# Patient Record
Sex: Female | Born: 1955
Health system: Southern US, Community
[De-identification: ages and names within clinical notes are randomized; demographics above are authoritative.]

## PROBLEM LIST (undated history)

## (undated) DIAGNOSIS — R9431 Abnormal electrocardiogram [ECG] [EKG]: Secondary | ICD-10-CM

## (undated) DIAGNOSIS — G931 Anoxic brain damage, not elsewhere classified: Secondary | ICD-10-CM

## (undated) DIAGNOSIS — F3170 Bipolar disorder, currently in remission, most recent episode unspecified: Secondary | ICD-10-CM

## (undated) DIAGNOSIS — H04129 Dry eye syndrome of unspecified lacrimal gland: Secondary | ICD-10-CM

## (undated) DIAGNOSIS — H3530 Unspecified macular degeneration: Secondary | ICD-10-CM

## (undated) DIAGNOSIS — I5021 Acute systolic (congestive) heart failure: Secondary | ICD-10-CM

## (undated) DIAGNOSIS — I5023 Acute on chronic systolic (congestive) heart failure: Secondary | ICD-10-CM

## (undated) DIAGNOSIS — D72829 Elevated white blood cell count, unspecified: Secondary | ICD-10-CM

## (undated) DIAGNOSIS — S069X9A Unspecified intracranial injury with loss of consciousness of unspecified duration, initial encounter: Secondary | ICD-10-CM

## (undated) DIAGNOSIS — D75839 Thrombocytosis, unspecified: Secondary | ICD-10-CM

## (undated) DIAGNOSIS — I421 Obstructive hypertrophic cardiomyopathy: Secondary | ICD-10-CM

## (undated) DIAGNOSIS — N189 Chronic kidney disease, unspecified: Secondary | ICD-10-CM

## (undated) DIAGNOSIS — J9600 Acute respiratory failure, unspecified whether with hypoxia or hypercapnia: Secondary | ICD-10-CM

## (undated) DIAGNOSIS — M549 Dorsalgia, unspecified: Secondary | ICD-10-CM

## (undated) DIAGNOSIS — I6389 Other cerebral infarction: Secondary | ICD-10-CM

## (undated) DIAGNOSIS — F319 Bipolar disorder, unspecified: Secondary | ICD-10-CM

## (undated) DIAGNOSIS — J9601 Acute respiratory failure with hypoxia: Secondary | ICD-10-CM

## (undated) DIAGNOSIS — I4901 Ventricular fibrillation: Secondary | ICD-10-CM

## (undated) DIAGNOSIS — I10 Essential (primary) hypertension: Secondary | ICD-10-CM

## (undated) DIAGNOSIS — G4733 Obstructive sleep apnea (adult) (pediatric): Secondary | ICD-10-CM

## (undated) DIAGNOSIS — N390 Urinary tract infection, site not specified: Secondary | ICD-10-CM

## (undated) DIAGNOSIS — R269 Unspecified abnormalities of gait and mobility: Secondary | ICD-10-CM

## (undated) DIAGNOSIS — R0600 Dyspnea, unspecified: Secondary | ICD-10-CM

## (undated) DIAGNOSIS — I639 Cerebral infarction, unspecified: Secondary | ICD-10-CM

## (undated) DIAGNOSIS — N179 Acute kidney failure, unspecified: Secondary | ICD-10-CM

## (undated) DIAGNOSIS — I6340 Cerebral infarction due to embolism of unspecified cerebral artery: Secondary | ICD-10-CM

## (undated) DIAGNOSIS — I469 Cardiac arrest, cause unspecified: Secondary | ICD-10-CM

## (undated) HISTORY — DX: Obstructive sleep apnea (adult) (pediatric): G47.33

## (undated) HISTORY — PX: ABDOMINAL HYSTERECTOMY: SHX81

## (undated) HISTORY — DX: Anoxic brain damage, not elsewhere classified: G93.1

## (undated) HISTORY — DX: Dorsalgia, unspecified: M54.9

## (undated) HISTORY — DX: Acute respiratory failure with hypoxia: J96.01

## (undated) HISTORY — DX: Cerebral infarction due to embolism of unspecified cerebral artery: I63.40

## (undated) HISTORY — DX: Acute systolic (congestive) heart failure: I50.21

## (undated) HISTORY — PX: TONSILLECTOMY: SUR1361

## (undated) HISTORY — DX: Bipolar disorder, currently in remission, most recent episode unspecified: F31.70

## (undated) HISTORY — DX: Acute on chronic systolic (congestive) heart failure: I50.23

## (undated) HISTORY — DX: Chronic kidney disease, unspecified: N18.9

## (undated) HISTORY — DX: Acute respiratory failure, unspecified whether with hypoxia or hypercapnia: J96.00

## (undated) HISTORY — DX: Other cerebral infarction: I63.89

## (undated) HISTORY — DX: Urinary tract infection, site not specified: N39.0

## (undated) HISTORY — DX: Unspecified abnormalities of gait and mobility: R26.9

## (undated) HISTORY — DX: Thrombocytosis, unspecified: D75.839

## (undated) HISTORY — DX: Essential (primary) hypertension: I10

## (undated) HISTORY — DX: Elevated white blood cell count, unspecified: D72.829

## (undated) HISTORY — DX: Acute kidney failure, unspecified: N17.9

## (undated) HISTORY — DX: Cerebral infarction, unspecified: I63.9

---

## 1998-06-05 ENCOUNTER — Other Ambulatory Visit: Admission: RE | Admit: 1998-06-05 | Discharge: 1998-06-05 | Payer: Self-pay | Admitting: *Deleted

## 1999-06-06 ENCOUNTER — Other Ambulatory Visit: Admission: RE | Admit: 1999-06-06 | Discharge: 1999-06-06 | Payer: Self-pay | Admitting: *Deleted

## 1999-10-28 ENCOUNTER — Encounter (INDEPENDENT_AMBULATORY_CARE_PROVIDER_SITE_OTHER): Payer: Self-pay

## 1999-10-28 ENCOUNTER — Inpatient Hospital Stay (HOSPITAL_COMMUNITY): Admission: RE | Admit: 1999-10-28 | Discharge: 1999-10-30 | Payer: Self-pay | Admitting: *Deleted

## 1999-12-31 ENCOUNTER — Ambulatory Visit (HOSPITAL_BASED_OUTPATIENT_CLINIC_OR_DEPARTMENT_OTHER): Admission: RE | Admit: 1999-12-31 | Discharge: 1999-12-31 | Payer: Self-pay | Admitting: Urology

## 2000-12-16 ENCOUNTER — Other Ambulatory Visit: Admission: RE | Admit: 2000-12-16 | Discharge: 2000-12-16 | Payer: Self-pay | Admitting: *Deleted

## 2001-12-16 ENCOUNTER — Other Ambulatory Visit: Admission: RE | Admit: 2001-12-16 | Discharge: 2001-12-16 | Payer: Self-pay | Admitting: *Deleted

## 2003-01-24 ENCOUNTER — Other Ambulatory Visit: Admission: RE | Admit: 2003-01-24 | Discharge: 2003-01-24 | Payer: Self-pay | Admitting: *Deleted

## 2004-03-20 ENCOUNTER — Other Ambulatory Visit: Admission: RE | Admit: 2004-03-20 | Discharge: 2004-03-20 | Payer: Self-pay | Admitting: *Deleted

## 2004-03-25 ENCOUNTER — Emergency Department (HOSPITAL_COMMUNITY): Admission: EM | Admit: 2004-03-25 | Discharge: 2004-03-25 | Payer: Self-pay | Admitting: Emergency Medicine

## 2005-06-09 ENCOUNTER — Other Ambulatory Visit: Admission: RE | Admit: 2005-06-09 | Discharge: 2005-06-09 | Payer: Self-pay | Admitting: *Deleted

## 2006-09-02 ENCOUNTER — Other Ambulatory Visit: Admission: RE | Admit: 2006-09-02 | Discharge: 2006-09-02 | Payer: Self-pay | Admitting: Gynecology

## 2007-10-19 ENCOUNTER — Other Ambulatory Visit: Admission: RE | Admit: 2007-10-19 | Discharge: 2007-10-19 | Payer: Self-pay | Admitting: *Deleted

## 2009-07-07 DIAGNOSIS — S069XAA Unspecified intracranial injury with loss of consciousness status unknown, initial encounter: Secondary | ICD-10-CM

## 2009-07-07 DIAGNOSIS — S069X9A Unspecified intracranial injury with loss of consciousness of unspecified duration, initial encounter: Secondary | ICD-10-CM

## 2009-07-07 HISTORY — DX: Unspecified intracranial injury with loss of consciousness of unspecified duration, initial encounter: S06.9X9A

## 2009-07-07 HISTORY — DX: Unspecified intracranial injury with loss of consciousness status unknown, initial encounter: S06.9XAA

## 2009-07-07 HISTORY — PX: SPLENECTOMY, TOTAL: SHX788

## 2010-11-22 NOTE — Op Note (Signed)
[place]  Patient:    [name], Marie Ramos               MRN: [id number] Proc. Date: 12/31/99 Adm. Date:  [id number] Attending:  Lindaann Slough                           Operative Report  PREOPERATIVE DIAGNOSIS:  Recurrent urinary tract infection rule out interstitial cystitis.  POSTOPERATIVE DIAGNOSIS:  Recurrent urinary tract infection.  OPERATION:  Cystoscopy and urethral dilation.  SURGEON:  Lindaann Slough, M.D.  ANESTHESIA:  General.  INDICATIONS:  The patient is a 55 year old female who has been complaining of frequency, hesitancy, ______ pain.  She was treated with antibiotics without any improvement of her symptoms.  She had a hysterectomy about 2 months ago and her symptoms started after the hysterectomy.  She is scheduled now for cystoscopy.  DESCRIPTION OF PROCEDURE:  Under general anesthesia, the patient was prepped and draped and placed in the dorsal lithotomy position.  A #21 Wappler cystoscope was inserted into the bladder.  The bladder mucosa is normal. There is no stone or tumor in the bladder.  The ureteral orifices are in normal position and shape with clear efflux.  There was no evidence of submucosal hemorrhage.  The cystoscope was then removed.  The urethra was dilated with #30 [place].  Then a bimanual pelvic examination was done and there was no evidence of pelvic mass.  The patient tolerated the procedure well and left the operating room in satisfactory condition to the post anesthesia care unit. DD:  12/31/99 TD:  01/01/00 Job: [id number]

## 2015-08-08 DIAGNOSIS — I6340 Cerebral infarction due to embolism of unspecified cerebral artery: Secondary | ICD-10-CM

## 2015-08-08 HISTORY — DX: Cerebral infarction due to embolism of unspecified cerebral artery: I63.40

## 2015-08-24 ENCOUNTER — Encounter (HOSPITAL_COMMUNITY): Payer: Self-pay | Admitting: Cardiovascular Disease

## 2015-08-24 ENCOUNTER — Encounter (HOSPITAL_COMMUNITY)
Admission: EM | Disposition: A | Discharge status: Rehabilitation Facility | Payer: Self-pay | Source: Home / Self Care | Attending: Internal Medicine

## 2015-08-24 ENCOUNTER — Inpatient Hospital Stay (HOSPITAL_COMMUNITY): Payer: BLUE CROSS/BLUE SHIELD

## 2015-08-24 ENCOUNTER — Inpatient Hospital Stay (HOSPITAL_COMMUNITY)
Admission: EM | Admit: 2015-08-24 | Discharge: 2015-09-05 | DRG: 224 | Disposition: A | Discharge status: Rehabilitation Facility | Payer: BLUE CROSS/BLUE SHIELD | Attending: Internal Medicine | Admitting: Internal Medicine

## 2015-08-24 ENCOUNTER — Emergency Department (HOSPITAL_COMMUNITY): Payer: BLUE CROSS/BLUE SHIELD

## 2015-08-24 DIAGNOSIS — E1165 Type 2 diabetes mellitus with hyperglycemia: Secondary | ICD-10-CM | POA: Diagnosis present

## 2015-08-24 DIAGNOSIS — J9600 Acute respiratory failure, unspecified whether with hypoxia or hypercapnia: Secondary | ICD-10-CM

## 2015-08-24 DIAGNOSIS — R05 Cough: Secondary | ICD-10-CM | POA: Diagnosis not present

## 2015-08-24 DIAGNOSIS — I5043 Acute on chronic combined systolic (congestive) and diastolic (congestive) heart failure: Secondary | ICD-10-CM | POA: Diagnosis not present

## 2015-08-24 DIAGNOSIS — B9620 Unspecified Escherichia coli [E. coli] as the cause of diseases classified elsewhere: Secondary | ICD-10-CM | POA: Diagnosis not present

## 2015-08-24 DIAGNOSIS — I4891 Unspecified atrial fibrillation: Secondary | ICD-10-CM | POA: Diagnosis present

## 2015-08-24 DIAGNOSIS — E876 Hypokalemia: Secondary | ICD-10-CM | POA: Diagnosis present

## 2015-08-24 DIAGNOSIS — I248 Other forms of acute ischemic heart disease: Secondary | ICD-10-CM | POA: Diagnosis present

## 2015-08-24 DIAGNOSIS — J9601 Acute respiratory failure with hypoxia: Secondary | ICD-10-CM | POA: Diagnosis present

## 2015-08-24 DIAGNOSIS — I4581 Long QT syndrome: Secondary | ICD-10-CM | POA: Diagnosis present

## 2015-08-24 DIAGNOSIS — F3170 Bipolar disorder, currently in remission, most recent episode unspecified: Secondary | ICD-10-CM | POA: Diagnosis present

## 2015-08-24 DIAGNOSIS — Z79899 Other long term (current) drug therapy: Secondary | ICD-10-CM

## 2015-08-24 DIAGNOSIS — I462 Cardiac arrest due to underlying cardiac condition: Secondary | ICD-10-CM | POA: Diagnosis present

## 2015-08-24 DIAGNOSIS — Z87820 Personal history of traumatic brain injury: Secondary | ICD-10-CM | POA: Diagnosis not present

## 2015-08-24 DIAGNOSIS — I10 Essential (primary) hypertension: Secondary | ICD-10-CM | POA: Diagnosis not present

## 2015-08-24 DIAGNOSIS — D72829 Elevated white blood cell count, unspecified: Secondary | ICD-10-CM | POA: Diagnosis present

## 2015-08-24 DIAGNOSIS — Z978 Presence of other specified devices: Secondary | ICD-10-CM

## 2015-08-24 DIAGNOSIS — I82492 Acute embolism and thrombosis of other specified deep vein of left lower extremity: Secondary | ICD-10-CM | POA: Diagnosis not present

## 2015-08-24 DIAGNOSIS — R5381 Other malaise: Secondary | ICD-10-CM | POA: Diagnosis not present

## 2015-08-24 DIAGNOSIS — E785 Hyperlipidemia, unspecified: Secondary | ICD-10-CM | POA: Diagnosis present

## 2015-08-24 DIAGNOSIS — I472 Ventricular tachycardia: Secondary | ICD-10-CM | POA: Diagnosis present

## 2015-08-24 DIAGNOSIS — R059 Cough, unspecified: Secondary | ICD-10-CM

## 2015-08-24 DIAGNOSIS — F319 Bipolar disorder, unspecified: Secondary | ICD-10-CM | POA: Diagnosis present

## 2015-08-24 DIAGNOSIS — I422 Other hypertrophic cardiomyopathy: Secondary | ICD-10-CM | POA: Diagnosis not present

## 2015-08-24 DIAGNOSIS — I639 Cerebral infarction, unspecified: Secondary | ICD-10-CM | POA: Diagnosis not present

## 2015-08-24 DIAGNOSIS — I5021 Acute systolic (congestive) heart failure: Secondary | ICD-10-CM | POA: Diagnosis not present

## 2015-08-24 DIAGNOSIS — F419 Anxiety disorder, unspecified: Secondary | ICD-10-CM | POA: Diagnosis not present

## 2015-08-24 DIAGNOSIS — I421 Obstructive hypertrophic cardiomyopathy: Secondary | ICD-10-CM | POA: Diagnosis not present

## 2015-08-24 DIAGNOSIS — Z888 Allergy status to other drugs, medicaments and biological substances status: Secondary | ICD-10-CM | POA: Diagnosis not present

## 2015-08-24 DIAGNOSIS — S060XAA Concussion with loss of consciousness status unknown, initial encounter: Secondary | ICD-10-CM | POA: Diagnosis present

## 2015-08-24 DIAGNOSIS — I4901 Ventricular fibrillation: Principal | ICD-10-CM | POA: Diagnosis present

## 2015-08-24 DIAGNOSIS — D7589 Other specified diseases of blood and blood-forming organs: Secondary | ICD-10-CM | POA: Diagnosis present

## 2015-08-24 DIAGNOSIS — Z87891 Personal history of nicotine dependence: Secondary | ICD-10-CM

## 2015-08-24 DIAGNOSIS — Z6834 Body mass index (BMI) 34.0-34.9, adult: Secondary | ICD-10-CM | POA: Diagnosis not present

## 2015-08-24 DIAGNOSIS — I5023 Acute on chronic systolic (congestive) heart failure: Secondary | ICD-10-CM | POA: Diagnosis present

## 2015-08-24 DIAGNOSIS — D473 Essential (hemorrhagic) thrombocythemia: Secondary | ICD-10-CM | POA: Diagnosis present

## 2015-08-24 DIAGNOSIS — E872 Acidosis: Secondary | ICD-10-CM | POA: Diagnosis not present

## 2015-08-24 DIAGNOSIS — Z950 Presence of cardiac pacemaker: Secondary | ICD-10-CM

## 2015-08-24 DIAGNOSIS — N179 Acute kidney failure, unspecified: Secondary | ICD-10-CM | POA: Diagnosis present

## 2015-08-24 DIAGNOSIS — I110 Hypertensive heart disease with heart failure: Secondary | ICD-10-CM | POA: Diagnosis present

## 2015-08-24 DIAGNOSIS — I959 Hypotension, unspecified: Secondary | ICD-10-CM | POA: Diagnosis present

## 2015-08-24 DIAGNOSIS — I82442 Acute embolism and thrombosis of left tibial vein: Secondary | ICD-10-CM | POA: Diagnosis not present

## 2015-08-24 DIAGNOSIS — N390 Urinary tract infection, site not specified: Secondary | ICD-10-CM | POA: Diagnosis not present

## 2015-08-24 DIAGNOSIS — D75839 Thrombocytosis, unspecified: Secondary | ICD-10-CM | POA: Diagnosis present

## 2015-08-24 DIAGNOSIS — G9340 Encephalopathy, unspecified: Secondary | ICD-10-CM | POA: Diagnosis not present

## 2015-08-24 DIAGNOSIS — R7989 Other specified abnormal findings of blood chemistry: Secondary | ICD-10-CM | POA: Diagnosis not present

## 2015-08-24 DIAGNOSIS — I469 Cardiac arrest, cause unspecified: Secondary | ICD-10-CM

## 2015-08-24 DIAGNOSIS — R269 Unspecified abnormalities of gait and mobility: Secondary | ICD-10-CM | POA: Diagnosis not present

## 2015-08-24 DIAGNOSIS — G931 Anoxic brain damage, not elsewhere classified: Secondary | ICD-10-CM

## 2015-08-24 DIAGNOSIS — R9431 Abnormal electrocardiogram [ECG] [EKG]: Secondary | ICD-10-CM | POA: Diagnosis present

## 2015-08-24 DIAGNOSIS — Z959 Presence of cardiac and vascular implant and graft, unspecified: Secondary | ICD-10-CM

## 2015-08-24 DIAGNOSIS — M549 Dorsalgia, unspecified: Secondary | ICD-10-CM | POA: Diagnosis not present

## 2015-08-24 DIAGNOSIS — I6340 Cerebral infarction due to embolism of unspecified cerebral artery: Secondary | ICD-10-CM | POA: Diagnosis not present

## 2015-08-24 DIAGNOSIS — I638 Other cerebral infarction: Secondary | ICD-10-CM | POA: Diagnosis not present

## 2015-08-24 HISTORY — DX: Ventricular fibrillation: I49.01

## 2015-08-24 HISTORY — PX: CARDIAC CATHETERIZATION: SHX172

## 2015-08-24 HISTORY — DX: Abnormal electrocardiogram (ECG) (EKG): R94.31

## 2015-08-24 HISTORY — DX: Dry eye syndrome of unspecified lacrimal gland: H04.129

## 2015-08-24 HISTORY — DX: Unspecified intracranial injury with loss of consciousness of unspecified duration, initial encounter: S06.9X9A

## 2015-08-24 HISTORY — DX: Cardiac arrest, cause unspecified: I46.9

## 2015-08-24 HISTORY — DX: Obstructive hypertrophic cardiomyopathy: I42.1

## 2015-08-24 HISTORY — DX: Bipolar disorder, unspecified: F31.9

## 2015-08-24 HISTORY — DX: Unspecified macular degeneration: H35.30

## 2015-08-24 LAB — GLUCOSE, CAPILLARY
GLUCOSE-CAPILLARY: 120 mg/dL — AB (ref 65–99)
GLUCOSE-CAPILLARY: 129 mg/dL — AB (ref 65–99)
Glucose-Capillary: 133 mg/dL — ABNORMAL HIGH (ref 65–99)
Glucose-Capillary: 101 mg/dL — ABNORMAL HIGH (ref 65–99)
Glucose-Capillary: 87 mg/dL (ref 65–99)
Glucose-Capillary: 92 mg/dL (ref 65–99)

## 2015-08-24 LAB — POCT I-STAT, CHEM 8
BUN: 22 mg/dL — AB (ref 6–20)
BUN: 21 mg/dL — AB (ref 6–20)
BUN: 21 mg/dL — ABNORMAL HIGH (ref 6–20)
CHLORIDE: 108 mmol/L (ref 101–111)
CHLORIDE: 109 mmol/L (ref 101–111)
CREATININE: 0.7 mg/dL (ref 0.44–1.00)
Calcium, Ion: 1.05 mmol/L — ABNORMAL LOW (ref 1.13–1.30)
Calcium, Ion: 1.05 mmol/L — ABNORMAL LOW (ref 1.13–1.30)
Calcium, Ion: 1 mmol/L — ABNORMAL LOW (ref 1.13–1.30)
Chloride: 113 mmol/L — ABNORMAL HIGH (ref 101–111)
Creatinine, Ser: 0.8 mg/dL (ref 0.44–1.00)
Creatinine, Ser: 0.7 mg/dL (ref 0.44–1.00)
GLUCOSE: 139 mg/dL — AB (ref 65–99)
GLUCOSE: 131 mg/dL — AB (ref 65–99)
Glucose, Bld: 98 mg/dL (ref 65–99)
HCT: 40 % (ref 36.0–46.0)
HCT: 44 % (ref 36.0–46.0)
HEMATOCRIT: 41 % (ref 36.0–46.0)
HEMOGLOBIN: 15 g/dL (ref 12.0–15.0)
HEMOGLOBIN: 13.9 g/dL (ref 12.0–15.0)
Hemoglobin: 13.6 g/dL (ref 12.0–15.0)
POTASSIUM: 2.8 mmol/L — AB (ref 3.5–5.1)
POTASSIUM: 4 mmol/L (ref 3.5–5.1)
POTASSIUM: 3.5 mmol/L (ref 3.5–5.1)
SODIUM: 146 mmol/L — AB (ref 135–145)
Sodium: 147 mmol/L — ABNORMAL HIGH (ref 135–145)
Sodium: 147 mmol/L — ABNORMAL HIGH (ref 135–145)
TCO2: 24 mmol/L (ref 0–100)
TCO2: 23 mmol/L (ref 0–100)
TCO2: 23 mmol/L (ref 0–100)

## 2015-08-24 LAB — CBC
HCT: 39 % (ref 36.0–46.0)
Hemoglobin: 12.6 g/dL (ref 12.0–15.0)
MCH: 29.6 pg (ref 26.0–34.0)
MCHC: 32.3 g/dL (ref 30.0–36.0)
MCV: 91.5 fL (ref 78.0–100.0)
PLATELETS: 524 10*3/uL — AB (ref 150–400)
RBC: 4.26 MIL/uL (ref 3.87–5.11)
RDW: 15 % (ref 11.5–15.5)
WBC: 30.9 10*3/uL — ABNORMAL HIGH (ref 4.0–10.5)

## 2015-08-24 LAB — I-STAT CHEM 8, ED
BUN: 27 mg/dL — AB (ref 6–20)
CALCIUM ION: 1.07 mmol/L — AB (ref 1.13–1.30)
CREATININE: 1.2 mg/dL — AB (ref 0.44–1.00)
Chloride: 103 mmol/L (ref 101–111)
GLUCOSE: 270 mg/dL — AB (ref 65–99)
HEMATOCRIT: 44 % (ref 36.0–46.0)
HEMOGLOBIN: 15 g/dL (ref 12.0–15.0)
Potassium: 3.2 mmol/L — ABNORMAL LOW (ref 3.5–5.1)
Sodium: 139 mmol/L (ref 135–145)
TCO2: 21 mmol/L (ref 0–100)

## 2015-08-24 LAB — PROCALCITONIN: Procalcitonin: 0.21 ng/mL

## 2015-08-24 LAB — POCT I-STAT 3, ART BLOOD GAS (G3+)
Acid-base deficit: 7 mmol/L — ABNORMAL HIGH (ref 0.0–2.0)
Bicarbonate: 22 mEq/L (ref 20.0–24.0)
O2 SAT: 99 %
PCO2 ART: 53.4 mmHg — AB (ref 35.0–45.0)
PO2 ART: 193 mmHg — AB (ref 80.0–100.0)
Patient temperature: 35.5
TCO2: 24 mmol/L (ref 0–100)
pH, Arterial: 7.214 — ABNORMAL LOW (ref 7.350–7.450)

## 2015-08-24 LAB — COMPREHENSIVE METABOLIC PANEL
ALBUMIN: 3.5 g/dL (ref 3.5–5.0)
ALT: 73 U/L — ABNORMAL HIGH (ref 14–54)
ANION GAP: 13 (ref 5–15)
AST: 94 U/L — ABNORMAL HIGH (ref 15–41)
Alkaline Phosphatase: 123 U/L (ref 38–126)
BUN: 23 mg/dL — ABNORMAL HIGH (ref 6–20)
CALCIUM: 9.1 mg/dL (ref 8.9–10.3)
CO2: 21 mmol/L — AB (ref 22–32)
Chloride: 105 mmol/L (ref 101–111)
Creatinine, Ser: 1.24 mg/dL — ABNORMAL HIGH (ref 0.44–1.00)
GFR calc non Af Amer: 46 mL/min — ABNORMAL LOW (ref >60)
GFR, EST AFRICAN AMERICAN: 54 mL/min — AB (ref >60)
GLUCOSE: 277 mg/dL — AB (ref 65–99)
POTASSIUM: 3.3 mmol/L — AB (ref 3.5–5.1)
SODIUM: 139 mmol/L (ref 135–145)
Total Bilirubin: 0.4 mg/dL (ref 0.3–1.2)
Total Protein: 6.8 g/dL (ref 6.5–8.1)

## 2015-08-24 LAB — I-STAT TROPONIN, ED: TROPONIN I, POC: 0.11 ng/mL — AB (ref 0.00–0.08)

## 2015-08-24 LAB — BASIC METABOLIC PANEL
Anion gap: 11 (ref 5–15)
BUN: 20 mg/dL (ref 6–20)
CHLORIDE: 112 mmol/L — AB (ref 101–111)
CO2: 22 mmol/L (ref 22–32)
CREATININE: 0.88 mg/dL (ref 0.44–1.00)
Calcium: 7.4 mg/dL — ABNORMAL LOW (ref 8.9–10.3)
GFR calc Af Amer: >60 mL/min (ref >60)
GFR calc non Af Amer: >60 mL/min (ref >60)
GLUCOSE: 141 mg/dL — AB (ref 65–99)
Potassium: 2.8 mmol/L — ABNORMAL LOW (ref 3.5–5.1)
Sodium: 145 mmol/L (ref 135–145)

## 2015-08-24 LAB — CBG MONITORING, ED: Glucose-Capillary: 194 mg/dL — ABNORMAL HIGH (ref 65–99)

## 2015-08-24 LAB — MAGNESIUM: Magnesium: 2.2 mg/dL (ref 1.7–2.4)

## 2015-08-24 LAB — TROPONIN I: Troponin I: 1.16 ng/mL (ref <0.031)

## 2015-08-24 LAB — PROTIME-INR
INR: 1.27 (ref 0.00–1.49)
INR: 1.13 (ref 0.00–1.49)
Prothrombin Time: 16 seconds — ABNORMAL HIGH (ref 11.6–15.2)
Prothrombin Time: 14.7 seconds (ref 11.6–15.2)

## 2015-08-24 LAB — APTT
APTT: 90 s — AB (ref 24–37)
APTT: 25 s (ref 24–37)

## 2015-08-24 LAB — PHOSPHORUS: PHOSPHORUS: 4.1 mg/dL (ref 2.5–4.6)

## 2015-08-24 SURGERY — LEFT HEART CATH AND CORONARY ANGIOGRAPHY

## 2015-08-24 MED ORDER — LIDOCAINE HCL (PF) 1 % IJ SOLN
INTRAMUSCULAR | Status: AC
  Filled 2015-08-24: qty 30

## 2015-08-24 MED ORDER — SODIUM CHLORIDE 0.9 % IV SOLN: INTRAVENOUS | Status: AC

## 2015-08-24 MED ORDER — LIDOCAINE HCL (PF) 1 % IJ SOLN
INTRAMUSCULAR | Status: DC | PRN
Start: 2015-08-24 — End: 2015-08-24
  Administered 2015-08-24: 3 mL via SUBCUTANEOUS

## 2015-08-24 MED ORDER — FENTANYL CITRATE (PF) 100 MCG/2ML IJ SOLN
INTRAMUSCULAR | Status: DC | PRN
  Administered 2015-08-24: 100 ug via INTRAVENOUS

## 2015-08-24 MED ORDER — FENTANYL CITRATE (PF) 100 MCG/2ML IJ SOLN
100.0000 ug | INTRAMUSCULAR | Status: DC | PRN
  Administered 2015-08-24: 100 ug via INTRAVENOUS
  Filled 2015-08-24: qty 2

## 2015-08-24 MED ORDER — IOHEXOL 350 MG/ML SOLN
INTRAVENOUS | Status: DC | PRN
Start: 2015-08-24 — End: 2015-08-24
  Administered 2015-08-24: 40 mL via INTRA_ARTERIAL

## 2015-08-24 MED ORDER — SODIUM CHLORIDE 0.9 % IV SOLN
2000.0000 mL | Freq: Once | INTRAVENOUS | Status: AC
  Administered 2015-08-24: 1000 mL via INTRAVENOUS

## 2015-08-24 MED ORDER — SODIUM CHLORIDE 0.9 % IV SOLN
25.0000 ug/h | INTRAVENOUS | Status: DC
  Administered 2015-08-24: 100 ug/h via INTRAVENOUS
  Administered 2015-08-25 (×2): 300 ug/h via INTRAVENOUS
  Filled 2015-08-24 (×4): qty 50

## 2015-08-24 MED ORDER — MIDAZOLAM BOLUS VIA INFUSION
2.0000 mg | INTRAVENOUS | Status: DC | PRN
  Filled 2015-08-24: qty 2

## 2015-08-24 MED ORDER — HEPARIN (PORCINE) IN NACL 2-0.9 UNIT/ML-% IJ SOLN
INTRAMUSCULAR | Status: AC
  Filled 2015-08-24: qty 1500

## 2015-08-24 MED ORDER — MIDAZOLAM HCL 2 MG/2ML IJ SOLN: 2.0000 mg | Freq: Once | INTRAMUSCULAR | Status: AC

## 2015-08-24 MED ORDER — FENTANYL CITRATE (PF) 100 MCG/2ML IJ SOLN: 100.0000 ug | INTRAMUSCULAR | Status: DC | PRN

## 2015-08-24 MED ORDER — NOREPINEPHRINE BITARTRATE 1 MG/ML IV SOLN
0.0000 ug/min | INTRAVENOUS | Status: DC
  Administered 2015-08-25: 5 ug/min via INTRAVENOUS
  Filled 2015-08-24 (×2): qty 4

## 2015-08-24 MED ORDER — POTASSIUM CHLORIDE 10 MEQ/100ML IV SOLN
10.0000 meq | INTRAVENOUS | Status: AC
  Administered 2015-08-24 (×5): 10 meq via INTRAVENOUS
  Filled 2015-08-24: qty 100

## 2015-08-24 MED ORDER — FENTANYL BOLUS VIA INFUSION
50.0000 ug | INTRAVENOUS | Status: DC | PRN
  Filled 2015-08-24: qty 50

## 2015-08-24 MED ORDER — INSULIN ASPART 100 UNIT/ML ~~LOC~~ SOLN
2.0000 [IU] | SUBCUTANEOUS | Status: DC
  Administered 2015-08-24: 2 [IU] via SUBCUTANEOUS
  Administered 2015-08-28: 4 [IU] via SUBCUTANEOUS
  Administered 2015-08-28: 3 [IU] via SUBCUTANEOUS
  Administered 2015-08-29 (×2): 2 [IU] via SUBCUTANEOUS
  Administered 2015-08-30: 4 [IU] via SUBCUTANEOUS
  Administered 2015-08-31: 6 [IU] via SUBCUTANEOUS
  Administered 2015-08-31 – 2015-09-04 (×4): 2 [IU] via SUBCUTANEOUS
  Administered 2015-09-04: 4 [IU] via SUBCUTANEOUS
  Administered 2015-09-04 – 2015-09-05 (×2): 2 [IU] via SUBCUTANEOUS

## 2015-08-24 MED ORDER — CISATRACURIUM BESYLATE (PF) 200 MG/20ML IV SOLN
1.0000 ug/kg/min | INTRAVENOUS | Status: DC
  Administered 2015-08-24 – 2015-08-25 (×2): 1 ug/kg/min via INTRAVENOUS
  Filled 2015-08-24 (×2): qty 20

## 2015-08-24 MED ORDER — LABETALOL HCL 5 MG/ML IV SOLN
INTRAVENOUS | Status: AC
  Filled 2015-08-24: qty 4

## 2015-08-24 MED ORDER — VERAPAMIL HCL 2.5 MG/ML IV SOLN
INTRAVENOUS | Status: AC
  Filled 2015-08-24: qty 2

## 2015-08-24 MED ORDER — ASPIRIN 300 MG RE SUPP
300.0000 mg | RECTAL | Status: AC
  Administered 2015-08-24: 300 mg via RECTAL
  Filled 2015-08-24: qty 1

## 2015-08-24 MED ORDER — HEPARIN (PORCINE) IN NACL 2-0.9 UNIT/ML-% IJ SOLN
INTRAMUSCULAR | Status: DC | PRN
  Administered 2015-08-24: 1500 mL

## 2015-08-24 MED ORDER — ARTIFICIAL TEARS OP OINT
1.0000 "application " | TOPICAL_OINTMENT | Freq: Three times a day (TID) | OPHTHALMIC | Status: DC
  Administered 2015-08-24 – 2015-08-26 (×6): 1 via OPHTHALMIC
  Filled 2015-08-24: qty 3.5

## 2015-08-24 MED ORDER — CISATRACURIUM BOLUS VIA INFUSION
0.1000 mg/kg | INTRAVENOUS | Status: AC
  Administered 2015-08-24: 9.9 mg via INTRAVENOUS
  Filled 2015-08-24: qty 10

## 2015-08-24 MED ORDER — SODIUM CHLORIDE 0.9 % IV SOLN
INTRAVENOUS | Status: DC
  Administered 2015-08-24: 999 mL/h via INTRAVENOUS

## 2015-08-24 MED ORDER — SODIUM CHLORIDE 0.9 % IV SOLN
INTRAVENOUS | Status: DC
  Administered 2015-08-24 – 2015-08-27 (×5): via INTRAVENOUS

## 2015-08-24 MED ORDER — PANTOPRAZOLE SODIUM 40 MG IV SOLR
40.0000 mg | INTRAVENOUS | Status: DC
  Administered 2015-08-24 – 2015-08-25 (×2): 40 mg via INTRAVENOUS
  Filled 2015-08-24 (×2): qty 40

## 2015-08-24 MED ORDER — ETOMIDATE 2 MG/ML IV SOLN
INTRAVENOUS | Status: AC | PRN
  Administered 2015-08-24: 30 mg via INTRAVENOUS

## 2015-08-24 MED ORDER — HEPARIN SODIUM (PORCINE) 1000 UNIT/ML IJ SOLN
INTRAMUSCULAR | Status: DC | PRN
  Administered 2015-08-24: 5000 [IU] via INTRAVENOUS

## 2015-08-24 MED ORDER — SODIUM CHLORIDE 0.9 % IV SOLN: 250.0000 mL | INTRAVENOUS | Status: DC | PRN

## 2015-08-24 MED ORDER — FENTANYL CITRATE (PF) 100 MCG/2ML IJ SOLN
100.0000 ug | Freq: Once | INTRAMUSCULAR | Status: AC
  Filled 2015-08-24: qty 2

## 2015-08-24 MED ORDER — HEPARIN SODIUM (PORCINE) 1000 UNIT/ML IJ SOLN
INTRAMUSCULAR | Status: AC
  Filled 2015-08-24: qty 1

## 2015-08-24 MED ORDER — SODIUM CHLORIDE 0.9 % IV SOLN
1.0000 mg/h | INTRAVENOUS | Status: DC
  Administered 2015-08-24: 2 mg/h via INTRAVENOUS
  Administered 2015-08-25 – 2015-08-26 (×3): 4 mg/h via INTRAVENOUS
  Filled 2015-08-24 (×4): qty 10

## 2015-08-24 MED ORDER — VERAPAMIL HCL 2.5 MG/ML IV SOLN
INTRAVENOUS | Status: DC | PRN
  Administered 2015-08-24: 16:00:00 via INTRA_ARTERIAL

## 2015-08-24 MED ORDER — LABETALOL HCL 5 MG/ML IV SOLN
INTRAVENOUS | Status: DC | PRN
  Administered 2015-08-24: 20 mg via INTRAVENOUS

## 2015-08-24 MED ORDER — MIDAZOLAM HCL 2 MG/2ML IJ SOLN
2.0000 mg | INTRAMUSCULAR | Status: DC | PRN
  Filled 2015-08-24: qty 2

## 2015-08-24 MED ORDER — SODIUM CHLORIDE 0.9% FLUSH: 3.0000 mL | INTRAVENOUS | Status: DC | PRN

## 2015-08-24 MED ORDER — CISATRACURIUM BOLUS VIA INFUSION
0.0500 mg/kg | INTRAVENOUS | Status: DC | PRN
  Filled 2015-08-24: qty 5

## 2015-08-24 MED ORDER — HEPARIN SODIUM (PORCINE) 5000 UNIT/ML IJ SOLN
5000.0000 [IU] | Freq: Three times a day (TID) | INTRAMUSCULAR | Status: DC
  Administered 2015-08-24 – 2015-09-01 (×25): 5000 [IU] via SUBCUTANEOUS
  Filled 2015-08-24 (×21): qty 1

## 2015-08-24 MED ORDER — SODIUM CHLORIDE 0.9% FLUSH
3.0000 mL | Freq: Two times a day (BID) | INTRAVENOUS | Status: DC
Start: 2015-08-24 — End: 2015-09-04
  Administered 2015-08-24 – 2015-09-02 (×13): 3 mL via INTRAVENOUS

## 2015-08-24 MED ORDER — MIDAZOLAM HCL 2 MG/2ML IJ SOLN
2.0000 mg | INTRAMUSCULAR | Status: DC | PRN
  Administered 2015-08-24: 2 mg via INTRAVENOUS

## 2015-08-24 MED ORDER — MAGNESIUM SULFATE 2 GM/50ML IV SOLN
2.0000 g | Freq: Once | INTRAVENOUS | Status: DC
  Administered 2015-08-24: 2 g via INTRAVENOUS
  Filled 2015-08-24: qty 50

## 2015-08-24 MED ORDER — ROCURONIUM BROMIDE 50 MG/5ML IV SOLN
INTRAVENOUS | Status: AC | PRN
  Administered 2015-08-24: 100 mg via INTRAVENOUS

## 2015-08-24 SURGICAL SUPPLY — 12 items

## 2015-08-24 NOTE — ED Notes (Signed)
Witnessed arrest.  Pt was in [employer] and lost consciousness.  School RN beside her immediately started CPR and placed pt on AED.  Pt was defibed with AED x 2.  When EMS arrived 5 min later pt was PEA 80.  Regained pulses after 1 epi - SVT 180's.  4 syncronized - pt converted to nsr at 360 jules.  CBG 194.  EMS had originally been called for seizure.  Upon arrival pt hypertensive and maintaining airway with nasal trumptes.Andre Lefort.  Pt began posturing with both arms and snoring RR.

## 2015-08-24 NOTE — Progress Notes (Signed)
Attempted arterial line to the left radial artery. Was unable to thread catheter into artery. Pressure was held for five minutes with the two attempts. No hematoma.

## 2015-08-24 NOTE — Progress Notes (Signed)
Attempted arterial line to the left radial artery. Was unable to thread catheter. Got back good blood return. Pressure was held to stop bleeding. No hematoma noted. RN aware

## 2015-08-24 NOTE — ED Notes (Signed)
Pharmacy aware of need for meds.  Pads placed.  Ice packs to be placed after central line.  Husband at bedside to be taken to CCU waiting room.

## 2015-08-24 NOTE — ED Provider Notes (Signed)
CSN: [id number]     Arrival date & time 08/24/15  1301 History   First MD Initiated Contact with Patient 08/24/15 1304     Chief Complaint  Patient presents with  . Cardiac Arrest   HPI   Patient is a 60 year old female who presents via EMS for cardiac arrest. 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 School nurse initially started CPR and place AED. Patient was shocked 2 times at school before EMS arrival. Upon EMS arrival patient in [place]. She is given 1 dose of epi and was synchronized cardioverted 4 times. Blood sugar 194.  Upon arrival patient unresponsive with snoring respirations. She was posturing with both arms. Her respiratory rate was 28 her blood pressure was 157/84.  Level V caveat due to patient's condition of unresponsive.  Past Medical History  Diagnosis Date  . Bipolar 1 disorder ([place])    No past surgical history on file. No family history on file. Social History  Substance Use Topics  . Smoking status: Not on file  . Smokeless tobacco: Not on file  . Alcohol Use: Not on file   OB History    No data available     Review of Systems  Unable to perform ROS: Patient unresponsive    Allergies  Review of patient's allergies indicates not on file.  Home Medications   Prior to Admission medications   Not on File   BP 176/109 mmHg  Pulse 101  Temp(Src) 97.7 F (36.5 C) (Core (Comment))  Resp 16  Wt 98.884 kg  SpO2 100% Physical Exam  Constitutional: She appears well-nourished. She appears distressed.  HENT:  Head: Normocephalic and atraumatic.  Eyes: Conjunctivae are normal. Right eye exhibits no discharge. Left eye exhibits no discharge. No scleral icterus.  Neck: No JVD present. No tracheal deviation present.  Cardiovascular: Normal rate, regular rhythm, normal heart sounds and intact distal pulses.  Exam reveals no friction rub.   No murmur heard. Pulmonary/Chest: She is in respiratory distress. She has no wheezes.   Abdominal: Soft. She exhibits distension. There is no tenderness. There is no rebound and no guarding.  Musculoskeletal: She exhibits no edema.  Lymphadenopathy:    She has no cervical adenopathy.  Neurological: GCS eye subscore is 1. GCS verbal subscore is 2. GCS motor subscore is 3.  Nursing note and vitals reviewed.   ED Course  .Intubation Date/Time: 08/24/2015 2:45 PM Performed by: Vira Blanco Authorized by: Lajean Saver Consent: The procedure was performed in an emergent situation. Indications: respiratory distress Intubation method: video-assisted Patient status: paralyzed (RSI) Preoxygenation: BVM Sedatives: etomidate Paralytic: rocuronium Laryngoscope size: Miller 3 Tube size: 7.5 mm Tube type: cuffed Number of attempts: 2 Ventilation between attempts: BVM Cricoid pressure: no Cords visualized: yes Post-procedure assessment: chest rise,  ETCO2 monitor and CO2 detector Breath sounds: equal and absent over the epigastrium Cuff inflated: yes ETT to lip: 24 cm Tube secured with: ETT holder Chest x-ray interpreted by me. Chest x-ray findings: endotracheal tube too low Tube repositioned: tube repositioned successfully Patient tolerance: Patient tolerated the procedure well with no immediate complications   (including critical care time) Labs Review Labs Reviewed  CBC - Abnormal; Notable for the following:    WBC 30.9 (*)    Platelets 524 (*)    All other components within normal limits  COMPREHENSIVE METABOLIC PANEL - Abnormal; Notable for the following:    Potassium 3.3 (*)    CO2 21 (*)    Glucose, Bld 277 (*)  BUN 23 (*)    Creatinine, Ser 1.24 (*)    AST 94 (*)    ALT 73 (*)    GFR calc non Af Amer 46 (*)    GFR calc Af Amer 54 (*)    All other components within normal limits  I-STAT TROPOININ, ED - Abnormal; Notable for the following:    Troponin i, poc 0.11 (*)    All other components within normal limits  I-STAT CHEM 8, ED - Abnormal; Notable  for the following:    Potassium 3.2 (*)    BUN 27 (*)    Creatinine, Ser 1.20 (*)    Glucose, Bld 270 (*)    Calcium, Ion 1.07 (*)    All other components within normal limits  CBG MONITORING, ED - Abnormal; Notable for the following:    Glucose-Capillary 194 (*)    All other components within normal limits  BLOOD GAS, ARTERIAL  TROPONIN I  TROPONIN I  TROPONIN I  BASIC METABOLIC PANEL  BASIC METABOLIC PANEL  BASIC METABOLIC PANEL  BASIC METABOLIC PANEL  BASIC METABOLIC PANEL  PROTIME-INR  PROTIME-INR  APTT  APTT  BLOOD GAS, ARTERIAL  BLOOD GAS, ARTERIAL    Imaging Review Dg Chest Port 1 View  08/24/2015  CLINICAL DATA:  60 year old female status post cardiac arrest and intubation EXAM: PORTABLE CHEST 1 VIEW COMPARISON:  None FINDINGS: Right mainstem intubation. An orogastric tube is present and is coiled within the stomach. External defibrillator pads project over the chest. Mild cardiomegaly. Pulmonary vascular congestion with mild edema. Probable bibasilar atelectasis. No pneumothorax or large pleural effusion. Slight irregularity of the lateral aspect of several left-sided ribs. Nondisplaced rib fractures not excluded. IMPRESSION: 1. Right mainstem intubation. Recommend withdrawing the endotracheal tube 4.5 cm for more optimal placement above the carina. 2. Cardiomegaly and mild pulmonary vascular congestion suggests CHF. 3. Well-positioned gastric tube. 4. Subtle irregularities the lateral aspect of several left-sided ribs. Nondisplaced fractures are not excluded. These results were called by telephone at the time of interpretation on 08/24/2015 at 1:41 pm to Dr. Lajean Saver , who verbally acknowledged these results. Electronically Signed   By: Jacqulynn Cadet M.D.   On: 08/24/2015 13:41   I have personally reviewed and evaluated these images and lab results as part of my medical decision-making.   EKG Interpretation   Date/Time:  Friday August 24 2015 13:02:53  EST Ventricular Rate:  102 PR Interval:  201 QRS Duration: 112 QT Interval:  407 QTC Calculation: 530 R Axis:   68 Text Interpretation:  Sinus tachycardia Borderline prolonged PR interval  IRBBB and LPFB Prolonged QT interval Non-specific ST-t changes No previous  tracing Confirmed by Ashok Cordia  MD, Lennette Bihari ([id number]) on 08/24/2015 1:23:01 PM      MDM   Final diagnoses:  Cardiac arrest [facility])    Patient is a 60 year old female present to the EMS postcardiac arrest. She was well this morning and collapse at school and had immediate bystander CPR by school nurse. She was shocked twice with school ED before EMS arrival. Upon EMS arrival patient in PDA was given 1 dose of epinephrine. He converted into sinus tach first SVT and was given four successive cardioversions. Upon arrival the patient GCS 5. She had snoring respirations. Her heart rate was 96 to low 100s. Her blood pressure was 157/84.  She was intubated for airway protection and increased respiratory effort.  Differential diagnosis includes arrhythmia versus ACS versus PE.   Pads placed in cooling protocol initiated.  I have discussed with patient's family and updated in clinic care.  Labs troponin 0.11, total count 30.9.  Cardiology intensive care consultation in the emergency department and saw patient at bedside and will admit to ICU.  Discussed case with my attending, Dr. Ashok Cordia.      Vira Blanco, MD 08/24/15 [place], MD 08/25/15 1257

## 2015-08-24 NOTE — Progress Notes (Signed)
   08/24/15 1400  Clinical Encounter Type  Visited With Family  Visit Type ED  Referral From Nurse  [place] (Comment) (Act as liaison for patient's spouse)  Stress Factors  Family Stress Factors Lack of knowledge;Health changes  Tilden Fossa, [place] escorted patient's husband to sub waiting room and informed doctor. Facilitated contacts, provided hospitality. Nurse took husband to look for daughter. Chaplain left another chaplain in charge.

## 2015-08-24 NOTE — ED Notes (Signed)
Ice packs placed.

## 2015-08-24 NOTE — Procedures (Signed)
Central Venous Catheter Insertion Procedure Note Marie Ramos [id number] 1955/08/08  Procedure: Insertion of Central Venous Catheter Indications: Drug and/or fluid administration  Procedure Details Consent: Unable to obtain consent because of emergent medical necessity. Time Out: Verified patient identification, verified procedure, site/side was marked, verified correct patient position, special equipment/implants available, medications/allergies/relevent history reviewed, required imaging and test results available.  Performed Real time ultrasound was used to identify Rt I/J. Maximum sterile technique was used including cap, gloves, gown, hand hygiene, mask and sheet. Skin prep: Chlorhexidine; local anesthetic administered A antimicrobial bonded/coated triple lumen catheter was placed in the right internal jugular vein using the Seldinger technique.  Evaluation Blood flow good Complications: No apparent complications Patient did tolerate procedure well. Chest X-ray ordered to verify placement.  CXR: normal.  Bincy S Varughese 08/24/2015, 4:05 PM

## 2015-08-24 NOTE — Progress Notes (Signed)
EEG completed, results pending. 

## 2015-08-24 NOTE — H&P (Signed)
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  She was in the cafeteria and lost her consciousness. School nurse was available immediately and started CPR and placed her on AED,from which she received 2 shocks.    EMS arrived and found the Patient in PEA arrest..  One shot of epinephrine was given and the patient regained her pulse back, later patient's rhythm converted into SVT. She was shocked and was converted to NSR at 360 joules. Her total estimated time for ROSC was approximately 10 minutes.  PAST MEDICAL HISTORY :  She  has a past medical history of Bipolar 1 disorder ([place]).,DM  PAST SURGICAL HISTORY: She  has no past surgical history on file.  Allergies not on file  No current facility-administered medications on file prior to encounter.   No current outpatient prescriptions on file prior to encounter.    FAMILY HISTORY:  Her has no family status information on file.   SOCIAL HISTORY: She   was a former smoker  REVIEW OF SYSTEMS:   Unable to obtain  SUBJECTIVE:  Unresponsive  VITAL SIGNS: BP 178/111 mmHg  Pulse 93  Temp(Src) 97.3 F (36.3 C) (Core (Comment))  Resp 19  Wt 98.884 kg (218 lb)  SpO2 100%  HEMODYNAMICS:    VENTILATOR SETTINGS: Vent Mode:  [-] PRVC FiO2 (%):  [100 %] 100 % Set Rate:  [16 bmp] 16 bmp Vt Set:  [500 mL] 500 mL PEEP:  [5 cmH20] 5 cmH20 Plateau Pressure:  [26 cmH20] 26 cmH20  INTAKE / OUTPUT:    PHYSICAL EXAMINATION: General:  Morbid obese white female, intubated and sedated on ventilator. Neuro unable to assess due to sedation HEENT:  Normocephalic, atraumatic. Non icteric. No discharge noted Cardiovascular:   *S1,S2,Tachycardia, no mumur, gallop or rub noted. Lungs: Clear bilaterally, with good chest expansion Abdomen: soft, large, with good bowel souns Musculoskeletal:  unable to asses due to sedation Skin:  No rash, petechiae, bruises  noted  LABS:  BMET  Recent Labs Lab 08/24/15 1300 08/24/15 1319  NA 139 139  K 3.3* 3.2*  CL 105 103  CO2 21*  --   BUN 23* 27*  CREATININE 1.24* 1.20*  GLUCOSE 277* 270*    Electrolytes  Recent Labs Lab 08/24/15 1300  CALCIUM 9.1    CBC  Recent Labs Lab 08/24/15 1300 08/24/15 1319  WBC 30.9*  --   HGB 12.6 15.0  HCT 39.0 44.0  PLT 524*  --     Coag's No results for input(s): APTT, INR in the last 168 hours.  Sepsis Markers No results for input(s): LATICACIDVEN, PROCALCITON, O2SATVEN in the last 168 hours.  ABG No results for input(s): PHART, PCO2ART, PO2ART in the last 168 hours.  Liver Enzymes  Recent Labs Lab 08/24/15 1300  AST 94*  ALT 73*  ALKPHOS 123  BILITOT 0.4  ALBUMIN 3.5    Cardiac Enzymes No results for input(s): TROPONINI, PROBNP in the last 168 hours.  Glucose  Recent Labs Lab 08/24/15 1305  GLUCAP 194*    Imaging Ct Head Wo Contrast  08/24/2015  CLINICAL DATA:  Altered mental status.  Patient is intubated. EXAM:  CT HEAD WITHOUT CONTRAST TECHNIQUE: Contiguous axial images were obtained from the base of the skull through the vertex without contrast. COMPARISON:  None FINDINGS: No evidence for acute hemorrhage, mass lesion, midline shift, hydrocephalus or large infarct. Mucosal thickening in the ethmoid air cells. No calvarial fracture. IMPRESSION: No acute intracranial abnormality. Electronically Signed   By: Markus Daft M.D.   On: 08/24/2015 15:24   Dg Chest Portable 1 View  08/24/2015  CLINICAL DATA:  60 year old female status post cardiac arrest. A central line placement and endotracheal tube repositioning. EXAM: PORTABLE CHEST 1 VIEW COMPARISON:  Prior chest x-ray obtained earlier today at  13:25 p.m. FINDINGS: The endotracheal tube has been retracted and is now 2.7 cm above the carina. Right IJ central venous catheter is been placed. The catheter tip overlies the superior cavoatrial junction. Stable cardiomegaly. Slight interval progression of pulmonary edema. Unchanged orogastric tube. Again suspect nondisplaced left rib fractures. IMPRESSION: 1. New right IJ central venous catheter the tip of which overlies the superior cavoatrial junction. No evidence of pneumothorax or other complication. 2. Repositioning of endotracheal tube which is now 2.7 cm above the carina. 3. Slight interval progression of pulmonary edema. Electronically Signed   By: Jacqulynn Cadet M.D.   On: 08/24/2015 15:00   Dg Chest Port 1 View  08/24/2015  CLINICAL DATA:  60 year old female status post cardiac arrest and intubation EXAM: PORTABLE CHEST 1 VIEW COMPARISON:  None FINDINGS: Right mainstem intubation. An orogastric tube is present and is coiled within the stomach. External defibrillator pads project over the chest. Mild cardiomegaly. Pulmonary vascular congestion with mild edema. Probable bibasilar atelectasis. No pneumothorax or large pleural effusion. Slight irregularity of the lateral aspect of several left-sided ribs. Nondisplaced rib fractures not excluded. IMPRESSION: 1. Right mainstem intubation. Recommend withdrawing the endotracheal tube 4.5 cm for more optimal placement above the carina. 2. Cardiomegaly and mild pulmonary vascular congestion suggests CHF. 3. Well-positioned gastric tube. 4. Subtle irregularities the lateral aspect of several left-sided ribs. Nondisplaced fractures are not excluded. These results were called by telephone at the time of interpretation on 08/24/2015 at 1:41 pm to Dr. Lajean Saver , who verbally acknowledged these results. Electronically Signed   By: Jacqulynn Cadet M.D.   On: 08/24/2015 13:41     STUDIES:  CT scan2/17/17> Echo 2/17> EEG 2/17> EKG  2/17>   CULTURES: None  ANTIBIOTICS: None  SIGNIFICANT EVENTS: Cardiac arrest on 08/24/15  LINES/TUBES:  Rt I/J 09/03/15 ET tube 09/03/15>  DISCUSSION: 60 years old female was admitted on 08/24/15 with cardiac arrest ROSC estimated at 10 minutes. To go for emergent left heart cath and hypothermia protocol.  ASSESSMENT / PLAN:  PULMONARY A: Acute Respiratory failure due to cardiac arrest, Pulmonary edema P:   Full vent support ABG  F/o CXR in am Wean FiO2 as able.    CARDIOVASCULAR A:   Cardiopulmonary arrest, shockable rhythm, HYPERTENSIVE P:  Emergent left heart cath Aspirin daily Differ Heparin and othet antiplatelets to Cardiology. Pending cath results If remain hypertensive after sedation will initiate antihypertensives Trend troponin and f/o Echo  RENAL A:   Mild AKI, hypokalemia  P:   Avoid hypotension Replace potassium Continue i/v fluids Follow up with chemistry  GASTROINTESTINAL A:   No acute events P:   PPI for stress ulcer prophylaxis NPO now Start tube feeds tomorrow  HEMATOLOGIC A:   Leukocytosis? Stress response P:  Trend CBC Transfuse per icu protocol  INFECTIOUS A:   none P:   Sputum culture,  procalcitonin,  ENDOCRINE A:   ?DM P:   Monitor BS q4 hours ssi coverage  NEUROLOGIC A:    Acute encephalopathy high risk for Anoxic brain injury due to cardiac arrest P:   RASS goal: -4 Hypothermia protocol in place for 24 hours EEG monitoring   FAMILY  - Updates:   Husband was updated at bedside.  - Inter-disciplinary family meet or Palliative Care meeting due by:  08/31/15   Pulmonary and [place] Pager: [phone]  08/24/2015, 3:32 PM

## 2015-08-24 NOTE — Consult Note (Signed)
CARDIOLOGY CONSULT NOTE  Patient ID: Marie Ramos, MRN: [id number], DOB/AGE: 1956-06-21 60 y.o. Admit date: 08/24/2015 Date of Consult: 08/24/2015  Primary Physician: No primary care provider on file. 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 She was completely unresponsive and was found to be pulseless. The school nurse was present and placed an AED, 2 shocks were delivered. On EMS arrival, PEA was present. ROSC was achieved after 1 mg of epinephrine administered. Post-resuscitation EKG shows no evidence of STEMI. Pt had immediate CPR by report.   Medical History:  Past Medical History  Diagnosis Date  . Bipolar 1 disorder [facility])       Surgical History: No past surgical history on file.   Home Meds: Prior to Admission medications   Not on File    Inpatient Medications:  . artificial tears  1 application Both Eyes 3 times per day  . aspirin  300 mg Rectal NOW  . cisatracurium  0.1 mg/kg Intravenous STAT  . fentaNYL (SUBLIMAZE) injection  100 mcg Intravenous Once  . heparin  5,000 Units Subcutaneous 3 times per day  . midazolam  2 mg Intravenous Once   . sodium chloride    . cisatracurium (NIMBEX) infusion    . fentaNYL infusion INTRAVENOUS    . midazolam (VERSED) infusion    . norepinephrine (LEVOPHED) Adult infusion      Allergies: Allergies not on file  Social History   Social History  . Marital Status: Married    Spouse Name: N/A  . Number of Children: N/A  . Years of Education: N/A   Occupational History  . Not on file.   Social History Main Topics  . Smoking status: Not on file  . Smokeless tobacco: Not on file  . Alcohol Use: Not on file  . Drug Use: Not on file  . Sexual Activity: Not on file   Other Topics Concern  . Not on file   Social  History Narrative  . No narrative on file     Family Hx: negative for CAD or sudden death  Review of Systems: Unable to obtain as patient is unconscious  Physical Exam: Blood pressure 176/109, pulse 101, temperature 97.7 F (36.5 C), temperature source Core (Comment), resp. rate 16, weight 98.884 kg (218 lb), SpO2 100 %. Pt is intubated, unresponsive HEENT: normal Neck: JVP normal. Carotid upstrokes normal without bruits. No thyromegaly. Lungs: equal expansion, clear bilaterally CV: Apex is discrete and nondisplaced, tachy and regular without murmur or gallop Abd: soft, +BS, no bruit, no hepatosplenomegaly Back: midline Ext: no C/C/E        DP/PT pulses intact and =        Spider veins noted Skin: warm and dry without rash Neuro: Unconscious    Labs: No results for input(s): CKTOTAL, CKMB, TROPONINI in the last 72 hours. Lab Results  Component Value Date   WBC 30.9* 08/24/2015   HGB 15.0 08/24/2015   HCT 44.0 08/24/2015   MCV 91.5 08/24/2015   PLT 524* 08/24/2015     Recent Labs Lab 08/24/15 1300 08/24/15 1319  NA 139 139  K 3.3* 3.2*  CL 105 103  CO2 21*  --   BUN 23* 27*  CREATININE 1.24* 1.20*  CALCIUM 9.1  --   PROT 6.8  --  BILITOT 0.4  --   ALKPHOS 123  --   ALT 73*  --   AST 94*  --   GLUCOSE 277* 270*   No results found for: CHOL, HDL, LDLCALC, TRIG No results found for: DDIMER  Radiology/Studies:  Dg Chest Port 1 View  08/24/2015  CLINICAL DATA:  60 year old female status post cardiac arrest and intubation EXAM: PORTABLE CHEST 1 VIEW COMPARISON:  None FINDINGS: Right mainstem intubation. An orogastric tube is present and is coiled within the stomach. External defibrillator pads project over the chest. Mild cardiomegaly. Pulmonary vascular congestion with mild edema. Probable bibasilar atelectasis. No pneumothorax or large pleural effusion. Slight irregularity of the lateral aspect of several left-sided ribs. Nondisplaced rib fractures not  excluded. IMPRESSION: 1. Right mainstem intubation. Recommend withdrawing the endotracheal tube 4.5 cm for more optimal placement above the carina. 2. Cardiomegaly and mild pulmonary vascular congestion suggests CHF. 3. Well-positioned gastric tube. 4. Subtle irregularities the lateral aspect of several left-sided ribs. Nondisplaced fractures are not excluded. These results were called by telephone at the time of interpretation on 08/24/2015 at 1:41 pm to Dr. Lajean Saver , who verbally acknowledged these results. Electronically Signed   By: Jacqulynn Cadet M.D.   On: 08/24/2015 13:41    EKG: sinus tach 102 bpm, prolonged QT interval  Cardiac Studies: pending  ASSESSMENT AND PLAN:  Out-of-hospital VF/PEA arrest: presumed initial rhythm is VF as 2 shocks delivered by AED. While STEMI is not present, pt meets criteria for urgent cardiac catheterization to evaluate for ischemic heart disease as a cause of her arrest. Other possibilities include PE/prolonged QT secondary to psychotropic drugs. Further plans pending cath results.  Limited hx available at the time of this consultation. On my evaluation, the patient is in sinus rhythm and is hemodynamically stable. Discussed with CCM team - plans for hypothermia noted. Further disposition pending cath results.   SignedSherren Mocha MD, [facility] 08/24/2015, 2:40 PM

## 2015-08-24 NOTE — Progress Notes (Signed)
  Echocardiogram 2D Echocardiogram has been performed.  Marie Ramos 08/24/2015, 6:16 PM

## 2015-08-24 NOTE — Progress Notes (Signed)
ETT pulled back 2cm per MD order. Now 22 at lip. RT will continue to monitor.

## 2015-08-24 NOTE — ED Notes (Signed)
Pt with increased snoring rr.

## 2015-08-24 NOTE — Progress Notes (Signed)
ETT pulled back 1cm per NP order. Now 21 at lip. RT will continue to monitor.

## 2015-08-25 DIAGNOSIS — J9601 Acute respiratory failure with hypoxia: Secondary | ICD-10-CM

## 2015-08-25 DIAGNOSIS — G9340 Encephalopathy, unspecified: Secondary | ICD-10-CM

## 2015-08-25 DIAGNOSIS — I4581 Long QT syndrome: Secondary | ICD-10-CM

## 2015-08-25 DIAGNOSIS — R7989 Other specified abnormal findings of blood chemistry: Secondary | ICD-10-CM

## 2015-08-25 LAB — POCT I-STAT, CHEM 8
BUN: 19 mg/dL (ref 6–20)
BUN: 20 mg/dL (ref 6–20)
BUN: 20 mg/dL (ref 6–20)
BUN: 21 mg/dL — AB (ref 6–20)
BUN: 22 mg/dL — ABNORMAL HIGH (ref 6–20)
CHLORIDE: 113 mmol/L — AB (ref 101–111)
CHLORIDE: 114 mmol/L — AB (ref 101–111)
CHLORIDE: 115 mmol/L — AB (ref 101–111)
CREATININE: 0.6 mg/dL (ref 0.44–1.00)
CREATININE: 0.7 mg/dL (ref 0.44–1.00)
Calcium, Ion: 0.98 mmol/L — ABNORMAL LOW (ref 1.13–1.30)
Calcium, Ion: 1.09 mmol/L — ABNORMAL LOW (ref 1.13–1.30)
Calcium, Ion: 1.04 mmol/L — ABNORMAL LOW (ref 1.13–1.30)
Calcium, Ion: 1.02 mmol/L — ABNORMAL LOW (ref 1.13–1.30)
Calcium, Ion: 1.11 mmol/L — ABNORMAL LOW (ref 1.13–1.30)
Chloride: 111 mmol/L (ref 101–111)
Chloride: 119 mmol/L — ABNORMAL HIGH (ref 101–111)
Creatinine, Ser: 0.6 mg/dL (ref 0.44–1.00)
Creatinine, Ser: 0.6 mg/dL (ref 0.44–1.00)
Creatinine, Ser: 0.5 mg/dL (ref 0.44–1.00)
GLUCOSE: 106 mg/dL — AB (ref 65–99)
Glucose, Bld: 103 mg/dL — ABNORMAL HIGH (ref 65–99)
Glucose, Bld: 99 mg/dL (ref 65–99)
Glucose, Bld: 99 mg/dL (ref 65–99)
Glucose, Bld: 94 mg/dL (ref 65–99)
HEMATOCRIT: 36 % (ref 36.0–46.0)
HEMATOCRIT: 36 % (ref 36.0–46.0)
HEMATOCRIT: 36 % (ref 36.0–46.0)
HEMATOCRIT: 39 % (ref 36.0–46.0)
HEMATOCRIT: 40 % (ref 36.0–46.0)
HEMOGLOBIN: 12.2 g/dL (ref 12.0–15.0)
HEMOGLOBIN: 12.2 g/dL (ref 12.0–15.0)
Hemoglobin: 13.3 g/dL (ref 12.0–15.0)
Hemoglobin: 13.6 g/dL (ref 12.0–15.0)
Hemoglobin: 12.2 g/dL (ref 12.0–15.0)
POTASSIUM: 3.4 mmol/L — AB (ref 3.5–5.1)
POTASSIUM: 3.7 mmol/L (ref 3.5–5.1)
POTASSIUM: 4 mmol/L (ref 3.5–5.1)
POTASSIUM: 4 mmol/L (ref 3.5–5.1)
POTASSIUM: 4.2 mmol/L (ref 3.5–5.1)
SODIUM: 142 mmol/L (ref 135–145)
SODIUM: 145 mmol/L (ref 135–145)
SODIUM: 145 mmol/L (ref 135–145)
SODIUM: 147 mmol/L — AB (ref 135–145)
Sodium: 146 mmol/L — ABNORMAL HIGH (ref 135–145)
TCO2: 23 mmol/L (ref 0–100)
TCO2: 23 mmol/L (ref 0–100)
TCO2: 20 mmol/L (ref 0–100)
TCO2: 20 mmol/L (ref 0–100)
TCO2: 19 mmol/L (ref 0–100)

## 2015-08-25 LAB — CBC
HCT: 36.9 % (ref 36.0–46.0)
Hemoglobin: 11.8 g/dL — ABNORMAL LOW (ref 12.0–15.0)
MCH: 29 pg (ref 26.0–34.0)
MCHC: 32 g/dL (ref 30.0–36.0)
MCV: 90.7 fL (ref 78.0–100.0)
PLATELETS: 492 10*3/uL — AB (ref 150–400)
RBC: 4.07 MIL/uL (ref 3.87–5.11)
RDW: 15 % (ref 11.5–15.5)
WBC: 11.3 10*3/uL — ABNORMAL HIGH (ref 4.0–10.5)

## 2015-08-25 LAB — TROPONIN I
TROPONIN I: 0.24 ng/mL — AB (ref <0.031)
Troponin I: 0.86 ng/mL (ref <0.031)
Troponin I: 0.65 ng/mL (ref <0.031)
Troponin I: 0.36 ng/mL — ABNORMAL HIGH (ref <0.031)

## 2015-08-25 LAB — BLOOD GAS, ARTERIAL
ACID-BASE EXCESS: 3 mmol/L — AB (ref 0.0–2.0)
Bicarbonate: 26.6 mEq/L — ABNORMAL HIGH (ref 20.0–24.0)
DRAWN BY: 44166
FIO2: 0.4
MECHVT: 400 mL
O2 SAT: 99.3 %
PATIENT TEMPERATURE: 98.6
PCO2 ART: 38.2 mmHg (ref 35.0–45.0)
PEEP/CPAP: 5 cmH2O
PH ART: 7.458 — AB (ref 7.350–7.450)
PO2 ART: 150 mmHg — AB (ref 80.0–100.0)
RATE: 10 resp/min
TCO2: 27.8 mmol/L (ref 0–100)

## 2015-08-25 LAB — BASIC METABOLIC PANEL
ANION GAP: 10 (ref 5–15)
Anion gap: 9 (ref 5–15)
BUN: 19 mg/dL (ref 6–20)
BUN: 18 mg/dL (ref 6–20)
CHLORIDE: 117 mmol/L — AB (ref 101–111)
CO2: 21 mmol/L — AB (ref 22–32)
CO2: 20 mmol/L — AB (ref 22–32)
CREATININE: 0.67 mg/dL (ref 0.44–1.00)
Calcium: 7.7 mg/dL — ABNORMAL LOW (ref 8.9–10.3)
Calcium: 7.6 mg/dL — ABNORMAL LOW (ref 8.9–10.3)
Chloride: 115 mmol/L — ABNORMAL HIGH (ref 101–111)
Creatinine, Ser: 0.68 mg/dL (ref 0.44–1.00)
GFR calc Af Amer: >60 mL/min (ref >60)
GFR calc non Af Amer: >60 mL/min (ref >60)
GLUCOSE: 104 mg/dL — AB (ref 65–99)
GLUCOSE: 92 mg/dL (ref 65–99)
POTASSIUM: 3.3 mmol/L — AB (ref 3.5–5.1)
Potassium: 3.5 mmol/L (ref 3.5–5.1)
Sodium: 146 mmol/L — ABNORMAL HIGH (ref 135–145)
Sodium: 146 mmol/L — ABNORMAL HIGH (ref 135–145)

## 2015-08-25 LAB — GLUCOSE, CAPILLARY
GLUCOSE-CAPILLARY: 84 mg/dL (ref 65–99)
GLUCOSE-CAPILLARY: 88 mg/dL (ref 65–99)
GLUCOSE-CAPILLARY: 91 mg/dL (ref 65–99)
Glucose-Capillary: 100 mg/dL — ABNORMAL HIGH (ref 65–99)
Glucose-Capillary: 71 mg/dL (ref 65–99)
Glucose-Capillary: 77 mg/dL (ref 65–99)
Glucose-Capillary: 79 mg/dL (ref 65–99)
Glucose-Capillary: 83 mg/dL (ref 65–99)
Glucose-Capillary: 84 mg/dL (ref 65–99)
Glucose-Capillary: 92 mg/dL (ref 65–99)

## 2015-08-25 LAB — PROCALCITONIN: Procalcitonin: 0.58 ng/mL

## 2015-08-25 LAB — MAGNESIUM: MAGNESIUM: 1.9 mg/dL (ref 1.7–2.4)

## 2015-08-25 LAB — MRSA PCR SCREENING: MRSA BY PCR: NEGATIVE

## 2015-08-25 LAB — PHOSPHORUS: Phosphorus: 2.5 mg/dL (ref 2.5–4.6)

## 2015-08-25 MED ORDER — SODIUM CHLORIDE 0.9% FLUSH
10.0000 mL | Freq: Two times a day (BID) | INTRAVENOUS | Status: DC
  Administered 2015-08-25 – 2015-08-28 (×7): 10 mL
  Administered 2015-08-29: 30 mL

## 2015-08-25 MED ORDER — POTASSIUM CHLORIDE 10 MEQ/100ML IV SOLN
10.0000 meq | INTRAVENOUS | Status: AC
  Administered 2015-08-25 (×6): 10 meq via INTRAVENOUS
  Filled 2015-08-25 (×6): qty 100

## 2015-08-25 MED ORDER — SODIUM CHLORIDE 0.9% FLUSH
10.0000 mL | INTRAVENOUS | Status: DC | PRN
Start: 2015-08-25 — End: 2015-09-04

## 2015-08-25 MED ORDER — CHLORHEXIDINE GLUCONATE 0.12% ORAL RINSE (MEDLINE KIT)
15.0000 mL | Freq: Two times a day (BID) | OROMUCOSAL | Status: DC
  Administered 2015-08-25 – 2015-08-27 (×6): 15 mL via OROMUCOSAL

## 2015-08-25 MED ORDER — ANTISEPTIC ORAL RINSE SOLUTION (CORINZ)
7.0000 mL | OROMUCOSAL | Status: DC
  Administered 2015-08-25 – 2015-08-27 (×26): 7 mL via OROMUCOSAL

## 2015-08-25 NOTE — Plan of Care (Signed)
Problem: Phase I Progression Outcomes Goal: Maintain MAP > 85 mmHg Outcome: Completed/Met Date Met:  08/25/15 MAP Goal > 80 Goal: If STEMI, emergency Cardiac Cath Outcome: Not Applicable Date Met:  38/70/65 Left catheterization d/t HTN and cardiac arrest Goal: Electrolytes normal or corrected Outcome: Completed/Met Date Met:  08/25/15 Runs of K

## 2015-08-25 NOTE — Progress Notes (Signed)
Per [employer] protocol, rewarming initiated witnessed x2 RN (Delbert Phenix, RN and Glori Luis, RN).

## 2015-08-25 NOTE — Progress Notes (Signed)
Pt diamond ring with gold band, as well as, pt silver wedding band with flower-vine detail, removed and placed in a biohazard bag and given to husband and son at bedside.

## 2015-08-25 NOTE — Progress Notes (Signed)
Pt admitted with long QTc d/t psychotropic drugs. Discussed lengthening of QTc with NP and stated expected finding of hypothermia pt.

## 2015-08-25 NOTE — Procedures (Signed)
Arterial Catheter Insertion Procedure Note KEILEEN BRIDE [id number] 08-13-1955  Procedure: Insertion of Arterial Catheter  Indications: Blood pressure monitoring  Procedure Details Consent: Unable to obtain consent because of emergent medical necessity. Time Out: Verified patient identification, verified procedure, site/side was marked, verified correct patient position, special equipment/implants available, medications/allergies/relevent history reviewed, required imaging and test results available.  Performed  Maximum sterile technique was used including antiseptics, cap, gloves, gown, hand hygiene, mask and sheet. Skin prep: Chlorhexidine; local anesthetic administered 20 gauge catheter was inserted into left radial artery using the Seldinger technique.  Evaluation Blood flow good; BP tracing good. Complications: No apparent complications   Skin was prep with chlorhexidine. Left Radial arterial line catheter is in place. Good reading and a-line is good for use. Good draw back on the arterial line and line flushes well. Pt was stable throughout procedure. No complications noted.    Leigh Aurora, BS, RCP, RRT 08/25/2015

## 2015-08-25 NOTE — Progress Notes (Signed)
[place] Progress Note Patient Name: Marie Ramos DOB: 11-Jul-1955 MRN: [id number]   Date of Service  08/25/2015  HPI/Events of Note  Hypokalemia  eICU Interventions  Potassium replaced     Intervention Category Intermediate Interventions: Electrolyte abnormality - evaluation and management  [name] 08/25/2015, 2:41 AM

## 2015-08-25 NOTE — Progress Notes (Signed)
Initial Nutrition Assessment  DOCUMENTATION CODES:  Obesity unspecified  INTERVENTION:  If unable to extubate and when medically able, recommend TF. Initiate VHP @ 20 ml/hr via OG/NGT and increase by 10 ml every 4  hours to goal rate of 50 ml/hr.   30 ml Prostat BID.    Tube feeding regimen provides 1400 kcal (100% of needs), 135 grams of protein, and 1003 ml of H2O.   NUTRITION DIAGNOSIS:  Inadequate oral intake related to inability to eat as evidenced by NPO status.  GOAL:  Provide needs based on ASPEN/SCCM guidelines  MONITOR:  Diet advancement, Vent status, Labs, I & O's  REASON FOR ASSESSMENT:  Ventilator    ASSESSMENT:  60 y/o female PMHx bipolar disorder admitted after experiencing cardiac arrest. Received shocks and pulse recovered after epi injection. Time ROSC ~10 minutes  Per notes, TF to be considered in near future.   Per RN, no TF today. Still on paralytics.   Unable to assess abdomen.  No signs fat/muscle loss.   Patient is currently intubated on ventilator support MV: 7.1 L/min Temp (24hrs), Avg:93.2 F (34 C), Min:89.4 F (31.9 C), Max:98.8 F (37.1 C)  Propofol: None  Diet Order:     Skin: Ecchymotic breast, abrasion knee, dry skin  Last BM:  Unknown  Height:  Ht Readings from Last 1 Encounters:  08/24/15 5\' 7"  (1.702 m)   Weight:  Wt Readings from Last 1 Encounters:  08/24/15 229 lb 4.5 oz (104 kg)  Admit weight: 218 lbs (99.09 kg)  Ideal Body Weight:  61.36 kg  BMI:  Body mass index is 35.9 kg/(m^2).  Estimated Nutritional Needs:  Kcal:  [id number] (11-14 kcal/kg bw) Protein:  >123 g Pro (2 g/kg ibw) Fluid:  Per MD  EDUCATION NEEDS:  No education needs identified at this time  Burtis Junes RD, LDN Nutrition Pager: [phone] 08/25/2015 11:22 AM

## 2015-08-25 NOTE — Progress Notes (Signed)
ABG collected  

## 2015-08-25 NOTE — Progress Notes (Signed)
PULMONARY / CRITICAL CARE MEDICINE   Name: Marie Ramos MRN: [id number] DOB: January 29, 1956    ADMISSION DATE:  08/24/2015 CONSULTATION DATE: 08/24/15  REFERRING MD:    CHIEF COMPLAINT:  Cardiac arrest   SUBJECTIVE:  RN reports no acute events. Prolonged QTc @ 645  VITAL SIGNS: BP 123/84 mmHg  Pulse 46  Temp(Src) 90.7 F (32.6 C) (Core (Comment))  Resp 14  Ht 5\' 7"  (1.702 m)  Wt 229 lb 4.5 oz (104 kg)  BMI 35.90 kg/m2  SpO2 100%  HEMODYNAMICS: CVP:  [1 mmHg-5 mmHg] 2 mmHg  VENTILATOR SETTINGS: Vent Mode:  [-] PRVC FiO2 (%):  [40 %-100 %] 40 % Set Rate:  [14 bmp-16 bmp] 14 bmp Vt Set:  [500 mL] 500 mL PEEP:  [5 cmH20] 5 cmH20 Plateau Pressure:  [21 cmH20-26 cmH20] 21 cmH20  INTAKE / OUTPUT: I/O last 3 completed shifts: In: 2461 [I.V.:1561; IV Piggyback:900] Out: VJ:232150; Emesis/NG output:350]  PHYSICAL EXAMINATION: General:  Obese female, intubated and sedated on ventilator. Neuro:  unable to assess due to sedation/paralytics HEENT:  Normocephalic, atraumatic. Non icteric.  Cardiovascular:  S1,S2 rrr, brady, no mumur, gallop or rub noted. Lungs: Clear bilaterally, with good chest expansion Abdomen: soft, large, with good bowel sounds, cooling pad in place Musculoskeletal:  No acute deformities  Skin:  No rashes or lesions  LABS:  BMET  Recent Labs Lab 08/24/15 1300  08/24/15 1650  08/24/15 2302 08/25/15 0052 08/25/15 0410  NA 139  < > 145  < > 145 147* 146*  K 3.3*  < > 2.8*  < > 4.2 3.4* 3.3*  CL 105  < > 112*  < > 113* 111 115*  CO2 21*  --  22  --   --   --  21*  BUN 23*  < > 20  < > 20 21* 19  CREATININE 1.24*  < > 0.88  < > 0.60 0.70 0.68  GLUCOSE 277*  < > 141*  < > 99 103* 104*  < > = values in this interval not displayed.  Electrolytes  Recent Labs Lab 08/24/15 1300 08/24/15 1650 08/24/15 1838 08/25/15 0410  CALCIUM 9.1 7.4*  --  7.7*  MG  --   --  2.2 1.9  PHOS  --   --  4.1 2.5    CBC  Recent Labs Lab  08/24/15 1300  08/24/15 2302 08/25/15 0052 08/25/15 0410  WBC 30.9*  --   --   --  11.3*  HGB 12.6  < > 13.3 13.6 11.8*  HCT 39.0  < > 39.0 40.0 36.9  PLT 524*  --   --   --  492*  < > = values in this interval not displayed.  Coag's  Recent Labs Lab 08/24/15 1650 08/24/15 2140  APTT 90* 25  INR 1.27 1.13    Sepsis Markers  Recent Labs Lab 08/24/15 1650 08/25/15 0410  PROCALCITON 0.21 0.58    ABG  Recent Labs Lab 08/24/15 1652 08/25/15 0342  PHART 7.214* 7.458*  PCO2ART 53.4* 38.2  PO2ART 193.0* 150*    Liver Enzymes  Recent Labs Lab 08/24/15 1300  AST 94*  ALT 73*  ALKPHOS 123  BILITOT 0.4  ALBUMIN 3.5    Cardiac Enzymes  Recent Labs Lab 08/24/15 1650 08/25/15 0050 08/25/15 0410  TROPONINI 1.16* 0.86* 0.65*    Glucose  Recent Labs Lab 08/24/15 2133 08/24/15 2215 08/24/15 2300 08/25/15 08/25/15 0215 08/25/15 0409  GLUCAP 87 92 92 84  84 91    Imaging Ct Head Wo Contrast  08/24/2015  CLINICAL DATA:  Altered mental status.  Patient is intubated. EXAM: CT HEAD WITHOUT CONTRAST TECHNIQUE: Contiguous axial images were obtained from the base of the skull through the vertex without contrast. COMPARISON:  None FINDINGS: No evidence for acute hemorrhage, mass lesion, midline shift, hydrocephalus or large infarct. Mucosal thickening in the ethmoid air cells. No calvarial fracture. IMPRESSION: No acute intracranial abnormality. Electronically Signed   By: Markus Daft M.D.   On: 08/24/2015 15:24   Dg Chest Portable 1 View  08/24/2015  CLINICAL DATA:  60 year old female status post cardiac arrest. A central line placement and endotracheal tube repositioning. EXAM: PORTABLE CHEST 1 VIEW COMPARISON:  Prior chest x-ray obtained earlier today at 13:25 p.m. FINDINGS: The endotracheal tube has been retracted and is now 2.7 cm above the carina. Right IJ central venous catheter is been placed. The catheter tip overlies the superior cavoatrial junction. Stable  cardiomegaly. Slight interval progression of pulmonary edema. Unchanged orogastric tube. Again suspect nondisplaced left rib fractures. IMPRESSION: 1. New right IJ central venous catheter the tip of which overlies the superior cavoatrial junction. No evidence of pneumothorax or other complication. 2. Repositioning of endotracheal tube which is now 2.7 cm above the carina. 3. Slight interval progression of pulmonary edema. Electronically Signed   By: Jacqulynn Cadet M.D.   On: 08/24/2015 15:00   Dg Chest Port 1 View  08/24/2015  CLINICAL DATA:  60 year old female status post cardiac arrest and intubation EXAM: PORTABLE CHEST 1 VIEW COMPARISON:  None FINDINGS: Right mainstem intubation. An orogastric tube is present and is coiled within the stomach. External defibrillator pads project over the chest. Mild cardiomegaly. Pulmonary vascular congestion with mild edema. Probable bibasilar atelectasis. No pneumothorax or large pleural effusion. Slight irregularity of the lateral aspect of several left-sided ribs. Nondisplaced rib fractures not excluded. IMPRESSION: 1. Right mainstem intubation. Recommend withdrawing the endotracheal tube 4.5 cm for more optimal placement above the carina. 2. Cardiomegaly and mild pulmonary vascular congestion suggests CHF. 3. Well-positioned gastric tube. 4. Subtle irregularities the lateral aspect of several left-sided ribs. Nondisplaced fractures are not excluded. These results were called by telephone at the time of interpretation on 08/24/2015 at 1:41 pm to Dr. Lajean Saver , who verbally acknowledged these results. Electronically Signed   By: Jacqulynn Cadet M.D.   On: 08/24/2015 13:41     STUDIES:  CT scan 2/17 > no acute abnormality Echo 2/17 > EEG 2/17 > EKG 2/17 > QTc 645   CULTURES: Sputum 2/17 >>  ANTIBIOTICS: None  SIGNIFICANT EVENTS: 2/17  Admit with Cardiac arrest, LHC with no disease.  Arrest thought related to PSY medications with prolonged  QTc  LINES/TUBES: Rt I/J 2/27 >> ET tube 2/27 >>  DISCUSSION: 60 years old female was admitted on 08/24/15 with cardiac arrest ROSC estimated at 10 minutes. Emergent left heart cath with no disease found and hypothermia protocol.  Prolonged Qtc.  ASSESSMENT / PLAN:  PULMONARY A: Acute Respiratory failure due to cardiac arrest Pulmonary edema P:   PRVC, 8 cc/kg  Rate set at 14, ABG reviewed Intermittent CXR  Wean FiO2 / PEEP for sats > 92%   CARDIOVASCULAR A:  Cardiopulmonary arrest - shockable rhythm with AED x2, 10 min CPR.  LHC without CAD Bradycardia  Prolonged QTc - in setting of psy medications Hypotension - post arrest + sedation P:  ICU monitoring of hemodynamics Hypothermia protocol  Goal MAP > 85 Aspirin  daily Defer Heparin and othet antiplatelets to Cardiology. Trend troponin Follow up ECHO  RENAL A:   Mild AKI Hypokalemia P:   Avoid hypotension, ensure adequate hydration Trend BMP / UOP  Replace electrolytes as indicated    GASTROINTESTINAL A:   Obesity  P:   PPI for stress ulcer prophylaxis NPO now Consider initiation of TF in am 2/19  HEMATOLOGIC A:   Leukocytosis - ? Stress response P:  Trend CBC Transfuse per icu protocol  INFECTIOUS A:   No evidence of acute infectious process P:   Follow cultures as above Trend PCT   ENDOCRINE A:   ?DM P:   Monitor BS q4 hours SSI coverage  NEUROLOGIC A:   Acute encephalopathy high risk for Anoxic brain injury due to cardiac arrest P:   RASS goal: -4 Sedation / Paralytics per hypothermia protocol  EEG monitoring   FAMILY  - Updates:  No family at bedside am 2/18.    - Inter-disciplinary family meet or Palliative Care meeting due by:  08/31/15      Noe Gens, NP-C [facility] Pgr: [phone] or if no answer [phone] 08/25/2015, 7:35 AM

## 2015-08-25 NOTE — Progress Notes (Signed)
Pt potassium 3.4. MD notified and orders received for replacement.

## 2015-08-25 NOTE — Consult Note (Signed)
ELECTROPHYSIOLOGY CONSULT NOTE    Patient ID: Marie Ramos MRN: [id number], DOB/AGE: May 29, 1956 60 y.o.  Admit date: 08/24/2015 Date of Consult: 08/25/2015  Primary Physician: No primary care provider on file. Primary Cardiologist: new to [facility] Kn[facility]) Referring Physician: Burt Knack  Reason for Consultation: cardiac arrest  HPI:  Marie Ramos is a 60 y.o. female with a past medical history signficant only for bipolar disorder. 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  Bystander CPR was immediately started and an AED advised 2 shocks.  On EMS arrival, she was in PEA and had ROSC after epi.  She was transferred to [facility] and is being cooled.  Cardiac catheterization yesterday demonstrated no CAD. Echo is pending. EKG demonstrated prolonged QT on arrival. EP has been asked to evaluate for treatment options.   Lab work on admission is notable for K of 3.3, troponin of 0.11 (peak of 1.16).   ROS is unable to be obtained 2/2 intubation and sedation.   Past Medical History  Diagnosis Date  . Bipolar 1 disorder [facility])      Surgical History:  Past Surgical History  Procedure Laterality Date  . Cardiac catheterization N/A 08/24/2015    Procedure: Left Heart Cath and Coronary Angiography;  Surgeon: Sherren Mocha, MD;  Location: [facility];  Service: Cardiovascular;  Laterality: N/A;     Prescriptions prior to admission  Medication Sig Dispense Refill Last Dose  . amphetamine-dextroamphetamine (ADDERALL) 20 MG tablet Take 20 mg by mouth 3 (three) times daily.   0 08/24/2015 at Unknown time  . fexofenadine (ALLEGRA) 60 MG tablet Take 60 mg by mouth daily.   unknown  . QUEtiapine (SEROQUEL) 100 MG tablet Take 100 mg by mouth at bedtime.  4 08/23/2015 at Unknown time  . topiramate (TOPAMAX) 100 MG tablet Take 100 mg by mouth at bedtime.  3 08/23/2015 at Unknown time  . venlafaxine XR (EFFEXOR-XR) 75 MG 24 hr capsule Take 75 mg by mouth daily.  4 08/24/2015  at Unknown time    Inpatient Medications:  . artificial tears  1 application Both Eyes 3 times per day  . heparin  5,000 Units Subcutaneous 3 times per day  . insulin aspart  2-6 Units Subcutaneous 6 times per day  . pantoprazole (PROTONIX) IV  40 mg Intravenous Q24H  . potassium chloride  10 mEq Intravenous Q1 Hr x 6  . sodium chloride flush  3 mL Intravenous Q12H    Allergies:  Allergies  Allergen Reactions  . Naproxen Itching  . Vicodin [Hydrocodone-Acetaminophen] Itching    Social History   Social History  . Marital Status: Married    Spouse Name: N/A  . Number of Children: N/A  . Years of Education: N/A   Occupational History  . Not on file.   Social History Main Topics  . Smoking status: Not on file  . Smokeless tobacco: Not on file  . Alcohol Use: Not on file  . Drug Use: Not on file  . Sexual Activity: Not on file   Other Topics Concern  . Not on file   Social History Narrative  . No narrative on file     Family History - unable to be obtained 2/2 intubation   Review of Systems: Unable to be obtained 2/2 intubation  Physical Exam: Filed Vitals:   08/25/15 0200 08/25/15 0300 08/25/15 0400 08/25/15 0500  BP:      Pulse: 49 43 42 45  Temp: 92.3  F (33.5 C) 90.7 F (32.6 C) 89.4 F (31.9 C) 90.1 F (32.3 C)  TempSrc: Core (Comment) Core (Comment) Core (Comment) Core (Comment)  Resp: 14 16 14 14   Height:      Weight:      SpO2: 100% 100% 100% 100%    GEN- The patient is intubated and sedated appearing  HEENT: normocephalic, atraumatic; sclera clear, conjunctiva pink; hearing intact; +ETT, right IJ Lungs- Clear to ausculation bilaterally, normal work of breathing Heart- Regular rate and rhythm GI- soft, non-tender, non-distended, bowel sounds present Extremities- no clubbing, cyanosis, or edema  MS- no significant deformity or atrophy Skin- warm and dry, no rash or lesion Psych- intubated  Labs:   Lab Results  Component Value Date    WBC 11.3* 08/25/2015   HGB 11.8* 08/25/2015   HCT 36.9 08/25/2015   MCV 90.7 08/25/2015   PLT 492* 08/25/2015    Recent Labs Lab 08/24/15 1300  08/25/15 0410  NA 139  < > 146*  K 3.3*  < > 3.3*  CL 105  < > 115*  CO2 21*  < > 21*  BUN 23*  < > 19  CREATININE 1.24*  < > 0.68  CALCIUM 9.1  < > 7.7*  PROT 6.8  --   --   BILITOT 0.4  --   --   ALKPHOS 123  --   --   ALT 73*  --   --   AST 94*  --   --   GLUCOSE 277*  < > 104*  < > = values in this interval not displayed.    Radiology/Studies: Ct Head Wo Contrast 08/24/2015  CLINICAL DATA:  Altered mental status.  Patient is intubated. EXAM: CT HEAD WITHOUT CONTRAST TECHNIQUE: Contiguous axial images were obtained from the base of the skull through the vertex without contrast. COMPARISON:  None FINDINGS: No evidence for acute hemorrhage, mass lesion, midline shift, hydrocephalus or large infarct. Mucosal thickening in the ethmoid air cells. No calvarial fracture. IMPRESSION: No acute intracranial abnormality. Electronically Signed   By: Markus Daft M.D.   On: 08/24/2015 15:24   Dg Chest Portable 1 View 08/24/2015  CLINICAL DATA:  60 year old female status post cardiac arrest. A central line placement and endotracheal tube repositioning. EXAM: PORTABLE CHEST 1 VIEW COMPARISON:  Prior chest x-ray obtained earlier today at 13:25 p.m. FINDINGS: The endotracheal tube has been retracted and is now 2.7 cm above the carina. Right IJ central venous catheter is been placed. The catheter tip overlies the superior cavoatrial junction. Stable cardiomegaly. Slight interval progression of pulmonary edema. Unchanged orogastric tube. Again suspect nondisplaced left rib fractures. IMPRESSION: 1. New right IJ central venous catheter the tip of which overlies the superior cavoatrial junction. No evidence of pneumothorax or other complication. 2. Repositioning of endotracheal tube which is now 2.7 cm above the carina. 3. Slight interval progression of pulmonary  edema. Electronically Signed   By: Jacqulynn Cadet M.D.   On: 08/24/2015 15:00   EKG:ST, prolonged QT  TELEMETRY: sinus bradycardia, prolonged QT  Assessment/Plan: 1.  VF/PEA arrest The patient had an OOH resuscitated cardiac arrest presumed to be VF with AED advising shocks.  She received immediate bystander CPR.  Estimated downtime 10 minutes. Cath yesterday with no CAD. Echo pending. QT prolonged on admission in the setting of psychiatric drugs.  I suspect that this was the cause of her arrest.  No further arrhythmias noted.  Will add beta blocker once cooled if heart rate/ bp  allow. Continue supportive care per CCM, plans for rewarming at [place] K >3.9, Mg >1.8 Will need to re-evaluate QT once patient is rewarmed and psychotropic medicines have washed out. Avoid QT prolonging medications Await echo   2.  Elevated troponin Due to demand ischemia, cath with no CAD  3. Psych Will reassess once off psychotropic medicines Given prolonged qt, this presents a problem. We may have to consult psychiatry for non qt prolonging options once she is recovered/ awake   This is a very complicated situation in a very ill patient.  A high level of decision making was required for this encounter.  Army Fossa MD  08/25/2015 6:07 AM

## 2015-08-26 ENCOUNTER — Inpatient Hospital Stay (HOSPITAL_COMMUNITY): Payer: BLUE CROSS/BLUE SHIELD

## 2015-08-26 DIAGNOSIS — J9600 Acute respiratory failure, unspecified whether with hypoxia or hypercapnia: Secondary | ICD-10-CM | POA: Diagnosis present

## 2015-08-26 DIAGNOSIS — J9601 Acute respiratory failure with hypoxia: Secondary | ICD-10-CM | POA: Diagnosis present

## 2015-08-26 DIAGNOSIS — I4901 Ventricular fibrillation: Principal | ICD-10-CM

## 2015-08-26 LAB — CBC
HCT: 37.6 % (ref 36.0–46.0)
HEMOGLOBIN: 11.6 g/dL — AB (ref 12.0–15.0)
MCH: 28.6 pg (ref 26.0–34.0)
MCHC: 30.9 g/dL (ref 30.0–36.0)
MCV: 92.6 fL (ref 78.0–100.0)
PLATELETS: 496 10*3/uL — AB (ref 150–400)
RBC: 4.06 MIL/uL (ref 3.87–5.11)
RDW: 16 % — ABNORMAL HIGH (ref 11.5–15.5)
WBC: 11.2 10*3/uL — ABNORMAL HIGH (ref 4.0–10.5)

## 2015-08-26 LAB — POCT I-STAT 3, ART BLOOD GAS (G3+)
ACID-BASE DEFICIT: 9 mmol/L — AB (ref 0.0–2.0)
ACID-BASE DEFICIT: 7 mmol/L — AB (ref 0.0–2.0)
Acid-base deficit: 10 mmol/L — ABNORMAL HIGH (ref 0.0–2.0)
Acid-base deficit: 10 mmol/L — ABNORMAL HIGH (ref 0.0–2.0)
BICARBONATE: 20.7 meq/L (ref 20.0–24.0)
BICARBONATE: 18.3 meq/L — AB (ref 20.0–24.0)
Bicarbonate: 19.3 mEq/L — ABNORMAL LOW (ref 20.0–24.0)
Bicarbonate: 18.2 mEq/L — ABNORMAL LOW (ref 20.0–24.0)
O2 SAT: 88 %
O2 SAT: 92 %
O2 Saturation: 87 %
O2 Saturation: 96 %
PCO2 ART: 59.6 mmHg — AB (ref 35.0–45.0)
PCO2 ART: 57 mmHg — AB (ref 35.0–45.0)
PCO2 ART: 50.1 mmHg — AB (ref 35.0–45.0)
PH ART: 7.297 — AB (ref 7.350–7.450)
PO2 ART: 71 mmHg — AB (ref 80.0–100.0)
PO2 ART: 81 mmHg (ref 80.0–100.0)
PO2 ART: 96 mmHg (ref 80.0–100.0)
Patient temperature: 36.8
Patient temperature: 36.7
TCO2: 23 mmol/L (ref 0–100)
TCO2: 21 mmol/L (ref 0–100)
TCO2: 20 mmol/L (ref 0–100)
TCO2: 19 mmol/L (ref 0–100)
pCO2 arterial: 37.4 mmHg (ref 35.0–45.0)
pH, Arterial: 7.148 — CL (ref 7.350–7.450)
pH, Arterial: 7.136 — CL (ref 7.350–7.450)
pH, Arterial: 7.17 — CL (ref 7.350–7.450)
pO2, Arterial: 66 mmHg — ABNORMAL LOW (ref 80.0–100.0)

## 2015-08-26 LAB — BASIC METABOLIC PANEL
ANION GAP: 8 (ref 5–15)
Anion gap: 7 (ref 5–15)
Anion gap: 9 (ref 5–15)
BUN: 16 mg/dL (ref 6–20)
BUN: 17 mg/dL (ref 6–20)
BUN: 17 mg/dL (ref 6–20)
CALCIUM: 7.7 mg/dL — AB (ref 8.9–10.3)
CHLORIDE: 117 mmol/L — AB (ref 101–111)
CHLORIDE: 118 mmol/L — AB (ref 101–111)
CO2: 19 mmol/L — ABNORMAL LOW (ref 22–32)
CO2: 19 mmol/L — ABNORMAL LOW (ref 22–32)
CO2: 21 mmol/L — ABNORMAL LOW (ref 22–32)
CREATININE: 0.65 mg/dL (ref 0.44–1.00)
CREATININE: 0.78 mg/dL (ref 0.44–1.00)
CREATININE: 0.85 mg/dL (ref 0.44–1.00)
Calcium: 7.6 mg/dL — ABNORMAL LOW (ref 8.9–10.3)
Calcium: 7.8 mg/dL — ABNORMAL LOW (ref 8.9–10.3)
Chloride: 114 mmol/L — ABNORMAL HIGH (ref 101–111)
GFR calc Af Amer: >60 mL/min (ref >60)
GFR calc Af Amer: >60 mL/min (ref >60)
GFR calc Af Amer: >60 mL/min (ref >60)
GFR calc non Af Amer: >60 mL/min (ref >60)
GFR calc non Af Amer: >60 mL/min (ref >60)
GLUCOSE: 107 mg/dL — AB (ref 65–99)
Glucose, Bld: 90 mg/dL (ref 65–99)
Glucose, Bld: 93 mg/dL (ref 65–99)
POTASSIUM: 4.2 mmol/L (ref 3.5–5.1)
Potassium: 3.6 mmol/L (ref 3.5–5.1)
Potassium: 3.9 mmol/L (ref 3.5–5.1)
SODIUM: 145 mmol/L (ref 135–145)
SODIUM: 146 mmol/L — AB (ref 135–145)
Sodium: 141 mmol/L (ref 135–145)

## 2015-08-26 LAB — GLUCOSE, CAPILLARY
GLUCOSE-CAPILLARY: 78 mg/dL (ref 65–99)
GLUCOSE-CAPILLARY: 100 mg/dL — AB (ref 65–99)
GLUCOSE-CAPILLARY: 198 mg/dL — AB (ref 65–99)
GLUCOSE-CAPILLARY: 66 mg/dL (ref 65–99)
Glucose-Capillary: 97 mg/dL (ref 65–99)
Glucose-Capillary: 91 mg/dL (ref 65–99)

## 2015-08-26 LAB — PROCALCITONIN: Procalcitonin: 0.54 ng/mL

## 2015-08-26 MED ORDER — METOPROLOL TARTRATE 25 MG PO TABS: 25.0000 mg | ORAL_TABLET | Freq: Two times a day (BID) | ORAL | Status: DC

## 2015-08-26 MED ORDER — DEXTROSE 50 % IV SOLN
50.0000 mL | Freq: Once | INTRAVENOUS | Status: AC
  Administered 2015-08-26: 50 mL via INTRAVENOUS

## 2015-08-26 MED ORDER — FENTANYL CITRATE (PF) 100 MCG/2ML IJ SOLN
100.0000 ug | INTRAMUSCULAR | Status: AC | PRN
  Administered 2015-08-26 (×3): 100 ug via INTRAVENOUS
  Filled 2015-08-26 (×3): qty 2

## 2015-08-26 MED ORDER — FENTANYL CITRATE (PF) 100 MCG/2ML IJ SOLN
100.0000 ug | INTRAMUSCULAR | Status: DC | PRN
  Administered 2015-08-27 – 2015-08-28 (×8): 100 ug via INTRAVENOUS
  Filled 2015-08-26 (×9): qty 2

## 2015-08-26 MED ORDER — PRO-STAT SUGAR FREE PO LIQD
30.0000 mL | Freq: Two times a day (BID) | ORAL | Status: DC
  Administered 2015-08-26 – 2015-08-28 (×5): 30 mL
  Filled 2015-08-26 (×5): qty 30

## 2015-08-26 MED ORDER — METOPROLOL TARTRATE 25 MG/10 ML ORAL SUSPENSION: 25.0000 mg | Freq: Two times a day (BID) | ORAL | Status: DC

## 2015-08-26 MED ORDER — DEXTROSE 50 % IV SOLN
INTRAVENOUS | Status: AC
  Filled 2015-08-26: qty 50

## 2015-08-26 MED ORDER — NOREPINEPHRINE BITARTRATE 1 MG/ML IV SOLN
0.0000 ug/min | INTRAVENOUS | Status: DC
  Filled 2015-08-26: qty 16

## 2015-08-26 MED ORDER — METOPROLOL TARTRATE 1 MG/ML IV SOLN
2.5000 mg | INTRAVENOUS | Status: DC | PRN
  Administered 2015-08-26 – 2015-08-28 (×9): 5 mg via INTRAVENOUS
  Filled 2015-08-26 (×9): qty 5

## 2015-08-26 MED ORDER — VITAL HIGH PROTEIN PO LIQD: 1000.0000 mL | ORAL | Status: DC

## 2015-08-26 MED ORDER — METOPROLOL TARTRATE 25 MG/10 ML ORAL SUSPENSION
25.0000 mg | Freq: Two times a day (BID) | ORAL | Status: DC
  Administered 2015-08-26 – 2015-08-28 (×5): 25 mg
  Filled 2015-08-26 (×6): qty 10

## 2015-08-26 MED ORDER — VITAL HIGH PROTEIN PO LIQD
1000.0000 mL | ORAL | Status: DC
  Administered 2015-08-26 – 2015-08-27 (×2): 1000 mL

## 2015-08-26 MED ORDER — PANTOPRAZOLE SODIUM 40 MG PO PACK
40.0000 mg | PACK | Freq: Every day | ORAL | Status: DC
  Administered 2015-08-26 – 2015-08-27 (×2): 40 mg
  Filled 2015-08-26 (×2): qty 20

## 2015-08-26 MED ORDER — PNEUMOCOCCAL VAC POLYVALENT 25 MCG/0.5ML IJ INJ
0.5000 mL | INJECTION | INTRAMUSCULAR | Status: AC
  Administered 2015-08-30: 0.5 mL via INTRAMUSCULAR
  Filled 2015-08-26: qty 0.5

## 2015-08-26 NOTE — Progress Notes (Signed)
abg results shown to dr. Elsworth Soho. Orders given to change vent rate to 24 at this time.

## 2015-08-26 NOTE — Progress Notes (Signed)
Brief Nutrition Note  Consult received for enteral/tube feeding initiation and management.  Adult Enteral Nutrition Protocol already initiated. RD adjusted order to reflect TF recommendations provided in initial assessment on 2/18. RD to follow-up 2/20.  Admitting Dx: Cardiac arrest ([place]) [I46.9]  Body mass index is 35.68 kg/(m^2). Pt meets criteria for obesity based on current BMI.  Labs:   Recent Labs Lab 08/24/15 1838  08/25/15 0410  08/25/15 2353 08/26/15 0400 08/26/15 0801  NA  --   < > 146*  < > 146* 145 141  K  --   < > 3.3*  < > 3.6 3.9 4.2  CL  --   < > 115*  < > 118* 117* 114*  CO2  --   --  21*  < > 19* 21* 19*  BUN  --   < > 19  < > 17 17 16   CREATININE  --   < > 0.68  < > 0.65 0.78 0.85  CALCIUM  --   --  7.7*  < > 7.6* 7.8* 7.7*  MG 2.2  --  1.9  --   --   --   --   PHOS 4.1  --  2.5  --   --   --   --   GLUCOSE  --   < > 104*  < > 90 93 107*  < > = values in this interval not displayed.  Clayton Bibles, MS, RD, LDN Pager: [phone] After Hours Pager: [phone]

## 2015-08-26 NOTE — Progress Notes (Signed)
PULMONARY / CRITICAL CARE MEDICINE   Name: Marie Ramos MRN: [id number] DOB: [birth year]    ADMISSION DATE:  08/24/2015 CONSULTATION DATE: 08/24/15  REFERRING MD:    CHIEF COMPLAINT:  Cardiac arrest   SUBJECTIVE:  RN reports pt awake, following commands, BP elevated to [id number] systolic.  Treated with 5mg  IV Lopressor  VITAL SIGNS: BP 176/80 mmHg  Pulse 84  Temp(Src) 98.4 F (36.9 C) (Core (Comment))  Resp 21  Ht 5\' 7"  (1.702 m)  Wt 227 lb 13.9 oz (103.36 kg)  BMI 35.68 kg/m2  SpO2 100%  HEMODYNAMICS: CVP:  [5 mmHg-12 mmHg] 6 mmHg  VENTILATOR SETTINGS: Vent Mode:  [-] PRVC FiO2 (%):  [40 %] 40 % Set Rate:  [14 bmp] 14 bmp Vt Set:  [500 mL] 500 mL PEEP:  [5 cmH20] 5 cmH20 Plateau Pressure:  [20 [id number] cmH20] 24 cmH20  INTAKE / OUTPUT: I/O last 3 completed shifts: In: 4980.1 [I.V.:4180.1; IV Piggyback:800] Out: 3133 [Urine:2783; Emesis/NG output:350]  PHYSICAL EXAMINATION: General:  Obese female, on vent in NAD Neuro:  Arouses to voice, follows two step commands, MAE HEENT:  Normocephalic, atraumatic. Non icteric.  Cardiovascular:  S1,S2 rrr, rrr, no mumur, gallop or rub noted. Lungs: Clear bilaterally, with good chest expansion Abdomen: soft, large, with good bowel sounds, cooling pad in place Musculoskeletal:  No acute deformities  Skin:  No rashes or lesions  LABS:  BMET  Recent Labs Lab 08/25/15 2000 08/25/15 2353 08/26/15 0400  NA 146* 146* 145  K 3.5 3.6 3.9  CL 117* 118* 117*  CO2 20* 19* 21*  BUN 18 17 17   CREATININE 0.67 0.65 0.78  GLUCOSE 92 90 93    Electrolytes  Recent Labs Lab 08/24/15 1838 08/25/15 0410 08/25/15 2000 08/25/15 2353 08/26/15 0400  CALCIUM  --  7.7* 7.6* 7.6* 7.8*  MG 2.2 1.9  --   --   --   PHOS 4.1 2.5  --   --   --     CBC  Recent Labs Lab 08/24/15 1300  08/25/15 0410  08/25/15 1125 08/25/15 1644 08/26/15 0400  WBC 30.9*  --  11.3*  --   --   --  11.2*  HGB 12.6  < > 11.8*  < > 12.2 12.2  11.6*  HCT 39.0  < > 36.9  < > 36.0 36.0 37.6  PLT 524*  --  492*  --   --   --  496*  < > = values in this interval not displayed.  Coag's  Recent Labs Lab 08/24/15 1650 08/24/15 2140  APTT 90* 25  INR 1.27 1.13    Sepsis Markers  Recent Labs Lab 08/24/15 1650 08/25/15 0410 08/26/15 0400  PROCALCITON 0.21 0.58 0.54    ABG  Recent Labs Lab 08/24/15 1652 08/25/15 0342  PHART 7.214* 7.458*  PCO2ART 53.4* 38.2  PO2ART 193.0* 150*    Liver Enzymes  Recent Labs Lab 08/24/15 1300  AST 94*  ALT 73*  ALKPHOS 123  BILITOT 0.4  ALBUMIN 3.5    Cardiac Enzymes  Recent Labs Lab 08/25/15 0410 08/25/15 1130 08/25/15 1645  TROPONINI 0.65* 0.36* 0.24*    Glucose  Recent Labs Lab 08/25/15 0817 08/25/15 1125 08/25/15 1640 08/25/15 2012 08/25/15 2334 08/26/15 0404  GLUCAP 88 83 79 77 71 78    Imaging Dg Chest Port 1 View  08/26/2015  CLINICAL DATA:  Acute respiratory failure. Intubated patient. Followup exam. EXAM: PORTABLE CHEST 1 VIEW COMPARISON:  08/24/2015 FINDINGS: Endotracheal tube  projects higher than it did on the prior study, 5.4 cm above the carina. Right internal jugular central venous line and oral/nasogastric tube are stable and well positioned. Lung aeration is improved. There has been a decrease in the interstitial and airspace opacities. Mild persistent lung base opacity is noted, mostly on the left, likely atelectasis. IMPRESSION: 1. Significant improvement in lung aeration since the prior study consistent with improved pulmonary edema. 2. Endotracheal tube now projects higher above the carinal than it did previously, 5.4 cm. 3. No other change. Electronically Signed   By: Lajean Manes M.D.   On: 08/26/2015 07:25     STUDIES:  CT scan 2/17 > no acute abnormality Echo 2/17 > LVEF 40-45%, severe asymmetric septal hypertrophy without dynamic obstruction, suggestive of hypertrophic nonobstructive cardiomyopathy, diffuse hypokinesis, grade 1  diastolic dysfunction EEG [id number] > QTc 530 EKG 2/17 > QTc 645   CULTURES: Sputum 2/17 >>   ANTIBIOTICS: None  SIGNIFICANT EVENTS: 2/17  Admit with Cardiac arrest, LHC with no disease.  Arrest thought related to PSY medications with prolonged QTc  LINES/TUBES: Rt I/J 2/27 >> ET tube 2/27 >>  DISCUSSION: 60 years old female was admitted on 08/24/15 with cardiac arrest ROSC estimated at 10 minutes. Emergent left heart cath with no disease found and hypothermia protocol.  Prolonged Qtc.  ASSESSMENT / PLAN:  PULMONARY A: Acute Respiratory failure due to cardiac arrest Pulmonary edema - improving  P:   PRVC, 8 cc/kg  Rate set at 14 Intermittent CXR  Wean FiO2 / PEEP for sats > 92% SBT / WUA daily  Advance ETT 2 cm   CARDIOVASCULAR A:  Cardiopulmonary arrest - shockable rhythm with AED x2, 10 min CPR.  LHC without CAD Concern for Nonobstructive Hypertrophic Cardiomyopathy on ECHO Bradycardia  Prolonged QTc - in setting of psy medications Hypotension - post arrest + sedation P:  ICU monitoring of hemodynamics Hypothermia protocol  Goal MAP > 85 Aspirin daily Defer Heparin and other antiplatelets to Cardiology. Likely will need cardiac MRI in future for further evaluation Lopressor 25mg  BID + Q3 PRN for SBP >160 Avoid QT prolonging medications   RENAL A:   Mild AKI Hypokalemia P:   Avoid hypotension, ensure adequate hydration Trend BMP / UOP  Replace electrolytes as indicated    GASTROINTESTINAL A:   Obesity  P:   PPI for stress ulcer prophylaxis Initiate TF 2/19  HEMATOLOGIC A:   Leukocytosis - ? Stress response P:  Trend CBC Transfuse per icu protocol  INFECTIOUS A:   No evidence of acute infectious process P:   Follow cultures as above Trend PCT   ENDOCRINE A:   Mild Hyperglycemia  P:   Monitor BS q4 hours SSI coverage  NEUROLOGIC A:   Acute encephalopathy high risk for Anoxic brain injury due to cardiac arrest Bipolar Depression P:    RASS goal: 0 to -1 Daily WUA / SBT  Will need PSY input on alternative medication regimen once ready to restart Await EEG results    FAMILY  - Updates:  Family updated at bedside 2/19.      - Inter-disciplinary family meet or Palliative Care meeting due by:  08/31/15     Noe Gens, NP-C [facility] Pgr: [phone] or if no answer [phone] 08/26/2015, 8:04 AM

## 2015-08-26 NOTE — Progress Notes (Addendum)
Called regarding hypertension.  RN reports pt given oral lopressor one hour ago but remains hypertensive.  Off all sedation.  PRN lopressor administered.  RN also reported difficulty with SpO2 tracing and ABG obtained - see results below.     Blood pressure 184/93, pulse 99, temperature 99.5 F (37.5 C), temperature source Core (Comment), resp. rate 16, height 5\' 7"  (1.702 m), weight 227 lb 13.9 oz (103.36 kg), SpO2 94 %.   ABG    Component Value Date/Time   PHART 7.148* 08/26/2015 1045   PCO2ART 59.6* 08/26/2015 1045   PO2ART 71.0* 08/26/2015 1045   HCO3 20.7 08/26/2015 1045   TCO2 23 08/26/2015 1045   ACIDBASEDEF 9.0* 08/26/2015 1045   O2SAT 88.0 08/26/2015 1045    Plan: Rate adjustment on vent to 18 PRN fentanyl for pain  Hold SBT for 2/19   Noe Gens, NP-C [facility] Pgr: [phone] or if no answer [phone] 08/26/2015, 11:05 AM

## 2015-08-26 NOTE — Progress Notes (Signed)
Right after sedation was turned off, patient was able to follow command, lift arms up and squeeze. Patient was oriented to her new environment.

## 2015-08-26 NOTE — Progress Notes (Signed)
Per Dr. Elsworth Soho, stop normothermia. Water temp remaining <10 degrees C. Normothermia stopped, pads removed and warm blankets applied to patient.

## 2015-08-26 NOTE — Procedures (Signed)
   Current facility-administered medications:  .  0.9 %  sodium chloride infusion, , Intravenous, Continuous, Donita Brooks, NP, Last Rate: 50 mL/hr at 08/26/15 1018 .  0.9 %  sodium chloride infusion, 250 mL, Intravenous, PRN, Sherren Mocha, MD, Stopped at 08/26/15 0700 .  antiseptic oral rinse solution (CORINZ), 7 mL, Mouth Rinse, 10 times per day, Praveen Mannam, MD, 7 mL at 08/26/15 1752 .  chlorhexidine gluconate (PERIDEX) 0.12 % solution 15 mL, 15 mL, Mouth Rinse, BID, Praveen Mannam, MD, 15 mL at 08/26/15 2035 .  feeding supplement (PRO-STAT SUGAR FREE 64) liquid 30 mL, 30 mL, Per Tube, BID, Praveen Mannam, MD, 30 mL at 08/26/15 1018 .  feeding supplement (VITAL HIGH PROTEIN) liquid 1,000 mL, 1,000 mL, Per Tube, Continuous, Praveen Mannam, MD, Last Rate: 20 mL/hr at 08/26/15 1658, 1,000 mL at 08/26/15 1658 .  fentaNYL (SUBLIMAZE) injection 100 mcg, 100 mcg, Intravenous, Q15 min PRN, Donita Brooks, NP, 100 mcg at 08/26/15 1344 .  fentaNYL (SUBLIMAZE) injection 100 mcg, 100 mcg, Intravenous, Q2H PRN, Donita Brooks, NP .  heparin injection 5,000 Units, 5,000 Units, Subcutaneous, 3 times per day, Holley Raring, NP, 5,000 Units at 08/26/15 1445 .  insulin aspart (novoLOG) injection 2-6 Units, 2-6 Units, Subcutaneous, 6 times per day, Holley Raring, NP, 2 Units at 08/24/15 1654 .  metoprolol (LOPRESSOR) injection 2.5-5 mg, 2.5-5 mg, Intravenous, Q3H PRN, Kara Mead V, MD, 5 mg at 08/26/15 1752 .  metoprolol tartrate (LOPRESSOR) 25 mg/10 mL oral suspension 25 mg, 25 mg, Per Tube, BID, Roma Schanz, RPH, 25 mg at 08/26/15 1018 .  midazolam (VERSED) bolus via infusion 2 mg, 2 mg, Intravenous, Q30 min PRN, Bincy S Varughese, NP .  pantoprazole sodium (PROTONIX) 40 mg/20 mL oral suspension 40 mg, 40 mg, Per Tube, Daily, Roma Schanz, RPH, 40 mg at 08/26/15 1752 .  [START ON 08/30/2015] pneumococcal 23 valent vaccine (PNU-IMMUNE) injection 0.5 mL, 0.5 mL, Intramuscular, Tomorrow-1000,  Praveen Mannam, MD .  sodium chloride flush (NS) 0.9 % injection 10-40 mL, 10-40 mL, Intracatheter, Q12H, Praveen Mannam, MD, 10 mL at 08/26/15 1020 .  sodium chloride flush (NS) 0.9 % injection 10-40 mL, 10-40 mL, Intracatheter, PRN, Praveen Mannam, MD .  sodium chloride flush (NS) 0.9 % injection 3 mL, 3 mL, Intravenous, Q12H, Sherren Mocha, MD, 3 mL at 08/26/15 1019 .  sodium chloride flush (NS) 0.9 % injection 3 mL, 3 mL, Intravenous, PRN, Sherren Mocha, MD  Introduction:  This is a 19 channel routine scalp EEG performed at the bedside with bipolar and monopolar montages arranged in accordance to the international 10/20 system of electrode placement. One channel was dedicated to EKG recording.    Findings:  The background rhythm showed burst suppression pattern, the burst activity predominantly showing the mid theta 5-6 Hz frequency. Occasionally faster burst frequencies up to 8 Hz were noted . No definite evidence of abnormal epileptiform discharges or electrographic seizures were noted during this recording.   Impression:  This is an abnormal routine inpatient EEG, suggestive of encephalopathy, likely medication effect as patient is on sedation, although cannot exclude hypoxic ischemic encephalopathy based on the clinical history of post cardiac arrest. No evidence of electrographic seizures were seen.  If clinically indicated, consider repeating the EEG when off of sedation. Clinical correlation is recommended .

## 2015-08-26 NOTE — Progress Notes (Signed)
Hypoglycemic Event  CBG: 66  Treatment: D50 IV 50 mL  Symptoms: None  Follow-up CBG: Time:2057 CBG Result: 198  Possible Reasons for Event: Inadequate meal intake and Unknown     Marie Ramos

## 2015-08-26 NOTE — Consult Note (Signed)
Requesting Physician: Dr.  Vaughan Browner    Reason for consultation: Evaluate for anoxic brain injury  HPI:                                                                                                                                         Marie Ramos is an 60 y.o. female patient who was brought to the ER on 2/17/17by the EMS with Cardiac arrest. She was in the school cafeteria and lost her consciousness. School nurse was available immediately and started CPR and placed her on AED,from which she received 2 shocks. EMS arrived and found the Patient in PEA arrest.. One shot of epinephrine was given and the patient regained her pulse back, later patient's rhythm converted into SVT. She was shocked and was converted to NSR at 360 joules. Her total estimated time for ROSC was approximately 10 minutes. After admission to ICU, she was placed on hypothermia protocol, with sedation.   An EEG was done which showed encephalopathy with burst suppression pattern, likely from medication effect. Propofol has been weaned off. She remains intubated, but able to follow simple commands off sedation.   CT head at admission showed no acute pathology.   Past Medical History: Past Medical History  Diagnosis Date  . Bipolar 1 disorder [facility])     Past Surgical History  Procedure Laterality Date  . Cardiac catheterization N/A 08/24/2015    Procedure: Left Heart Cath and Coronary Angiography;  Surgeon: Sherren Mocha, MD;  Location: [facility];  Service: Cardiovascular;  Laterality: N/A;    Family History: No family history on file.  Social History:   has no tobacco, alcohol, and drug history on file.  Allergies:  Allergies  Allergen Reactions  . Naproxen Itching  . Vicodin [Hydrocodone-Acetaminophen] Itching     Medications:                                                                                                                         Current facility-administered medications:   .  0.9 %  sodium chloride infusion, , Intravenous, Continuous, Donita Brooks, NP, Last Rate: 50 mL/hr at 08/26/15 1018 .  0.9 %  sodium chloride infusion, 250 mL, Intravenous, PRN, Sherren Mocha, MD, Stopped at 08/26/15 0700 .  antiseptic oral rinse solution (CORINZ), 7 mL, Mouth Rinse, 10 times per day, Praveen Mannam, MD, 7 mL  at 08/26/15 1752 .  chlorhexidine gluconate (PERIDEX) 0.12 % solution 15 mL, 15 mL, Mouth Rinse, BID, Praveen Mannam, MD, 15 mL at 08/26/15 2035 .  feeding supplement (PRO-STAT SUGAR FREE 64) liquid 30 mL, 30 mL, Per Tube, BID, Praveen Mannam, MD, 30 mL at 08/26/15 1018 .  feeding supplement (VITAL HIGH PROTEIN) liquid 1,000 mL, 1,000 mL, Per Tube, Continuous, Praveen Mannam, MD, Last Rate: 20 mL/hr at 08/26/15 1658, 1,000 mL at 08/26/15 1658 .  fentaNYL (SUBLIMAZE) injection 100 mcg, 100 mcg, Intravenous, Q15 min PRN, Donita Brooks, NP, 100 mcg at 08/26/15 1344 .  fentaNYL (SUBLIMAZE) injection 100 mcg, 100 mcg, Intravenous, Q2H PRN, Donita Brooks, NP .  heparin injection 5,000 Units, 5,000 Units, Subcutaneous, 3 times per day, Holley Raring, NP, 5,000 Units at 08/26/15 1445 .  insulin aspart (novoLOG) injection 2-6 Units, 2-6 Units, Subcutaneous, 6 times per day, Holley Raring, NP, 2 Units at 08/24/15 1654 .  metoprolol (LOPRESSOR) injection 2.5-5 mg, 2.5-5 mg, Intravenous, Q3H PRN, Kara Mead V, MD, 5 mg at 08/26/15 1752 .  metoprolol tartrate (LOPRESSOR) 25 mg/10 mL oral suspension 25 mg, 25 mg, Per Tube, BID, Roma Schanz, RPH, 25 mg at 08/26/15 1018 .  midazolam (VERSED) bolus via infusion 2 mg, 2 mg, Intravenous, Q30 min PRN, Bincy S Varughese, NP .  pantoprazole sodium (PROTONIX) 40 mg/20 mL oral suspension 40 mg, 40 mg, Per Tube, Daily, Roma Schanz, RPH, 40 mg at 08/26/15 1752 .  [START ON 08/30/2015] pneumococcal 23 valent vaccine (PNU-IMMUNE) injection 0.5 mL, 0.5 mL, Intramuscular, Tomorrow-1000, Praveen Mannam, MD .  sodium chloride flush  (NS) 0.9 % injection 10-40 mL, 10-40 mL, Intracatheter, Q12H, Praveen Mannam, MD, 10 mL at 08/26/15 1020 .  sodium chloride flush (NS) 0.9 % injection 10-40 mL, 10-40 mL, Intracatheter, PRN, Praveen Mannam, MD .  sodium chloride flush (NS) 0.9 % injection 3 mL, 3 mL, Intravenous, Q12H, Sherren Mocha, MD, 3 mL at 08/26/15 1019 .  sodium chloride flush (NS) 0.9 % injection 3 mL, 3 mL, Intravenous, PRN, Sherren Mocha, MD   ROS:                                                                                                                                       History   unobtainable from patient due to intubated.    Neurologic Examination:  Blood pressure 172/77, pulse 75, temperature 99.5 F (37.5 C), temperature source Core (Comment), resp. rate 24, height 5\' 7"  (1.702 m), weight 103.36 kg (227 lb 13.9 oz), SpO2 100 %.  Limited exam. Intubated, drowsy, easily arousable to verbal command. Nods yes/no, able to elevate limbs to commands. No gaze deviation, full eom. Not in distress.    Lab Results: Basic Metabolic Panel:  Recent Labs Lab 08/24/15 1838  08/25/15 0410  08/25/15 1644 08/25/15 2000 08/25/15 2353 08/26/15 0400 08/26/15 0801  NA  --   < > 146*  < > 145 146* 146* 145 141  K  --   < > 3.3*  < > 3.7 3.5 3.6 3.9 4.2  CL  --   < > 115*  < > 115* 117* 118* 117* 114*  CO2  --   --  21*  --   --  20* 19* 21* 19*  GLUCOSE  --   < > 104*  < > 94 92 90 93 107*  BUN  --   < > 19  < > 19 18 17 17 16   CREATININE  --   < > 0.68  < > 0.50 0.67 0.65 0.78 0.85  CALCIUM  --   --  7.7*  --   --  7.6* 7.6* 7.8* 7.7*  MG 2.2  --  1.9  --   --   --   --   --   --   PHOS 4.1  --  2.5  --   --   --   --   --   --   < > = values in this interval not displayed.  Liver Function Tests:  Recent Labs Lab 08/24/15 1300  AST 94*  ALT 73*  ALKPHOS 123  BILITOT 0.4  PROT 6.8  ALBUMIN 3.5   No  results for input(s): LIPASE, AMYLASE in the last 168 hours. No results for input(s): AMMONIA in the last 168 hours.  CBC:  Recent Labs Lab 08/24/15 1300  08/25/15 0410 08/25/15 0819 08/25/15 1125 08/25/15 1644 08/26/15 0400  WBC 30.9*  --  11.3*  --   --   --  11.2*  HGB 12.6  < > 11.8* 12.2 12.2 12.2 11.6*  HCT 39.0  < > 36.9 36.0 36.0 36.0 37.6  MCV 91.5  --  90.7  --   --   --  92.6  PLT 524*  --  492*  --   --   --  496*  < > = values in this interval not displayed.  Cardiac Enzymes:  Recent Labs Lab 08/24/15 1650 08/25/15 0050 08/25/15 0410 08/25/15 1130 08/25/15 1645  TROPONINI 1.16* 0.86* 0.65* 0.36* 0.24*    Lipid Panel: No results for input(s): CHOL, TRIG, HDL, CHOLHDL, VLDL, LDLCALC in the last 168 hours.  CBG:  Recent Labs Lab 08/26/15 0404 08/26/15 0759 08/26/15 1319 08/26/15 1532 08/26/15 2032  GLUCAP 78 97 100* 91 37    Microbiology: Results for orders placed or performed during the hospital encounter of 08/24/15  Culture, respiratory (NON-Expectorated)     Status: None (Preliminary result)   Collection Time: 08/24/15  5:08 PM  Result Value Ref Range Status   Specimen Description TRACHEAL ASPIRATE  Final   Special Requests NONE  Final   Gram Stain   Final    ABUNDANT WBC PRESENT,BOTH PMN AND MONONUCLEAR FEW SQUAMOUS EPITHELIAL CELLS PRESENT ABUNDANT GRAM POSITIVE COCCI IN PAIRS IN CHAINS IN CLUSTERS FEW [place] NEGATIVE RODS Performed  at [employer]   Final    Culture reincubated for better growth Performed at [facility]    Report Status PENDING  Incomplete  MRSA PCR Screening     Status: None   Collection Time: 08/25/15 12:31 PM  Result Value Ref Range Status   MRSA by PCR NEGATIVE NEGATIVE Final    Comment:        The GeneXpert MRSA Assay (FDA approved for NASAL specimens only), is one component of a comprehensive MRSA colonization surveillance program. It is not intended to diagnose  MRSA infection nor to guide or monitor treatment for MRSA infections.      Imaging: Ct Head Wo Contrast  08/24/2015  CLINICAL DATA:  Altered mental status.  Patient is intubated. EXAM: CT HEAD WITHOUT CONTRAST TECHNIQUE: Contiguous axial images were obtained from the base of the skull through the vertex without contrast. COMPARISON:  None FINDINGS: No evidence for acute hemorrhage, mass lesion, midline shift, hydrocephalus or large infarct. Mucosal thickening in the ethmoid air cells. No calvarial fracture. IMPRESSION: No acute intracranial abnormality. Electronically Signed   By: Markus Daft M.D.   On: 08/24/2015 15:24   Dg Chest Port 1 View  08/26/2015  CLINICAL DATA:  Acute respiratory failure. Intubated patient. Followup exam. EXAM: PORTABLE CHEST 1 VIEW COMPARISON:  08/24/2015 FINDINGS: Endotracheal tube projects higher than it did on the prior study, 5.4 cm above the carina. Right internal jugular central venous line and oral/nasogastric tube are stable and well positioned. Lung aeration is improved. There has been a decrease in the interstitial and airspace opacities. Mild persistent lung base opacity is noted, mostly on the left, likely atelectasis. IMPRESSION: 1. Significant improvement in lung aeration since the prior study consistent with improved pulmonary edema. 2. Endotracheal tube now projects higher above the carinal than it did previously, 5.4 cm. 3. No other change. Electronically Signed   By: Lajean Manes M.D.   On: 08/26/2015 07:25   Dg Chest Portable 1 View  08/24/2015  CLINICAL DATA:  60 year old female status post cardiac arrest. A central line placement and endotracheal tube repositioning. EXAM: PORTABLE CHEST 1 VIEW COMPARISON:  Prior chest x-ray obtained earlier today at 13:25 p.m. FINDINGS: The endotracheal tube has been retracted and is now 2.7 cm above the carina. Right IJ central venous catheter is been placed. The catheter tip overlies the superior cavoatrial junction.  Stable cardiomegaly. Slight interval progression of pulmonary edema. Unchanged orogastric tube. Again suspect nondisplaced left rib fractures. IMPRESSION: 1. New right IJ central venous catheter the tip of which overlies the superior cavoatrial junction. No evidence of pneumothorax or other complication. 2. Repositioning of endotracheal tube which is now 2.7 cm above the carina. 3. Slight interval progression of pulmonary edema. Electronically Signed   By: Jacqulynn Cadet M.D.   On: 08/24/2015 15:00   Dg Chest Port 1 View  08/24/2015  CLINICAL DATA:  60 year old female status post cardiac arrest and intubation EXAM: PORTABLE CHEST 1 VIEW COMPARISON:  None FINDINGS: Right mainstem intubation. An orogastric tube is present and is coiled within the stomach. External defibrillator pads project over the chest. Mild cardiomegaly. Pulmonary vascular congestion with mild edema. Probable bibasilar atelectasis. No pneumothorax or large pleural effusion. Slight irregularity of the lateral aspect of several left-sided ribs. Nondisplaced rib fractures not excluded. IMPRESSION: 1. Right mainstem intubation. Recommend withdrawing the endotracheal tube 4.5 cm for more optimal placement above the carina. 2. Cardiomegaly and mild pulmonary vascular congestion suggests CHF.  3. Well-positioned gastric tube. 4. Subtle irregularities the lateral aspect of several left-sided ribs. Nondisplaced fractures are not excluded. These results were called by telephone at the time of interpretation on 08/24/2015 at 1:41 pm to Dr. Lajean Saver , who verbally acknowledged these results. Electronically Signed   By: Jacqulynn Cadet M.D.   On: 08/24/2015 13:41    Assessment and plan:   Marie Ramos is an 60 y.o. female patient who is s/p cardiac arrest as described above. Initial EEG showed no seizures. At this time, she is off sedation, remains intubated. She is following simple commands. Likely good prognosis for recovery. Consider  brain mri after extubation, when stable to evaluate for any brain injury secondary to cardiac arrest. Otherwise, she is doing well clinically. No further recs from neuro stand point.  Will f/u PRN. Pease call if any questions.

## 2015-08-26 NOTE — Progress Notes (Signed)
SUBJECTIVE: Remains intubated, starting to follow commands    . antiseptic oral rinse  7 mL Mouth Rinse 10 times per day  . artificial tears  1 application Both Eyes 3 times per day  . chlorhexidine gluconate  15 mL Mouth Rinse BID  . feeding supplement (VITAL HIGH PROTEIN)  1,000 mL Per Tube Q24H  . heparin  5,000 Units Subcutaneous 3 times per day  . insulin aspart  2-6 Units Subcutaneous 6 times per day  . pantoprazole (PROTONIX) IV  40 mg Intravenous Q24H  . sodium chloride flush  10-40 mL Intracatheter Q12H  . sodium chloride flush  3 mL Intravenous Q12H   . sodium chloride 75 mL/hr at 08/26/15 [id number]  . fentaNYL infusion INTRAVENOUS Stopped (08/26/15 [id number])  . midazolam (VERSED) infusion Stopped (08/26/15 0612)  . norepinephrine (LEVOPHED) Adult infusion      OBJECTIVE: Physical Exam: Filed Vitals:   08/26/15 0315 08/26/15 0400 08/26/15 0500 08/26/15 0600  BP: 176/80     Pulse: 84     Temp:  96.8 F (36 C) 97.2 F (36.2 C) 98.4 F (36.9 C)  TempSrc:   Core (Comment) Core (Comment)  Resp: 14 14 16 21   Height:      Weight:   227 lb 13.9 oz (103.36 kg)   SpO2: 100%  100%     Intake/Output Summary (Last 24 hours) at 08/26/15 0827 Last data filed at 08/26/15 0600  Gross per 24 hour  Intake 2830.13 ml  Output    988 ml  Net 1842.13 ml    Telemetry reveals sinus rhythm, no arrhythmias  GEN- The patient is ill appearing, intubated Head- normocephalic, atraumatic Eyes-  Sclera clear, conjunctiva pink Ears- hearing intact Oropharynx- ETT Neck- supple,  Lungs- Clear to ausculation bilaterally, normal work of  breathing Heart- Regular rate and rhythm  GI- soft, NT, ND, + BS Extremities- no clubbing, cyanosis, + dependant edema Skin- no rash or lesion Psych- intubated, wakes to touch Neuro-intubated, wakes to touch  LABS: Basic Metabolic Panel:  Recent Labs  08/24/15 1838  08/25/15 0410  08/25/15 2353 08/26/15 0400  NA  --   < > 146*  < > 146* 145  K   --   < > 3.3*  < > 3.6 3.9  CL  --   < > 115*  < > 118* 117*  CO2  --   --  21*  < > 19* 21*  GLUCOSE  --   < > 104*  < > 90 93  BUN  --   < > 19  < > 17 17  CREATININE  --   < > 0.68  < > 0.65 0.78  CALCIUM  --   --  7.7*  < > 7.6* 7.8*  MG 2.2  --  1.9  --   --   --   PHOS 4.1  --  2.5  --   --   --   < > = values in this interval not displayed. Liver Function Tests:  Recent Labs  08/24/15 1300  AST 94*  ALT 73*  ALKPHOS 123  BILITOT 0.4  PROT 6.8  ALBUMIN 3.5   No results for input(s): LIPASE, AMYLASE in the last 72 hours. CBC:  Recent Labs  08/25/15 0410  08/25/15 1644 08/26/15 0400  WBC 11.3*  --   --  11.2*  HGB 11.8*  < > 12.2 11.6*  HCT 36.9  < > 36.0 37.6  MCV 90.7  --   --  92.6  PLT 492*  --   --  496*  < > = values in this interval not displayed. Cardiac Enzymes:  Recent Labs  08/25/15 0410 08/25/15 1130 08/25/15 1645  TROPONINI 0.65* 0.36* 0.24*   RADIOLOGY: Ct Head Wo Contrast  08/24/2015  CLINICAL DATA:  Altered mental status.  Patient is intubated. EXAM: CT HEAD WITHOUT CONTRAST TECHNIQUE: Contiguous axial images were obtained from the base of the skull through the vertex without contrast. COMPARISON:  None FINDINGS: No evidence for acute hemorrhage, mass lesion, midline shift, hydrocephalus or large infarct. Mucosal thickening in the ethmoid air cells. No calvarial fracture. IMPRESSION: No acute intracranial abnormality. Electronically Signed   By: Markus Daft M.D.   On: 08/24/2015 15:24   Dg Chest Port 1 View  08/26/2015  CLINICAL DATA:  Acute respiratory failure. Intubated patient. Followup exam. EXAM: PORTABLE CHEST 1 VIEW COMPARISON:  08/24/2015 FINDINGS: Endotracheal tube projects higher than it did on the prior study, 5.4 cm above the carina. Right internal jugular central venous line and oral/nasogastric tube are stable and well positioned. Lung aeration is improved. There has been a decrease in the interstitial and airspace opacities. Mild  persistent lung base opacity is noted, mostly on the left, likely atelectasis. IMPRESSION: 1. Significant improvement in lung aeration since the prior study consistent with improved pulmonary edema. 2. Endotracheal tube now projects higher above the carinal than it did previously, 5.4 cm. 3. No other change. Electronically Signed   By: Lajean Manes M.D.   On: 08/26/2015 07:25   Dg Chest Portable 1 View  08/24/2015  CLINICAL DATA:  60 year old female status post cardiac arrest. A central line placement and endotracheal tube repositioning. EXAM: PORTABLE CHEST 1 VIEW COMPARISON:  Prior chest x-ray obtained earlier today at 13:25 p.m. FINDINGS: The endotracheal tube has been retracted and is now 2.7 cm above the carina. Right IJ central venous catheter is been placed. The catheter tip overlies the superior cavoatrial junction. Stable cardiomegaly. Slight interval progression of pulmonary edema. Unchanged orogastric tube. Again suspect nondisplaced left rib fractures. IMPRESSION: 1. New right IJ central venous catheter the tip of which overlies the superior cavoatrial junction. No evidence of pneumothorax or other complication. 2. Repositioning of endotracheal tube which is now 2.7 cm above the carina. 3. Slight interval progression of pulmonary edema. Electronically Signed   By: Jacqulynn Cadet M.D.   On: 08/24/2015 15:00   Dg Chest Port 1 View  08/24/2015  CLINICAL DATA:  60 year old female status post cardiac arrest and intubation EXAM: PORTABLE CHEST 1 VIEW COMPARISON:  None FINDINGS: Right mainstem intubation. An orogastric tube is present and is coiled within the stomach. External defibrillator pads project over the chest. Mild cardiomegaly. Pulmonary vascular congestion with mild edema. Probable bibasilar atelectasis. No pneumothorax or large pleural effusion. Slight irregularity of the lateral aspect of several left-sided ribs. Nondisplaced rib fractures not excluded. IMPRESSION: 1. Right mainstem  intubation. Recommend withdrawing the endotracheal tube 4.5 cm for more optimal placement above the carina. 2. Cardiomegaly and mild pulmonary vascular congestion suggests CHF. 3. Well-positioned gastric tube. 4. Subtle irregularities the lateral aspect of several left-sided ribs. Nondisplaced fractures are not excluded. These results were called by telephone at the time of interpretation on 08/24/2015 at 1:41 pm to Dr. Lajean Saver , who verbally acknowledged these results. Electronically Signed   By: Jacqulynn Cadet M.D.   On: 08/24/2015 13:41    ASSESSMENT AND PLAN:  Active Problems:   Cardiac arrest ([place]) 1. VF/PEA arrest  The patient had an OOH resuscitated cardiac arrest presumed to be VF with AED advising shocks. She received immediate bystander CPR. Estimated downtime 10 minutes. Cath Friday with no CAD.  Echo suggestive of possible nonobstructive hypertrophic CM.  I had a long discussion with the family.  There is no FH of sudden death to their knowledge.   QT prolonged on admission in the setting of psychiatric drugs before initiation of cooling. I suspect that this was the cause of her arrest. No further arrhythmias noted. Will add metoprolol via tube today. Continue supportive care per CCM, she is rewarming. Keep K >3.9, Mg >1.8 Will need to re-evaluate QT once patient is rewarmed and psychotropic medicines have washed out. Avoid QT prolonging medications Cardiac MRI to evaluate for HCM once clinically improved and extubated (several days)  2. Elevated troponin Due to demand ischemia, cath with no CAD  3. Psych Will reassess once off psychotropic medicines Given prolonged qt, this presents a problem. We may have to consult psychiatry for non qt prolonging options once she is recovered/ awake   This is a very complicated situation in a very ill patient. A high level of decision making was required for this visit    Thompson Grayer, MD 08/26/2015 8:27 AM

## 2015-08-26 NOTE — Progress Notes (Signed)
Versed gtt 3mL and Fentanyl gtt 27mL wasted in sink, witnessed x2 RNs (H. Edythe Riches, RN and C. Rodolph Bong, RN)

## 2015-08-27 ENCOUNTER — Inpatient Hospital Stay (HOSPITAL_COMMUNITY): Payer: BLUE CROSS/BLUE SHIELD

## 2015-08-27 LAB — BASIC METABOLIC PANEL
Anion gap: 5 (ref 5–15)
BUN: 29 mg/dL — AB (ref 6–20)
CALCIUM: 8.5 mg/dL — AB (ref 8.9–10.3)
CHLORIDE: 120 mmol/L — AB (ref 101–111)
CO2: 20 mmol/L — ABNORMAL LOW (ref 22–32)
Creatinine, Ser: 1.14 mg/dL — ABNORMAL HIGH (ref 0.44–1.00)
GFR, EST AFRICAN AMERICAN: 59 mL/min — AB (ref >60)
GFR, EST NON AFRICAN AMERICAN: 51 mL/min — AB (ref >60)
Glucose, Bld: 107 mg/dL — ABNORMAL HIGH (ref 65–99)
POTASSIUM: 3.8 mmol/L (ref 3.5–5.1)
SODIUM: 145 mmol/L (ref 135–145)

## 2015-08-27 LAB — CBC
HCT: 33.8 % — ABNORMAL LOW (ref 36.0–46.0)
HEMOGLOBIN: 10.4 g/dL — AB (ref 12.0–15.0)
MCH: 28.6 pg (ref 26.0–34.0)
MCHC: 30.8 g/dL (ref 30.0–36.0)
MCV: 92.9 fL (ref 78.0–100.0)
Platelets: 436 10*3/uL — ABNORMAL HIGH (ref 150–400)
RBC: 3.64 MIL/uL — AB (ref 3.87–5.11)
RDW: 16.3 % — ABNORMAL HIGH (ref 11.5–15.5)
WBC: 10.5 10*3/uL (ref 4.0–10.5)

## 2015-08-27 LAB — GLUCOSE, CAPILLARY
GLUCOSE-CAPILLARY: 120 mg/dL — AB (ref 65–99)
GLUCOSE-CAPILLARY: 110 mg/dL — AB (ref 65–99)
Glucose-Capillary: 103 mg/dL — ABNORMAL HIGH (ref 65–99)
Glucose-Capillary: 106 mg/dL — ABNORMAL HIGH (ref 65–99)
Glucose-Capillary: 111 mg/dL — ABNORMAL HIGH (ref 65–99)
Glucose-Capillary: 112 mg/dL — ABNORMAL HIGH (ref 65–99)
Glucose-Capillary: 92 mg/dL (ref 65–99)

## 2015-08-27 LAB — BLOOD GAS, ARTERIAL
ACID-BASE DEFICIT: 5.9 mmol/L — AB (ref 0.0–2.0)
BICARBONATE: 18.4 meq/L — AB (ref 20.0–24.0)
Drawn by: 449561
FIO2: 0.6
O2 SAT: 98.4 %
PEEP/CPAP: 5 cmH2O
PH ART: 7.365 (ref 7.350–7.450)
Patient temperature: 98.6
RATE: 24 resp/min
TCO2: 19.4 mmol/L (ref 0–100)
VT: 500 mL
pCO2 arterial: 33 mmHg — ABNORMAL LOW (ref 35.0–45.0)
pO2, Arterial: 121 mmHg — ABNORMAL HIGH (ref 80.0–100.0)

## 2015-08-27 LAB — PHOSPHORUS: PHOSPHORUS: 2.2 mg/dL — AB (ref 2.5–4.6)

## 2015-08-27 LAB — CULTURE, RESPIRATORY W GRAM STAIN: Culture: NORMAL

## 2015-08-27 LAB — POCT I-STAT 3, ART BLOOD GAS (G3+)
ACID-BASE DEFICIT: 6 mmol/L — AB (ref 0.0–2.0)
Bicarbonate: 20.4 mEq/L (ref 20.0–24.0)
O2 Saturation: 92 %
PH ART: 7.245 — AB (ref 7.350–7.450)
TCO2: 22 mmol/L (ref 0–100)
pCO2 arterial: 47.6 mmHg — ABNORMAL HIGH (ref 35.0–45.0)
pO2, Arterial: 79 mmHg — ABNORMAL LOW (ref 80.0–100.0)

## 2015-08-27 LAB — MAGNESIUM: Magnesium: 1.9 mg/dL (ref 1.7–2.4)

## 2015-08-27 LAB — CULTURE, RESPIRATORY

## 2015-08-27 MED ORDER — HYDRALAZINE HCL 20 MG/ML IJ SOLN
10.0000 mg | INTRAMUSCULAR | Status: DC | PRN
  Administered 2015-08-27 – 2015-08-30 (×7): 10 mg via INTRAVENOUS
  Filled 2015-08-27 (×7): qty 1

## 2015-08-27 MED ORDER — FUROSEMIDE 10 MG/ML IJ SOLN
40.0000 mg | Freq: Three times a day (TID) | INTRAMUSCULAR | Status: DC
  Administered 2015-08-27 – 2015-08-29 (×7): 40 mg via INTRAVENOUS
  Filled 2015-08-27 (×7): qty 4

## 2015-08-27 MED ORDER — ANTISEPTIC ORAL RINSE SOLUTION (CORINZ)
7.0000 mL | Freq: Four times a day (QID) | OROMUCOSAL | Status: DC
  Administered 2015-08-27 – 2015-08-28 (×2): 7 mL via OROMUCOSAL

## 2015-08-27 MED ORDER — CHLORHEXIDINE GLUCONATE 0.12% ORAL RINSE (MEDLINE KIT)
15.0000 mL | Freq: Two times a day (BID) | OROMUCOSAL | Status: DC
  Administered 2015-08-28 – 2015-09-04 (×10): 15 mL via OROMUCOSAL

## 2015-08-27 MED ORDER — FUROSEMIDE 10 MG/ML IJ SOLN
INTRAMUSCULAR | Status: AC
  Filled 2015-08-27: qty 4

## 2015-08-27 NOTE — Progress Notes (Signed)
SUBJECTIVE: Remains intubated, is awake and following commands.  Remains intubated.    Marland Kitchen antiseptic oral rinse  7 mL Mouth Rinse 10 times per day  . chlorhexidine gluconate  15 mL Mouth Rinse BID  . feeding supplement (PRO-STAT SUGAR FREE 64)  30 mL Per Tube BID  . heparin  5,000 Units Subcutaneous 3 times per day  . insulin aspart  2-6 Units Subcutaneous 6 times per day  . metoprolol tartrate  25 mg Per Tube BID  . pantoprazole sodium  40 mg Per Tube Daily  . [START ON 08/30/2015] pneumococcal 23 valent vaccine  0.5 mL Intramuscular Tomorrow-1000  . sodium chloride flush  10-40 mL Intracatheter Q12H  . sodium chloride flush  3 mL Intravenous Q12H   . sodium chloride 50 mL/hr at 08/27/15 0216  . feeding supplement (VITAL HIGH PROTEIN) 1,000 mL (08/27/15 0535)    OBJECTIVE: Physical Exam: Filed Vitals:   08/27/15 0400 08/27/15 0421 08/27/15 0500 08/27/15 0600  BP:  155/76    Pulse: 67 69 70 67  Temp: 100.2 F (37.9 C)  100.2 F (37.9 C) 100 F (37.8 C)  TempSrc: Core (Comment)     Resp: 24 24 24 24   Height:      Weight:   229 lb 0.9 oz (103.9 kg)   SpO2: 99% 98% 98% 99%    Intake/Output Summary (Last 24 hours) at 08/27/15 [id number] Last data filed at 08/27/15 0600  Gross per 24 hour  Intake 1959.33 ml  Output   1200 ml  Net 759.33 ml    Telemetry reveals sinus rhythm, no arrhythmias  GEN- The patient is obese, awake and following commands, intubated Head- normocephalic, atraumatic Eyes-  Sclera clear, conjunctiva pink Ears- hearing intact Oropharynx- ETT Neck- supple,  Lungs- Clear to ausculation bilaterally, normal work of  breathing Heart- Regular rate and rhythm  GI- soft, NT, ND Extremities- no clubbing, cyanosis, + dependant edema Skin- no rash or lesion Psych- intubated, following commands Neuro-intubated, following commnds appropriately  LABS: Basic Metabolic Panel:  Recent Labs  08/24/15 1838  08/25/15 0410  08/26/15 0801 08/27/15 0344  NA   --   < > 146*  < > 141 145  K  --   < > 3.3*  < > 4.2 3.8  CL  --   < > 115*  < > 114* 120*  CO2  --   --  21*  < > 19* 20*  GLUCOSE  --   < > 104*  < > 107* 107*  BUN  --   < > 19  < > 16 29*  CREATININE  --   < > 0.68  < > 0.85 1.14*  CALCIUM  --   --  7.7*  < > 7.7* 8.5*  MG 2.2  --  1.9  --   --   --   PHOS 4.1  --  2.5  --   --   --   < > = values in this interval not displayed. Liver Function Tests:  Recent Labs  08/24/15 1300  AST 94*  ALT 73*  ALKPHOS 123  BILITOT 0.4  PROT 6.8  ALBUMIN 3.5   No results for input(s): LIPASE, AMYLASE in the last 72 hours. CBC:  Recent Labs  08/26/15 0400 08/27/15 0344  WBC 11.2* 10.5  HGB 11.6* 10.4*  HCT 37.6 33.8*  MCV 92.6 92.9  PLT 496* 436*   Cardiac Enzymes:  Recent Labs  08/25/15 0410 08/25/15 1130 08/25/15  1645  TROPONINI 0.65* 0.36* 0.24*   RADIOLOGY: Dg Chest Port 1 View 08/27/2015  CLINICAL DATA:  Acute respiratory failure. EXAM: PORTABLE CHEST 1 VIEW COMPARISON:  08/26/2015. FINDINGS: Endotracheal tube, NG tube, right IJ line stable position. Cardiomegaly with slight increase in pulmonary venous congestion and bibasilar infiltrates. Bilateral pleural effusions. Findings consistent congestive heart failure. Findings have worsened from prior exam . IMPRESSION: 1. Lines and tubes in stable position. 2. Cardiomegaly with increase pulmonary venous congestion and bibasilar infiltrates. Bilateral small pleural effusions noted. Findings consistent with worsening congestive heart failure. Electronically Signed   By: [place]   On: 08/27/2015 07:04      ASSESSMENT AND PLAN:  Active Problems:   Cardiac arrest ([place])   Acute respiratory failure ([place]) 1. VF/PEA arrest The patient had an OOH resuscitated cardiac arrest presumed to be VF with AED advising shocks. She received immediate bystander CPR. Estimated downtime 10 minutes. Cath Friday with no CAD.  Echo suggestive of possible nonobstructive hypertrophic  CM.    S/p hypothermia protocol She remains intubated this AM, is awake and following commands, extubate timing as per CCM.  Dr. Rayann Heman had a long discussion with the family over the weekend.  There is no FH of sudden death to their knowledge.   QT prolonged on admission in the setting of psychiatric drugs before initiation of cooling. Suspected that this was the cause of her arrest. No further arrhythmias noted. Will add metoprolol via tube today.   Keep K >3.9, Mg >1.8 Will need to re-evaluate QT once patient is rewarmed and psychotropic medicines have washed out. Avoid QT prolonging medications Cardiac MRI to evaluate for HCM once clinically improved and extubated (several days)  2. Elevated troponin Due to demand ischemia, cath with no CAD  3. Psych Will need reassessment once off psychotropic medicines Given prolonged qt, this presents a problem. We may have to consult psychiatry for non qt prolonging options once she is recovered/ awake     Renee Dyane Dustman, PA-C 08/27/2015 7:18 AM   Will await extubation  [place] with need for MR I am not sure why K low afollowing arrest, we see this but would expect to be high with H/K shifting with acidemia

## 2015-08-27 NOTE — Progress Notes (Signed)
PULMONARY / CRITICAL CARE MEDICINE   Name: Marie Ramos MRN: [id number] DOB: [birth year]    ADMISSION DATE:  08/24/2015 CONSULTATION DATE: 08/24/15  REFERRING MD:    CHIEF COMPLAINT:  Cardiac arrest   EVENTS 2/19 RN reports pt awake, following commands, BP elevated to [id number] systolic.  Treated with 5mg  IV Lopressor   SUBJECTIVE/OVERNIGHT/INTERVAL HX 08/27/15 - normal wua. 40% fio2 -> pulse ox 92%. Just starting SBT  VITAL SIGNS: BP 155/76 mmHg  Pulse 72  Temp(Src) 99.9 F (37.7 C) (Core (Comment))  Resp 23  Ht 5\' 7"  (1.702 m)  Wt 103.9 kg (229 lb 0.9 oz)  BMI 35.87 kg/m2  SpO2 97%  HEMODYNAMICS: CVP:  [12 mmHg-15 mmHg] 14 mmHg  VENTILATOR SETTINGS: Vent Mode:  [-] PRVC FiO2 (%):  [40 %-100 %] 50 % Set Rate:  [14 bmp-24 bmp] 24 bmp Vt Set:  [500 mL] 500 mL PEEP:  [5 cmH20] 5 cmH20 Pressure Support:  [5 cmH20] 5 cmH20 Plateau Pressure:  [20 cmH20-27 cmH20] 23 cmH20  INTAKE / OUTPUT: I/O last 3 completed shifts: In: 3413.5 [I.V.:2699.7; Other:80; NG/GT:633.8] Out: [id number] [Urine:1785]  PHYSICAL EXAMINATION: General:  Obese female, on vent in NAD Neuro:  Arouses to voice, follows two step commands, MAE HEENT:  Normocephalic, atraumatic. Non icteric.  Cardiovascular:  S1,S2 rrr, rrr, no mumur, gallop or rub noted. Lungs: Clear bilaterally, with good chest expansion Abdomen: soft, large, with good bowel sounds, cooling pad in place Musculoskeletal:  No acute deformities  Skin:  No rashes or lesions  LABS: PULMONARY  Recent Labs Lab 08/26/15 1045 08/26/15 1321 08/26/15 1536 08/26/15 2016 08/27/15 0345  PHART 7.148* 7.136* 7.170* 7.297* 7.365  PCO2ART 59.6* 57.0* 50.1* 37.4 33.0*  PO2ART 71.0* 81.0 66.0* 96.0 121*  HCO3 20.7 19.3* 18.3* 18.2* 18.4*  TCO2 23 21 20 19  19.4  O2SAT 88.0 92.0 87.0 96.0 98.4    CBC  Recent Labs Lab 08/25/15 0410  08/25/15 1644 08/26/15 0400 08/27/15 0344  HGB 11.8*  < > 12.2 11.6* 10.4*  HCT 36.9  < > 36.0 37.6  33.8*  WBC 11.3*  --   --  11.2* 10.5  PLT 492*  --   --  496* 436*  < > = values in this interval not displayed.  COAGULATION  Recent Labs Lab 08/24/15 1650 08/24/15 2140  INR 1.27 1.13    CARDIAC   Recent Labs Lab 08/24/15 1650 08/25/15 0050 08/25/15 0410 08/25/15 1130 08/25/15 1645  TROPONINI 1.16* 0.86* 0.65* 0.36* 0.24*   No results for input(s): PROBNP in the last 168 hours.   CHEMISTRY  Recent Labs Lab 08/24/15 1838  08/25/15 0410  08/25/15 2000 08/25/15 2353 08/26/15 0400 08/26/15 0801 08/27/15 0344  NA  --   < > 146*  < > 146* 146* 145 141 145  K  --   < > 3.3*  < > 3.5 3.6 3.9 4.2 3.8  CL  --   < > 115*  < > 117* 118* 117* 114* 120*  CO2  --   --  21*  --  20* 19* 21* 19* 20*  GLUCOSE  --   < > 104*  < > 92 90 93 107* 107*  BUN  --   < > 19  < > 18 17 17 16  29*  CREATININE  --   < > 0.68  < > 0.67 0.65 0.78 0.85 1.14*  CALCIUM  --   --  7.7*  --  7.6* 7.6* 7.8* 7.7* 8.5*  MG 2.2  --  1.9  --   --   --   --   --   --   PHOS 4.1  --  2.5  --   --   --   --   --   --   < > = values in this interval not displayed. Estimated Creatinine Clearance: 65 mL/min (by C-G formula based on Cr of 1.14).   LIVER  Recent Labs Lab 08/24/15 1300 08/24/15 1650 08/24/15 2140  AST 94*  --   --   ALT 73*  --   --   ALKPHOS 123  --   --   BILITOT 0.4  --   --   PROT 6.8  --   --   ALBUMIN 3.5  --   --   INR  --  1.27 1.13     INFECTIOUS  Recent Labs Lab 08/24/15 1650 08/25/15 0410 08/26/15 0400  PROCALCITON 0.21 0.58 0.54     ENDOCRINE CBG (last 3)   Recent Labs  08/26/15 2057 08/27/15 0051 08/27/15 0345  GLUCAP 198* 92 103*         IMAGING x48h  - image(s) personally visualized  -   highlighted in bold Dg Chest Port 1 View  08/27/2015  CLINICAL DATA:  Acute respiratory failure. EXAM: PORTABLE CHEST 1 VIEW COMPARISON:  08/26/2015. FINDINGS: Endotracheal tube, NG tube, right IJ line stable position. Cardiomegaly with slight increase  in pulmonary venous congestion and bibasilar infiltrates. Bilateral pleural effusions. Findings consistent congestive heart failure. Findings have worsened from prior exam . IMPRESSION: 1. Lines and tubes in stable position. 2. Cardiomegaly with increase pulmonary venous congestion and bibasilar infiltrates. Bilateral small pleural effusions noted. Findings consistent with worsening congestive heart failure. Electronically Signed   By: Marcello Moores  Register   On: 08/27/2015 07:04   Dg Chest Port 1 View  08/26/2015  CLINICAL DATA:  Acute respiratory failure. Intubated patient. Followup exam. EXAM: PORTABLE CHEST 1 VIEW COMPARISON:  08/24/2015 FINDINGS: Endotracheal tube projects higher than it did on the prior study, 5.4 cm above the carina. Right internal jugular central venous line and oral/nasogastric tube are stable and well positioned. Lung aeration is improved. There has been a decrease in the interstitial and airspace opacities. Mild persistent lung base opacity is noted, mostly on the left, likely atelectasis. IMPRESSION: 1. Significant improvement in lung aeration since the prior study consistent with improved pulmonary edema. 2. Endotracheal tube now projects higher above the carinal than it did previously, 5.4 cm. 3. No other change. Electronically Signed   By: Lajean Manes M.D.   On: 08/26/2015 07:25        STUDIES:  CT scan 2/17 > no acute abnormality Echo 2/17 > LVEF 40-45%, severe asymmetric septal hypertrophy without dynamic obstruction, suggestive of hypertrophic nonobstructive cardiomyopathy, diffuse hypokinesis, grade 1 diastolic dysfunction EEG [id number] > QTc 530 EKG 2/17 > QTc 645   CULTURES: Sputum 2/17 >>   ANTIBIOTICS: None  SIGNIFICANT EVENTS: 2/17  Admit with Cardiac arrest, LHC with no disease.  Arrest thought related to PSY medications with prolonged QTc  LINES/TUBES: Rt I/J 2/27 >> ET tube 2/27 >>  DISCUSSION: 60 years old female was admitted on 08/24/15 with cardiac  arrest ROSC estimated at 10 minutes. Emergent left heart cath with no disease found and hypothermia protocol.  Prolonged Qtc.  ASSESSMENT / PLAN:  PULMONARY A: Acute Respiratory failure due to cardiac arrest Pulmonary edema - improving    - started sbt but  pulm edema on cxr an d50% fio2 need and ABG with pH 7.3 still might make extubatin risky P:   Do SBT -> check abg and assess for extuibation Start diuresis    CARDIOVASCULAR A:  Cardiopulmonary arrest - shockable rhythm with AED x2, 10 min CPR.  LHC without CAD Concern for Nonobstructive Hypertrophic Cardiomyopathy on ECHO Bradycardia  Prolonged QTc - in setting of psy medications Hypotension - post arrest + sedation   - normal hemodynamic. EKG pending. Looks volume overloaded   P:  Start lasix - cards to adjust if concern due to HOCM but looks overloadded on CXR Goal MAP > 65 Aspirin daily Defer Heparin and other antiplatelets to Cardiology. Likely will need cardiac MRI in future for further evaluation Lopressor 25mg  BID + Q3 PRN for SBP >160 Avoid QT prolonging medications  -> recheck EKG 08/27/2015   RENAL A:   Mild AKI - resolved. At risk for hypomag and  hypophos  P:   Check mag andphos Avoid hypotension, ensure adequate hydration Trend BMP / UOP  Replace electrolytes as indicated    GASTROINTESTINAL A:   Obesity  P:   PPI for stress ulcer prophylaxis Initiate TF 2/19  HEMATOLOGIC A:   Leukocytosis - ? Stress response P:  Trend CBC Transfuse per icu protocol  INFECTIOUS A:   No evidence of acute infectious process P:   Follow cultures as above Trend PCT   ENDOCRINE A:   Mild Hyperglycemia  P:   Monitor BS q4 hours SSI coverage  NEUROLOGIC A:   Acute encephalopathy high risk for Anoxic brain injury due to cardiac arrest Bipolar Depression   - normal mental status on WUA P:   RASS goal: 0 to -1 Daily WUA / SBT  PRN Sedation Will need PSY input on alternative medication regimen  once ready to restart Await EEG results    FAMILY  - Updates:  Family updated at bedside 2/19.     None at bedside 08/27/15  - Inter-disciplinary family meet or Palliative Care meeting due by:  08/31/15          The patient is critically ill with multiple organ systems failure and requires high complexity decision making for assessment and support, frequent evaluation and titration of therapies, application of advanced monitoring technologies and extensive interpretation of multiple databases.   Critical Care Time devoted to patient care services described in this note is  30  Minutes. This time reflects time of care of this signee Dr Brand Males. This critical care time does not reflect procedure time, or teaching time or supervisory time of PA/NP/Med student/Med Resident etc but could involve care discussion time    Dr. Brand Males, M.D., [facility].C.P Pulmonary and Critical Care Medicine Staff Physician [place] [facility] Pager: [phone], If no answer or between  15:00h - 7:00h: call [phone]  08/27/2015 8:21 AM

## 2015-08-27 NOTE — Progress Notes (Signed)
Nutrition Follow-up  DOCUMENTATION CODES:   Obesity unspecified  INTERVENTION:   Continue Vital High Protein @ 50 ml/hr Continue 30 ml Prostat BID  Provides: 1400 kcal, 135 grams protein, and 1003 ml H2O.   NUTRITION DIAGNOSIS:   Inadequate oral intake related to inability to eat as evidenced by NPO status. Ongoing.   GOAL:   Provide needs based on ASPEN/SCCM guidelines Met.   MONITOR:   Diet advancement, Vent status, Labs, I & O's  REASON FOR ASSESSMENT:   Consult Enteral/tube feeding initiation and management  ASSESSMENT:   60 years old female was admitted on 08/24/15 with cardiac arrest ROSC estimated at 10 minutes. Emergent left heart cath with no disease found and hypothermia protocol. Suspect medication caused prolonged Qtc.  Concern for pulmonary edema so no extubation today. Lasix started.  Patient is currently intubated on ventilator support MV: 7.2 L/min Temp (24hrs), Avg:99.8 F (37.7 C), Min:97.9 F (36.6 C), Max:100.8 F (38.2 C)   Diet Order:    NPO  Skin:  Reviewed, no issues  Last BM:  unknown  Height:   Ht Readings from Last 1 Encounters:  08/24/15 '5\' 7"'  (1.702 m)    Weight:   Wt Readings from Last 1 Encounters:  08/27/15 229 lb 0.9 oz (103.9 kg)    Ideal Body Weight:  61.36 kg  BMI:  Body mass index is 35.87 kg/(m^2).  Estimated Nutritional Needs:   Kcal:  3716-9678 (11-14 kcal/kg bw)  Protein:  >123 g Pro (2 g/kg ibw)  Fluid:  Per MD  EDUCATION NEEDS:   No education needs identified at this time  [place], [place], [place] Pager [phone] After Hours Pager

## 2015-08-28 ENCOUNTER — Inpatient Hospital Stay (HOSPITAL_COMMUNITY): Payer: BLUE CROSS/BLUE SHIELD

## 2015-08-28 DIAGNOSIS — I422 Other hypertrophic cardiomyopathy: Secondary | ICD-10-CM

## 2015-08-28 LAB — GLUCOSE, CAPILLARY
GLUCOSE-CAPILLARY: 142 mg/dL — AB (ref 65–99)
Glucose-Capillary: 99 mg/dL (ref 65–99)
Glucose-Capillary: 136 mg/dL — ABNORMAL HIGH (ref 65–99)
Glucose-Capillary: 94 mg/dL (ref 65–99)
Glucose-Capillary: 116 mg/dL — ABNORMAL HIGH (ref 65–99)

## 2015-08-28 LAB — POCT I-STAT 3, ART BLOOD GAS (G3+)
ACID-BASE EXCESS: 3 mmol/L — AB (ref 0.0–2.0)
Bicarbonate: 28.3 mEq/L — ABNORMAL HIGH (ref 20.0–24.0)
O2 Saturation: 96 %
PH ART: 7.402 (ref 7.350–7.450)
PO2 ART: 85 mmHg (ref 80.0–100.0)
TCO2: 30 mmol/L (ref 0–100)
pCO2 arterial: 45.7 mmHg — ABNORMAL HIGH (ref 35.0–45.0)

## 2015-08-28 LAB — MAGNESIUM: Magnesium: 1.8 mg/dL (ref 1.7–2.4)

## 2015-08-28 LAB — PHOSPHORUS: Phosphorus: 2.3 mg/dL — ABNORMAL LOW (ref 2.5–4.6)

## 2015-08-28 MED ORDER — DEXMEDETOMIDINE HCL IN NACL 200 MCG/50ML IV SOLN
0.0000 ug/kg/h | INTRAVENOUS | Status: DC
  Administered 2015-08-28 (×3): 0.4 ug/kg/h via INTRAVENOUS
  Filled 2015-08-28 (×3): qty 50

## 2015-08-28 MED ORDER — MIDAZOLAM HCL 2 MG/2ML IJ SOLN
2.0000 mg | INTRAMUSCULAR | Status: DC | PRN
  Administered 2015-08-28 (×2): 2 mg via INTRAVENOUS
  Filled 2015-08-28: qty 2

## 2015-08-28 MED ORDER — RACEPINEPHRINE HCL 2.25 % IN NEBU
0.5000 mL | INHALATION_SOLUTION | Freq: Once | RESPIRATORY_TRACT | Status: AC
  Administered 2015-08-28: 0.5 mL via RESPIRATORY_TRACT

## 2015-08-28 MED ORDER — CHLORHEXIDINE GLUCONATE 0.12 % MT SOLN: 15.0000 mL | Freq: Two times a day (BID) | OROMUCOSAL | Status: DC

## 2015-08-28 MED ORDER — IBUPROFEN 100 MG PO CHEW
200.0000 mg | CHEWABLE_TABLET | Freq: Three times a day (TID) | ORAL | Status: DC | PRN
  Filled 2015-08-28: qty 2

## 2015-08-28 MED ORDER — IBUPROFEN 100 MG/5ML PO SUSP
200.0000 mg | Freq: Three times a day (TID) | ORAL | Status: DC | PRN
Start: 2015-08-28 — End: 2015-08-30
  Administered 2015-08-28: 200 mg via ORAL
  Filled 2015-08-28 (×2): qty 10

## 2015-08-28 MED ORDER — POTASSIUM PHOSPHATES 15 MMOLE/5ML IV SOLN
10.0000 mmol | Freq: Once | INTRAVENOUS | Status: AC
  Administered 2015-08-28: 10 mmol via INTRAVENOUS
  Filled 2015-08-28: qty 3.33

## 2015-08-28 MED ORDER — METHYLPREDNISOLONE SODIUM SUCC 125 MG IJ SOLR
INTRAMUSCULAR | Status: AC
  Filled 2015-08-28: qty 2

## 2015-08-28 MED ORDER — DEXAMETHASONE SODIUM PHOSPHATE 4 MG/ML IJ SOLN
4.0000 mg | INTRAMUSCULAR | Status: DC
  Administered 2015-08-28 – 2015-08-29 (×6): 4 mg via INTRAVENOUS
  Filled 2015-08-28 (×7): qty 1

## 2015-08-28 MED ORDER — MIDAZOLAM HCL 2 MG/2ML IJ SOLN
INTRAMUSCULAR | Status: AC
  Filled 2015-08-28: qty 2

## 2015-08-28 MED ORDER — MAGNESIUM SULFATE 2 GM/50ML IV SOLN
2.0000 g | Freq: Once | INTRAVENOUS | Status: AC
  Administered 2015-08-28: 2 g via INTRAVENOUS
  Filled 2015-08-28: qty 50

## 2015-08-28 MED ORDER — DEXMEDETOMIDINE HCL IN NACL 400 MCG/100ML IV SOLN
0.1000 ug/kg/h | INTRAVENOUS | Status: DC
  Administered 2015-08-28: 0.6 ug/kg/h via INTRAVENOUS
  Administered 2015-08-28: 0.4 ug/kg/h via INTRAVENOUS
  Administered 2015-08-29: 0.8 ug/kg/h via INTRAVENOUS
  Administered 2015-08-29: 0.5 ug/kg/h via INTRAVENOUS
  Filled 2015-08-28 (×4): qty 100

## 2015-08-28 MED ORDER — MIDAZOLAM HCL 2 MG/2ML IJ SOLN
2.0000 mg | INTRAMUSCULAR | Status: DC | PRN
  Administered 2015-08-28: 2 mg via INTRAVENOUS
  Filled 2015-08-28: qty 2

## 2015-08-28 MED ORDER — CETYLPYRIDINIUM CHLORIDE 0.05 % MT LIQD
7.0000 mL | Freq: Two times a day (BID) | OROMUCOSAL | Status: DC
  Administered 2015-08-28 – 2015-09-03 (×10): 7 mL via OROMUCOSAL

## 2015-08-28 MED ORDER — MIDAZOLAM HCL 2 MG/2ML IJ SOLN: 2.0000 mg | INTRAMUSCULAR | Status: DC | PRN

## 2015-08-28 MED ORDER — FENTANYL CITRATE (PF) 100 MCG/2ML IJ SOLN
100.0000 ug | INTRAMUSCULAR | Status: DC | PRN
Start: 2015-08-28 — End: 2015-08-28

## 2015-08-28 MED ORDER — RACEPINEPHRINE HCL 2.25 % IN NEBU
INHALATION_SOLUTION | RESPIRATORY_TRACT | Status: AC
  Filled 2015-08-28: qty 0.5

## 2015-08-28 MED ORDER — DEXMEDETOMIDINE HCL IN NACL 200 MCG/50ML IV SOLN
0.4000 ug/kg/h | INTRAVENOUS | Status: DC
  Filled 2015-08-28: qty 50

## 2015-08-28 NOTE — Clinical Documentation Improvement (Signed)
Critical Care  Based on the clinical findings below, please document any associated diagnoses/conditions the patient has or may have.   Acute Pulmonary Edema  Chronic Pulmonary Edema  Acute on Chronic Pulmonary Edema  Other  Clinically Undetermined   Supporting Information: Acute respiratory failure due to cardiac arrest, pulmonary edema per 02/17 progress notes. Patient looks volume overloaded per 02/20 progress notes.   Please exercise your independent, professional judgment when responding. A specific answer is not anticipated or expected.   Thank You, [place] [phone]

## 2015-08-28 NOTE — Progress Notes (Signed)
eLink Physician-Brief Progress Note Patient Name: Marie Ramos DOB: 05-Jun-1956 MRN: [id number]   Date of Service  08/28/2015  HPI/Events of Note  Patient c/o headache pain.   eICU Interventions  Will order Motrin chewable tablet 200 mg PO now and Q 8 hours PRN.     Intervention Category Intermediate Interventions: Pain - evaluation and management  Lalla Laham Cornelia Copa 08/28/2015, 9:51 PM

## 2015-08-28 NOTE — Progress Notes (Signed)
Wasted 45 ml of precedex at this time with Doretha Sou, RN

## 2015-08-28 NOTE — Progress Notes (Signed)
Pt complaining of CP at this time, informed NP, she asked for a EKG. BPs also increased 190s/120s. Cycling of BP at this time with hydralazine by bedside.

## 2015-08-28 NOTE — Progress Notes (Signed)
[place] Progress Note Patient Name: Marie Ramos DOB: 10/21/1955 MRN: [id number]   Date of Service  08/28/2015  HPI/Events of Note  Pt agitated.  Not improved with prn versed/fentanyl.  Uncertain if she is candidate for extubation soon.   eICU Interventions  Will add precedex with prn versed/fentanyl with RASS goal -1.    Bedside MD to assess whether she is candidate for extubation in AM.      Intervention Category Major Interventions: Other:  Lochlann Mastrangelo 08/28/2015, 3:41 AM

## 2015-08-28 NOTE — Progress Notes (Signed)
She is now on bipap and feeling better from striodr stand poing. S/p racemic epi but < 35min after racemic epi had CP and high bp and EKG showed nil acute but QTc 632msec (was already s/p mag)  Plan RN to inform EP services Repeat ekg 3pm - RN to call box MD with result  Dr. Brand Males, M.D., [facility].C.P Pulmonary and Critical Care Medicine Staff Physician [place] [facility] Pager: [phone], If no answer or between  15:00h - 7:00h: call [phone]  08/28/2015 11:34 AM

## 2015-08-28 NOTE — Progress Notes (Signed)
Spoke with Dr Caryl Comes on phone, MD advised RN to not worry about pts CP at this time due to her heart cath being clean. RN will continue to monitor pt.

## 2015-08-28 NOTE — Progress Notes (Signed)
Mild stridor post exubation  Plan Decadron Racemic epi  Dr. Brand Males, M.D., [facility].C.P Pulmonary and Critical Care Medicine Staff Physician [place] [facility] Pager: [phone], If no answer or between  15:00h - 7:00h: call [phone]  08/28/2015 10:32 AM

## 2015-08-28 NOTE — Progress Notes (Signed)
PULMONARY / CRITICAL CARE MEDICINE   Name: Marie Ramos MRN: [id number] DOB: 1955-10-20    ADMISSION DATE:  08/24/2015 CONSULTATION DATE: 08/24/15  REFERRING MD:    CHIEF COMPLAINT:  Cardiac arrest   STUDIES:  CT scan 2/17 > no acute abnormality Echo 2/17 > LVEF 40-45%, severe asymmetric septal hypertrophy without dynamic obstruction, suggestive of hypertrophic nonobstructive cardiomyopathy, diffuse hypokinesis, grade 1 diastolic dysfunction EEG [id number] > QTc 530 EKG 2/17 > QTc 645   CULTURES: Sputum 2/17 >>   ANTIBIOTICS: None  SIGNIFICANT EVENTS: 2/17  Admit with Cardiac arrest, LHC with no disease.  Arrest thought related to PSY medications with prolonged QTc  LINES/TUBES: Rt I/J 2/27 >> ET tube 2/27 >>  EVENTS 2/19 RN reports pt awake, following commands, BP elevated to [id number] systolic.  Treated with 5mg  IV Lopressor   08/27/15 - normal wua. 40% fio2 -> pulse ox 92%. Just starting SBT. EKG 493 msec on QTc   SUBJECTIVE/OVERNIGHT/INTERVAL HX 08/28/15 - diuresed 4L . Doing SBT. Meets extubation criteira. ABG on SBT oka. A bit sleepy despite being off precedex x 55min. Feels she is ready for extubation. Ongoing diuresis +  VITAL SIGNS: BP 165/78 mmHg  Pulse 73  Temp(Src) 99 F (37.2 C) (Core (Comment))  Resp 15  Ht 5\' 7"  (1.702 m)  Wt 100.4 kg (221 lb 5.5 oz)  BMI 34.66 kg/m2  SpO2 99%  HEMODYNAMICS: CVP:  [7 mmHg-10 mmHg] 7 mmHg  VENTILATOR SETTINGS: Vent Mode:  [-] PSV;CPAP FiO2 (%):  [40 %] 40 % Set Rate:  [24 bmp] 24 bmp Vt Set:  [500 mL] 500 mL PEEP:  [5 cmH20] 5 cmH20 Pressure Support:  [5 cmH20-10 cmH20] 5 cmH20 Plateau Pressure:  [19 cmH20-22 cmH20] 20 cmH20  INTAKE / OUTPUT: I/O last 3 completed shifts: In: 2676.2 [P.O.:1; I.V.:941.7; NG/GT:1733.5] Out: 7095 [Urine:7095]  PHYSICAL EXAMINATION: General:  Obese female, on vent in NAD Neuro: RASS 0 to -1, Moves all 4s, CAM-ICU neg for delirium HEENT:  Normocephalic, atraumatic. Non  icteric.  Cardiovascular:  S1,S2 rrr, rrr, no mumur, gallop or rub noted. Lungs: Clear bilaterally, with good chest expansion Abdomen: soft, large, with good bowel sounds, cooling pad in place Musculoskeletal:  No acute deformities  Skin:  No rashes or lesions  LABS: PULMONARY  Recent Labs Lab 08/26/15 1536 08/26/15 2016 08/27/15 0345 08/27/15 0941 08/28/15 0905  PHART 7.170* 7.297* 7.365 7.245* 7.402  PCO2ART 50.1* 37.4 33.0* 47.6* 45.7*  PO2ART 66.0* 96.0 121* 79.0* 85.0  HCO3 18.3* 18.2* 18.4* 20.4 28.3*  TCO2 20 19 19.4 22 30   O2SAT 87.0 96.0 98.4 92.0 96.0    CBC  Recent Labs Lab 08/25/15 0410  08/25/15 1644 08/26/15 0400 08/27/15 0344  HGB 11.8*  < > 12.2 11.6* 10.4*  HCT 36.9  < > 36.0 37.6 33.8*  WBC 11.3*  --   --  11.2* 10.5  PLT 492*  --   --  496* 436*  < > = values in this interval not displayed.  COAGULATION  Recent Labs Lab 08/24/15 1650 08/24/15 2140  INR 1.27 1.13    CARDIAC    Recent Labs Lab 08/24/15 1650 08/25/15 0050 08/25/15 0410 08/25/15 1130 08/25/15 1645  TROPONINI 1.16* 0.86* 0.65* 0.36* 0.24*   No results for input(s): PROBNP in the last 168 hours.   CHEMISTRY  Recent Labs Lab 08/24/15 1838  08/25/15 0410  08/25/15 2000 08/25/15 2353 08/26/15 0400 08/26/15 0801 08/27/15 0344 08/27/15 0856 08/28/15 0445  NA  --   < >  146*  < > 146* 146* 145 141 145  --   --   K  --   < > 3.3*  < > 3.5 3.6 3.9 4.2 3.8  --   --   CL  --   < > 115*  < > 117* 118* 117* 114* 120*  --   --   CO2  --   --  21*  --  20* 19* 21* 19* 20*  --   --   GLUCOSE  --   < > 104*  < > 92 90 93 107* 107*  --   --   BUN  --   < > 19  < > 18 17 17 16  29*  --   --   CREATININE  --   < > 0.68  < > 0.67 0.65 0.78 0.85 1.14*  --   --   CALCIUM  --   --  7.7*  --  7.6* 7.6* 7.8* 7.7* 8.5*  --   --   MG 2.2  --  1.9  --   --   --   --   --   --  1.9 1.8  PHOS 4.1  --  2.5  --   --   --   --   --   --  2.2* 2.3*  < > = values in this interval not  displayed. Estimated Creatinine Clearance: 63.9 mL/min (by C-G formula based on Cr of 1.14).   LIVER  Recent Labs Lab 08/24/15 1300 08/24/15 1650 08/24/15 2140  AST 94*  --   --   ALT 73*  --   --   ALKPHOS 123  --   --   BILITOT 0.4  --   --   PROT 6.8  --   --   ALBUMIN 3.5  --   --   INR  --  1.27 1.13     INFECTIOUS  Recent Labs Lab 08/24/15 1650 08/25/15 0410 08/26/15 0400  PROCALCITON 0.21 0.58 0.54     ENDOCRINE CBG (last 3)   Recent Labs  08/27/15 2345 08/28/15 0344 08/28/15 0809  GLUCAP 106* 99 136*         IMAGING x48h  - image(s) personally visualized  -   highlighted in bold Dg Chest Port 1 View  08/28/2015  CLINICAL DATA:  Endotracheal tube position EXAM: PORTABLE CHEST 1 VIEW COMPARISON:  08/27/2015 FINDINGS: Endotracheal tube is in good position approximately 3 cm above the carina. NG tube in place with the tip not visualized. Right jugular catheter tip at the cavoatrial junction. No pneumothorax Improvement in bibasilar airspace disease compared with the prior study. There remains left greater than right bibasilar airspace disease. No significant pleural effusion. IMPRESSION: Endotracheal tube now in good position Improved aeration with decrease in bibasilar airspace disease compared with yesterday Electronically Signed   By: Franchot Gallo M.D.   On: 08/28/2015 07:12   Dg Chest Port 1 View  08/27/2015  CLINICAL DATA:  Acute respiratory failure. EXAM: PORTABLE CHEST 1 VIEW COMPARISON:  08/26/2015. FINDINGS: Endotracheal tube, NG tube, right IJ line stable position. Cardiomegaly with slight increase in pulmonary venous congestion and bibasilar infiltrates. Bilateral pleural effusions. Findings consistent congestive heart failure. Findings have worsened from prior exam . IMPRESSION: 1. Lines and tubes in stable position. 2. Cardiomegaly with increase pulmonary venous congestion and bibasilar infiltrates. Bilateral small pleural effusions noted.  Findings consistent with worsening congestive heart failure. Electronically Signed   By: Marcello Moores  Register   On: 08/27/2015 07:04        DISCUSSION: 60 years old female was admitted on 08/24/15 with cardiac arrest ROSC estimated at 10 minutes. Emergent left heart cath with no disease found and hypothermia protocol.  Prolonged Qtc.  ASSESSMENT / PLAN:  PULMONARY A: Acute Respiratory failure due to cardiac arrest Pulmonary edema - improving    - meets extubatin criteria P:   Extubate -> might need bipap   CARDIOVASCULAR A:  Cardiopulmonary arrest - shockable rhythm with AED x2, 10 min CPR.  LHC without CAD Concern for Nonobstructive Hypertrophic Cardiomyopathy on ECHO Bradycardia  Prolonged QTc - in setting of psy medications Hypotension - post arrest + sedation   - normal hemodynamic. EKG QTc normal. Diuresing  P:  Cotniue lasix - cards to adjust if concern due to HOCM but looks overloadded on CXR Goal MAP > 65 Aspirin daily Defer Heparin and other antiplatelets to Cardiology. Likely will need cardiac MRI in future for further evaluation Lopressor 25mg  BID + Q3 PRN for SBP >160 Avoid QT prolonging medications    RENAL A:   Mild AKI - resolved.    - Mild low  hypomag and  hypophos  P:   replete mag andphos Avoid hypotension, ensure adequate hydration Trend BMP / UOP  Replace electrolytes as indicated    GASTROINTESTINAL A:   Obesity  P:   PPI for stress ulcer prophylaxis Initiate TF 2/19  HEMATOLOGIC A:   Leukocytosis - ? Stress response P:  Trend CBC Transfuse per icu protocol  INFECTIOUS A:   No evidence of acute infectious process P:   Follow cultures as above Trend PCT   ENDOCRINE A:   Mild Hyperglycemia  P:   Monitor BS q4 hours SSI coverage  NEUROLOGIC A:   Acute encephalopathy high risk for Anoxic brain injury due to cardiac arrest Bipolar Depression   - normal mental status on WUA  P:   Dc precedex gtt RASS goal: 0 to  -1 Daily WUA / SBT  PRN Sedation Will need PSY input on alternative medication regimen once ready to restart Await EEG results    FAMILY  - Updates:  Family updated at bedside 2/19.     None at bedside 08/27/15. Son updated 08/28/2015   - Inter-disciplinary family meet or Palliative Care meeting due by:  08/31/15     GLOBAL Extubate 08/28/2015. If needed do bipap. She is at risk fro reintubation. Conitnue lasix. Replete mag and phos. Monitor bmet     The patient is critically ill with multiple organ systems failure and requires high complexity decision making for assessment and support, frequent evaluation and titration of therapies, application of advanced monitoring technologies and extensive interpretation of multiple databases.   Critical Care Time devoted to patient care services described in this note is  30  Minutes. This time reflects time of care of this signee Dr Brand Males. This critical care time does not reflect procedure time, or teaching time or supervisory time of PA/NP/Med student/Med Resident etc but could involve care discussion time    Dr. Brand Males, M.D., [facility].C.P Pulmonary and Critical Care Medicine Staff Physician [facility] Pager: [phone], If no answer or between  15:00h - 7:00h: call [phone]  08/28/2015 9:22 AM

## 2015-08-28 NOTE — Procedures (Signed)
Extubation Procedure Note  Patient Details:   Name: Marie Ramos DOB: 04-30-1956 MRN: [id number]   Airway Documentation:     Evaluation  O2 sats: stable throughout Complications: No apparent complications Patient did tolerate procedure well. Bilateral Breath Sounds: Clear, Diminished Suctioning: Oral, Airway Yes  4l/min [place] placed Incentive spirometer instructed 777ml achieved  [name] 08/28/2015, 9:47 AM

## 2015-08-28 NOTE — Progress Notes (Signed)
Pt developed some stridor at this time, pt was coughing, felt like something was stuck in her throat, and had increased SOB. RT placed pt on bipap at this time, racemic epip neb given, decadron ordered. RN and RT will continue to monitor pt.

## 2015-08-28 NOTE — Progress Notes (Signed)
RT note- Bipap was briefly placed due to stridor for about 1 hour.

## 2015-08-28 NOTE — Progress Notes (Signed)
SUBJECTIVE: Remains intubated, is awake and following commands.  Remains intubated.    Marland Kitchen antiseptic oral rinse  7 mL Mouth Rinse QID  . chlorhexidine gluconate  15 mL Mouth Rinse BID  . feeding supplement (PRO-STAT SUGAR FREE 64)  30 mL Per Tube BID  . furosemide  40 mg Intravenous 3 times per day  . heparin  5,000 Units Subcutaneous 3 times per day  . insulin aspart  2-6 Units Subcutaneous 6 times per day  . metoprolol tartrate  25 mg Per Tube BID  . pantoprazole sodium  40 mg Per Tube Daily  . [START ON 08/30/2015] pneumococcal 23 valent vaccine  0.5 mL Intramuscular Tomorrow-1000  . sodium chloride flush  10-40 mL Intracatheter Q12H  . sodium chloride flush  3 mL Intravenous Q12H   . sodium chloride 10 mL/hr at 08/27/15 2000  . dexmedetomidine 0.4 mcg/kg/hr (08/28/15 0503)  . feeding supplement (VITAL HIGH PROTEIN) 1,000 mL (08/27/15 2000)    OBJECTIVE: Physical Exam: Filed Vitals:   08/28/15 0400 08/28/15 0500 08/28/15 0600 08/28/15 0700  BP: 181/91 109/61 118/62 163/70  Pulse: 65 53 56 53  Temp: 99 F (37.2 C) 98.8 F (37.1 C) 98.8 F (37.1 C) 99 F (37.2 C)  TempSrc: Core (Comment)     Resp: 15 24 24 24   Height:      Weight:      SpO2: 98% 99% 98% 97%    Intake/Output Summary (Last 24 hours) at 08/28/15 0746 Last data filed at 08/28/15 0741  Gross per 24 hour  Intake 1797.67 ml  Output   6700 ml  Net -4902.33 ml    Telemetry reveals sinus rhythm, no arrhythmias  GEN- The patient is obese, awake and following commands, intubated Head- normocephalic, atraumatic Eyes-  Sclera clear, conjunctiva pink Ears- hearing intact Oropharynx- ETT Neck- supple,  Lungs- Clear to ausculation bilaterally, normal work of  breathing Heart- Regular rate and rhythm  GI- soft, NT, ND Extremities- no clubbing, cyanosis, + dependant edema Skin- no rash or lesion Psych- intubated, following commands Neuro-intubated, following commnds appropriately  LABS: Basic  Metabolic Panel:  Recent Labs  08/26/15 0801 08/27/15 0344 08/27/15 0856 08/28/15 0445  NA 141 145  --   --   K 4.2 3.8  --   --   CL 114* 120*  --   --   CO2 19* 20*  --   --   GLUCOSE 107* 107*  --   --   BUN 16 29*  --   --   CREATININE 0.85 1.14*  --   --   CALCIUM 7.7* 8.5*  --   --   MG  --   --  1.9 1.8  PHOS  --   --  2.2* 2.3*   Liver Function Tests: No results for input(s): AST, ALT, ALKPHOS, BILITOT, PROT, ALBUMIN in the last 72 hours. No results for input(s): LIPASE, AMYLASE in the last 72 hours. CBC:  Recent Labs  08/26/15 0400 08/27/15 0344  WBC 11.2* 10.5  HGB 11.6* 10.4*  HCT 37.6 33.8*  MCV 92.6 92.9  PLT 496* 436*   Cardiac Enzymes:  Recent Labs  08/25/15 1130 08/25/15 1645  TROPONINI 0.36* 0.24*   RADIOLOGY: Dg Chest Port 1 View 08/27/2015  CLINICAL DATA:  Acute respiratory failure. EXAM: PORTABLE CHEST 1 VIEW COMPARISON:  08/26/2015. FINDINGS: Endotracheal tube, NG tube, right IJ line stable position. Cardiomegaly with slight increase in pulmonary venous congestion and bibasilar infiltrates. Bilateral pleural effusions. Findings  consistent congestive heart failure. Findings have worsened from prior exam . IMPRESSION: 1. Lines and tubes in stable position. 2. Cardiomegaly with increase pulmonary venous congestion and bibasilar infiltrates. Bilateral small pleural effusions noted. Findings consistent with worsening congestive heart failure. Electronically Signed   By: Cool   On: 08/27/2015 07:04      ASSESSMENT AND PLAN:  Active Problems:   Cardiac arrest ([place])   Acute respiratory failure ([place]) 1. VF/PEA arrest The patient had an OOH resuscitated cardiac arrest presumed to be VF with AED advising shocks. She received immediate bystander CPR. Estimated downtime 10 minutes. Cath Friday with no CAD.  Echo suggestive of possible nonobstructive hypertrophic CM.    S/p hypothermia protocol She remains intubated this AM, is awake and  following commands, extubate timing as per CCM.  Dr. Rayann Heman had a long discussion with the family over the weekend.  There is no FH of sudden death to their knowledge.   QT prolonged on admission in the setting of psychiatric drugs before initiation of cooling. Suspected that this was the cause of her arrest. No further arrhythmias noted. Will add metoprolol via tube today.   Keep K >3.9, Mg >1.8 Will need to re-evaluate QT once patient is rewarmed and psychotropic medicines have washed out. Avoid QT prolonging medications Cardiac MRI to evaluate for HCM once clinically improved and extubated (several days)  2. Elevated troponin Due to demand ischemia, cath with no CAD  3. Psych Will need reassessment once off psychotropic medicines Given prolonged qt, this presents a problem. We may have to consult psychiatry for non qt prolonging options once she is recovered/ awake     Virl Axe, PA-C 08/28/2015 7:46 AM   Will await extubation  [place] with need for MRI I am not sure why K low  following arrest, we see this but would expect to be high with H/K shifting with acidemia   I was called following extubation and her complaint of chest pain. QTc was read as 600+ milliseconds. I have reviewed the QT interval I think is about 400 ms not 544.

## 2015-08-28 NOTE — Progress Notes (Signed)
Started pt on precedex gtt per MD. Pt tolerated it well, resting at this time.

## 2015-08-29 ENCOUNTER — Encounter (HOSPITAL_COMMUNITY): Payer: Self-pay | Admitting: Internal Medicine

## 2015-08-29 DIAGNOSIS — I421 Obstructive hypertrophic cardiomyopathy: Secondary | ICD-10-CM | POA: Diagnosis present

## 2015-08-29 DIAGNOSIS — E876 Hypokalemia: Secondary | ICD-10-CM | POA: Diagnosis present

## 2015-08-29 DIAGNOSIS — F319 Bipolar disorder, unspecified: Secondary | ICD-10-CM

## 2015-08-29 DIAGNOSIS — R9431 Abnormal electrocardiogram [ECG] [EKG]: Secondary | ICD-10-CM

## 2015-08-29 HISTORY — DX: Obstructive hypertrophic cardiomyopathy: I42.1

## 2015-08-29 HISTORY — DX: Abnormal electrocardiogram (ECG) (EKG): R94.31

## 2015-08-29 HISTORY — DX: Hypokalemia: E87.6

## 2015-08-29 LAB — BASIC METABOLIC PANEL
ANION GAP: 18 — AB (ref 5–15)
BUN: 39 mg/dL — ABNORMAL HIGH (ref 6–20)
CO2: 27 mmol/L (ref 22–32)
Calcium: 9.2 mg/dL (ref 8.9–10.3)
Chloride: 100 mmol/L — ABNORMAL LOW (ref 101–111)
Creatinine, Ser: 1.21 mg/dL — ABNORMAL HIGH (ref 0.44–1.00)
GFR calc Af Amer: 55 mL/min — ABNORMAL LOW (ref >60)
GFR, EST NON AFRICAN AMERICAN: 48 mL/min — AB (ref >60)
GLUCOSE: 134 mg/dL — AB (ref 65–99)
POTASSIUM: 3.2 mmol/L — AB (ref 3.5–5.1)
Sodium: 145 mmol/L (ref 135–145)

## 2015-08-29 LAB — CBC WITH DIFFERENTIAL/PLATELET
BASOS ABS: 0 10*3/uL (ref 0.0–0.1)
Basophils Relative: 0 %
Eosinophils Absolute: 0 10*3/uL (ref 0.0–0.7)
Eosinophils Relative: 0 %
HEMATOCRIT: 38.4 % (ref 36.0–46.0)
Hemoglobin: 12.3 g/dL (ref 12.0–15.0)
LYMPHS PCT: 8 %
Lymphs Abs: 1.3 10*3/uL (ref 0.7–4.0)
MCH: 29 pg (ref 26.0–34.0)
MCHC: 32 g/dL (ref 30.0–36.0)
MCV: 90.6 fL (ref 78.0–100.0)
Monocytes Absolute: 0.3 10*3/uL (ref 0.1–1.0)
Monocytes Relative: 2 %
NEUTROS ABS: 15 10*3/uL — AB (ref 1.7–7.7)
Neutrophils Relative %: 90 %
PLATELETS: 473 10*3/uL — AB (ref 150–400)
RBC: 4.24 MIL/uL (ref 3.87–5.11)
RDW: 15.8 % — ABNORMAL HIGH (ref 11.5–15.5)
WBC: 16.7 10*3/uL — AB (ref 4.0–10.5)

## 2015-08-29 LAB — GLUCOSE, CAPILLARY
GLUCOSE-CAPILLARY: 119 mg/dL — AB (ref 65–99)
GLUCOSE-CAPILLARY: 148 mg/dL — AB (ref 65–99)
Glucose-Capillary: 109 mg/dL — ABNORMAL HIGH (ref 65–99)
Glucose-Capillary: 116 mg/dL — ABNORMAL HIGH (ref 65–99)
Glucose-Capillary: 134 mg/dL — ABNORMAL HIGH (ref 65–99)
Glucose-Capillary: 137 mg/dL — ABNORMAL HIGH (ref 65–99)

## 2015-08-29 LAB — PHOSPHORUS: PHOSPHORUS: 4.7 mg/dL — AB (ref 2.5–4.6)

## 2015-08-29 LAB — MAGNESIUM: Magnesium: 2.1 mg/dL (ref 1.7–2.4)

## 2015-08-29 MED ORDER — DEXAMETHASONE SODIUM PHOSPHATE 4 MG/ML IJ SOLN
2.0000 mg | INTRAMUSCULAR | Status: DC
  Administered 2015-08-29 – 2015-08-30 (×5): 2 mg via INTRAVENOUS
  Filled 2015-08-29 (×4): qty 1

## 2015-08-29 MED ORDER — FUROSEMIDE 10 MG/ML IJ SOLN: 40.0000 mg | Freq: Every day | INTRAMUSCULAR | Status: DC

## 2015-08-29 MED ORDER — PANTOPRAZOLE SODIUM 40 MG PO TBEC
40.0000 mg | DELAYED_RELEASE_TABLET | Freq: Every day | ORAL | Status: DC
  Administered 2015-08-29 – 2015-09-04 (×7): 40 mg via ORAL
  Filled 2015-08-29 (×7): qty 1

## 2015-08-29 MED ORDER — OXCARBAZEPINE 300 MG PO TABS
300.0000 mg | ORAL_TABLET | Freq: Two times a day (BID) | ORAL | Status: DC
  Administered 2015-08-29 – 2015-09-05 (×14): 300 mg via ORAL
  Filled 2015-08-29 (×17): qty 1

## 2015-08-29 MED ORDER — ALPRAZOLAM 0.25 MG PO TABS
0.2500 mg | ORAL_TABLET | Freq: Every evening | ORAL | Status: DC | PRN
  Administered 2015-08-30 (×2): 0.25 mg via ORAL
  Filled 2015-08-29 (×2): qty 1

## 2015-08-29 MED ORDER — POTASSIUM CHLORIDE 10 MEQ/50ML IV SOLN
10.0000 meq | INTRAVENOUS | Status: AC
  Administered 2015-08-29 (×3): 10 meq via INTRAVENOUS
  Filled 2015-08-29 (×3): qty 50

## 2015-08-29 MED ORDER — NAPHAZOLINE-GLYCERIN 0.012-0.2 % OP SOLN
1.0000 [drp] | Freq: Four times a day (QID) | OPHTHALMIC | Status: DC | PRN
  Administered 2015-08-29: 1 [drp] via OPHTHALMIC
  Filled 2015-08-29: qty 15

## 2015-08-29 MED ORDER — METOPROLOL TARTRATE 25 MG PO TABS
25.0000 mg | ORAL_TABLET | Freq: Two times a day (BID) | ORAL | Status: DC
  Administered 2015-08-29 (×2): 25 mg via ORAL
  Filled 2015-08-29 (×2): qty 1

## 2015-08-29 NOTE — Evaluation (Signed)
Physical Therapy Evaluation Patient Details Name: Marie Ramos MRN: [id number] DOB: Jul 12, 1955 Today's Date: 08/29/2015   History of Present Illness  60 year old with history of bipolar disorder. Admitted after a witnessed cardiac arrest, intubated initially, emergently taken for heart cath and placed on hypothermic protocol. Encepholapthy noted.  Clinical Impression  Patient demonstrates deficits in functional mobility as indicated below. Will need continued skilled PT to address deficits and maximize function. Will see as indicated and progress as tolerated. At this time, patient demonstrating definitive confusion during session (did not know what a wash cloth was or what it would be used for, could not remember basic home set up information without confirming with husband, had difficulty follow cues for mobility). Patient also with physical deficits and general deconditioning from inactivity post cardiac arrest. At this time, recommending CIR consult for potential post acute rehabilitation. There is potential that patient may progress well and not need comprehensive therapies, but at this time, functional deficits in conjunction with cognitive deficits indicate that patient would benefit from comprehensive therapies to address impairments.   OF NOTE: BP elevated with modest activity, nsg aware. HR in mid 60s and O2 saturations stable on 4 liters.    Follow Up Recommendations CIR (pending progression over course of stay (may not need CIR))    Equipment Recommendations  Other (comment) (tbd)    Recommendations for Other Services Rehab consult     Precautions / Restrictions Precautions Precautions: Fall Restrictions Weight Bearing Restrictions: No      Mobility  Bed Mobility Overal bed mobility: Needs Assistance Bed Mobility: Supine to Sit     Supine to sit: Mod assist     General bed mobility comments: Verbal and tactile cues to initiate movement of LEs to EOB, increased  moderate assist to elevate trunk to upright and position trunk at EOB. Increased time to perform.   Transfers Overall transfer level: Needs assistance Equipment used: Rolling walker (2 wheeled) Transfers: Sit to/from [facility] Sit to Stand: Min assist;+2 physical assistance;+2 safety/equipment Stand pivot transfers: Min assist;+2 physical assistance;+2 safety/equipment       General transfer comment: Min assist to elevate to upright, +2 assist for line management and safety secondaryt to some modest dizziness reported. Patient required manual assist for control of RW and cues on how to use and initiate steps to chair.  Ambulation/Gait                Stairs            Wheelchair Mobility    Modified Rankin (Stroke Patients Only)       Balance Overall balance assessment: Needs assistance   Sitting balance-Leahy Scale: Fair Sitting balance - Comments: able to sit EOB ~7 minutes BP assessed and elevated 150s/100s nsg aware     Standing balance-Leahy Scale: Poor Standing balance comment: reliance on UE support, some instability noted in static standing                             Pertinent Vitals/Pain Pain Assessment: Faces Faces Pain Scale: Hurts little more Pain Location: chest pain at compression site Pain Descriptors / Indicators: Sore Pain Intervention(s): Monitored during session    Home Living Family/patient expects to be discharged to:: Private residence Living Arrangements: Spouse/significant other Available Help at Discharge: Family Type of Home: House Home Access: Level entry     Home Layout: Two level Home Equipment: None      Prior  Function Level of Independence: Independent               Hand Dominance   Dominant Hand: Right    Extremity/Trunk Assessment   Upper Extremity Assessment: Defer to OT evaluation           Lower Extremity Assessment: Overall WFL for tasks assessed          Communication   Communication: No difficulties  Cognition Arousal/Alertness: Awake/alert Behavior During Therapy: WFL for tasks assessed/performed Overall Cognitive Status: Impaired/Different from baseline Area of Impairment: Orientation;Attention;Memory;Following commands;Safety/judgement;Problem solving;Awareness Orientation Level: Disoriented to;Time Current Attention Level: Focused Memory: Decreased short-term memory Following Commands: Follows one step commands consistently;Follows one step commands with increased time   Awareness: Intellectual Problem Solving: Slow processing;Decreased initiation;Difficulty sequencing;Requires verbal cues;Requires tactile cues General Comments: Patient with noted confusion throughout session. Could not remember basic history of home enviroment and PLOF. Patient also with cnofusion during session such as asking "what is that" when picking up a wash cloth. When informed it was a wash cloth, patient could not recognize what a wash cloth would be used for until promoted with verbal cues"    General Comments      Exercises        Assessment/Plan    PT Assessment Patient needs continued PT services  PT Diagnosis Difficulty walking;Abnormality of gait;Acute pain;Altered mental status   PT Problem List Decreased activity tolerance;Decreased balance;Decreased mobility;Decreased coordination;Decreased cognition;Decreased safety awareness;Cardiopulmonary status limiting activity;Pain  PT Treatment Interventions DME instruction;Gait training;Stair training;Functional mobility training;Therapeutic activities;Therapeutic exercise;Balance training;Cognitive remediation;Patient/family education   PT Goals (Current goals can be found in the Care Plan section) Acute Rehab PT Goals Patient Stated Goal: none stated PT Goal Formulation: With patient Time For Goal Achievement: 09/12/15 Potential to Achieve Goals: Good    Frequency Min 3X/week   Barriers to  discharge        Co-evaluation               End of Session Equipment Utilized During Treatment: Gait belt;Oxygen Activity Tolerance: Patient tolerated treatment well;Patient limited by fatigue Patient left: in chair;with call bell/phone within reach Nurse Communication: Mobility status;Other (comment) (Elevated BP and continued confusion)         Time: [id number] PT Time Calculation (min) (ACUTE ONLY): 28 min   Charges:   PT Evaluation $PT Eval High Complexity: 1 Procedure PT Treatments $Therapeutic Activity: 8-22 mins   PT G CodesDuncan Dull 10-Sep-2015, 11:25 AM Alben Deeds, PT DPT  [phone]

## 2015-08-29 NOTE — Progress Notes (Signed)
Nutrition Follow-up  DOCUMENTATION CODES:   Obesity unspecified  INTERVENTION:   Encourage meals and snacks.   NUTRITION DIAGNOSIS:   Inadequate oral intake related to inability to eat as evidenced by NPO status. Resolved.   GOAL:   Patient will meet greater than or equal to 90% of their needs Met.   MONITOR:   PO intake  ASSESSMENT:   60 years old female was admitted on 08/24/15 with cardiac arrest ROSC estimated at 10 minutes. Emergent left heart cath with no disease found and hypothermia protocol. Suspect medication caused prolonged Qtc.  2/21 extubated 2/22 Heart Healthy diet, po 100%. Spoke with pt and family at bedside. Pt with good appetite. Loves the coffee.  Medications reviewed and include: KCl Labs reviewed: potassium low 3.2 CBG's: 119-137   Diet Order:     Skin:  Reviewed, no issues  Last BM:  unknown  Height:   Ht Readings from Last 1 Encounters:  08/24/15 5' 7" (1.702 m)    Weight:   Wt Readings from Last 1 Encounters:  08/29/15 216 lb 0.8 oz (98 kg)    Ideal Body Weight:  61.36 kg  BMI:  Body mass index is 33.83 kg/(m^2).  Estimated Nutritional Needs:   Kcal:  1600-1800  Protein:  100-110 grams  Fluid:  > 1.6 L/day  EDUCATION NEEDS:   No education needs identified at this time    RD, LDN, CNSC [phone] Pager [phone] After Hours Pager  

## 2015-08-29 NOTE — Progress Notes (Signed)
Rehab Admissions Coordinator Note:  Patient was screened by Cleatrice Burke for appropriateness for an Inpatient Acute Rehab Consult per PT recommendation. Noted just extubated and on precedex. At this time, we are recommending I will follow progress over the next 24 - 48 hrs to assist in determining rehab venue needs.Cleatrice Burke 08/29/2015, 12:17 PM  I can be reached at [phone].

## 2015-08-29 NOTE — Progress Notes (Signed)
eLink Physician-Brief Progress Note Patient Name: Marie Ramos DOB: 06-29-1956 MRN: [id number]   Date of Service  08/29/2015  HPI/Events of Note  Patient c/o burning eyes.   eICU Interventions  Will order Clear Eyes Drops 1-2 drops in both eyes Q 6 hours PRN burning or irritation.      Intervention Category Minor Interventions: Routine modifications to care plan (e.g. PRN medications for pain, fever)  Sommer,Steven Eugene 08/29/2015, 5:18 PM

## 2015-08-29 NOTE — Consult Note (Signed)
[facility] Consult   Reason for Consult:  Medication management for bipolar disorder and recent cardiac arrest Referring Physician:  Dr. Chase Caller Patient Identification: Marie Ramos MRN:  [id number] Principal Diagnosis: <principal problem not specified> Diagnosis:   Patient Active Problem List   Diagnosis Date Noted  . Hypertrophic obstructive cardiomyopathy ([place]) [I42.1] 08/29/2015  . QT prolongation [I45.81] 08/29/2015  . Hypokalemia [E87.6] 08/29/2015  . Acute respiratory failure ([place]) [J96.00]   . Cardiac arrest [facility]) [I46.9] 08/24/2015    Total Time spent with patient: 1 hour  Subjective:   Marie Ramos is a 60 y.o. female patient admitted with cardiac arrest.  HPI:  Marie Ramos is a 60 year old white female, seen, chart reviewed for face-to-face psychiatry consultation and evaluation. Patient husband and her son were at bedside and supportive to the patient. Patient was admitted to [facility] secondary to increased QTC prolongation and cardiac arrest. Patient and her family reported that she has been taking her medication Seroquel, Effexor and Adderall over 7 years and has been stable on the medication regimen. Reportedly her medication Adderall XR has been changed to regular Adderall 20 mg 3 times a day for concentration and awakefulness about a week ago. Patient has been diagnosed with bipolar disorder. Patient and her family and her son agrees to change her medication at this time secondary to increased QTC prolongation and cardiac arrest before admitted to hospital. Patient will be starting Trileptal 300 mg 2 times daily and discontinue Seroquel and Adderall at this time. Review of EKG indicated QTC prolongation at 645 at highest during this hospitalization.  Medical history: She was in the cafeteria and lost her consciousness. School nurse was available immediately and started CPR and placed her on AED,from which she received 2 shocks.  EMS arrived and found the Patient in PEA arrest.. One shot of epinephrine was given and the patient regained her pulse back, later patient's rhythm converted into SVT. She was shocked and was converted to NSR at 360 joules. Her total estimated time for ROSC was approximately 10 minutes  Past Psychiatric History: Bipolar disorder but no acute psychiatric hospitalization. Patient has been receiving psychiatric medication management from [facility].  Risk to Self: Is patient at risk for suicide?: No Risk to Others:   Prior Inpatient Therapy:   Prior Outpatient Therapy:    Past Medical History:  Past Medical History  Diagnosis Date  . Bipolar 1 disorder ([place])   . Hypertrophic obstructive cardiomyopathy ([place]) 08/29/2015  . Cardiac arrest ([place]) 08/24/2015  . QT prolongation 08/29/2015    Past Surgical History  Procedure Laterality Date  . Cardiac catheterization N/A 08/24/2015    Procedure: Left Heart Cath and Coronary Angiography;  Surgeon: Sherren Mocha, MD;  Location: [facility];  Service: Cardiovascular;  Laterality: N/A;   Family History: No family history on file. Family Psychiatric  History: Unknown Social History:  History  Alcohol Use: Not on file     History  Drug Use Not on file    Social History   Social History  . Marital Status: Married    Spouse Name: N/A  . Number of Children: N/A  . Years of Education: N/A   Social History Main Topics  . Smoking status: Not on file  . Smokeless tobacco: Not on file  . Alcohol Use: Not on file  . Drug Use: Not on file  . Sexual Activity: Not on file   Other Topics Concern  .  Not on file   Social History Narrative   Additional Social History:    Allergies:   Allergies  Allergen Reactions  . Naproxen Itching  . Vicodin [Hydrocodone-Acetaminophen] Itching    Labs:  Results for orders placed or performed during the hospital encounter of 08/24/15 (from the past 48 hour(s))   Glucose, capillary     Status: Abnormal   Collection Time: 08/27/15  4:12 PM  Result Value Ref Range   Glucose-Capillary 112 (H) 65 - 99 mg/dL   Comment 1 Capillary Specimen   Glucose, capillary     Status: Abnormal   Collection Time: 08/27/15  8:06 PM  Result Value Ref Range   Glucose-Capillary 111 (H) 65 - 99 mg/dL   Comment 1 Capillary Specimen   Glucose, capillary     Status: Abnormal   Collection Time: 08/27/15 11:45 PM  Result Value Ref Range   Glucose-Capillary 106 (H) 65 - 99 mg/dL   Comment 1 Capillary Specimen   Glucose, capillary     Status: None   Collection Time: 08/28/15  3:44 AM  Result Value Ref Range   Glucose-Capillary 99 65 - 99 mg/dL   Comment 1 Capillary Specimen   Magnesium     Status: None   Collection Time: 08/28/15  4:45 AM  Result Value Ref Range   Magnesium 1.8 1.7 - 2.4 mg/dL  Phosphorus     Status: Abnormal   Collection Time: 08/28/15  4:45 AM  Result Value Ref Range   Phosphorus 2.3 (L) 2.5 - 4.6 mg/dL  Glucose, capillary     Status: Abnormal   Collection Time: 08/28/15  8:09 AM  Result Value Ref Range   Glucose-Capillary 136 (H) 65 - 99 mg/dL  I-STAT 3, arterial blood gas (G3+)     Status: Abnormal   Collection Time: 08/28/15  9:05 AM  Result Value Ref Range   pH, Arterial 7.402 7.350 - 7.450   pCO2 arterial 45.7 (H) 35.0 - 45.0 mmHg   pO2, Arterial 85.0 80.0 - 100.0 mmHg   Bicarbonate 28.3 (H) 20.0 - 24.0 mEq/L   TCO2 30 0 - 100 mmol/L   O2 Saturation 96.0 %   Acid-Base Excess 3.0 (H) 0.0 - 2.0 mmol/L   Patient temperature 37.3 C    Collection site RADIAL, ALLEN'S TEST ACCEPTABLE    Sample type ARTERIAL   Glucose, capillary     Status: None   Collection Time: 08/28/15 12:07 PM  Result Value Ref Range   Glucose-Capillary 94 65 - 99 mg/dL  Glucose, capillary     Status: Abnormal   Collection Time: 08/28/15  4:26 PM  Result Value Ref Range   Glucose-Capillary 142 (H) 65 - 99 mg/dL   Comment 1 Capillary Specimen   Glucose, capillary      Status: Abnormal   Collection Time: 08/28/15  7:50 PM  Result Value Ref Range   Glucose-Capillary 116 (H) 65 - 99 mg/dL   Comment 1 Capillary Specimen   Glucose, capillary     Status: Abnormal   Collection Time: 08/28/15 11:49 PM  Result Value Ref Range   Glucose-Capillary 109 (H) 65 - 99 mg/dL  Glucose, capillary     Status: Abnormal   Collection Time: 08/29/15  4:22 AM  Result Value Ref Range   Glucose-Capillary 137 (H) 65 - 99 mg/dL   Comment 1 Capillary Specimen   Magnesium     Status: None   Collection Time: 08/29/15  5:15 AM  Result Value Ref Range  Magnesium 2.1 1.7 - 2.4 mg/dL  Phosphorus     Status: Abnormal   Collection Time: 08/29/15  5:15 AM  Result Value Ref Range   Phosphorus 4.7 (H) 2.5 - 4.6 mg/dL  CBC with Differential/Platelet     Status: Abnormal   Collection Time: 08/29/15  5:15 AM  Result Value Ref Range   WBC 16.7 (H) 4.0 - 10.5 K/uL   RBC 4.24 3.87 - 5.11 MIL/uL   Hemoglobin 12.3 12.0 - 15.0 g/dL   HCT 38.4 36.0 - 46.0 %   MCV 90.6 78.0 - 100.0 fL   MCH 29.0 26.0 - 34.0 pg   MCHC 32.0 30.0 - 36.0 g/dL   RDW 15.8 (H) 11.5 - 15.5 %   Platelets 473 (H) 150 - 400 K/uL   Neutrophils Relative % 90 %   Neutro Abs 15.0 (H) 1.7 - 7.7 K/uL   Lymphocytes Relative 8 %   Lymphs Abs 1.3 0.7 - 4.0 K/uL   Monocytes Relative 2 %   Monocytes Absolute 0.3 0.1 - 1.0 K/uL   Eosinophils Relative 0 %   Eosinophils Absolute 0.0 0.0 - 0.7 K/uL   Basophils Relative 0 %   Basophils Absolute 0.0 0.0 - 0.1 K/uL  Basic metabolic panel     Status: Abnormal   Collection Time: 08/29/15  5:15 AM  Result Value Ref Range   Sodium 145 135 - 145 mmol/L   Potassium 3.2 (L) 3.5 - 5.1 mmol/L   Chloride 100 (L) 101 - 111 mmol/L   CO2 27 22 - 32 mmol/L   Glucose, Bld 134 (H) 65 - 99 mg/dL   BUN 39 (H) 6 - 20 mg/dL   Creatinine, Ser 1.21 (H) 0.44 - 1.00 mg/dL   Calcium 9.2 8.9 - 10.3 mg/dL   GFR calc non Af Amer 48 (L) >60 mL/min   GFR calc Af Amer 55 (L) >60 mL/min     Comment: (NOTE) The eGFR has been calculated using the CKD EPI equation. This calculation has not been validated in all clinical situations. eGFR's persistently <60 mL/min signify possible Chronic Kidney Disease.    Anion gap 18 (H) 5 - 15  Glucose, capillary     Status: Abnormal   Collection Time: 08/29/15  7:43 AM  Result Value Ref Range   Glucose-Capillary 119 (H) 65 - 99 mg/dL   Comment 1 Capillary Specimen   Glucose, capillary     Status: Abnormal   Collection Time: 08/29/15 12:06 PM  Result Value Ref Range   Glucose-Capillary 148 (H) 65 - 99 mg/dL   Comment 1 Capillary Specimen     Current Facility-Administered Medications  Medication Dose Route Frequency Provider Last Rate Last Dose  . 0.9 %  sodium chloride infusion  250 mL Intravenous PRN Sherren Mocha, MD 10 mL/hr at 08/28/15 1900 250 mL at 08/28/15 1900  . ALPRAZolam (XANAX) tablet 0.25 mg  0.25 mg Oral QHS PRN Brand Males, MD      . antiseptic oral rinse (CPC / CETYLPYRIDINIUM CHLORIDE 0.05%) solution 7 mL  7 mL Mouth Rinse q12n4p Brand Males, MD   7 mL at 08/28/15 1600  . chlorhexidine gluconate (PERIDEX) 0.12 % solution 15 mL  15 mL Mouth Rinse BID Brand Males, MD   15 mL at 08/29/15 0800  . dexamethasone (DECADRON) injection 2 mg  2 mg Intravenous 6 times per day Brand Males, MD   2 mg at 08/29/15 1215  . [START ON 08/30/2015] furosemide (LASIX) injection 40 mg  40  mg Intravenous Daily Brand Males, MD      . heparin injection 5,000 Units  5,000 Units Subcutaneous 3 times per day Holley Raring, NP   5,000 Units at 08/29/15 1357  . hydrALAZINE (APRESOLINE) injection 10 mg  10 mg Intravenous Q4H PRN Chesley Mires, MD   10 mg at 08/28/15 2208  . ibuprofen (ADVIL,MOTRIN) 100 MG/5ML suspension 200 mg  200 mg Oral Q8H PRN Brand Males, MD   200 mg at 08/28/15 2343  . insulin aspart (novoLOG) injection 2-6 Units  2-6 Units Subcutaneous 6 times per day Holley Raring, NP   2 Units at 08/29/15 1215   . metoprolol (LOPRESSOR) injection 2.5-5 mg  2.5-5 mg Intravenous Q3H PRN Rigoberto Noel, MD   5 mg at 08/28/15 1957  . metoprolol tartrate (LOPRESSOR) tablet 25 mg  25 mg Oral BID Brand Males, MD   25 mg at 08/29/15 1048  . midazolam (VERSED) injection 2 mg  2 mg Intravenous Q2H PRN Chesley Mires, MD      . pantoprazole (PROTONIX) EC tablet 40 mg  40 mg Oral Daily Brand Males, MD   40 mg at 08/29/15 1048  . [START ON 08/30/2015] pneumococcal 23 valent vaccine (PNU-IMMUNE) injection 0.5 mL  0.5 mL Intramuscular Tomorrow-1000 Praveen Mannam, MD      . sodium chloride flush (NS) 0.9 % injection 10-40 mL  10-40 mL Intracatheter Q12H Praveen Mannam, MD   10 mL at 08/28/15 2200  . sodium chloride flush (NS) 0.9 % injection 10-40 mL  10-40 mL Intracatheter PRN Praveen Mannam, MD      . sodium chloride flush (NS) 0.9 % injection 3 mL  3 mL Intravenous Q12H Sherren Mocha, MD   3 mL at 08/28/15 2200  . sodium chloride flush (NS) 0.9 % injection 3 mL  3 mL Intravenous PRN Sherren Mocha, MD        Musculoskeletal: Strength & Muscle Tone: decreased Gait & Station: unable to stand Patient leans: N/A  Psychiatric Specialty Exam: ROS  No Fever-chills, No Headache, No changes with Vision or hearing, reports vertigo No problems swallowing food or Liquids, No Chest pain, Cough or Shortness of Breath, No Abdominal pain, No Nausea or Vommitting, Bowel movements are regular, No Blood in stool or Urine, No dysuria, No new skin rashes or bruises, No new joints pains-aches,  No new weakness, tingling, numbness in any extremity, No recent weight gain or loss, No polyuria, polydypsia or polyphagia,   A full 10 point Review of Systems was done, except as stated above, all other Review of Systems were negative.  Blood pressure 145/84, pulse 61, temperature 98.1 F (36.7 C), temperature source Oral, resp. rate 19, height '5\' 7"'  (1.702 m), weight 98 kg (216 lb 0.8 oz), SpO2 97 %.Body mass index is  33.83 kg/(m^2).  General Appearance: Casual  Eye Contact::  Good  Speech:  Clear and Coherent  Volume:  Normal  Mood:  Anxious and Depressed  Affect:  Appropriate and Congruent  Thought Process:  Coherent and Goal Directed  Orientation:  Full (Time, Place, and Person)  Thought Content:  WDL  Suicidal Thoughts:  No  Homicidal Thoughts:  No  Memory:  Immediate;   Fair Recent;   Fair  Judgement:  Intact  Insight:  Good  Psychomotor Activity:  Decreased  Concentration:  Good  Recall:  Good  Fund of Knowledge:Good  Language: Good  Akathisia:  Negative  Handed:  Right  AIMS (if indicated):  Assets:  Communication Skills Desire for Improvement Financial Resources/Insurance Housing Intimacy Leisure Time Resilience Social Support Transportation  ADL's:  Impaired  Cognition: WNL  Sleep:      Treatment Plan Summary: Daily contact with patient to assess and evaluate symptoms and progress in treatment and Medication management  Discontinue Seroquel which is a mood stabilizer and Adderall for increasing attention secondary to recent cardiac arrest and prolonged QTC We start Trileptal 300 mg twice daily as a mood stabilizer and continue Effexor 75 mg daily Appreciate psychiatric consultation and follow up as clinically required Please contact [phone] or [phone] if needs further assistance  Disposition: Patient does not meet criteria for psychiatric inpatient admission. Supportive therapy provided about ongoing stressors.  Durward Parcel., MD 08/29/2015 3:42 PM

## 2015-08-29 NOTE — Progress Notes (Signed)
Patient Name: Marie Ramos      SUBJECTIVE: with some sob No cp  Unaware of what happened Have told her she had had cardiaic arrest  Past Medical History  Diagnosis Date  . Bipolar 1 disorder ([place])   . Hypertrophic obstructive cardiomyopathy ([place]) 08/29/2015  . Cardiac arrest ([place]) 08/24/2015  . QT prolongation 08/29/2015    Scheduled Meds:  Scheduled Meds: . antiseptic oral rinse  7 mL Mouth Rinse q12n4p  . chlorhexidine gluconate  15 mL Mouth Rinse BID  . dexamethasone  4 mg Intravenous 6 times per day  . feeding supplement (PRO-STAT SUGAR FREE 64)  30 mL Per Tube BID  . furosemide  40 mg Intravenous 3 times per day  . heparin  5,000 Units Subcutaneous 3 times per day  . insulin aspart  2-6 Units Subcutaneous 6 times per day  . metoprolol tartrate  25 mg Per Tube BID  . pantoprazole sodium  40 mg Per Tube Daily  . [START ON 08/30/2015] pneumococcal 23 valent vaccine  0.5 mL Intramuscular Tomorrow-1000  . sodium chloride flush  10-40 mL Intracatheter Q12H  . sodium chloride flush  3 mL Intravenous Q12H   Continuous Infusions: . dexmedetomidine 0.8 mcg/kg/hr (08/29/15 0500)  . feeding supplement (VITAL HIGH PROTEIN) Stopped (08/28/15 0917)   sodium chloride, hydrALAZINE, ibuprofen, metoprolol, midazolam, sodium chloride flush, sodium chloride flush    PHYSICAL EXAM Filed Vitals:   08/29/15 0600 08/29/15 0630 08/29/15 0700 08/29/15 0730  BP: 118/67 116/68 128/72 126/72  Pulse: 62 64 64 64  Temp:   97.4 F (36.3 C)   TempSrc:   Axillary   Resp: 16 14 15 14   Height:      Weight:      SpO2: 96% 96% 96% 96%  Well developed and nourished in no acute distress HENT normal Neck supple with JVP-flat Clear Regular rate and rhythm, no murmurs or gallops Abd-soft with active BS No Clubbing cyanosis edema Skin-warm and dry A & Oriented  Grossly normal sensory and motor function    TELEMETRY: Reviewed telemetry pt in NSR:    Intake/Output Summary  (Last 24 hours) at 08/29/15 0807 Last data filed at 08/29/15 0700  Gross per 24 hour  Intake 1695.9 ml  Output   4225 ml  Net -2529.1 ml    LABS: Basic Metabolic Panel:  Recent Labs Lab 08/25/15 0410  08/25/15 1644 08/25/15 2000 08/25/15 2353 08/26/15 0400 08/26/15 0801 08/27/15 0344  08/28/15 0445 08/29/15 0515  NA 146*  < > 145 146* 146* 145 141 145  --   --  145  K 3.3*  < > 3.7 3.5 3.6 3.9 4.2 3.8  --   --  3.2*  CL 115*  < > 115* 117* 118* 117* 114* 120*  --   --  100*  CO2 21*  --   --  20* 19* 21* 19* 20*  --   --  27  GLUCOSE 104*  < > 94 92 90 93 107* 107*  --   --  134*  BUN 19  < > 19 18 17 17 16  29*  --   --  39*  CREATININE 0.68  < > 0.50 0.67 0.65 0.78 0.85 1.14*  --   --  1.21*  CALCIUM 7.7*  --   --  7.6* 7.6* 7.8* 7.7* 8.5*  --   --  9.2  MG 1.9  --   --   --   --   --   --   --   < >  1.8 2.1  PHOS 2.5  --   --   --   --   --   --   --   < > 2.3* 4.7*  < > = values in this interval not displayed. Cardiac Enzymes: No results for input(s): CKTOTAL, CKMB, CKMBINDEX, TROPONINI in the last 72 hours. CBC:  Recent Labs Lab 08/24/15 1300  08/25/15 0410 08/25/15 0819 08/25/15 1125 08/25/15 1644 08/26/15 0400 08/27/15 0344 08/29/15 0515  WBC 30.9*  --  11.3*  --   --   --  11.2* 10.5 16.7*  NEUTROABS  --   --   --   --   --   --   --   --  15.0*  HGB 12.6  < > 11.8* 12.2 12.2 12.2 11.6* 10.4* 12.3  HCT 39.0  < > 36.9 36.0 36.0 36.0 37.6 33.8* 38.4  MCV 91.5  --  90.7  --   --   --  92.6 92.9 90.6  PLT 524*  --  492*  --   --   --  496* 436* 473*  < > = values in this interval not displayed. PROTIME:    ASSESSMENT AND PLAN:  Active Problems:   Cardiac arrest ([place])   Acute respiratory failure ([facility])   Hypertrophic obstructive cardiomyopathy ([facility])   QT prolongation   Hypokalemia  Improving,  Suspect ( hope) BUN and WBC elevation may be related to steroids Will need MRI- Cardiac next  Aggressive repletion of hypokalemia Signed, Virl Axe  MD  08/29/2015

## 2015-08-29 NOTE — Progress Notes (Signed)
PULMONARY / CRITICAL CARE MEDICINE   Name: Marie Ramos MRN: [id number] DOB: 1955/11/23    ADMISSION DATE:  08/24/2015 CONSULTATION DATE: 08/24/15  REFERRING MD:    CHIEF COMPLAINT:  Cardiac arrest   has a past medical history of Bipolar 1 disorder ([place]); Hypertrophic obstructive cardiomyopathy ([place]) (08/29/2015); Cardiac arrest ([place]) (08/24/2015); and QT prolongation (08/29/2015).   has past surgical history that includes Cardiac catheterization (N/A, 08/24/2015).   CULTURES: Sputum 2/17 >>    LINES/TUBES: Rt I/J 2/27 >> ET tube 2/27 >>  ANTIBIOTICS: None   STUDIES and EVENTs:  CT scan 2/17 > no acute abnormality Echo 2/17 > LVEF 40-45%, severe asymmetric septal hypertrophy without dynamic obstruction, suggestive of hypertrophic nonobstructive cardiomyopathy, diffuse hypokinesis, grade 1 diastolic dysfunction Cath 2/17 - NORMAL EEG 2/17 > QTc 530 EKG 2/17 > QTc 645 -  2/17  Admit with Cardiac arrest, LHC with no disease.  Arrest thought related to PSY medications with prolonged QTc   EVENTS 2/19 RN reports pt awake, following commands, BP elevated to A999333 systolic.  Treated with 5mg  IV Lopressor   08/27/15 - normal wua. 40% fio2 -> pulse ox 92%. Just starting SBT. EKG 493 msec on QTc   08/28/15 - diuresed 4L . Doing SBT. Meets extubation criteira. ABG on SBT oka. A bit sleepy despite being off precedex x 67min. Feels she is ready for extubation. Ongoing diuresis +   SUBJECTIVE/OVERNIGHT/INTERVAL HX 08/29/15  - extubaetd yesterday. Transient stridor and Rx with decadron x 48h. Now only hoarseness. Able to eat. Anxious and restless and needing precedex gtt since yesterday; being weaned off slowly. Husband wants psych input. RN feels patient needs 1 more day ICU  VITAL SIGNS: BP 158/84 mmHg  Pulse 60  Temp(Src) 97.4 F (36.3 C) (Axillary)  Resp 22  Ht 5\' 7"  (1.702 m)  Wt 98 kg (216 lb 0.8 oz)  BMI 33.83 kg/m2  SpO2 95%  HEMODYNAMICS: CVP:  [3 mmHg-4 mmHg]  3 mmHg  VENTILATOR SETTINGS:    INTAKE / OUTPUT: I/O last 3 completed shifts: In: 2702.6 [P.O.:960; I.V.:584.2; NG/GT:855; IV Piggyback:303.3] Out: 6925 [Urine:6925]  PHYSICAL EXAMINATION: General:  Obese female,in chair Neuro: RASS 0 to +1, Moves all 4s, CAM-ICU neg for delirium - on precedex gtt HEENT:  Normocephalic, atraumatic. Non icteric.  Cardiovascular:  S1,S2 rrr, rrr, no mumur, gallop or rub noted. Lungs: Clear bilaterally, with good chest expansion Abdomen: soft, large, with good bowel sounds,  Musculoskeletal:  No acute deformities  Skin:  No rashes or lesions  LABS: PULMONARY  Recent Labs Lab 08/26/15 1536 08/26/15 2016 08/27/15 0345 08/27/15 0941 08/28/15 0905  PHART 7.170* 7.297* 7.365 7.245* 7.402  PCO2ART 50.1* 37.4 33.0* 47.6* 45.7*  PO2ART 66.0* 96.0 121* 79.0* 85.0  HCO3 18.3* 18.2* 18.4* 20.4 28.3*  TCO2 20 19 19.4 22 30   O2SAT 87.0 96.0 98.4 92.0 96.0    CBC  Recent Labs Lab 08/26/15 0400 08/27/15 0344 08/29/15 0515  HGB 11.6* 10.4* 12.3  HCT 37.6 33.8* 38.4  WBC 11.2* 10.5 16.7*  PLT 496* 436* 473*    COAGULATION  Recent Labs Lab 08/24/15 1650 08/24/15 2140  INR 1.27 1.13    CARDIAC    Recent Labs Lab 08/24/15 1650 08/25/15 0050 08/25/15 0410 08/25/15 1130 08/25/15 1645  TROPONINI 1.16* 0.86* 0.65* 0.36* 0.24*   No results for input(s): PROBNP in the last 168 hours.   CHEMISTRY  Recent Labs Lab 08/24/15 1838  08/25/15 0410  08/25/15 2353 08/26/15 0400 08/26/15 0801 08/27/15  [id number] 08/27/15 0856 08/28/15 0445 08/29/15 0515  NA  --   < > 146*  < > 146* 145 141 145  --   --  145  K  --   < > 3.3*  < > 3.6 3.9 4.2 3.8  --   --  3.2*  CL  --   < > 115*  < > 118* 117* 114* 120*  --   --  100*  CO2  --   --  21*  < > 19* 21* 19* 20*  --   --  27  GLUCOSE  --   < > 104*  < > 90 93 107* 107*  --   --  134*  BUN  --   < > 19  < > 17 17 16  29*  --   --  39*  CREATININE  --   < > 0.68  < > 0.65 0.78 0.85 1.14*  --    --  1.21*  CALCIUM  --   --  7.7*  < > 7.6* 7.8* 7.7* 8.5*  --   --  9.2  MG 2.2  --  1.9  --   --   --   --   --  1.9 1.8 2.1  PHOS 4.1  --  2.5  --   --   --   --   --  2.2* 2.3* 4.7*  < > = values in this interval not displayed. Estimated Creatinine Clearance: 59.5 mL/min (by C-G formula based on Cr of 1.21).   LIVER  Recent Labs Lab 08/24/15 1300 08/24/15 1650 08/24/15 2140  AST 94*  --   --   ALT 73*  --   --   ALKPHOS 123  --   --   BILITOT 0.4  --   --   PROT 6.8  --   --   ALBUMIN 3.5  --   --   INR  --  1.27 1.13     INFECTIOUS  Recent Labs Lab 08/24/15 1650 08/25/15 0410 08/26/15 0400  PROCALCITON 0.21 0.58 0.54     ENDOCRINE CBG (last 3)   Recent Labs  08/28/15 2349 08/29/15 0422 08/29/15 0743  GLUCAP 109* 137* 119*         IMAGING x48h  - image(s) personally visualized  -   highlighted in bold Dg Chest Port 1 View  08/28/2015  CLINICAL DATA:  Endotracheal tube position EXAM: PORTABLE CHEST 1 VIEW COMPARISON:  08/27/2015 FINDINGS: Endotracheal tube is in good position approximately 3 cm above the carina. NG tube in place with the tip not visualized. Right jugular catheter tip at the cavoatrial junction. No pneumothorax Improvement in bibasilar airspace disease compared with the prior study. There remains left greater than right bibasilar airspace disease. No significant pleural effusion. IMPRESSION: Endotracheal tube now in good position Improved aeration with decrease in bibasilar airspace disease compared with yesterday Electronically Signed   By: Franchot Gallo M.D.   On: 08/28/2015 07:12        DISCUSSION: 60 years old female was admitted on 08/24/15 with cardiac arrest ROSC estimated at 10 minutes. Emergent left heart cath with no disease found and hypothermia protocol.  Prolonged Qtc.  ASSESSMENT / PLAN:  PULMONARY A: Acute Respiratory failure due to cardiac arrest s/p extubation 08/28/15  - stridor post extubatio Rx with decadron  and now hoarseness  P:   pulm toilet  Decadron x 48h  CARDIOVASCULAR A:  Cardiopulmonary arrest - shockable rhythm  with AED x2, 10 min CPR.  LHC without CAD Concern for Nonobstructive Hypertrophic Cardiomyopathy on ECHO Bradycardia  Prolonged QTc - in setting of psy medications Hypotension - post arrest + sedation   - normal hemodynamic. EKG QTc normal. Diuresing  P:  decrease lasix - cards to adjust if concern due to HOCM ? Needs formal cards consult - will check with EP Goal MAP > 65 Aspirin daily Defer Heparin and other antiplatelets to Cardiology/EP Likely will need cardiac MRI in future for further evaluation Lopressor 25mg  BID + Q3 PRN for SBP >160 Avoid QT prolonging medications    RENAL A:   Mild AKI - resolved.    - Mild low  K  P:   Replete K Avoid hypotension, ensure adequate hydration Trend BMP / UOP  Replace electrolytes as indicated    GASTROINTESTINAL A:   Obesity   - tolerating diet P:   PPI for stress ulcer prophylaxis HH diet  HEMATOLOGIC A:   Leukocytosis - ? Stress response P:  Trend CBC Transfuse per icu protocol  INFECTIOUS A:   No evidence of acute infectious process P:   Follow cultures as above Trend PCT   ENDOCRINE A:   Mild Hyperglycemia  P:   Monitor BS q4 hours SSI coverage  NEUROLOGIC A:   Acute encephalopathy high risk for Anoxic brain injury due to cardiac arrest Bipolar Depression   - but anxious and restelss when percedex weaned. And mild confusion + per RN P:   Dc precedex gtt - RN to wean off Psych called Start xanax prn RASS goal: 0 to -1   FAMILY  - Updates:  Family updated at bedside 2/19.     None at bedside 08/27/15. Son updated 08/28/2015. Son and husband updated at bedside 08/29/15   - Inter-disciplinary family meet or Palliative Care meeting due by:  08/31/15     GLOBAL Keep in icu x 1 more day    The patient is critically ill with multiple organ systems failure and requires high  complexity decision making for assessment and support, frequent evaluation and titration of therapies, application of advanced monitoring technologies and extensive interpretation of multiple databases.   Critical Care Time devoted to patient care services described in this note is  30  Minutes. This time reflects time of care of this signee Dr Brand Males. This critical care time does not reflect procedure time, or teaching time or supervisory time of PA/NP/Med student/Med Resident etc but could involve care discussion time    Dr. Brand Males, M.D., [facility].C.P Pulmonary and Critical Care Medicine Staff Physician [place] [facility] Pager: [phone], If no answer or between  15:00h - 7:00h: call [phone]  08/29/2015 11:56 AM

## 2015-08-30 ENCOUNTER — Encounter (HOSPITAL_COMMUNITY): Payer: Self-pay | Admitting: General Practice

## 2015-08-30 DIAGNOSIS — I5023 Acute on chronic systolic (congestive) heart failure: Secondary | ICD-10-CM | POA: Diagnosis present

## 2015-08-30 DIAGNOSIS — I10 Essential (primary) hypertension: Secondary | ICD-10-CM | POA: Diagnosis present

## 2015-08-30 DIAGNOSIS — I469 Cardiac arrest, cause unspecified: Secondary | ICD-10-CM

## 2015-08-30 LAB — BASIC METABOLIC PANEL
Anion gap: 12 (ref 5–15)
BUN: 50 mg/dL — AB (ref 6–20)
CHLORIDE: 101 mmol/L (ref 101–111)
CO2: 26 mmol/L (ref 22–32)
Calcium: 9.1 mg/dL (ref 8.9–10.3)
Creatinine, Ser: 1.26 mg/dL — ABNORMAL HIGH (ref 0.44–1.00)
GFR calc Af Amer: 53 mL/min — ABNORMAL LOW (ref >60)
GFR calc non Af Amer: 45 mL/min — ABNORMAL LOW (ref >60)
GLUCOSE: 118 mg/dL — AB (ref 65–99)
POTASSIUM: 3 mmol/L — AB (ref 3.5–5.1)
Sodium: 139 mmol/L (ref 135–145)

## 2015-08-30 LAB — CBC WITH DIFFERENTIAL/PLATELET
Basophils Absolute: 0 10*3/uL (ref 0.0–0.1)
Basophils Relative: 0 %
EOS PCT: 0 %
Eosinophils Absolute: 0 10*3/uL (ref 0.0–0.7)
HCT: 39.5 % (ref 36.0–46.0)
HEMOGLOBIN: 12.8 g/dL (ref 12.0–15.0)
LYMPHS ABS: 1.4 10*3/uL (ref 0.7–4.0)
LYMPHS PCT: 10 %
MCH: 28.9 pg (ref 26.0–34.0)
MCHC: 32.4 g/dL (ref 30.0–36.0)
MCV: 89.2 fL (ref 78.0–100.0)
Monocytes Absolute: 0.7 10*3/uL (ref 0.1–1.0)
Monocytes Relative: 5 %
NEUTROS PCT: 85 %
Neutro Abs: 11.6 10*3/uL — ABNORMAL HIGH (ref 1.7–7.7)
Platelets: 516 10*3/uL — ABNORMAL HIGH (ref 150–400)
RBC: 4.43 MIL/uL (ref 3.87–5.11)
RDW: 15.2 % (ref 11.5–15.5)
WBC: 13.7 10*3/uL — AB (ref 4.0–10.5)

## 2015-08-30 LAB — GLUCOSE, CAPILLARY
GLUCOSE-CAPILLARY: 110 mg/dL — AB (ref 65–99)
GLUCOSE-CAPILLARY: 121 mg/dL — AB (ref 65–99)
GLUCOSE-CAPILLARY: 186 mg/dL — AB (ref 65–99)
Glucose-Capillary: 87 mg/dL (ref 65–99)
Glucose-Capillary: 91 mg/dL (ref 65–99)

## 2015-08-30 LAB — MAGNESIUM: Magnesium: 2.1 mg/dL (ref 1.7–2.4)

## 2015-08-30 LAB — PHOSPHORUS: Phosphorus: 4.8 mg/dL — ABNORMAL HIGH (ref 2.5–4.6)

## 2015-08-30 MED ORDER — POTASSIUM CHLORIDE CRYS ER 20 MEQ PO TBCR
40.0000 meq | EXTENDED_RELEASE_TABLET | Freq: Once | ORAL | Status: AC
  Administered 2015-08-30: 40 meq via ORAL
  Filled 2015-08-30: qty 2

## 2015-08-30 MED ORDER — IBUPROFEN 200 MG PO TABS
200.0000 mg | ORAL_TABLET | Freq: Three times a day (TID) | ORAL | Status: DC | PRN
Start: 2015-08-30 — End: 2015-08-30
  Administered 2015-08-30: 200 mg via ORAL
  Filled 2015-08-30: qty 1

## 2015-08-30 MED ORDER — ACETAMINOPHEN 325 MG PO TABS
650.0000 mg | ORAL_TABLET | Freq: Four times a day (QID) | ORAL | Status: DC | PRN
  Administered 2015-08-30 – 2015-09-03 (×2): 650 mg via ORAL
  Filled 2015-08-30 (×2): qty 2

## 2015-08-30 MED ORDER — LISINOPRIL 5 MG PO TABS
5.0000 mg | ORAL_TABLET | Freq: Every day | ORAL | Status: DC
  Administered 2015-08-30 – 2015-08-31 (×2): 5 mg via ORAL
  Filled 2015-08-30 (×2): qty 1

## 2015-08-30 MED ORDER — IBUPROFEN 100 MG/5ML PO SUSP
200.0000 mg | Freq: Three times a day (TID) | ORAL | Status: DC | PRN
  Filled 2015-08-30: qty 10

## 2015-08-30 MED ORDER — VENLAFAXINE HCL ER 75 MG PO CP24
75.0000 mg | ORAL_CAPSULE | Freq: Every day | ORAL | Status: DC
  Administered 2015-08-30 – 2015-09-05 (×7): 75 mg via ORAL
  Filled 2015-08-30 (×7): qty 1

## 2015-08-30 MED ORDER — CARVEDILOL 6.25 MG PO TABS
6.2500 mg | ORAL_TABLET | Freq: Two times a day (BID) | ORAL | Status: DC
  Administered 2015-08-30 – 2015-09-05 (×13): 6.25 mg via ORAL
  Filled 2015-08-30 (×14): qty 1

## 2015-08-30 NOTE — Progress Notes (Addendum)
SUBJECTIVE: The patient is doing well today.  At this time, she denies chest pain, shortness of breath, or any new concerns.  CURRENT MEDICATIONS: . antiseptic oral rinse  7 mL Mouth Rinse q12n4p  . chlorhexidine gluconate  15 mL Mouth Rinse BID  . dexamethasone  2 mg Intravenous 6 times per day  . furosemide  40 mg Intravenous Daily  . heparin  5,000 Units Subcutaneous 3 times per day  . insulin aspart  2-6 Units Subcutaneous 6 times per day  . metoprolol tartrate  25 mg Oral BID  . OXcarbazepine  300 mg Oral BID  . pantoprazole  40 mg Oral Daily  . pneumococcal 23 valent vaccine  0.5 mL Intramuscular Tomorrow-1000  . sodium chloride flush  10-40 mL Intracatheter Q12H  . sodium chloride flush  3 mL Intravenous Q12H      OBJECTIVE: Physical Exam: Filed Vitals:   08/30/15 0500 08/30/15 0600 08/30/15 0800 08/30/15 0846  BP: 166/87 159/87  181/88  Pulse: 71 70    Temp:   97.6 F (36.4 C)   TempSrc:   Oral   Resp: 20 15    Height:      Weight:      SpO2: 100% 100%      Intake/Output Summary (Last 24 hours) at 08/30/15 0856 Last data filed at 08/30/15 0600  Gross per 24 hour  Intake  269.7 ml  Output   2100 ml  Net -1830.3 ml    Telemetry reveals sinus rhythm  GEN- The patient is ill appearing, alert but confused Head- normocephalic, atraumatic Eyes-  Sclera clear, conjunctiva pink Ears- hearing intact Oropharynx- clear Neck- supple Lungs- Clear to ausculation bilaterally, normal work of breathing Heart- Regular rate and rhythm,  GI- soft, NT, ND, + BS Extremities- no clubbing, cyanosis, or edema Skin- no rash or lesion Psych- depressed mood, flat affect Neuro- strength and sensation are intact  LABS: Basic Metabolic Panel:  Recent Labs  08/29/15 0515 08/30/15 0615  NA 145 139  K 3.2* 3.0*  CL 100* 101  CO2 27 26  GLUCOSE 134* 118*  BUN 39* 50*  CREATININE 1.21* 1.26*  CALCIUM 9.2 9.1  MG 2.1 2.1  PHOS 4.7* 4.8*   CBC:  Recent Labs  08/29/15 0515 08/30/15 0615  WBC 16.7* 13.7*  NEUTROABS 15.0* 11.6*  HGB 12.3 12.8  HCT 38.4 39.5  MCV 90.6 89.2  PLT 473* 516*    RADIOLOGY: Dg Chest Port 1 View 08/28/2015  CLINICAL DATA:  Endotracheal tube position EXAM: PORTABLE CHEST 1 VIEW COMPARISON:  08/27/2015 FINDINGS: Endotracheal tube is in good position approximately 3 cm above the carina. NG tube in place with the tip not visualized. Right jugular catheter tip at the cavoatrial junction. No pneumothorax Improvement in bibasilar airspace disease compared with the prior study. There remains left greater than right bibasilar airspace disease. No significant pleural effusion. IMPRESSION: Endotracheal tube now in good position Improved aeration with decrease in bibasilar airspace disease compared with yesterday Electronically Signed   By: Franchot Gallo M.D.   On: 08/28/2015 07:12    ASSESSMENT AND PLAN:  Active Problems:   Cardiac arrest (HCC)   Acute respiratory failure (HCC)   Hypertrophic obstructive cardiomyopathy (H[facility])   QT prolongation   Hypokalemia  1. VF/PEA arrest The patient had an OOH resuscitated cardiac arrest presumed to be VF with AED advising shocks. She received immediate bystander CPR. Estimated downtime 10 minutes. Cath this admisssion with no CAD. Echo suggestive of possible  nonobstructive hypertrophic CM and EF 40% (possibly due to myocardial stunning). She is making improvements.  Transferring to telemetry today.   No further arrhythmias noted.  Keep K >3.9, Mg >1.8 Avoid QT prolonging medications Cardiac MRI tomorrow to evaluate for HCM    2. Acute systolic dysfunction Likely due to myocardial stunning with cardiac arrest Cardiac MRI tomorrow will help [name] better assess Switch metoprolol to coreg Add ace inhibitor Appears to be drying out Will stop IV lasix and monitor  3. Psych Appreciate input Need to avoid QT prolonging drugs   4. Hypertension Add ace inhibitor Switch metoprolol to  coreg  Thompson Grayer, MD 08/30/2015 8:56 AM

## 2015-08-30 NOTE — Progress Notes (Signed)
PULMONARY / CRITICAL CARE MEDICINE   Name: Marie Ramos MRN: [id number] DOB: 1956/03/25    ADMISSION DATE:  08/24/2015 CONSULTATION DATE: 08/24/15  REFERRING MD:    CHIEF COMPLAINT:  Cardiac arrest   has a past medical history of Bipolar 1 disorder (Marie Ramos); Hypertrophic obstructive cardiomyopathy (Marie Ramos) (08/29/2015); Cardiac arrest (Marie Ramos) (08/24/2015); and QT prolongation (08/29/2015).   has past surgical history that includes Cardiac catheterization (N/A, 08/24/2015).   CULTURES: Sputum 2/17 >>    LINES/TUBES: Rt I/J 2/17 >>2/23 ET tube 2/17 - 2/21  ANTIBIOTICS: None   STUDIES and EVENTs:  CT scan 2/17 > no acute abnormality Echo 2/17 > LVEF 40-45%, severe asymmetric septal hypertrophy without dynamic obstruction, suggestive of hypertrophic nonobstructive cardiomyopathy, diffuse hypokinesis, grade 1 diastolic dysfunction Cath 2/17 - NORMAL EEG 2/17 > QTc 530 EKG 2/17 > QTc 645 -  2/17  Admit with Cardiac arrest, LHC with no disease.  Arrest thought related to PSY medications with prolonged QTc   EVENTS 2/19 RN reports pt awake, following commands, BP elevated to A999333 systolic.  Treated with 5mg  IV Lopressor   08/27/15 - normal wua. 40% fio2 -> pulse ox 92%. Just starting SBT. EKG 493 msec on QTc   08/28/15 - diuresed 4L . Doing SBT. Meets extubation criteira. ABG on SBT oka. A bit sleepy despite being off precedex x 65min. Feels she is ready for extubation. Ongoing diuresis +    08/29/15  - extubaetd yesterday. Transient stridor and Rx with decadron x 48h. Now only hoarseness. Able to eat. Anxious and restless and needing precedex gtt since yesterday; being weaned off slowly. Husband wants psych input. RN feels patient needs 1 more day ICU   SUBJECTIVE/OVERNIGHT/INTERVAL HX 08/30/15 - remains extubated. Mostly oriented per RN but clumsy with eating and periodically "goofy". Deconditione. Hoarseness improved a lot. BP remains high - EP Dr Marie Ramos will adjust. He plans  for MRI and based on it will decide on calling general cards or not. Gets anxious and xanax helps. Psych seen her yesterday and stared her 2 meds   VITAL SIGNS: BP 181/88 mmHg  Pulse 70  Temp(Src) 97.6 F (36.4 C) (Oral)  Resp 15  Ht 5\' 7"  (1.702 m)  Wt 98.431 kg (217 lb)  BMI 33.98 kg/m2  SpO2 100%  HEMODYNAMICS:    VENTILATOR SETTINGS:    INTAKE / OUTPUT: I/O last 3 completed shifts: In: 48 [P.O.:960; I.V.:591; IV Piggyback:50] Out: 3825 [Urine:3825]  PHYSICAL EXAMINATION: General:  Obese female,in chair Neuro: RASS 0 to +1, Moves all 4s, CAM-ICU neg for delirium but reports are she is mildly confused HEENT:  Normocephalic, atraumatic. Non icteric.  Cardiovascular:  S1,S2 rrr, rrr, no mumur, gallop or rub noted. Lungs: Clear bilaterally, with good chest expansion Abdomen: soft, large, with good bowel sounds,  Musculoskeletal:  No acute deformities  Skin:  No rashes or lesions  LABS: PULMONARY  Recent Labs Lab 08/26/15 1536 08/26/15 2016 08/27/15 0345 08/27/15 0941 08/28/15 0905  PHART 7.170* 7.297* 7.365 7.245* 7.402  PCO2ART 50.1* 37.4 33.0* 47.6* 45.7*  PO2ART 66.0* 96.0 121* 79.0* 85.0  HCO3 18.3* 18.2* 18.4* 20.4 28.3*  TCO2 20 19 19.4 22 30   O2SAT 87.0 96.0 98.4 92.0 96.0    CBC  Recent Labs Lab 08/27/15 0344 08/29/15 0515 08/30/15 0615  HGB 10.4* 12.3 12.8  HCT 33.8* 38.4 39.5  WBC 10.5 16.7* 13.7*  PLT 436* 473* 516*    COAGULATION  Recent Labs Lab 08/24/15 1650 08/24/15 2140  INR 1.27 1.13  CARDIAC    Recent Labs Lab 08/24/15 1650 08/25/15 0050 08/25/15 0410 08/25/15 1130 08/25/15 1645  TROPONINI 1.16* 0.86* 0.65* 0.36* 0.24*   No results for input(s): PROBNP in the last 168 hours.   CHEMISTRY  Recent Labs Lab 08/25/15 0410  08/26/15 0400 08/26/15 0801 08/27/15 0344 08/27/15 0856 08/28/15 0445 08/29/15 0515 08/30/15 0615  NA 146*  < > 145 141 145  --   --  145 139  K 3.3*  < > 3.9 4.2 3.8  --   --   3.2* 3.0*  CL 115*  < > 117* 114* 120*  --   --  100* 101  CO2 21*  < > 21* 19* 20*  --   --  27 26  GLUCOSE 104*  < > 93 107* 107*  --   --  134* 118*  BUN 19  < > 17 16 29*  --   --  39* 50*  CREATININE 0.68  < > 0.78 0.85 1.14*  --   --  1.21* 1.26*  CALCIUM 7.7*  < > 7.8* 7.7* 8.5*  --   --  9.2 9.1  MG 1.9  --   --   --   --  1.9 1.8 2.1 2.1  PHOS 2.5  --   --   --   --  2.2* 2.3* 4.7* 4.8*  < > = values in this interval not displayed. Estimated Creatinine Clearance: 57.2 mL/min (by C-G formula based on Cr of 1.26).   LIVER  Recent Labs Lab 08/24/15 1300 08/24/15 1650 08/24/15 2140  AST 94*  --   --   ALT 73*  --   --   ALKPHOS 123  --   --   BILITOT 0.4  --   --   PROT 6.8  --   --   ALBUMIN 3.5  --   --   INR  --  1.27 1.13     INFECTIOUS  Recent Labs Lab 08/24/15 1650 08/25/15 0410 08/26/15 0400  PROCALCITON 0.21 0.58 0.54     ENDOCRINE CBG (last 3)   Recent Labs  08/30/15 0007 08/30/15 0413 08/30/15 0833  GLUCAP 87 91 110*         IMAGING x48h  - image(s) personally visualized  -   highlighted in bold No results found.      DISCUSSION: 60 years old female was admitted on 08/24/15 with cardiac arrest ROSC estimated at 10 minutes. Emergent left heart cath with no disease found and hypothermia protocol.  Prolonged Qtc.  ASSESSMENT / PLAN:  PULMONARY A: Acute Respiratory failure due to cardiac arrest s/p extubation 08/28/15  - stridor post extubatio Rx - resolved with 48h decadron as of 08/30/15  P:   pulm toilet  Dc Decadron   CARDIOVASCULAR A:  Cardiopulmonary arrest - shockable rhythm with AED x2, 10 min CPR.  LHC without CAD Concern for Nonobstructive Hypertrophic Cardiomyopathy on ECHO Bradycardia  Prolonged QTc - in setting of psy medications Hypotension - post arrest + sedation   - normal hemodynamic but BP high. EKG QTc normal 0.49 yesterday. Diuresing  P:  Lasix daily 40mg  - EP will adjus further ? Needs formal cards  consult - d/w Dr Marie Ramos and he will decide on gen cards depending on MRI Goal MAP > 65 Aspirin daily Needs cardiac MRI in future for further evaluation Avoid QT prolonging medications  Rest per EP   RENAL A:   Mild AKI - resolved.    -  Mild low  K  P:   Replete K Avoid hypotension, ensure adequate hydration Trend BMP / UOP  Replace electrolytes as indicated    GASTROINTESTINAL A:   Obesity   - tolerating diet P:   PPI for stress ulcer prophylaxis HH diet  HEMATOLOGIC A:   Leukocytosis - ? Stress response P:  Trend CBC Transfuse per icu protocol  INFECTIOUS A:   No evidence of acute infectious process P:   Follow cultures as above   ENDOCRINE A:   Mild Hyperglycemia  P:   Monitor BS q4 hours SSI coverage  NEUROLOGIC A:   Acute encephalopathy high risk for Anoxic brain injury due to cardiac arrest Bipolar Depression   - intermittent anxiety - neding xanax prn. Off precedex.  - mild congitiive impairment noted by family and Therapist, sports  . Seen by psych 08/29/15 and strted on trileptal and with advise to continue effexon 75mg  per day P:   - cotninue xanax prn - trileptal per psych since 08/29/15   - restart home effexor 08/30/15   FAMILY  - Updates:  Family updated at bedside 2/19.     None at bedside 08/27/15. Son updated 08/28/2015. Son and husband updated at bedside 08/29/15. Son and patientupdated 08/30/15   - Inter-disciplinary family meet or Palliative Care meeting due by:  08/31/15     GLOBAL Dc cvl. Move to tele 08/30/2015 . TRH primary from 08/31/15 and PCCM off - d/w Dr Sherral Hammers    Dr. Brand Males, M.D., [facility].C.P Pulmonary and Critical Care Medicine Staff Physician [place] [facility] Pager: [phone], If no answer or between  15:00h - 7:00h: call [phone]  08/30/2015 9:12 AM

## 2015-08-31 ENCOUNTER — Inpatient Hospital Stay (HOSPITAL_COMMUNITY): Payer: BLUE CROSS/BLUE SHIELD

## 2015-08-31 LAB — BASIC METABOLIC PANEL
Anion gap: 12 (ref 5–15)
BUN: 57 mg/dL — AB (ref 6–20)
CHLORIDE: 102 mmol/L (ref 101–111)
CO2: 24 mmol/L (ref 22–32)
CREATININE: 1.21 mg/dL — AB (ref 0.44–1.00)
Calcium: 9.2 mg/dL (ref 8.9–10.3)
GFR calc Af Amer: 55 mL/min — ABNORMAL LOW (ref >60)
GFR calc non Af Amer: 48 mL/min — ABNORMAL LOW (ref >60)
Glucose, Bld: 86 mg/dL (ref 65–99)
POTASSIUM: 3.2 mmol/L — AB (ref 3.5–5.1)
SODIUM: 138 mmol/L (ref 135–145)

## 2015-08-31 LAB — CBC WITH DIFFERENTIAL/PLATELET
Basophils Absolute: 0 10*3/uL (ref 0.0–0.1)
Basophils Relative: 0 %
EOS ABS: 0 10*3/uL (ref 0.0–0.7)
EOS PCT: 0 %
HCT: 39.6 % (ref 36.0–46.0)
HEMOGLOBIN: 12.8 g/dL (ref 12.0–15.0)
LYMPHS ABS: 2.5 10*3/uL (ref 0.7–4.0)
LYMPHS PCT: 19 %
MCH: 29.5 pg (ref 26.0–34.0)
MCHC: 32.3 g/dL (ref 30.0–36.0)
MCV: 91.2 fL (ref 78.0–100.0)
MONOS PCT: 15 %
Monocytes Absolute: 2 10*3/uL — ABNORMAL HIGH (ref 0.1–1.0)
NEUTROS PCT: 66 %
Neutro Abs: 8.8 10*3/uL — ABNORMAL HIGH (ref 1.7–7.7)
Platelets: 450 10*3/uL — ABNORMAL HIGH (ref 150–400)
RBC: 4.34 MIL/uL (ref 3.87–5.11)
RDW: 15.4 % (ref 11.5–15.5)
WBC: 13.3 10*3/uL — ABNORMAL HIGH (ref 4.0–10.5)

## 2015-08-31 LAB — GLUCOSE, CAPILLARY
GLUCOSE-CAPILLARY: 204 mg/dL — AB (ref 65–99)
GLUCOSE-CAPILLARY: 159 mg/dL — AB (ref 65–99)
GLUCOSE-CAPILLARY: 110 mg/dL — AB (ref 65–99)
Glucose-Capillary: 149 mg/dL — ABNORMAL HIGH (ref 65–99)
Glucose-Capillary: 111 mg/dL — ABNORMAL HIGH (ref 65–99)

## 2015-08-31 LAB — PHOSPHORUS: Phosphorus: 4.1 mg/dL (ref 2.5–4.6)

## 2015-08-31 LAB — MAGNESIUM: Magnesium: 2.4 mg/dL (ref 1.7–2.4)

## 2015-08-31 MED ORDER — LISINOPRIL 10 MG PO TABS
10.0000 mg | ORAL_TABLET | Freq: Every day | ORAL | Status: DC
  Administered 2015-09-01: 10 mg via ORAL
  Filled 2015-08-31: qty 1

## 2015-08-31 MED ORDER — GADOBENATE DIMEGLUMINE 529 MG/ML IV SOLN
30.0000 mL | Freq: Once | INTRAVENOUS | Status: AC | PRN
  Administered 2015-08-31: 30 mL via INTRAVENOUS

## 2015-08-31 MED ORDER — DM-GUAIFENESIN ER 30-600 MG PO TB12
1.0000 | ORAL_TABLET | Freq: Two times a day (BID) | ORAL | Status: DC
Start: 2015-08-31 — End: 2015-09-05
  Administered 2015-08-31 – 2015-09-05 (×11): 1 via ORAL
  Filled 2015-08-31 (×2): qty 1
  Filled 2015-08-31: qty 2
  Filled 2015-08-31 (×9): qty 1

## 2015-08-31 MED ORDER — POTASSIUM CHLORIDE CRYS ER 20 MEQ PO TBCR
40.0000 meq | EXTENDED_RELEASE_TABLET | Freq: Two times a day (BID) | ORAL | Status: AC
Start: 2015-08-31 — End: 2015-08-31
  Administered 2015-08-31 (×2): 40 meq via ORAL
  Filled 2015-08-31 (×2): qty 2

## 2015-08-31 MED ORDER — SENNOSIDES-DOCUSATE SODIUM 8.6-50 MG PO TABS
2.0000 | ORAL_TABLET | Freq: Two times a day (BID) | ORAL | Status: DC
  Administered 2015-08-31: 2 via ORAL
  Filled 2015-08-31 (×3): qty 2

## 2015-08-31 NOTE — Progress Notes (Signed)
Utilization review completed.  

## 2015-08-31 NOTE — Progress Notes (Signed)
Physical Therapy Treatment Patient Details Name: Marie Ramos MRN: [id number] DOB: [birth year] Today's Date: 08/31/2015    History of Present Illness 60 year old with history of bipolar disorder. Admitted after a witnessed cardiac arrest, intubated initially, emergently taken for heart cath and placed on hypothermic protocol. Encepholapthy noted.    PT Comments    Patient progressing slowly towards PT goals. Continues to be confused and impulsive during mobility putting pt at increased risk for falls. Tolerated gait training today with Min A for balance. Fatigues quickly and limited by dizziness. Encouraged OOB in chair and exercises while in room. Instructed pt in bracing due to pain in chest from CPR. Will continue to follow per current POC.  Follow Up Recommendations  CIR (pending progress)     Equipment Recommendations  Other (comment) (TBA)    Recommendations for Other Services       Precautions / Restrictions Precautions Precautions: Fall Restrictions Weight Bearing Restrictions: No    Mobility  Bed Mobility Overal bed mobility: Needs Assistance Bed Mobility: Supine to Sit     Supine to sit: Mod assist     General bed mobility comments: assist to elevate trunk and use of rail for support. Increased time to perform.  Transfers Overall transfer level: Needs assistance Equipment used: Rolling walker (2 wheeled);None Transfers: Sit to/from Stand Sit to Stand: Min assist Stand pivot transfers: Mod assist       General transfer comment: Assist to boost from EOB with cues for hand placement/technique. SPT bed to [facility] and BSC to chair with Mod A for balance requiring BUE support.   Ambulation/Gait Ambulation/Gait assistance: Min assist Ambulation Distance (Feet): 50 Feet (+ 50') Assistive device: Rolling walker (2 wheeled) Gait Pattern/deviations: Step-through pattern;Decreased stride length;Staggering left;Staggering right;Drifts right/left;Trunk flexed    Gait velocity interpretation: at or above normal speed for age/gender General Gait Details: Impulsive, unsteady gait with staggering noted to left/right. + dizziness. Bumping into walls/obstacles in hallway. Easily distracted. 1 seated rest break due to dizziness forced by therapist.   Stairs            Wheelchair Mobility    Modified Rankin (Stroke Patients Only)       Balance Overall balance assessment: Needs assistance Sitting-balance support: Feet supported;No upper extremity supported Sitting balance-Leahy Scale: Fair Sitting balance - Comments: Fatigues easily in trunk musculature resulting in need for back support.   Standing balance support: During functional activity Standing balance-Leahy Scale: Poor Standing balance comment: Reliant on BUE support during dynamic and static standing.                    Cognition Arousal/Alertness: Awake/alert Behavior During Therapy: Impulsive Overall Cognitive Status: Impaired/Different from baseline Area of Impairment: Orientation;Memory;Following commands;Safety/judgement Orientation Level: Disoriented to;Time   Memory: Decreased short-term memory Following Commands: Follows one step commands with increased time Safety/Judgement: Decreased awareness of safety;Decreased awareness of deficits   Problem Solving: Slow processing;Difficulty sequencing;Requires verbal cues;Requires tactile cues;Decreased initiation General Comments: Requires repetition of cues and manual cues at times to follow commands.     Exercises      General Comments General comments (skin integrity, edema, etc.): Family stepped out of room for session.      Pertinent Vitals/Pain Pain Assessment: Faces Faces Pain Scale: Hurts little more Pain Location: chest pain at compression site Pain Descriptors / Indicators: Sore Pain Intervention(s): Monitored during session;Repositioned    Home Living  Prior Function             PT Goals (current goals can now be found in the care plan section) Progress towards PT goals: Progressing toward goals    Frequency  Min 3X/week    PT Plan Current plan remains appropriate    Co-evaluation             End of Session Equipment Utilized During Treatment: Gait belt Activity Tolerance: Patient limited by fatigue;Patient tolerated treatment well Patient left: in chair;with call bell/phone within reach;with family/visitor present     Time: [id number] PT Time Calculation (min) (ACUTE ONLY): 26 min  Charges:  $Gait Training: 8-22 mins $Therapeutic Activity: 8-22 mins                    G Codes:      Havyn Ramo A Jarad Barth 08/31/2015, 10:37 AM  Wray Kearns, Bessie, DPT [phone]

## 2015-08-31 NOTE — Progress Notes (Signed)
SUBJECTIVE: The patient is doing well today.  At this time, she denies chest pain, shortness of breath, or any new concerns.  CURRENT MEDICATIONS: . antiseptic oral rinse  7 mL Mouth Rinse q12n4p  . carvedilol  6.25 mg Oral BID WC  . chlorhexidine gluconate  15 mL Mouth Rinse BID  . heparin  5,000 Units Subcutaneous 3 times per day  . insulin aspart  2-6 Units Subcutaneous 6 times per day  . lisinopril  5 mg Oral Daily  . OXcarbazepine  300 mg Oral BID  . pantoprazole  40 mg Oral Daily  . sodium chloride flush  10-40 mL Intracatheter Q12H  . sodium chloride flush  3 mL Intravenous Q12H  . venlafaxine XR  75 mg Oral Daily      OBJECTIVE: Physical Exam: Filed Vitals:   08/30/15 1049 08/30/15 1153 08/30/15 2001 08/31/15 0122  BP: 131/77 178/67 107/68 127/66  Pulse:  91 72 64  Temp:  98.6 F (37 C) 97.5 F (36.4 C) 97.6 F (36.4 C)  TempSrc:  Oral Oral Oral  Resp:  18 18 18   Height:      Weight:      SpO2:  98% 95% 93%    Intake/Output Summary (Last 24 hours) at 08/31/15 [id number] Last data filed at 08/30/15 2325  Gross per 24 hour  Intake    730 ml  Output   2100 ml  Net  -1370 ml    Telemetry reveals sinus rhythm, occasional PVC's  GEN- The patient is ill appearing, alert and less confused Head- normocephalic, atraumatic Eyes-  Sclera clear, conjunctiva pink Ears- hearing intact Oropharynx- clear Neck- supple Lungs- Clear to ausculation bilaterally, normal work of breathing Heart- Regular rate and rhythm,  GI- soft, NT, ND, + BS Extremities- no clubbing, cyanosis, or edema Skin- no rash or lesion Psych- depressed mood, flat affect Neuro- strength and sensation are intact  LABS: Basic Metabolic Panel:  Recent Labs  08/30/15 0615 08/31/15 0300  NA 139 138  K 3.0* 3.2*  CL 101 102  CO2 26 24  GLUCOSE 118* 86  BUN 50* 57*  CREATININE 1.26* 1.21*  CALCIUM 9.1 9.2  MG 2.1 2.4  PHOS 4.8* 4.1   CBC:  Recent Labs  08/30/15 0615 08/31/15 0300  WBC  13.7* 13.3*  NEUTROABS 11.6* 8.8*  HGB 12.8 12.8  HCT 39.5 39.6  MCV 89.2 91.2  PLT 516* 450*    RADIOLOGY: Dg Chest Port 1 View 08/28/2015  CLINICAL DATA:  Endotracheal tube position EXAM: PORTABLE CHEST 1 VIEW COMPARISON:  08/27/2015 FINDINGS: Endotracheal tube is in good position approximately 3 cm above the carina. NG tube in place with the tip not visualized. Right jugular catheter tip at the cavoatrial junction. No pneumothorax Improvement in bibasilar airspace disease compared with the prior study. There remains left greater than right bibasilar airspace disease. No significant pleural effusion. IMPRESSION: Endotracheal tube now in good position Improved aeration with decrease in bibasilar airspace disease compared with yesterday Electronically Signed   By: Franchot Gallo M.D.   On: 08/28/2015 07:12    ASSESSMENT AND PLAN:  Active Problems:   Cardiac arrest (HCC)   Acute respiratory failure (HCC)   Hypertrophic obstructive cardiomyopathy (HCC)   QT prolongation   Hypokalemia   Essential hypertension   Systolic dysfunction with acute on chronic heart failure ([place])  1. VF/PEA arrest The patient had an OOH resuscitated cardiac arrest presumed to be VF with AED advising shocks. She received immediate bystander  CPR.   Cath this admisssion with no CAD. Echo suggestive of possible nonobstructive hypertrophic CM and EF 40% (possibly due to myocardial stunning). She is making improvements.    No further arrhythmias noted.  Keep K >3.9, Mg >1.8 Avoid QT prolonging medications Cardiac MRI today (results currently pending) Will need outpatient genetic testing  2. Acute systolic dysfunction Likely due to myocardial stunning with cardiac arrest Cardiac MRI today  Continue Coreg, ACE-I titration over the weekend -12L this admission Stop lasix and encourage PO hydration  3. Psych Appreciate input Need to avoid QT prolonging drugs   4. Hypertension Stable No change required  today  5.  Hypokalemia Will supplement today   Chanetta Marshall, NP 08/31/2015 6:35 AM  I have seen, examined the patient, and reviewed the above assessment and plan.  On exam, RRR.  Changes to above are made where necessary.   Given structural abnormality seen on echo, I would advise ICD implantation. Risks, benefits, alternatives to ICD implantation were discussed in detail with the patient today. The patient  understands that the risks include but are not limited to bleeding, infection, pneumothorax, perforation, tamponade, vascular damage, renal failure, MI, stroke, death, inappropriate shocks, and lead dislodgement and wishes to proceed.  I have tentatively placed her on the schedule for Monday.  Co Sign: Thompson Grayer, MD 08/31/2015 4:10 PM

## 2015-08-31 NOTE — Evaluation (Signed)
Occupational Therapy Evaluation Patient Details Name: Marie Ramos MRN: [id number] DOB: 1956-06-28 Today's Date: 08/31/2015    History of Present Illness 60 year old with history of bipolar disorder. Admitted after a witnessed cardiac arrest, intubated initially, emergently taken for heart cath and placed on hypothermic protocol. Encepholapthy noted.   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  Presents with chest pain at site of compressions, impaired cognition, generalized weakness and decreased balance interfering with ability to perform ADL and mobility.  Will need intensive rehab prior to return home with her supportive family. Will follow acutely.     Follow Up Recommendations  CIR;Supervision/Assistance - 24 hour    Equipment Recommendations  3 in 1 bedside comode    Recommendations for Other Services       Precautions / Restrictions Precautions Precautions: Fall Restrictions Weight Bearing Restrictions: No      Mobility Bed Mobility      General bed mobility comments: pt in chair  Transfers Overall transfer level: Needs assistance Equipment used: Rolling walker (2 wheeled);None Transfers: Sit to/from [employer] to Stand: Min assist Stand pivot transfers: Mod assist       General transfer comment: assist to rise and for UE support for balance    Balance Overall balance assessment: Needs assistance Sitting-balance support: Feet supported;No upper extremity supported Sitting balance-Leahy Scale: Fair Sitting balance - Comments: Fatigues easily in trunk musculature resulting in need for back support.   Standing balance support: During functional activity Standing balance-Leahy Scale: Poor Standing balance comment: Requires B UEs for balance                            ADL Overall ADL's : Needs assistance/impaired Eating/Feeding:  Minimal assistance;Sitting Eating/Feeding Details (indicate cue type and reason): assist to set up tray, cut food, open containers Grooming: Wash/dry hands;Sitting;Minimal assistance Grooming Details (indicate cue type and reason): decreased task persistence Upper Body Bathing: Maximal assistance;Sitting   Lower Body Bathing: Maximal assistance;Sit to/from stand   Upper Body Dressing : Sitting;Moderate assistance   Lower Body Dressing: Maximal assistance;Sit to/from stand   Toilet Transfer: Moderate assistance;BSC;Stand-pivot   Toileting- Water quality scientist and Hygiene: Sit to/from stand;Moderate assistance               Vision     Perception     Praxis      Pertinent Vitals/Pain Pain Assessment: Faces Faces Pain Scale: Hurts little more Pain Location: chest at compression site Pain Descriptors / Indicators: Grimacing;Guarding Pain Intervention(s): Limited activity within patient's tolerance;Monitored during session;Repositioned     Hand Dominance Right   Extremity/Trunk Assessment Upper Extremity Assessment Upper Extremity Assessment: Generalized weakness (impaired coordination )   Lower Extremity Assessment Lower Extremity Assessment: Defer to PT evaluation       Communication Communication Communication: No difficulties   Cognition Arousal/Alertness: Awake/alert Behavior During Therapy: Impulsive Overall Cognitive Status: Impaired/Different from baseline Area of Impairment: Orientation;Memory;Following commands;Safety/judgement;Attention Orientation Level: Disoriented to;Situation;Time Current Attention Level: Focused Memory: Decreased short-term memory Following Commands: Follows one step commands with increased time Safety/Judgement: Decreased awareness of safety;Decreased awareness of deficits   Problem Solving: Slow processing;Difficulty sequencing;Requires verbal cues;Requires tactile cues;Decreased initiation General Comments: Requires  repetition of cues and manual cues at times to follow commands.    General Comments       Exercises       Shoulder Instructions  Home Living Family/patient expects to be discharged to:: Private residence Living Arrangements: Spouse/significant other Available Help at Discharge: Family Type of Home: House Home Access: Level entry     Home Layout: Two level Alternate Level Stairs-Number of Steps: 12 Alternate Level Stairs-Rails: Can reach both           Home Equipment: None   Additional Comments: pt unable to recall bathroom set up, no family in room, pt is a Oceanographer and likes to bake      Prior Functioning/Environment Level of Independence: Independent             OT Diagnosis: Generalized weakness;Cognitive deficits;Acute pain   OT Problem List: Decreased strength;Decreased activity tolerance;Impaired balance (sitting and/or standing);Decreased coordination;Decreased cognition;Decreased safety awareness;Decreased knowledge of use of DME or AE;Obesity;Pain;Impaired UE functional use   OT Treatment/Interventions: Self-care/ADL training;DME and/or AE instruction;Therapeutic activities;Patient/family education;Balance training;Cognitive remediation/compensation    OT Goals(Current goals can be found in the care plan section) Acute Rehab OT Goals Patient Stated Goal: eat lunch OT Goal Formulation: With patient Time For Goal Achievement: 09/14/15 Potential to Achieve Goals: Good ADL Goals Pt Will Perform Grooming: with supervision;standing Pt Will Perform Upper Body Bathing: with supervision;sitting Pt Will Perform Lower Body Bathing: with supervision;sit to/from stand Pt Will Perform Upper Body Dressing: with supervision;sitting Pt Will Perform Lower Body Dressing: with supervision;sit to/from stand Pt Will Transfer to Toilet: with supervision;ambulating Pt Will Perform Toileting - Clothing Manipulation and hygiene: with supervision;sit to/from  stand Additional ADL Goal #1: Pt demonstrate selective attention during ADL. Additional ADL Goal #2: Pt will identify necessary items to complete ADL and gather with supervision and RW.  OT Frequency: Min 2X/week   Barriers to D/C:            Co-evaluation              End of Session Equipment Utilized During Treatment: Gait belt  Activity Tolerance: Patient limited by fatigue Patient left: in chair;with call bell/phone within reach   Time: 1222-1239 OT Time Calculation (min): 17 min Charges:  OT General Charges $OT Visit: 1 Procedure OT Evaluation $OT Eval Moderate Complexity: 1 Procedure G-Codes:    Malka So 08/31/2015, 1:27 PM  [phone]

## 2015-08-31 NOTE — Progress Notes (Signed)
PROGRESS NOTE  MARLYSS CALDERONE [id number] DOB: 1956-02-09 DOA: 08/24/2015 PCP: No primary care provider on file.  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 Bro is a 60 year old with history of bipolar disorder. Admitted after a witnessed cardiac arrest. The initial rhythm was likely A. fib, V. tach as the AED delivered shocks. She then went to PEA upon EMS arrival and pulse was recovered after Epi injection. later patient's rhythm converted into SVT. She was shocked and was converted to NSR at 360 joulesTime to ROSC is estimated at around 10 minutes. Patient arrived to the ER on 2/17 unresponsive. She was found to become progressively dyspnea, initial abg showed P/F ration of 193, co2 retention and ph 7.2, patient was intubated in the ED , stat icu and cardiology consult and admitted to icu. Hypotermia protocol initiated in the ED  Patient had been reportedly increasing her psychiatric medications to deal with her bipolar disease. She has prolonged QTC at 530. Her cardiac arrest may be secondary to arrhythmia related to qTC prolongation  HPI/Recap of past 24 hours:  Sitting in chair, denies pain , occasional cough noticed, reported no bm for several days, want to have a shower, mild confusion, impaired memory, family in room  Assessment/Plan: Active Problems:   Cardiac arrest ([place])   Acute respiratory failure ([place])   Hypertrophic obstructive cardiomyopathy (HCC)   QT prolongation   Hypokalemia   Essential hypertension   Systolic dysfunction with acute on chronic heart failure ([place])  Witnessed out of hospital cardiac arrest: with shockable rhythm, ROSC after 62mins of cpr, urgent left heart cath no LAD. Cardiac arrest thought due to prolonged QTc from psych meds. Psych meds adjusted , EP following.  Acute respiratory failure due to  cardiac arrest required intubation from 2/17 to 2/21. Now on room air.  Mild cognitive impairment, will get mri brian to evaluate anoxic brain injury.   Bipolar: psych input appreciated, meds changed to trileptal and continue effexor  Hypokalemia, replace k  Acute kidney injury: cr normal at baseline, today 1.21, adequate urine output, renal dosing meds, will get ua.  HTN: on coreg/lisinopril  Dilated hypertensive cardiomyopathy/systolic and diastolic chf: currently euvolemic. Close monitor volume status. Patient received lasix while on vent.  Obsesity; life style modification  Deconditioning: CIR   Code Status: full  Family Communication: patient and family  Disposition Plan: CIR   Consultants:  Cardiology/EP  Critical care admit, transfer to [facility] on 2/24  Psychiatry  CIR(inpatient rehab)  neurology  Procedures:  Intubation 2/17/extubation 2/21 Stat echo , stat cardiac cath showed Widely patent coronary arteries  Normal LVEDP  EEG on 2/17: This is an abnormal routine inpatient EEG, suggestive of encephalopathy, likely medication effect as patient is on sedation, although cannot exclude hypoxic ischemic encephalopathy based on the clinical history of post cardiac arrest. No evidence of electrographic seizures were seen. If clinically indicated, consider repeating the EEG when off of sedation. Clinical correlation is recommended .   Right IJ placed on 2/17  Hypothermia protocol from 2/17 to 2/19  Tube feed while on vent  Antibiotics:  *   Objective: BP 120/76 mmHg  Pulse 65  Temp(Src) 98.7 F (37.1 C) (Oral)  Resp 20  Ht 5\' 7"  (1.702 m)  Wt 98.385 kg (216 lb 14.4 oz)  BMI 33.96 kg/m2  SpO2 95%  Intake/Output Summary (Last  24 hours) at 08/31/15 1528 Last data filed at 08/31/15 1322  Gross per 24 hour  Intake    700 ml  Output   1850 ml  Net  -1150 ml   Filed Weights   08/29/15 0418 08/30/15 0410 08/31/15 0500  Weight: 98 kg (216 lb 0.8 oz)  98.431 kg (217 lb) 98.385 kg (216 lb 14.4 oz)    Exam:   General:  NAD, sitting in chair  Cardiovascular: RRR  Respiratory: CTABL  Abdomen: Soft/ND/NT, positive BS  Musculoskeletal: No Edema  Neuro: mild cognitive impairment, no focal deficit  Data Reviewed: Basic Metabolic Panel:  Recent Labs Lab 08/26/15 0801 08/27/15 0344 08/27/15 0856 08/28/15 0445 08/29/15 0515 08/30/15 0615 08/31/15 0300  NA 141 145  --   --  145 139 138  K 4.2 3.8  --   --  3.2* 3.0* 3.2*  CL 114* 120*  --   --  100* 101 102  CO2 19* 20*  --   --  27 26 24   GLUCOSE 107* 107*  --   --  134* 118* 86  BUN 16 29*  --   --  39* 50* 57*  CREATININE 0.85 1.14*  --   --  1.21* 1.26* 1.21*  CALCIUM 7.7* 8.5*  --   --  9.2 9.1 9.2  MG  --   --  1.9 1.8 2.1 2.1 2.4  PHOS  --   --  2.2* 2.3* 4.7* 4.8* 4.1   Liver Function Tests: No results for input(s): AST, ALT, ALKPHOS, BILITOT, PROT, ALBUMIN in the last 168 hours. No results for input(s): LIPASE, AMYLASE in the last 168 hours. No results for input(s): AMMONIA in the last 168 hours. CBC:  Recent Labs Lab 08/26/15 0400 08/27/15 0344 08/29/15 0515 08/30/15 0615 08/31/15 0300  WBC 11.2* 10.5 16.7* 13.7* 13.3*  NEUTROABS  --   --  15.0* 11.6* 8.8*  HGB 11.6* 10.4* 12.3 12.8 12.8  HCT 37.6 33.8* 38.4 39.5 39.6  MCV 92.6 92.9 90.6 89.2 91.2  PLT 496* 436* 473* 516* 450*   Cardiac Enzymes:    Recent Labs Lab 08/24/15 1650 08/25/15 0050 08/25/15 0410 08/25/15 1130 08/25/15 1645  TROPONINI 1.16* 0.86* 0.65* 0.36* 0.24*   BNP (last 3 results) No results for input(s): BNP in the last 8760 hours.  ProBNP (last 3 results) No results for input(s): PROBNP in the last 8760 hours.  CBG:  Recent Labs Lab 08/30/15 1609 08/30/15 1949 08/31/15 0119 08/31/15 0628 08/31/15 1058  GLUCAP 121* 186* 204* 149* 111*    Recent Results (from the past 240 hour(s))  Culture, respiratory (NON-Expectorated)     Status: None   Collection Time:  08/24/15  5:08 PM  Result Value Ref Range Status   Specimen Description TRACHEAL ASPIRATE  Final   Special Requests NONE  Final   Gram Stain   Final    ABUNDANT WBC PRESENT,BOTH PMN AND MONONUCLEAR FEW SQUAMOUS EPITHELIAL CELLS PRESENT ABUNDANT GRAM POSITIVE COCCI IN PAIRS IN CHAINS IN CLUSTERS FEW GRAM NEGATIVE RODS Performed at [facility]    Culture   Final    NORMAL OROPHARYNGEAL FLORA Performed at [facility]    Report Status 08/27/2015 FINAL  Final  MRSA PCR Screening     Status: None   Collection Time: 08/25/15 12:31 PM  Result Value Ref Range Status   MRSA by PCR NEGATIVE NEGATIVE Final    Comment:        The GeneXpert MRSA Assay (  FDA approved for NASAL specimens only), is one component of a comprehensive MRSA colonization surveillance program. It is not intended to diagnose MRSA infection nor to guide or monitor treatment for MRSA infections.      Studies: No results found.  Scheduled Meds: . antiseptic oral rinse  7 mL Mouth Rinse q12n4p  . carvedilol  6.25 mg Oral BID WC  . chlorhexidine gluconate  15 mL Mouth Rinse BID  . dextromethorphan-guaiFENesin  1 tablet Oral BID  . heparin  5,000 Units Subcutaneous 3 times per day  . insulin aspart  2-6 Units Subcutaneous 6 times per day  . lisinopril  5 mg Oral Daily  . OXcarbazepine  300 mg Oral BID  . pantoprazole  40 mg Oral Daily  . potassium chloride  40 mEq Oral BID  . sodium chloride flush  10-40 mL Intracatheter Q12H  . sodium chloride flush  3 mL Intravenous Q12H  . venlafaxine XR  75 mg Oral Daily    Continuous Infusions:    Time spent: 22mins  Shaman Muscarella MD, PhD  [facility] Pager [phone]. If 7PM-7AM, please contact night-coverage at [email or web address], password [facility] 08/31/2015, 3:28 PM  LOS: 7 days

## 2015-09-01 ENCOUNTER — Inpatient Hospital Stay (HOSPITAL_COMMUNITY): Payer: BLUE CROSS/BLUE SHIELD

## 2015-09-01 DIAGNOSIS — R059 Cough, unspecified: Secondary | ICD-10-CM | POA: Diagnosis present

## 2015-09-01 DIAGNOSIS — R05 Cough: Secondary | ICD-10-CM

## 2015-09-01 DIAGNOSIS — G931 Anoxic brain damage, not elsewhere classified: Secondary | ICD-10-CM | POA: Diagnosis present

## 2015-09-01 LAB — BASIC METABOLIC PANEL
Anion gap: 6 (ref 5–15)
BUN: 37 mg/dL — AB (ref 6–20)
CHLORIDE: 108 mmol/L (ref 101–111)
CO2: 25 mmol/L (ref 22–32)
Calcium: 8.9 mg/dL (ref 8.9–10.3)
Creatinine, Ser: 1.04 mg/dL — ABNORMAL HIGH (ref 0.44–1.00)
GFR calc Af Amer: >60 mL/min (ref >60)
GFR calc non Af Amer: 57 mL/min — ABNORMAL LOW (ref >60)
Glucose, Bld: 96 mg/dL (ref 65–99)
POTASSIUM: 4.3 mmol/L (ref 3.5–5.1)
SODIUM: 139 mmol/L (ref 135–145)

## 2015-09-01 LAB — CBC WITH DIFFERENTIAL/PLATELET
Basophils Absolute: 0 10*3/uL (ref 0.0–0.1)
Basophils Relative: 0 %
EOS ABS: 0.1 10*3/uL (ref 0.0–0.7)
Eosinophils Relative: 1 %
HEMATOCRIT: 39.9 % (ref 36.0–46.0)
HEMOGLOBIN: 12.9 g/dL (ref 12.0–15.0)
LYMPHS ABS: 2.4 10*3/uL (ref 0.7–4.0)
LYMPHS PCT: 19 %
MCH: 29.5 pg (ref 26.0–34.0)
MCHC: 32.3 g/dL (ref 30.0–36.0)
MCV: 91.1 fL (ref 78.0–100.0)
MONOS PCT: 14 %
Monocytes Absolute: 1.7 10*3/uL — ABNORMAL HIGH (ref 0.1–1.0)
NEUTROS ABS: 8.5 10*3/uL — AB (ref 1.7–7.7)
NEUTROS PCT: 66 %
Platelets: 489 10*3/uL — ABNORMAL HIGH (ref 150–400)
RBC: 4.38 MIL/uL (ref 3.87–5.11)
RDW: 15.3 % (ref 11.5–15.5)
WBC: 12.8 10*3/uL — AB (ref 4.0–10.5)

## 2015-09-01 LAB — URINALYSIS, ROUTINE W REFLEX MICROSCOPIC
Bilirubin Urine: NEGATIVE
GLUCOSE, UA: NEGATIVE mg/dL
Ketones, ur: NEGATIVE mg/dL
Nitrite: NEGATIVE
PH: 6 (ref 5.0–8.0)
PROTEIN: NEGATIVE mg/dL
Specific Gravity, Urine: 1.015 (ref 1.005–1.030)

## 2015-09-01 LAB — URINE MICROSCOPIC-ADD ON

## 2015-09-01 LAB — GLUCOSE, CAPILLARY
GLUCOSE-CAPILLARY: 85 mg/dL (ref 65–99)
GLUCOSE-CAPILLARY: 90 mg/dL (ref 65–99)
GLUCOSE-CAPILLARY: 102 mg/dL — AB (ref 65–99)
Glucose-Capillary: 64 mg/dL — ABNORMAL LOW (ref 65–99)

## 2015-09-01 MED ORDER — BENZONATATE 100 MG PO CAPS
100.0000 mg | ORAL_CAPSULE | Freq: Three times a day (TID) | ORAL | Status: DC | PRN
  Administered 2015-09-01 – 2015-09-05 (×9): 100 mg via ORAL
  Filled 2015-09-01 (×10): qty 1

## 2015-09-01 NOTE — Progress Notes (Signed)
Pt walked 1109ft. tolerated well

## 2015-09-01 NOTE — Progress Notes (Addendum)
PROGRESS NOTE  Marie Ramos [id number] DOB: Jun 26, 1956 DOA: 08/24/2015 PCP: No primary care provider on file.  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 Admitted after a witnessed cardiac arrest. The initial rhythm was likely A. fib, V. tach as the AED delivered shocks. She then went to PEA upon EMS arrival and pulse was recovered after Epi injection. later patient's rhythm converted into SVT. She was shocked and was converted to NSR at 360 joulesTime to ROSC is estimated at around 10 minutes. Patient arrived to the ER on 2/17 unresponsive. She was found to become progressively dyspnea, initial abg showed P/F ration of 193, co2 retention and ph 7.2, patient was intubated in the ED , stat icu and cardiology consult and admitted to icu. Hypotermia protocol initiated in the ED  Patient had been reportedly increasing her psychiatric medications to deal with her bipolar disease. She has prolonged QTC at 530. Her cardiac arrest may be secondary to arrhythmia related to qTC prolongation  HPI/Recap of past 24 hours:  Sitting in chair, denies pain , occasional cough noticed, improved cognition, family in room  Assessment/Plan: Active Problems:   Cardiac arrest ([place])   Acute respiratory failure ([place])   Hypertrophic obstructive cardiomyopathy (HCC)   QT prolongation   Hypokalemia   Essential hypertension   Systolic dysfunction with acute on chronic heart failure ([place])  Witnessed out of hospital cardiac arrest: with shockable rhythm, ROSC after 40mins of cpr, urgent left heart cath no LAD. Cardiac arrest thought due to prolonged QTc from psych meds. Psych meds adjusted , EP following. Plan to place ICD on Monday for secondary prevention of VF arrest. Will need to make npo and stop heparin  prophylasix  Acute respiratory failure due to cardiac arrest required intubation from 2/17 to 2/21. Now on room air.  Mild cognitive impairment, will get mri brian to evaluate anoxic brain injury.    Bipolar: psych input appreciated, meds changed to trileptal and continue effexor, family want update from psych in the next few days  Hypokalemia, replace k  Acute kidney injury: cr normal at baseline, today 1.21, adequate urine output, renal dosing meds, will get ua.  HTN: on coreg/lisinopril  Dilated hypertensive cardiomyopathy/systolic and diastolic chf: currently euvolemic. Close monitor volume status. Patient received lasix while on vent.  Cough: lung exam clear, no edema, no fever, no leukocytosis, will get cxr 2 view, start incentive spirometer, mucinex. Patient is newly started on lisinopril, side effect from lisinopril? May need lasix pending x ray result, echo lvef [id number], grade i diastolic dysfunction.  Obsesity; life style modification  Deconditioning: CIR   Code Status: full  Family Communication: patient and family  Disposition Plan: CIR   Consultants:  Cardiology/EP  Critical care admit, transfer to [facility] on 2/24  Psychiatry  CIR(inpatient rehab)  neurology  Procedures:  Intubation 2/17/extubation 2/21 Stat echo , stat cardiac cath showed Widely patent coronary arteries  Normal LVEDP  EEG on 2/17: This is an abnormal routine inpatient EEG, suggestive of encephalopathy, likely medication effect as patient is on sedation, although cannot exclude hypoxic ischemic encephalopathy based on the clinical history of post cardiac arrest. No evidence of electrographic seizures were seen. If clinically indicated, consider repeating the EEG when off of sedation. Clinical correlation is recommended .   Right IJ placed  on 2/17  Hypothermia protocol from 2/17 to 2/19  Tube feed while on vent  ICD placement on 2/27  Antibiotics:  none   Objective: BP 157/86 mmHg   Pulse 58  Temp(Src) 97.5 F (36.4 C) (Oral)  Resp 18  Ht 5\' 7"  (1.702 m)  Wt 100.653 kg (221 lb 14.4 oz)  BMI 34.75 kg/m2  SpO2 94%  Intake/Output Summary (Last 24 hours) at 09/01/15 1411 Last data filed at 09/01/15 0449  Gross per 24 hour  Intake    240 ml  Output    600 ml  Net   -360 ml   Filed Weights   08/30/15 0410 08/31/15 0500 09/01/15 0448  Weight: 98.431 kg (217 lb) 98.385 kg (216 lb 14.4 oz) 100.653 kg (221 lb 14.4 oz)    Exam:   General:  NAD, sitting in chair  Cardiovascular: RRR  Respiratory: CTABL  Abdomen: Soft/ND/NT, positive BS  Musculoskeletal: No Edema  Neuro: mild cognitive impairment, no focal deficit, aaox3, still poor short term memory  Data Reviewed: Basic Metabolic Panel:  Recent Labs Lab 08/27/15 0344 08/27/15 0856 08/28/15 0445 08/29/15 0515 08/30/15 0615 08/31/15 0300 09/01/15 0427  NA 145  --   --  145 139 138 139  K 3.8  --   --  3.2* 3.0* 3.2* 4.3  CL 120*  --   --  100* 101 102 108  CO2 20*  --   --  27 26 24 25   GLUCOSE 107*  --   --  134* 118* 86 96  BUN 29*  --   --  39* 50* 57* 37*  CREATININE 1.14*  --   --  1.21* 1.26* 1.21* 1.04*  CALCIUM 8.5*  --   --  9.2 9.1 9.2 8.9  MG  --  1.9 1.8 2.1 2.1 2.4  --   PHOS  --  2.2* 2.3* 4.7* 4.8* 4.1  --    Liver Function Tests: No results for input(s): AST, ALT, ALKPHOS, BILITOT, PROT, ALBUMIN in the last 168 hours. No results for input(s): LIPASE, AMYLASE in the last 168 hours. No results for input(s): AMMONIA in the last 168 hours. CBC:  Recent Labs Lab 08/27/15 0344 08/29/15 0515 08/30/15 0615 08/31/15 0300 09/01/15 0427  WBC 10.5 16.7* 13.7* 13.3* 12.8*  NEUTROABS  --  15.0* 11.6* 8.8* 8.5*  HGB 10.4* 12.3 12.8 12.8 12.9  HCT 33.8* 38.4 39.5 39.6 39.9  MCV 92.9 90.6 89.2 91.2 91.1  PLT 436* 473* 516* 450* 489*   Cardiac Enzymes:    Recent Labs Lab 08/25/15 1645  TROPONINI 0.24*   BNP (last 3 results) No results for input(s): BNP in the last 8760  hours.  ProBNP (last 3 results) No results for input(s): PROBNP in the last 8760 hours.  CBG:  Recent Labs Lab 08/31/15 1058 08/31/15 1613 08/31/15 2049 09/01/15 0618 09/01/15 1115  GLUCAP 111* 159* 110* 85 90    Recent Results (from the past 240 hour(s))  Culture, respiratory (NON-Expectorated)     Status: None   Collection Time: 08/24/15  5:08 PM  Result Value Ref Range Status   Specimen Description TRACHEAL ASPIRATE  Final   Special Requests NONE  Final   Gram Stain   Final    ABUNDANT WBC PRESENT,BOTH PMN AND MONONUCLEAR FEW SQUAMOUS EPITHELIAL CELLS PRESENT ABUNDANT GRAM POSITIVE COCCI IN PAIRS IN CHAINS IN CLUSTERS FEW GRAM NEGATIVE RODS Performed at [facility]    Culture   Final    NORMAL  OROPHARYNGEAL FLORA Performed at [facility]    Report Status 08/27/2015 FINAL  Final  MRSA PCR Screening     Status: None   Collection Time: 08/25/15 12:31 PM  Result Value Ref Range Status   MRSA by PCR NEGATIVE NEGATIVE Final    Comment:        The GeneXpert MRSA Assay (FDA approved for NASAL specimens only), is one component of a comprehensive MRSA colonization surveillance program. It is not intended to diagnose MRSA infection nor to guide or monitor treatment for MRSA infections.      Studies: No results found.  Scheduled Meds: . antiseptic oral rinse  7 mL Mouth Rinse q12n4p  . carvedilol  6.25 mg Oral BID WC  . chlorhexidine gluconate  15 mL Mouth Rinse BID  . dextromethorphan-guaiFENesin  1 tablet Oral BID  . heparin  5,000 Units Subcutaneous 3 times per day  . insulin aspart  2-6 Units Subcutaneous 6 times per day  . lisinopril  10 mg Oral Daily  . OXcarbazepine  300 mg Oral BID  . pantoprazole  40 mg Oral Daily  . senna-docusate  2 tablet Oral BID  . sodium chloride flush  10-40 mL Intracatheter Q12H  . sodium chloride flush  3 mL Intravenous Q12H  . venlafaxine XR  75 mg Oral Daily    Continuous Infusions:    Time spent:  86mins  Laurana Magistro MD, PhD  [facility] Pager [phone]. If 7PM-7AM, please contact night-coverage at [email or web address], password [email or web address] 09/01/2015, 2:11 PM  LOS: 8 days

## 2015-09-01 NOTE — Progress Notes (Signed)
SUBJECTIVE: The patient is doing well today.  At this time, she denies chest pain, shortness of breath, or any new concerns.  CURRENT MEDICATIONS: . antiseptic oral rinse  7 mL Mouth Rinse q12n4p  . carvedilol  6.25 mg Oral BID WC  . chlorhexidine gluconate  15 mL Mouth Rinse BID  . dextromethorphan-guaiFENesin  1 tablet Oral BID  . heparin  5,000 Units Subcutaneous 3 times per day  . insulin aspart  2-6 Units Subcutaneous 6 times per day  . lisinopril  10 mg Oral Daily  . OXcarbazepine  300 mg Oral BID  . pantoprazole  40 mg Oral Daily  . senna-docusate  2 tablet Oral BID  . sodium chloride flush  10-40 mL Intracatheter Q12H  . sodium chloride flush  3 mL Intravenous Q12H  . venlafaxine XR  75 mg Oral Daily      OBJECTIVE: Physical Exam: Filed Vitals:   08/31/15 0500 08/31/15 1455 08/31/15 1944 09/01/15 0448  BP:  120/76 118/76 157/86  Pulse:  65 59 58  Temp:  98.7 F (37.1 C) 98 F (36.7 C) 97.5 F (36.4 C)  TempSrc:  Oral Oral Oral  Resp:  20 18 18   Height:      Weight: 216 lb 14.4 oz (98.385 kg)   221 lb 14.4 oz (100.653 kg)  SpO2:  95% 98% 94%    Intake/Output Summary (Last 24 hours) at 09/01/15 [id number] Last data filed at 09/01/15 0449  Gross per 24 hour  Intake    480 ml  Output    600 ml  Net   -120 ml    Telemetry reveals sinus rhythm, occasional PVC's  GEN- The patient is less ill appearing, alert and oriented Head- normocephalic, atraumatic Eyes-  Sclera clear, conjunctiva pink Ears- hearing intact Oropharynx- clear Neck- supple Lungs- Clear to ausculation bilaterally, normal work of breathing Heart- Regular rate and rhythm,  GI- soft, NT, ND, + BS Extremities- no clubbing, cyanosis, or edema Skin- no rash or lesion Psych- depressed mood, flat affect Neuro- strength and sensation are intact  LABS: Basic Metabolic Panel:  Recent Labs  08/30/15 0615 08/31/15 0300 09/01/15 0427  NA 139 138 139  K 3.0* 3.2* 4.3  CL 101 102 108  CO2 26 24  25   GLUCOSE 118* 86 96  BUN 50* 57* 37*  CREATININE 1.26* 1.21* 1.04*  CALCIUM 9.1 9.2 8.9  MG 2.1 2.4  --   PHOS 4.8* 4.1  --    CBC:  Recent Labs  08/31/15 0300 09/01/15 0427  WBC 13.3* 12.8*  NEUTROABS 8.8* 8.5*  HGB 12.8 12.9  HCT 39.6 39.9  MCV 91.2 91.1  PLT 450* 489*    RADIOLOGY: Dg Chest Port 1 View 08/28/2015  CLINICAL DATA:  Endotracheal tube position EXAM: PORTABLE CHEST 1 VIEW COMPARISON:  08/27/2015 FINDINGS: Endotracheal tube is in good position approximately 3 cm above the carina. NG tube in place with the tip not visualized. Right jugular catheter tip at the cavoatrial junction. No pneumothorax Improvement in bibasilar airspace disease compared with the prior study. There remains left greater than right bibasilar airspace disease. No significant pleural effusion. IMPRESSION: Endotracheal tube now in good position Improved aeration with decrease in bibasilar airspace disease compared with yesterday Electronically Signed   By: Franchot Gallo M.D.   On: 08/28/2015 07:12    ASSESSMENT AND PLAN:  Active Problems:   Cardiac arrest ([place])   Acute respiratory failure (HCC)   Hypertrophic obstructive cardiomyopathy ([place])  QT prolongation   Hypokalemia   Essential hypertension   Systolic dysfunction with acute on chronic heart failure ([place])  1. VF/PEA arrest The patient had an OOH resuscitated cardiac arrest presumed to be VF with AED advising shocks. She received immediate bystander CPR. Cath this admisssion with no CAD. Echo suggestive of possible nonobstructive hypertrophic CM and EF 40% (possibly due to myocardial stunning). MRI reveals hypertensive rather than hypertrophic CM with EF 45% Keep K >3.9, Mg >1.8 Avoid QT prolonging medications Repeat ekg at this time to assess QT now that electrolytes are improved, psych medicines are washed out, and she has been fully rewarmed for severa days Given structural heart disease and VF arrest, I would advise ICD  implantation for secondary prevention.  Risks, benefits, alternatives to ICD implantation were discussed in detail with the patient today. The patient  understands that the risks include but are not limited to bleeding, infection, pneumothorax, perforation, tamponade, vascular damage, renal failure, MI, stroke, death, inappropriate shocks, and lead dislodgement and wishes to proceed.  I have tentatively placed her on the schedule for Monday.    * Please stop heparin prophylaxis on Sunday and make NPO after midnight Sunday night.  2. Acute systolic dysfunction Cardiac MRI suggests hypertensive CM Continue Coreg, ACE-I titration over the weekend if HR/BP allow Remains dry Oral hydration encouraged  3. Psych Appreciate input Need to avoid QT prolonging drugs   4. Hypertension Stable No change required today   Thompson Grayer, MD 09/01/2015 8:03 AM

## 2015-09-02 ENCOUNTER — Inpatient Hospital Stay (HOSPITAL_COMMUNITY): Payer: BLUE CROSS/BLUE SHIELD

## 2015-09-02 DIAGNOSIS — J9602 Acute respiratory failure with hypercapnia: Secondary | ICD-10-CM

## 2015-09-02 DIAGNOSIS — I6340 Cerebral infarction due to embolism of unspecified cerebral artery: Secondary | ICD-10-CM | POA: Diagnosis present

## 2015-09-02 LAB — COMPREHENSIVE METABOLIC PANEL
ALBUMIN: 2.5 g/dL — AB (ref 3.5–5.0)
ALK PHOS: 94 U/L (ref 38–126)
ALT: 42 U/L (ref 14–54)
AST: 26 U/L (ref 15–41)
Anion gap: 12 (ref 5–15)
BILIRUBIN TOTAL: 0.3 mg/dL (ref 0.3–1.2)
BUN: 32 mg/dL — AB (ref 6–20)
CALCIUM: 9.1 mg/dL (ref 8.9–10.3)
CO2: 26 mmol/L (ref 22–32)
CREATININE: 1.1 mg/dL — AB (ref 0.44–1.00)
Chloride: 105 mmol/L (ref 101–111)
GFR calc Af Amer: >60 mL/min (ref >60)
GFR, EST NON AFRICAN AMERICAN: 53 mL/min — AB (ref >60)
GLUCOSE: 101 mg/dL — AB (ref 65–99)
Potassium: 4.5 mmol/L (ref 3.5–5.1)
Sodium: 143 mmol/L (ref 135–145)
TOTAL PROTEIN: 5.7 g/dL — AB (ref 6.5–8.1)

## 2015-09-02 LAB — CBC
HEMATOCRIT: 37.9 % (ref 36.0–46.0)
HEMOGLOBIN: 11.8 g/dL — AB (ref 12.0–15.0)
MCH: 28.6 pg (ref 26.0–34.0)
MCHC: 31.1 g/dL (ref 30.0–36.0)
MCV: 92 fL (ref 78.0–100.0)
Platelets: 516 10*3/uL — ABNORMAL HIGH (ref 150–400)
RBC: 4.12 MIL/uL (ref 3.87–5.11)
RDW: 15.6 % — AB (ref 11.5–15.5)
WBC: 13.6 10*3/uL — AB (ref 4.0–10.5)

## 2015-09-02 LAB — GLUCOSE, CAPILLARY
GLUCOSE-CAPILLARY: 114 mg/dL — AB (ref 65–99)
Glucose-Capillary: 88 mg/dL (ref 65–99)
Glucose-Capillary: 123 mg/dL — ABNORMAL HIGH (ref 65–99)
Glucose-Capillary: 99 mg/dL (ref 65–99)

## 2015-09-02 LAB — TSH: TSH: 1.952 u[IU]/mL (ref 0.350–4.500)

## 2015-09-02 MED ORDER — LISINOPRIL 10 MG PO TABS
20.0000 mg | ORAL_TABLET | Freq: Every day | ORAL | Status: DC
  Administered 2015-09-02 – 2015-09-04 (×3): 20 mg via ORAL
  Filled 2015-09-02 (×3): qty 2

## 2015-09-02 NOTE — Progress Notes (Signed)
SUBJECTIVE: The patient is doing well today.  At this time, she denies chest pain, shortness of breath, or any new concerns.  CURRENT MEDICATIONS: . antiseptic oral rinse  7 mL Mouth Rinse q12n4p  . carvedilol  6.25 mg Oral BID WC  . chlorhexidine gluconate  15 mL Mouth Rinse BID  . dextromethorphan-guaiFENesin  1 tablet Oral BID  . heparin  5,000 Units Subcutaneous 3 times per day  . insulin aspart  2-6 Units Subcutaneous 6 times per day  . lisinopril  10 mg Oral Daily  . OXcarbazepine  300 mg Oral BID  . pantoprazole  40 mg Oral Daily  . senna-docusate  2 tablet Oral BID  . sodium chloride flush  10-40 mL Intracatheter Q12H  . sodium chloride flush  3 mL Intravenous Q12H  . venlafaxine XR  75 mg Oral Daily      OBJECTIVE: Physical Exam: Filed Vitals:   09/01/15 0448 09/01/15 1509 09/01/15 1958 09/02/15 0419  BP: 157/86 119/73 138/72 160/93  Pulse: 58 62 62 58  Temp: 97.5 F (36.4 C) 98.5 F (36.9 C) 98.6 F (37 C) 97.4 F (36.3 C)  TempSrc: Oral Oral Oral Oral  Resp: 18 18 18 18   Height:      Weight: 221 lb 14.4 oz (100.653 kg)   221 lb 3.2 oz (100.336 kg)  SpO2: 94% 97% 95% 98%    Intake/Output Summary (Last 24 hours) at 09/02/15 [id number] Last data filed at 09/02/15 0420  Gross per 24 hour  Intake    240 ml  Output   1050 ml  Net   -810 ml    Telemetry reveals sinus rhythm, occasional PVC's  GEN- The patient is less ill appearing, alert and oriented Head- normocephalic, atraumatic Eyes-  Sclera clear, conjunctiva pink Ears- hearing intact Oropharynx- clear Neck- supple Lungs- Clear to ausculation bilaterally, normal work of breathing Heart- Regular rate and rhythm,  GI- soft, NT, ND, + BS Extremities- no clubbing, cyanosis, or edema Skin- no rash or lesion Psych- depressed mood, flat affect Neuro- strength and sensation are intact  LABS: Basic Metabolic Panel:  Recent Labs  08/31/15 0300 09/01/15 0427 09/02/15 0349  NA 138 139 143  K 3.2* 4.3  4.5  CL 102 108 105  CO2 24 25 26   GLUCOSE 86 96 101*  BUN 57* 37* 32*  CREATININE 1.21* 1.04* 1.10*  CALCIUM 9.2 8.9 9.1  MG 2.4  --   --   PHOS 4.1  --   --    CBC:  Recent Labs  08/31/15 0300 09/01/15 0427 09/02/15 0349  WBC 13.3* 12.8* 13.6*  NEUTROABS 8.8* 8.5*  --   HGB 12.8 12.9 11.8*  HCT 39.6 39.9 37.9  MCV 91.2 91.1 92.0  PLT 450* 489* 516*    RADIOLOGY: Dg Chest Port 1 View 08/28/2015  CLINICAL DATA:  Endotracheal tube position EXAM: PORTABLE CHEST 1 VIEW COMPARISON:  08/27/2015 FINDINGS: Endotracheal tube is in good position approximately 3 cm above the carina. NG tube in place with the tip not visualized. Right jugular catheter tip at the cavoatrial junction. No pneumothorax Improvement in bibasilar airspace disease compared with the prior study. There remains left greater than right bibasilar airspace disease. No significant pleural effusion. IMPRESSION: Endotracheal tube now in good position Improved aeration with decrease in bibasilar airspace disease compared with yesterday Electronically Signed   By: Franchot Gallo M.D.   On: 08/28/2015 07:12    ASSESSMENT AND PLAN:  Active Problems:   Cardiac  arrest ([place])   Acute respiratory failure (HCC)   Hypertrophic obstructive cardiomyopathy (HCC)   QT prolongation   Hypokalemia   Essential hypertension   Systolic dysfunction with acute on chronic heart failure (HCC)   Anoxic brain damage (HCC)   Cough  1. VF/PEA arrest The patient had an OOH resuscitated cardiac arrest presumed to be VF with AED advising shocks. She received immediate bystander CPR. Cath this admisssion with no CAD. Echo suggestive of possible nonobstructive hypertrophic CM and EF 40% (possibly due to myocardial stunning). MRI reveals hypertensive rather than hypertrophic CM with EF 45% Keep K >3.9, Mg >1.8 Avoid QT prolonging medications Repeat ekg at this time to assess QT now that electrolytes are improved, psych medicines are washed out,  and she has been fully rewarmed for several days. Plan for ICD placement on Monday.  Risks and benefits of the procedure have been discussed and the patient has no further questions this morning.  She will be made NPO after midnight and SQ heparin will be stopped today.  2. Acute systolic dysfunction Cardiac MRI suggests hypertensive CM Continue Coreg, ACE-I titration over the weekend if HR/BP allow Remains dry Oral hydration encouraged  3. Psych Appreciate input Need to avoid QT prolonging drugs   4. Hypertension Elevated today.  Will increase lisinopril to 20 mg.   Will Meredith Leeds, MD 09/02/2015 7:17 AM

## 2015-09-02 NOTE — Progress Notes (Signed)
Subjective: Neurology called to reassess patient after MRI brain revealed several subcentimeter foci of acute infarction.  Objective: Current vital signs: BP 141/75 mmHg  Pulse 70  Temp(Src) 98.2 F (36.8 C) (Oral)  Resp 18  Ht 5\' 7"  (1.702 m)  Wt 100.336 kg (221 lb 3.2 oz)  BMI 34.64 kg/m2  SpO2 100% Vital signs in last 24 hours: Temp:  [97.4 F (36.3 C)-98.6 F (37 C)] 98.2 F (36.8 C) (02/26 1325) Pulse Rate:  [58-70] 70 (02/26 1325) Resp:  [18] 18 (02/26 1325) BP: (119-160)/(72-93) 141/75 mmHg (02/26 1325) SpO2:  [95 %-100 %] 100 % (02/26 1325) Weight:  [100.336 kg (221 lb 3.2 oz)] 100.336 kg (221 lb 3.2 oz) (02/26 0419)  Intake/Output from previous day: 02/25 0701 - 02/26 0700 In: 480 [P.O.:480] Out: 1050 [Urine:1050] Intake/Output this shift: Total I/O In: 600 [P.O.:600] Out: 1900 [Urine:1900] Nutritional status: Diet Heart Room service appropriate?: Yes; Fluid consistency:: Thin Diet NPO time specified  Neurologic Exam: General: NAD Mental Status: Alert, oriented, thought content appropriate.  Speech fluent without evidence of aphasia.  Able to follow all commands without difficulty.  Cranial Nerves: II:  PERRL, fixates and tracks normally III,IV, VI: ptosis not present, extra-ocular motions intact bilaterally V,VII: smile symmetric, facial light touch sensation normal bilaterally VIII: hearing intact to conversation IX,X: no dysphonia XI: bilateral shoulder shrug equal XII: midline tongue extension Motor: Right : Upper extremity   4+/5    Left:     Upper extremity   4+5  Lower extremity   4+/5     Lower extremity   4+5 Normal tone throughout; no atrophy noted Sensory: Fine touch intact x 4, without extinction Deep Tendon Reflexes: 2+ symmetrically x 4 Cerebellar: No ataxia on FNF.  Gait: Able to rise from seat with own power. Mild gait unsteadiness. Otherwise unremarkable.   Lab Results: Basic Metabolic Panel:  Recent Labs Lab 08/27/15 0856  08/28/15 0445 08/29/15 0515 08/30/15 0615 08/31/15 0300 09/01/15 0427 09/02/15 0349  NA  --   --  145 139 138 139 143  K  --   --  3.2* 3.0* 3.2* 4.3 4.5  CL  --   --  100* 101 102 108 105  CO2  --   --  27 26 24 25 26   GLUCOSE  --   --  134* 118* 86 96 101*  BUN  --   --  39* 50* 57* 37* 32*  CREATININE  --   --  1.21* 1.26* 1.21* 1.04* 1.10*  CALCIUM  --   --  9.2 9.1 9.2 8.9 9.1  MG 1.9 1.8 2.1 2.1 2.4  --   --   PHOS 2.2* 2.3* 4.7* 4.8* 4.1  --   --     Liver Function Tests:  Recent Labs Lab 09/02/15 0349  AST 26  ALT 42  ALKPHOS 94  BILITOT 0.3  PROT 5.7*  ALBUMIN 2.5*   No results for input(s): LIPASE, AMYLASE in the last 168 hours. No results for input(s): AMMONIA in the last 168 hours.  CBC:  Recent Labs Lab 08/29/15 0515 08/30/15 0615 08/31/15 0300 09/01/15 0427 09/02/15 0349  WBC 16.7* 13.7* 13.3* 12.8* 13.6*  NEUTROABS 15.0* 11.6* 8.8* 8.5*  --   HGB 12.3 12.8 12.8 12.9 11.8*  HCT 38.4 39.5 39.6 39.9 37.9  MCV 90.6 89.2 91.2 91.1 92.0  PLT 473* 516* 450* 489* 516*    Cardiac Enzymes: No results for input(s): CKTOTAL, CKMB, CKMBINDEX, TROPONINI in the last 168  hours.  Lipid Panel: No results for input(s): CHOL, TRIG, HDL, CHOLHDL, VLDL, LDLCALC in the last 168 hours.  CBG:  Recent Labs Lab 09/01/15 1115 09/01/15 1620 09/01/15 2157 09/02/15 0631 09/02/15 1117  GLUCAP 90 64* 102* 18 123*    Microbiology: Results for orders placed or performed during the hospital encounter of 08/24/15  Culture, respiratory (NON-Expectorated)     Status: None   Collection Time: 08/24/15  5:08 PM  Result Value Ref Range Status   Specimen Description TRACHEAL ASPIRATE  Final   Special Requests NONE  Final   Gram Stain   Final    ABUNDANT WBC PRESENT,BOTH PMN AND MONONUCLEAR FEW SQUAMOUS EPITHELIAL CELLS PRESENT ABUNDANT GRAM POSITIVE COCCI IN PAIRS IN CHAINS IN CLUSTERS FEW GRAM NEGATIVE RODS Performed at [facility]    Culture   Final     NORMAL OROPHARYNGEAL FLORA Performed at [facility]    Report Status 08/27/2015 FINAL  Final  MRSA PCR Screening     Status: None   Collection Time: 08/25/15 12:31 PM  Result Value Ref Range Status   MRSA by PCR NEGATIVE NEGATIVE Final    Comment:        The GeneXpert MRSA Assay (FDA approved for NASAL specimens only), is one component of a comprehensive MRSA colonization surveillance program. It is not intended to diagnose MRSA infection nor to guide or monitor treatment for MRSA infections.     Coagulation Studies: No results for input(s): LABPROT, INR in the last 72 hours.  Imaging: Dg Chest 2 View  09/01/2015  CLINICAL DATA:  Patient recently extubated, now with recurrent cough and shortness of breath. EXAM: CHEST  2 VIEW COMPARISON:  08/28/2015 and earlier. FINDINGS: Interval extubation and nasogastric tube removal. Cardiac silhouette markedly enlarged, unchanged. Interval resolution of pulmonary venous hypertension and pulmonary edema. Pulmonary vascularity now normal. Improved aeration in the left lower lobe, with only minimal patchy opacities persisting. Lungs otherwise clear. IMPRESSION: 1. Interval marked improvement in aeration in the left lower lobe since the examination 4 days ago, with only mild residual atelectasis and/or pneumonia. No acute cardiopulmonary disease otherwise. 2. Interval resolution of pulmonary venous hypertension and pulmonary edema. Electronically Signed   By: Evangeline Dakin M.D.   On: 09/01/2015 15:55   Mr Brain Wo Contrast  09/01/2015  CLINICAL DATA:  Cardiac arrest, recovered after epinephrine injection. Progressive dyspnea. Evaluate anoxic brain injury. EXAM: MRI HEAD WITHOUT CONTRAST TECHNIQUE: Multiplanar, multiecho pulse sequences of the brain and surrounding structures were obtained without intravenous contrast. COMPARISON:  CT head August 24, 2015 FINDINGS: Scattered foci of reduced diffusion include LEFT cerebellum, RIGHT caudate  head, RIGHT insula, RIGHT periatrial white matter, LEFT occipital lobe, RIGHT parietal lobe measuring up to 11 mm. Larger areas demonstrate reduced diffusion. No susceptibility artifact to suggest hemorrhage. Additional patchy supratentorial white matter FLAIR T2 hyperintensities exclusive of aforementioned abnormality. Ventricles and sulci are normal for patient's age. No midline shift, mass effect or masses. No abnormal extra-axial fluid collections. Normal major intracranial vascular flow voids present at skull base. Ocular globes and orbital contents are normal. Moderate pan paranasal sinus mucosal thickening. Small bilateral mastoid effusions. No abnormal sellar expansion. No cerebellar tonsillar ectopia. No suspicious calvarial bone marrow signal. IMPRESSION: Scattered supra and infratentorial foci of acute ischemia measuring up to 11 mm, which may represent embolic phenomena, possible component of watershed ischemia. Mild chronic small vessel ischemic disease. Electronically Signed   By: Elon Alas M.D.   On: 09/01/2015 22:12  Current medications: . antiseptic oral rinse  7 mL Mouth Rinse q12n4p  . carvedilol  6.25 mg Oral BID WC  . chlorhexidine gluconate  15 mL Mouth Rinse BID  . dextromethorphan-guaiFENesin  1 tablet Oral BID  . insulin aspart  2-6 Units Subcutaneous 6 times per day  . lisinopril  20 mg Oral Daily  . OXcarbazepine  300 mg Oral BID  . pantoprazole  40 mg Oral Daily  . senna-docusate  2 tablet Oral BID  . sodium chloride flush  10-40 mL Intracatheter Q12H  . sodium chloride flush  3 mL Intravenous Q12H  . venlafaxine XR  75 mg Oral Daily    Assessment: 1. Neurology called to reassess patient after MRI brain revealed several subcentimeter foci of acute infarction. The patient is status post v-fib arrest with anoxic brain injury. 2. MRI performed yesterday reveals scattered supra and infratentorial foci of acute ischemia measuring up to 11 mm, which may represent  embolic phenomena. Although the radiology report lists a possible component of watershed ischemia, based upon my review of the , I do not feel that this is likely. Appearance of the lesions is most consistent with cardioembolic strokes.   3. Elevated BUN/Cr 4. Echocardiogram report reviewed: Technically difficult study, however appears to have asymmetric septal hypertrophic cardiomyoapathy without obstruction. Insetting of VF arrest, would consider cardiac MRI to better evaluate as this could be the potential etiology of her arrhythmia  Recommendations: 1. MRA of the brain without contrast to rule out stenosis  2. Carotid dopplers 3. PT consult, OT consult, Speech consult 4. HgbA1c, fasting lipid panel 5. Start ASA if not contraindicated from a medical standpoint 6. Risk factor modification 7. Telemetry monitoring. 8. TEE.  9. Call neurology when testing is completed.  10. If above work up is negative, may be a candidate for a Holter monitor or loop recorder.   Kerney Elbe, MD 09/02/2015, 2:54 PM

## 2015-09-02 NOTE — Progress Notes (Signed)
PROGRESS NOTE  Marie Ramos [id number] DOB: June 25, 1956 DOA: 08/24/2015 PCP: No primary care provider on file.  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 Admitted after a witnessed cardiac arrest. The initial rhythm was likely A. fib, V. tach as the AED delivered shocks. She then went to PEA upon EMS arrival and pulse was recovered after Epi injection. later patient's rhythm converted into SVT. She was shocked and was converted to NSR at 360 joulesTime to ROSC is estimated at around 10 minutes. Patient arrived to the ER on 2/17 unresponsive. She was found to become progressively dyspnea, initial abg showed P/F ration of 193, co2 retention and ph 7.2, patient was intubated in the ED , stat icu and cardiology consult and admitted to icu. Hypotermia protocol initiated in the ED  Patient had been reportedly increasing her psychiatric medications to deal with her bipolar disease. She has prolonged QTC at 530. Her cardiac arrest may be secondary to arrhythmia related to qTC prolongation  HPI/Recap of past 24 hours:  Sitting in chair, denies pain , reported cough less,  Cognition continue to improve per family, now is 80% back to her baseline, family in room  Assessment/Plan: Active Problems:   Cardiac arrest ([name])   Acute respiratory failure ([place])   Hypertrophic obstructive cardiomyopathy (H[facility])   QT prolongation   Hypokalemia   Essential hypertension   Systolic dysfunction with acute on chronic heart failure (HCC)   Anoxic brain damage (H[facility])   Cough   Cerebral infarction due to embolism of cerebral artery ([place])  Witnessed out of hospital cardiac arrest: with shockable rhythm, ROSC after 55mins of cpr, urgent left heart cath no LAD. Cardiac arrest thought due to prolonged QTc from  psych meds. Psych meds adjusted ,  EP following. Plan to place ICD on Monday for secondary prevention of VF arrest. Will need to make npo and stop heparin prophylasix  Acute respiratory failure due to cardiac arrest required intubation from 2/17 to 2/21. Now on room air.  Mild cognitive impairment, mri brian to evaluate anoxic brain injury.   Scattered supra and infratentorial foci of acute ischemia measuring up to 11 mm, which may represent embolic phenomena, possible component of watershed ischemia. Mild chronic small vessel ischemic disease. Neurology input appreciated, recommended to order mra head without contrast, carotid doppler, TEE,  i have informed cardiology about neurologist request for TEE.  Bipolar: psych input appreciated, meds changed to trileptal and continue effexor, family want update from psych in the next few days. i talked to psychiatry Dr Lenna Sciara today, he recommended continue current meds and outpatient psych follow up.  Hypokalemia, replace k  Acute kidney injury: cr normal at baseline, today 1.21, adequate urine output, renal dosing meds,  ua showed bacteriuria, urine culture pending.  HTN: on coreg/lisinopril  Dilated hypertensive cardiomyopathy/systolic and diastolic chf: currently euvolemic. Close monitor volume status. Patient received lasix while on vent. echo lvef [id number], grade i diastolic dysfunction.  Cough: lung exam clear, no edema, no fever, no leukocytosis, cxr 2 view showed improvement, start incentive spirometer, mucinex. Patient is newly started on lisinopril, side effect from lisinopril?   Obsesity; life style modification  Deconditioning: CIR   Code Status: full  Family Communication: patient and family  Disposition Plan: CIR   Consultants:  Cardiology/EP  Critical care  admit, transfer to [facility] on 2/24  Psychiatry  CIR(inpatient rehab)  neurology  Procedures:  Intubation 2/17/extubation 2/21 Stat echo , stat cardiac cath showed  Widely patent coronary arteries  Normal LVEDP  EEG on 2/17: This is an abnormal routine inpatient EEG, suggestive of encephalopathy, likely medication effect as patient is on sedation, although cannot exclude hypoxic ischemic encephalopathy based on the clinical history of post cardiac arrest. No evidence of electrographic seizures were seen. If clinically indicated, consider repeating the EEG when off of sedation. Clinical correlation is recommended .   Right IJ placed on 2/17  Hypothermia protocol from 2/17 to 2/19  Tube feed while on vent  ICD placement on 2/27  Antibiotics:  none   Objective: BP 141/75 mmHg  Pulse 70  Temp(Src) 98.2 F (36.8 C) (Oral)  Resp 18  Ht 5\' 7"  (1.702 m)  Wt 100.336 kg (221 lb 3.2 oz)  BMI 34.64 kg/m2  SpO2 100%  Intake/Output Summary (Last 24 hours) at 09/02/15 1621 Last data filed at 09/02/15 1300  Gross per 24 hour  Intake   1080 ml  Output   2950 ml  Net  -1870 ml   Filed Weights   08/31/15 0500 09/01/15 0448 09/02/15 0419  Weight: 98.385 kg (216 lb 14.4 oz) 100.653 kg (221 lb 14.4 oz) 100.336 kg (221 lb 3.2 oz)    Exam:   General:  NAD, sitting in chair  Cardiovascular: RRR  Respiratory: CTABL  Abdomen: Soft/ND/NT, positive BS  Musculoskeletal: No Edema  Neuro: mild cognitive impairment, no focal deficit, aaox3, still poor short term memory  Data Reviewed: Basic Metabolic Panel:  Recent Labs Lab 08/27/15 0856 08/28/15 0445 08/29/15 0515 08/30/15 0615 08/31/15 0300 09/01/15 0427 09/02/15 0349  NA  --   --  145 139 138 139 143  K  --   --  3.2* 3.0* 3.2* 4.3 4.5  CL  --   --  100* 101 102 108 105  CO2  --   --  27 26 24 25 26   GLUCOSE  --   --  134* 118* 86 96 101*  BUN  --   --  39* 50* 57* 37* 32*  CREATININE  --   --  1.21* 1.26* 1.21* 1.04* 1.10*  CALCIUM  --   --  9.2 9.1 9.2 8.9 9.1  MG 1.9 1.8 2.1 2.1 2.4  --   --   PHOS 2.2* 2.3* 4.7* 4.8* 4.1  --   --    Liver Function Tests:  Recent Labs Lab  09/02/15 0349  AST 26  ALT 42  ALKPHOS 94  BILITOT 0.3  PROT 5.7*  ALBUMIN 2.5*   No results for input(s): LIPASE, AMYLASE in the last 168 hours. No results for input(s): AMMONIA in the last 168 hours. CBC:  Recent Labs Lab 08/29/15 0515 08/30/15 0615 08/31/15 0300 09/01/15 0427 09/02/15 0349  WBC 16.7* 13.7* 13.3* 12.8* 13.6*  NEUTROABS 15.0* 11.6* 8.8* 8.5*  --   HGB 12.3 12.8 12.8 12.9 11.8*  HCT 38.4 39.5 39.6 39.9 37.9  MCV 90.6 89.2 91.2 91.1 92.0  PLT 473* 516* 450* 489* 516*   Cardiac Enzymes:   No results for input(s): CKTOTAL, CKMB, CKMBINDEX, TROPONINI in the last 168 hours. BNP (last 3 results) No results for input(s): BNP in the last 8760 hours.  ProBNP (last 3 results) No results for input(s): PROBNP in the last 8760 hours.  CBG:  Recent Labs Lab 09/01/15 1115 09/01/15 1620 09/01/15 2157 09/02/15  0631 09/02/15 1117  GLUCAP 90 64* 102* 88 123*    Recent Results (from the past 240 hour(s))  Culture, respiratory (NON-Expectorated)     Status: None   Collection Time: 08/24/15  5:08 PM  Result Value Ref Range Status   Specimen Description TRACHEAL ASPIRATE  Final   Special Requests NONE  Final   Gram Stain   Final    ABUNDANT WBC PRESENT,BOTH PMN AND MONONUCLEAR FEW SQUAMOUS EPITHELIAL CELLS PRESENT ABUNDANT GRAM POSITIVE COCCI IN PAIRS IN CHAINS IN CLUSTERS FEW GRAM NEGATIVE RODS Performed at [facility]    Culture   Final    NORMAL OROPHARYNGEAL FLORA Performed at [facility]    Report Status 08/27/2015 FINAL  Final  MRSA PCR Screening     Status: None   Collection Time: 08/25/15 12:31 PM  Result Value Ref Range Status   MRSA by PCR NEGATIVE NEGATIVE Final    Comment:        The GeneXpert MRSA Assay (FDA approved for NASAL specimens only), is one component of a comprehensive MRSA colonization surveillance program. It is not intended to diagnose MRSA infection nor to guide or monitor treatment for MRSA  infections.      Studies: Mr Brain Wo Contrast  09/01/2015  CLINICAL DATA:  Cardiac arrest, recovered after epinephrine injection. Progressive dyspnea. Evaluate anoxic brain injury. EXAM: MRI HEAD WITHOUT CONTRAST TECHNIQUE: Multiplanar, multiecho pulse sequences of the brain and surrounding structures were obtained without intravenous contrast. COMPARISON:  CT head August 24, 2015 FINDINGS: Scattered foci of reduced diffusion include LEFT cerebellum, RIGHT caudate head, RIGHT insula, RIGHT periatrial white matter, LEFT occipital lobe, RIGHT parietal lobe measuring up to 11 mm. Larger areas demonstrate reduced diffusion. No susceptibility artifact to suggest hemorrhage. Additional patchy supratentorial white matter FLAIR T2 hyperintensities exclusive of aforementioned abnormality. Ventricles and sulci are normal for patient's age. No midline shift, mass effect or masses. No abnormal extra-axial fluid collections. Normal major intracranial vascular flow voids present at skull base. Ocular globes and orbital contents are normal. Moderate pan paranasal sinus mucosal thickening. Small bilateral mastoid effusions. No abnormal sellar expansion. No cerebellar tonsillar ectopia. No suspicious calvarial bone marrow signal. IMPRESSION: Scattered supra and infratentorial foci of acute ischemia measuring up to 11 mm, which may represent embolic phenomena, possible component of watershed ischemia. Mild chronic small vessel ischemic disease. Electronically Signed   By: Elon Alas M.D.   On: 09/01/2015 22:12    Scheduled Meds: . antiseptic oral rinse  7 mL Mouth Rinse q12n4p  . carvedilol  6.25 mg Oral BID WC  . chlorhexidine gluconate  15 mL Mouth Rinse BID  . dextromethorphan-guaiFENesin  1 tablet Oral BID  . insulin aspart  2-6 Units Subcutaneous 6 times per day  . lisinopril  20 mg Oral Daily  . OXcarbazepine  300 mg Oral BID  . pantoprazole  40 mg Oral Daily  . senna-docusate  2 tablet Oral BID  .  sodium chloride flush  10-40 mL Intracatheter Q12H  . sodium chloride flush  3 mL Intravenous Q12H  . venlafaxine XR  75 mg Oral Daily    Continuous Infusions:    Time spent: 67mins  Tri Chittick MD, PhD  [facility] Pager [phone]. If 7PM-7AM, please contact night-coverage at [email or web address], password [facility] 09/02/2015, 4:21 PM  LOS: 9 days

## 2015-09-03 ENCOUNTER — Encounter (HOSPITAL_COMMUNITY): Payer: Self-pay | Admitting: Physical Medicine and Rehabilitation

## 2015-09-03 ENCOUNTER — Encounter (HOSPITAL_COMMUNITY)
Admission: EM | Disposition: A | Discharge status: Rehabilitation Facility | Payer: Self-pay | Source: Home / Self Care | Attending: Internal Medicine

## 2015-09-03 ENCOUNTER — Inpatient Hospital Stay (HOSPITAL_COMMUNITY): Payer: BLUE CROSS/BLUE SHIELD

## 2015-09-03 DIAGNOSIS — D72829 Elevated white blood cell count, unspecified: Secondary | ICD-10-CM | POA: Diagnosis present

## 2015-09-03 DIAGNOSIS — D473 Essential (hemorrhagic) thrombocythemia: Secondary | ICD-10-CM

## 2015-09-03 DIAGNOSIS — F3170 Bipolar disorder, currently in remission, most recent episode unspecified: Secondary | ICD-10-CM | POA: Diagnosis present

## 2015-09-03 DIAGNOSIS — D75839 Thrombocytosis, unspecified: Secondary | ICD-10-CM | POA: Diagnosis present

## 2015-09-03 DIAGNOSIS — I6340 Cerebral infarction due to embolism of unspecified cerebral artery: Secondary | ICD-10-CM

## 2015-09-03 DIAGNOSIS — I639 Cerebral infarction, unspecified: Secondary | ICD-10-CM

## 2015-09-03 DIAGNOSIS — I4901 Ventricular fibrillation: Secondary | ICD-10-CM

## 2015-09-03 DIAGNOSIS — G931 Anoxic brain damage, not elsewhere classified: Secondary | ICD-10-CM

## 2015-09-03 DIAGNOSIS — Z87820 Personal history of traumatic brain injury: Secondary | ICD-10-CM | POA: Diagnosis present

## 2015-09-03 HISTORY — PX: EP IMPLANTABLE DEVICE: SHX172B

## 2015-09-03 HISTORY — DX: Ventricular fibrillation: I49.01

## 2015-09-03 LAB — BASIC METABOLIC PANEL
ANION GAP: 8 (ref 5–15)
BUN: 26 mg/dL — ABNORMAL HIGH (ref 6–20)
CALCIUM: 9.1 mg/dL (ref 8.9–10.3)
CHLORIDE: 103 mmol/L (ref 101–111)
CO2: 27 mmol/L (ref 22–32)
CREATININE: 0.89 mg/dL (ref 0.44–1.00)
GFR calc non Af Amer: >60 mL/min (ref >60)
Glucose, Bld: 99 mg/dL (ref 65–99)
Potassium: 4.5 mmol/L (ref 3.5–5.1)
SODIUM: 138 mmol/L (ref 135–145)

## 2015-09-03 LAB — CBC
HEMATOCRIT: 38.7 % (ref 36.0–46.0)
HEMOGLOBIN: 11.9 g/dL — AB (ref 12.0–15.0)
MCH: 28.5 pg (ref 26.0–34.0)
MCHC: 30.7 g/dL (ref 30.0–36.0)
MCV: 92.8 fL (ref 78.0–100.0)
Platelets: 553 10*3/uL — ABNORMAL HIGH (ref 150–400)
RBC: 4.17 MIL/uL (ref 3.87–5.11)
RDW: 15.5 % (ref 11.5–15.5)
WBC: 14.1 10*3/uL — AB (ref 4.0–10.5)

## 2015-09-03 LAB — GLUCOSE, CAPILLARY
GLUCOSE-CAPILLARY: 69 mg/dL (ref 65–99)
GLUCOSE-CAPILLARY: 60 mg/dL — AB (ref 65–99)
Glucose-Capillary: 112 mg/dL — ABNORMAL HIGH (ref 65–99)
Glucose-Capillary: 123 mg/dL — ABNORMAL HIGH (ref 65–99)
Glucose-Capillary: 151 mg/dL — ABNORMAL HIGH (ref 65–99)

## 2015-09-03 LAB — LIPID PANEL
CHOL/HDL RATIO: 5.2 ratio
CHOLESTEROL: 225 mg/dL — AB (ref 0–200)
HDL: 43 mg/dL (ref >40)
LDL Cholesterol: 115 mg/dL — ABNORMAL HIGH (ref 0–99)
Triglycerides: 333 mg/dL — ABNORMAL HIGH (ref <150)
VLDL: 67 mg/dL — ABNORMAL HIGH (ref 0–40)

## 2015-09-03 LAB — PROTIME-INR
INR: 1.09 (ref 0.00–1.49)
PROTHROMBIN TIME: 14.3 s (ref 11.6–15.2)

## 2015-09-03 SURGERY — ICD IMPLANT
Anesthesia: LOCAL

## 2015-09-03 MED ORDER — DEXTROSE 50 % IV SOLN
INTRAVENOUS | Status: AC
  Administered 2015-09-03: 25 mL
  Filled 2015-09-03: qty 50

## 2015-09-03 MED ORDER — CEFAZOLIN SODIUM-DEXTROSE 2-3 GM-% IV SOLR
2.0000 g | INTRAVENOUS | Status: AC
  Administered 2015-09-03: 2 g via INTRAVENOUS

## 2015-09-03 MED ORDER — MIDAZOLAM HCL 5 MG/5ML IJ SOLN
INTRAMUSCULAR | Status: AC
  Filled 2015-09-03: qty 5

## 2015-09-03 MED ORDER — HEPARIN (PORCINE) IN NACL 2-0.9 UNIT/ML-% IJ SOLN
INTRAMUSCULAR | Status: DC | PRN
  Administered 2015-09-03: 500 mL

## 2015-09-03 MED ORDER — CHLORHEXIDINE GLUCONATE 4 % EX LIQD
60.0000 mL | Freq: Once | CUTANEOUS | Status: AC
  Administered 2015-09-03: 4 via TOPICAL

## 2015-09-03 MED ORDER — LIDOCAINE HCL (PF) 1 % IJ SOLN
INTRAMUSCULAR | Status: AC
  Filled 2015-09-03: qty 60

## 2015-09-03 MED ORDER — CHLORHEXIDINE GLUCONATE 4 % EX LIQD
60.0000 mL | Freq: Once | CUTANEOUS | Status: AC
  Administered 2015-09-03: 4 via TOPICAL
  Filled 2015-09-03: qty 60

## 2015-09-03 MED ORDER — CEFAZOLIN SODIUM-DEXTROSE 2-3 GM-% IV SOLR
INTRAVENOUS | Status: AC
  Filled 2015-09-03: qty 50

## 2015-09-03 MED ORDER — SODIUM CHLORIDE 0.9 % IR SOLN
80.0000 mg | Status: AC
  Administered 2015-09-03: 80 mg
  Filled 2015-09-03: qty 2

## 2015-09-03 MED ORDER — SODIUM CHLORIDE 0.45 % IV SOLN: INTRAVENOUS | Status: DC

## 2015-09-03 MED ORDER — LIDOCAINE HCL (PF) 1 % IJ SOLN
INTRAMUSCULAR | Status: DC | PRN
  Administered 2015-09-03: 25 mL

## 2015-09-03 MED ORDER — ASPIRIN EC 325 MG PO TBEC: 325.0000 mg | DELAYED_RELEASE_TABLET | Freq: Every day | ORAL | Status: DC

## 2015-09-03 MED ORDER — SODIUM CHLORIDE 0.9 % IV SOLN
INTRAVENOUS | Status: DC
  Administered 2015-09-03: 50 mL/h via INTRAVENOUS

## 2015-09-03 MED ORDER — POLYVINYL ALCOHOL 1.4 % OP SOLN
2.0000 [drp] | OPHTHALMIC | Status: DC | PRN
  Administered 2015-09-04: 2 [drp] via OPHTHALMIC
  Filled 2015-09-03: qty 15

## 2015-09-03 MED ORDER — FENTANYL CITRATE (PF) 100 MCG/2ML IJ SOLN
INTRAMUSCULAR | Status: DC | PRN
  Administered 2015-09-03 (×4): 25 ug via INTRAVENOUS

## 2015-09-03 MED ORDER — HEPARIN SODIUM (PORCINE) 1000 UNIT/ML IJ SOLN
INTRAMUSCULAR | Status: AC
  Filled 2015-09-03: qty 1

## 2015-09-03 MED ORDER — IOHEXOL 350 MG/ML SOLN
INTRAVENOUS | Status: DC | PRN
  Administered 2015-09-03: 15 mL via INTRAVENOUS

## 2015-09-03 MED ORDER — SODIUM CHLORIDE 0.9 % IR SOLN
Status: AC
  Filled 2015-09-03: qty 2

## 2015-09-03 MED ORDER — MIDAZOLAM HCL 5 MG/5ML IJ SOLN
INTRAMUSCULAR | Status: DC | PRN
  Administered 2015-09-03: 1 mg via INTRAVENOUS
  Administered 2015-09-03 (×2): 2 mg via INTRAVENOUS
  Administered 2015-09-03 (×2): 1 mg via INTRAVENOUS

## 2015-09-03 MED ORDER — FENTANYL CITRATE (PF) 100 MCG/2ML IJ SOLN
INTRAMUSCULAR | Status: AC
  Filled 2015-09-03: qty 2

## 2015-09-03 MED ORDER — ATORVASTATIN CALCIUM 20 MG PO TABS
20.0000 mg | ORAL_TABLET | Freq: Every day | ORAL | Status: DC
  Administered 2015-09-03 – 2015-09-05 (×3): 20 mg via ORAL
  Filled 2015-09-03 (×4): qty 1

## 2015-09-03 SURGICAL SUPPLY — 9 items
CABLE SURGICAL S-101-97-12 (CABLE) IMPLANT
ELECT DEFIB PAD ADLT CADENCE (PAD) ×2 IMPLANT
ICD EVERA DR XT MRI DDMB1D4 (ICD Generator) ×2 IMPLANT
LEAD CAPSURE NOVUS 45CM (Lead) ×2 IMPLANT
LEAD SPRINT QUAT SEC 6935M-62 (Lead) ×2 IMPLANT
PAD DEFIB LIFELINK (PAD) ×2 IMPLANT
SHEATH CLASSIC 7F (SHEATH) ×2 IMPLANT
SHEATH CLASSIC 9F (SHEATH) ×2 IMPLANT
TRAY PACEMAKER INSERTION (PACKS) IMPLANT

## 2015-09-03 NOTE — Progress Notes (Signed)
Noted progress with therapy. I will place CIR consult as recommended. [id number]

## 2015-09-03 NOTE — Progress Notes (Signed)
Marie Ramos STROKE TEAM PROGRESS NOTE   SUBJECTIVE (INTERVAL HISTORY) Her family is at the bedside.  She is on the way to the cath lab for ICD placement. She is awake alert and following commands. Mild dysarthria but no distress. TEE not necessary and cancelled.   OBJECTIVE Temp:  [97.7 F (36.5 C)-98.2 F (36.8 C)] 97.7 F (36.5 C) (02/27 0923) Pulse Rate:  [70-99] 70 (02/27 0923) Cardiac Rhythm:  [-] Heart block (02/27 0700) Resp:  [18] 18 (02/27 0923) BP: (126-176)/(64-87) 176/87 mmHg (02/27 0923) SpO2:  [98 %-100 %] 100 % (02/27 0923) Weight:  [100.381 kg (221 lb 4.8 oz)] 100.381 kg (221 lb 4.8 oz) (02/27 0405)  CBC:  Recent Labs Lab 08/31/15 0300 09/01/15 0427 09/02/15 0349 09/03/15 0248  WBC 13.3* 12.8* 13.6* 14.1*  NEUTROABS 8.8* 8.5*  --   --   HGB 12.8 12.9 11.8* 11.9*  HCT 39.6 39.9 37.9 38.7  MCV 91.2 91.1 92.0 92.8  PLT 450* 489* 516* 553*    Basic Metabolic Panel:  Recent Labs Lab 08/30/15 0615 08/31/15 0300  09/02/15 0349 09/03/15 0248  NA 139 138  < > 143 138  K 3.0* 3.2*  < > 4.5 4.5  CL 101 102  < > 105 103  CO2 26 24  < > 26 27  GLUCOSE 118* 86  < > 101* 99  BUN 50* 57*  < > 32* 26*  CREATININE 1.26* 1.21*  < > 1.10* 0.89  CALCIUM 9.1 9.2  < > 9.1 9.1  MG 2.1 2.4  --   --   --   PHOS 4.8* 4.1  --   --   --   < > = values in this interval not displayed.  Lipid Panel:    Component Value Date/Time   CHOL 225* 09/03/2015 0248   TRIG 333* 09/03/2015 0248   HDL 43 09/03/2015 0248   CHOLHDL 5.2 09/03/2015 0248   VLDL 67* 09/03/2015 0248   LDLCALC 115* 09/03/2015 0248   HgbA1c: No results found for: HGBA1C Urine Drug Screen: No results found for: LABOPIA, COCAINSCRNUR, LABBENZ, AMPHETMU, THCU, LABBARB    IMAGING I have personally reviewed the radiological images below and agree with the radiology interpretations.  Dg Chest 2 View 09/01/2015   1. Interval marked improvement in aeration in the left lower lobe since the examination 4 days ago, with  only mild residual atelectasis and/or pneumonia. No acute cardiopulmonary disease otherwise. 2. Interval resolution of pulmonary venous hypertension and pulmonary edema.   Mr Marie Ramos Head Wo Contrast 09/02/2015   1. Mild atherosclerotic irregularity without a significant proximal stenosis, aneurysm, or branch vessel occlusion to account for the patient's acute infarcts.   Mr Brain Wo Contrast 09/01/2015   Scattered supra and infratentorial foci of acute ischemia measuring up to 11 mm, which may represent embolic phenomena, possible component of watershed ischemia. Mild chronic small vessel ischemic disease.   Carotid Doppler   There is 1-39% bilateral ICA stenosis. Vertebral artery flow is antegrade.    2D Echocardiogram  - Left ventricle: The cavity size was normal. Systolic function wasmildly to moderately reduced. The estimated ejection fraction wasin the range of 40% to 45%. There is severe asymmetric septalhypetrophy without dynamic obstruction. Findings suggestive ofhypertrophic nonobstructive cardiomyopathy. Diffuse hypokinesis.Doppler parameters are consistent with abnormal left ventricularrelaxation (grade 1 diastolic dysfunction). Septal thickness, ED(2D): 17 mm. Posterior wall thickness, ED (2D): 10.5 mm. - Technically difficult study. Impressions:  Technically difficult study, however appears to have asymmetricseptal hypertrophic cardiomyoapathy  without obstruction. Insetting of VF arrest, would consider cardiac MRI to betterevaluate as this could be the potential etiology of herarrhythmia.  Cardiac MRI 08/31/2015 findings are suggestive of dilated hypertensive cardiomyopathy rather than hypertrophic cardiomyopathy.  LE venous doppler - pending   PHYSICAL EXAM  Temp:  [97.6 F (36.4 C)-98.1 F (36.7 C)] 97.6 F (36.4 C) (02/27 1540) Pulse Rate:  [0-99] 64 (02/27 1540) Resp:  [0-19] 18 (02/27 1540) BP: (105-198)/(54-122) 156/71 mmHg (02/27 1540) SpO2:  [0 %-100 %] 98 %  (02/27 1540) Weight:  [221 lb 4.8 oz (100.381 kg)] 221 lb 4.8 oz (100.381 kg) (02/27 0405)  General - Well nourished, well developed, in no apparent distress.  Ophthalmologic - Fundi not visualized due to noncooperation.  Cardiovascular - Regular rate and rhythm.  Mental Status -  Level of arousal and orientation to time, place, and person were intact. Language including expression, naming, repetition, comprehension was assessed and found intact. Fund of Knowledge was assessed and was intact.  Cranial Nerves II - XII - II - Visual field intact OU. III, IV, VI - Extraocular movements intact. V - Facial sensation intact bilaterally. VII - Facial movement intact bilaterally. VIII - Hearing & vestibular intact bilaterally. X - Palate elevates symmetrically, mild dysarthria. XI - Chin turning & shoulder shrug intact bilaterally. XII - Tongue protrusion intact.  Motor Strength - The patient's strength was 4+/5 in all extremities and pronator drift was absent.  Bulk was normal and fasciculations were absent.   Motor Tone - Muscle tone was assessed at the neck and appendages and was normal.  Reflexes - The patient's reflexes were 1+ in all extremities and she had no pathological reflexes.  Sensory - Light touch, temperature/pinprick were assessed and were symmetrical.    Coordination - The patient had normal movements in the hand with no ataxia or dysmetria.  Tremor was absent.  Gait and Station - not tested as pt is going to ICD placement    ASSESSMENT/PLAN [name] is a 60 y.o. female with history of bipolar disease, admitted post VFib arrest with resultant cardioembolic infarcts seen on MRI.  She was not considered for IV t-PA due to non-stroke presentation.   Stroke:  Scattered supra and infratentorial foci of acute ischemia,  infarcts embolic could be result of recent cardiac cath   MRI  Scattered supra and infratentorial foci of acute ischemia. small vessel  disease   MRA  Mild atherosclerosis  Carotid Doppler  No significant stenosis   2D Echo  EF 40-45%, no source of embolus  Cardiac MRI no source of embolus  LE venous doppler pending  TEE not necessary from the neurology standpoint  LDL 115  HgbA1c pending  IV heparin for VTE prophylaxis  Diet NPO time specified  Diet NPO time specified Except for: Sips with Meds  No antithrombotic prior to admission, now on No antithrombotic. Add aspirin 325 mg for secondary stroke prevention once stable post ICD placement, defer timing to cardiology  Ongoing aggressive stroke risk factor management  Therapy recommendations:  CIR  Disposition:  Return home  Hypertension  Stable  Hyperlipidemia  Home meds:  No statin  LDL 115, goal < 70  Added lipitor 20 mg dialy  Continue statin at discharge  Other Stroke Risk Factors  Obesity, Body mass index is 34.65 kg/(m^2).   Other Active Problems  Cardiac arrest likely due to increased psych medications that prolonged her QT, intubated 2/17 to 2/21, ICD placement 2/27  Hospital day #  [place] for Pager information 09/03/2015 1:54 PM   I, the attending vascular neurologist, have personally obtained a history, examined the patient, evaluated laboratory data, individually viewed imaging studies and agree with radiology interpretations. I obtained additional history from pt at bedside. I also discussed with Dr. Florencia Reasons regarding his care plan. Together with the NP/PA, we formulated the assessment and plan of care which reflects our mutual decision.  I have made any additions or clarifications directly to the above note and agree with the findings and plan as currently documented.   Cardiac arrest s/p CPR and cardiac cath. Embolic stroke on MRI likely related to cardiac cath procedure. TTE and cardiac MRI did not show cardiac embolus, no need TEE from stroke standpoint. ICD placement today, will  also able to monitor if there is afib over time. Recommend ASA and statin for stroke prevention. Venous doppler and A1C pending.  Rosalin Hawking, MD PhD Stroke Neurology 09/03/2015 4:20 PM       To contact Stroke Continuity provider, please refer to [email or web address] After hours, contact General Neurology

## 2015-09-03 NOTE — Progress Notes (Signed)
Hypoglycemic Event  CBG: 69  Treatment: 1/2 amp of Dextrose 50%  Symptoms: none  Follow-up CBG: [id number] CBG Result:112  Possible Reasons for Event: unknown  Comments/MD notified:no    [place], Marie Ramos

## 2015-09-03 NOTE — Progress Notes (Signed)
Inpatient Rehabilitation  Met with patient's husband and son to discuss IP Rehab following acute hospital stay.  Discussed need to await PT evaluation and recommendations prior to seeking insurance authorization.  Husband expressed interest and initial questions were answered.  Informed husband that my co-worker Manuela Schwartz will be following up with him and watching for PT eval recommendations as well as medical readiness.    Carmelia Roller., CCC/SLP Admission Coordinator  [place]  Cell [phone]

## 2015-09-03 NOTE — Progress Notes (Signed)
Preliminary results by tech - Carotid Duplex completed. No evidence of stenosis noted bilaterally. Vertebral arteries demonstrate antegrade flow. Oda Cogan, BS, RDMS, RVT

## 2015-09-03 NOTE — Progress Notes (Signed)
PT Cancellation Note  Patient Details Name: Marie Ramos MRN: [id number] DOB: 1955-12-07   Cancelled Treatment:    Reason Eval/Treat Not Completed: Patient at procedure or test/unavailable; pt in cath lab for ICD placement.  Will attempt again another day.   Reginia Naas 09/03/2015, 11:33 AM  Magda Kiel, [place] 09/03/2015

## 2015-09-03 NOTE — Consult Note (Signed)
Physical Medicine and Rehabilitation Consult  Reason for Consult:  Cardiac arrest with hypoxic encephalopathy and embolic/watershed stroke.  Referring Physician: Dr. Erlinda Hong.    HPI: Marie Ramos is a 60 y.o. female with history of fall with TBI/splenectomy,  bipolar disorder with recent increase in psychiatric medications who was admitted on 08/24/15 after witnessed cardiac arrest. Patient unresponsive and pulseless and AED placed by school nurse with 2 shocks and CPR initiated. EMS evaluation showed PEA and ROSC after epinephrine--down time 10 minutes. Initial rhythm presumed to be VT. Patient intubated and cardiac cath was negative for CAD. She was treated with hypothermia protocol and arrest felt to be due to QT elongation. 2D echo with asymmetric septal hypertrophic CM without obstruction.   Cardiac MRI showed hypertensive rather than HCM with EF 45%. She was extubated without difficulty on 02/23 and Psych consulted for medication adjustment and seroquel and adderral discontinued. Trileptal added to help with mood stabilization and high levels of anxiety managed with prn Xanax. Neurology consulted due to confusion and MRI brain done revealing scattered supra and infratentorial foci of acute ischemia measuring up to 11 mm-- question embolic phenomena and possible component of watershed ischemia. MRA without significant stenosis or aneurysm. Acute systolic dysfunction treated with coreg and ACE titration and ICD to be placed today. Therapy initiated and patient noted to have impaired cognition with generalized weakness, SOB as well as decreased balance. CIR recommended for follow up therapy.      Review of Systems  Constitutional: Negative for diaphoresis.  HENT: Negative for hearing loss.   Eyes: Negative for blurred vision and double vision.  Respiratory: Positive for cough and shortness of breath.   Cardiovascular: Positive for chest pain and palpitations.  Gastrointestinal: Negative  for heartburn, nausea, abdominal pain and constipation.  Genitourinary: Negative for dysuria and urgency.  Musculoskeletal: Positive for back pain. Negative for myalgias.  Neurological: Positive for weakness. Negative for dizziness, tingling, sensory change, speech change and headaches.  Psychiatric/Behavioral: The patient is nervous/anxious. The patient does not have insomnia.   All other systems reviewed and are negative.     Past Medical History  Diagnosis Date  . Bipolar 1 disorder ([place])   . Hypertrophic obstructive cardiomyopathy ([place]) 08/29/2015  . Cardiac arrest ([place]) 08/24/2015  . QT prolongation 08/29/2015  . TBI (traumatic brain injury) [facility]) 2011    Inpatient rehab [facility]  . Macular degeneration   . Chronically dry eyes     Past Surgical History  Procedure Laterality Date  . Cardiac catheterization N/A 08/24/2015    Procedure: Left Heart Cath and Coronary Angiography;  Surgeon: Sherren Mocha, MD;  Location: [facility];  Service: Cardiovascular;  Laterality: N/A;  . Abdominal hysterectomy    . Tonsillectomy    . Splenectomy, total  2011    Family History  Problem Relation Age of Onset  . Cancer Mother   . COPD Father       Social History:  Married. Husband retired but works prn. She was working full time as a Oceanographer. She  reports that she has never smoked. She has never used smokeless tobacco. She reports that she does not drink alcohol or use illicit drugs.    Allergies  Allergen Reactions  . Naproxen Itching  . Vicodin [Hydrocodone-Acetaminophen] Itching   Medications Prior to Admission  Medication Sig Dispense Refill  . amphetamine-dextroamphetamine (ADDERALL) 20 MG tablet Take 20 mg by mouth 3 (three) times daily.   0  .  fexofenadine (ALLEGRA) 60 MG tablet Take 60 mg by mouth daily.    . QUEtiapine (SEROQUEL) 100 MG tablet Take 100 mg by mouth at bedtime.  4  . topiramate (TOPAMAX) 100 MG tablet Take 100 mg by mouth at bedtime.  3  .  venlafaxine XR (EFFEXOR-XR) 75 MG 24 hr capsule Take 75 mg by mouth daily.  4    Home: Home Living Family/patient expects to be discharged to:: Private residence Living Arrangements: Spouse/significant other Available Help at Discharge: Family Type of Home: House Home Access: Level entry Home Layout: Two level Alternate Level Stairs-Number of Steps: 12 Alternate Level Stairs-Rails: Can reach both Home Equipment: None Additional Comments: pt unable to recall bathroom set up, no family in room, pt is a Oceanographer and likes to bake  Functional History: Prior Function Level of Independence: Independent Functional Status:  Mobility: Bed Mobility Overal bed mobility: Needs Assistance Bed Mobility: Supine to Sit Supine to sit: Mod assist General bed mobility comments: pt in chair Transfers Overall transfer level: Needs assistance Equipment used: Rolling walker (2 wheeled), None Transfers: Sit to/from Stand, Stand Pivot Transfers Sit to Stand: Min assist Stand pivot transfers: Mod assist General transfer comment: assist to rise and for UE support for balance Ambulation/Gait Ambulation/Gait assistance: Min assist Ambulation Distance (Feet): 50 Feet (+ 50') Assistive device: Rolling walker (2 wheeled) Gait Pattern/deviations: Step-through pattern, Decreased stride length, Staggering left, Staggering right, Drifts right/left, Trunk flexed General Gait Details: Impulsive, unsteady gait with staggering noted to left/right. + dizziness. Bumping into walls/obstacles in hallway. Easily distracted. 1 seated rest break due to dizziness forced by therapist. Gait velocity interpretation: at or above normal speed for age/gender    ADL: ADL Overall ADL's : Needs assistance/impaired Eating/Feeding: Minimal assistance, Sitting Eating/Feeding Details (indicate cue type and reason): assist to set up tray, cut food, open containers Grooming: Wash/dry hands, Sitting, Minimal  assistance Grooming Details (indicate cue type and reason): decreased task persistence Upper Body Bathing: Maximal assistance, Sitting Lower Body Bathing: Maximal assistance, Sit to/from stand Upper Body Dressing : Sitting, Moderate assistance Lower Body Dressing: Maximal assistance, Sit to/from stand Toilet Transfer: Moderate assistance, BSC, Stand-pivot Toileting- Clothing Manipulation and Hygiene: Sit to/from stand, Moderate assistance  Cognition: Cognition Overall Cognitive Status: Impaired/Different from baseline Orientation Level: Oriented to person, Oriented to place, Disoriented to time Cognition Arousal/Alertness: Awake/alert Behavior During Therapy: Impulsive Overall Cognitive Status: Impaired/Different from baseline Area of Impairment: Orientation, Memory, Following commands, Safety/judgement, Attention Orientation Level: Disoriented to, Situation, Time Current Attention Level: Focused Memory: Decreased short-term memory Following Commands: Follows one step commands with increased time Safety/Judgement: Decreased awareness of safety, Decreased awareness of deficits Awareness: Intellectual Problem Solving: Slow processing, Difficulty sequencing, Requires verbal cues, Requires tactile cues, Decreased initiation General Comments: Requires repetition of cues and manual cues at times to follow commands.    Blood pressure 176/87, pulse 70, temperature 97.7 F (36.5 C), temperature source Oral, resp. rate 18, height 5\' 7"  (1.702 m), weight 100.381 kg (221 lb 4.8 oz), SpO2 100 %. Physical Exam  Nursing note and vitals reviewed. Constitutional: She is oriented to person, place, and time. She appears well-developed and well-nourished.  Morbidly obese female lying in bed with weak intermittent cough.   HENT:  Head: Normocephalic and atraumatic.  Mouth/Throat: Oropharynx is clear and moist.  Eyes: Conjunctivae and EOM are normal. Pupils are equal, round, and reactive to light.   Neck: Normal range of motion. Neck supple. No tracheal deviation present. No thyromegaly present.  Cardiovascular: Normal  rate and regular rhythm.   No murmur heard. Respiratory: Effort normal and breath sounds normal. She exhibits tenderness.  GI: Soft. Bowel sounds are normal. She exhibits no distension. There is no tenderness.  Musculoskeletal: She exhibits no edema or tenderness.  Neurological: She is alert and oriented to person, place, and time.  Able to follow simple commands.  Has poor initiation and lacks insight/ awareness of deficits.  DTRs symmetric Sensation intact to light touch Motor: B/L UE: 4+/5 proximal to distal B/LLE: Hip flexion 4/5, knee extension 4+/5, ankle dorsi/plantarflexion 4+/5  Skin: Skin is warm and dry. No rash noted.  Psychiatric: She has a normal mood and affect. Her behavior is normal.    Results for orders placed or performed during the hospital encounter of 08/24/15 (from the past 24 hour(s))  Glucose, capillary     Status: Abnormal   Collection Time: 09/02/15 11:17 AM  Result Value Ref Range   Glucose-Capillary 123 (H) 65 - 99 mg/dL   Comment 1 Notify RN    Comment 2 Document in Chart   Glucose, capillary     Status: None   Collection Time: 09/02/15  4:09 PM  Result Value Ref Range   Glucose-Capillary 99 65 - 99 mg/dL   Comment 1 Notify RN    Comment 2 Document in Chart   Glucose, capillary     Status: Abnormal   Collection Time: 09/02/15  9:10 PM  Result Value Ref Range   Glucose-Capillary 114 (H) 65 - 99 mg/dL  CBC     Status: Abnormal   Collection Time: 09/03/15  2:48 AM  Result Value Ref Range   WBC 14.1 (H) 4.0 - 10.5 K/uL   RBC 4.17 3.87 - 5.11 MIL/uL   Hemoglobin 11.9 (L) 12.0 - 15.0 g/dL   HCT 38.7 36.0 - 46.0 %   MCV 92.8 78.0 - 100.0 fL   MCH 28.5 26.0 - 34.0 pg   MCHC 30.7 30.0 - 36.0 g/dL   RDW 15.5 11.5 - 15.5 %   Platelets 553 (H) 150 - 400 K/uL  Basic metabolic panel     Status: Abnormal   Collection Time: 09/03/15   2:48 AM  Result Value Ref Range   Sodium 138 135 - 145 mmol/L   Potassium 4.5 3.5 - 5.1 mmol/L   Chloride 103 101 - 111 mmol/L   CO2 27 22 - 32 mmol/L   Glucose, Bld 99 65 - 99 mg/dL   BUN 26 (H) 6 - 20 mg/dL   Creatinine, Ser 0.89 0.44 - 1.00 mg/dL   Calcium 9.1 8.9 - 10.3 mg/dL   GFR calc non Af Amer >60 >60 mL/min   GFR calc Af Amer >60 >60 mL/min   Anion gap 8 5 - 15  Protime-INR     Status: None   Collection Time: 09/03/15  2:48 AM  Result Value Ref Range   Prothrombin Time 14.3 11.6 - 15.2 seconds   INR 1.09 0.00 - 1.49  Lipid panel     Status: Abnormal   Collection Time: 09/03/15  2:48 AM  Result Value Ref Range   Cholesterol 225 (H) 0 - 200 mg/dL   Triglycerides 333 (H) <150 mg/dL   HDL 43 >40 mg/dL   Total CHOL/HDL Ratio 5.2 RATIO   VLDL 67 (H) 0 - 40 mg/dL   LDL Cholesterol 115 (H) 0 - 99 mg/dL  Glucose, capillary     Status: None   Collection Time: 09/03/15  6:12 AM  Result Value  Ref Range   Glucose-Capillary 69 65 - 99 mg/dL  Glucose, capillary     Status: Abnormal   Collection Time: 09/03/15  6:48 AM  Result Value Ref Range   Glucose-Capillary 112 (H) 65 - 99 mg/dL   Dg Chest 2 View  09/01/2015  CLINICAL DATA:  Patient recently extubated, now with recurrent cough and shortness of breath. EXAM: CHEST  2 VIEW COMPARISON:  08/28/2015 and earlier. FINDINGS: Interval extubation and nasogastric tube removal. Cardiac silhouette markedly enlarged, unchanged. Interval resolution of pulmonary venous hypertension and pulmonary edema. Pulmonary vascularity now normal. Improved aeration in the left lower lobe, with only minimal patchy opacities persisting. Lungs otherwise clear. IMPRESSION: 1. Interval marked improvement in aeration in the left lower lobe since the examination 4 days ago, with only mild residual atelectasis and/or pneumonia. No acute cardiopulmonary disease otherwise. 2. Interval resolution of pulmonary venous hypertension and pulmonary edema. Electronically  Signed   By: Evangeline Dakin M.D.   On: 09/01/2015 15:55   Mr [name] Head Wo Contrast  09/02/2015  CLINICAL DATA:  CVA.  Cardiac arrest.  Abnormal MRI scan. EXAM: MRA HEAD WITHOUT CONTRAST TECHNIQUE: Angiographic images of the Circle of Willis were obtained using MRA technique without intravenous contrast. COMPARISON:  MRI brain 09/01/2015 FINDINGS: The internal carotid arteries are within normal limits from the high cervical segments through the ICA termini bilaterally. The A1 and M1 segments are within normal limits. There is mild irregularity within the proximal a 2 segments. There is an early bifurcation of the proximal right anterior cerebral artery, a normal variant. No definite anterior communicating artery is present. The MCA bifurcations are intact. The visualized ACA and MCA branch vessels are within normal limits. The vertebral arteries are codominant. The PICA origins are not visualized. The PICA origins are visualized and normal. Both posterior cerebral arteries originate from the basilar tip. There is mild attenuation of proximal PCA vessels bilaterally. The distal PCA branch vessels are intact. IMPRESSION: 1. Mild atherosclerotic irregularity without a significant proximal stenosis, aneurysm, or branch vessel occlusion to account for the patient's acute infarcts. Electronically Signed   By: San Morelle M.D.   On: 09/02/2015 18:12   Mr Brain Wo Contrast  09/01/2015  CLINICAL DATA:  Cardiac arrest, recovered after epinephrine injection. Progressive dyspnea. Evaluate anoxic brain injury. EXAM: MRI HEAD WITHOUT CONTRAST TECHNIQUE: Multiplanar, multiecho pulse sequences of the brain and surrounding structures were obtained without intravenous contrast. COMPARISON:  CT head August 24, 2015 FINDINGS: Scattered foci of reduced diffusion include LEFT cerebellum, RIGHT caudate head, RIGHT insula, RIGHT periatrial white matter, LEFT occipital lobe, RIGHT parietal lobe measuring up to 11 mm. Larger  areas demonstrate reduced diffusion. No susceptibility artifact to suggest hemorrhage. Additional patchy supratentorial white matter FLAIR T2 hyperintensities exclusive of aforementioned abnormality. Ventricles and sulci are normal for patient's age. No midline shift, mass effect or masses. No abnormal extra-axial fluid collections. Normal major intracranial vascular flow voids present at skull base. Ocular globes and orbital contents are normal. Moderate pan paranasal sinus mucosal thickening. Small bilateral mastoid effusions. No abnormal sellar expansion. No cerebellar tonsillar ectopia. No suspicious calvarial bone marrow signal. IMPRESSION: Scattered supra and infratentorial foci of acute ischemia measuring up to 11 mm, which may represent embolic phenomena, possible component of watershed ischemia. Mild chronic small vessel ischemic disease. Electronically Signed   By: Elon Alas M.D.   On: 09/01/2015 22:12    Assessment/Plan: Diagnosis: Cardiac arrest with hypoxic encephalopathy and embolic/watershed stroke Labs and images independently reviewed.  Records reviewed and summated above. Stroke: Continue secondary stroke prophylaxis and Risk Factor Modification listed below:   Antiplatelet therapy Blood Pressure Management:  Continue current medication with prn's with permisive HTN per primary team Statin Agent:   Diabetes management:  1. Does the need for close, 24 hr/day medical lobe supervision in concert with the patient's rehab needs make it unreasonable for this patient to be served in a less intensive setting? Potentially  2. Co-Morbidities requiring supervision/potential complications: TBI/splenectomy,  bipolar disorder (cont meds), HTN (monitor and provide prns in accordance with increased physical exertion and pain), leukocytosis (cont to monitor for signs and symptoms of infection, further workup if indicated), thrombocytosis (cont to monitor) 3. Due to safety, skin/wound care,  disease management, medication administration and patient education, does the patient require 24 hr/day rehab nursing? Yes 4. Does the patient require coordinated care of a physician, rehab nurse, PT (1-2 hrs/day, 5 days/week), OT (1-2 hrs/day, 5 days/week) and SLP (1-2 hrs/day, 5 days/week) to address physical and functional deficits in the context of the above medical diagnosis(es)? Potentially Addressing deficits in the following areas: balance, endurance, locomotion, strength, transferring, bathing, dressing, grooming, toileting, cognition, speech, language, swallowing and psychosocial support 5. Can the patient actively participate in an intensive therapy program of at least 3 hrs of therapy per day at least 5 days per week? Potentially 6. The potential for patient to make measurable gains while on inpatient rehab is excellent 7. Anticipated functional outcomes upon discharge from inpatient rehab are TBD  with PT, supervision and min assist with OT, modified independent and supervision with SLP. 8. Estimated rehab length of stay to reach the above functional goals is: 16-19 days. 9. Does the patient have adequate social supports and living environment to accommodate these discharge functional goals? Yes 10. Anticipated D/C setting: Home 11. Anticipated post D/C treatments: HH therapy and Home excercise program 12. Overall Rehab/Functional Prognosis: good  RECOMMENDATIONS: This patient's condition is appropriate for continued rehabilitative care in the following setting: Likely CIR after completion of medical workup, however, will await formal PT consult. Patient has agreed to participate in recommended program. Yes Note that insurance prior authorization may be required for reimbursement for recommended care.  Comment: Rehab Admissions Coordinator to follow up.  Delice Lesch, MD 09/03/2015

## 2015-09-03 NOTE — Progress Notes (Signed)
PROGRESS NOTE  LULA SCHLAU [id number] DOB: Aug 05, 1955 DOA: 08/24/2015 PCP: No primary care provider on file.  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 Delacruz is a 60 year old with history of bipolar disorder. Admitted after a witnessed cardiac arrest. The initial rhythm was likely A. fib, V. tach as the AED delivered shocks. She then went to PEA upon EMS arrival and pulse was recovered after Epi injection. later patient's rhythm converted into SVT. She was shocked and was converted to NSR at 360 joulesTime to ROSC is estimated at around 10 minutes. Patient arrived to the ER on 2/17 unresponsive. She was found to become progressively dyspnea, initial abg showed P/F ration of 193, co2 retention and ph 7.2, patient was intubated in the ED , stat icu and cardiology consult and admitted to icu. Hypotermia protocol initiated in the ED  Patient had been reportedly increasing her psychiatric medications to deal with her bipolar disease. She has prolonged QTC at 530. 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 Cardiac arrest thought due  to prolonged QTc from psych meds. Psych meds adjusted ,  s/p ICD on 09/03/2015 for secondary prevention of VF arrest. EP input appreciated.   Acute respiratory failure due to cardiac arrest required intubation from 2/17 to 2/21. Now on room air.  Mild cognitive impairment, mri brian to evaluate anoxic brain injury.   Scattered supra and infratentorial foci of acute ischemia measuring up to 11 mm, which may represent embolic phenomena, possible component of watershed ischemia. Mild chronic small vessel ischemic disease. 2/26Neurology input appreciated, recommended to order mra head without contrast, carotid doppler, TEE i have informed cardiology about neurologist request for TEE. 2/27 neurology input appreciated, no need of TEE. A1c/LE doppler pending, recommend asa 325 and statin.  Bipolar: psych input appreciated, meds changed to trileptal and continue effexor, family want update from psych in the next few days. i talked to psychiatry Dr Lenna Sciara today, he recommended continue current meds and outpatient psych follow up.  Hypokalemia, replace k  Acute kidney injury: cr normal at baseline, today 1.21, adequate urine output, renal dosing meds,  ua showed bacteriuria, urine culture pending.  HTN: on coreg/lisinopril  Dilated hypertensive cardiomyopathy/systolic and diastolic chf: currently euvolemic. Close monitor volume status. Patient received lasix while on vent. echo lvef [id number], grade i diastolic dysfunction.  Cough: lung exam clear, no edema, no fever, no leukocytosis, cxr 2 view showed improvement, start incentive spirometer, mucinex. Patient is newly started on lisinopril, side effect from lisinopril?   Obsesity; life style modification  Deconditioning: CIR   Code Status: full  Family Communication: patient and family  Disposition  Plan: CIR   Consultants:  Cardiology/EP  Critical care admit, transfer to [facility] on 2/24  Psychiatry  CIR(inpatient  rehab)  neurology  Procedures:  Intubation 2/17/extubation 2/21 Stat echo , stat cardiac cath showed Widely patent coronary arteries  Normal LVEDP  EEG on 2/17: This is an abnormal routine inpatient EEG, suggestive of encephalopathy, likely medication effect as patient is on sedation, although cannot exclude hypoxic ischemic encephalopathy based on the clinical history of post cardiac arrest. No evidence of electrographic seizures were seen. If clinically indicated, consider repeating the EEG when off of sedation. Clinical correlation is recommended .   Right IJ placed on 2/17  Hypothermia protocol from 2/17 to 2/19  Tube feed while on vent  ICD placement on 2/27  Antibiotics:  none   Objective: BP 156/71 mmHg  Pulse 64  Temp(Src) 97.6 F (36.4 C) (Oral)  Resp 18  Ht 5\' 7"  (1.702 m)  Wt 100.381 kg (221 lb 4.8 oz)  BMI 34.65 kg/m2  SpO2 98%  Intake/Output Summary (Last 24 hours) at 09/03/15 1855 Last data filed at 09/03/15 1751  Gross per 24 hour  Intake    360 ml  Output   1250 ml  Net   -890 ml   Filed Weights   09/01/15 0448 09/02/15 0419 09/03/15 0405  Weight: 100.653 kg (221 lb 14.4 oz) 100.336 kg (221 lb 3.2 oz) 100.381 kg (221 lb 4.8 oz)    Exam:   General:  NAD,   Cardiovascular: RRR  Respiratory: CTABL  Abdomen: Soft/ND/NT, positive BS  Musculoskeletal: No Edema  Neuro: mild cognitive impairment, no focal deficit, aaox3, still poor short term memory  Data Reviewed: Basic Metabolic Panel:  Recent Labs Lab 08/28/15 0445  08/29/15 0515 08/30/15 0615 08/31/15 0300 09/01/15 0427 09/02/15 0349 09/03/15 0248  NA  --   < > 145 139 138 139 143 138  K  --   < > 3.2* 3.0* 3.2* 4.3 4.5 4.5  CL  --   < > 100* 101 102 108 105 103  CO2  --   < > 27 26 24 25 26 27   GLUCOSE  --   < > 134* 118* 86 96 101* 99  BUN  --   < > 39* 50* 57* 37* 32* 26*  CREATININE  --   < > 1.21* 1.26* 1.21* 1.04* 1.10* 0.89  CALCIUM  --   < > 9.2 9.1 9.2 8.9 9.1  9.1  MG 1.8  --  2.1 2.1 2.4  --   --   --   PHOS 2.3*  --  4.7* 4.8* 4.1  --   --   --   < > = values in this interval not displayed. Liver Function Tests:  Recent Labs Lab 09/02/15 0349  AST 26  ALT 42  ALKPHOS 94  BILITOT 0.3  PROT 5.7*  ALBUMIN 2.5*   No results for input(s): LIPASE, AMYLASE in the last 168 hours. No results for input(s): AMMONIA in the last 168 hours. CBC:  Recent Labs Lab 08/29/15 0515 08/30/15 0615 08/31/15 0300 09/01/15 0427 09/02/15 0349 09/03/15 0248  WBC 16.7* 13.7* 13.3* 12.8* 13.6* 14.1*  NEUTROABS 15.0* 11.6* 8.8* 8.5*  --   --   HGB 12.3 12.8 12.8 12.9 11.8* 11.9*  HCT 38.4 39.5 39.6 39.9 37.9 38.7  MCV 90.6 89.2 91.2 91.1 92.0 92.8  PLT 473* 516* 450* 489* 516* 553*   Cardiac Enzymes:   No results for input(s): CKTOTAL, CKMB, CKMBINDEX, TROPONINI  in the last 168 hours. BNP (last 3 results) No results for input(s): BNP in the last 8760 hours.  ProBNP (last 3 results) No results for input(s): PROBNP in the last 8760 hours.  CBG:  Recent Labs Lab 09/02/15 2110 09/03/15 0612 09/03/15 0648 09/03/15 1648 09/03/15 1838  GLUCAP 114* 69 112* 60* 123*    Recent Results (from the past 240 hour(s))  MRSA PCR Screening     Status: None   Collection Time: 08/25/15 12:31 PM  Result Value Ref Range Status   MRSA by PCR NEGATIVE NEGATIVE Final    Comment:        The GeneXpert MRSA Assay (FDA approved for NASAL specimens only), is one component of a comprehensive MRSA colonization surveillance program. It is not intended to diagnose MRSA infection nor to guide or monitor treatment for MRSA infections.   Culture, Urine     Status: None (Preliminary result)   Collection Time: 09/01/15  7:55 PM  Result Value Ref Range Status   Specimen Description URINE, RANDOM  Final   Special Requests NONE  Final   Culture TOO YOUNG TO READ  Final   Report Status PENDING  Incomplete     Studies: No results found.  Scheduled Meds: .  antiseptic oral rinse  7 mL Mouth Rinse q12n4p  . atorvastatin  20 mg Oral q1800  . carvedilol  6.25 mg Oral BID WC  . chlorhexidine gluconate  15 mL Mouth Rinse BID  . dextromethorphan-guaiFENesin  1 tablet Oral BID  . insulin aspart  2-6 Units Subcutaneous 6 times per day  . lisinopril  20 mg Oral Daily  . OXcarbazepine  300 mg Oral BID  . pantoprazole  40 mg Oral Daily  . sodium chloride flush  10-40 mL Intracatheter Q12H  . sodium chloride flush  3 mL Intravenous Q12H  . venlafaxine XR  75 mg Oral Daily    Continuous Infusions:    Time spent: 7mins  Amaury Kuzel MD, PhD  [facility] Pager [phone]. If 7PM-7AM, please contact night-coverage at [email or web address], password [facility] 09/03/2015, 6:55 PM  LOS: 10 days

## 2015-09-03 NOTE — Interval H&P Note (Signed)
History and Physical Interval Note:  09/03/2015 10:07 AM  Marie Ramos  has presented today for surgery, with the diagnosis of cardiac arrest  The various methods of treatment have been discussed with the patient and family. After consideration of risks, benefits and other options for treatment, the patient has consented to  Procedure(s): ICD Implant (N/A) as a surgical intervention .  The patient's history has been reviewed, patient examined, no change in status, stable for surgery.  I have reviewed the patient's chart and labs.  Questions were answered to the patient's satisfaction.     ICD Criteria  Current LVEF:40%. Within 12 months prior to implant: Yes   Heart failure history: No  Cardiomyopathy history: No.  Atrial Fibrillation/Atrial Flutter: No.  Ventricular tachycardia history: Yes, Hemodynamic instability present. VT Type: Sustained Ventricular Tachycardia - Polymorphic.  Cardiac arrest history: Yes, Ventricular Fibrillation.  History of syndromes with risk of sudden death: Yes, Long QT Syndrome  Previous ICD: No.  Current ICD indication: Secondary  PPM indication: Yes. Pacing type: Atrial. Greater than 40% RV pacing requirement anticipated. Indication: Sick Sinus Syndrome  Sinus bradycardia noted.  Given prolonged QT, will place atrial lead to allow for beta blocker titration and atrial pacing.  In addition, given recent stroke, will use atrial lead to monitor for atrial fibrillation  Beta Blocker therapy for 3 or more months: no , new diagnosis with associated VF Ace Inhibitor/ARB therapy for 3 or more months: No, patient reason. new diagnosis with associated VF     Thompson Grayer

## 2015-09-03 NOTE — Progress Notes (Signed)
Report given to [facility] on [address].  Pt able to ask for bedpan, and drink fluids without any problems.  Pt arousable upon voice.

## 2015-09-03 NOTE — H&P (View-Only) (Signed)
SUBJECTIVE: The patient is doing well today.  At this time, she denies chest pain, shortness of breath, or any new concerns.  CURRENT MEDICATIONS: . antiseptic oral rinse  7 mL Mouth Rinse q12n4p  . carvedilol  6.25 mg Oral BID WC  . chlorhexidine gluconate  15 mL Mouth Rinse BID  . dextromethorphan-guaiFENesin  1 tablet Oral BID  . heparin  5,000 Units Subcutaneous 3 times per day  . insulin aspart  2-6 Units Subcutaneous 6 times per day  . lisinopril  10 mg Oral Daily  . OXcarbazepine  300 mg Oral BID  . pantoprazole  40 mg Oral Daily  . senna-docusate  2 tablet Oral BID  . sodium chloride flush  10-40 mL Intracatheter Q12H  . sodium chloride flush  3 mL Intravenous Q12H  . venlafaxine XR  75 mg Oral Daily      OBJECTIVE: Physical Exam: Filed Vitals:   08/31/15 0500 08/31/15 1455 08/31/15 1944 09/01/15 0448  BP:  120/76 118/76 157/86  Pulse:  65 59 58  Temp:  98.7 F (37.1 C) 98 F (36.7 C) 97.5 F (36.4 C)  TempSrc:  Oral Oral Oral  Resp:  20 18 18   Height:      Weight: 216 lb 14.4 oz (98.385 kg)   221 lb 14.4 oz (100.653 kg)  SpO2:  95% 98% 94%    Intake/Output Summary (Last 24 hours) at 09/01/15 [id number] Last data filed at 09/01/15 0449  Gross per 24 hour  Intake    480 ml  Output    600 ml  Net   -120 ml    Telemetry reveals sinus rhythm, occasional PVC's  GEN- The patient is less ill appearing, alert and oriented Head- normocephalic, atraumatic Eyes-  Sclera clear, conjunctiva pink Ears- hearing intact Oropharynx- clear Neck- supple Lungs- Clear to ausculation bilaterally, normal work of breathing Heart- Regular rate and rhythm,  GI- soft, NT, ND, + BS Extremities- no clubbing, cyanosis, or edema Skin- no rash or lesion Psych- depressed mood, flat affect Neuro- strength and sensation are intact  LABS: Basic Metabolic Panel:  Recent Labs  08/30/15 0615 08/31/15 0300 09/01/15 0427  NA 139 138 139  K 3.0* 3.2* 4.3  CL 101 102 108  CO2 26 24  25   GLUCOSE 118* 86 96  BUN 50* 57* 37*  CREATININE 1.26* 1.21* 1.04*  CALCIUM 9.1 9.2 8.9  MG 2.1 2.4  --   PHOS 4.8* 4.1  --    CBC:  Recent Labs  08/31/15 0300 09/01/15 0427  WBC 13.3* 12.8*  NEUTROABS 8.8* 8.5*  HGB 12.8 12.9  HCT 39.6 39.9  MCV 91.2 91.1  PLT 450* 489*    RADIOLOGY: Dg Chest Port 1 View 08/28/2015  CLINICAL DATA:  Endotracheal tube position EXAM: PORTABLE CHEST 1 VIEW COMPARISON:  08/27/2015 FINDINGS: Endotracheal tube is in good position approximately 3 cm above the carina. NG tube in place with the tip not visualized. Right jugular catheter tip at the cavoatrial junction. No pneumothorax Improvement in bibasilar airspace disease compared with the prior study. There remains left greater than right bibasilar airspace disease. No significant pleural effusion. IMPRESSION: Endotracheal tube now in good position Improved aeration with decrease in bibasilar airspace disease compared with yesterday Electronically Signed   By: Franchot Gallo M.D.   On: 08/28/2015 07:12    ASSESSMENT AND PLAN:  Active Problems:   Cardiac arrest ([place])   Acute respiratory failure (HCC)   Hypertrophic obstructive cardiomyopathy ([place])  QT prolongation   Hypokalemia   Essential hypertension   Systolic dysfunction with acute on chronic heart failure ([place])  1. VF/PEA arrest The patient had an OOH resuscitated cardiac arrest presumed to be VF with AED advising shocks. She received immediate bystander CPR. Cath this admisssion with no CAD. Echo suggestive of possible nonobstructive hypertrophic CM and EF 40% (possibly due to myocardial stunning). MRI reveals hypertensive rather than hypertrophic CM with EF 45% Keep K >3.9, Mg >1.8 Avoid QT prolonging medications Repeat ekg at this time to assess QT now that electrolytes are improved, psych medicines are washed out, and she has been fully rewarmed for severa days Given structural heart disease and VF arrest, I would advise ICD  implantation for secondary prevention.  Risks, benefits, alternatives to ICD implantation were discussed in detail with the patient today. The patient  understands that the risks include but are not limited to bleeding, infection, pneumothorax, perforation, tamponade, vascular damage, renal failure, MI, stroke, death, inappropriate shocks, and lead dislodgement and wishes to proceed.  I have tentatively placed her on the schedule for Monday.    * Please stop heparin prophylaxis on Sunday and make NPO after midnight Sunday night.  2. Acute systolic dysfunction Cardiac MRI suggests hypertensive CM Continue Coreg, ACE-I titration over the weekend if HR/BP allow Remains dry Oral hydration encouraged  3. Psych Appreciate input Need to avoid QT prolonging drugs   4. Hypertension Stable No change required today   Thompson Grayer, MD 09/01/2015 8:03 AM

## 2015-09-03 NOTE — Progress Notes (Signed)
Pt very sedate from procedure.  LOC slowly rising but too sleepy to return to floor.  Continued to monitor V/S amd LOC.

## 2015-09-04 ENCOUNTER — Inpatient Hospital Stay (HOSPITAL_COMMUNITY): Payer: BLUE CROSS/BLUE SHIELD

## 2015-09-04 DIAGNOSIS — I639 Cerebral infarction, unspecified: Secondary | ICD-10-CM

## 2015-09-04 DIAGNOSIS — I82402 Acute embolism and thrombosis of unspecified deep veins of left lower extremity: Secondary | ICD-10-CM

## 2015-09-04 LAB — CBC
HEMATOCRIT: 38.8 % (ref 36.0–46.0)
HEMATOCRIT: 36.9 % (ref 36.0–46.0)
HEMOGLOBIN: 12.1 g/dL (ref 12.0–15.0)
HEMOGLOBIN: 11.9 g/dL — AB (ref 12.0–15.0)
MCH: 28.7 pg (ref 26.0–34.0)
MCH: 29.7 pg (ref 26.0–34.0)
MCHC: 31.2 g/dL (ref 30.0–36.0)
MCHC: 32.2 g/dL (ref 30.0–36.0)
MCV: 92.2 fL (ref 78.0–100.0)
MCV: 92 fL (ref 78.0–100.0)
Platelets: 539 10*3/uL — ABNORMAL HIGH (ref 150–400)
Platelets: 514 10*3/uL — ABNORMAL HIGH (ref 150–400)
RBC: 4.21 MIL/uL (ref 3.87–5.11)
RBC: 4.01 MIL/uL (ref 3.87–5.11)
RDW: 15.5 % (ref 11.5–15.5)
RDW: 15.2 % (ref 11.5–15.5)
WBC: 17.2 10*3/uL — ABNORMAL HIGH (ref 4.0–10.5)
WBC: 16.5 10*3/uL — ABNORMAL HIGH (ref 4.0–10.5)

## 2015-09-04 LAB — BASIC METABOLIC PANEL
ANION GAP: 8 (ref 5–15)
Anion gap: 10 (ref 5–15)
BUN: 30 mg/dL — AB (ref 6–20)
BUN: 26 mg/dL — ABNORMAL HIGH (ref 6–20)
CALCIUM: 9.1 mg/dL (ref 8.9–10.3)
CALCIUM: 8.9 mg/dL (ref 8.9–10.3)
CHLORIDE: 103 mmol/L (ref 101–111)
CHLORIDE: 102 mmol/L (ref 101–111)
CO2: 26 mmol/L (ref 22–32)
CO2: 27 mmol/L (ref 22–32)
CREATININE: 0.95 mg/dL (ref 0.44–1.00)
Creatinine, Ser: 0.99 mg/dL (ref 0.44–1.00)
GFR calc Af Amer: >60 mL/min (ref >60)
GFR calc Af Amer: >60 mL/min (ref >60)
GFR calc non Af Amer: >60 mL/min (ref >60)
GFR calc non Af Amer: >60 mL/min (ref >60)
GLUCOSE: 97 mg/dL (ref 65–99)
GLUCOSE: 137 mg/dL — AB (ref 65–99)
Potassium: 5.2 mmol/L — ABNORMAL HIGH (ref 3.5–5.1)
Potassium: 4.1 mmol/L (ref 3.5–5.1)
Sodium: 139 mmol/L (ref 135–145)
Sodium: 137 mmol/L (ref 135–145)

## 2015-09-04 LAB — GLUCOSE, CAPILLARY
GLUCOSE-CAPILLARY: 126 mg/dL — AB (ref 65–99)
GLUCOSE-CAPILLARY: 114 mg/dL — AB (ref 65–99)
GLUCOSE-CAPILLARY: 180 mg/dL — AB (ref 65–99)
Glucose-Capillary: 110 mg/dL — ABNORMAL HIGH (ref 65–99)
Glucose-Capillary: 113 mg/dL — ABNORMAL HIGH (ref 65–99)
Glucose-Capillary: 148 mg/dL — ABNORMAL HIGH (ref 65–99)

## 2015-09-04 LAB — HEMOGLOBIN A1C
HEMOGLOBIN A1C: 6.2 % — AB (ref 4.8–5.6)
MEAN PLASMA GLUCOSE: 131 mg/dL

## 2015-09-04 MED ORDER — DEXTROSE 5 % IV SOLN
1.0000 g | INTRAVENOUS | Status: DC
  Administered 2015-09-04: 1 g via INTRAVENOUS
  Filled 2015-09-04 (×2): qty 10

## 2015-09-04 MED ORDER — APIXABAN 5 MG PO TABS: 5.0000 mg | ORAL_TABLET | Freq: Two times a day (BID) | ORAL | Status: DC

## 2015-09-04 MED ORDER — CEFAZOLIN SODIUM 1-5 GM-% IV SOLN
1.0000 g | Freq: Four times a day (QID) | INTRAVENOUS | Status: DC
  Filled 2015-09-04 (×3): qty 50

## 2015-09-04 MED ORDER — SODIUM CHLORIDE 0.9 % IV SOLN: 250.0000 mL | INTRAVENOUS | Status: DC | PRN

## 2015-09-04 MED ORDER — CEFAZOLIN SODIUM-DEXTROSE 2-3 GM-% IV SOLR
2.0000 g | Freq: Once | INTRAVENOUS | Status: AC
  Administered 2015-09-04: 2 g via INTRAVENOUS
  Filled 2015-09-04: qty 50

## 2015-09-04 MED ORDER — SODIUM CHLORIDE 0.9% FLUSH: 3.0000 mL | INTRAVENOUS | Status: DC | PRN

## 2015-09-04 MED ORDER — ONDANSETRON HCL 4 MG/2ML IJ SOLN: 4.0000 mg | Freq: Four times a day (QID) | INTRAMUSCULAR | Status: DC | PRN

## 2015-09-04 MED ORDER — ACETAMINOPHEN 325 MG PO TABS
325.0000 mg | ORAL_TABLET | ORAL | Status: DC | PRN
  Administered 2015-09-04 – 2015-09-05 (×5): 650 mg via ORAL
  Filled 2015-09-04 (×6): qty 2

## 2015-09-04 MED ORDER — FAMOTIDINE 20 MG PO TABS
20.0000 mg | ORAL_TABLET | Freq: Every day | ORAL | Status: DC
  Administered 2015-09-04 – 2015-09-05 (×2): 20 mg via ORAL
  Filled 2015-09-04 (×2): qty 1

## 2015-09-04 MED ORDER — SODIUM CHLORIDE 0.9% FLUSH
3.0000 mL | Freq: Two times a day (BID) | INTRAVENOUS | Status: DC
  Administered 2015-09-04 (×2): 3 mL via INTRAVENOUS

## 2015-09-04 MED ORDER — APIXABAN 5 MG PO TABS
10.0000 mg | ORAL_TABLET | Freq: Two times a day (BID) | ORAL | Status: DC
  Administered 2015-09-04 – 2015-09-05 (×3): 10 mg via ORAL
  Filled 2015-09-04 (×3): qty 2

## 2015-09-04 MED FILL — Heparin Sodium (Porcine) Inj 1000 Unit/ML: INTRAMUSCULAR | Qty: 10 | Status: AC

## 2015-09-04 NOTE — Progress Notes (Signed)
*  PRELIMINARY RESULTS* Vascular Ultrasound Transcranial Doppler with Bubbles has been completed with Dr. Erlinda Hong. There is no obvious evidence of High Intensity Transient Signals (HITS) at rest or with (suboptimal) valsalva maneuver.  09/04/2015 3:36 PM Maudry Mayhew, RVT, RDCS, RDMS

## 2015-09-04 NOTE — H&P (Signed)
Physical Medicine and Rehabilitation Admission H&P    Chief Complaint  Patient presents with  . Anoxic BI with embolic/watershed stroke     HPI: Marie Ramos is a 60 y.o. female with history of fall with TBI/splenectomy, bipolar disorder with recent increase in psychiatric medications who was admitted on 08/24/15 after witnessed cardiac arrest. Patient unresponsive and pulseless and AED placed by school nurse with 2 shocks and CPR initiated. EMS evaluation showed PEA and ROSC after epinephrine--down time 10 minutes. Initial rhythm presumed to be VT. Patient intubated and cardiac cath was negative for CAD. She was treated with hypothermia protocol and arrest felt to be due to QT elongation. 2D echo with asymmetric septal hypertrophic CM without obstruction. Cardiac MRI showed hypertensive rather than HCM with EF 45%. She was extubated without difficulty on 02/23 and Psych consulted for medication adjustment and seroquel and adderral discontinued. Trileptal added to help with mood stabilization and high levels of anxiety managed with prn Xanax. Neurology consulted due to confusion and MRI brain done revealing scattered supra and infratentorial foci of acute ischemia measuring up to 11 mm-- question embolic phenomena and possible component of watershed ischemia. MRA without significant stenosis or aneurysm. Dr. Erlinda Hong recommended ASA and statin for strokes due to SVD v/s embolic due to cardiac cath. Acute systolic dysfunction treated with coreg and ACE titration and ICD to be placed on 02/27. BLE dopplers done yesterday and revealed left posterior tibial and left peroneal veins DVT therefore patient started on Eliquis. Therapy initiated and patient noted to have impaired cognition with generalized weakness, SOB as well as decreased balance. CIR recommended for follow up therapy   Review of Systems  HENT: Negative for hearing loss.   Eyes: Negative for blurred vision and double vision.    Respiratory: Positive for cough (dry). Negative for sputum production and shortness of breath.   Cardiovascular: Positive for chest pain. Negative for claudication and leg swelling.  Gastrointestinal: Negative for heartburn, nausea and constipation.  Genitourinary: Negative for dysuria and urgency.  Musculoskeletal: Negative for myalgias and back pain.  Skin: Negative for rash.  Neurological: Positive for dizziness (with ambulation) and weakness. Negative for sensory change, focal weakness and headaches.  Psychiatric/Behavioral: Positive for memory loss. The patient is nervous/anxious.   All other systems reviewed and are negative.    Past Medical History  Diagnosis Date  . Bipolar 1 disorder ([place])   . Hypertrophic obstructive cardiomyopathy ([place]) 08/29/2015  . Cardiac arrest ([place]) 08/24/2015  . QT prolongation 08/29/2015  . TBI (traumatic brain injury) [facility]) 2011    Inpatient rehab [facility]  . Macular degeneration   . Chronically dry eyes   . Ventricular fibrillation ([place]) 09/03/15    MDT ICD Dr. Rayann Heman    Past Surgical History  Procedure Laterality Date  . Cardiac catheterization N/A 08/24/2015    Procedure: Left Heart Cath and Coronary Angiography;  Surgeon: Sherren Mocha, MD;  Location: [facility];  Service: Cardiovascular;  Laterality: N/A;  . Abdominal hysterectomy    . Tonsillectomy    . Splenectomy, total  2011  . Ep implantable device N/A 09/03/2015    MDT dual chamber ICD    Family History  Problem Relation Age of Onset  . Cancer Mother   . COPD Father     Social History: Married. Husband retired but works prn. She was working full time as a Oceanographer. She reports that she has never smoked. She has never used smokeless tobacco. She reports  that she does not drink alcohol or use illicit drugs.    Allergies  Allergen Reactions  . Naproxen Itching  . Vicodin [Hydrocodone-Acetaminophen] Itching    Medications Prior to Admission  Medication Sig  Dispense Refill  . venlafaxine XR (EFFEXOR-XR) 75 MG 24 hr capsule Take 75 mg by mouth daily.  4  . [DISCONTINUED] amphetamine-dextroamphetamine (ADDERALL) 20 MG tablet Take 20 mg by mouth 3 (three) times daily.   0  . [DISCONTINUED] fexofenadine (ALLEGRA) 60 MG tablet Take 60 mg by mouth daily.    . [DISCONTINUED] QUEtiapine (SEROQUEL) 100 MG tablet Take 100 mg by mouth at bedtime.  4  . [DISCONTINUED] topiramate (TOPAMAX) 100 MG tablet Take 100 mg by mouth at bedtime.  3    Home: Home Living Family/patient expects to be discharged to:: Private residence Living Arrangements: Spouse/significant other Available Help at Discharge: Family Type of Home: House Home Access: Level entry [place]: Two level Alternate Level Stairs-Number of Steps: 12 Alternate Level Stairs-Rails: Can reach both Home Equipment: None Additional Comments: pt unable to recall bathroom set up, no family in room, pt is a Oceanographer and likes to bake   Functional History: Prior Function Level of Independence: Independent  Functional Status:  Mobility: Bed Mobility Overal bed mobility: Needs Assistance Bed Mobility: Supine to Sit Supine to sit: Mod assist General bed mobility comments: pt in chair Transfers Overall transfer level: Needs assistance Equipment used: Rolling walker (2 wheeled), None Transfers: Sit to/from Stand, Stand Pivot Transfers Sit to Stand: Min assist Stand pivot transfers: Mod assist General transfer comment: assist to rise and for UE support for balance Ambulation/Gait Ambulation/Gait assistance: Min assist Ambulation Distance (Feet): 50 Feet (+ 50') Assistive device: Rolling walker (2 wheeled) Gait Pattern/deviations: Step-through pattern, Decreased stride length, Staggering left, Staggering right, Drifts right/left, Trunk flexed General Gait Details: Impulsive, unsteady gait with staggering noted to left/right. + dizziness. Bumping into walls/obstacles in hallway. Easily  distracted. 1 seated rest break due to dizziness forced by therapist. Gait velocity interpretation: at or above normal speed for age/gender    ADL: ADL Overall ADL's : Needs assistance/impaired Eating/Feeding: Minimal assistance, Sitting Eating/Feeding Details (indicate cue type and reason): assist to set up tray, cut food, open containers Grooming: Wash/dry hands, Sitting, Minimal assistance Grooming Details (indicate cue type and reason): decreased task persistence Upper Body Bathing: Maximal assistance, Sitting Lower Body Bathing: Maximal assistance, Sit to/from stand Upper Body Dressing : Sitting, Moderate assistance Lower Body Dressing: Maximal assistance, Sit to/from stand Toilet Transfer: Moderate assistance, BSC, Stand-pivot Toileting- Clothing Manipulation and Hygiene: Sit to/from stand, Moderate assistance  Cognition: Cognition Overall Cognitive Status: Impaired/Different from baseline Orientation Level: Oriented X4 Cognition Arousal/Alertness: Awake/alert Behavior During Therapy: WFL for tasks assessed/performed Overall Cognitive Status: Impaired/Different from baseline Area of Impairment: Safety/judgement Orientation Level: Disoriented to, Situation, Time Current Attention Level: Focused Memory: Decreased short-term memory Following Commands: Follows one step commands with increased time Safety/Judgement: Decreased awareness of safety, Decreased awareness of deficits Awareness: Intellectual Problem Solving: Slow processing, Difficulty sequencing, Requires verbal cues, Requires tactile cues, Decreased initiation General Comments: Requires repetition of cues and manual cues at times to follow commands.    Blood pressure 133/54, pulse 77, temperature 98.7 F (37.1 C), temperature source Oral, resp. rate 18, height _0  (1.702 m), weight 100.245 kg (221 lb), SpO2 99 %. Physical Exam Constitutional: She is oriented to person, place, and time. She appears well-developed  and well-nourished.  HENT:  Head: Normocephalic and atraumatic.  Mouth/Throat: Oropharynx is  clear and moist.  Eyes: Conjunctivae and EOM are normal. Pupils are equal, round, and reactive to light.  Neck: Normal range of motion. Neck supple. No tracheal deviation present. No thyromegaly present.  Cardiovascular: Normal rate and regular rhythm.  No murmur heard. Respiratory: Effort normal and breath sounds normal.  GI: Soft. Bowel sounds are normal. She exhibits no distension. There is no tenderness.  Musculoskeletal: She exhibits no edema or tenderness.  Neurological: She is alert and oriented to person, place, and time.  Able to follow simple commands.  DTRs symmetric Sensation mildly diminished LUE/LLE Motor:  RUE: 4+/5 proximal to distal LUE: 4/5 proximal to distal LLE: Hip flexion 4/5, knee extension 4+/5, ankle dorsi/plantarflexion 5/5  RLE: Hip flexion 4+/5, knee extension 4+/5, ankle dorsi/plantarflexion 5/5  Skin: Skin is warm and dry. No rash noted.  Psychiatric: She has a normal mood and affect. Her behavior is normal.    Results for orders placed or performed during the hospital encounter of 08/24/15 (from the past 48 hour(s))  Glucose, capillary     Status: None   Collection Time: 09/02/15  4:09 PM  Result Value Ref Range   Glucose-Capillary 99 65 - 99 mg/dL   Comment 1 Notify RN    Comment 2 Document in Chart   Glucose, capillary     Status: Abnormal   Collection Time: 09/02/15  9:10 PM  Result Value Ref Range   Glucose-Capillary 114 (H) 65 - 99 mg/dL  CBC     Status: Abnormal   Collection Time: 09/03/15  2:48 AM  Result Value Ref Range   WBC 14.1 (H) 4.0 - 10.5 K/uL   RBC 4.17 3.87 - 5.11 MIL/uL   Hemoglobin 11.9 (L) 12.0 - 15.0 g/dL   HCT 38.7 36.0 - 46.0 %   MCV 92.8 78.0 - 100.0 fL   MCH 28.5 26.0 - 34.0 pg   MCHC 30.7 30.0 - 36.0 g/dL   RDW 15.5 11.5 - 15.5 %   Platelets 553 (H) 150 - 400 K/uL  Basic metabolic panel     Status: Abnormal    Collection Time: 09/03/15  2:48 AM  Result Value Ref Range   Sodium 138 135 - 145 mmol/L   Potassium 4.5 3.5 - 5.1 mmol/L   Chloride 103 101 - 111 mmol/L   CO2 27 22 - 32 mmol/L   Glucose, Bld 99 65 - 99 mg/dL   BUN 26 (H) 6 - 20 mg/dL   Creatinine, Ser 0.89 0.44 - 1.00 mg/dL   Calcium 9.1 8.9 - 10.3 mg/dL   GFR calc non Af Amer >60 >60 mL/min   GFR calc Af Amer >60 >60 mL/min    Comment: (NOTE) The eGFR has been calculated using the CKD EPI equation. This calculation has not been validated in all clinical situations. eGFR's persistently <60 mL/min signify possible Chronic Kidney Disease.    Anion gap 8 5 - 15  Hemoglobin A1c     Status: Abnormal   Collection Time: 09/03/15  2:48 AM  Result Value Ref Range   Hgb A1c MFr Bld 6.2 (H) 4.8 - 5.6 %    Comment: (NOTE)         Pre-diabetes: 5.7 - 6.4         Diabetes: >6.4         Glycemic control for adults with diabetes: <7.0    Mean Plasma Glucose 131 mg/dL    Comment: (NOTE) Performed At: [facility] [address] [place], [place] [id number]  Lindon Romp MD ES:[id number]   Protime-INR     Status: None   Collection Time: 09/03/15  2:48 AM  Result Value Ref Range   Prothrombin Time 14.3 11.6 - 15.2 seconds   INR 1.09 0.00 - 1.49  Lipid panel     Status: Abnormal   Collection Time: 09/03/15  2:48 AM  Result Value Ref Range   Cholesterol 225 (H) 0 - 200 mg/dL   Triglycerides 333 (H) <150 mg/dL   HDL 43 >40 mg/dL   Total CHOL/HDL Ratio 5.2 RATIO   VLDL 67 (H) 0 - 40 mg/dL   LDL Cholesterol 115 (H) 0 - 99 mg/dL    Comment:        Total Cholesterol/HDL:CHD Risk Coronary Heart Disease Risk Table                     Men   Women  1/2 Average Risk   3.4   3.3  Average Risk       5.0   4.4  2 X Average Risk   9.6   7.1  3 X Average Risk  23.4   11.0        Use the calculated Patient Ratio above and the CHD Risk Table to determine the patient's CHD Risk.        ATP III CLASSIFICATION (LDL):  <100     mg/dL    Optimal  100-129  mg/dL   Near or Above                    Optimal  130-159  mg/dL   Borderline  160-189  mg/dL   High  >190     mg/dL   Very High   Glucose, capillary     Status: None   Collection Time: 09/03/15  6:12 AM  Result Value Ref Range   Glucose-Capillary 69 65 - 99 mg/dL  Glucose, capillary     Status: Abnormal   Collection Time: 09/03/15  6:48 AM  Result Value Ref Range   Glucose-Capillary 112 (H) 65 - 99 mg/dL  Glucose, capillary     Status: Abnormal   Collection Time: 09/03/15  4:48 PM  Result Value Ref Range   Glucose-Capillary 60 (L) 65 - 99 mg/dL   Comment 1 Notify RN    Comment 2 Document in Chart   Glucose, capillary     Status: Abnormal   Collection Time: 09/03/15  6:38 PM  Result Value Ref Range   Glucose-Capillary 123 (H) 65 - 99 mg/dL   Comment 1 Notify RN    Comment 2 Document in Chart   Glucose, capillary     Status: Abnormal   Collection Time: 09/03/15  8:59 PM  Result Value Ref Range   Glucose-Capillary 151 (H) 65 - 99 mg/dL   Comment 1 Notify RN    Comment 2 Document in Chart   Glucose, capillary     Status: Abnormal   Collection Time: 09/04/15 12:54 AM  Result Value Ref Range   Glucose-Capillary 110 (H) 65 - 99 mg/dL  CBC     Status: Abnormal   Collection Time: 09/04/15  4:55 AM  Result Value Ref Range   WBC 17.2 (H) 4.0 - 10.5 K/uL   RBC 4.21 3.87 - 5.11 MIL/uL   Hemoglobin 12.1 12.0 - 15.0 g/dL   HCT 38.8 36.0 - 46.0 %   MCV 92.2 78.0 - 100.0 fL   MCH 28.7 26.0 - 34.0 pg  MCHC 31.2 30.0 - 36.0 g/dL   RDW 15.5 11.5 - 15.5 %   Platelets 539 (H) 150 - 400 K/uL  Basic metabolic panel     Status: Abnormal   Collection Time: 09/04/15  4:55 AM  Result Value Ref Range   Sodium 139 135 - 145 mmol/L   Potassium 5.2 (H) 3.5 - 5.1 mmol/L   Chloride 103 101 - 111 mmol/L   CO2 26 22 - 32 mmol/L   Glucose, Bld 97 65 - 99 mg/dL   BUN 30 (H) 6 - 20 mg/dL   Creatinine, Ser 0.95 0.44 - 1.00 mg/dL   Calcium 9.1 8.9 - 10.3 mg/dL   GFR calc non  Af Amer >60 >60 mL/min   GFR calc Af Amer >60 >60 mL/min    Comment: (NOTE) The eGFR has been calculated using the CKD EPI equation. This calculation has not been validated in all clinical situations. eGFR's persistently <60 mL/min signify possible Chronic Kidney Disease.    Anion gap 10 5 - 15  Glucose, capillary     Status: Abnormal   Collection Time: 09/04/15  5:04 AM  Result Value Ref Range   Glucose-Capillary 113 (H) 65 - 99 mg/dL  Glucose, capillary     Status: Abnormal   Collection Time: 09/04/15  5:47 AM  Result Value Ref Range   Glucose-Capillary 148 (H) 65 - 99 mg/dL  Basic metabolic panel     Status: Abnormal   Collection Time: 09/04/15 10:25 AM  Result Value Ref Range   Sodium 137 135 - 145 mmol/L   Potassium 4.1 3.5 - 5.1 mmol/L   Chloride 102 101 - 111 mmol/L   CO2 27 22 - 32 mmol/L   Glucose, Bld 137 (H) 65 - 99 mg/dL   BUN 26 (H) 6 - 20 mg/dL   Creatinine, Ser 0.99 0.44 - 1.00 mg/dL   Calcium 8.9 8.9 - 10.3 mg/dL   GFR calc non Af Amer >60 >60 mL/min   GFR calc Af Amer >60 >60 mL/min    Comment: (NOTE) The eGFR has been calculated using the CKD EPI equation. This calculation has not been validated in all clinical situations. eGFR's persistently <60 mL/min signify possible Chronic Kidney Disease.    Anion gap 8 5 - 15  CBC     Status: Abnormal   Collection Time: 09/04/15 10:25 AM  Result Value Ref Range   WBC 16.5 (H) 4.0 - 10.5 K/uL   RBC 4.01 3.87 - 5.11 MIL/uL   Hemoglobin 11.9 (L) 12.0 - 15.0 g/dL   HCT 36.9 36.0 - 46.0 %   MCV 92.0 78.0 - 100.0 fL   MCH 29.7 26.0 - 34.0 pg   MCHC 32.2 30.0 - 36.0 g/dL   RDW 15.2 11.5 - 15.5 %   Platelets 514 (H) 150 - 400 K/uL   Dg Chest 2 View  09/04/2015  CLINICAL DATA:  Pacemaker inserted yesterday EXAM: CHEST  2 VIEW COMPARISON:  09/01/2015 FINDINGS: Cardiomediastinal silhouette is stable. There is dual lead cardiac pacemaker with left subclavian approach with leads in right atrium and right ventricle. There  is no pneumothorax. No infiltrate or pulmonary edema. Old left rib fractures are again noted. IMPRESSION: No active cardiopulmonary disease. Dual lead cardiac pacemaker in place with leads in right atrium and right ventricle. No pneumothorax. Electronically Signed   By: Lahoma Crocker M.D.   On: 09/04/2015 08:14   Mr [name] Head Wo Contrast  09/02/2015  CLINICAL DATA:  CVA.  Cardiac  arrest.  Abnormal MRI scan. EXAM: MRA HEAD WITHOUT CONTRAST TECHNIQUE: Angiographic images of the Circle of Willis were obtained using MRA technique without intravenous contrast. COMPARISON:  MRI brain 09/01/2015 FINDINGS: The internal carotid arteries are within normal limits from the high cervical segments through the ICA termini bilaterally. The A1 and M1 segments are within normal limits. There is mild irregularity within the proximal a 2 segments. There is an early bifurcation of the proximal right anterior cerebral artery, a normal variant. No definite anterior communicating artery is present. The MCA bifurcations are intact. The visualized ACA and MCA branch vessels are within normal limits. The vertebral arteries are codominant. The PICA origins are not visualized. The PICA origins are visualized and normal. Both posterior cerebral arteries originate from the basilar tip. There is mild attenuation of proximal PCA vessels bilaterally. The distal PCA branch vessels are intact. IMPRESSION: 1. Mild atherosclerotic irregularity without a significant proximal stenosis, aneurysm, or branch vessel occlusion to account for the patient's acute infarcts. Electronically Signed   By: San Morelle M.D.   On: 09/02/2015 18:12    Medical Problem List and Plan: 1.  Weakness, abnormality of gait, and cognitive dysfunction secondary to Cardiac arrest with hypoxic encephalopathy and embolic/watershed stroke. 2.  LLE DVT/Anticoagulation: Pharmaceutical: Other (comment)-Eliquis 3. Pain Management:  Tylenol prn effective for back/chest  soreness.  4. Mood: Team to provide ego support. LCSW to follow for evaluation and support.  5. Neuropsych: This patient is not fully capable of making decisions on her own behalf. 6. Skin/Wound Care: Monitor wound daily. Routine pressure relief measures.  7. Fluids/Electrolytes/Nutrition: Monitor I/O. Check lytes in am.  8. VT/PEA arrest: NO DRIVING FOR 6 MONTHS POST ARREST.  ICD in place.  9. Acute systolic dysfunction: Likely due to hypertensive CM--on Coreg and Ace.  10. HTN: Monitor BP bid. Continue coreg and Lisinopril.  11. Supra and infratentorial infarcts could be embolic due to cardiac cath v/s small vessel disease:  12. Bipolar disease: Mood stable on Effexor XR with xanax prn.  13. Leucocytosis: Likely due to E coli UTI. Started on ceftriaxone yesterday and changed to ceftin  D # 2---sensitivities from 2/25 still pending.  14.  Acute renal insufficiency: push po fluids.    Post Admission Physician Evaluation: 1. Functional deficits secondary  to Cardiac arrest with hypoxic encephalopathy and embolic/watershed stroke. 2. Patient is admitted to receive collaborative, interdisciplinary care between the physiatrist, rehab nursing staff, and therapy team. 3. Patient's level of medical complexity and substantial therapy needs in context of that medical necessity cannot be provided at a lesser intensity of care such as a SNF. 4. Patient has experienced substantial functional loss from his/her baseline which was documented above under the "Functional History" and "Functional Status" headings.  Judging by the patient's diagnosis, physical exam, and functional history, the patient has potential for functional progress which will result in measurable gains while on inpatient rehab.  These gains will be of substantial and practical use upon discharge  in facilitating mobility and self-care at the household level. 5. Physiatrist will provide 24 hour management of medical needs as well as oversight  of the therapy plan/treatment and provide guidance as appropriate regarding the interaction of the two. 6. 24 hour rehab nursing will assist with safety, skin/wound care, disease management, medication administration and patient education and help integrate therapy concepts, techniques,education, etc. 7. PT will assess and treat for/with: Lower extremity strength, range of motion, stamina, balance, functional mobility, safety, adaptive techniques and equipment, coping  skills, pain control, stroke education.  Goals are: Supervision/Mod I. 8. OT will assess and treat for/with: ADL's, functional mobility, safety, upper extremity strength, adaptive techniques and equipment, ego support, and community reintegration.   Goals are: Supervision/Mod I. Therapy may proceed with showering this patient. 9. SLP will assess and treat for/with: higher level cognition, memory dysfunction.  Goals are: Mod I/Supervision. 10. Case Management and Social Worker will assess and treat for psychological issues and discharge planning. 11. Team conference will be held weekly to assess progress toward goals and to determine barriers to discharge. 12. Patient will receive at least 3 hours of therapy per day at least 5 days per week. 13. ELOS: 10-14 days.       14. Prognosis:  excellent   Delice Lesch, MD 09/04/2015

## 2015-09-04 NOTE — Progress Notes (Signed)
Utilization review completed.  

## 2015-09-04 NOTE — Progress Notes (Signed)
Marie Ramos Kitchen STROKE TEAM PROGRESS NOTE   SUBJECTIVE (INTERVAL HISTORY) Her son and husband are at the bedside.  LE venous doppler showed positive DVT at LLE. TCD bubble study negative for PFO. Pt clinically stable and no complains.   OBJECTIVE Temp:  [97.4 F (36.3 C)-98.8 F (37.1 C)] 98.7 F (37.1 C) (02/28 1228) Pulse Rate:  [69-98] 77 (02/28 1228) Cardiac Rhythm:  [-] Normal sinus rhythm (02/28 0705) Resp:  [18] 18 (02/28 1228) BP: (118-142)/(54-67) 133/54 mmHg (02/28 1228) SpO2:  [97 %-99 %] 99 % (02/28 1228) Weight:  [221 lb (100.245 kg)] 221 lb (100.245 kg) (02/28 0500)  CBC:  Recent Labs Lab 08/31/15 0300 09/01/15 0427  09/04/15 0455 09/04/15 1025  WBC 13.3* 12.8*  < > 17.2* 16.5*  NEUTROABS 8.8* 8.5*  --   --   --   HGB 12.8 12.9  < > 12.1 11.9*  HCT 39.6 39.9  < > 38.8 36.9  MCV 91.2 91.1  < > 92.2 92.0  PLT 450* 489*  < > 539* 514*  < > = values in this interval not displayed.  Basic Metabolic Panel:  Recent Labs Lab 08/30/15 0615 08/31/15 0300  09/04/15 0455 09/04/15 1025  NA 139 138  < > 139 137  K 3.0* 3.2*  < > 5.2* 4.1  CL 101 102  < > 103 102  CO2 26 24  < > 26 27  GLUCOSE 118* 86  < > 97 137*  BUN 50* 57*  < > 30* 26*  CREATININE 1.26* 1.21*  < > 0.95 0.99  CALCIUM 9.1 9.2  < > 9.1 8.9  MG 2.1 2.4  --   --   --   PHOS 4.8* 4.1  --   --   --   < > = values in this interval not displayed.  Lipid Panel:     Component Value Date/Time   CHOL 225* 09/03/2015 0248   TRIG 333* 09/03/2015 0248   HDL 43 09/03/2015 0248   CHOLHDL 5.2 09/03/2015 0248   VLDL 67* 09/03/2015 0248   LDLCALC 115* 09/03/2015 0248   HgbA1c:  Lab Results  Component Value Date   HGBA1C 6.2* 09/03/2015   Urine Drug Screen: No results found for: LABOPIA, COCAINSCRNUR, LABBENZ, AMPHETMU, THCU, LABBARB    IMAGING I have personally reviewed the radiological images below and agree with the radiology interpretations.  Dg Chest 2 View 09/01/2015   1. Interval marked improvement in  aeration in the left lower lobe since the examination 4 days ago, with only mild residual atelectasis and/or pneumonia. No acute cardiopulmonary disease otherwise. 2. Interval resolution of pulmonary venous hypertension and pulmonary edema.   Mr Marie Ramos Head Wo Contrast 09/02/2015   1. Mild atherosclerotic irregularity without a significant proximal stenosis, aneurysm, or branch vessel occlusion to account for the patient's acute infarcts.   Mr Brain Wo Contrast 09/01/2015   Scattered supra and infratentorial foci of acute ischemia measuring up to 11 mm, which may represent embolic phenomena, possible component of watershed ischemia. Mild chronic small vessel ischemic disease.   Carotid Doppler   There is 1-39% bilateral ICA stenosis. Vertebral artery flow is antegrade.    2D Echocardiogram  - Left ventricle: The cavity size was normal. Systolic function wasmildly to moderately reduced. The estimated ejection fraction wasin the range of 40% to 45%. There is severe asymmetric septalhypetrophy without dynamic obstruction. Findings suggestive ofhypertrophic nonobstructive cardiomyopathy. Diffuse hypokinesis.Doppler parameters are consistent with abnormal left ventricularrelaxation (grade 1 diastolic dysfunction). Septal  thickness, ED(2D): 17 mm. Posterior wall thickness, ED (2D): 10.5 mm. - Technically difficult study. Impressions:  Technically difficult study, however appears to have asymmetricseptal hypertrophic cardiomyoapathy without obstruction. Insetting of VF arrest, would consider cardiac MRI to betterevaluate as this could be the potential etiology of herarrhythmia.  Cardiac MRI 08/31/2015 findings are suggestive of dilated hypertensive cardiomyopathy rather than hypertrophic cardiomyopathy.  LE venous doppler - DVT noted in the left posterior tibial and left peroneal veins. No DVT RLE.  TCD bubble study - negative for PFO   PHYSICAL EXAM  Temp:  [97.4 F (36.3 C)-98.8 F (37.1  C)] 98.7 F (37.1 C) (02/28 1228) Pulse Rate:  [69-98] 77 (02/28 1228) Resp:  [18] 18 (02/28 1228) BP: (118-142)/(54-67) 133/54 mmHg (02/28 1228) SpO2:  [97 %-99 %] 99 % (02/28 1228) Weight:  [221 lb (100.245 kg)] 221 lb (100.245 kg) (02/28 0500)  General - Well nourished, well developed, in no apparent distress.  Ophthalmologic - Fundi not visualized due to noncooperation.  Cardiovascular - Regular rate and rhythm.  Mental Status -  Level of arousal and orientation to time, place, and person were intact. Language including expression, naming, repetition, comprehension was assessed and found intact. Fund of Knowledge was assessed and was intact.  Cranial Nerves II - XII - II - Visual field intact OU. III, IV, VI - Extraocular movements intact. V - Facial sensation intact bilaterally. VII - Facial movement intact bilaterally. VIII - Hearing & vestibular intact bilaterally. X - Palate elevates symmetrically. XI - Chin turning & shoulder shrug intact bilaterally. XII - Tongue protrusion intact.  Motor Strength - The patient's strength was 4+/5 in all extremities and pronator drift was absent.  Bulk was normal and fasciculations were absent.   Motor Tone - Muscle tone was assessed at the neck and appendages and was normal.  Reflexes - The patient's reflexes were 1+ in all extremities and she had no pathological reflexes.  Sensory - Light touch, temperature/pinprick were assessed and were symmetrical.    Coordination - The patient had normal movements in the hand with no ataxia or dysmetria.  Tremor was absent.  Gait and Station - deferred to PT due to safety concerns    ASSESSMENT/PLAN [name] is a 60 y.o. female with history of bipolar disease, admitted post VFib arrest with resultant cardioembolic infarcts seen on MRI.  She was not considered for IV t-PA due to non-stroke presentation.   Stroke:  Scattered supra and infratentorial foci of acute ischemia,   infarcts embolic could be result of recent cardiac cath   MRI  Scattered supra and infratentorial foci of acute ischemia. small vessel disease   MRA  Mild atherosclerosis  Carotid Doppler  No significant stenosis   2D Echo  EF 40-45%, no source of embolus  Cardiac MRI no source of embolus  LE venous doppler LLE DVT  TCD bubble study negative for PFO  TEE not necessary from the neurology standpoint.  LDL 115  HgbA1c 6.2  IV heparin for VTE prophylaxis Diet Heart Room service appropriate?: Yes; Fluid consistency:: Thin  No antithrombotic prior to admission, now on No antithrombotic. Due to DVT, pt was put on eliquis 10mg  bid x7 days and then 5mg  bid.  Ongoing aggressive stroke risk factor management  Therapy recommendations:  CIR  Disposition:  Return home  LLE DVT  Confirmed by venous doppler  Likely due to her inactivity s/p cardiac arrest  On eliquis 10mg  bid x7 days and then 5mg  bid.  Hypertension  Stable  Hyperlipidemia  Home meds:  No statin  LDL 115, goal < 70  Added lipitor 20 mg dialy  Continue statin at discharge  Other Stroke Risk Factors  Obesity, Body mass index is 34.61 kg/(m^2).   Other Active Problems  Cardiac arrest likely due to increased psych medications that prolonged her QT, intubated 2/17 to 2/21, ICD placement 2/27  Hospital day # 11  Neurology will sign off. Please call with questions. Pt will follow up with Dr. Erlinda Hong at [facility] in about 2 months. Thanks for the consult.  Rosalin Hawking, MD PhD Stroke Neurology 09/04/2015 3:52 PM       To contact Stroke Continuity provider, please refer to [email or web address] After hours, contact General Neurology

## 2015-09-04 NOTE — Clinical Social Work Note (Signed)
Clinical Social Work Assessment  Patient Details  Name: Marie Ramos MRN: [id number] Date of Birth: 1956/01/30  Date of referral:  09/04/15               Reason for consult:  Facility Placement                Permission sought to share information with:  Family Supports, Chartered certified accountant granted to share information::  Yes, Verbal Permission Granted  Name::        Agency::     Relationship::     Contact Information:     Housing/Transportation Living arrangements for the past 2 months:  Single Family Home Source of Information:  Spouse Patient Interpreter Needed:  None Criminal Activity/Legal Involvement Pertinent to Current Situation/Hospitalization:  No - Comment as needed Significant Relationships:  Adult Children, Spouse (husband [name] [phone]) Lives with:  Spouse Do you feel safe going back to the place where you live?  Yes Need for family participation in patient care:  Yes (Comment)  Care giving concerns:  PT recommend SNF for pt decreased mobility   Social Worker assessment / plan:  BSW entered patient room. Patient is alert and oriented. Patient has her husband and son in the room. BSW intern offer plan B in case CIR does not see the patient qualified enough for inpatient rehab.  BSW intern explained SNF referral and process to patient husband and son. They are agreeable to SNF. The husband stated that he will provide transportation for the patient when she is discharge. The biggest concern for the husband is having to pay the co-pay . He stated that money is already low so he couldn't afford too much more. Patient husband states that since he is retired he can provide that 24 hour care to the patient if needed if she was returning home. Patient also has a daughter in the [place] area that can come by and check on her if needed. The son is just here for visiting purposes, he states he is from [place]. Patient husband did state  that the patient is on oxygen in the hospital but does not use oxygen at home.   Employment status:  Disabled (Comment on whether or not currently receiving Disability) Insurance information:  Medicare (blue cross blue sheild) PT Recommendations:  [place] / Referral to community resources:  Acute Rehab  Patient/Family's Response to care:  Patient family is agreeable to SNF.  Patient/Family's Understanding of and Emotional Response to Diagnosis, Current Treatment, and Prognosis:  Patient family has good insight on medical condition as well as current treatment plans.   Emotional Assessment Appearance:  Appears stated age Attitude/Demeanor/Rapport:   (welcoming friendly) Affect (typically observed):  Accepting, Pleasant Orientation:  Oriented to Self, Oriented to Place, Oriented to  Time, Oriented to Situation Alcohol / Substance use:  Never Used Psych involvement (Current and /or in the community):  No (Comment)  Discharge Needs  Concerns to be addressed:  No discharge needs identified Readmission within the last 30 days:  No Current discharge risk:  None Barriers to Discharge:  No Barriers Identified   Charlean McNeill, Student-SW 09/04/2015, 3:07 PM  I have reviewed and agree with the findings of this assessment.  Domenica Reamer, [place] Social Worker [phone]

## 2015-09-04 NOTE — NC FL2 (Signed)
[place] LEVEL OF CARE SCREENING TOOL     IDENTIFICATION  Patient Name: Marie Ramos Birthdate: [birth year] Sex: female Admission Date (Current Location): 08/24/2015  [facility] and [place] Number:  Herbalist and Address:  [facility], [place] [address] [place], [place] [address]      Provider Number: [id number]  Attending Physician Name and Address:  Florencia Reasons, MD  Relative Name and Phone Number:  Lenna Sciara, daughter, [phone]    Current Level of Care: Hospital Recommended Level of Care: [place] Prior Approval Number:    Date Approved/Denied:   PASRR Number:    Discharge Plan: SNF    Current Diagnoses: Patient Active Problem List   Diagnosis Date Noted  . History of traumatic brain injury   . Bipolar affective disorder in remission ([place])   . Leukocytosis   . Thrombocytosis ([place])   . Cerebral infarction due to embolism of cerebral artery ([place])   . Anoxic brain damage (H[facility])   . Cough   . Essential hypertension   . Systolic dysfunction with acute on chronic heart failure ([place])   . Hypertrophic obstructive cardiomyopathy ([place]) 08/29/2015  . QT prolongation 08/29/2015  . Hypokalemia 08/29/2015  . Acute respiratory failure ([place])   . Cardiac arrest ([place]) 08/24/2015    Orientation RESPIRATION BLADDER Height & Weight     Self, Time, Situation, Place  Normal Continent Weight: 221 lb (100.245 kg) Height:  5\' 7"  (170.2 cm)  BEHAVIORAL SYMPTOMS/MOOD NEUROLOGICAL BOWEL NUTRITION STATUS      Continent  (Please see DC summary)  AMBULATORY STATUS COMMUNICATION OF NEEDS Skin   Limited Assist Verbally Normal                       Personal Care Assistance Level of Assistance  Bathing, Feeding, Dressing Bathing Assistance: Maximum assistance Feeding assistance: Independent Dressing Assistance: Limited assistance     Functional Limitations Info  Sight Sight Info: Impaired        SPECIAL CARE  FACTORS FREQUENCY  PT (By licensed PT)     PT Frequency: min 3x/week              Contractures      Additional Factors Info  Code Status, Allergies, Insulin Sliding Scale Code Status Info: Full Allergies Info: Naproxen, Vicodin   Insulin Sliding Scale Info: insulin aspart (novoLOG) injection 2-6 Units       Current Medications (09/04/2015):  This is the current hospital active medication list Current Facility-Administered Medications  Medication Dose Route Frequency Provider Last Rate Last Dose  . 0.9 %  sodium chloride infusion  250 mL Intravenous PRN Thompson Grayer, MD      . acetaminophen (TYLENOL) tablet 325-650 mg  325-650 mg Oral Q4H PRN Thompson Grayer, MD   650 mg at 09/04/15 1111  . ALPRAZolam Duanne Moron) tablet 0.25 mg  0.25 mg Oral QHS PRN Brand Males, MD   0.25 mg at 08/30/15 2319  . apixaban (ELIQUIS) tablet 10 mg  10 mg Oral BID Otilio Miu, RPH   10 mg at 09/04/15 1111  . [START ON 09/11/2015] apixaban (ELIQUIS) tablet 5 mg  5 mg Oral BID Otilio Miu, [facility]      . atorvastatin (LIPITOR) tablet 20 mg  20 mg Oral q1800 Donzetta Starch, NP   20 mg at 09/03/15 1750  . benzonatate (TESSALON) capsule 100 mg  100 mg Oral TID PRN Florencia Reasons, MD  100 mg at 09/03/15 2226  . carvedilol (COREG) tablet 6.25 mg  6.25 mg Oral BID WC Thompson Grayer, MD   6.25 mg at 09/04/15 0850  . cefTRIAXone (ROCEPHIN) 1 g in dextrose 5 % 50 mL IVPB  1 g Intravenous Q24H Florencia Reasons, MD      . dextromethorphan-guaiFENesin [facility] DM) 30-600 MG per 12 hr tablet 1 tablet  1 tablet Oral BID Florencia Reasons, MD   1 tablet at 09/04/15 1054  . insulin aspart (novoLOG) injection 2-6 Units  2-6 Units Subcutaneous 6 times per day Holley Raring, NP   2 Units at 09/04/15 1325  . lisinopril (PRINIVIL,ZESTRIL) tablet 20 mg  20 mg Oral Daily Will Meredith Leeds, MD   20 mg at 09/04/15 1054  . ondansetron (ZOFRAN) injection 4 mg  4 mg Intravenous Q6H PRN Thompson Grayer, MD      . Oxcarbazepine (TRILEPTAL) tablet 300 mg   300 mg Oral BID Ambrose Finland, MD   300 mg at 09/04/15 1057  . pantoprazole (PROTONIX) EC tablet 40 mg  40 mg Oral Daily Brand Males, MD   40 mg at 09/04/15 1054  . polyvinyl alcohol (LIQUIFILM TEARS) 1.4 % ophthalmic solution 2 drop  2 drop Both Eyes PRN Florencia Reasons, MD   2 drop at 09/04/15 1447  . sodium chloride flush (NS) 0.9 % injection 3 mL  3 mL Intravenous Q12H Thompson Grayer, MD   3 mL at 09/04/15 1000  . sodium chloride flush (NS) 0.9 % injection 3 mL  3 mL Intravenous PRN Thompson Grayer, MD      . venlafaxine XR (EFFEXOR-XR) 24 hr capsule 75 mg  75 mg Oral Daily Brand Males, MD   75 mg at 09/04/15 1055     Discharge Medications: Please see discharge summary for a list of discharge medications.  Relevant Imaging Results:  Relevant Lab Results:   Additional Information SSN: [place], [place]

## 2015-09-04 NOTE — Progress Notes (Signed)
ANTICOAGULATION CONSULT NOTE - Initial Consult  Pharmacy Consult for Apixaban Indication: DVT  Allergies  Allergen Reactions  . Naproxen Itching  . Vicodin [Hydrocodone-Acetaminophen] Itching    Patient Measurements: Height: 5\' 7"  (170.2 cm) Weight: 221 lb (100.245 kg) IBW/kg (Calculated) : 61.6  Vital Signs: Temp: 97.8 F (36.6 C) (02/28 0541) BP: 142/67 mmHg (02/28 0541) Pulse Rate: 98 (02/28 0541)  Labs:  Recent Labs  09/02/15 0349 09/03/15 0248 09/04/15 0455  HGB 11.8* 11.9* 12.1  HCT 37.9 38.7 38.8  PLT 516* 553* 539*  LABPROT  --  14.3  --   INR  --  1.09  --   CREATININE 1.10* 0.89 0.95    Estimated Creatinine Clearance: 76.5 mL/min (by C-G formula based on Cr of 0.95).   Medical History: Past Medical History  Diagnosis Date  . Bipolar 1 disorder ([place])   . Hypertrophic obstructive cardiomyopathy ([place]) 08/29/2015  . Cardiac arrest ([place]) 08/24/2015  . QT prolongation 08/29/2015  . TBI (traumatic brain injury) [facility]) 2011    Inpatient rehab [facility]  . Macular degeneration   . Chronically dry eyes   . Ventricular fibrillation ([place]) 09/03/15    MDT ICD Dr. Rayann Heman   Assessment: 81yof found to have a DVT in her left posterior tibial and left peroneal veins. She will begin apixaban.  Goal of Therapy:  Monitor platelets by anticoagulation protocol: Yes   Plan:  1) Apixaban 10mg  bid x 7 days then 5mg  bid 2) Case manager aware 3) Will provide education  Deboraha Sprang 09/04/2015,10:49 AM

## 2015-09-04 NOTE — Progress Notes (Signed)
Physical Therapy Treatment Patient Details Name: Marie Ramos MRN: [id number] DOB: 04-07-1956 Today's Date: 09/04/2015    History of Present Illness 60 year old with history of bipolar disorder. Admitted after a witnessed cardiac arrest, intubated initially, emergently taken for heart cath and placed on hypothermic protocol. Encepholapthy noted. MRI brain revealed several subcentimeter foci of acute infarction. + DVT LLE.    PT Comments    Patient progressing well towards PT goals. Pt now with Left hemiparesis s/p CVA. Balance deficits and staggering to the left noted during gait training today requiring Min A for balance. Left knee instability noted as well but no overt knee buckling. Highly motivated to return to functional independence. Appropriate for CIR to maximize mobility and safety prior to return home.   Follow Up Recommendations  CIR     Equipment Recommendations  Other (comment) (defer to next venue)    Recommendations for Other Services       Precautions / Restrictions Precautions Precautions: Fall Restrictions Weight Bearing Restrictions: No    Mobility  Bed Mobility               General bed mobility comments: pt in chair  Transfers Overall transfer level: Needs assistance Equipment used: None Transfers: Sit to/from Stand Sit to Stand: Min guard         General transfer comment: Min guard for safety with pt reaching for support in standing. Stood from [employer], from toilet x1.   Ambulation/Gait Ambulation/Gait assistance: Min assist Ambulation Distance (Feet): 75 Feet Assistive device: None Gait Pattern/deviations: Decreased stride length;Staggering left;Staggering right;Drifts right/left Gait velocity: decreased Gait velocity interpretation: <1.8 ft/sec, indicative of risk for recurrent falls General Gait Details: Unsteady gait with pt reaching for rail and counter for support. Staggering to the left with differentiating step lengths.  Left knee instability at times but no overt buckling.   Stairs            Wheelchair Mobility    Modified Rankin (Stroke Patients Only) Modified Rankin (Stroke Patients Only) Pre-Morbid Rankin Score: No symptoms Modified Rankin: Moderately severe disability     Balance Overall balance assessment: Needs assistance Sitting-balance support: Feet supported;No upper extremity supported Sitting balance-Leahy Scale: Good     Standing balance support: During functional activity;Single extremity supported Standing balance-Leahy Scale: Fair Standing balance comment: Reliant on at least 1 UE for support. Unsteady without assist.                    Cognition Arousal/Alertness: Awake/alert Behavior During Therapy: WFL for tasks assessed/performed   Area of Impairment: Safety/judgement         Safety/Judgement: Decreased awareness of safety;Decreased awareness of deficits          Exercises      General Comments General comments (skin integrity, edema, etc.): Incoordination LUE/LE as noted by finger to nose testing; dysmetria noted. Weakness noted LLE- grossly ~3/5 left quads, hip flexion.      Pertinent Vitals/Pain Pain Assessment: Faces Faces Pain Scale: Hurts little more Pain Location: chest at compression site Pain Descriptors / Indicators: Sore Pain Intervention(s): Monitored during session;Repositioned    Home Living                      Prior Function            PT Goals (current goals can now be found in the care plan section) Progress towards PT goals: Progressing toward goals    Frequency  Min 3X/week  PT Plan Current plan remains appropriate    Co-evaluation             End of Session Equipment Utilized During Treatment: Gait belt Activity Tolerance: Patient tolerated treatment well Patient left: in chair;with call bell/phone within reach;with SCD's reapplied     Time: [id number] PT Time Calculation (min) (ACUTE  ONLY): 20 min  Charges:  $Gait Training: 8-22 mins                    G Codes:      Ginger Leeth A Tajee Savant 09/04/2015, 3:03 PM  Wray Kearns, [place], DPT [phone]

## 2015-09-04 NOTE — Progress Notes (Addendum)
I will initiate insurance authorization process for possible CIR.  I have spoken with PT who plans to see pt. at some point today for current functional status.  I have discussed with pt. (family currently out of room) as well as Marvetta Gibbons, RNCM and with Cedric Fishman, CSW (who will forward info to [employer], CSW). Please call if questions.  [place] Admissions Coordinator Cell [phone] Office [phone]   1214 Addendum:  I met with pt's husband and her 2 children at the bedside.  Twenty four hour family supports available.  Family and pt. Is aware that a back up plan is necessary in the event that I receive an insurance denial.    Gerlean Ren

## 2015-09-04 NOTE — Progress Notes (Signed)
*  PRELIMINARY RESULTS* Vascular Ultrasound Lower extremity venous duplex has been completed.  Preliminary findings: DVT noted in the left posterior tibial and left peroneal veins. No DVT RLE.   Landry Mellow, RDMS, RVT  09/04/2015, 8:26 AM

## 2015-09-04 NOTE — Progress Notes (Signed)
PROGRESS NOTE  [name] [id number] DOB: 1956/01/11 DOA: 08/24/2015 PCP: No primary care provider on file.  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 Marie Ramos is a 60 year old with history of bipolar disorder. Admitted after a witnessed cardiac arrest. The initial rhythm was likely A. fib, V. tach as the AED delivered shocks. She then went to PEA upon EMS arrival and pulse was recovered after Epi injection. later patient's rhythm converted into SVT. She was shocked and was converted to NSR at 360 joulesTime to ROSC is estimated at around 10 minutes. Patient arrived to the ER on 2/17 unresponsive. She was found to become progressively dyspnea, initial abg showed P/F ration of 193, co2 retention and ph 7.2, patient was intubated in the ED , stat icu and cardiology consult and admitted to icu. Hypotermia protocol initiated in the ED  Patient had been reportedly increasing her psychiatric medications to deal with her bipolar disease. She has prolonged QTC at 530. 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 Cardiac arrest thought due to prolonged QTc from psych meds. Psych meds adjusted ,  s/p ICD on 09/03/2015 for secondary prevention of VF arrest. EP input appreciated.   Acute respiratory failure due to cardiac arrest required intubation from 2/17 to 2/21. Now on room air.  acute multi foci infarct found on mri brian initially ordered to evaluate anoxic brain injury due to mild cognitive impairment.   2/26Neurology recommended to order mra head without contrast, carotid doppler, TEE i have informed cardiology about neurologist request for TEE. 2/27 neurology recommended no need of TEE. A1c6.2, ldl 115, recommend asa 325 and statin.  acute deep vein thrombosis involving the  left posterial tibial vein and left peroneal vein:  TCD bubble study negative for PFO on 2/28 :negative Transcranial Doppler Bubble Study indicative of no right to left shunt started eliquis on 2/28, need to discuss with neuro about asa, since now on eliquis.  Bipolar: psych input appreciated, meds changed to trileptal and continue effexor, family want update from psych in the next few days. i talked to psychiatry Dr Lenna Sciara on 2/26 who recommended continue current meds and outpatient psych follow up.  Hypokalemia, replace k  Acute kidney injury: cr normal at baseline, today 1.21, adequate urine output, renal dosing meds,  ua showed bacteriuria, urine culture g-rods, started on rocephin on 2/28  HTN: on coreg/lisinopril  Dilated hypertensive cardiomyopathy/systolic and diastolic chf:   Patient received lasix while on vent. echo lvef [id number], grade i diastolic dysfunction. currently euvolemic. Close monitor volume status.  Cough: lung exam clear, no edema, no fever, no leukocytosis, cxr 2 view showed improvement, start incentive spirometer, mucinex. Patient is newly started on lisinopril, side effect from lisinopril? Cough better  Obsesity; life style  modification  Deconditioning: CIR   Code Status: full  Family Communication: patient and her husband and son in room  Disposition Plan: CIR   Consultants:  Cardiology/EP  Critical care admit, transfer to [facility] on 2/24  Psychiatry  CIR(inpatient rehab)  neurology  Procedures:  Intubation 2/17/extubation 2/21 Stat echo , stat cardiac cath showed Widely patent coronary arteries  Normal LVEDP  EEG on 2/17: This is an abnormal routine inpatient EEG, suggestive of encephalopathy, likely medication effect as patient is on sedation, although cannot exclude hypoxic ischemic encephalopathy based on the clinical history of post cardiac arrest. No evidence of electrographic seizures were seen. If clinically indicated, consider repeating the EEG when off of sedation. Clinical correlation is recommended .   Right IJ placed on 2/17  Hypothermia protocol from 2/17 to 2/19  Tube feed while on vent  ICD placement on 2/27 2/28  TRANSCRANIAL DOPPLER WITH BUBBLES  Antibiotics:  Ancef for ICD placement  Rocephin started on 2/28   Objective: BP 125/58 mmHg  Pulse 66  Temp(Src) 98.4 F (36.9 C) (Oral)  Resp 18  Ht 5\' 7"  (1.702 m)  Wt 100.245 kg (221 lb)  BMI 34.61 kg/m2  SpO2 96%  Intake/Output Summary (Last 24 hours) at 09/04/15 2141 Last data filed at 09/04/15 1756  Gross per 24 hour  Intake    480 ml  Output    400 ml  Net     80 ml   Filed Weights   09/02/15 0419 09/03/15 0405 09/04/15 0500  Weight: 100.336 kg (221 lb 3.2 oz) 100.381 kg (221 lb 4.8 oz) 100.245 kg (221 lb)    Exam:   General:  NAD,   Cardiovascular: RRR  Respiratory: CTABL  Abdomen: Soft/ND/NT, positive BS  Musculoskeletal: No Edema  Neuro: mild cognitive impairment, no focal deficit, aaox3, short term memory continue to improve  Data Reviewed: Basic Metabolic Panel:  Recent Labs Lab 08/29/15 0515 08/30/15 0615 08/31/15 0300 09/01/15 0427 09/02/15 0349 09/03/15 0248  09/04/15 0455 09/04/15 1025  NA 145 139 138 139 143 138 139 137  K 3.2* 3.0* 3.2* 4.3 4.5 4.5 5.2* 4.1  CL 100* 101 102 108 105 103 103 102  CO2 27 26 24 25 26 27 26 27   GLUCOSE 134* 118* 86 96 101* 99 97 137*  BUN 39* 50* 57* 37* 32* 26* 30* 26*  CREATININE 1.21* 1.26* 1.21* 1.04* 1.10* 0.89 0.95 0.99  CALCIUM 9.2 9.1 9.2 8.9 9.1 9.1 9.1 8.9  MG 2.1 2.1 2.4  --   --   --   --   --   PHOS 4.7* 4.8* 4.1  --   --   --   --   --    Liver Function Tests:  Recent Labs Lab 09/02/15 0349  AST 26  ALT 42  ALKPHOS 94  BILITOT 0.3  PROT 5.7*  ALBUMIN 2.5*   No results for input(s): LIPASE, AMYLASE in the last 168 hours. No results for input(s): AMMONIA in the last 168 hours. CBC:  Recent Labs Lab 08/29/15 0515 08/30/15 0615 08/31/15 0300 09/01/15 0427 09/02/15 0349 09/03/15 0248 09/04/15 0455 09/04/15 1025  WBC 16.7* 13.7* 13.3* 12.8* 13.6* 14.1* 17.2* 16.5*  NEUTROABS 15.0* 11.6* 8.8* 8.5*  --   --   --   --  HGB 12.3 12.8 12.8 12.9 11.8* 11.9* 12.1 11.9*  HCT 38.4 39.5 39.6 39.9 37.9 38.7 38.8 36.9  MCV 90.6 89.2 91.2 91.1 92.0 92.8 92.2 92.0  PLT 473* 516* 450* 489* 516* 553* 539* 514*   Cardiac Enzymes:   No results for input(s): CKTOTAL, CKMB, CKMBINDEX, TROPONINI in the last 168 hours. BNP (last 3 results) No results for input(s): BNP in the last 8760 hours.  ProBNP (last 3 results) No results for input(s): PROBNP in the last 8760 hours.  CBG:  Recent Labs Lab 09/04/15 0504 09/04/15 0547 09/04/15 1319 09/04/15 1636 09/04/15 2116  GLUCAP 113* 148* 126* 114* 180*    Recent Results (from the past 240 hour(s))  Culture, Urine     Status: None (Preliminary result)   Collection Time: 09/01/15  7:55 PM  Result Value Ref Range Status   Specimen Description URINE, RANDOM  Final   Special Requests NONE  Final   Culture   Final    >=100,000 COLONIES/mL GRAM NEGATIVE RODS CULTURE REINCUBATED FOR BETTER GROWTH    Report Status PENDING  Incomplete      Studies: Dg Chest 2 View  09/04/2015  CLINICAL DATA:  Pacemaker inserted yesterday EXAM: CHEST  2 VIEW COMPARISON:  09/01/2015 FINDINGS: Cardiomediastinal silhouette is stable. There is dual lead cardiac pacemaker with left subclavian approach with leads in right atrium and right ventricle. There is no pneumothorax. No infiltrate or pulmonary edema. Old left rib fractures are again noted. IMPRESSION: No active cardiopulmonary disease. Dual lead cardiac pacemaker in place with leads in right atrium and right ventricle. No pneumothorax. Electronically Signed   By: Lahoma Crocker M.D.   On: 09/04/2015 08:14    Scheduled Meds: . apixaban  10 mg Oral BID  . [START ON 09/11/2015] apixaban  5 mg Oral BID  . atorvastatin  20 mg Oral q1800  . carvedilol  6.25 mg Oral BID WC  . cefTRIAXone (ROCEPHIN)  IV  1 g Intravenous Q24H  . dextromethorphan-guaiFENesin  1 tablet Oral BID  . famotidine  20 mg Oral Daily  . insulin aspart  2-6 Units Subcutaneous 6 times per day  . lisinopril  20 mg Oral Daily  . OXcarbazepine  300 mg Oral BID  . sodium chloride flush  3 mL Intravenous Q12H  . venlafaxine XR  75 mg Oral Daily    Continuous Infusions:    Time spent: 35mins  Jannatul Wojdyla MD, PhD  [facility] Pager [phone]. If 7PM-7AM, please contact night-coverage at [email or web address], password [facility] 09/04/2015, 9:41 PM  LOS: 11 days

## 2015-09-04 NOTE — Procedures (Signed)
[facility]  [address] [place]. [place] [address].  [phone]   TRANSCRANIAL [name]  Date of Birth: 1956-03-07 Medical Record Number: [id number] Indications: embolic stroke with positive DVT Date of Procedure: 09/04/2015 Clinical History: embolic stroke with positive DVT Technical Description: Transcranial Doppler Bubble Study was performed at the bedside after taking written informed consent from the patient and explaining risk/benefits. The right middle cerebral artery was insonated using a hand held probe. And IV line had been previously inserted in the left forearm by the RN using aseptic precautions. Agitated saline injection at rest and after suboptimal valsalva maneuver did not result in high intensity transient signals (HITS).  Impression: negative Transcranial Doppler Bubble Study indicative of no right to left shunt   Results were explained to the patient. Questions were answered.  Rosalin Hawking, MD PhD Stroke Neurology 09/04/2015 3:51 PM

## 2015-09-04 NOTE — Discharge Instructions (Addendum)
Supplemental Discharge Instructions for  Pacemaker/Defibrillator Patients  Activity No heavy lifting or vigorous activity with your left/right arm for 6 to 8 weeks.  Do not raise your left/right arm above your head for one week.  Gradually raise your affected arm as drawn below.              09/08/15                      09/09/15                      09/10/15                     09/11/15 __  NO DRIVING for  6 months     WOUND CARE - Keep the wound area clean and dry.  Do not get this area wet until seen at your wound check. No showers until cleared to at your wound check. - The tape/steri-strips on your wound will fall off; do not pull them off.  No bandage is needed on the site.  DO  NOT apply any creams, oils, or ointments to the wound area. - If you notice any drainage or discharge from the wound, any swelling or bruising at the site, or you develop a fever > 101? F after you are discharged home, call the office at once.  Special Instructions - You are still able to use cellular telephones; use the ear opposite the side where you have your pacemaker/defibrillator.  Avoid carrying your cellular phone near your device. - When traveling through airports, show security personnel your identification card to avoid being screened in the metal detectors.  Ask the security personnel to use the hand wand. - Avoid arc welding equipment, MRI testing (magnetic resonance imaging), TENS units (transcutaneous nerve stimulators).  Call the office for questions about other devices. - Avoid electrical appliances that are in poor condition or are not properly grounded. - Microwave ovens are safe to be near or to operate.  Additional information for defibrillator patients should your device go off: - If your device goes off ONCE and you feel fine afterward, notify the device clinic nurses. - If your device goes off ONCE and you do not feel well afterward, call 911. - If your device goes off TWICE, call  911. - If your device goes off THREE times in one day, call 911.  DO NOT DRIVE YOURSELF OR A FAMILY MEMBER WITH A DEFIBRILLATOR TO THE HOSPITAL--CALL 911.  Information on my medicine - ELIQUIS (apixaban)  This medication education was reviewed with me or my healthcare representative as part of my discharge preparation.    Why was Eliquis prescribed for you? Eliquis was prescribed to treat blood clots that may have been found in the veins of your legs (deep vein thrombosis) or in your lungs (pulmonary embolism) and to reduce the risk of them occurring again.  What do You need to know about Eliquis ? The starting dose is 10 mg (two 5 mg tablets) taken TWICE daily for the FIRST SEVEN (7) DAYS, then on 09/11/15 the dose is reduced to ONE 5 mg tablet taken TWICE daily.  Eliquis may be taken with or without food.   Try to take the dose about the same time in the morning and in the evening. If you have difficulty swallowing the tablet whole please discuss with your pharmacist how to take the medication safely.  Take Eliquis  exactly as prescribed and DO NOT stop taking Eliquis without talking to the doctor who prescribed the medication.  Stopping may increase your risk of developing a new blood clot.  Refill your prescription before you run out.  After discharge, you should have regular check-up appointments with your healthcare provider that is prescribing your Eliquis.    What do you do if you miss a dose? If a dose of ELIQUIS is not taken at the scheduled time, take it as soon as possible on the same day and twice-daily administration should be resumed. The dose should not be doubled to make up for a missed dose.  Important Safety Information A possible side effect of Eliquis is bleeding. You should call your healthcare provider right away if you experience any of the following: ? Bleeding from an injury or your nose that does not stop. ? Unusual colored urine (red or dark brown) or  unusual colored stools (red or black). ? Unusual bruising for unknown reasons. ? A serious fall or if you hit your head (even if there is no bleeding).  Some medicines may interact with Eliquis and might increase your risk of bleeding or clotting while on Eliquis. To help avoid this, consult your healthcare provider or pharmacist prior to using any new prescription or non-prescription medications, including herbals, vitamins, non-steroidal anti-inflammatory drugs (NSAIDs) and supplements.  This website has more information on Eliquis (apixaban): http://www.eliquis.com/eliquis/home

## 2015-09-04 NOTE — Progress Notes (Signed)
SUBJECTIVE: The patient is doing well today.  At this time, she denies chest pain, shortness of breath, or any new concerns, she denies any significant pain or discomfort at her implant site.  CURRENT MEDICATIONS: . antiseptic oral rinse  7 mL Mouth Rinse q12n4p  . atorvastatin  20 mg Oral q1800  . carvedilol  6.25 mg Oral BID WC  . chlorhexidine gluconate  15 mL Mouth Rinse BID  . dextromethorphan-guaiFENesin  1 tablet Oral BID  . insulin aspart  2-6 Units Subcutaneous 6 times per day  . lisinopril  20 mg Oral Daily  . OXcarbazepine  300 mg Oral BID  . pantoprazole  40 mg Oral Daily  . sodium chloride flush  10-40 mL Intracatheter Q12H  . sodium chloride flush  3 mL Intravenous Q12H  . venlafaxine XR  75 mg Oral Daily      OBJECTIVE: Physical Exam: Filed Vitals:   09/03/15 1540 09/03/15 2055 09/04/15 0500 09/04/15 0541  BP: 156/71 118/54  142/67  Pulse: 64 72  98  Temp: 97.6 F (36.4 C) 97.4 F (36.3 C)  97.8 F (36.6 C)  TempSrc: Oral Oral    Resp: 18 18  18   Height:      Weight:   221 lb (100.245 kg)   SpO2: 98% 99%  99%    Intake/Output Summary (Last 24 hours) at 09/04/15 0935 Last data filed at 09/04/15 0400  Gross per 24 hour  Intake    360 ml  Output    900 ml  Net   -540 ml    Telemetry reveals sinus rhythm, occasional PVC's  GEN- The patient is in NAD, sleeping comfortably and easily woken, alert and oriented Head- normocephalic, atraumatic Eyes-  Sclera clear, conjunctiva pink Ears- hearing intact Oropharynx- clear Neck- supple Lungs- Clear to ausculation bilaterally, normal work of breathing Heart- Regular rate and rhythm,  GI- soft, NT, ND Extremities- no clubbing, cyanosis, or edema Skin- no rash or lesion Psych- depressed mood, flat affect Neuro- strength and sensation are intact  ICD dressing is clean and dry, non-tender site, no edema/hematoma  LABS: Basic Metabolic Panel:  Recent Labs  09/03/15 0248 09/04/15 0455  NA 138 139    K 4.5 5.2*  CL 103 103  CO2 27 26  GLUCOSE 99 97  BUN 26* 30*  CREATININE 0.89 0.95  CALCIUM 9.1 9.1   CBC:  Recent Labs  09/03/15 0248 09/04/15 0455  WBC 14.1* 17.2*  HGB 11.9* 12.1  HCT 38.7 38.8  MCV 92.8 92.2  PLT 553* 539*    RADIOLOGY:    ASSESSMENT AND PLAN:  Active Problems:   Cardiac arrest (HCC)   Acute respiratory failure (HCC)   Hypertrophic obstructive cardiomyopathy (HCC)   QT prolongation   Hypokalemia   Essential hypertension   Systolic dysfunction with acute on chronic heart failure (HCC)   Anoxic brain damage (HCC)   Cough   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 VF/PEA arrest The patient had an OOH resuscitated cardiac arrest presumed to be VF with AED advising shocks. She received immediate bystander CPR. Cath this admisssion with no CAD. Echo suggestive of possible nonobstructive hypertrophic CM and EF 40% (possibly due to myocardial stunning). MRI reveals hypertensive rather than hypertrophic CM with EF 45% Keep K >3.9, Mg >1.8 Avoid QT prolonging medications S/p ICD  implant yesterday with Dr. Rayann Heman CXR this morning is without pneumothorax Device was interrogated this morning and functioning normally Wound care instructions and LUE restrictions and ROM were reviewed with the patient F/u appointments have been arranged NO DRIVING 6 months post arrest, patient is made aware  2. Acute systolic dysfunction Cardiac MRI suggests hypertensive CM Continue Coreg, ACE-I titration over the weekend if HR/BP allow Appears compensated by exam/CXR  3. Psych Appreciate input Need to avoid QT prolonging drugs   4. Hypertension appears to be improving   5.  DVT LE Deferred to IM service, our preference would be from a post-implant perspective to use Xarelto 15mg  BID  6.  Leukocytosis Deferred to IM service bacteruiria noted by IM note yesterday  7. CVA Neurology w/u noted No need for TEE   Baldwin [place], MD 09/04/2015 9:35 AM  I have seen, examined the patient, and reviewed the above assessment and plan.  Device interrogation is reviewed and normal.  Changes to above are made where necessary.    Co Sign: Thompson Grayer, MD 09/04/2015

## 2015-09-04 NOTE — Progress Notes (Signed)
Insurance check completed on Eliquis-  Pt copay will be $25- prior auth not required 30 day free card and copay assist card given to pt to use post rehab stay

## 2015-09-05 ENCOUNTER — Encounter (HOSPITAL_COMMUNITY): Payer: Self-pay | Admitting: *Deleted

## 2015-09-05 ENCOUNTER — Inpatient Hospital Stay (HOSPITAL_COMMUNITY)
Admission: RE | Admit: 2015-09-05 | Discharge: 2015-09-14 | DRG: 057 | Disposition: A | Discharge status: Home / Self Care | Payer: BLUE CROSS/BLUE SHIELD | Source: Intra-hospital | Attending: Physical Medicine & Rehabilitation | Admitting: Physical Medicine & Rehabilitation

## 2015-09-05 DIAGNOSIS — I82442 Acute embolism and thrombosis of left tibial vein: Secondary | ICD-10-CM

## 2015-09-05 DIAGNOSIS — N179 Acute kidney failure, unspecified: Secondary | ICD-10-CM

## 2015-09-05 DIAGNOSIS — I10 Essential (primary) hypertension: Secondary | ICD-10-CM | POA: Diagnosis not present

## 2015-09-05 DIAGNOSIS — I119 Hypertensive heart disease without heart failure: Secondary | ICD-10-CM | POA: Diagnosis not present

## 2015-09-05 DIAGNOSIS — Z87820 Personal history of traumatic brain injury: Secondary | ICD-10-CM | POA: Diagnosis not present

## 2015-09-05 DIAGNOSIS — B9620 Unspecified Escherichia coli [E. coli] as the cause of diseases classified elsewhere: Secondary | ICD-10-CM | POA: Diagnosis present

## 2015-09-05 DIAGNOSIS — H3530 Unspecified macular degeneration: Secondary | ICD-10-CM

## 2015-09-05 DIAGNOSIS — Z79899 Other long term (current) drug therapy: Secondary | ICD-10-CM

## 2015-09-05 DIAGNOSIS — K5900 Constipation, unspecified: Secondary | ICD-10-CM | POA: Diagnosis not present

## 2015-09-05 DIAGNOSIS — N390 Urinary tract infection, site not specified: Secondary | ICD-10-CM | POA: Diagnosis present

## 2015-09-05 DIAGNOSIS — N189 Chronic kidney disease, unspecified: Secondary | ICD-10-CM | POA: Diagnosis present

## 2015-09-05 DIAGNOSIS — D72829 Elevated white blood cell count, unspecified: Secondary | ICD-10-CM

## 2015-09-05 DIAGNOSIS — F419 Anxiety disorder, unspecified: Secondary | ICD-10-CM | POA: Diagnosis not present

## 2015-09-05 DIAGNOSIS — F319 Bipolar disorder, unspecified: Secondary | ICD-10-CM

## 2015-09-05 DIAGNOSIS — I69398 Other sequelae of cerebral infarction: Secondary | ICD-10-CM

## 2015-09-05 DIAGNOSIS — I69319 Unspecified symptoms and signs involving cognitive functions following cerebral infarction: Secondary | ICD-10-CM | POA: Diagnosis present

## 2015-09-05 DIAGNOSIS — N289 Disorder of kidney and ureter, unspecified: Secondary | ICD-10-CM

## 2015-09-05 DIAGNOSIS — I6389 Other cerebral infarction: Secondary | ICD-10-CM | POA: Diagnosis present

## 2015-09-05 DIAGNOSIS — I82492 Acute embolism and thrombosis of other specified deep vein of left lower extremity: Secondary | ICD-10-CM | POA: Diagnosis not present

## 2015-09-05 DIAGNOSIS — G931 Anoxic brain damage, not elsewhere classified: Secondary | ICD-10-CM | POA: Diagnosis not present

## 2015-09-05 DIAGNOSIS — I469 Cardiac arrest, cause unspecified: Secondary | ICD-10-CM

## 2015-09-05 DIAGNOSIS — M549 Dorsalgia, unspecified: Secondary | ICD-10-CM

## 2015-09-05 DIAGNOSIS — R269 Unspecified abnormalities of gait and mobility: Secondary | ICD-10-CM | POA: Diagnosis present

## 2015-09-05 DIAGNOSIS — Z95810 Presence of automatic (implantable) cardiac defibrillator: Secondary | ICD-10-CM

## 2015-09-05 DIAGNOSIS — F3170 Bipolar disorder, currently in remission, most recent episode unspecified: Secondary | ICD-10-CM

## 2015-09-05 DIAGNOSIS — I638 Other cerebral infarction: Secondary | ICD-10-CM

## 2015-09-05 DIAGNOSIS — I5021 Acute systolic (congestive) heart failure: Secondary | ICD-10-CM | POA: Diagnosis present

## 2015-09-05 HISTORY — DX: Urinary tract infection, site not specified: N39.0

## 2015-09-05 HISTORY — DX: Unspecified Escherichia coli (E. coli) as the cause of diseases classified elsewhere: B96.20

## 2015-09-05 LAB — GLUCOSE, CAPILLARY
GLUCOSE-CAPILLARY: 71 mg/dL (ref 65–99)
GLUCOSE-CAPILLARY: 74 mg/dL (ref 65–99)
GLUCOSE-CAPILLARY: 68 mg/dL (ref 65–99)
Glucose-Capillary: 142 mg/dL — ABNORMAL HIGH (ref 65–99)
Glucose-Capillary: 49 mg/dL — ABNORMAL LOW (ref 65–99)

## 2015-09-05 LAB — BASIC METABOLIC PANEL
ANION GAP: 12 (ref 5–15)
BUN: 27 mg/dL — AB (ref 6–20)
CHLORIDE: 102 mmol/L (ref 101–111)
CO2: 25 mmol/L (ref 22–32)
Calcium: 9.2 mg/dL (ref 8.9–10.3)
Creatinine, Ser: 0.94 mg/dL (ref 0.44–1.00)
GFR calc Af Amer: >60 mL/min (ref >60)
GFR calc non Af Amer: >60 mL/min (ref >60)
GLUCOSE: 71 mg/dL (ref 65–99)
POTASSIUM: 5.9 mmol/L — AB (ref 3.5–5.1)
Sodium: 139 mmol/L (ref 135–145)

## 2015-09-05 LAB — POTASSIUM: POTASSIUM: 4.4 mmol/L (ref 3.5–5.1)

## 2015-09-05 LAB — CBC
HCT: 37.1 % (ref 36.0–46.0)
HEMOGLOBIN: 11.5 g/dL — AB (ref 12.0–15.0)
MCH: 28.6 pg (ref 26.0–34.0)
MCHC: 31 g/dL (ref 30.0–36.0)
MCV: 92.3 fL (ref 78.0–100.0)
Platelets: 528 10*3/uL — ABNORMAL HIGH (ref 150–400)
RBC: 4.02 MIL/uL (ref 3.87–5.11)
RDW: 15.4 % (ref 11.5–15.5)
WBC: 14.6 10*3/uL — AB (ref 4.0–10.5)

## 2015-09-05 LAB — MAGNESIUM: MAGNESIUM: 2.2 mg/dL (ref 1.7–2.4)

## 2015-09-05 MED ORDER — PROCHLORPERAZINE EDISYLATE 5 MG/ML IJ SOLN: 5.0000 mg | Freq: Four times a day (QID) | INTRAMUSCULAR | Status: DC | PRN

## 2015-09-05 MED ORDER — DIPHENHYDRAMINE HCL 12.5 MG/5ML PO ELIX
12.5000 mg | ORAL_SOLUTION | Freq: Four times a day (QID) | ORAL | Status: DC | PRN
  Administered 2015-09-10 – 2015-09-14 (×5): 25 mg via ORAL
  Filled 2015-09-05 (×5): qty 10

## 2015-09-05 MED ORDER — CARVEDILOL 6.25 MG PO TABS
6.2500 mg | ORAL_TABLET | Freq: Two times a day (BID) | ORAL | Status: DC
  Administered 2015-09-06 – 2015-09-14 (×17): 6.25 mg via ORAL
  Filled 2015-09-05 (×17): qty 1

## 2015-09-05 MED ORDER — PROCHLORPERAZINE MALEATE 5 MG PO TABS: 5.0000 mg | ORAL_TABLET | Freq: Four times a day (QID) | ORAL | Status: DC | PRN

## 2015-09-05 MED ORDER — CEFUROXIME AXETIL 500 MG PO TABS
500.0000 mg | ORAL_TABLET | Freq: Two times a day (BID) | ORAL | Status: AC
  Administered 2015-09-06 – 2015-09-08 (×6): 500 mg via ORAL
  Filled 2015-09-05 (×6): qty 1

## 2015-09-05 MED ORDER — OCUVITE-LUTEIN PO CAPS
1.0000 | ORAL_CAPSULE | Freq: Every day | ORAL | Status: DC
  Administered 2015-09-06 – 2015-09-07 (×2): 1 via ORAL
  Filled 2015-09-05 (×4): qty 1

## 2015-09-05 MED ORDER — BENZONATATE 100 MG PO CAPS
100.0000 mg | ORAL_CAPSULE | Freq: Three times a day (TID) | ORAL | Status: DC | PRN
Start: 2015-09-05 — End: 2015-09-14
  Administered 2015-09-08 – 2015-09-14 (×12): 100 mg via ORAL
  Filled 2015-09-05 (×13): qty 1

## 2015-09-05 MED ORDER — DM-GUAIFENESIN ER 30-600 MG PO TB12
1.0000 | ORAL_TABLET | Freq: Two times a day (BID) | ORAL | Status: DC
  Administered 2015-09-05 – 2015-09-14 (×18): 1 via ORAL
  Filled 2015-09-05 (×19): qty 1

## 2015-09-05 MED ORDER — POLYVINYL ALCOHOL 1.4 % OP SOLN
2.0000 [drp] | OPHTHALMIC | Status: DC | PRN
  Filled 2015-09-05: qty 15

## 2015-09-05 MED ORDER — OXCARBAZEPINE 300 MG PO TABS
300.0000 mg | ORAL_TABLET | Freq: Two times a day (BID) | ORAL | Status: DC
  Administered 2015-09-05 – 2015-09-14 (×18): 300 mg via ORAL
  Filled 2015-09-05 (×18): qty 1

## 2015-09-05 MED ORDER — BISACODYL 10 MG RE SUPP: 10.0000 mg | Freq: Every day | RECTAL | Status: DC | PRN

## 2015-09-05 MED ORDER — FLEET ENEMA 7-19 GM/118ML RE ENEM: 1.0000 | ENEMA | Freq: Once | RECTAL | Status: DC | PRN

## 2015-09-05 MED ORDER — VENLAFAXINE HCL ER 75 MG PO CP24
75.0000 mg | ORAL_CAPSULE | Freq: Every day | ORAL | Status: DC
Start: 2015-09-06 — End: 2015-09-14
  Administered 2015-09-06 – 2015-09-14 (×9): 75 mg via ORAL
  Filled 2015-09-05 (×9): qty 1

## 2015-09-05 MED ORDER — GUAIFENESIN-DM 100-10 MG/5ML PO SYRP
5.0000 mL | ORAL_SOLUTION | Freq: Four times a day (QID) | ORAL | Status: DC | PRN
  Filled 2015-09-05 (×2): qty 10

## 2015-09-05 MED ORDER — AMLODIPINE BESYLATE 5 MG PO TABS
5.0000 mg | ORAL_TABLET | Freq: Every day | ORAL | Status: DC
  Administered 2015-09-06 – 2015-09-14 (×9): 5 mg via ORAL
  Filled 2015-09-05 (×9): qty 1

## 2015-09-05 MED ORDER — APIXABAN 5 MG PO TABS
10.0000 mg | ORAL_TABLET | Freq: Two times a day (BID) | ORAL | Status: AC
  Administered 2015-09-05 – 2015-09-10 (×11): 10 mg via ORAL
  Filled 2015-09-05 (×11): qty 2

## 2015-09-05 MED ORDER — ALPRAZOLAM 0.25 MG PO TABS
0.2500 mg | ORAL_TABLET | Freq: Every evening | ORAL | Status: DC | PRN
  Administered 2015-09-05 – 2015-09-12 (×7): 0.25 mg via ORAL
  Filled 2015-09-05 (×7): qty 1

## 2015-09-05 MED ORDER — ONDANSETRON HCL 4 MG/2ML IJ SOLN: 4.0000 mg | Freq: Four times a day (QID) | INTRAMUSCULAR | Status: DC | PRN

## 2015-09-05 MED ORDER — CEFUROXIME AXETIL 500 MG PO TABS
500.0000 mg | ORAL_TABLET | Freq: Two times a day (BID) | ORAL | Status: DC
  Administered 2015-09-05: 500 mg via ORAL
  Filled 2015-09-05: qty 1

## 2015-09-05 MED ORDER — ACETAMINOPHEN 325 MG PO TABS
325.0000 mg | ORAL_TABLET | ORAL | Status: DC | PRN
  Administered 2015-09-05 – 2015-09-14 (×26): 650 mg via ORAL
  Filled 2015-09-05 (×26): qty 2

## 2015-09-05 MED ORDER — APIXABAN 5 MG PO TABS
5.0000 mg | ORAL_TABLET | Freq: Two times a day (BID) | ORAL | Status: DC
  Administered 2015-09-11 – 2015-09-14 (×7): 5 mg via ORAL
  Filled 2015-09-05 (×7): qty 1

## 2015-09-05 MED ORDER — AMLODIPINE BESYLATE 5 MG PO TABS
5.0000 mg | ORAL_TABLET | Freq: Every day | ORAL | Status: DC
  Administered 2015-09-05: 5 mg via ORAL
  Filled 2015-09-05: qty 1

## 2015-09-05 MED ORDER — ALUM & MAG HYDROXIDE-SIMETH 200-200-20 MG/5ML PO SUSP: 30.0000 mL | ORAL | Status: DC | PRN

## 2015-09-05 MED ORDER — SENNOSIDES-DOCUSATE SODIUM 8.6-50 MG PO TABS: 1.0000 | ORAL_TABLET | Freq: Every evening | ORAL | Status: DC | PRN

## 2015-09-05 MED ORDER — NAPHAZOLINE-GLYCERIN 0.012-0.2 % OP SOLN
1.0000 [drp] | Freq: Three times a day (TID) | OPHTHALMIC | Status: DC
  Administered 2015-09-06 (×2): 1 [drp] via OPHTHALMIC
  Administered 2015-09-06: 2 [drp] via OPHTHALMIC
  Administered 2015-09-07 (×2): 1 [drp] via OPHTHALMIC
  Administered 2015-09-08 – 2015-09-09 (×3): 2 [drp] via OPHTHALMIC
  Administered 2015-09-09: 1 [drp] via OPHTHALMIC
  Administered 2015-09-10 – 2015-09-12 (×6): 2 [drp] via OPHTHALMIC
  Administered 2015-09-12 – 2015-09-13 (×3): 1 [drp] via OPHTHALMIC
  Administered 2015-09-13 – 2015-09-14 (×2): 2 [drp] via OPHTHALMIC
  Filled 2015-09-05: qty 15

## 2015-09-05 MED ORDER — PROCHLORPERAZINE 25 MG RE SUPP: 12.5000 mg | Freq: Four times a day (QID) | RECTAL | Status: DC | PRN

## 2015-09-05 MED ORDER — ACETAMINOPHEN 325 MG PO TABS: 325.0000 mg | ORAL_TABLET | ORAL | Status: DC | PRN

## 2015-09-05 MED ORDER — ATORVASTATIN CALCIUM 20 MG PO TABS
20.0000 mg | ORAL_TABLET | Freq: Every day | ORAL | Status: DC
  Administered 2015-09-06 – 2015-09-13 (×8): 20 mg via ORAL
  Filled 2015-09-05 (×8): qty 1

## 2015-09-05 MED ORDER — FAMOTIDINE 20 MG PO TABS
20.0000 mg | ORAL_TABLET | Freq: Every day | ORAL | Status: DC
  Administered 2015-09-06 – 2015-09-14 (×9): 20 mg via ORAL
  Filled 2015-09-05 (×9): qty 1

## 2015-09-05 NOTE — Discharge Summary (Signed)
Physician Discharge Summary  Marie Ramos [id number] DOB: July 25, 1955 DOA: 08/24/2015  PCP: No primary care provider on file.  Admit date: 08/24/2015 Discharge date: 09/05/2015  Time spent: 65 minutes  Recommendations for Outpatient Follow-up:  1.  Patient will be discharged to the inpatient rehabilitation center at [facility].   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 She was in the cafeteria and lost her consciousness. School nurse was available immediately and started CPR and placed her on AED,from which she received 2 shocks. EMS arrived and found the Patient in PEA arrest.. One shot of epinephrine was given and the patient regained her pulse back, later patient's rhythm converted into SVT. She was shocked and was converted to NSR at 360 joules. Her total estimated time for ROSC was approximately 10 minutes. Patient arrived to the ER on 2/17 unresponsive. She was found to become progressively dyspnea, initial abg showed P/F ration of 193, co2 retention and ph 7.2, patient was intubated in the ED , stat icu and  cardiology consult and admitted to icu. Hypotermia protocol initiated in the ED  Patient had been reportedly increasing her psychiatric medications to deal with her bipolar disease. She has prolonged QTC at 530. Her cardiac arrest may be secondary to arrhythmia related to qTC prolongation  Hospital Course:  Witnessed out of hospital cardiac arrest: with shockable rhythm, ROSC after 77mins of cpr, urgent left heart cath no LAD. Cardiac arrest thought due to prolonged QTc from psych meds. Psych meds adjusted ,  s/p ICD on 09/03/2015 for secondary prevention of VF arrest. EP input appreciated.   Acute respiratory failure due to cardiac arrest required intubation from 2/17 to 2/21. Patient was initially admitted to critical care service due to acute respiratory failure secondary to cardiac arrest requiring intubation. Patient was subsequently extubated and transferred to medical service. Patient was subsequently on RA with sats of 100% ON ROOM AIR.  acute multi foci infarct found on mri brian initially ordered to evaluate anoxic brain injury due to mild cognitive impairment.  2/26Neurology recommended to order mra head without contrast, carotid doppler, TEE i have informed cardiology about neurologist request for TEE. 2/27 neurology recommended no need of TEE. A1c6.2, ldl 115, recommended asa 325 mg daily and statin.  acute deep vein thrombosis involving the left posterial tibial vein and left peroneal vein:  TCD bubble study negative for PFO on 2/28 :negative Transcranial Doppler Bubble Study indicative of no right to left shunt started on eliquis on 2/28  Bipolar: psych input appreciated, meds changed to trileptal and continued on effexor, family want  update from psych in the next few days. Dr Erlinda Hong talked to psychiatry Dr Lenna Sciara on 2/26 who recommended continuing current meds and outpatient psych follow up.  Hypokalemia, repleted.  Acute kidney injury: cr normal at baseline, today 1.21, adequate  urine output, renal dosing meds, ua showed bacteriuria, urine culture g-rods, started on rocephin on 2/28  HTN: Patient was maintained on coreg/lisinopril  Dilated hypertensive cardiomyopathy/systolic and diastolic chf:  Patient received lasix while on vent. echo lvef [id number], grade i diastolic dysfunction. currently euvolemic.   Cough: lung exam clear, no edema, no fever, no leukocytosis, cxr 2 view showed improvement, started on incentive spirometer, mucinex. Patient is newly started on lisinopril, side effect from lisinopril? Cough better  Obesity; life style modification   PrOCEDURES  Intubation 2/17/extubation 2/21 Stat echo , stat cardiac cath showed Widely patent coronary arteries  Normal LVEDP  EEG on 2/17: This is an abnormal routine inpatient EEG, suggestive of encephalopathy, likely medication effect as patient is on sedation, although cannot exclude hypoxic ischemic encephalopathy based on the clinical history of post cardiac arrest. No evidence of electrographic seizures were seen. If clinically indicated, consider repeating the EEG when off of sedation. Clinical correlation is recommended .   Right IJ placed on 2/17  Hypothermia protocol from 2/17 to 2/19  Tube feed while on vent  ICD placement on 2/27 2/28 TRANSCRANIAL DOPPLER WITH BUBBLESocedures:    Consultations:  Cardiology/EP  Critical care admit, transfer to [facility] on 2/24  Psychiatry  CIR(inpatient rehab)  neurology  Discharge Exam: Filed Vitals:   09/04/15 1917 09/05/15 0341  BP: 125/58 153/79  Pulse: 66 60  Temp: 98.4 F (36.9 C) 97.9 F (36.6 C)  Resp: 18 18    General: NAD Cardiovascular: RRR Respiratory: CTAB  Discharge Instructions   Discharge Instructions    Ambulatory referral to Neurology    Complete by:  As directed   Pt will follow up with Dr. Erlinda Hong at [facility] in about 2 months. Thanks.          Current Discharge Medication List    CONTINUE these medications which have  NOT CHANGED   Details  venlafaxine XR (EFFEXOR-XR) 75 MG 24 hr capsule Take 75 mg by mouth daily. Refills: 4       Allergies  Allergen Reactions  . Naproxen Itching  . Vicodin [Hydrocodone-Acetaminophen] Itching   Follow-up Information    Follow up with [facility] On 09/19/2015.   Specialty:  Cardiology   Why:  3:30PM wound check    Contact information:   [address] [place] [phone]      Follow up with  Grayer, MD On 11/28/2015.   Specialty:  Cardiology   Why:  9:45AM   Contact information:   [place] [address] [phone]       Follow up with Xu,Jindong, MD. Schedule an appointment as soon as possible for a visit in 2 months.   Specialty:  Neurology   Why:  stroke clinic   Contact information:   [address] [place] [address] [phone]        The results of significant diagnostics from this hospitalization (including imaging, microbiology, ancillary and laboratory) are listed below for reference.    Significant Diagnostic Studies: Dg Chest 2 View  09/04/2015  CLINICAL DATA:  Pacemaker inserted yesterday EXAM: CHEST  2 VIEW COMPARISON:  09/01/2015 FINDINGS: Cardiomediastinal silhouette is stable. There is dual  lead cardiac pacemaker with left subclavian approach with leads in right atrium and right ventricle. There is no pneumothorax. No infiltrate or pulmonary edema. Old left rib fractures are again noted. IMPRESSION: No active cardiopulmonary disease. Dual lead cardiac pacemaker in place with leads in right atrium and right ventricle. No pneumothorax. Electronically Signed   By: Lahoma Crocker M.D.   On: 09/04/2015 08:14   Dg Chest 2 View  09/01/2015  CLINICAL DATA:  Patient recently extubated, now with recurrent cough and shortness of breath. EXAM: CHEST  2 VIEW COMPARISON:  08/28/2015 and earlier. FINDINGS: Interval extubation and nasogastric  tube removal. Cardiac silhouette markedly enlarged, unchanged. Interval resolution of pulmonary venous hypertension and pulmonary edema. Pulmonary vascularity now normal. Improved aeration in the left lower lobe, with only minimal patchy opacities persisting. Lungs otherwise clear. IMPRESSION: 1. Interval marked improvement in aeration in the left lower lobe since the examination 4 days ago, with only mild residual atelectasis and/or pneumonia. No acute cardiopulmonary disease otherwise. 2. Interval resolution of pulmonary venous hypertension and pulmonary edema. Electronically Signed   By: Evangeline Dakin M.D.   On: 09/01/2015 15:55   Ct Head Wo Contrast  08/24/2015  CLINICAL DATA:  Altered mental status.  Patient is intubated. EXAM: CT HEAD WITHOUT CONTRAST TECHNIQUE: Contiguous axial images were obtained from the base of the skull through the vertex without contrast. COMPARISON:  None FINDINGS: No evidence for acute hemorrhage, mass lesion, midline shift, hydrocephalus or large infarct. Mucosal thickening in the ethmoid air cells. No calvarial fracture. IMPRESSION: No acute intracranial abnormality. Electronically Signed   By: Markus Daft M.D.   On: 08/24/2015 15:24   Mr [name] Head Wo Contrast  09/02/2015  CLINICAL DATA:  CVA.  Cardiac arrest.  Abnormal MRI scan. EXAM: MRA HEAD WITHOUT CONTRAST TECHNIQUE: Angiographic images of the Circle of Willis were obtained using MRA technique without intravenous contrast. COMPARISON:  MRI brain 09/01/2015 FINDINGS: The internal carotid arteries are within normal limits from the high cervical segments through the ICA termini bilaterally. The A1 and M1 segments are within normal limits. There is mild irregularity within the proximal a 2 segments. There is an early bifurcation of the proximal right anterior cerebral artery, a normal variant. No definite anterior communicating artery is present. The MCA bifurcations are intact. The visualized ACA and MCA branch vessels are  within normal limits. The vertebral arteries are codominant. The PICA origins are not visualized. The PICA origins are visualized and normal. Both posterior cerebral arteries originate from the basilar tip. There is mild attenuation of proximal PCA vessels bilaterally. The distal PCA branch vessels are intact. IMPRESSION: 1. Mild atherosclerotic irregularity without a significant proximal stenosis, aneurysm, or branch vessel occlusion to account for the patient's acute infarcts. Electronically Signed   By: San Morelle M.D.   On: 09/02/2015 18:12   Mr Brain Wo Contrast  09/01/2015  CLINICAL DATA:  Cardiac arrest, recovered after epinephrine injection. Progressive dyspnea. Evaluate anoxic brain injury. EXAM: MRI HEAD WITHOUT CONTRAST TECHNIQUE: Multiplanar, multiecho pulse sequences of the brain and surrounding structures were obtained without intravenous contrast. COMPARISON:  CT head August 24, 2015 FINDINGS: Scattered foci of reduced diffusion include LEFT cerebellum, RIGHT caudate head, RIGHT insula, RIGHT periatrial white matter, LEFT occipital lobe, RIGHT parietal lobe measuring up to 11 mm. Larger areas demonstrate reduced diffusion. No susceptibility artifact to suggest hemorrhage. Additional patchy supratentorial white matter FLAIR T2 hyperintensities exclusive of aforementioned abnormality. Ventricles and sulci are normal for patient's age. No midline shift,  mass effect or masses. No abnormal extra-axial fluid collections. Normal major intracranial vascular flow voids present at skull base. Ocular globes and orbital contents are normal. Moderate pan paranasal sinus mucosal thickening. Small bilateral mastoid effusions. No abnormal sellar expansion. No cerebellar tonsillar ectopia. No suspicious calvarial bone marrow signal. IMPRESSION: Scattered supra and infratentorial foci of acute ischemia measuring up to 11 mm, which may represent embolic phenomena, possible component of watershed ischemia.  Mild chronic small vessel ischemic disease. Electronically Signed   By: Elon Alas M.D.   On: 09/01/2015 22:12   Dg Chest Port 1 View  08/28/2015  CLINICAL DATA:  Endotracheal tube position EXAM: PORTABLE CHEST 1 VIEW COMPARISON:  08/27/2015 FINDINGS: Endotracheal tube is in good position approximately 3 cm above the carina. NG tube in place with the tip not visualized. Right jugular catheter tip at the cavoatrial junction. No pneumothorax Improvement in bibasilar airspace disease compared with the prior study. There remains left greater than right bibasilar airspace disease. No significant pleural effusion. IMPRESSION: Endotracheal tube now in good position Improved aeration with decrease in bibasilar airspace disease compared with yesterday Electronically Signed   By: Franchot Gallo M.D.   On: 08/28/2015 07:12   Dg Chest Port 1 View  08/27/2015  CLINICAL DATA:  Acute respiratory failure. EXAM: PORTABLE CHEST 1 VIEW COMPARISON:  08/26/2015. FINDINGS: Endotracheal tube, NG tube, right IJ line stable position. Cardiomegaly with slight increase in pulmonary venous congestion and bibasilar infiltrates. Bilateral pleural effusions. Findings consistent congestive heart failure. Findings have worsened from prior exam . IMPRESSION: 1. Lines and tubes in stable position. 2. Cardiomegaly with increase pulmonary venous congestion and bibasilar infiltrates. Bilateral small pleural effusions noted. Findings consistent with worsening congestive heart failure. Electronically Signed   By: Marcello Moores  Register   On: 08/27/2015 07:04   Dg Chest Port 1 View  08/26/2015  CLINICAL DATA:  Acute respiratory failure. Intubated patient. Followup exam. EXAM: PORTABLE CHEST 1 VIEW COMPARISON:  08/24/2015 FINDINGS: Endotracheal tube projects higher than it did on the prior study, 5.4 cm above the carina. Right internal jugular central venous line and oral/nasogastric tube are stable and well positioned. Lung aeration is improved.  There has been a decrease in the interstitial and airspace opacities. Mild persistent lung base opacity is noted, mostly on the left, likely atelectasis. IMPRESSION: 1. Significant improvement in lung aeration since the prior study consistent with improved pulmonary edema. 2. Endotracheal tube now projects higher above the carinal than it did previously, 5.4 cm. 3. No other change. Electronically Signed   By: Lajean Manes M.D.   On: 08/26/2015 07:25   Dg Chest Portable 1 View  08/24/2015  CLINICAL DATA:  60 year old female status post cardiac arrest. A central line placement and endotracheal tube repositioning. EXAM: PORTABLE CHEST 1 VIEW COMPARISON:  Prior chest x-ray obtained earlier today at 13:25 p.m. FINDINGS: The endotracheal tube has been retracted and is now 2.7 cm above the carina. Right IJ central venous catheter is been placed. The catheter tip overlies the superior cavoatrial junction. Stable cardiomegaly. Slight interval progression of pulmonary edema. Unchanged orogastric tube. Again suspect nondisplaced left rib fractures. IMPRESSION: 1. New right IJ central venous catheter the tip of which overlies the superior cavoatrial junction. No evidence of pneumothorax or other complication. 2. Repositioning of endotracheal tube which is now 2.7 cm above the carina. 3. Slight interval progression of pulmonary edema. Electronically Signed   By: Jacqulynn Cadet M.D.   On: 08/24/2015 15:00   Dg Chest [facility]  1 View  08/24/2015  CLINICAL DATA:  60 year old female status post cardiac arrest and intubation EXAM: PORTABLE CHEST 1 VIEW COMPARISON:  None FINDINGS: Right mainstem intubation. An orogastric tube is present and is coiled within the stomach. External defibrillator pads project over the chest. Mild cardiomegaly. Pulmonary vascular congestion with mild edema. Probable bibasilar atelectasis. No pneumothorax or large pleural effusion. Slight irregularity of the lateral aspect of several left-sided ribs.  Nondisplaced rib fractures not excluded. IMPRESSION: 1. Right mainstem intubation. Recommend withdrawing the endotracheal tube 4.5 cm for more optimal placement above the carina. 2. Cardiomegaly and mild pulmonary vascular congestion suggests CHF. 3. Well-positioned gastric tube. 4. Subtle irregularities the lateral aspect of several left-sided ribs. Nondisplaced fractures are not excluded. These results were called by telephone at the time of interpretation on 08/24/2015 at 1:41 pm to Dr. Lajean Saver , who verbally acknowledged these results. Electronically Signed   By: Jacqulynn Cadet M.D.   On: 08/24/2015 13:41   Mr Card Morphology Wo/w Cm  08/31/2015  CLINICAL DATA:  60 year old female post cardiac arrest with no evidence of CAD on a cardiac catheterization. EXAM: CARDIAC MRI TECHNIQUE: The patient was scanned on a 1.5 Tesla GE magnet. A dedicated cardiac coil was used. Functional imaging was done using Fiesta sequences. 2,3, and 4 chamber views were done to assess for RWMA's. Modified Simpson's rule using a short axis stack was used to calculate an ejection fraction on a dedicated work Conservation officer, nature. The patient received 30 cc of Multihance. After 10 minutes inversion recovery sequences were used to assess for infiltration and scar tissue. CONTRAST:  30 cc  of Multihance FINDINGS: 1. Moderate left ventricular dilatation with moderate concentric hypertrophy and mildly decreased systolic function (LVEF = 45%) with diffuse hypokinesis. There is no late gadolinium enhancement in the left ventricular myocardium. LVEDD:  63 mm LVESD: LVEDV:  141 ml LVESV:  78 ml SV:  63 ml CO:  4.1 L/minute Myocardial mass:  163 g 2. Normal right ventricular size, thickness and systolic function (RVEF = 58%). 3.  Mildly dilated left atrium. 4.  Mild mitral and tricuspid regurgitation. 5. Normal size of the aortic root, all portions of the thoracic aorta and of the pulmonary artery. IMPRESSION: 1. Moderate left  ventricular dilatation with moderate concentric hypertrophy and mildly decreased systolic function (LVEF = 45%) with diffuse hypokinesis. 2. Normal right ventricular size, thickness and systolic function (RVEF = 58%). 3. Mildly dilated left atrium. 4.  Mild mitral and tricuspid regurgitation. 5. No late gadolinium enhancement in the left ventricular myocardium. Conclusively, these findings are suggestive of dilated hypertensive cardiomyopathy rather than hypertrophic cardiomyopathy. Ena Dawley Electronically Signed   By: Ena Dawley   On: 08/31/2015 18:32    Microbiology: Recent Results (from the past 240 hour(s))  Culture, Urine     Status: None (Preliminary result)   Collection Time: 09/01/15  7:55 PM  Result Value Ref Range Status   Specimen Description URINE, RANDOM  Final   Special Requests NONE  Final   Culture   Final    >=100,000 COLONIES/mL GRAM NEGATIVE RODS CULTURE REINCUBATED FOR BETTER GROWTH    Report Status PENDING  Incomplete     Labs: Basic Metabolic Panel:  Recent Labs Lab 08/30/15 0615 08/31/15 0300  09/02/15 0349 09/03/15 0248 09/04/15 0455 09/04/15 1025 09/05/15 0347 09/05/15 1003  NA 139 138  < > 143 138 139 137 139  --   K 3.0* 3.2*  < > 4.5 4.5 5.2*  4.1 5.9* 4.4  CL 101 102  < > 105 103 103 102 102  --   CO2 26 24  < > 26 27 26 27 25   --   GLUCOSE 118* 86  < > 101* 99 97 137* 71  --   BUN 50* 57*  < > 32* 26* 30* 26* 27*  --   CREATININE 1.26* 1.21*  < > 1.10* 0.89 0.95 0.99 0.94  --   CALCIUM 9.1 9.2  < > 9.1 9.1 9.1 8.9 9.2  --   MG 2.1 2.4  --   --   --   --   --  2.2  --   PHOS 4.8* 4.1  --   --   --   --   --   --   --   < > = values in this interval not displayed. Liver Function Tests:  Recent Labs Lab 09/02/15 0349  AST 26  ALT 42  ALKPHOS 94  BILITOT 0.3  PROT 5.7*  ALBUMIN 2.5*   No results for input(s): LIPASE, AMYLASE in the last 168 hours. No results for input(s): AMMONIA in the last 168 hours. CBC:  Recent  Labs Lab 08/30/15 0615 08/31/15 0300 09/01/15 0427 09/02/15 0349 09/03/15 0248 09/04/15 0455 09/04/15 1025 09/05/15 0347  WBC 13.7* 13.3* 12.8* 13.6* 14.1* 17.2* 16.5* 14.6*  NEUTROABS 11.6* 8.8* 8.5*  --   --   --   --   --   HGB 12.8 12.8 12.9 11.8* 11.9* 12.1 11.9* 11.5*  HCT 39.5 39.6 39.9 37.9 38.7 38.8 36.9 37.1  MCV 89.2 91.2 91.1 92.0 92.8 92.2 92.0 92.3  PLT 516* 450* 489* 516* 553* 539* 514* 528*   Cardiac Enzymes: No results for input(s): CKTOTAL, CKMB, CKMBINDEX, TROPONINI in the last 168 hours. BNP: BNP (last 3 results) No results for input(s): BNP in the last 8760 hours.  ProBNP (last 3 results) No results for input(s): PROBNP in the last 8760 hours.  CBG:  Recent Labs Lab 09/04/15 2116 09/05/15 0007 09/05/15 0356 09/05/15 0607 09/05/15 1153  GLUCAP 180* 142* 71 74 49*       Signed:  Sybol Morre MD.  [facility] 09/05/2015, 1:54 PM

## 2015-09-05 NOTE — Progress Notes (Signed)
ANTICOAGULATION CONSULT NOTE - Consult  Pharmacy Consult for Apixaban Indication: DVT  Allergies  Allergen Reactions  . Naproxen Itching  . Vicodin [Hydrocodone-Acetaminophen] Itching    Patient Measurements: Height: 5\' 2"  (157.5 cm) Weight: 227 lb 3.2 oz (103.057 kg) IBW/kg (Calculated) : 50.1  Vital Signs: Temp: 97.7 F (36.5 C) (03/01 1831) Temp Source: Oral (03/01 1831) BP: 120/57 mmHg (03/01 1831) Pulse Rate: 75 (03/01 1831)  Labs:  Recent Labs  09/03/15 0248 09/04/15 0455 09/04/15 1025 09/05/15 0347  HGB 11.9* 12.1 11.9* 11.5*  HCT 38.7 38.8 36.9 37.1  PLT 553* 539* 514* 528*  LABPROT 14.3  --   --   --   INR 1.09  --   --   --   CREATININE 0.89 0.95 0.99 0.94    Estimated Creatinine Clearance: 71.6 mL/min (by C-G formula based on Cr of 0.94).   Medical History: Past Medical History  Diagnosis Date  . Bipolar 1 disorder ([place])   . Hypertrophic obstructive cardiomyopathy ([place]) 08/29/2015  . Cardiac arrest ([place]) 08/24/2015  . QT prolongation 08/29/2015  . TBI (traumatic brain injury) [facility]) 2011    Inpatient rehab [facility]  . Macular degeneration   . Chronically dry eyes   . Ventricular fibrillation ([place]) 09/03/15    MDT ICD Dr. Rayann Heman   Assessment: 18 yof found to have a DVT in her left posterior tibial and left peroneal veins. She was started on apixaban and is being admitted to rehab  Goal of Therapy:  Monitor platelets by anticoagulation protocol: Yes   Plan:  Apixaban 10mg  bid x 7 days then 5mg  bid (started 2/28) Monitor for s/sx of bleeding   Thank you for allowing [name] to participate in this patients care. Jens Som, PharmD Pager: [phone] 09/05/2015,6:35 PM

## 2015-09-05 NOTE — Progress Notes (Signed)
Nutrition Follow-up  DOCUMENTATION CODES:   Obesity unspecified  INTERVENTION:   Encourage meals and snacks.   NUTRITION DIAGNOSIS:   Inadequate oral intake related to inability to eat as evidenced by NPO status. Resolved.   GOAL:   Patient will meet greater than or equal to 90% of their needs Met.   MONITOR:   PO intake  ASSESSMENT:   60 years old female was admitted on 08/24/15 with cardiac arrest ROSC estimated at 10 minutes. Emergent left heart cath with no disease found and hypothermia protocol. Suspect medication caused prolonged Qtc.  Patient extubated 2/21. Continues on Heart Healthy diet. PO intake 100% per flowsheet records. Disposition: CIR.  Diet Order:  Diet Heart Room service appropriate?: Yes; Fluid consistency:: Thin  Skin:  Reviewed, no issues  Last BM:  2/28  Height:   Ht Readings from Last 1 Encounters:  08/24/15 _0  (1.702 m)    Weight:   Wt Readings from Last 1 Encounters:  09/04/15 221 lb (100.245 kg)    Ideal Body Weight:  61.36 kg  BMI:  Body mass index is 34.61 kg/(m^2).  Estimated Nutritional Needs:   Kcal:  1600-1800  Protein:  100-110 grams  Fluid:  > 1.6 L/day  EDUCATION NEEDS:   No education needs identified at this time  Arthur Holms, RD, LDN Pager #: [phone] After-Hours Pager #: [phone]

## 2015-09-05 NOTE — Interval H&P Note (Deleted)
Marie Ramos was admitted today to Inpatient Rehabilitation with the diagnosis of Cardiac arrest with hypoxic encephalopathy and embolic/watershed stroke.  The patient's history has been reviewed, patient examined, and there is no change in status.  Patient continues to be appropriate for intensive inpatient rehabilitation.  I have reviewed the patient's chart and labs.  Questions were answered to the patient's satisfaction. The PAPE has been reviewed and assessment remains appropriate.  Ankit [name] 09/05/2015, 9:27 PM

## 2015-09-05 NOTE — Progress Notes (Signed)
Marie Ramos Rehab Admission Coordinator Signed Physical Medicine and Rehabilitation PMR Pre-admission 09/05/2015 4:46 PM  Related encounter: ED to Hosp-Admission (Current) from 08/24/2015 in [place] Collapse All   PMR Admission Coordinator Pre-Admission Assessment  Patient: Marie Ramos is an 60 y.o., female MRN: [id number] DOB: 1955/07/21 Height: 5\' 7"  (170.2 cm) Weight: 100.245 kg (221 lb)  Insurance Information HMO: PPO: yes PCP: IPA: 80/20: OTHER:  PRIMARY: BC[employer] Advantage Silver Policy#: [id number] Subscriber: self CM Name: Merrilyn Puma Phone#: [phone] Fax#: [phone] Pre-Cert#: [id number], approved for 09/05/15-09/11/15 with expected DC of 3/817 with clinical updates due 09/11/15 Employer:  Benefits: Phone #: [phone] Name: Abby Eff. Date: 07/08/15 Deduct: $3000 Out of Pocket Max: $5700 Life Max: n/a CIR: 70%/30% SNF: 70%/30% Outpatient: $40 copay  Home Health: 70% Co-Pay: 30% DME: 70% Co-Pay: 30% Providers: In network SECONDARY: Policy#: Subscriber:  CM Name: Phone#: Fax#:  Pre-Cert#: Employer:  Benefits: Phone #: Name:  Eff. Date: Deduct: Out of Pocket Max: Life Max:  CIR: SNF:  Outpatient: Co-Pay:  Home Health: Co-Pay:  DME: Co-Pay:   Medicaid Application Date: Case Manager:  Disability Application Date: Case Worker:   Emergency Contact Information Contact Information    Name Relation Home Work Mobile   [place] P  [phone]     [facility] [place] Daughter   [phone]   Karmella, Ritchison   [phone]     Current Medical  History  Patient Admitting Diagnosis: Cardiac arrest with hypoxic encephalopathy and embolic/watershed stroke History of Present Illness: Marie Ramos is a 60 y.o. female with history of fall with TBI/splenectomy, bipolar disorder with recent increase in psychiatric medications who was admitted on 08/24/15 after witnessed cardiac arrest. Patient unresponsive and pulseless and AED placed by school nurse with 2 shocks and CPR initiated. EMS evaluation showed PEA and ROSC after epinephrine--down time 10 minutes. Initial rhythm presumed to be VT. Patient intubated and cardiac cath was negative for CAD. She was treated with hypothermia protocol and arrest felt to be due to QT elongation. 2D echo with asymmetric septal hypertrophic CM without obstruction. Cardiac MRI showed hypertensive rather than HCM with EF 45%. She was extubated without difficulty on 02/23 and Psych consulted for medication adjustment and seroquel and adderral discontinued. Trileptal added to help with mood stabilization and high levels of anxiety managed with prn Xanax. Neurology consulted due to confusion and MRI brain done revealing scattered supra and infratentorial foci of acute ischemia measuring up to 11 mm-- question embolic phenomena and possible component of watershed ischemia. MRA without significant stenosis or aneurysm. Dr. Erlinda Hong recommended ASA and statin for strokes due to SVD v/s embolic due to cardiac cath. Acute systolic dysfunction treated with coreg and ACE titration and ICD to be placed on 02/27. BLE dopplers done yesterday and revealed left posterior tibial and left peroneal veins DVT therefore patient started on Eliquis. Therapy initiated and patient noted to have impaired cognition with generalized weakness, SOB as well as decreased balance. CIR recommended for follow up therapy      Past Medical History  Past Medical History  Diagnosis Date  . Bipolar 1 disorder ([place])   . Hypertrophic obstructive  cardiomyopathy ([place]) 08/29/2015  . Cardiac arrest ([place]) 08/24/2015  . QT prolongation 08/29/2015  . TBI (traumatic brain injury) [facility]) 2011    Inpatient rehab [facility]  . Macular degeneration   . Chronically dry eyes   . Ventricular fibrillation ([place]) 09/03/15    MDT ICD Dr. Rayann Heman  Family History  family history includes COPD in her father; Cancer in her mother.  Prior Rehab/Hospitalizations:  Has the patient had major surgery during 100 days prior to admission? No  Current Medications   Current facility-administered medications:  . 0.9 % sodium chloride infusion, 250 mL, Intravenous, PRN, Thompson Grayer, MD . acetaminophen (TYLENOL) tablet 325-650 mg, 325-650 mg, Oral, Q4H PRN, Thompson Grayer, MD, 650 mg at 09/05/15 0758 . ALPRAZolam (XANAX) tablet 0.25 mg, 0.25 mg, Oral, QHS PRN, Brand Males, MD, 0.25 mg at 08/30/15 2319 . amLODipine (NORVASC) tablet 5 mg, 5 mg, Oral, Daily, Irine Seal V, MD, 5 mg at 09/05/15 0945 . apixaban (ELIQUIS) tablet 10 mg, 10 mg, Oral, BID, Otilio Miu, RPH, 10 mg at 09/05/15 0944 . [START ON 09/11/2015] apixaban (ELIQUIS) tablet 5 mg, 5 mg, Oral, BID, Otilio Miu, RPH . atorvastatin (LIPITOR) tablet 20 mg, 20 mg, Oral, q1800, Donzetta Starch, NP, 20 mg at 09/04/15 1713 . benzonatate (TESSALON) capsule 100 mg, 100 mg, Oral, TID PRN, Florencia Reasons, MD, 100 mg at 09/05/15 0758 . carvedilol (COREG) tablet 6.25 mg, 6.25 mg, Oral, BID WC, Thompson Grayer, MD, 6.25 mg at 09/05/15 0757 . cefUROXime (CEFTIN) tablet 500 mg, 500 mg, Oral, BID WC, Eugenie Filler, MD . dextromethorphan-guaiFENesin [facility] DM) 30-600 MG per 12 hr tablet 1 tablet, 1 tablet, Oral, BID, Florencia Reasons, MD, 1 tablet at 09/05/15 0945 . famotidine (PEPCID) tablet 20 mg, 20 mg, Oral, Daily, Florencia Reasons, MD, 20 mg at 09/05/15 0945 . insulin aspart (novoLOG) injection 2-6 Units, 2-6 Units, Subcutaneous, 6 times per day, Holley Raring, NP, 2 Units at  09/05/15 0012 . ondansetron (ZOFRAN) injection 4 mg, 4 mg, Intravenous, Q6H PRN, Thompson Grayer, MD . Oxcarbazepine (TRILEPTAL) tablet 300 mg, 300 mg, Oral, BID, Ambrose Finland, MD, 300 mg at 09/05/15 0944 . polyvinyl alcohol (LIQUIFILM TEARS) 1.4 % ophthalmic solution 2 drop, 2 drop, Both Eyes, PRN, Florencia Reasons, MD, 2 drop at 09/04/15 1447 . sodium chloride flush (NS) 0.9 % injection 3 mL, 3 mL, Intravenous, Q12H, Thompson Grayer, MD, 3 mL at 09/04/15 2215 . sodium chloride flush (NS) 0.9 % injection 3 mL, 3 mL, Intravenous, PRN, Thompson Grayer, MD . 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 None of the above  Prior Functional Level Prior Function Level of Independence: Independent  Self Care: Did the patient need help bathing, dressing, using the toilet or eating? Independent  Indoor Mobility: Did the patient need assistance with walking from room to room (with or without device)? Independent  Stairs: Did the patient need assistance with internal or external stairs (with or without device)? Independent  Functional Cognition: Did the patient need help planning regular tasks such as shopping or remembering to take medications? Independent  Current Functional  Level Cognition  Overall Cognitive Status: Impaired/Different from baseline Current Attention Level:  Alternating Orientation Level: Oriented X4 Following Commands: Follows one step commands with increased time Safety/Judgement: Decreased awareness of safety, Decreased awareness of deficits General Comments: Requires repetition of cues and manual cues at times to follow commands.    Extremity Assessment (includes Sensation/Coordination)  Upper Extremity Assessment: Generalized weakness (impaired coordination )  Lower Extremity Assessment: Defer to PT evaluation    ADLs  Overall ADL's : Needs assistance/impaired Eating/Feeding: Minimal assistance, Sitting Eating/Feeding Details (indicate cue type and reason): assist to set up tray, cut food, open containers Grooming: Min guard, Standing, Oral care Grooming Details (indicate cue type and reason): pt able to sequence task, open containers, problem solved using teeth to open toothbrush packaging Upper Body Bathing: Maximal assistance, Sitting Lower Body Bathing: Maximal assistance, Sit to/from stand Upper Body Dressing : Supervision/safety, Sitting Upper Body Dressing Details (indicate cue type and reason): pt needing cues to orient gown as front opening, inefficient Lower Body Dressing: Min guard, Sit to/from stand Toilet Transfer: Ambulation, RW, Minimal assistance Toileting- Clothing Manipulation and Hygiene: Minimal assistance, Sit to/from stand Functional mobility during ADLs: Minimal assistance, Rolling walker    Mobility  Overal bed mobility: Needs Assistance Bed Mobility: Supine to Sit Supine to sit: Mod assist General bed mobility comments: pt in chair    Transfers  Overall transfer level: Needs assistance Equipment used: Rolling walker (2 wheeled) Transfers: Sit to/from Stand Sit to Stand: Min guard Stand pivot transfers: Mod assist General transfer comment: cues for technique, min guard for safety      Ambulation / Gait / Stairs / Wheelchair Mobility  Ambulation/Gait Ambulation/Gait assistance: Min assist Ambulation Distance (Feet): 75 Feet Assistive device: None Gait Pattern/deviations: Decreased stride length, Staggering left, Staggering right, Drifts right/left General Gait Details: Unsteady gait with pt reaching for rail and counter for support. Staggering to the left with differentiating step lengths. Left knee instability at times but no overt buckling. Gait velocity: decreased Gait velocity interpretation: <1.8 ft/sec, indicative of risk for recurrent falls    Posture / Balance Dynamic Sitting Balance Sitting balance - Comments: Fatigues easily in trunk musculature resulting in need for back support. Balance Overall balance assessment: Needs assistance Sitting-balance support: Feet supported, No upper extremity supported Sitting balance-Leahy Scale: Good Sitting balance - Comments: Fatigues easily in trunk musculature resulting in need for back support. Standing balance support: During functional activity, Single extremity supported Standing balance-Leahy Scale: Fair Standing balance comment: able to release walker at sink for grooming, but leans on sink for stability    Special needs/care consideration BiPAP/CPAP no CPM no Continuous Drip IV no Dialysis no  Life Vest no Oxygen no Special Bed no Trach Size no Wound Vac (area) no  Skin Ecchymosis abdomen  Bowel mgmt: Last BM 09/04/15, incontinent per nursing assessment Bladder mgmt: BSC with assist  Diabetic mgmt no     Previous Home Environment Living Arrangements: Spouse/significant other Available Help at Discharge: Family Type of Home: House Home Layout: Two level Alternate Level Stairs-Rails: Can reach both Alternate Level Stairs-Number of Steps: 12 Home Access: Level entry Home Care Services: No Additional Comments: pt unable to  recall bathroom set up, no family in room, pt is a Oceanographer and likes to bake  Discharge Living Setting Plans for Discharge Living Setting: Patient's home Type of Home at Discharge: Other (Comment) (townhome) Discharge Home Layout: Two level Alternate Level Stairs-Rails: Right, Left, Can reach both Alternate Level Stairs-Number of Steps: 12 Discharge Home Access: Level entry Does the patient have any problems obtaining your  medications?: No  Social/Family/Support Systems Anticipated Caregiver: husband Myishia Auten, [phone] cell Ability/Limitations of Caregiver: [name] is retired and available to assist as much as needed Caregiver Availability: 24/7 Discharge Plan Discussed with Primary Caregiver: Yes Is Caregiver In Agreement with Plan?: Yes Does Caregiver/Family have Issues with Lodging/Transportation while Pt is in Rehab?: No   Goals/Additional Needs Patient/Family Goal for Rehab: supervision and min PT/OT, mod I/ supervision SLP Expected length of stay: 10-13 days Cultural Considerations: Pt. is Jewish Dietary Needs: heart healthy, thin liquids Equipment Needs: TBA Pt/Family Agrees to Admission and willing to participate: Yes Program Orientation Provided & Reviewed with Pt/Caregiver Including Roles & Responsibilities: Yes   Decrease burden of Care through IP rehab admission: n/a   Possible need for SNF placement upon discharge: Not anticipated   Patient Condition: This patient's medical and functional status has changed since the consult dated: 09/03/15 in which the Rehabilitation Physician determined and documented that the patient's condition is appropriate for intensive rehabilitative care in an inpatient rehabilitation facility. See "History of Present Illness" (above) for medical update. Functional changes are: min assist 92' no device used; increased time needed for direction following and problem following with OT/ADLs at supervision and min assist.  Patient's medical and functional status update has been discussed with the Rehabilitation physician and patient remains appropriate for inpatient rehabilitation. Will admit to inpatient rehab today.  Preadmission Screen Completed By: Marie Ramos, 09/05/2015 5:04 PM ______________________________________________________________________  Discussed status with Dr. Posey Pronto on 09/05/15 at 1704 and received telephone approval for admission today.  Admission Coordinator: Marie Ramos, time [id number] /Date 09/05/15          Cosigned by: Ankit [name], MD at 09/05/2015 5:25 PM  Revision History     Date/Time User Provider Type Action   09/05/2015 5:25 PM Ankit [name], MD Physician Cosign   09/05/2015 5:04 PM Marie Ramos Rehab Admission Coordinator Sign

## 2015-09-05 NOTE — H&P (View-Only) (Signed)
Physical Medicine and Rehabilitation Admission H&P    Chief Complaint  Patient presents with  . Anoxic BI with embolic/watershed stroke     HPI: Marie Ramos is a 60 y.o. female with history of fall with TBI/splenectomy, bipolar disorder with recent increase in psychiatric medications who was admitted on 08/24/15 after witnessed cardiac arrest. Patient unresponsive and pulseless and AED placed by school nurse with 2 shocks and CPR initiated. EMS evaluation showed PEA and ROSC after epinephrine--down time 10 minutes. Initial rhythm presumed to be VT. Patient intubated and cardiac cath was negative for CAD. She was treated with hypothermia protocol and arrest felt to be due to QT elongation. 2D echo with asymmetric septal hypertrophic CM without obstruction. Cardiac MRI showed hypertensive rather than HCM with EF 45%. She was extubated without difficulty on 02/23 and Psych consulted for medication adjustment and seroquel and adderral discontinued. Trileptal added to help with mood stabilization and high levels of anxiety managed with prn Xanax. Neurology consulted due to confusion and MRI brain done revealing scattered supra and infratentorial foci of acute ischemia measuring up to 11 mm-- question embolic phenomena and possible component of watershed ischemia. MRA without significant stenosis or aneurysm. Dr. Erlinda Hong recommended ASA and statin for strokes due to SVD v/s embolic due to cardiac cath. Acute systolic dysfunction treated with coreg and ACE titration and ICD to be placed on 02/27. BLE dopplers done yesterday and revealed left posterior tibial and left peroneal veins DVT therefore patient started on Eliquis. Therapy initiated and patient noted to have impaired cognition with generalized weakness, SOB as well as decreased balance. CIR recommended for follow up therapy   Review of Systems  HENT: Negative for hearing loss.   Eyes: Negative for blurred vision and double vision.    Respiratory: Positive for cough (dry). Negative for sputum production and shortness of breath.   Cardiovascular: Positive for chest pain. Negative for claudication and leg swelling.  Gastrointestinal: Negative for heartburn, nausea and constipation.  Genitourinary: Negative for dysuria and urgency.  Musculoskeletal: Negative for myalgias and back pain.  Skin: Negative for rash.  Neurological: Positive for dizziness (with ambulation) and weakness. Negative for sensory change, focal weakness and headaches.  Psychiatric/Behavioral: Positive for memory loss. The patient is nervous/anxious.   All other systems reviewed and are negative.    Past Medical History  Diagnosis Date  . Bipolar 1 disorder ([place])   . Hypertrophic obstructive cardiomyopathy ([place]) 08/29/2015  . Cardiac arrest ([place]) 08/24/2015  . QT prolongation 08/29/2015  . TBI (traumatic brain injury) [facility]) 2011    Inpatient rehab [facility]  . Macular degeneration   . Chronically dry eyes   . Ventricular fibrillation ([place]) 09/03/15    MDT ICD Dr. Rayann Heman    Past Surgical History  Procedure Laterality Date  . Cardiac catheterization N/A 08/24/2015    Procedure: Left Heart Cath and Coronary Angiography;  Surgeon: Sherren Mocha, MD;  Location: [place] [facility];  Service: Cardiovascular;  Laterality: N/A;  . Abdominal hysterectomy    . Tonsillectomy    . Splenectomy, total  2011  . Ep implantable device N/A 09/03/2015    MDT dual chamber ICD    Family History  Problem Relation Age of Onset  . Cancer Mother   . COPD Father     Social History: Married. Husband retired but works prn. She was working full time as a Oceanographer. She reports that she has never smoked. She has never used smokeless tobacco. She reports  that she does not drink alcohol or use illicit drugs.    Allergies  Allergen Reactions  . Naproxen Itching  . Vicodin [Hydrocodone-Acetaminophen] Itching    Medications Prior to Admission  Medication Sig  Dispense Refill  . venlafaxine XR (EFFEXOR-XR) 75 MG 24 hr capsule Take 75 mg by mouth daily.  4  . [DISCONTINUED] amphetamine-dextroamphetamine (ADDERALL) 20 MG tablet Take 20 mg by mouth 3 (three) times daily.   0  . [DISCONTINUED] fexofenadine (ALLEGRA) 60 MG tablet Take 60 mg by mouth daily.    . [DISCONTINUED] QUEtiapine (SEROQUEL) 100 MG tablet Take 100 mg by mouth at bedtime.  4  . [DISCONTINUED] topiramate (TOPAMAX) 100 MG tablet Take 100 mg by mouth at bedtime.  3    Home: Home Living Family/patient expects to be discharged to:: Private residence Living Arrangements: Spouse/significant other Available Help at Discharge: Family Type of Home: House Home Access: Level entry [place]: Two level Alternate Level Stairs-Number of Steps: 12 Alternate Level Stairs-Rails: Can reach both Home Equipment: None Additional Comments: pt unable to recall bathroom set up, no family in room, pt is a Oceanographer and likes to bake   Functional History: Prior Function Level of Independence: Independent  Functional Status:  Mobility: Bed Mobility Overal bed mobility: Needs Assistance Bed Mobility: Supine to Sit Supine to sit: Mod assist General bed mobility comments: pt in chair Transfers Overall transfer level: Needs assistance Equipment used: Rolling walker (2 wheeled), None Transfers: Sit to/from Stand, Stand Pivot Transfers Sit to Stand: Min assist Stand pivot transfers: Mod assist General transfer comment: assist to rise and for UE support for balance Ambulation/Gait Ambulation/Gait assistance: Min assist Ambulation Distance (Feet): 50 Feet (+ 50') Assistive device: Rolling walker (2 wheeled) Gait Pattern/deviations: Step-through pattern, Decreased stride length, Staggering left, Staggering right, Drifts right/left, Trunk flexed General Gait Details: Impulsive, unsteady gait with staggering noted to left/right. + dizziness. Bumping into walls/obstacles in hallway. Easily  distracted. 1 seated rest break due to dizziness forced by therapist. Gait velocity interpretation: at or above normal speed for age/gender    ADL: ADL Overall ADL's : Needs assistance/impaired Eating/Feeding: Minimal assistance, Sitting Eating/Feeding Details (indicate cue type and reason): assist to set up tray, cut food, open containers Grooming: Wash/dry hands, Sitting, Minimal assistance Grooming Details (indicate cue type and reason): decreased task persistence Upper Body Bathing: Maximal assistance, Sitting Lower Body Bathing: Maximal assistance, Sit to/from stand Upper Body Dressing : Sitting, Moderate assistance Lower Body Dressing: Maximal assistance, Sit to/from stand Toilet Transfer: Moderate assistance, BSC, Stand-pivot Toileting- Clothing Manipulation and Hygiene: Sit to/from stand, Moderate assistance  Cognition: Cognition Overall Cognitive Status: Impaired/Different from baseline Orientation Level: Oriented X4 Cognition Arousal/Alertness: Awake/alert Behavior During Therapy: WFL for tasks assessed/performed Overall Cognitive Status: Impaired/Different from baseline Area of Impairment: Safety/judgement Orientation Level: Disoriented to, Situation, Time Current Attention Level: Focused Memory: Decreased short-term memory Following Commands: Follows one step commands with increased time Safety/Judgement: Decreased awareness of safety, Decreased awareness of deficits Awareness: Intellectual Problem Solving: Slow processing, Difficulty sequencing, Requires verbal cues, Requires tactile cues, Decreased initiation General Comments: Requires repetition of cues and manual cues at times to follow commands.    Blood pressure 133/54, pulse 77, temperature 98.7 F (37.1 C), temperature source Oral, resp. rate 18, height _0  (1.702 m), weight 100.245 kg (221 lb), SpO2 99 %. Physical Exam Constitutional: She is oriented to person, place, and time. She appears well-developed  and well-nourished.  HENT:  Head: Normocephalic and atraumatic.  Mouth/Throat: Oropharynx is  clear and moist.  Eyes: Conjunctivae and EOM are normal. Pupils are equal, round, and reactive to light.  Neck: Normal range of motion. Neck supple. No tracheal deviation present. No thyromegaly present.  Cardiovascular: Normal rate and regular rhythm.  No murmur heard. Respiratory: Effort normal and breath sounds normal.  GI: Soft. Bowel sounds are normal. She exhibits no distension. There is no tenderness.  Musculoskeletal: She exhibits no edema or tenderness.  Neurological: She is alert and oriented to person, place, and time.  Able to follow simple commands.  DTRs symmetric Sensation mildly diminished LUE/LLE Motor:  RUE: 4+/5 proximal to distal LUE: 4/5 proximal to distal LLE: Hip flexion 4/5, knee extension 4+/5, ankle dorsi/plantarflexion 5/5  RLE: Hip flexion 4+/5, knee extension 4+/5, ankle dorsi/plantarflexion 5/5  Skin: Skin is warm and dry. No rash noted.  Psychiatric: She has a normal mood and affect. Her behavior is normal.    Results for orders placed or performed during the hospital encounter of 08/24/15 (from the past 48 hour(s))  Glucose, capillary     Status: None   Collection Time: 09/02/15  4:09 PM  Result Value Ref Range   Glucose-Capillary 99 65 - 99 mg/dL   Comment 1 Notify RN    Comment 2 Document in Chart   Glucose, capillary     Status: Abnormal   Collection Time: 09/02/15  9:10 PM  Result Value Ref Range   Glucose-Capillary 114 (H) 65 - 99 mg/dL  CBC     Status: Abnormal   Collection Time: 09/03/15  2:48 AM  Result Value Ref Range   WBC 14.1 (H) 4.0 - 10.5 K/uL   RBC 4.17 3.87 - 5.11 MIL/uL   Hemoglobin 11.9 (L) 12.0 - 15.0 g/dL   HCT 38.7 36.0 - 46.0 %   MCV 92.8 78.0 - 100.0 fL   MCH 28.5 26.0 - 34.0 pg   MCHC 30.7 30.0 - 36.0 g/dL   RDW 15.5 11.5 - 15.5 %   Platelets 553 (H) 150 - 400 K/uL  Basic metabolic panel     Status: Abnormal    Collection Time: 09/03/15  2:48 AM  Result Value Ref Range   Sodium 138 135 - 145 mmol/L   Potassium 4.5 3.5 - 5.1 mmol/L   Chloride 103 101 - 111 mmol/L   CO2 27 22 - 32 mmol/L   Glucose, Bld 99 65 - 99 mg/dL   BUN 26 (H) 6 - 20 mg/dL   Creatinine, Ser 0.89 0.44 - 1.00 mg/dL   Calcium 9.1 8.9 - 10.3 mg/dL   GFR calc non Af Amer >60 >60 mL/min   GFR calc Af Amer >60 >60 mL/min    Comment: (NOTE) The eGFR has been calculated using the CKD EPI equation. This calculation has not been validated in all clinical situations. eGFR's persistently <60 mL/min signify possible Chronic Kidney Disease.    Anion gap 8 5 - 15  Hemoglobin A1c     Status: Abnormal   Collection Time: 09/03/15  2:48 AM  Result Value Ref Range   Hgb A1c MFr Bld 6.2 (H) 4.8 - 5.6 %    Comment: (NOTE)         Pre-diabetes: 5.7 - 6.4         Diabetes: >6.4         Glycemic control for adults with diabetes: <7.0    Mean Plasma Glucose 131 mg/dL    Comment: (NOTE) Performed At: [facility] [address] [place], [place] [id number]  Lindon Romp MD HL:[id number]   Protime-INR     Status: None   Collection Time: 09/03/15  2:48 AM  Result Value Ref Range   Prothrombin Time 14.3 11.6 - 15.2 seconds   INR 1.09 0.00 - 1.49  Lipid panel     Status: Abnormal   Collection Time: 09/03/15  2:48 AM  Result Value Ref Range   Cholesterol 225 (H) 0 - 200 mg/dL   Triglycerides 333 (H) <150 mg/dL   HDL 43 >40 mg/dL   Total CHOL/HDL Ratio 5.2 RATIO   VLDL 67 (H) 0 - 40 mg/dL   LDL Cholesterol 115 (H) 0 - 99 mg/dL    Comment:        Total Cholesterol/HDL:CHD Risk Coronary Heart Disease Risk Table                     Men   Women  1/2 Average Risk   3.4   3.3  Average Risk       5.0   4.4  2 X Average Risk   9.6   7.1  3 X Average Risk  23.4   11.0        Use the calculated Patient Ratio above and the CHD Risk Table to determine the patient's CHD Risk.        ATP III CLASSIFICATION (LDL):  <100     mg/dL    Optimal  100-129  mg/dL   Near or Above                    Optimal  130-159  mg/dL   Borderline  160-189  mg/dL   High  >190     mg/dL   Very High   Glucose, capillary     Status: None   Collection Time: 09/03/15  6:12 AM  Result Value Ref Range   Glucose-Capillary 69 65 - 99 mg/dL  Glucose, capillary     Status: Abnormal   Collection Time: 09/03/15  6:48 AM  Result Value Ref Range   Glucose-Capillary 112 (H) 65 - 99 mg/dL  Glucose, capillary     Status: Abnormal   Collection Time: 09/03/15  4:48 PM  Result Value Ref Range   Glucose-Capillary 60 (L) 65 - 99 mg/dL   Comment 1 Notify RN    Comment 2 Document in Chart   Glucose, capillary     Status: Abnormal   Collection Time: 09/03/15  6:38 PM  Result Value Ref Range   Glucose-Capillary 123 (H) 65 - 99 mg/dL   Comment 1 Notify RN    Comment 2 Document in Chart   Glucose, capillary     Status: Abnormal   Collection Time: 09/03/15  8:59 PM  Result Value Ref Range   Glucose-Capillary 151 (H) 65 - 99 mg/dL   Comment 1 Notify RN    Comment 2 Document in Chart   Glucose, capillary     Status: Abnormal   Collection Time: 09/04/15 12:54 AM  Result Value Ref Range   Glucose-Capillary 110 (H) 65 - 99 mg/dL  CBC     Status: Abnormal   Collection Time: 09/04/15  4:55 AM  Result Value Ref Range   WBC 17.2 (H) 4.0 - 10.5 K/uL   RBC 4.21 3.87 - 5.11 MIL/uL   Hemoglobin 12.1 12.0 - 15.0 g/dL   HCT 38.8 36.0 - 46.0 %   MCV 92.2 78.0 - 100.0 fL   MCH 28.7 26.0 - 34.0 pg  MCHC 31.2 30.0 - 36.0 g/dL   RDW 15.5 11.5 - 15.5 %   Platelets 539 (H) 150 - 400 K/uL  Basic metabolic panel     Status: Abnormal   Collection Time: 09/04/15  4:55 AM  Result Value Ref Range   Sodium 139 135 - 145 mmol/L   Potassium 5.2 (H) 3.5 - 5.1 mmol/L   Chloride 103 101 - 111 mmol/L   CO2 26 22 - 32 mmol/L   Glucose, Bld 97 65 - 99 mg/dL   BUN 30 (H) 6 - 20 mg/dL   Creatinine, Ser 0.95 0.44 - 1.00 mg/dL   Calcium 9.1 8.9 - 10.3 mg/dL   GFR calc non  Af Amer >60 >60 mL/min   GFR calc Af Amer >60 >60 mL/min    Comment: (NOTE) The eGFR has been calculated using the CKD EPI equation. This calculation has not been validated in all clinical situations. eGFR's persistently <60 mL/min signify possible Chronic Kidney Disease.    Anion gap 10 5 - 15  Glucose, capillary     Status: Abnormal   Collection Time: 09/04/15  5:04 AM  Result Value Ref Range   Glucose-Capillary 113 (H) 65 - 99 mg/dL  Glucose, capillary     Status: Abnormal   Collection Time: 09/04/15  5:47 AM  Result Value Ref Range   Glucose-Capillary 148 (H) 65 - 99 mg/dL  Basic metabolic panel     Status: Abnormal   Collection Time: 09/04/15 10:25 AM  Result Value Ref Range   Sodium 137 135 - 145 mmol/L   Potassium 4.1 3.5 - 5.1 mmol/L   Chloride 102 101 - 111 mmol/L   CO2 27 22 - 32 mmol/L   Glucose, Bld 137 (H) 65 - 99 mg/dL   BUN 26 (H) 6 - 20 mg/dL   Creatinine, Ser 0.99 0.44 - 1.00 mg/dL   Calcium 8.9 8.9 - 10.3 mg/dL   GFR calc non Af Amer >60 >60 mL/min   GFR calc Af Amer >60 >60 mL/min    Comment: (NOTE) The eGFR has been calculated using the CKD EPI equation. This calculation has not been validated in all clinical situations. eGFR's persistently <60 mL/min signify possible Chronic Kidney Disease.    Anion gap 8 5 - 15  CBC     Status: Abnormal   Collection Time: 09/04/15 10:25 AM  Result Value Ref Range   WBC 16.5 (H) 4.0 - 10.5 K/uL   RBC 4.01 3.87 - 5.11 MIL/uL   Hemoglobin 11.9 (L) 12.0 - 15.0 g/dL   HCT 36.9 36.0 - 46.0 %   MCV 92.0 78.0 - 100.0 fL   MCH 29.7 26.0 - 34.0 pg   MCHC 32.2 30.0 - 36.0 g/dL   RDW 15.2 11.5 - 15.5 %   Platelets 514 (H) 150 - 400 K/uL   Dg Chest 2 View  09/04/2015  CLINICAL DATA:  Pacemaker inserted yesterday EXAM: CHEST  2 VIEW COMPARISON:  09/01/2015 FINDINGS: Cardiomediastinal silhouette is stable. There is dual lead cardiac pacemaker with left subclavian approach with leads in right atrium and right ventricle. There  is no pneumothorax. No infiltrate or pulmonary edema. Old left rib fractures are again noted. IMPRESSION: No active cardiopulmonary disease. Dual lead cardiac pacemaker in place with leads in right atrium and right ventricle. No pneumothorax. Electronically Signed   By: Lahoma Crocker M.D.   On: 09/04/2015 08:14   Mr [name] Head Wo Contrast  09/02/2015  CLINICAL DATA:  CVA.  Cardiac  arrest.  Abnormal MRI scan. EXAM: MRA HEAD WITHOUT CONTRAST TECHNIQUE: Angiographic images of the Circle of Willis were obtained using MRA technique without intravenous contrast. COMPARISON:  MRI brain 09/01/2015 FINDINGS: The internal carotid arteries are within normal limits from the high cervical segments through the ICA termini bilaterally. The A1 and M1 segments are within normal limits. There is mild irregularity within the proximal a 2 segments. There is an early bifurcation of the proximal right anterior cerebral artery, a normal variant. No definite anterior communicating artery is present. The MCA bifurcations are intact. The visualized ACA and MCA branch vessels are within normal limits. The vertebral arteries are codominant. The PICA origins are not visualized. The PICA origins are visualized and normal. Both posterior cerebral arteries originate from the basilar tip. There is mild attenuation of proximal PCA vessels bilaterally. The distal PCA branch vessels are intact. IMPRESSION: 1. Mild atherosclerotic irregularity without a significant proximal stenosis, aneurysm, or branch vessel occlusion to account for the patient's acute infarcts. Electronically Signed   By: San Morelle M.D.   On: 09/02/2015 18:12    Medical Problem List and Plan: 1.  Weakness, abnormality of gait, and cognitive dysfunction secondary to Cardiac arrest with hypoxic encephalopathy and embolic/watershed stroke. 2.  LLE DVT/Anticoagulation: Pharmaceutical: Other (comment)-Eliquis 3. Pain Management:  Tylenol prn effective for back/chest  soreness.  4. Mood: Team to provide ego support. LCSW to follow for evaluation and support.  5. Neuropsych: This patient is not fully capable of making decisions on her own behalf. 6. Skin/Wound Care: Monitor wound daily. Routine pressure relief measures.  7. Fluids/Electrolytes/Nutrition: Monitor I/O. Check lytes in am.  8. VT/PEA arrest: NO DRIVING FOR 6 MONTHS POST ARREST.  ICD in place.  9. Acute systolic dysfunction: Likely due to hypertensive CM--on Coreg and Ace.  10. HTN: Monitor BP bid. Continue coreg and Lisinopril.  11. Supra and infratentorial infarcts could be embolic due to cardiac cath v/s small vessel disease:  12. Bipolar disease: Mood stable on Effexor XR with xanax prn.  13. Leucocytosis: Likely due to E coli UTI. Started on ceftriaxone yesterday and changed to ceftin  D # 2---sensitivities from 2/25 still pending.  14.  Acute renal insufficiency: push po fluids.    Post Admission Physician Evaluation: 1. Functional deficits secondary  to Cardiac arrest with hypoxic encephalopathy and embolic/watershed stroke. 2. Patient is admitted to receive collaborative, interdisciplinary care between the physiatrist, rehab nursing staff, and therapy team. 3. Patient's level of medical complexity and substantial therapy needs in context of that medical necessity cannot be provided at a lesser intensity of care such as a SNF. 4. Patient has experienced substantial functional loss from his/her baseline which was documented above under the "Functional History" and "Functional Status" headings.  Judging by the patient's diagnosis, physical exam, and functional history, the patient has potential for functional progress which will result in measurable gains while on inpatient rehab.  These gains will be of substantial and practical use upon discharge  in facilitating mobility and self-care at the household level. 5. Physiatrist will provide 24 hour management of medical needs as well as oversight  of the therapy plan/treatment and provide guidance as appropriate regarding the interaction of the two. 6. 24 hour rehab nursing will assist with safety, skin/wound care, disease management, medication administration and patient education and help integrate therapy concepts, techniques,education, etc. 7. PT will assess and treat for/with: Lower extremity strength, range of motion, stamina, balance, functional mobility, safety, adaptive techniques and equipment, coping  skills, pain control, stroke education.  Goals are: Supervision/Mod I. 8. OT will assess and treat for/with: ADL's, functional mobility, safety, upper extremity strength, adaptive techniques and equipment, ego support, and community reintegration.   Goals are: Supervision/Mod I. Therapy may proceed with showering this patient. 9. SLP will assess and treat for/with: higher level cognition, memory dysfunction.  Goals are: Mod I/Supervision. 10. Case Management and Social Worker will assess and treat for psychological issues and discharge planning. 11. Team conference will be held weekly to assess progress toward goals and to determine barriers to discharge. 12. Patient will receive at least 3 hours of therapy per day at least 5 days per week. 13. ELOS: 10-14 days.       14. Prognosis:  excellent   Delice Lesch, MD 09/04/2015

## 2015-09-05 NOTE — PMR Pre-admission (Signed)
PMR Admission Coordinator Pre-Admission Assessment  Patient: Marie Ramos is an 60 y.o., female MRN: [id number] DOB: 1956/01/24 Height: 5\' 7"  (170.2 cm) Weight: 100.245 kg (221 lb)              Insurance Information HMO:     PPO: yes     PCP:      IPA:      80/20:      OTHER:  PRIMARY: BC[employer] Advantage Silver      Policy#:   [id number]    Subscriber: self CM Name:   Merrilyn Puma     Phone#:  [phone]     Fax#:  [phone] Pre-Cert#:  [id number], approved for 09/05/15-09/11/15 with expected DC of 3/817 with clinical updates due 09/11/15      Employer:  Benefits:  Phone #:  [phone]     Name:  Abby Eff. Date:  07/08/15     Deduct:  $3000      Out of Pocket Max:  $5700      Life Max:  n/a CIR:  70%/30%      SNF:  70%/30% Outpatient:  $40 copay      Home Health:   70%      Co-Pay:  30% DME:  70%     Co-Pay:  30% Providers:  In network SECONDARY:       Policy#:       Subscriber:  CM Name:       Phone#:      Fax#:  Pre-Cert#:       Employer:  Benefits:  Phone #:      Name:  Eff. Date:      Deduct:       Out of Pocket Max:       Life Max:  CIR:       SNF:  Outpatient:      Co-Pay:  Home Health:       Co-Pay:  DME:      Co-Pay:   Medicaid Application Date:       Case Manager:  Disability Application Date:       Case Worker:   Emergency Contact Information Contact Information    Name Relation Home Work Mobile   [name]  [phone]     [facility] Daughter   [phone]   [name], [name]   [phone]     Current Medical History  Patient Admitting Diagnosis: Cardiac arrest with hypoxic encephalopathy and embolic/watershed stroke History of Present Illness: Marie Ramos is a 60 y.o. female with history of fall with TBI/splenectomy, bipolar disorder with recent increase in psychiatric medications who was admitted on 08/24/15 after witnessed cardiac arrest. Patient unresponsive and pulseless and AED placed by school nurse with 2 shocks and CPR  initiated. EMS evaluation showed PEA and ROSC after epinephrine--down time 10 minutes. Initial rhythm presumed to be VT. Patient intubated and cardiac cath was negative for CAD. She was treated with hypothermia protocol and arrest felt to be due to QT elongation. 2D echo with asymmetric septal hypertrophic CM without obstruction. Cardiac MRI showed hypertensive rather than HCM with EF 45%. She was extubated without difficulty on 02/23 and Psych consulted for medication adjustment and seroquel and adderral discontinued. Trileptal added to help with mood stabilization and high levels of anxiety managed with prn Xanax. Neurology consulted due to confusion and MRI brain done revealing scattered supra and infratentorial foci of acute ischemia measuring up to 11 mm-- question embolic phenomena and possible component of watershed ischemia. MRA  without significant stenosis or aneurysm. Dr. Erlinda Hong recommended ASA and statin for strokes due to SVD v/s embolic due to cardiac cath. Acute systolic dysfunction treated with coreg and ACE titration and ICD to be placed on 02/27. BLE dopplers done yesterday and revealed left posterior tibial and left peroneal veins DVT therefore patient started on Eliquis. Therapy initiated and patient noted to have impaired cognition with generalized weakness, SOB as well as decreased balance. CIR recommended for follow up therapy      Past Medical History  Past Medical History  Diagnosis Date  . Bipolar 1 disorder ([place])   . Hypertrophic obstructive cardiomyopathy ([place]) 08/29/2015  . Cardiac arrest ([place]) 08/24/2015  . QT prolongation 08/29/2015  . TBI (traumatic brain injury) [facility]) 2011    Inpatient rehab [facility]  . Macular degeneration   . Chronically dry eyes   . Ventricular fibrillation ([place]) 09/03/15    MDT ICD Dr. Rayann Heman    Family History  family history includes COPD in her father; Cancer in her mother.  Prior Rehab/Hospitalizations:  Has the patient had major surgery during 100  days prior to admission? No  Current Medications   Current facility-administered medications:  .  0.9 %  sodium chloride infusion, 250 mL, Intravenous, PRN, Thompson Grayer, MD .  acetaminophen (TYLENOL) tablet 325-650 mg, 325-650 mg, Oral, Q4H PRN, Thompson Grayer, MD, 650 mg at 09/05/15 0758 .  ALPRAZolam (XANAX) tablet 0.25 mg, 0.25 mg, Oral, QHS PRN, Brand Males, MD, 0.25 mg at 08/30/15 2319 .  amLODipine (NORVASC) tablet 5 mg, 5 mg, Oral, Daily, Irine Seal V, MD, 5 mg at 09/05/15 0945 .  apixaban (ELIQUIS) tablet 10 mg, 10 mg, Oral, BID, Otilio Miu, RPH, 10 mg at 09/05/15 0944 .  [START ON 09/11/2015] apixaban (ELIQUIS) tablet 5 mg, 5 mg, Oral, BID, Otilio Miu, RPH .  atorvastatin (LIPITOR) tablet 20 mg, 20 mg, Oral, q1800, Donzetta Starch, NP, 20 mg at 09/04/15 1713 .  benzonatate (TESSALON) capsule 100 mg, 100 mg, Oral, TID PRN, Florencia Reasons, MD, 100 mg at 09/05/15 0758 .  carvedilol (COREG) tablet 6.25 mg, 6.25 mg, Oral, BID WC, Thompson Grayer, MD, 6.25 mg at 09/05/15 0757 .  cefUROXime (CEFTIN) tablet 500 mg, 500 mg, Oral, BID WC, Eugenie Filler, MD .  dextromethorphan-guaiFENesin [facility] DM) 30-600 MG per 12 hr tablet 1 tablet, 1 tablet, Oral, BID, Florencia Reasons, MD, 1 tablet at 09/05/15 0945 .  famotidine (PEPCID) tablet 20 mg, 20 mg, Oral, Daily, Florencia Reasons, MD, 20 mg at 09/05/15 0945 .  insulin aspart (novoLOG) injection 2-6 Units, 2-6 Units, Subcutaneous, 6 times per day, Holley Raring, NP, 2 Units at 09/05/15 0012 .  ondansetron (ZOFRAN) injection 4 mg, 4 mg, Intravenous, Q6H PRN, Thompson Grayer, MD .  Oxcarbazepine (TRILEPTAL) tablet 300 mg, 300 mg, Oral, BID, Ambrose Finland, MD, 300 mg at 09/05/15 0944 .  polyvinyl alcohol (LIQUIFILM TEARS) 1.4 % ophthalmic solution 2 drop, 2 drop, Both Eyes, PRN, Florencia Reasons, MD, 2 drop at 09/04/15 1447 .  sodium chloride flush (NS) 0.9 % injection 3 mL, 3 mL, Intravenous, Q12H, Thompson Grayer, MD, 3 mL at 09/04/15 2215 .  sodium  chloride flush (NS) 0.9 % injection 3 mL, 3 mL, Intravenous, PRN, Thompson Grayer, MD .  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 None of the above  Prior Functional Level Prior Function Level of Independence: Independent  Self Care: Did the patient need help bathing, dressing, using the toilet or eating?  Independent  Indoor Mobility: Did the patient need assistance with walking from room to room (with or without device)? Independent  Stairs: Did the patient need assistance with internal or external stairs (with or without device)? Independent  Functional Cognition: Did the patient need help planning regular tasks such as shopping or remembering to take medications? Independent  Current Functional Level Cognition  Overall Cognitive Status: Impaired/Different from baseline Current Attention Level: Alternating Orientation Level: Oriented X4 Following Commands: Follows one step commands with increased time Safety/Judgement: Decreased awareness of safety, Decreased awareness of deficits General Comments: Requires repetition of cues and manual cues at times to follow commands.     Extremity Assessment (includes Sensation/Coordination)  Upper Extremity Assessment:  Generalized weakness (impaired coordination )  Lower Extremity Assessment: Defer to PT evaluation    ADLs  Overall ADL's : Needs assistance/impaired Eating/Feeding: Minimal assistance, Sitting Eating/Feeding Details (indicate cue type and reason): assist to set up tray, cut food, open containers Grooming: Min guard, Standing, Oral care Grooming Details (indicate cue type and reason): pt able to sequence task, open containers, problem solved using teeth to open toothbrush packaging Upper Body Bathing: Maximal assistance, Sitting Lower Body Bathing: Maximal assistance, Sit to/from stand Upper Body Dressing : Supervision/safety, Sitting Upper Body Dressing Details (indicate cue type and reason): pt needing cues to orient gown as front opening, inefficient Lower Body Dressing: Min guard, Sit to/from stand Toilet Transfer: Ambulation, RW, Minimal assistance Toileting- Clothing Manipulation and Hygiene: Minimal assistance, Sit to/from stand Functional mobility during ADLs: Minimal assistance, Rolling walker    Mobility  Overal bed mobility: Needs Assistance Bed Mobility: Supine to Sit Supine to sit: Mod assist General bed mobility comments: pt in chair    Transfers  Overall transfer level: Needs assistance Equipment used: Rolling walker (2 wheeled) Transfers: Sit to/from Stand Sit to Stand: Min guard Stand pivot transfers: Mod assist General transfer comment: cues for technique, min guard for safety    Ambulation / Gait / Stairs / Wheelchair Mobility  Ambulation/Gait Ambulation/Gait assistance: Min assist Ambulation Distance (Feet): 75 Feet Assistive device: None Gait Pattern/deviations: Decreased stride length, Staggering left, Staggering right, Drifts right/left General Gait Details: Unsteady gait with pt reaching for rail and counter for support. Staggering to the left with differentiating step lengths. Left knee instability at times but no overt buckling. Gait velocity:  decreased Gait velocity interpretation: <1.8 ft/sec, indicative of risk for recurrent falls    Posture / Balance Dynamic Sitting Balance Sitting balance - Comments: Fatigues easily in trunk musculature resulting in need for back support. Balance Overall balance assessment: Needs assistance Sitting-balance support: Feet supported, No upper extremity supported Sitting balance-Leahy Scale: Good Sitting balance - Comments: Fatigues easily in trunk musculature resulting in need for back support. Standing balance support: During functional activity, Single extremity supported Standing balance-Leahy Scale: Fair Standing balance comment: able to release  walker at sink for grooming, but leans on sink for stability    Special needs/care consideration BiPAP/CPAP   no CPM  no Continuous Drip IV   no Dialysis   no        Life Vest   no Oxygen   no Special Bed   no Trach Size   no Wound Vac (area)   no       Skin  Ecchymosis abdomen                                Bowel mgmt:   Last BM 09/04/15, incontinent per nursing assessment Bladder mgmt: BSC with assist  Diabetic mgmt   no     Previous Home Environment Living Arrangements: Spouse/significant other Available Help at Discharge: Family Type of Home: House Home Layout: Two level Alternate Level Stairs-Rails: Can reach both Alternate Level Stairs-Number of Steps: 12 Home Access: Level entry Home Care Services: No Additional Comments: pt unable to recall bathroom set up, no family in room, pt is a Oceanographer and likes to bake  Discharge Living Setting Plans for Discharge Living Setting: Patient's home Type of Home at Discharge: Other (Comment) (townhome) Discharge Home Layout: Two level Alternate Level Stairs-Rails: Right, Left, Can reach both Alternate Level Stairs-Number of Steps: 12 Discharge Home Access: Level entry Does the patient have any problems obtaining your medications?: No  Social/Family/Support  Systems Anticipated Caregiver: husband Mirissa [place], [phone] cell Ability/Limitations of Caregiver: [name] is retired and available to assist as much as needed Caregiver Availability: 24/7 Discharge Plan Discussed with Primary Caregiver: Yes Is Caregiver In Agreement with Plan?: Yes Does Caregiver/Family have Issues with Lodging/Transportation while Pt is in Rehab?: No   Goals/Additional Needs Patient/Family Goal for Rehab: supervision and min PT/OT, mod I/ supervision SLP Expected length of stay: 10-13 days Cultural Considerations: Pt. is Jewish Dietary Needs: heart healthy, thin liquids Equipment Needs: TBA Pt/Family Agrees to Admission and willing to participate: Yes Program Orientation Provided & Reviewed with Pt/Caregiver Including Roles  & Responsibilities: Yes   Decrease burden of Care through IP rehab admission: n/a   Possible need for SNF placement upon discharge:   Not anticipated   Patient Condition: This patient's medical and functional status has changed since the consult dated: 09/03/15  in which the Rehabilitation Physician determined and documented that the patient's condition is appropriate for intensive rehabilitative care in an inpatient rehabilitation facility. See "History of Present Illness" (above) for medical update. Functional changes are: min assist 59' no device used; increased time needed for direction following and problem following with OT/ADLs at supervision and min assist. Patient's medical and functional status update has been discussed with the Rehabilitation physician and patient remains appropriate for inpatient rehabilitation. Will admit to inpatient rehab today.  Preadmission Screen Completed By:  Gerlean Ren, 09/05/2015 5:04 PM ______________________________________________________________________   Discussed status with Dr. Posey Pronto on 09/05/15 at  1704  and received telephone approval for admission today.  Admission Coordinator:   Gerlean Ren, time [id number] /Date 09/05/15

## 2015-09-05 NOTE — Progress Notes (Signed)
Inpatient Rehabilitation  I have received insurance approval for IP Rehab.  I have spoken with Dr. Grandville Silos.  He will see patient then decide if she is ready for CIR today.  I will plan to admit later today if he thinks she is ready.  I have updated Marvetta Gibbons, RNCM.    [place] Admissions Coordinator Cell [phone] Office [phone]

## 2015-09-05 NOTE — Progress Notes (Signed)
Occupational Therapy Treatment Patient Details Name: Marie Ramos MRN: [id number] DOB: 1955-09-28 Today's Date: 09/05/2015    History of present illness 60 year old with history of bipolar disorder. Admitted after a witnessed cardiac arrest, intubated initially, emergently taken for heart cath and placed on hypothermic protocol. Encepholapthy noted. MRI brain revealed several subcentimeter foci of acute infarction. + DVT LLE.   OT comments  Progressing steadily in all areas. Focus of session on standing grooming, toileting and dressing skills. Improvement in attention, continues to require extra time for direction following and problem solving. Tolerated activity well.   Follow Up Recommendations  CIR;Supervision/Assistance - 24 hour    Equipment Recommendations  3 in 1 bedside comode    Recommendations for Other Services      Precautions / Restrictions Precautions Precautions: Fall Precaution Comments: pacemaker precautions       Mobility Bed Mobility               General bed mobility comments: pt in chair  Transfers Overall transfer level: Needs assistance Equipment used: Rolling walker (2 wheeled) Transfers: Sit to/from Stand Sit to Stand: Min guard         General transfer comment: cues for technique, min guard for safety    Balance     Sitting balance-Leahy Scale: Good       Standing balance-Leahy Scale: Fair Standing balance comment: able to release walker at sink for grooming, but leans on sink for stability                   ADL Overall ADL's : Needs assistance/impaired     Grooming: Min guard;Standing;Oral care Grooming Details (indicate cue type and reason): pt able to sequence task, open containers, problem solved using teeth to open toothbrush packaging         Upper Body Dressing : Supervision/safety;Sitting Upper Body Dressing Details (indicate cue type and reason): pt needing cues to orient gown as front opening,  inefficient Lower Body Dressing: Min guard;Sit to/from stand   Toilet Transfer: Ambulation;RW;Minimal assistance   Toileting- Clothing Manipulation and Hygiene: Minimal assistance;Sit to/from stand       Functional mobility during ADLs: Minimal assistance;Rolling walker        Vision                     Perception     Praxis      Cognition   Behavior During Therapy: WFL for tasks assessed/performed Overall Cognitive Status: Impaired/Different from baseline Area of Impairment: Safety/judgement;Memory;Following commands;Attention   Current Attention Level: Alternating Memory: Decreased short-term memory  Following Commands: Follows one step commands with increased time Safety/Judgement: Decreased awareness of safety;Decreased awareness of deficits          Extremity/Trunk Assessment               Exercises     Shoulder Instructions       General Comments      Pertinent Vitals/ Pain       Pain Assessment: Faces Faces Pain Scale: Hurts a little bit Pain Location: chest from compressions Pain Descriptors / Indicators: Sore Pain Intervention(s): Monitored during session  Home Living                                          Prior Functioning/Environment  Frequency Min 2X/week     Progress Toward Goals  OT Goals(current goals can now be found in the care plan section)  Progress towards OT goals: Progressing toward goals     Plan Discharge plan remains appropriate    Co-evaluation                 End of Session Equipment Utilized During Treatment: Gait belt   Activity Tolerance Patient tolerated treatment well   Patient Left in chair;with call bell/phone within reach;with family/visitor present   Nurse Communication          Time: 1050-1106 OT Time Calculation (min): 16 min  Charges: OT General Charges $OT Visit: 1 Procedure OT Treatments $Self Care/Home Management : 8-22  mins  Malka So 09/05/2015, 11:16 AM  [phone]

## 2015-09-05 NOTE — Progress Notes (Signed)
Ankit [name], MD Physician Signed Physical Medicine and Rehabilitation Consult Note 09/03/2015 8:26 AM  Related encounter: ED to Hosp-Admission (Current) from 08/24/2015 in [place] Collapse All        Physical Medicine and Rehabilitation Consult  Reason for Consult: Cardiac arrest with hypoxic encephalopathy and embolic/watershed stroke.  Referring Physician: Dr. Erlinda Hong.    HPI: Marie Ramos is a 60 y.o. female with history of fall with TBI/splenectomy, bipolar disorder with recent increase in psychiatric medications who was admitted on 08/24/15 after witnessed cardiac arrest. Patient unresponsive and pulseless and AED placed by school nurse with 2 shocks and CPR initiated. EMS evaluation showed PEA and ROSC after epinephrine--down time 10 minutes. Initial rhythm presumed to be VT. Patient intubated and cardiac cath was negative for CAD. She was treated with hypothermia protocol and arrest felt to be due to QT elongation. 2D echo with asymmetric septal hypertrophic CM without obstruction. Cardiac MRI showed hypertensive rather than HCM with EF 45%. She was extubated without difficulty on 02/23 and Psych consulted for medication adjustment and seroquel and adderral discontinued. Trileptal added to help with mood stabilization and high levels of anxiety managed with prn Xanax. Neurology consulted due to confusion and MRI brain done revealing scattered supra and infratentorial foci of acute ischemia measuring up to 11 mm-- question embolic phenomena and possible component of watershed ischemia. MRA without significant stenosis or aneurysm. Acute systolic dysfunction treated with coreg and ACE titration and ICD to be placed today. Therapy initiated and patient noted to have impaired cognition with generalized weakness, SOB as well as decreased balance. CIR recommended for follow up therapy.    Review of Systems  Constitutional: Negative  for diaphoresis.  HENT: Negative for hearing loss.  Eyes: Negative for blurred vision and double vision.  Respiratory: Positive for cough and shortness of breath.  Cardiovascular: Positive for chest pain and palpitations.  Gastrointestinal: Negative for heartburn, nausea, abdominal pain and constipation.  Genitourinary: Negative for dysuria and urgency.  Musculoskeletal: Positive for back pain. Negative for myalgias.  Neurological: Positive for weakness. Negative for dizziness, tingling, sensory change, speech change and headaches.  Psychiatric/Behavioral: The patient is nervous/anxious. The patient does not have insomnia.  All other systems reviewed and are negative.     Past Medical History  Diagnosis Date  . Bipolar 1 disorder ([place])   . Hypertrophic obstructive cardiomyopathy ([place]) 08/29/2015  . Cardiac arrest ([place]) 08/24/2015  . QT prolongation 08/29/2015  . TBI (traumatic brain injury) [facility]) 2011    Inpatient rehab [facility]  . Macular degeneration   . Chronically dry eyes     Past Surgical History  Procedure Laterality Date  . Cardiac catheterization N/A 08/24/2015    Procedure: Left Heart Cath and Coronary Angiography; Surgeon: Sherren Mocha, MD; Location: [place] [facility]; Service: Cardiovascular; Laterality: N/A;  . Abdominal hysterectomy    . Tonsillectomy    . Splenectomy, total  2011    Family History  Problem Relation Age of Onset  . Cancer Mother   . COPD Father       Social History: Married. Husband retired but works prn. She was working full time as a Oceanographer. She reports that she has never smoked. She has never used smokeless tobacco. She reports that she does not drink alcohol or use illicit drugs.    Allergies  Allergen Reactions  . Naproxen Itching  . Vicodin [Hydrocodone-Acetaminophen] Itching   Medications Prior to  Admission  Medication Sig Dispense  Refill  . amphetamine-dextroamphetamine (ADDERALL) 20 MG tablet Take 20 mg by mouth 3 (three) times daily.   0  . fexofenadine (ALLEGRA) 60 MG tablet Take 60 mg by mouth daily.    . QUEtiapine (SEROQUEL) 100 MG tablet Take 100 mg by mouth at bedtime.  4  . topiramate (TOPAMAX) 100 MG tablet Take 100 mg by mouth at bedtime.  3  . venlafaxine XR (EFFEXOR-XR) 75 MG 24 hr capsule Take 75 mg by mouth daily.  4    Home: Home Living Family/patient expects to be discharged to:: Private residence Living Arrangements: Spouse/significant other Available Help at Discharge: Family Type of Home: House Home Access: Level entry Home Layout: Two level Alternate Level Stairs-Number of Steps: 12 Alternate Level Stairs-Rails: Can reach both Home Equipment: None Additional Comments: pt unable to recall bathroom set up, no family in room, pt is a Oceanographer and likes to bake  Functional History: Prior Function Level of Independence: Independent Functional Status:  Mobility: Bed Mobility Overal bed mobility: Needs Assistance Bed Mobility: Supine to Sit Supine to sit: Mod assist General bed mobility comments: pt in chair Transfers Overall transfer level: Needs assistance Equipment used: Rolling walker (2 wheeled), None Transfers: Sit to/from Stand, Stand Pivot Transfers Sit to Stand: Min assist Stand pivot transfers: Mod assist General transfer comment: assist to rise and for UE support for balance Ambulation/Gait Ambulation/Gait assistance: Min assist Ambulation Distance (Feet): 50 Feet (+ 50') Assistive device: Rolling walker (2 wheeled) Gait Pattern/deviations: Step-through pattern, Decreased stride length, Staggering left, Staggering right, Drifts right/left, Trunk flexed General Gait Details: Impulsive, unsteady gait with staggering noted to left/right. + dizziness. Bumping into walls/obstacles in hallway. Easily distracted. 1 seated rest break due to  dizziness forced by therapist. Gait velocity interpretation: at or above normal speed for age/gender    ADL: ADL Overall ADL's : Needs assistance/impaired Eating/Feeding: Minimal assistance, Sitting Eating/Feeding Details (indicate cue type and reason): assist to set up tray, cut food, open containers Grooming: Wash/dry hands, Sitting, Minimal assistance Grooming Details (indicate cue type and reason): decreased task persistence Upper Body Bathing: Maximal assistance, Sitting Lower Body Bathing: Maximal assistance, Sit to/from stand Upper Body Dressing : Sitting, Moderate assistance Lower Body Dressing: Maximal assistance, Sit to/from stand Toilet Transfer: Moderate assistance, BSC, Stand-pivot Toileting- Clothing Manipulation and Hygiene: Sit to/from stand, Moderate assistance  Cognition: Cognition Overall Cognitive Status: Impaired/Different from baseline Orientation Level: Oriented to person, Oriented to place, Disoriented to time Cognition Arousal/Alertness: Awake/alert Behavior During Therapy: Impulsive Overall Cognitive Status: Impaired/Different from baseline Area of Impairment: Orientation, Memory, Following commands, Safety/judgement, Attention Orientation Level: Disoriented to, Situation, Time Current Attention Level: Focused Memory: Decreased short-term memory Following Commands: Follows one step commands with increased time Safety/Judgement: Decreased awareness of safety, Decreased awareness of deficits Awareness: Intellectual Problem Solving: Slow processing, Difficulty sequencing, Requires verbal cues, Requires tactile cues, Decreased initiation General Comments: Requires repetition of cues and manual cues at times to follow commands.    Blood pressure 176/87, pulse 70, temperature 97.7 F (36.5 C), temperature source Oral, resp. rate 18, height 5\' 7"  (1.702 m), weight 100.381 kg (221 lb 4.8 oz), SpO2 100 %. Physical Exam  Nursing note and vitals  reviewed. Constitutional: She is oriented to person, place, and time. She appears well-developed and well-nourished.  Morbidly obese female lying in bed with weak intermittent cough.  HENT:  Head: Normocephalic and atraumatic.  Mouth/Throat: Oropharynx is clear and moist.  Eyes: Conjunctivae and EOM are normal.  Pupils are equal, round, and reactive to light.  Neck: Normal range of motion. Neck supple. No tracheal deviation present. No thyromegaly present.  Cardiovascular: Normal rate and regular rhythm.  No murmur heard. Respiratory: Effort normal and breath sounds normal. She exhibits tenderness.  GI: Soft. Bowel sounds are normal. She exhibits no distension. There is no tenderness.  Musculoskeletal: She exhibits no edema or tenderness.  Neurological: She is alert and oriented to person, place, and time.  Able to follow simple commands.  Has poor initiation and lacks insight/ awareness of deficits.  DTRs symmetric Sensation intact to light touch Motor: B/L UE: 4+/5 proximal to distal B/LLE: Hip flexion 4/5, knee extension 4+/5, ankle dorsi/plantarflexion 4+/5  Skin: Skin is warm and dry. No rash noted.  Psychiatric: She has a normal mood and affect. Her behavior is normal.     Lab Results Last 24 Hours    Results for orders placed or performed during the hospital encounter of 08/24/15 (from the past 24 hour(s))  Glucose, capillary Status: Abnormal   Collection Time: 09/02/15 11:17 AM  Result Value Ref Range   Glucose-Capillary 123 (H) 65 - 99 mg/dL   Comment 1 Notify RN    Comment 2 Document in Chart   Glucose, capillary Status: None   Collection Time: 09/02/15 4:09 PM  Result Value Ref Range   Glucose-Capillary 99 65 - 99 mg/dL   Comment 1 Notify RN    Comment 2 Document in Chart   Glucose, capillary Status: Abnormal   Collection Time: 09/02/15 9:10 PM  Result Value Ref Range   Glucose-Capillary 114 (H) 65 -  99 mg/dL  CBC Status: Abnormal   Collection Time: 09/03/15 2:48 AM  Result Value Ref Range   WBC 14.1 (H) 4.0 - 10.5 K/uL   RBC 4.17 3.87 - 5.11 MIL/uL   Hemoglobin 11.9 (L) 12.0 - 15.0 g/dL   HCT 38.7 36.0 - 46.0 %   MCV 92.8 78.0 - 100.0 fL   MCH 28.5 26.0 - 34.0 pg   MCHC 30.7 30.0 - 36.0 g/dL   RDW 15.5 11.5 - 15.5 %   Platelets 553 (H) 150 - 400 K/uL  Basic metabolic panel Status: Abnormal   Collection Time: 09/03/15 2:48 AM  Result Value Ref Range   Sodium 138 135 - 145 mmol/L   Potassium 4.5 3.5 - 5.1 mmol/L   Chloride 103 101 - 111 mmol/L   CO2 27 22 - 32 mmol/L   Glucose, Bld 99 65 - 99 mg/dL   BUN 26 (H) 6 - 20 mg/dL   Creatinine, Ser 0.89 0.44 - 1.00 mg/dL   Calcium 9.1 8.9 - 10.3 mg/dL   GFR calc non Af Amer >60 >60 mL/min   GFR calc Af Amer >60 >60 mL/min   Anion gap 8 5 - 15  Protime-INR Status: None   Collection Time: 09/03/15 2:48 AM  Result Value Ref Range   Prothrombin Time 14.3 11.6 - 15.2 seconds   INR 1.09 0.00 - 1.49  Lipid panel Status: Abnormal   Collection Time: 09/03/15 2:48 AM  Result Value Ref Range   Cholesterol 225 (H) 0 - 200 mg/dL   Triglycerides 333 (H) <150 mg/dL   HDL 43 >40 mg/dL   Total CHOL/HDL Ratio 5.2 RATIO   VLDL 67 (H) 0 - 40 mg/dL   LDL Cholesterol 115 (H) 0 - 99 mg/dL  Glucose, capillary Status: None   Collection Time: 09/03/15 6:12 AM  Result Value Ref Range   Glucose-Capillary  69 65 - 99 mg/dL  Glucose, capillary Status: Abnormal   Collection Time: 09/03/15 6:48 AM  Result Value Ref Range   Glucose-Capillary 112 (H) 65 - 99 mg/dL      Imaging Results (Last 48 hours)    Dg Chest 2 View  09/01/2015 CLINICAL DATA: Patient recently extubated, now with recurrent cough and shortness of breath. EXAM: CHEST 2 VIEW COMPARISON: 08/28/2015  and earlier. FINDINGS: Interval extubation and nasogastric tube removal. Cardiac silhouette markedly enlarged, unchanged. Interval resolution of pulmonary venous hypertension and pulmonary edema. Pulmonary vascularity now normal. Improved aeration in the left lower lobe, with only minimal patchy opacities persisting. Lungs otherwise clear. IMPRESSION: 1. Interval marked improvement in aeration in the left lower lobe since the examination 4 days ago, with only mild residual atelectasis and/or pneumonia. No acute cardiopulmonary disease otherwise. 2. Interval resolution of pulmonary venous hypertension and pulmonary edema. Electronically Signed By: Evangeline Dakin M.D. On: 09/01/2015 15:55   Mr [name] Head Wo Contrast  09/02/2015 CLINICAL DATA: CVA. Cardiac arrest. Abnormal MRI scan. EXAM: MRA HEAD WITHOUT CONTRAST TECHNIQUE: Angiographic images of the Circle of Willis were obtained using MRA technique without intravenous contrast. COMPARISON: MRI brain 09/01/2015 FINDINGS: The internal carotid arteries are within normal limits from the high cervical segments through the ICA termini bilaterally. The A1 and M1 segments are within normal limits. There is mild irregularity within the proximal a 2 segments. There is an early bifurcation of the proximal right anterior cerebral artery, a normal variant. No definite anterior communicating artery is present. The MCA bifurcations are intact. The visualized ACA and MCA branch vessels are within normal limits. The vertebral arteries are codominant. The PICA origins are not visualized. The PICA origins are visualized and normal. Both posterior cerebral arteries originate from the basilar tip. There is mild attenuation of proximal PCA vessels bilaterally. The distal PCA branch vessels are intact. IMPRESSION: 1. Mild atherosclerotic irregularity without a significant proximal stenosis, aneurysm, or branch vessel occlusion to account for the patient's acute infarcts.  Electronically Signed By: San Morelle M.D. On: 09/02/2015 18:12   Mr Brain Wo Contrast  09/01/2015 CLINICAL DATA: Cardiac arrest, recovered after epinephrine injection. Progressive dyspnea. Evaluate anoxic brain injury. EXAM: MRI HEAD WITHOUT CONTRAST TECHNIQUE: Multiplanar, multiecho pulse sequences of the brain and surrounding structures were obtained without intravenous contrast. COMPARISON: CT head August 24, 2015 FINDINGS: Scattered foci of reduced diffusion include LEFT cerebellum, RIGHT caudate head, RIGHT insula, RIGHT periatrial white matter, LEFT occipital lobe, RIGHT parietal lobe measuring up to 11 mm. Larger areas demonstrate reduced diffusion. No susceptibility artifact to suggest hemorrhage. Additional patchy supratentorial white matter FLAIR T2 hyperintensities exclusive of aforementioned abnormality. Ventricles and sulci are normal for patient's age. No midline shift, mass effect or masses. No abnormal extra-axial fluid collections. Normal major intracranial vascular flow voids present at skull base. Ocular globes and orbital contents are normal. Moderate pan paranasal sinus mucosal thickening. Small bilateral mastoid effusions. No abnormal sellar expansion. No cerebellar tonsillar ectopia. No suspicious calvarial bone marrow signal. IMPRESSION: Scattered supra and infratentorial foci of acute ischemia measuring up to 11 mm, which may represent embolic phenomena, possible component of watershed ischemia. Mild chronic small vessel ischemic disease. Electronically Signed By: Elon Alas M.D. On: 09/01/2015 22:12     Assessment/Plan: Diagnosis: Cardiac arrest with hypoxic encephalopathy and embolic/watershed stroke Labs and images independently reviewed. Records reviewed and summated above. Stroke: Continue secondary stroke prophylaxis and Risk Factor Modification listed below:  Antiplatelet therapy Blood Pressure Management: Continue current medication  with  prn's with permisive HTN per primary team Statin Agent:  Diabetes management:  1. Does the need for close, 24 hr/day medical lobe supervision in concert with the patient's rehab needs make it unreasonable for this patient to be served in a less intensive setting? Potentially  2. Co-Morbidities requiring supervision/potential complications: TBI/splenectomy, bipolar disorder (cont meds), HTN (monitor and provide prns in accordance with increased physical exertion and pain), leukocytosis (cont to monitor for signs and symptoms of infection, further workup if indicated), thrombocytosis (cont to monitor) 3. Due to safety, skin/wound care, disease management, medication administration and patient education, does the patient require 24 hr/day rehab nursing? Yes 4. Does the patient require coordinated care of a physician, rehab nurse, PT (1-2 hrs/day, 5 days/week), OT (1-2 hrs/day, 5 days/week) and SLP (1-2 hrs/day, 5 days/week) to address physical and functional deficits in the context of the above medical diagnosis(es)? Potentially Addressing deficits in the following areas: balance, endurance, locomotion, strength, transferring, bathing, dressing, grooming, toileting, cognition, speech, language, swallowing and psychosocial support 5. Can the patient actively participate in an intensive therapy program of at least 3 hrs of therapy per day at least 5 days per week? Potentially 6. The potential for patient to make measurable gains while on inpatient rehab is excellent 7. Anticipated functional outcomes upon discharge from inpatient rehab are TBD with PT, supervision and min assist with OT, modified independent and supervision with SLP. 8. Estimated rehab length of stay to reach the above functional goals is: 16-19 days. 9. Does the patient have adequate social supports and living environment to accommodate these discharge functional goals? Yes 10. Anticipated D/C setting: Home 11. Anticipated post D/C  treatments: HH therapy and Home excercise program 12. Overall Rehab/Functional Prognosis: good  RECOMMENDATIONS: This patient's condition is appropriate for continued rehabilitative care in the following setting: Likely CIR after completion of medical workup, however, will await formal PT consult. Patient has agreed to participate in recommended program. Yes Note that insurance prior authorization may be required for reimbursement for recommended care.  Comment: Rehab Admissions Coordinator to follow up.  Delice Lesch, MD 09/03/2015       Revision History     Date/Time User Provider Type Action   09/03/2015 12:48 PM Ankit [name], MD Physician Sign   09/03/2015 12:33 PM Ankit [name], MD Physician Share   09/03/2015 10:23 AM Bary Leriche, PA-C Physician Assistant Share   09/03/2015 10:01 AM Bary Leriche, PA-C Physician Assistant Share   View Details Report       Routing History     Date/Time From To Method   09/03/2015 12:48 PM Ankit [name], MD Ankit [name], MD In [facility]

## 2015-09-05 NOTE — Interval H&P Note (Signed)
Marie Ramos was admitted today to Inpatient Rehabilitation with the diagnosis of Cardiac arrest with hypoxic encephalopathy and embolic/watershed stroke.  The patient's history has been reviewed, patient examined, and there is no change in status.  Patient continues to be appropriate for intensive inpatient rehabilitation.  I have reviewed the patient's chart and labs.  Questions were answered to the patient's satisfaction. The PAPE has been reviewed and assessment remains appropriate.  Ankit [name] 09/05/2015, 9:29 PM

## 2015-09-05 NOTE — Care Management Note (Signed)
Case Management Note Marvetta Gibbons RN, BSN Unit 2W-Case Manager [phone]  Patient Details  Name: Marie Ramos MRN: [id number] Date of Birth: Feb 07, 1956  Subjective/Objective:    Pt admitted s/p cardiac arrest                Action/Plan: PTA pt lived at home with spouse- independent, CIR consulted for inpt rehab- have insurance auth and bed available today- plan to d/c later today to CIR  Expected Discharge Date:    09/05/15              Expected Discharge Plan:  [name]  In-House Referral:  Clinical Social Work  Discharge planning Services  CM Consult, Medication Assistance  Post Acute Care Choice:    Choice offered to:     DME Arranged:    DME Agency:     HH Arranged:    [place] Agency:     Status of Service:  Completed, signed off  Medicare Important Message Given:    Date Medicare IM Given:    Medicare IM give by:    Date Additional Medicare IM Given:    Additional Medicare Important Message give by:     If discussed at [place] of Stay Meetings, dates discussed:  09/04/15  Additional Comments:  09/05/15- pt s/p ICD, found to have DVT started on Eliquis- benefits check completed- Pt copay will be $25- prior auth not required --info shared with pt and spouse at bedside- pt given 30 day free card along with copay assist to use once she leaves CIR- no further CM needs noted- plan for INPT rehab later today.  Dawayne Patricia, RN 09/05/2015, 4:01 PM

## 2015-09-06 ENCOUNTER — Inpatient Hospital Stay (HOSPITAL_COMMUNITY): Payer: BLUE CROSS/BLUE SHIELD | Admitting: Speech Pathology

## 2015-09-06 ENCOUNTER — Inpatient Hospital Stay (HOSPITAL_COMMUNITY): Payer: Self-pay | Admitting: *Deleted

## 2015-09-06 ENCOUNTER — Inpatient Hospital Stay (HOSPITAL_COMMUNITY): Payer: BLUE CROSS/BLUE SHIELD | Admitting: Occupational Therapy

## 2015-09-06 LAB — COMPREHENSIVE METABOLIC PANEL
ALK PHOS: 113 U/L (ref 38–126)
ALT: 30 U/L (ref 14–54)
ANION GAP: 11 (ref 5–15)
AST: 29 U/L (ref 15–41)
Albumin: 2.8 g/dL — ABNORMAL LOW (ref 3.5–5.0)
BILIRUBIN TOTAL: 0.5 mg/dL (ref 0.3–1.2)
BUN: 23 mg/dL — ABNORMAL HIGH (ref 6–20)
CALCIUM: 9.3 mg/dL (ref 8.9–10.3)
CO2: 26 mmol/L (ref 22–32)
Chloride: 103 mmol/L (ref 101–111)
Creatinine, Ser: 0.98 mg/dL (ref 0.44–1.00)
GLUCOSE: 90 mg/dL (ref 65–99)
POTASSIUM: 4.5 mmol/L (ref 3.5–5.1)
Sodium: 140 mmol/L (ref 135–145)
TOTAL PROTEIN: 5.9 g/dL — AB (ref 6.5–8.1)

## 2015-09-06 LAB — URINE CULTURE

## 2015-09-06 LAB — CBC WITH DIFFERENTIAL/PLATELET
BASOS PCT: 0 %
Basophils Absolute: 0 10*3/uL (ref 0.0–0.1)
Eosinophils Absolute: 0.3 10*3/uL (ref 0.0–0.7)
Eosinophils Relative: 3 %
HEMATOCRIT: 36.6 % (ref 36.0–46.0)
HEMOGLOBIN: 11.3 g/dL — AB (ref 12.0–15.0)
LYMPHS ABS: 2 10*3/uL (ref 0.7–4.0)
Lymphocytes Relative: 17 %
MCH: 28.5 pg (ref 26.0–34.0)
MCHC: 30.9 g/dL (ref 30.0–36.0)
MCV: 92.4 fL (ref 78.0–100.0)
MONO ABS: 1 10*3/uL (ref 0.1–1.0)
MONOS PCT: 8 %
NEUTROS ABS: 8.6 10*3/uL — AB (ref 1.7–7.7)
NEUTROS PCT: 72 %
Platelets: 544 10*3/uL — ABNORMAL HIGH (ref 150–400)
RBC: 3.96 MIL/uL (ref 3.87–5.11)
RDW: 15.2 % (ref 11.5–15.5)
WBC: 11.9 10*3/uL — ABNORMAL HIGH (ref 4.0–10.5)

## 2015-09-06 NOTE — Progress Notes (Signed)
C/O pain to left chest site, PRN tylenol given @ 2201 & 0429. Dsg. CD& I. Requested PRN xanax at 2300, rested quietly during night. Left chest dressing CD & I. Marie Ramos A

## 2015-09-06 NOTE — Evaluation (Signed)
Physical Therapy Assessment and Plan  Patient Details  Name: Marie Ramos MRN: [id number] Date of Birth: 29-Nov-1955  PT Diagnosis: Difficulty walking, Hemiplegia non-dominant and Impaired cognition Rehab Potential: Excellent ELOS: 7 days   Today's Date: 09/06/2015 PT Individual Time: 1110-1210 PT Individual Time Calculation (min): 60 min    Problem List:  Patient Active Problem List   Diagnosis Date Noted  . E. coli UTI 09/05/2015  . Anoxic brain injury ([place]) 09/05/2015  . Abnormality of gait   . Acute bilat watershed infarction [facility])   . Notalgia   . Acute systolic congestive heart failure ([place])   . Benign essential HTN   . Acute lower UTI   . AKI (acute kidney injury) ([place])   . History of traumatic brain injury   . Bipolar affective disorder in remission ([place])   . Leukocytosis   . Thrombocytosis ([place])   . Cerebral infarction due to embolism of cerebral artery ([place])   . Anoxic brain damage ([facility])   . Cough   . Essential hypertension   . Systolic dysfunction with acute on chronic heart failure ([place])   . Hypertrophic obstructive cardiomyopathy ([place]) 08/29/2015  . QT prolongation 08/29/2015  . Hypokalemia 08/29/2015  . Acute respiratory failure ([place])   . Cardiac arrest ([place]) 08/24/2015    Past Medical History:  Past Medical History  Diagnosis Date  . Bipolar 1 disorder ([place])   . Hypertrophic obstructive cardiomyopathy ([place]) 08/29/2015  . Cardiac arrest ([place]) 08/24/2015  . QT prolongation 08/29/2015  . TBI (traumatic brain injury) [facility]) 2011    Inpatient rehab [facility]  . Macular degeneration   . Chronically dry eyes   . Ventricular fibrillation ([place]) 09/03/15    MDT ICD Dr. Rayann Heman   Past Surgical History:  Past Surgical History  Procedure Laterality Date  . Cardiac catheterization N/A 08/24/2015    Procedure: Left Heart Cath and Coronary Angiography;  Surgeon: Sherren Mocha, MD;  Location: [place] [facility];  Service: Cardiovascular;  Laterality: N/A;  . Abdominal  hysterectomy    . Tonsillectomy    . Splenectomy, total  2011  . Ep implantable device N/A 09/03/2015    MDT dual chamber ICD    Assessment & Plan Clinical Impression:60 y.o. female with history of fall with TBI/splenectomy, bipolar disorder with recent increase in psychiatric medications who was admitted on 08/24/15 after witnessed cardiac arrest. Patient unresponsive and pulseless and AED placed by school nurse with 2 shocks and CPR initiated. EMS evaluation showed PEA and ROSC after epinephrine--down time 10 minutes. Initial rhythm presumed to be VT. Patient intubated and cardiac cath was negative for CAD. She was treated with hypothermia protocol and arrest felt to be due to QT elongation. 2D echo with asymmetric septal hypertrophic CM without obstruction. Cardiac MRI showed hypertensive rather than HCM with EF 45%. She was extubated without difficulty on 02/23 and Psych consulted for medication adjustment and seroquel and adderral discontinued. Trileptal added to help with mood stabilization and high levels of anxiety managed with prn Xanax. Neurology consulted due to confusion and MRI brain done revealing scattered supra and infratentorial foci of acute ischemia measuring up to 11 mm-- question embolic phenomena and possible component of watershed ischemia. MRA without significant stenosis or aneurysm. Dr. Erlinda Hong recommended ASA and statin for strokes due to SVD v/s embolic due to cardiac cath. Acute systolic dysfunction treated with coreg and ACE titration and ICD to be placed on 02/27. BLE dopplers done revealed left posterior tibial and left peroneal veins  DVT therefore patient started on Eliquis. Therapy initiated and patient noted to have impaired cognition with generalized weakness, SOB as well as decreased balance.  Patient transferred to CIR on 09/05/2015 .   Patient currently requires min with mobility secondary to muscle weakness, decreased cardiorespiratoy endurance, unbalanced muscle  activation, decreased attention, decreased awareness, decreased problem solving, decreased safety awareness and decreased memory and decreased standing balance.  Prior to hospitalization, patient was independent  with mobility and lived with Spouse in a House ([place]) home.  Home access is  Level entry.  Patient will benefit from skilled PT intervention to maximize safe functional mobility, minimize fall risk and decrease caregiver burden for planned discharge home with 24 hour supervision.  Anticipate patient will benefit from follow up [place] at discharge.  PT - End of Session Activity Tolerance: Tolerates < 10 min activity with changes in vital signs Endurance Deficit: Yes Endurance Deficit Description: Pt fatigued, needing seated rest after each task PT Assessment Rehab Potential (ACUTE/IP ONLY): Excellent PT Patient demonstrates impairments in the following area(s): Balance;Endurance;Motor;Pain;Safety PT Transfers Functional Problem(s): Bed Mobility;Bed to Chair;Car;Furniture PT Locomotion Functional Problem(s): Ambulation;Stairs PT Plan PT Intensity: Minimum of 1-2 x/day ,45 to 90 minutes PT Frequency: 5 out of 7 days PT Duration Estimated Length of Stay: 7 days PT Treatment/Interventions: Ambulation/gait training;Balance/vestibular training;Cognitive remediation/compensation;Community reintegration;Discharge planning;Disease management/prevention;DME/adaptive equipment instruction;Functional mobility training;Neuromuscular re-education;Pain management;Patient/family education;Psychosocial support;Stair training;Therapeutic Activities;Therapeutic Exercise;UE/LE Strength taining/ROM PT Transfers Anticipated Outcome(s): Mod I  PT Locomotion Anticipated Outcome(s): Mod I household distances, S community PT Recommendation Recommendations for Other Services: Neuropsych consult Follow Up Recommendations: 24 hour supervision/assistance;Outpatient PT Patient destination: Home Equipment  Recommended: Rolling walker with 5" wheels;To be determined Equipment Details: Pt may have RW at home  Skilled Therapeutic Intervention PT tx initiated upon eval including functional mobility, gait, NMR and cognitive remediation. Pt oriented to unit, call bell, PT POC, and principles of rehab and recovery. Pt was given 3 memory words at start of tx and was unable to recall after 3 min or throughout entire tx despite education on strategies. Pt was educated on fall risk reduction and precautions. Pt instructed in transfer training for car and furniture, sit<>stands, sitting balance, standing balance training, stair training x4, and gait with/without RW. Pt required up to Min A throughout, but progressed to S with practice and cues for safety. Attempted Nustep, but pt c/o LLE pain, so discontinued after 2 minutes. Gait distances including no device x20' and 3x100 with RW and x10' over uneven surfaces. Pt left up in room with lunch and all needs in reach.    PT Evaluation Precautions/Restrictions Precautions Precautions: Fall Precaution Comments: pacemaker precautions Restrictions Weight Bearing Restrictions: No General   Vital SignsTherapy Vitals Pulse Rate: 75 BP: 134/81 mmHg Oxygen Therapy SpO2: 99 % O2 Device: Not Delivered Pain Pain Assessment Pain Assessment: No/denies pain Home Living/Prior Functioning Home Living Living Arrangements: Spouse/significant other Available Help at Discharge: Family;Friend(s);Available 24 hours/day Type of Home: House ([place]) Home Access: Level entry Home Layout: Two level Alternate Level Stairs-Number of Steps: 12 Alternate Level Stairs-Rails: Can reach both  Lives With: Spouse Prior Function Level of Independence: Independent with gait;Independent with transfers;Independent with basic ADLs  Able to Take Stairs?: Yes Driving: Yes Vocation: Part time employment Vision/Perception  Vision - History Baseline Vision: Wears glasses all the  time Visual History:  (retinosa pigmentosa, dry eyes) Patient Visual Report: No change from baseline Vision - Assessment Eye Alignment: Within Functional Limits Vision Assessment: Vision tested Additional Comments: Difficulty sustaining lateral  gaze initially, but improved with practice.  Perception Perception: Within Functional Limits Praxis Praxis: Intact  Cognition Overall Cognitive Status: Impaired/Different from baseline Arousal/Alertness: Awake/alert Orientation Level: Oriented X4 Attention: Sustained Sustained Attention: Impaired Sustained Attention Impairment: Verbal basic Memory: Impaired Memory Impairment: Storage deficit;Retrieval deficit;Decreased recall of new information;Decreased short term memory Decreased Short Term Memory: Verbal basic Awareness: Impaired Awareness Impairment: Emergent impairment Problem Solving: Impaired Problem Solving Impairment: Functional complex Executive Function: Self Monitoring;Self Correcting Self Monitoring: Impaired Self Monitoring Impairment: Functional complex Self Correcting: Impaired Self Correcting Impairment: Functional complex Behaviors: Poor frustration tolerance Safety/Judgment: Appears intact Sensation Sensation Light Touch: Appears Intact Stereognosis: Appears Intact Hot/Cold: Appears Intact Proprioception: Appears Intact Coordination Gross Motor Movements are Fluid and Coordinated: Yes Motor  Motor Motor: Hemiplegia Motor - Skilled Clinical Observations: Slightly decreased strength LLE/UE  Mobility Bed Mobility Bed Mobility: Supine to Sit Supine to Sit: 4: Min assist Supine to Sit Details (indicate cue type and reason): Trunkl lifting assist Sit to Supine: 5: Supervision Transfers Transfers: Yes Stand Pivot Transfers: 4: Min assist Stand Pivot Transfer Details (indicate cue type and reason): Staedy assist initially due to forward lean Locomotion  Ambulation Ambulation: Yes Ambulation/Gait Assistance:  5: Supervision Ambulation Distance (Feet): 100 Feet Assistive device: Rolling walker Ambulation/Gait Assistance Details: Cues for posture, increased laterael sway without device Stairs / Additional Locomotion Stairs: Yes Stairs Assistance: 4: Min assist Stairs Assistance Details (indicate cue type and reason): Steadyign assist and cues for sequence Stair Management Technique: One rail Right;Step to pattern;Forwards Number of Stairs: 4 Height of Stairs: 6 Wheelchair Mobility Wheelchair Mobility: No  Trunk/Postural Assessment  Cervical Assessment Cervical Assessment: Within Functional Limits Thoracic Assessment Thoracic Assessment: Exceptions to [facility] (Mildly kyphotic) Lumbar Assessment Lumbar Assessment: Within Functional Limits Postural Control Postural Control: Within Functional Limits  Balance Balance Balance Assessed: Yes Dynamic Sitting Balance Dynamic Sitting - Balance Support: No upper extremity supported;Feet unsupported Dynamic Sitting - Level of Assistance: 6: Modified independent (Device/Increase time) Static Standing Balance Static Standing - Balance Support: No upper extremity supported;During functional activity Static Standing - Level of Assistance: 5: Stand by assistance Static Standing - Comment/# of Minutes: 57mn Dynamic Standing Balance Dynamic Standing - Balance Support: No upper extremity supported;During functional activity Dynamic Standing - Level of Assistance: 4: Min assist Dynamic Standing - Balance Activities: Lateral lean/weight shifting;Forward lean/weight shifting;Reaching for objects Extremity Assessment  RUE Assessment RUE Assessment: Within Functional Limits LUE Assessment LUE Assessment: Exceptions to [facility](UTA due to pacemaker precautions and sore from procedure. ) RLE Assessment RLE Assessment: Within Functional Limits LLE Assessment LLE Assessment: Exceptions to [facility](Slightly decreased velocity of movement ~4/5 throughout)   See Function  Navigator for Current Functional Status.   Refer to Care Plan for Long Term Goals  Recommendations for other services: Neuropsych  Discharge Criteria: Patient will be discharged from PT if patient refuses treatment 3 consecutive times without medical reason, if treatment goals not met, if there is a change in medical status, if patient makes no progress towards goals or if patient is discharged from hospital.  The above assessment, treatment plan, treatment alternatives and goals were discussed and mutually agreed upon: by patient   CKennieth Rad PT, DPT  09/06/2015, 12:47 PM

## 2015-09-06 NOTE — Progress Notes (Signed)
Social Work  Social Work Assessment and Plan  Patient Details  Name: Marie Ramos MRN: [id number] Date of Birth: [birth year]  Today's Date: 09/06/2015  Problem List:  Patient Active Problem List   Diagnosis Date Noted  . E. coli UTI 09/05/2015  . Anoxic brain injury ([place]) 09/05/2015  . Abnormality of gait   . Acute bilat watershed infarction [facility])   . Notalgia   . Acute systolic congestive heart failure ([place])   . Benign essential HTN   . Acute lower UTI   . AKI (acute kidney injury) ([place])   . History of traumatic brain injury   . Bipolar affective disorder in remission ([place])   . Leukocytosis   . Thrombocytosis ([place])   . Cerebral infarction due to embolism of cerebral artery ([place])   . Anoxic brain damage ([facility])   . Cough   . Essential hypertension   . Systolic dysfunction with acute on chronic heart failure ([place])   . Hypertrophic obstructive cardiomyopathy ([place]) 08/29/2015  . QT prolongation 08/29/2015  . Hypokalemia 08/29/2015  . Acute respiratory failure ([place])   . Cardiac arrest ([place]) 08/24/2015   Past Medical History:  Past Medical History  Diagnosis Date  . Bipolar 1 disorder ([place])   . Hypertrophic obstructive cardiomyopathy ([place]) 08/29/2015  . Cardiac arrest ([place]) 08/24/2015  . QT prolongation 08/29/2015  . TBI (traumatic brain injury) [facility]) 2011    Inpatient rehab [facility]  . Macular degeneration   . Chronically dry eyes   . Ventricular fibrillation ([place]) 09/03/15    MDT ICD Dr. Rayann Heman   Past Surgical History:  Past Surgical History  Procedure Laterality Date  . Cardiac catheterization N/A 08/24/2015    Procedure: Left Heart Cath and Coronary Angiography;  Surgeon: Sherren Mocha, MD;  Location: [place] [facility];  Service: Cardiovascular;  Laterality: N/A;  . Abdominal hysterectomy    . Tonsillectomy    . Splenectomy, total  2011  . Ep implantable device N/A 09/03/2015    MDT dual chamber ICD   Social History:  reports that she has never smoked. She has never  used smokeless tobacco. She reports that she does not drink alcohol or use illicit drugs.  Family / Support Systems Marital Status: Married Patient Roles: Spouse, Parent, Other (Comment) (substitute teacher) Spouse/Significant Other: Cashay Bingman @ (C) [phone] Children: daughter, Lake Bells (local) @ (C) [phone];  son, Aitanna Kitzinger [facility]) @ ([phone] Anticipated Caregiver: husband - he is currently not working Ability/Limitations of Caregiver: [name] is retired and available to assist as much as needed Caregiver Availability: 24/7 Family Dynamics: Husband at bedside and very supportive of pt during assessment interview.  Pt and husband laughing easily with one another and speaking affectionately of their adult children.  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 Legal Hisotry/Current Legal Issues: None Guardian/Conservator: None - per MD, pt not yet fully capable of making decisions on her own behalf - defer to spouse.   Abuse/Neglect Physical Abuse: Denies Verbal Abuse: Denies Sexual Abuse: Denies Exploitation of patient/patient's resources: Denies Self-Neglect: Denies  Emotional Status Pt's affect, behavior adn adjustment status: Pt very pleasant and completes assessment interview without difficulty.  She is smiling throughout and appears very relaxed.  She looks to spouse to assist with some questions as she has little recall of events around admit and just prior.  She denies any significant emotional distress.  Speaks openly about her bipolar dz.  Discuss likely to refer for neuropsychology consult for additional  cognitive testing in addition to ST - pt agreeable. Recent Psychosocial Issues: None per pt and spouse Pyschiatric History: Pt and spouse report that pt had her 1st "episode" of biploar behaviors in 1995, however, not formally diagnosed until they moved to [facility] in 1997.  She has never been hospitalized due to bipolar.  Is followed by a local psychiatric NP Substance Abuse History: None  Patient / Family Perceptions, Expectations & Goals Pt/Family understanding of illness & functional limitations: Pt and spouse with good understanding of her cardiac arrest, anoxia and strokes.  Good understanding of her current functional limitations/ need for CIR. Premorbid pt/family roles/activities: Pt was working f/t as Physicist, medical.  Completely independent. Anticipated changes in roles/activities/participation: Will likely need to have 24/7 supervision at least dependent on gains. Pt/family expectations/goals: "I would like to be able to go back to work if I can."  [employer]: None Premorbid Home Care/DME Agencies: None Transportation available at discharge: yes Resource referrals recommended: Neuropsychology  Discharge Planning Living Arrangements: Spouse/significant other Support Systems: Spouse/significant other, Children, Friends/neighbors Type of Residence: Private residence Administrator, sports: Multimedia programmer (specify) ([place]) Museum/gallery curator Resources: Employment Museum/gallery curator Screen Referred: No Living Expenses: Higher education careers adviser Management: Spouse Does the patient have any problems obtaining your medications?: No Home Management: pt and spouse Patient/Family Preliminary Plans: Pt to return home with her spouse able to provide 24/7 assistance. Social Work Anticipated Follow Up Needs: HH/OP Expected length of stay: 10-13 days  Clinical Impression Very pleasant woman here following cardiac arrest, anoxia and watershed strokes.  Actually appears to be improving  both cognitively and physically very well (spouse agrees).  Husband very supportive and will be able to provide 24/7 assistance at home.  Pt reports bipolar dz diagnosed in 1997, however, controlled with medication.  Denies any current, significant emotional distress.  Will follow for support and d/c planning needs.  Maryanna Stuber 09/06/2015, 11:11 AM

## 2015-09-06 NOTE — Progress Notes (Signed)
[facility] PHYSICAL MEDICINE [facility] REHABILITATION     PROGRESS NOTE  Subjective/Complaints:  Pt sitting up in her chair this AM awaiting the beginning of therapies.  She slept well overnight.    ROS: Denies CP, SOB, N/V/D.  Objective: Vital Signs: Blood pressure 143/78, pulse 64, temperature 97.5 F (36.4 C), temperature source Oral, resp. rate 16, height 5\' 2"  (1.575 m), weight 103.057 kg (227 lb 3.2 oz), SpO2 100 %. No results found.  Recent Labs  09/05/15 0347 09/06/15 0555  WBC 14.6* 11.9*  HGB 11.5* 11.3*  HCT 37.1 36.6  PLT 528* 544*    Recent Labs  09/05/15 0347 09/05/15 1003 09/06/15 0555  NA 139  --  140  K 5.9* 4.4 4.5  CL 102  --  103  GLUCOSE 71  --  90  BUN 27*  --  23*  CREATININE 0.94  --  0.98  CALCIUM 9.2  --  9.3   CBG (last 3)   Recent Labs  09/05/15 0607 09/05/15 1153 09/05/15 1638  GLUCAP 74 49* 68    Wt Readings from Last 3 Encounters:  09/05/15 103.057 kg (227 lb 3.2 oz)  09/04/15 100.245 kg (221 lb)    Physical Exam:  BP 143/78 mmHg  Pulse 64  Temp(Src) 97.5 F (36.4 C) (Oral)  Resp 16  Ht 5\' 2"  (1.575 m)  Wt 103.057 kg (227 lb 3.2 oz)  BMI 41.54 kg/m2  SpO2 100% Constitutional: She appears well-developed and well-nourished.  NAD. Vital signs reviewed.  HENT: Normocephalic and atraumatic.  Eyes: Conjunctivae and EOM are normal.  Cardiovascular: Normal rate and regular rhythm.No murmur heard. Respiratory: Effort normal and breath sounds normal.  GI: Soft. Bowel sounds are normal. She exhibits no distension. There is no tenderness.  Musculoskeletal: She exhibits no edema or tenderness.  Neurological: She is alert and oriented.  Able to follow simple commands.  Motor:  RUE: 4+/5 proximal to distal LUE: 4+/5 proximal to distal LLE: Hip flexion 4+/5, knee extension 4+/5, ankle dorsi/plantarflexion 5/5  RLE: Hip flexion 4+/5, knee extension 4+/5, ankle dorsi/plantarflexion 5/5  Skin: Skin is warm and dry. No rash noted.   Psychiatric: She has a normal mood and affect. Her behavior is normal.   Assessment/Plan: 1. Functional deficits secondary to Cardiac arrest with hypoxic encephalopathy and embolic/watershed stroke which require 3+ hours per day of interdisciplinary therapy in a comprehensive inpatient rehab setting. Physiatrist is providing close team supervision and 24 hour management of active medical problems listed below. Physiatrist and rehab team continue to assess barriers to discharge/monitor patient progress toward functional and medical goals.  Function:  Bathing Bathing position      Bathing parts      Bathing assist        Upper Body Dressing/Undressing Upper body dressing                    Upper body assist        Lower Body Dressing/Undressing Lower body dressing                                  Lower body assist        Toileting Toileting   Toileting steps completed by patient: Adjust clothing prior to toileting, Adjust clothing after toileting Toileting steps completed by helper: Performs perineal hygiene Toileting Assistive Devices: Grab bar or rail  Toileting assist Assist level: Touching or steadying assistance (Pt.75%)  Transfers Chair/bed Physiological scientist Comprehension Comprehension assist level: Follows basic conversation/direction with extra time/assistive device  Expression Expression assist level: Expresses complex 90% of the time/cues < 10% of the time  Social Interaction Social Interaction assist level: Interacts appropriately with others with medication or extra time (anti-anxiety, antidepressant).  Problem Solving Problem solving assist level: Solves basic problems with no assist  Memory Memory assist level: Recognizes or recalls 90% of the time/requires cueing < 10% of the time (decreased safety awareness)    Medical Problem List and Plan: 1.  Weakness, abnormality of gait, and cognitive dysfunction secondary to Cardiac arrest with hypoxic encephalopathy and embolic/watershed stroke.  Begin CIR 2. LLE DVT/Anticoagulation: Pharmaceutical: Other (comment)-Eliquis 3. Pain Management: Tylenol prn effective for back/chest soreness.  4. Mood: Team to provide ego support. LCSW to follow for evaluation and support.  5. Neuropsych: This patient is not fully capable of making decisions on her own behalf. 6. Skin/Wound Care: Monitor wound daily. Routine pressure relief measures.  7. Fluids/Electrolytes/Nutrition: Monitor I/O.   BMP within acceptable range on 3/2 8. VT/PEA arrest: NO DRIVING FOR 6 MONTHS POST ARREST. ICD in place.  9. Acute systolic dysfunction: Likely due to hypertensive CM--on Coreg and Ace.  10. HTN: Monitor BP bid. Continue coreg and Lisinopril.  11. Supra and infratentorial infarcts could be embolic due to cardiac cath v/s small vessel disease:  12. Bipolar disease: Mood stable on Effexor XR with xanax prn.  13. Leucocytosis: Likely due to E coli UTI. Started on ceftriaxone and changed to ceftin D # 3  sensitivities from 2/25 pending.   Improving: 11.9 on 3/2 14. Acute renal insufficiency: push po fluids.   LOS (Days) 1 A FACE TO FACE EVALUATION WAS PERFORMED  Marie Ramos [name] 09/06/2015 8:29 AM

## 2015-09-06 NOTE — Evaluation (Signed)
Speech Language Pathology Assessment and Plan  Patient Details  Name: Marie Ramos MRN: [id number] Date of Birth: 09-Nov-1955  SLP Diagnosis: Cognitive Impairments;Speech and Language deficits  Rehab Potential: Good ELOS: 7 days   Today's Date: 09/06/2015 SLP Individual Time: 8850-2774 SLP Individual Time Calculation (min): 60 min  Problem List:  Patient Active Problem List   Diagnosis Date Noted  . E. coli UTI 09/05/2015  . Anoxic brain injury ([place]) 09/05/2015  . Abnormality of gait   . Acute bilat watershed infarction [facility])   . Notalgia   . Acute systolic congestive heart failure ([place])   . Benign essential HTN   . Acute lower UTI   . AKI (acute kidney injury) ([place])   . History of traumatic brain injury   . Bipolar affective disorder in remission ([place])   . Leukocytosis   . Thrombocytosis ([place])   . Cerebral infarction due to embolism of cerebral artery ([place])   . Anoxic brain damage ([facility])   . Cough   . Essential hypertension   . Systolic dysfunction with acute on chronic heart failure ([place])   . Hypertrophic obstructive cardiomyopathy ([place]) 08/29/2015  . QT prolongation 08/29/2015  . Hypokalemia 08/29/2015  . Acute respiratory failure ([place])   . Cardiac arrest ([place]) 08/24/2015   Past Medical History:  Past Medical History  Diagnosis Date  . Bipolar 1 disorder ([place])   . Hypertrophic obstructive cardiomyopathy ([place]) 08/29/2015  . Cardiac arrest ([place]) 08/24/2015  . QT prolongation 08/29/2015  . TBI (traumatic brain injury) [facility]) 2011    Inpatient rehab [facility]  . Macular degeneration   . Chronically dry eyes   . Ventricular fibrillation ([place]) 09/03/15    MDT ICD Dr. Rayann Heman   Past Surgical History:  Past Surgical History  Procedure Laterality Date  . Cardiac catheterization N/A 08/24/2015    Procedure: Left Heart Cath and Coronary Angiography;  Surgeon: Sherren Mocha, MD;  Location: [place] [facility];  Service: Cardiovascular;  Laterality: N/A;  . Abdominal  hysterectomy    . Tonsillectomy    . Splenectomy, total  2011  . Ep implantable device N/A 09/03/2015    MDT dual chamber ICD    Assessment / Plan / Recommendation Clinical Impression Marie Ramos is a 60 y.o. female with history of fall with TBI/splenectomy, bipolar disorder with recent increase in psychiatric medications who was admitted on 08/24/15 after witnessed cardiac arrest. Patient unresponsive and pulseless and AED placed by school nurse with 2 shocks and CPR initiated. EMS evaluation showed PEA and ROSC after epinephrine--down time 10 minutes. She was extubated without difficulty on 02/23 and Psych consulted for medication adjustment and seroquel and adderral discontinued. Trileptal added to help with mood stabilization and high levels of anxiety managed with prn Xanax. Neurology consulted due to confusion and MRI brain done revealing scattered supra and infratentorial foci of acute ischemia measuring up to 11 mm-- question embolic phenomena and possible component of watershed ischemia. Acute systolic dysfunction treated with coreg and ACE titration and ICD to be placed today. Therapy initiated and patient noted to have impaired cognition with generalized weakness, SOB as well as decreased balance. CIR recommended for follow up therapy.   Patient was admitted to [place] [facility] 09/05/15.  Patient completed the [facility] Cognitive Assessment, 7.2 and received a score of 16/30.  She demonstrates moderately-severe cognitive impairments characterized by impaired orientation at times, sustained attention, memory (short term and working) as well as difficulty with problem solving and word finding.  This  impacts patient's overall safety with functional tasks. As a result, patient would benefit from skilled SLP intervention in order to maximize functional independence prior to discharge. Anticipate patient will require 24 hour supervision at home and follow up SLP services.     Skilled Therapeutic Interventions          Cognitive-linguistic evaluation completed with results and recommendations reviewed with patient and spouse.  Spouse reports some baseline deficits but patient is currently worse.  Patient required frequent re-directions to complete tasks during evaluation.       SLP Assessment  Patient will need skilled [place] [facility] Services during CIR admission    Recommendations  Recommendations for Other Services:  (None) Patient destination: Home Follow up Recommendations: 24 hour supervision/assistance;Outpatient SLP Equipment Recommended: None recommended by SLP    SLP Frequency 3 to 5 out of 7 days   SLP Duration  SLP Intensity  SLP Treatment/Interventions 14-16 days  Minumum of 1-2 x/day, 30 to 90 minutes  Cognitive remediation/compensation;Cueing hierarchy;Environmental controls;Functional tasks;Internal/external aids;Medication managment;Patient/family education;Speech/Language facilitation;Therapeutic Activities    Pain Pain Assessment Pain Assessment: No/denies pain  Prior Functioning Cognitive/Linguistic Baseline: Baseline deficits Baseline deficit details: husband reports that she was unable to pass teachers exam PTA, but she is worse now Type of Home: House  Lives With: Spouse Available Help at Discharge: Family;Friend(s);Available 24 hours/day Education: MBA Vocation: Part time employment  Function:  Cognition Comprehension Comprehension assist level: Follows basic conversation/direction with extra time/assistive device  Expression   Expression assist level: Expresses complex 90% of the time/cues < 10% of the time  Social Interaction Social Interaction assist level: Interacts appropriately 90% of the time - Needs monitoring or encouragement for participation or interaction.  Problem Solving Problem solving assist level: Solves basic problems with no assist  Memory Memory assist level: Recognizes or recalls 75 - 89%  of the time/requires cueing 10 - 24% of the time   Short Term Goals: Week 1: SLP Short Term Goal 1 (Week 1): short term goals = long term goals due to length of stay  Refer to Care Plan for Long Term Goals  Recommendations for other services: None  Discharge Criteria: Patient will be discharged from SLP if patient refuses treatment 3 consecutive times without medical reason, if treatment goals not met, if there is a change in medical status, if patient makes no progress towards goals or if patient is discharged from hospital.  The above assessment, treatment plan, treatment alternatives and goals were discussed and mutually agreed upon: by patient and by family  Carmelia Roller., CCC-SLP [phone]  [place] 09/06/2015, 9:57 AM

## 2015-09-06 NOTE — Evaluation (Signed)
Occupational Therapy Assessment and Plan  Patient Details  Name: Marie Ramos MRN: [id number] Date of Birth: 1955-11-25  OT Diagnosis: acute pain, cognitive deficits, hemiplegia affecting non-dominant side and muscle weakness (generalized) Rehab Potential: Rehab Potential (ACUTE ONLY): Good ELOS: 7 days   Today's Date: 09/06/2015 OT Individual Time: 1300-1430 OT Individual Time Calculation (min): 90 min     Problem List:  Patient Active Problem List   Diagnosis Date Noted  . E. coli UTI 09/05/2015  . Anoxic brain injury ([place]) 09/05/2015  . Abnormality of gait   . Acute bilat watershed infarction [facility])   . Notalgia   . Acute systolic congestive heart failure ([place])   . Benign essential HTN   . Acute lower UTI   . AKI (acute kidney injury) ([place])   . History of traumatic brain injury   . Bipolar affective disorder in remission ([place])   . Leukocytosis   . Thrombocytosis ([place])   . Cerebral infarction due to embolism of cerebral artery ([place])   . Anoxic brain damage ([facility])   . Cough   . Essential hypertension   . Systolic dysfunction with acute on chronic heart failure ([place])   . Hypertrophic obstructive cardiomyopathy ([place]) 08/29/2015  . QT prolongation 08/29/2015  . Hypokalemia 08/29/2015  . Acute respiratory failure ([place])   . Cardiac arrest ([place]) 08/24/2015    Past Medical History:  Past Medical History  Diagnosis Date  . Bipolar 1 disorder ([place])   . Hypertrophic obstructive cardiomyopathy ([place]) 08/29/2015  . Cardiac arrest ([place]) 08/24/2015  . QT prolongation 08/29/2015  . TBI (traumatic brain injury) [facility]) 2011    Inpatient rehab [facility]  . Macular degeneration   . Chronically dry eyes   . Ventricular fibrillation ([place]) 09/03/15    MDT ICD Dr. Rayann Heman   Past Surgical History:  Past Surgical History  Procedure Laterality Date  . Cardiac catheterization N/A 08/24/2015    Procedure: Left Heart Cath and Coronary Angiography;  Surgeon: Sherren Mocha, MD;  Location: [place] [facility];  Service: Cardiovascular;  Laterality: N/A;  . Abdominal hysterectomy    . Tonsillectomy    . Splenectomy, total  2011  . Ep implantable device N/A 09/03/2015    MDT dual chamber ICD    Assessment & Plan Clinical Impression: Patient is a 60 y.o. year old female with history of fall with TBI/splenectomy, bipolar disorder with recent increase in psychiatric medications who was admitted on 08/24/15 after witnessed cardiac arrest. Patient unresponsive and pulseless and AED placed by school nurse with 2 shocks and CPR initiated. EMS evaluation showed PEA and ROSC after epinephrine--down time 10 minutes. Initial rhythm presumed to be VT. Patient intubated and cardiac cath was negative for CAD. She was treated with hypothermia protocol and arrest felt to be due to QT elongation. 2D echo with asymmetric septal hypertrophic CM without obstruction. Cardiac MRI showed hypertensive rather than HCM with EF 45%. She was extubated without difficulty on 02/23 and Psych consulted for medication adjustment and seroquel and adderral discontinued. Trileptal added to help with mood stabilization and high levels of anxiety managed with prn Xanax. Neurology consulted due to confusion and MRI brain done revealing scattered supra and infratentorial foci of acute ischemia measuring up to 11 mm-- question embolic phenomena and possible component of watershed ischemia. MRA without significant stenosis or aneurysm. Dr. Erlinda Hong recommended ASA and statin for strokes due to SVD v/s embolic due to cardiac cath. Acute systolic dysfunction treated with coreg and ACE titration and ICD  to be placed on 02/27. BLE dopplers done yesterday and revealed left posterior tibial and left peroneal veins DVT therefore patient started on Eliquis. Therapy initiated and patient noted to have impaired cognition with generalized weakness, SOB as well as decreased balance. CIR recommended for follow up therapy .  Patient transferred to CIR on  09/05/2015 .    Patient currently requires min with basic self-care skills and IADL secondary to muscle weakness, decreased cardiorespiratoy endurance, decreased safety awareness and decreased memory and decreased sitting balance, decreased standing balance and decreased balance strategies.  Prior to hospitalization, patient could complete ADLs and IADLs with independent .  Patient will benefit from skilled intervention to increase independence with basic self-care skills and increase level of independence with iADL prior to discharge home with care partner.  Anticipate patient will require intermittent supervision and follow up outpatient.  OT - End of Session Activity Tolerance: Decreased this session Endurance Deficit: Yes Endurance Deficit Description: Pt needing seated rest break multiple times secondary to fatigue OT Assessment Rehab Potential (ACUTE ONLY): Good OT Patient demonstrates impairments in the following area(s): Balance;Endurance;Motor;Pain;Safety OT Basic ADL's Functional Problem(s): Grooming;Bathing;Dressing;Toileting OT Advanced ADL's Functional Problem(s): Simple Meal Preparation;Laundry OT Transfers Functional Problem(s): Toilet;Tub/Shower OT Additional Impairment(s): None OT Plan OT Intensity: Minimum of 1-2 x/day, 45 to 90 minutes OT Frequency: 5 out of 7 days OT Duration/Estimated Length of Stay: 7 days OT Treatment/Interventions: Balance/vestibular training;Cognitive remediation/compensation;Community reintegration;Patient/family education;Self Care/advanced ADL retraining;Therapeutic Exercise;UE/LE Coordination activities;UE/LE Strength taining/ROM;Therapeutic Activities;Psychosocial support;Pain management;Functional mobility training;DME/adaptive equipment instruction;Discharge planning OT Self Feeding Anticipated Outcome(s): n/a OT Basic Self-Care Anticipated Outcome(s): mod I  OT Toileting Anticipated Outcome(s): mod I  OT Bathroom Transfers Anticipated  Outcome(s): mod I - toilet, supervision - shower OT Recommendation Recommendations for Other Services: Other (comment) (none at this time) Patient destination: Home Follow Up Recommendations: Outpatient OT Equipment Recommended: To be determined   Skilled Therapeutic Intervention Upon entering the room, pt seated in recliner chair with 5/10 c/o pain from pacemaker. RN notified. OT educated pt on OT, POC, and goals with pt verbalizing understanding. Pt eager for shower this session with OT covering IV and pacemaker site in order to decrease risk of infection from water. Pt ambulated with RW and supervision into walk in shower for bathing. Sit <>stand from bench with steady assist for hygiene. Pt dressing from recliner chair with steady assist for standing balance with LB clothing management. Pt requiring multiple rest breaks secondary to fatigue. Pt remaining in wheelchair at end of session with call bell and all needed items within reach upon exiting the room.   OT Evaluation Precautions/Restrictions  Precautions Precautions: Fall Precaution Comments: pacemaker precautions Restrictions Weight Bearing Restrictions: No General   Vital Signs Therapy Vitals Temp: 97.9 F (36.6 C) Temp Source: Oral Pulse Rate: 75 Resp: 18 BP: (!) 145/74 mmHg Patient Position (if appropriate): Sitting Oxygen Therapy SpO2: 100 % O2 Device: Not Delivered Pain Pain Assessment Pain Assessment: 0-10 Pain Score: 5  Pain Type: Acute pain Pain Location: Chest Pain Orientation: Left Pain Descriptors / Indicators: Aching;Sore Pain Frequency: Occasional Pain Onset: Gradual Patients Stated Pain Goal: 2 Pain Intervention(s): RN made aware Multiple Pain Sites: No Home Living/Prior Functioning Home Living Living Arrangements: Spouse/significant other Available Help at Discharge: Family, Friend(s), Available 24 hours/day Type of Home: House Home Access: Level entry Home Layout: Two level Alternate Level  Stairs-Number of Steps: 12 Alternate Level Stairs-Rails: Can reach both  Lives With: Spouse IADL History Education: MBA IADL Comments: reports that she splits  tasks with husband Prior Function Level of Independence: Independent with gait, Independent with transfers, Independent with basic ADLs  Able to Take Stairs?: Yes Driving: Yes Vocation: Full time employment Vision/Perception  Vision- History Baseline Vision/History: Wears glasses Wears Glasses: At all times Patient Visual Report: No change from baseline  Cognition Overall Cognitive Status: Impaired/Different from baseline Arousal/Alertness: Awake/alert Orientation Level: Person;Place;Situation Person: Oriented Place: Oriented Situation: Oriented Year: 2017 Month: February Day of Week: Other (Comment) (wed) Memory: Impaired Memory Impairment: Storage deficit;Retrieval deficit;Decreased recall of new information;Decreased short term memory Decreased Short Term Memory: Verbal basic Immediate Memory Recall: Sock;Blue;Bed Memory Recall: Sock;Blue;Bed Memory Recall Sock: With Cue Memory Recall Blue: With Cue Memory Recall Bed: With Cue Attention: Sustained Sustained Attention: Impaired Sustained Attention Impairment: Verbal basic Awareness: Impaired Awareness Impairment: Emergent impairment Problem Solving: Impaired Problem Solving Impairment: Functional complex Executive Function: Self Monitoring;Self Correcting Self Monitoring: Impaired Self Monitoring Impairment: Functional complex Self Correcting: Impaired Self Correcting Impairment: Functional complex Behaviors: Poor frustration tolerance Safety/Judgment: Appears intact Sensation Sensation Light Touch: Appears Intact Stereognosis: Appears Intact Hot/Cold: Appears Intact Proprioception: Appears Intact Coordination Gross Motor Movements are Fluid and Coordinated: Yes Motor  Motor Motor: Hemiplegia Motor - Skilled Clinical Observations: Slightly decreased  strength LLE/UE Mobility  Bed Mobility Bed Mobility: Supine to Sit Supine to Sit: 4: Min assist Sit to Supine: 5: Supervision  Trunk/Postural Assessment  Cervical Assessment Cervical Assessment: Within Functional Limits Thoracic Assessment Thoracic Assessment: Exceptions to [facility] (kyphotic) Lumbar Assessment Lumbar Assessment: Within Functional Limits Postural Control Postural Control: Within Functional Limits  Balance Balance Balance Assessed: Yes Dynamic Standing Balance Dynamic Standing - Balance Support: No upper extremity supported;During functional activity Dynamic Standing - Level of Assistance: 4: Min assist Dynamic Standing - Balance Activities: Lateral lean/weight shifting;Forward lean/weight shifting;Reaching for objects Extremity/Trunk Assessment RUE Assessment RUE Assessment: Within Functional Limits LUE Assessment LUE Assessment: Exceptions to [facility] (pacemaker precautions)   See Function Navigator for Current Functional Status.   Refer to Care Plan for Long Term Goals  Recommendations for other services: None  Discharge Criteria: Patient will be discharged from OT if patient refuses treatment 3 consecutive times without medical reason, if treatment goals not met, if there is a change in medical status, if patient makes no progress towards goals or if patient is discharged from hospital.  The above assessment, treatment plan, treatment alternatives and goals were discussed and mutually agreed upon: by patient  Phineas Semen 09/06/2015, 5:03 PM

## 2015-09-06 NOTE — IPOC Note (Signed)
Overall Plan of Care [facility]) Patient Details Name: Marie Ramos MRN: [id number] DOB: 23-Oct-1955  Admitting Diagnosis: CARDIAC ARREST [facility] Problems: Active Problems:   Anoxic brain injury ([place])   Abnormality of gait   Acute bilat watershed infarction [facility])   Notalgia   Acute systolic congestive heart failure ([facility])   Benign essential HTN   Acute lower UTI   AKI (acute kidney injury) ([place])     Functional Problem List: Nursing Behavior, Endurance, Medication Management, Nutrition, Pain, Safety, Skin Integrity  PT Balance, Endurance, Motor, Pain, Safety  OT Balance, Endurance, Motor, Pain, Safety  SLP Cognition, Linguistic  TR         Basic ADL's: OT Grooming, Bathing, Dressing, Toileting     Advanced  ADL's: OT Simple Meal Preparation, Laundry     Transfers: PT Bed Mobility, Bed to Chair, Car, Manufacturing systems engineer, Metallurgist: PT Ambulation, Stairs     Additional Impairments: OT None  SLP Communication, Social Cognition expression Problem Solving, Memory, Attention  TR      Anticipated Outcomes Item Anticipated Outcome  Self Feeding n/a  Swallowing      Basic self-care  mod I   Toileting  mod I    Bathroom Transfers mod I - toilet, supervision - shower  Bowel/Bladder  continent of B&B with supervision  Transfers  Mod I   Locomotion  Mod I household distances, S Research officer, political party  Supervision   Pain  <2  Safety/Judgment  Increased safety awareness, call for assistance, no falls or injuries   Therapy Plan: PT Intensity: Minimum of 1-2 x/day ,45 to 90 minutes PT Frequency: 5 out of 7 days PT Duration Estimated Length of Stay: 7 days OT Intensity: Minimum of 1-2 x/day, 45 to 90 minutes OT Frequency: 5 out of 7 days OT Duration/Estimated Length of Stay: 7 days SLP Intensity: Minumum of 1-2 x/day, 30 to 90 minutes SLP Frequency: 3 to 5 out of 7 days SLP  Duration/Estimated Length of Stay: 7 days       Team Interventions: Nursing Interventions Patient/Family Education, Pain Management, Disease Management/Prevention, Medication Management, Skin Care/Wound Management, Psychosocial Support, Discharge Planning  PT interventions Ambulation/gait training, Training and development officer, Cognitive remediation/compensation, Community reintegration, Discharge planning, Disease management/prevention, DME/adaptive equipment instruction, Functional mobility training, Neuromuscular re-education, Pain management, Patient/family education, Psychosocial support, Stair training, Therapeutic Activities, Therapeutic Exercise, UE/LE Strength taining/ROM  OT Interventions Training and development officer, Cognitive remediation/compensation, Academic librarian, Barrister's clerk education, Self Care/advanced ADL retraining, Therapeutic Exercise, UE/LE Coordination activities, UE/LE Strength taining/ROM, Therapeutic Activities, Psychosocial support, Pain management, Functional mobility training, DME/adaptive equipment instruction, Discharge planning  SLP Interventions Cognitive remediation/compensation, Cueing hierarchy, Environmental controls, Functional tasks, Internal/external aids, Medication managment, Patient/family education, Speech/Language facilitation, Therapeutic Activities  TR Interventions    SW/CM Interventions Discharge Planning, Psychosocial Support, Patient/Family Education    Team Discharge Planning: Destination: PT-Home ,OT- Home , SLP-Home Projected Follow-up: PT-24 hour supervision/assistance, Outpatient PT, OT-  Outpatient OT, SLP-24 hour supervision/assistance, Outpatient SLP Projected Equipment Needs: PT-Rolling walker with 5" wheels, To be determined, OT- To be determined, SLP-None recommended by SLP Equipment Details: PT-Pt may have RW at home, OT-  Patient/family involved in discharge planning: PT- Patient,  OT-Patient, SLP-Patient, Family  member/caregiver  MD ELOS: 7-10 days Medical Rehab Prognosis:  Excellent Assessment: 60 y.o. female with history of fall with TBI/splenectomy, bipolar disorder with recent increase in psychiatric medications who was admitted on 08/24/15 after witnessed cardiac arrest. Patient unresponsive  and pulseless and AED placed by school nurse with 2 shocks and CPR initiated. EMS evaluation showed PEA and ROSC after epinephrine--down time 10 minutes. Initial rhythm presumed to be VT. Patient intubated and cardiac cath was negative for CAD. She was treated with hypothermia protocol and arrest felt to be due to QT elongation. 2D echo with asymmetric septal hypertrophic CM without obstruction. Cardiac MRI showed hypertensive rather than HCM with EF 45%. She was extubated without difficulty on 02/23 and Psych consulted for medication adjustment and seroquel and adderral discontinued. Trileptal added to help with mood stabilization and high levels of anxiety managed with prn Xanax. Neurology consulted due to confusion and MRI brain done revealing scattered supra and infratentorial foci of acute ischemia measuring up to 11 mm-- question embolic phenomena and possible component of watershed ischemia. MRA without significant stenosis or aneurysm. Dr. Erlinda Hong recommended ASA and statin for strokes due to SVD v/s embolic due to cardiac cath. Acute systolic dysfunction treated with coreg and ACE titration and ICD to be placed on 02/27. BLE dopplers done revealed left posterior tibial and left peroneal veins DVT therefore patient started on Eliquis. Therapy initiated and patient noted to have impaired cognition with generalized weakness, SOB as well as decreased balance.   See Team Conference Notes for weekly updates to the plan of care

## 2015-09-06 NOTE — Care Management Note (Signed)
[place] Individual Statement of Services  Patient Name:  Marie Ramos  Date:  09/06/2015  Welcome to the [facility].  Our goal is to provide you with an individualized program based on your diagnosis and situation, designed to meet your specific needs.  With this comprehensive rehabilitation program, you will be expected to participate in at least 3 hours of rehabilitation therapies Monday-Friday, with modified therapy programming on the weekends.  Your rehabilitation program will include the following services:  Physical Therapy (PT), Occupational Therapy (OT), Speech Therapy (ST), 24 hour per day rehabilitation nursing, Therapeutic Recreaction (TR), Neuropsychology, Case Management (Social Worker), Rehabilitation Medicine, Nutrition Services and Pharmacy Services  Weekly team conferences will be held on Wednesdays to discuss your progress.  Your Social Worker will talk with you frequently to get your input and to update you on team discussions.  Team conferences with you and your family in attendance may also be held.  Expected length of stay: 10-13 days  Overall anticipated outcome: supervision/ minimal assistance  Depending on your progress and recovery, your program may change. Your Social Worker will coordinate services and will keep you informed of any changes. Your Social Worker's name and contact numbers are listed  below.  The following services may also be recommended but are not provided by the [facility] will be made to provide these services after discharge if needed.  Arrangements include referral to agencies that provide these services.  Your insurance has been verified to be:  [employer] Your primary doctor is:  Melinda Crutch  Pertinent information will be  shared with your doctor and your insurance company.  Social Worker:  [place], [place] or ([phone]   Information discussed with and copy given to patient by: Lennart Pall, 09/06/2015, 11:15 AM

## 2015-09-07 ENCOUNTER — Inpatient Hospital Stay (HOSPITAL_COMMUNITY): Payer: BLUE CROSS/BLUE SHIELD | Admitting: Speech Pathology

## 2015-09-07 ENCOUNTER — Inpatient Hospital Stay (HOSPITAL_COMMUNITY): Payer: BLUE CROSS/BLUE SHIELD | Admitting: Occupational Therapy

## 2015-09-07 ENCOUNTER — Inpatient Hospital Stay (HOSPITAL_COMMUNITY): Payer: Self-pay | Admitting: Physical Therapy

## 2015-09-07 MED ORDER — PROSIGHT PO TABS
1.0000 | ORAL_TABLET | Freq: Every day | ORAL | Status: DC
  Administered 2015-09-08 – 2015-09-14 (×7): 1 via ORAL
  Filled 2015-09-07 (×7): qty 1

## 2015-09-07 MED ORDER — LISINOPRIL 5 MG PO TABS
5.0000 mg | ORAL_TABLET | Freq: Every day | ORAL | Status: DC
  Administered 2015-09-07 – 2015-09-14 (×8): 5 mg via ORAL
  Filled 2015-09-07 (×8): qty 1

## 2015-09-07 NOTE — Progress Notes (Signed)
Speech Language Pathology Daily Session Note  Patient Details  Name: Marie Ramos MRN: [id number] Date of Birth: Sep 09, 1955  Today's Date: 09/07/2015 SLP Individual Time: 1300-1333 SLP Individual Time Calculation (min): 33 min  Short Term Goals: Week 1: SLP Short Term Goal 1 (Week 1): short term goals = long term goals due to length of stay  Skilled Therapeutic Interventions: Pt was seen to address cognitive goals. Pt was sitting upright and fully engaged in session. Pt required consistent verbal cueing to redirect to attend to task. Pt was asked to write down ingredients for a familiar recipe. Pt was able to recall 60% of ingredients for a basic recipe. Organization of information was somewhat limited and pt frequently included directions in the ingredient list. Mod A of verbal cueing and questions for clarification required for a cohesive list. Pt was O x 4 this daet and verbalized good awareness of need for use of call light for safety.   Function:   Cognition Comprehension Comprehension assist level: Follows basic conversation/direction with extra time/assistive device  Expression   Expression assist level: Expresses basic 90% of the time/requires cueing < 10% of the time.  Social Interaction Social Interaction assist level: Interacts appropriately 90% of the time - Needs monitoring or encouragement for participation or interaction.  Problem Solving Problem solving assist level: Solves basic problems with no assist  Memory Memory assist level: Recognizes or recalls 75 - 89% of the time/requires cueing 10 - 24% of the time    Pain Pain Assessment Pain Assessment: No/denies pain   Therapy/Group: Individual Therapy  Vinetta Bergamo 09/07/2015, 1:43 PM

## 2015-09-07 NOTE — Progress Notes (Signed)
[facility] PHYSICAL MEDICINE & REHABILITATION     PROGRESS NOTE  Subjective/Complaints:  Pt sitting up in her chair in good spirits.  She slept well overnight and had a good 1st day in therapies yesterday.  She is looking forward to resuming again today.   ROS: Denies CP, SOB, N/V/D.  Objective: Vital Signs: Blood pressure 157/62, pulse 73, temperature 98 F (36.7 C), temperature source Oral, resp. rate 18, height 5\' 2"  (1.575 m), weight 103.057 kg (227 lb 3.2 oz), SpO2 95 %. No results found.  Recent Labs  09/05/15 0347 09/06/15 0555  WBC 14.6* 11.9*  HGB 11.5* 11.3*  HCT 37.1 36.6  PLT 528* 544*    Recent Labs  09/05/15 0347 09/05/15 1003 09/06/15 0555  NA 139  --  140  K 5.9* 4.4 4.5  CL 102  --  103  GLUCOSE 71  --  90  BUN 27*  --  23*  CREATININE 0.94  --  0.98  CALCIUM 9.2  --  9.3   CBG (last 3)   Recent Labs  09/05/15 0607 09/05/15 1153 09/05/15 1638  GLUCAP 74 49* 68    Wt Readings from Last 3 Encounters:  09/05/15 103.057 kg (227 lb 3.2 oz)  09/04/15 100.245 kg (221 lb)    Physical Exam:  BP 157/62 mmHg  Pulse 73  Temp(Src) 98 F (36.7 C) (Oral)  Resp 18  Ht 5\' 2"  (1.575 m)  Wt 103.057 kg (227 lb 3.2 oz)  BMI 41.54 kg/m2  SpO2 95% Constitutional: She appears well-developed and well-nourished.  NAD. Vital signs reviewed.  HENT: Normocephalic and atraumatic.  Eyes: Conjunctivae and EOM are normal.  Cardiovascular: Normal rate and regular rhythm.No murmur heard. Respiratory: Effort normal and breath sounds normal.  GI: Soft. Bowel sounds are normal. She exhibits no distension. There is no tenderness.  Musculoskeletal: She exhibits no edema or tenderness.  Neurological: She is alert and oriented.  Able to follow simple commands.  Motor:  RUE: 4+/5 proximal to distal LUE: 4+/5 proximal to distal LLE: Hip flexion 4+/5, knee extension 4+/5, ankle dorsi/plantarflexion 5/5  RLE: Hip flexion 4+/5, knee extension 4+/5, ankle  dorsi/plantarflexion 5/5  Skin: Skin is warm and dry. No rash noted.  Psychiatric: She has a normal mood and affect. Her behavior is normal.   Assessment/Plan: 1. Functional deficits secondary to Cardiac arrest with hypoxic encephalopathy and embolic/watershed stroke which require 3+ hours per day of interdisciplinary therapy in a comprehensive inpatient rehab setting. Physiatrist is providing close team supervision and 24 hour management of active medical problems listed below. Physiatrist and rehab team continue to assess barriers to discharge/monitor patient progress toward functional and medical goals.  Function:  Bathing Bathing position   Position: Shower  Bathing parts Body parts bathed by patient: Right arm, Left arm, Chest, Abdomen, Front perineal area, Buttocks, Right upper leg, Left upper leg, Right lower leg, Left lower leg, Back    Bathing assist Assist Level: Touching or steadying assistance(Pt > 75%)      Upper Body Dressing/Undressing Upper body dressing   What is the patient wearing?: Pull over shirt/dress     Pull over shirt/dress - Perfomed by patient: Thread/unthread right sleeve, Pull shirt over trunk, Thread/unthread left sleeve, Put head through opening          Upper body assist Assist Level: Set up   Set up : To obtain clothing/put away  Lower Body Dressing/Undressing Lower body dressing   What is the patient wearing?: Underwear, Pants,  Non-skid slipper socks Underwear - Performed by patient: Thread/unthread right underwear leg, Thread/unthread left underwear leg, Pull underwear up/down   Pants- Performed by patient: Thread/unthread right pants leg, Thread/unthread left pants leg, Pull pants up/down   Non-skid slipper socks- Performed by patient: Don/doff right sock, Don/doff left sock                    Lower body assist Assist for lower body dressing: Touching or steadying assistance (Pt > 75%)      Toileting Toileting   Toileting  steps completed by patient: Adjust clothing prior to toileting, Performs perineal hygiene, Adjust clothing after toileting Toileting steps completed by helper: Performs perineal hygiene Toileting Assistive Devices: Grab bar or rail  Toileting assist Assist level: Touching or steadying assistance (Pt.75%)   Transfers Chair/bed transfer   Chair/bed transfer method: Ambulatory Chair/bed transfer assist level: Touching or steadying assistance (Pt > 75%) Chair/bed transfer assistive device: Walker, Air cabin crew     Max distance: 100 Assist level: Touching or steadying assistance (Pt > 75%)   Wheelchair          Cognition Comprehension Comprehension assist level: Follows basic conversation/direction with extra time/assistive device  Expression Expression assist level: Expresses basic 90% of the time/requires cueing < 10% of the time.  Social Interaction Social Interaction assist level: Interacts appropriately with others with medication or extra time (anti-anxiety, antidepressant).  Problem Solving Problem solving assist level: Solves basic problems with no assist  Memory Memory assist level: Recognizes or recalls 75 - 89% of the time/requires cueing 10 - 24% of the time    Medical Problem List and Plan: 1. Weakness, abnormality of gait, and cognitive dysfunction secondary to Cardiac arrest with hypoxic encephalopathy and embolic/watershed stroke.  Cont CIR 2. LLE DVT/Anticoagulation: Pharmaceutical: Other (comment)-Eliquis 3. Pain Management: Tylenol prn effective for back/chest soreness.  4. Mood: Team to provide ego support. LCSW to follow for evaluation and support.  5. Neuropsych: This patient is not fully capable of making decisions on her own behalf. 6. Skin/Wound Care: Monitor wound daily. Routine pressure relief measures.  7. Fluids/Electrolytes/Nutrition: Monitor I/O.   BMP within acceptable range on 3/2 8. VT/PEA arrest: NO DRIVING FOR 6  MONTHS POST ARREST. ICD in place.  9. Acute systolic dysfunction: Likely due to hypertensive CM--on Coreg and Ace.  10. HTN: Monitor BP. Continue coreg   Will start Lisinopril 5.  11. Supra and infratentorial infarcts could be embolic due to cardiac cath v/s small vessel disease:  12. Bipolar disease: Mood stable on Effexor XR with xanax prn.  13. Leucocytosis: Likely due to E coli UTI.   Started on ceftriaxone and changed to ceftin D # 4  Improving: 11.9 on 3/2 14. Acute renal insufficiency: push po fluids.   LOS (Days) 2 A FACE TO FACE EVALUATION WAS PERFORMED  Ankit [name] 09/07/2015 9:05 AM

## 2015-09-07 NOTE — Progress Notes (Signed)
Physical Therapy Session Note  Patient Details  Name: Marie Ramos MRN: [id number] Date of Birth: 11-17-1955  Today's Date: 09/07/2015 PT Individual Time: [phone] and 1430-1526 PT Individual Time Calculation (min): 54 min and 56 min   Short Term Goals: Week 1:  PT Short Term Goal 1 (Week 1): STG=LTG due to LOS, Mod I transfers and household gait, S community  Skilled Therapeutic Interventions/Progress Updates:    Session 1: Pt received finishing breakfast in recliner and agreeable to therapy.  Session focus on cognition, dynamic balance, and mobility.  Sit<>stand throughout session from various surface heights with supervision with and without RW.  Pt able to amb in room for self care with RW and close supervision.  Dynamic standing balance at sink for oral care and handwashing following toileting.  Repeated sit<>stand for LB dressing with supervision.  UB dressing with mod I.  Pt amb to therapy gym supervision with RW.  BERG administered and patient demonstrates significant fall risk as noted by score of 39/56 on Berg Balance Scale.  Pt demonstrates increased challenge in situations of single limb support and weight shifting.  PT provided pt education on results and interpretation and pt verbalized understanding.  Pt amb back to room at end of session in same manner as above and positioned in recliner with call bell in reach and needs met.   Session 2: Pt received resting in recliner and agreeable to therapy session.  Pt requesting to do laundry.  Pt amb from room to laundry room with 1 seated rest break and close supervision.  Washer in use, so amb back to therapy gym supervision.  PT instructed in NMR activity for single limb support and weight shift 3x10 toe taps on 6" step R and L.  Kinetron for NMR, strengthening, and endurance 5 min +5 min with short rest break in between trials.  Pt amb back to laundry room with supervision and able to load and start washer with supervision and verbal  cues for use of machine.  Pt amb back to room supervision with one seated rest break and pt reporting increased pain in surgical site.  RN aware and in to give meds as PT exiting.  Pt in chair with call bell in reach and needs met.   Therapy Documentation Precautions:  Precautions Precautions: Fall Precaution Comments: pacemaker precautions Restrictions Weight Bearing Restrictions: No  Balance: Balance Balance Assessed: Yes Standardized Balance Assessment Standardized Balance Assessment: Berg Balance Test Berg Balance Test Sit to Stand: Able to stand without using hands and stabilize independently Standing Unsupported: Able to stand 2 minutes with supervision Sitting with Back Unsupported but Feet Supported on Floor or Stool: Able to sit safely and securely 2 minutes Stand to Sit: Controls descent by using hands Transfers: Able to transfer safely, definite need of hands Standing Unsupported with Eyes Closed: Able to stand 10 seconds with supervision Standing Ubsupported with Feet Together: Able to place feet together independently and stand for 1 minute with supervision From Standing, Reach Forward with Outstretched Arm: Can reach forward >12 cm safely (5") From Standing Position, Pick up Object from Floor: Able to pick up shoe, needs supervision From Standing Position, Turn to Look Behind Over each Shoulder: Turn sideways only but maintains balance Turn 360 Degrees: Able to turn 360 degrees safely but slowly Standing Unsupported, Alternately Place Feet on Step/Stool: Able to complete 4 steps without aid or supervision Standing Unsupported, One Foot in Front: Able to plae foot ahead of the other independently and hold  30 seconds Standing on One Leg: Tries to lift leg/unable to hold 3 seconds but remains standing independently Total Score: 39   See Function Navigator for Current Functional Status.   Therapy/Group: Individual Therapy  Marie Ramos 09/07/2015, 9:45 AM

## 2015-09-07 NOTE — Progress Notes (Signed)
Occupational Therapy Session Note  Patient Details  Name: Marie Ramos MRN: [id number] Date of Birth: 12/26/1955  Today's Date: 09/07/2015 OT Individual Time: 1057-1220 OT Individual Time Calculation (min): 83 min    Skilled Therapeutic Interventions/Progress Updates: Patient completed fuctional mobility Hand held assist/ close supervision and toilet and shower transfer via grab bars and tub transfer bench.   She was able to complete bathing and dressing with focus on reaching around body girth while maintaining pacemaker precautions.   As well focus was on problem solving how to loosen the lower laces of her shoes to don over high arch and slightly swollen feet.    She needed verbal and physical assist to problem solve loosening the shoe lace.    As well, she worked on [employer] and problem solving as she showed and discussed her pictures and answered questions.      Therapy Documentation Precautions:  Precautions Precautions: Fall Precaution Comments: pacemaker precautions Restrictions Weight Bearing Restrictions: No  Pain: denied  See Function Navigator for Current Functional Status.   Therapy/Group: Individual Therapy  Alfredia Ferguson [facility] 09/07/2015, 1:08 PM

## 2015-09-08 ENCOUNTER — Inpatient Hospital Stay (HOSPITAL_COMMUNITY): Payer: BLUE CROSS/BLUE SHIELD | Admitting: Speech Pathology

## 2015-09-08 MED ORDER — SENNOSIDES-DOCUSATE SODIUM 8.6-50 MG PO TABS
2.0000 | ORAL_TABLET | Freq: Two times a day (BID) | ORAL | Status: DC
Start: 2015-09-08 — End: 2015-09-11
  Administered 2015-09-08 – 2015-09-10 (×4): 2 via ORAL
  Filled 2015-09-08 (×6): qty 2

## 2015-09-08 NOTE — Progress Notes (Signed)
Speech Language Pathology Daily Session Note  Patient Details  Name: Marie Ramos MRN: [id number] Date of Birth: 1956/06/12  Today's Date: 09/08/2015 SLP Individual Time: 1430-1500 SLP Individual Time Calculation (min): 30 min  Short Term Goals: Week 1: SLP Short Term Goal 1 (Week 1): short term goals = long term goals due to length of stay  Skilled Therapeutic Interventions: Skilled treatment session focused on cognitive goals. SLP facilitated session by providing supervision question cues and extra time for redirection from a conversation to a functional task. Patient was asked to organize the steps of a familiar recipe, however, patient regenerated a list of ingredients and required Min-Mod A question cues for thoroughness with sequencing the directions to make the recipe. At end of session, patient asking appropriate questions in regards to her current cognitive function and progress. Patient left upright in wheelchair with all needs within reach. Continue with current plan of care.   Function:   Cognition Comprehension Comprehension assist level: Follows basic conversation/direction with extra time/assistive device  Expression   Expression assist level: Expresses basic 90% of the time/requires cueing < 10% of the time.  Social Interaction Social Interaction assist level: Interacts appropriately 90% of the time - Needs monitoring or encouragement for participation or interaction.  Problem Solving Problem solving assist level: Solves basic 90% of the time/requires cueing < 10% of the time  Memory Memory assist level: Recognizes or recalls 75 - 89% of the time/requires cueing 10 - 24% of the time    Pain No/Denies Pain   Therapy/Group: Individual Therapy  Yusef Lamp 09/08/2015, 3:38 PM

## 2015-09-08 NOTE — Progress Notes (Signed)
[place] PHYSICAL MEDICINE & REHABILITATION     PROGRESS NOTE  Subjective/Complaints:  No BM x several days, no abd pain or nausea, eating well Voiding cont  ROS: Denies CP, SOB, N/V/D.  Objective: Vital Signs: Blood pressure 120/64, pulse 80, temperature 98.2 F (36.8 C), temperature source Oral, resp. rate 18, height 5\' 2"  (1.575 m), weight 103.057 kg (227 lb 3.2 oz), SpO2 98 %. No results found.  Recent Labs  09/06/15 0555  WBC 11.9*  HGB 11.3*  HCT 36.6  PLT 544*    Recent Labs  09/05/15 1003 09/06/15 0555  NA  --  140  K 4.4 4.5  CL  --  103  GLUCOSE  --  90  BUN  --  23*  CREATININE  --  0.98  CALCIUM  --  9.3   CBG (last 3)   Recent Labs  09/05/15 1153 09/05/15 1638  GLUCAP 49* 68    Wt Readings from Last 3 Encounters:  09/05/15 103.057 kg (227 lb 3.2 oz)  09/04/15 100.245 kg (221 lb)    Physical Exam:  BP 120/64 mmHg  Pulse 80  Temp(Src) 98.2 F (36.8 C) (Oral)  Resp 18  Ht 5\' 2"  (1.575 m)  Wt 103.057 kg (227 lb 3.2 oz)  BMI 41.54 kg/m2  SpO2 98% Constitutional: She appears well-developed and well-nourished.  NAD. Vital signs reviewed.  HENT: Normocephalic and atraumatic.  Eyes: Conjunctivae and EOM are normal.  Cardiovascular: Normal rate and regular rhythm.No murmur heard. Respiratory: Effort normal and breath sounds normal.  GI: Soft. Bowel sounds are normal. She exhibits no distension. There is no tenderness.  Musculoskeletal: She exhibits no edema or tenderness.  Neurological: She is alert and oriented.  Able to follow simple commands.  Motor:  RUE: 4+/5 proximal to distal LUE: 4+/5 proximal to distal LLE: Hip flexion 4+/5, knee extension 4+/5, ankle dorsi/plantarflexion 5/5  RLE: Hip flexion 4+/5, knee extension 4+/5, ankle dorsi/plantarflexion 5/5  Skin: Skin is warm and dry. No rash noted.  Psychiatric: She has a normal mood and affect. Her behavior is normal.   Assessment/Plan: 1. Functional deficits secondary to  Cardiac arrest with hypoxic encephalopathy and embolic/watershed stroke which require 3+ hours per day of interdisciplinary therapy in a comprehensive inpatient rehab setting. Physiatrist is providing close team supervision and 24 hour management of active medical problems listed below. Physiatrist and rehab team continue to assess barriers to discharge/monitor patient progress toward functional and medical goals.  Function:  Bathing Bathing position   Position: Shower  Bathing parts Body parts bathed by patient: Left arm, Chest, Abdomen, Front perineal area, Buttocks, Right upper leg, Left upper leg, Right lower leg, Left lower leg, Back (long towel see sawed between legs @periarea  and feet helpful for independence) Body parts bathed by helper: Right arm  Bathing assist Assist Level: Touching or steadying assistance(Pt > 75%)      Upper Body Dressing/Undressing Upper body dressing   What is the patient wearing?: Pull over shirt/dress     Pull over shirt/dress - Perfomed by patient: Thread/unthread right sleeve, Pull shirt over trunk, Thread/unthread left sleeve, Put head through opening          Upper body assist Assist Level: More than reasonable time   Set up : To obtain clothing/put away  Lower Body Dressing/Undressing Lower body dressing   What is the patient wearing?: Underwear, Pants Underwear - Performed by patient: Thread/unthread right underwear leg, Thread/unthread left underwear leg, Pull underwear up/down   Pants- Performed  by patient: Thread/unthread right pants leg, Thread/unthread left pants leg, Pull pants up/down   Non-skid slipper socks- Performed by patient: Don/doff right sock, Don/doff left sock                    Lower body assist Assist for lower body dressing: Supervision or verbal cues      Toileting Toileting   Toileting steps completed by patient: Adjust clothing prior to toileting, Performs perineal hygiene, Adjust clothing after  toileting Toileting steps completed by helper: Performs perineal hygiene Toileting Assistive Devices: Grab bar or rail  Toileting assist Assist level: Supervision or verbal cues   Transfers Chair/bed transfer   Chair/bed transfer method: Ambulatory Chair/bed transfer assist level: Supervision or verbal cues Chair/bed transfer assistive device: Environmental consultant, Air cabin crew     Max distance: 200 Assist level: Supervision or verbal cues   Wheelchair          Cognition Comprehension Comprehension assist level: Follows basic conversation/direction with extra time/assistive device  Expression Expression assist level: Expresses basic 90% of the time/requires cueing < 10% of the time.  Social Interaction Social Interaction assist level: Interacts appropriately 90% of the time - Needs monitoring or encouragement for participation or interaction.  Problem Solving Problem solving assist level: Solves basic problems with no assist  Memory Memory assist level: Recognizes or recalls 75 - 89% of the time/requires cueing 10 - 24% of the time    Medical Problem List and Plan: 1. Weakness, abnormality of gait, and cognitive dysfunction secondary to Cardiac arrest with hypoxic encephalopathy and embolic/watershed stroke.  Cont CIR 2. LLE DVT/Anticoagulation: Pharmaceutical: Other (comment)-Eliquis 3. Pain Management: Tylenol prn effective for back/chest soreness.  4. Mood: Team to provide ego support. LCSW to follow for evaluation and support.  5. Neuropsych: This patient is not fully capable of making decisions on her own behalf. 6. Skin/Wound Care: Monitor wound daily. Routine pressure relief measures.  7. Fluids/Electrolytes/Nutrition: Monitor I/O.   BMP within acceptable range on 3/2 8. VT/PEA arrest: NO DRIVING FOR 6 MONTHS POST ARREST. ICD in place.  9. Acute systolic dysfunction: Likely due to hypertensive CM--on Coreg and Ace.  10. HTN: Monitor BP. Continue  coreg    Lisinopril 5mg .BP 120/64  11. Supra and infratentorial infarcts could be embolic due to cardiac cath v/s small vessel disease:  12. Bipolar disease: Mood stable on Effexor XR with xanax prn.  13. Leucocytosis:due to E coli UTI. Afebrile Started on ceftriaxone and changed to ceftin D # 4  Improving: 11.9 on 3/2 14. Acute renal insufficiency: push po fluids.  15.  Constipation adjust laxatives LOS (Days) 3 A FACE TO FACE EVALUATION WAS PERFORMED  KIRSTEINS,ANDREW E 09/08/2015 7:09 AM

## 2015-09-09 ENCOUNTER — Inpatient Hospital Stay (HOSPITAL_COMMUNITY): Payer: Self-pay | Admitting: Physical Therapy

## 2015-09-09 ENCOUNTER — Inpatient Hospital Stay (HOSPITAL_COMMUNITY): Payer: BLUE CROSS/BLUE SHIELD | Admitting: Occupational Therapy

## 2015-09-09 MED ORDER — TRAMADOL HCL 50 MG PO TABS
50.0000 mg | ORAL_TABLET | Freq: Four times a day (QID) | ORAL | Status: DC | PRN
  Administered 2015-09-09 – 2015-09-14 (×13): 50 mg via ORAL
  Filled 2015-09-09 (×14): qty 1

## 2015-09-09 NOTE — Progress Notes (Signed)
Occupational Therapy Session Note  Patient Details  Name: Marie Ramos MRN: [id number] Date of Birth: 1955/10/10  Today's Date: 09/09/2015 OT Individual Time: 0930-1030 and 1300-1345 OT Individual Time Calculation (min): 60 min and 45 min   Short Term Goals: Week 1:  OT Short Term Goal 1 (Week 1): STGs=LTGs secondary to estimated short LOS  Skilled Therapeutic Interventions/Progress Updates:    1) ADL retraining with focus on functional mobility, standing tolerance, and functional transfers.  Pt gathered all clothing prior to bathing with supervision during sidestepping around furniture and stooping to retreive clothes from [employer].  Pt completed bathing at sit > stand level in room shower from tub bench with use of grab bar for steadying when standing.  Dressing completed in standing with pt able to thread underwear and shorts in standing with UE support on sink counter.  Utilized RW to prop foot on side support to increase ability to reach feet to don socks and shoes.  Ambulated to ADL apt with RW and close supervision to complete tub/shower transfer.  Pt required min assist with stepping over tub ledge due to height of tub, plan to attempt in other tub during PM session.  Recommend grab bar and seat in shower for safety and energy conservation.  2) Therapeutic activity with focus on functional transfers and education with pt's husband.  Completed toilet transfers with RW and supervision, educated pt's husband and body positioning and close supervision as well as cues for pt's hand placement with sit > stand to increase safety.  Pt's husband return demonstrated safety with mobility and transfers, therefore checked off to complete toilet transfers in room with RW.  Ambulated to ADL tubroom to which pt and husband report tub ledge still too high, therefore simulated with therapy step on it's side to simulate stepping over ledge.  Recommend grab bar and shower seat to which pt reports they used to  have one and husband to look for it.  Engaged in alternating weight shift in upright standing with focus on motor control with LLE with cone taps to increase control.  Progressed to single UE support on RW during toe taps with min assist for standing balance.  Returned to room without RW with contact guard and pt's gait more guarded.  Therapy Documentation Precautions:  Precautions Precautions: Fall Precaution Comments: pacemaker precautions Restrictions Weight Bearing Restrictions: No General:   Vital Signs: Therapy Vitals Temp: 97.7 F (36.5 C) Temp Source: Oral Pulse Rate: 79 Resp: 18 BP: 139/67 mmHg Patient Position (if appropriate): Sitting Oxygen Therapy SpO2: 100 % O2 Device: Not Delivered Pain: Pain Assessment Pain Assessment: 0-10 Pain Score: 3   See Function Navigator for Current Functional Status.   Therapy/Group: Individual Therapy  Simonne Come 09/09/2015, 12:52 PM

## 2015-09-09 NOTE — Progress Notes (Signed)
[facility] PHYSICAL MEDICINE & REHABILITATION     PROGRESS NOTE  Subjective/Complaints:   Voiding cont, finally had a BM, doesn't want to take laxative but she's only had one BM in the last several days  ROS: Denies CP, SOB, N/V/D.  Objective: Vital Signs: Blood pressure 110/75, pulse 80, temperature 97.7 F (36.5 C), temperature source Oral, resp. rate 18, height 5\' 2"  (1.575 m), weight 103.057 kg (227 lb 3.2 oz), SpO2 94 %. No results found. No results for input(s): WBC, HGB, HCT, PLT in the last 72 hours. No results for input(s): NA, K, CL, GLUCOSE, BUN, CREATININE, CALCIUM in the last 72 hours.  Invalid input(s): CO CBG (last 3)  No results for input(s): GLUCAP in the last 72 hours.  Wt Readings from Last 3 Encounters:  09/05/15 103.057 kg (227 lb 3.2 oz)  09/04/15 100.245 kg (221 lb)    Physical Exam:  BP 110/75 mmHg  Pulse 80  Temp(Src) 97.7 F (36.5 C) (Oral)  Resp 18  Ht 5\' 2"  (1.575 m)  Wt 103.057 kg (227 lb 3.2 oz)  BMI 41.54 kg/m2  SpO2 94% Constitutional: She appears well-developed and well-nourished.  NAD. Vital signs reviewed.  HENT: Normocephalic and atraumatic.  Eyes: Conjunctivae and EOM are normal.  Cardiovascular: Normal rate and regular rhythm.No murmur heard. Respiratory: Effort normal and breath sounds normal.  GI: Soft. Bowel sounds are normal. She exhibits no distension. There is no tenderness.  Musculoskeletal: She exhibits no edema or tenderness.  Neurological: She is alert and oriented.  Able to follow simple commands.  Motor:  RUE: 4+/5 proximal to distal LUE: 4+/5 proximal to distal LLE: Hip flexion 4+/5, knee extension 4+/5, ankle dorsi/plantarflexion 5/5  RLE: Hip flexion 4+/5, knee extension 4+/5, ankle dorsi/plantarflexion 5/5  Skin: Skin is warm and dry. No rash noted.  Psychiatric: She has a normal mood and affect. Her behavior is normal.   Assessment/Plan: 1. Functional deficits secondary to Cardiac arrest with hypoxic  encephalopathy and embolic/watershed stroke which require 3+ hours per day of interdisciplinary therapy in a comprehensive inpatient rehab setting. Physiatrist is providing close team supervision and 24 hour management of active medical problems listed below. Physiatrist and rehab team continue to assess barriers to discharge/monitor patient progress toward functional and medical goals.  Function:  Bathing Bathing position   Position: Shower  Bathing parts Body parts bathed by patient: Left arm, Chest, Abdomen, Front perineal area, Buttocks, Right upper leg, Left upper leg, Right lower leg, Left lower leg, Back (long towel see sawed between legs @periarea  and feet helpful for independence) Body parts bathed by helper: Right arm  Bathing assist Assist Level: Touching or steadying assistance(Pt > 75%)      Upper Body Dressing/Undressing Upper body dressing   What is the patient wearing?: Pull over shirt/dress     Pull over shirt/dress - Perfomed by patient: Thread/unthread right sleeve, Pull shirt over trunk, Thread/unthread left sleeve, Put head through opening          Upper body assist Assist Level: More than reasonable time   Set up : To obtain clothing/put away  Lower Body Dressing/Undressing Lower body dressing   What is the patient wearing?: Underwear, Pants Underwear - Performed by patient: Thread/unthread right underwear leg, Thread/unthread left underwear leg, Pull underwear up/down   Pants- Performed by patient: Thread/unthread right pants leg, Thread/unthread left pants leg, Pull pants up/down   Non-skid slipper socks- Performed by patient: Don/doff right sock, Don/doff left sock  Lower body assist Assist for lower body dressing: Supervision or verbal cues      Toileting Toileting   Toileting steps completed by patient: Adjust clothing prior to toileting, Performs perineal hygiene, Adjust clothing after toileting Toileting steps completed  by helper: Performs perineal hygiene Toileting Assistive Devices: Grab bar or rail  Toileting assist Assist level: Supervision or verbal cues   Transfers Chair/bed transfer   Chair/bed transfer method: Ambulatory Chair/bed transfer assist level: Supervision or verbal cues Chair/bed transfer assistive device: Walker, Air cabin crew     Max distance: 200 Assist level: Supervision or verbal cues   Wheelchair          Cognition Comprehension Comprehension assist level: Follows complex conversation/direction with extra time/assistive device  Expression Expression assist level: Expresses complex 90% of the time/cues < 10% of the time  Social Interaction Social Interaction assist level: Interacts appropriately 90% of the time - Needs monitoring or encouragement for participation or interaction.  Problem Solving Problem solving assist level: Solves basic 90% of the time/requires cueing < 10% of the time  Memory Memory assist level: Recognizes or recalls 75 - 89% of the time/requires cueing 10 - 24% of the time    Medical Problem List and Plan: 1. Weakness, abnormality of gait, and cognitive dysfunction secondary to Cardiac arrest with hypoxic encephalopathy and embolic/watershed stroke.  Cont CIR 2. LLE DVT/Anticoagulation: Pharmaceutical: Other (comment)-Eliquis 3. Pain Management: Tylenol prn effective for back/chest soreness.  4. Mood: Team to provide ego support. LCSW to follow for evaluation and support.  5. Neuropsych: This patient is not fully capable of making decisions on her own behalf. 6. Skin/Wound Care: Monitor wound daily. Routine pressure relief measures.  7. Fluids/Electrolytes/Nutrition: Monitor I/O.   BMP within acceptable range on 3/2 Meal intake 100% 8. VT/PEA arrest: NO DRIVING FOR 6 MONTHS POST ARREST. ICD in place.  9. Acute systolic dysfunction: Likely due to hypertensive CM--on Coreg and Ace.  10. HTN: Monitor BP. Continue coreg     Lisinopril 5mg .BP 121/75  11. Supra and infratentorial infarcts could be embolic due to cardiac cath v/s small vessel disease:  12. Bipolar disease: Mood stable on Effexor XR with xanax prn.  13. Leucocytosis:due to E coli UTI. Afebrile Started on ceftriaxone and changed to ceftin D # 6  Improving: 11.9 on 3/2 14. Acute renal insufficiency: push po fluids.  15.  Constipation had cont medium BM yesterday, cont laxatives LOS (Days) 4 A FACE TO FACE EVALUATION WAS PERFORMED  Arlicia Paquette E 09/09/2015 6:15 AM

## 2015-09-09 NOTE — Progress Notes (Addendum)
Physical Therapy Session Note  Patient Details  Name: Marie Ramos MRN: [id number] Date of Birth: 12-17-1955  Today's Date: 09/09/2015 PT Individual Time: 0800-0900 1445-1515 PT Individual Time Calculation (min): 60 min , 30 min  Short Term Goals: Week 1:  PT Short Term Goal 1 (Week 1): STG=LTG due to LOS, Mod I transfers and household gait, S community  Skilled Therapeutic Interventions/Progress Updates:    Pt with L hip drop in gait contributing to gait instability. Pt able to increase L pelvis anterior elevation in sidelying but difficulty carrying it over into gait. Pt would continue to benefit from skilled PT services to increase functional mobility  Therapy Documentation Precautions:  Precautions Precautions: Fall Precaution Comments: pacemaker precautions Restrictions Weight Bearing Restrictions: No Vital Signs: Therapy Vitals Temp: 98.2 F (36.8 C) Temp Source: Oral Pulse Rate: 92 Resp: 18 BP: (!) 144/80 mmHg Oxygen Therapy SpO2: 97 % O2 Device: Not Delivered Pain: Pain Assessment Pain Assessment: 0-10 Pain Score: 5  Pain Location: Chest Pain Orientation: Left Pain Intervention(s):  (graded activity) Mobility:  SBA for transfers with cues for safety Locomotion :   SBA with RW with cues for safety, free gait 50'x2 in am, 20'x4 in pm Other Treatments:  Tx 1: Pt performs static and dynamic sitting and standing balance tasks including weight shifting, grasping, reaching, and LE manipulation. with dual motor tasks within functional context. Sidelying pelvic PNF into L anterior elevation and posterior depression.  Tx 2: Pt educated on rehab plan and safety in mobility. L/S soft tissue stretch in R and L lateral flexion 2x30". Step-up position weight shifts, sitting ER, self ball toss 2x10. Transfers x15 in session. Side steps with ECC lowering on stairs x10.    See Function Navigator for Current Functional Status.   Therapy/Group: Individual Therapy  Monia Pouch 09/09/2015, 8:40 AM

## 2015-09-10 ENCOUNTER — Inpatient Hospital Stay (HOSPITAL_COMMUNITY): Payer: BLUE CROSS/BLUE SHIELD | Admitting: Speech Pathology

## 2015-09-10 ENCOUNTER — Inpatient Hospital Stay (HOSPITAL_COMMUNITY): Payer: BLUE CROSS/BLUE SHIELD | Admitting: Occupational Therapy

## 2015-09-10 ENCOUNTER — Inpatient Hospital Stay (HOSPITAL_COMMUNITY): Payer: Self-pay | Admitting: Physical Therapy

## 2015-09-10 ENCOUNTER — Encounter (HOSPITAL_COMMUNITY): Payer: Self-pay

## 2015-09-10 LAB — CBC WITH DIFFERENTIAL/PLATELET
BASOS PCT: 1 %
Basophils Absolute: 0.1 10*3/uL (ref 0.0–0.1)
EOS ABS: 0.3 10*3/uL (ref 0.0–0.7)
EOS PCT: 3 %
HCT: 35.2 % — ABNORMAL LOW (ref 36.0–46.0)
HEMOGLOBIN: 11.1 g/dL — AB (ref 12.0–15.0)
Lymphocytes Relative: 20 %
Lymphs Abs: 1.7 10*3/uL (ref 0.7–4.0)
MCH: 28.8 pg (ref 26.0–34.0)
MCHC: 31.5 g/dL (ref 30.0–36.0)
MCV: 91.4 fL (ref 78.0–100.0)
MONOS PCT: 8 %
Monocytes Absolute: 0.7 10*3/uL (ref 0.1–1.0)
NEUTROS PCT: 68 %
Neutro Abs: 5.7 10*3/uL (ref 1.7–7.7)
Platelets: 421 10*3/uL — ABNORMAL HIGH (ref 150–400)
RBC: 3.85 MIL/uL — ABNORMAL LOW (ref 3.87–5.11)
RDW: 15 % (ref 11.5–15.5)
WBC: 8.5 10*3/uL (ref 4.0–10.5)

## 2015-09-10 LAB — BASIC METABOLIC PANEL
Anion gap: 10 (ref 5–15)
BUN: 20 mg/dL (ref 6–20)
CALCIUM: 9.5 mg/dL (ref 8.9–10.3)
CO2: 27 mmol/L (ref 22–32)
CREATININE: 0.91 mg/dL (ref 0.44–1.00)
Chloride: 106 mmol/L (ref 101–111)
Glucose, Bld: 84 mg/dL (ref 65–99)
Potassium: 4.6 mmol/L (ref 3.5–5.1)
SODIUM: 143 mmol/L (ref 135–145)

## 2015-09-10 NOTE — Progress Notes (Signed)
Physical Therapy Session Note  Patient Details  Name: Marie Ramos MRN: [id number] Date of Birth: Nov 12, 1955  Today's Date: 09/10/2015 PT Individual Time: 1423-1530 PT Individual Time Calculation (min): 67 min   Short Term Goals: Week 1:  PT Short Term Goal 1 (Week 1): STG=LTG due to LOS, Mod I transfers and household gait, S community  Skilled Therapeutic Interventions/Progress Updates:    Handoff from NT in bathroom.  Supervision for pt to complete toileting and transfer.  Supervision for dynamic standing balance for peri-care and clothing management.  Pt amb to therapy gym and completed 10 min on Nustep at level 4 with 10 second rest at 5 min for overall endurance.  PT instructed pt in balance activity throwing horseshoes on controlled surface with RW, on compliant surface with RW, and with 1 foot stepped on dynadisk (R and L).  Pt amb to horseshoes x2 with RW and x2 without RW to collect from floor.  Supervision for retrieving object from floor.  Pt amb to laundry room and performed dynamic standing balance to load clothes into washer, needing supervision to reach and pick up socks dropped on floor.  Pt amb back to room at end of session with supervision and RW.  Family present in room.  Pt left seated in chair with call bell in reach and needs met.   Therapy Documentation Precautions:  Precautions Precautions: Fall Precaution Comments: pacemaker precautions Restrictions Weight Bearing Restrictions: No   See Function Navigator for Current Functional Status.   Therapy/Group: Individual Therapy  Earnest Conroy Penven-Crew 09/10/2015, 4:48 PM

## 2015-09-10 NOTE — Progress Notes (Signed)
Recreational Therapy Session Note  Patient Details  Name: Marie Ramos MRN: [id number] Date of Birth: Jun 26, 1956 Today's Date: 09/10/2015  Pain: no c/o Skilled Therapeutic Interventions/Progress Updates: Pt participated in animal assisted activity/therapy seated w/c level with supervision.  [place] 09/10/2015, 3:33 PM

## 2015-09-10 NOTE — Progress Notes (Signed)
Speech Language Pathology Daily Session Note  Patient Details  Name: Marie Ramos MRN: [id number] Date of Birth: [birth year]  Today's Date: 09/10/2015 SLP Individual Time: 0900-1000 SLP Individual Time Calculation (min): 60 min  Short Term Goals: Week 1: SLP Short Term Goal 1 (Week 1): short term goals = long term goals due to length of stay  Skilled Therapeutic Interventions: Skilled treatment session focused on addressing cognition goals. SLP facilitated session with discussion regarding orientation; patient Ox4.  SLP also facilitated session with mildly complex verbal problem solving, reasoning and daily calculation tasks, which patient completed with Min verbal cues for working memory strategies.  Patient also benefited from [place] verbal cues to recognize errors, which she was then able to correct with extra time.  Continue with current plan of care.    Function:  Cognition Comprehension Comprehension assist level: Follows complex conversation/direction with extra time/assistive device  Expression   Expression assist level: Expresses basic needs/ideas: With no assist  Social Interaction Social Interaction assist level: Interacts appropriately 90% of the time - Needs monitoring or encouragement for participation or interaction.  Problem Solving Problem solving assist level: Solves basic 90% of the time/requires cueing < 10% of the time  Memory Memory assist level: Recognizes or recalls 75 - 89% of the time/requires cueing 10 - 24% of the time    Pain Pain Assessment Pain Assessment: No/denies pain  Therapy/Group: Individual Therapy  Carmelia Roller., [place] [id number]  [place] 09/10/2015, 2:19 PM

## 2015-09-10 NOTE — Progress Notes (Signed)
Occupational Therapy Session Note  Patient Details  Name: Marie Ramos MRN: [id number] Date of Birth: [birth year]  Today's Date: 09/10/2015 OT Individual Time: 1100-1200 OT Individual Time Calculation (min): 60 min    Short Term Goals: Week 1:  OT Short Term Goal 1 (Week 1): STGs=LTGs secondary to estimated short LOS   Skilled Therapeutic Interventions/Progress Updates:  Upon entering the room, pt seated in recliner chair and requesting to wash in shower. Pt ambulated with RW and supervision to obtain clothing items from dresser. Pt ambulating into bathroom with supervision for shower transfer onto TTB with RW . Pt engaged in bathing from shower with sit <>stand with supervision for safety. Pt returned to recliner chair for rest break secondary to fatigue. Dressing from recliner with close supervision for sit <>stand with clothing management. Pt remained in chair with call bell and all needed items within reach upon exiting the room.   Therapy Documentation Precautions:  Precautions Precautions: Fall Precaution Comments: pacemaker precautions Restrictions Weight Bearing Restrictions: No General:   Vital Signs: Therapy Vitals Pulse Rate: 96 Resp: 18 BP: 133/73 mmHg Patient Position (if appropriate): Sitting Oxygen Therapy SpO2: 97 % O2 Device: Not Delivered   See Function Navigator for Current Functional Status.   Therapy/Group: Individual Therapy  Phineas Semen 09/10/2015, 12:25 PM

## 2015-09-10 NOTE — Progress Notes (Signed)
[facility] PHYSICAL MEDICINE & REHABILITATION     PROGRESS NOTE  Subjective/Complaints:  Pt sitting up in her chair, she has questions about the "hole in her heart"  ROS: Denies CP, SOB, N/V/D.  Objective: Vital Signs: Blood pressure 169/80, pulse 73, temperature 97.7 F (36.5 C), temperature source Oral, resp. rate 18, height 5\' 2"  (1.575 m), weight 103.057 kg (227 lb 3.2 oz), SpO2 96 %. No results found.  Recent Labs  09/10/15 0626  WBC 8.5  HGB 11.1*  HCT 35.2*  PLT 421*    Recent Labs  09/10/15 0626  NA 143  K 4.6  CL 106  GLUCOSE 84  BUN 20  CREATININE 0.91  CALCIUM 9.5   CBG (last 3)  No results for input(s): GLUCAP in the last 72 hours.  Wt Readings from Last 3 Encounters:  09/05/15 103.057 kg (227 lb 3.2 oz)  09/04/15 100.245 kg (221 lb)    Physical Exam:  BP 169/80 mmHg  Pulse 73  Temp(Src) 97.7 F (36.5 C) (Oral)  Resp 18  Ht 5\' 2"  (1.575 m)  Wt 103.057 kg (227 lb 3.2 oz)  BMI 41.54 kg/m2  SpO2 96% Constitutional: She appears well-developed and well-nourished.  NAD. Vital signs reviewed.  HENT: Normocephalic and atraumatic.  Eyes: Conjunctivae and EOM are normal.  Cardiovascular: Normal rate and regular rhythm.No murmur heard. Respiratory: Effort normal and breath sounds normal.  GI: Soft. Bowel sounds are normal. She exhibits no distension. There is no tenderness.  Musculoskeletal: She exhibits no edema or tenderness.  Neurological: She is alert and oriented.  Able to follow simple commands.  Motor:  RUE: 4+/5 proximal to distal LUE: 4+/5 proximal to distal LLE: Hip flexion 4+/5, knee extension 5/5, ankle dorsi/plantarflexion 5/5  RLE: Hip flexion 5/5, knee extension 5/5, ankle dorsi/plantarflexion 5/5  Skin: Skin is warm and dry. No rash noted.  Psychiatric: She has a normal mood and affect. Her behavior is normal.   Assessment/Plan: 1. Functional deficits secondary to Cardiac arrest with hypoxic encephalopathy and  embolic/watershed stroke which require 3+ hours per day of interdisciplinary therapy in a comprehensive inpatient rehab setting. Physiatrist is providing close team supervision and 24 hour management of active medical problems listed below. Physiatrist and rehab team continue to assess barriers to discharge/monitor patient progress toward functional and medical goals.  Function:  Bathing Bathing position   Position: Shower  Bathing parts Body parts bathed by patient: Left arm, Chest, Abdomen, Front perineal area, Buttocks, Right upper leg, Left upper leg, Right lower leg, Left lower leg, Back, Right arm Body parts bathed by helper: Right arm  Bathing assist Assist Level: Supervision or verbal cues      Upper Body Dressing/Undressing Upper body dressing   What is the patient wearing?: Pull over shirt/dress     Pull over shirt/dress - Perfomed by patient: Thread/unthread right sleeve, Pull shirt over trunk, Thread/unthread left sleeve, Put head through opening          Upper body assist Assist Level: More than reasonable time   Set up : To obtain clothing/put away  Lower Body Dressing/Undressing Lower body dressing   What is the patient wearing?: Underwear, Pants, Socks, Shoes Underwear - Performed by patient: Thread/unthread right underwear leg, Thread/unthread left underwear leg, Pull underwear up/down   Pants- Performed by patient: Thread/unthread right pants leg, Thread/unthread left pants leg, Pull pants up/down   Non-skid slipper socks- Performed by patient: Don/doff left sock, Don/doff right sock   Socks - Performed by patient:  Don/doff right sock, Don/doff left sock   Shoes - Performed by patient: Don/doff right shoe, Don/doff left shoe, Fasten right, Fasten left            Lower body assist Assist for lower body dressing: Supervision or verbal cues      Toileting Toileting   Toileting steps completed by patient: Adjust clothing prior to toileting, Performs  perineal hygiene, Adjust clothing after toileting Toileting steps completed by helper: Performs perineal hygiene Toileting Assistive Devices: Grab bar or rail  Toileting assist Assist level: Supervision or verbal cues   Transfers Chair/bed transfer   Chair/bed transfer method: Ambulatory Chair/bed transfer assist level: Supervision or verbal cues Chair/bed transfer assistive device: Environmental consultant, Air cabin crew     Max distance: 200 Assist level: Supervision or verbal cues   Wheelchair          Cognition Comprehension Comprehension assist level: Follows complex conversation/direction with extra time/assistive device  Expression Expression assist level: Expresses complex 90% of the time/cues < 10% of the time  Social Interaction Social Interaction assist level: Interacts appropriately 90% of the time - Needs monitoring or encouragement for participation or interaction.  Problem Solving Problem solving assist level: Solves basic 90% of the time/requires cueing < 10% of the time  Memory Memory assist level: Recognizes or recalls 75 - 89% of the time/requires cueing 10 - 24% of the time    Medical Problem List and Plan: 1. Weakness, abnormality of gait, and cognitive dysfunction secondary to Cardiac arrest with hypoxic encephalopathy and embolic/watershed stroke.  Cont CIR 2. LLE DVT/Anticoagulation: Pharmaceutical: Other (comment)-Eliquis 3. Pain Management: Tylenol prn effective for back/chest soreness.  4. Mood: Team to provide ego support. LCSW to follow for evaluation and support.  5. Neuropsych: This patient is not fully capable of making decisions on her own behalf. 6. Skin/Wound Care: Monitor wound daily. Routine pressure relief measures.  7. Fluids/Electrolytes/Nutrition: Monitor I/O.   BMP within normal range on 3/6  8. VT/PEA arrest: NO DRIVING FOR 6 MONTHS POST ARREST. ICD in place.  9. Acute systolic dysfunction: Likely due to hypertensive  CM--on Coreg and Ace.  10. HTN: Monitor BP.   Continue coreg   Lisinopril 5mg  overall controlled however elevated this morning. Will continue to monitor and consider changes if persistently elevated,   11. Supra and infratentorial infarcts could be embolic due to cardiac cath v/s small vessel disease:  12. Bipolar disease: Mood stable on Effexor XR with xanax prn.  13. Leucocytosis: Resolved  Due to E coli UTI. Started on ceftriaxone and changed to ceftin  8.5 on 3/6  14. Acute renal insufficiency: Resolved, continue to push po fluids.  15.  Constipation: Improving  LOS (Days) 5 A FACE TO FACE EVALUATION WAS PERFORMED  Marie Ramos 09/10/2015 8:55 AM

## 2015-09-11 ENCOUNTER — Inpatient Hospital Stay (HOSPITAL_COMMUNITY): Payer: BLUE CROSS/BLUE SHIELD | Admitting: Speech Pathology

## 2015-09-11 ENCOUNTER — Inpatient Hospital Stay (HOSPITAL_COMMUNITY): Payer: BLUE CROSS/BLUE SHIELD | Admitting: *Deleted

## 2015-09-11 ENCOUNTER — Inpatient Hospital Stay (HOSPITAL_COMMUNITY): Payer: BLUE CROSS/BLUE SHIELD | Admitting: Occupational Therapy

## 2015-09-11 ENCOUNTER — Inpatient Hospital Stay (HOSPITAL_COMMUNITY): Payer: Self-pay | Admitting: Physical Therapy

## 2015-09-11 MED ORDER — SENNOSIDES-DOCUSATE SODIUM 8.6-50 MG PO TABS
1.0000 | ORAL_TABLET | Freq: Two times a day (BID) | ORAL | Status: DC
  Administered 2015-09-11 – 2015-09-13 (×4): 1 via ORAL
  Filled 2015-09-11 (×5): qty 1

## 2015-09-11 MED ORDER — SALINE SPRAY 0.65 % NA SOLN
1.0000 | NASAL | Status: DC | PRN
  Filled 2015-09-11: qty 44

## 2015-09-11 NOTE — Progress Notes (Signed)
Physical Therapy Session Note  Patient Details  Name: Marie Ramos MRN: [id number] Date of Birth: 1955-10-16  Today's Date: 09/11/2015 PT Individual Time: 1300-1410 PT Individual Time Calculation (min): 70 min   Short Term Goals: Week 1:  PT Short Term Goal 1 (Week 1): STG=LTG due to LOS, Mod I transfers and household gait, S community  Skilled Therapeutic Interventions/Progress Updates:    Pt received resting in recliner with c/o 'mild pain' in L pectoral but agreeable to therapy.  Session focus on activity tolerance, transfers, standing balance, high level gait, and stair negotiation.  Pt amb within room with distant supervision and no RW, dynamic standing balance at EOB folding laundry with supervision>mod I.  Pt amb to therapy apartment with supervision and RW for energy conservation.  PT instructed pt in tub transfer with tub transfer bench x2 with steady assist first trial and supervision 2nd trial.  Discussed option of installing grab bar for increased security during transfer.  PT instructed pt in static stance activity x5 min +4 min at pipe tree in tandem stance with RLE and LLE forward respectively.  Pt self selecting time for rest breaks without cues.  PT instructed pt in stair negotiation x12 stairs with L handrail and close supervision.  PT provided pt with OTAGO level C handout and instructed pt in repeated sit<>stand (2x10), side stepping to R and L, tandem walking, and minisquats.  Pt reporting extreme fatigue and PT noting need for closer supervision. Pt returned to room and PT reviewed remaining OTAGO exercises verbally and provided recommendations for transitioning this program to home.  PT also oriented pt to rehab notebook.  Pt left sitting in recliner with call bell in reach and needs met.  Missed 20 minutes of skilled PT due to fatigue.   Therapy Documentation Precautions:  Precautions Precautions: Fall Precaution Comments: pacemaker precautions Restrictions Weight  Bearing Restrictions: No General: PT Amount of Missed Time (min): 20 Minutes PT Missed Treatment Reason: Patient fatigue   See Function Navigator for Current Functional Status.   Therapy/Group: Individual Therapy  Earnest Conroy Penven-Crew 09/11/2015, 2:19 PM

## 2015-09-11 NOTE — Progress Notes (Signed)
Physical Therapy Session Note  Patient Details  Name: Marie Ramos MRN: [id number] Date of Birth: 09-25-1955  Today's Date: 09/11/2015 PT Individual Time: 1131-1201 PT Individual Time Calculation (min): 30 min   Short Term Goals: Week 1:  PT Short Term Goal 1 (Week 1): STG=LTG due to LOS, Mod I transfers and household gait, S community Week 2:     Skilled Therapeutic Interventions/Progress Updates:    Patient received sitting in Recliner with recreational therapist. Patient instructed in gait training 144ft with RW and cues for improved safety with turns and min cues for recall for navigation.   Balance training:  standing on airex ball toss 2x 3 minutes, Min A. Toe taps on 6 inch step 2x 10, min A and mod A x 1 to prevent lateral LOB.  Toe taps on 6 inch cone x 8 with light UE support, x 8 without UE support. MinA for improved balance Toe Tap taps on 2 6 inch cones, x 8 with BUE support, x 3 without UE support and Min A to prevent LOB.  PT provided moderate cueing for improved technique with exercises, improved weight shift with SLS tasks, increased step height to prevent dragging foot. Patient was able to progress well with dynamic standing balance tasks.   Step up/down on 6 inch step x 12 BLE. BUE support on hand rails. Increased difficulty on R LE compared to L Gait back to room with RW ~150 ft and supervision assist; min A provided to help patient locate room. Left in recliner with call bell within reach.    Therapy Documentation Precautions:  Precautions Precautions: Fall Precaution Comments: pacemaker precautions Restrictions Weight Bearing Restrictions: No General: PT Amount of Missed Time (min): 20 Minutes PT Missed Treatment Reason: Patient fatigue Vital Signs: Therapy Vitals Temp: 98.2 F (36.8 C) Temp Source: Oral Pulse Rate: 60 Resp: 18 BP: (!) 116/58 mmHg Patient Position (if appropriate): Sitting Oxygen Therapy SpO2: 100 % O2 Device: Not  Delivered Pain:     See Function Navigator for Current Functional Status.   Therapy/Group: Individual Therapy  [name] 09/11/2015, 4:08 PM

## 2015-09-11 NOTE — Progress Notes (Signed)
Marie Ramos PHYSICAL MEDICINE & REHABILITATION     PROGRESS NOTE  Subjective/Complaints:  Sitting up in her chair this morning. That she went to sleep poorly last night and so she woke up a little earlier today. She only she does not need senna.  ROS: Denies CP, SOB, N/V/D.  Objective: Vital Signs: Blood pressure 151/89, pulse 75, temperature 97.9 F (36.6 C), temperature source Oral, resp. rate 18, height 5\' 2"  (1.575 m), weight 103.057 kg (227 lb 3.2 oz), SpO2 98 %. No results found.  Recent Labs  09/10/15 0626  WBC 8.5  HGB 11.1*  HCT 35.2*  PLT 421*    Recent Labs  09/10/15 0626  NA 143  K 4.6  CL 106  GLUCOSE 84  BUN 20  CREATININE 0.91  CALCIUM 9.5   CBG (last 3)  No results for input(s): GLUCAP in the last 72 hours.  Wt Readings from Last 3 Encounters:  09/05/15 103.057 kg (227 lb 3.2 oz)  09/04/15 100.245 kg (221 lb)    Physical Exam:  BP 151/89 mmHg  Pulse 75  Temp(Src) 97.9 F (36.6 C) (Oral)  Resp 18  Ht 5\' 2"  (1.575 m)  Wt 103.057 kg (227 lb 3.2 oz)  BMI 41.54 kg/m2  SpO2 98% Constitutional: She appears well-developed and well-nourished.  NAD. Vital signs reviewed.  HENT: Normocephalic and atraumatic.  Eyes: Conjunctivae and EOM are normal.  Cardiovascular: Normal rate and regular rhythm.No murmur heard. Respiratory: Effort normal and breath sounds normal.  GI: Soft. Bowel sounds are normal. She exhibits no distension. There is no tenderness.  Musculoskeletal: She exhibits no edema or tenderness.  Neurological: She is alert and oriented.  Able to follow simple commands.  Motor:  RUE: 4+/5 proximal to distal LUE: 4+/5 proximal to distal LLE: Hip flexion 4+/5, knee extension 5/5, ankle dorsi/plantarflexion 5/5  RLE: Hip flexion 5/5, knee extension 5/5, ankle dorsi/plantarflexion 5/5  Skin: Skin is warm and dry. No rash noted.  Psychiatric: She has a normal mood and affect. Her behavior is normal.   Assessment/Plan: 1. Functional  deficits secondary to Cardiac arrest with hypoxic encephalopathy and embolic/watershed stroke which require 3+ hours per day of interdisciplinary therapy in a comprehensive inpatient rehab setting. Physiatrist is providing close team supervision and 24 hour management of active medical problems listed below. Physiatrist and rehab team continue to assess barriers to discharge/monitor patient progress toward functional and medical goals.  Function:  Bathing Bathing position   Position: Shower  Bathing parts Body parts bathed by patient: Left arm, Chest, Abdomen, Front perineal area, Buttocks, Right upper leg, Left upper leg, Right lower leg, Left lower leg, Back, Right arm Body parts bathed by helper: Right arm  Bathing assist Assist Level: Supervision or verbal cues      Upper Body Dressing/Undressing Upper body dressing   What is the patient wearing?: Pull over shirt/dress     Pull over shirt/dress - Perfomed by patient: Thread/unthread right sleeve, Pull shirt over trunk, Thread/unthread left sleeve, Put head through opening          Upper body assist Assist Level: More than reasonable time   Set up : To obtain clothing/put away  Lower Body Dressing/Undressing Lower body dressing   What is the patient wearing?: Underwear, Pants, Socks, Shoes Underwear - Performed by patient: Thread/unthread right underwear leg, Thread/unthread left underwear leg, Pull underwear up/down   Pants- Performed by patient: Thread/unthread right pants leg, Thread/unthread left pants leg, Pull pants up/down   Non-skid slipper socks-  Performed by patient: Don/doff left sock, Don/doff right sock   Socks - Performed by patient: Don/doff right sock, Don/doff left sock   Shoes - Performed by patient: Don/doff right shoe, Don/doff left shoe, Fasten right, Fasten left            Lower body assist Assist for lower body dressing: Supervision or verbal cues      Toileting Toileting   Toileting steps  completed by patient: Adjust clothing prior to toileting, Performs perineal hygiene, Adjust clothing after toileting Toileting steps completed by helper: Performs perineal hygiene Toileting Assistive Devices: Grab bar or rail  Toileting assist Assist level: Supervision or verbal cues   Transfers Chair/bed transfer   Chair/bed transfer method: Ambulatory Chair/bed transfer assist level: Supervision or verbal cues Chair/bed transfer assistive device: Armrests, Environmental consultant (with and without RW)     Locomotion Ambulation     Max distance: 200 Assist level: Supervision or verbal cues   Wheelchair          Cognition Comprehension Comprehension assist level: Follows complex conversation/direction with extra time/assistive device  Expression Expression assist level: Expresses basic needs/ideas: With extra time/assistive device  Social Interaction Social Interaction assist level: Interacts appropriately 90% of the time - Needs monitoring or encouragement for participation or interaction.  Problem Solving Problem solving assist level: Solves basic 90% of the time/requires cueing < 10% of the time  Memory Memory assist level: Recognizes or recalls 75 - 89% of the time/requires cueing 10 - 24% of the time    Medical Problem List and Plan: 1. Weakness, abnormality of gait, and cognitive dysfunction secondary to Cardiac arrest with hypoxic encephalopathy and embolic/watershed stroke.  Cont CIR 2. LLE DVT/Anticoagulation: Pharmaceutical: Other (comment)-Eliquis 3. Pain Management: Tylenol prn effective for back/chest soreness.  4. Mood: Team to provide ego support. LCSW to follow for evaluation and support.  5. Neuropsych: This patient is not fully capable of making decisions on her own behalf. 6. Skin/Wound Care: Monitor wound daily. Routine pressure relief measures.  7. Fluids/Electrolytes/Nutrition: Monitor I/O.   BMP within normal range on 3/6  8. VT/PEA arrest: NO DRIVING FOR 6 MONTHS  POST ARREST. ICD in place.  9. Acute systolic dysfunction: Likely due to hypertensive CM--on Coreg and Ace.  10. HTN: Monitor BP.   Continue coreg   Lisinopril 5mg   Overall controlled however, she appears to have elevated readings at 3AM.  Will continue to monitor and consider changes if persistently elevated.   11. Supra and infratentorial infarcts could be embolic due to cardiac cath v/s small vessel disease:  12. Bipolar disease: Mood stable on Effexor XR with xanax prn.  13. Leucocytosis: Resolved  Due to E coli UTI. Started on ceftriaxone and changed to ceftin  8.5 on 3/6  14. Acute renal insufficiency: Resolved, continue to push po fluids.  15.  Constipation: Resolved  LOS (Days) 6 A FACE TO FACE EVALUATION WAS PERFORMED  Ankit [name] 09/11/2015 8:13 AM

## 2015-09-11 NOTE — Progress Notes (Signed)
Recreational Therapy Assessment and Plan  Patient Details  Name: DEVA RON MRN: [id number] Date of Birth: 1956-04-26 Today's Date: 09/11/2015  Rehab Potential: Good ELOS: 7 days   Assessment Clinical Impression:  Problem List:  Patient Active Problem List   Diagnosis Date Noted  . E. coli UTI 09/05/2015  . Anoxic brain injury ([place]) 09/05/2015  . Abnormality of gait   . Acute bilat watershed infarction [facility])   . Notalgia   . Acute systolic congestive heart failure ([place])   . Benign essential HTN   . Acute lower UTI   . AKI (acute kidney injury) ([place])   . History of traumatic brain injury   . Bipolar affective disorder in remission ([place])   . Leukocytosis   . Thrombocytosis ([place])   . Cerebral infarction due to embolism of cerebral artery ([place])   . Anoxic brain damage ([facility])   . Cough   . Essential hypertension   . Systolic dysfunction with acute on chronic heart failure ([place])   . Hypertrophic obstructive cardiomyopathy ([place]) 08/29/2015  . QT prolongation 08/29/2015  . Hypokalemia 08/29/2015  . Acute respiratory failure ([place])   . Cardiac arrest ([place]) 08/24/2015    Past Medical History:  Past Medical History  Diagnosis Date  . Bipolar 1 disorder ([place])   . Hypertrophic obstructive cardiomyopathy ([place]) 08/29/2015  . Cardiac arrest ([place]) 08/24/2015  . QT prolongation 08/29/2015  . TBI (traumatic brain injury) [facility]) 2011    Inpatient rehab [facility]  . Macular degeneration   . Chronically dry eyes   . Ventricular fibrillation ([place]) 09/03/15    MDT ICD Dr. Rayann Heman   Past Surgical History:  Past Surgical History  Procedure Laterality Date  . Cardiac catheterization N/A 08/24/2015    Procedure: Left Heart Cath and Coronary Angiography; Surgeon: Sherren Mocha, MD; Location: [place] [facility]; Service: Cardiovascular; Laterality: N/A;  . Abdominal  hysterectomy    . Tonsillectomy    . Splenectomy, total  2011  . Ep implantable device N/A 09/03/2015    MDT dual chamber ICD    Assessment & Plan Clinical Impression:60 y.o. female with history of fall with TBI/splenectomy, bipolar disorder with recent increase in psychiatric medications who was admitted on 08/24/15 after witnessed cardiac arrest. Patient unresponsive and pulseless and AED placed by school nurse with 2 shocks and CPR initiated. EMS evaluation showed PEA and ROSC after epinephrine--down time 10 minutes. Initial rhythm presumed to be VT. Patient intubated and cardiac cath was negative for CAD. She was treated with hypothermia protocol and arrest felt to be due to QT elongation. 2D echo with asymmetric septal hypertrophic CM without obstruction. Cardiac MRI showed hypertensive rather than HCM with EF 45%. She was extubated without difficulty on 02/23 and Psych consulted for medication adjustment and seroquel and adderral discontinued. Trileptal added to help with mood stabilization and high levels of anxiety managed with prn Xanax. Neurology consulted due to confusion and MRI brain done revealing scattered supra and infratentorial foci of acute ischemia measuring up to 11 mm-- question embolic phenomena and possible component of watershed ischemia. MRA without significant stenosis or aneurysm. Dr. Erlinda Hong recommended ASA and statin for strokes due to SVD v/s embolic due to cardiac cath. Acute systolic dysfunction treated with coreg and ACE titration and ICD to be placed on 02/27. BLE dopplers done revealed left posterior tibial and left peroneal veins DVT therefore patient started on Eliquis. Therapy initiated and patient noted to have impaired cognition with generalized weakness, SOB as well as decreased  balance. Patient transferred to CIR on 09/05/2015.  Met with pt to discuss TR services, use of leisure time post discharge, community reintegration, compensatory strategies &  energy conservation.  Pt identified 5-6 leisure activities for participation at discharge, mostly through her church/temple.  Pt also able to identify potential barriers to participation and problem solved through potential solutions with min questioning cues.  No further TR as pt is to discharge home at the end of the week.      Leisure History/Participation Premorbid leisure interest/current participation: Crafts - Knitting/Crocheting;Community - Grocery store;Community - Travel (Comment);Community - Chief Strategy Officer - Other (Comment);Games - Cross-word;Games - Word-search Other Leisure Interests: Television;Movies;Reading;Computer Leisure Participation Style: With Family/Friends Awareness of Community Resources: Good-identify 3 post discharge leisure resources Psychosocial / Spiritual Spiritual Interests: Church;Womens'Men's Groups Patient agreeable to Pet Therapy: Yes Does patient have pets?: No Social interaction - Mood/Behavior: Cooperative Recreational Therapy Orientation Orientation -Reviewed with patient: Available activity resources Strengths/Weaknesses Patient Strengths/Abilities: Willingness to participate;Active premorbidly Patient weaknesses: Physical limitations TR Patient demonstrates impairments in the following area(s): Behavior;Endurance;Motor;Safety  Plan Rec Therapy Plan Rehab Potential: Good Estimated Length of Stay: 7 days  The above assessment, treatment plan, treatment alternatives and goals were discussed and mutually agreed upon: by patient  [place] 09/11/2015, 3:40 PM

## 2015-09-11 NOTE — Consult Note (Signed)
NEUROCOGNITIVE TESTING - [facility]   MEDICAL NECESSITY:  Cherece Kieta was seen on the [facility] for neurocognitive testing owing to the patient's diagnosis of stroke and anoxic brain injury.    Records indicate that Mrs. Gruszka is a "60 y.o. female with history of fall with TBI/splenectomy, Bipolar disorder with recent increase in psychiatric medications who was admitted on 08/24/15 after witnessed cardiac arrest. Patient unresponsive and pulseless and AED placed by school nurse with 2 shocks and CPR initiated. EMS evaluation showed PEA and ROSC after epinephrine--down time 10 minutes. Initial rhythm presumed to be VT. Patient intubated and cardiac cath was negative for CAD. She was treated with hypothermia protocol and arrest felt to be due to QT elongation. 2D echo with asymmetric septal hypertrophic CM without obstruction.   Cardiac MRI showed hypertensive rather than HCM with EF 45%. She was extubated without difficulty on 02/23 and Psych consulted for medication adjustment and Seroquel and Adderall discontinued. Trileptal added to help with mood stabilization and high levels of anxiety managed with prn Xanax. Neurology consulted due to confusion and MRI brain done revealing scattered supra and infratentorial foci of acute ischemia measuring up to 11 mm-- question embolic phenomena and possible component of watershed ischemia. MRA without significant stenosis or aneurysm. Dr. Erlinda Hong recommended ASA and statin for strokes due to SVD v/s embolic due to cardiac cath. Acute systolic dysfunction treated with Coreg and ACE titration and ICD to be placed on 02/27. BLE dopplers done yesterday and revealed left posterior tibial and left peroneal veins DVT therefore patient started on Eliquis. Therapy initiated and patient noted to have impaired cognition with generalized weakness, SOB as well as decreased balance. CIR recommended for follow  up therapy."   During today's visit, Mrs. Clinesmith reported experiencing sudden onset memory loss for recent material post-anoxic brain injury with subsequent stroke. Remote memories are reportedly intact and she feels that her issues are resolving. No other cognitive, motor or sensory symptoms endorsed.   From an emotional standpoint, Mrs. Winebarger reported being diagnosed with Bipolar disorder. It is believed that her medical emergency was the result of her psychiatric medications and possible QT elongation. She described herself to be in good spirits despite her present situation. In fact, she appeared somewhat nonchalant about it. She was unable in the moment to clarify how she is now being treated for Bipolar disorder since many of her previous medications were discontinued. No adjustment issues endorsed. Suicidal/homicidal ideation, plan or intent was denied. No recent manic or hypomanic episodes were reported. The patient denied ever experiencing any auditory/visual hallucinations. No major behavioral or personality changes were endorsed.   Patient feels that progress is being made in therapy. No barriers to therapy identified. She described the rehab team as "extremely good." Her husband and family have been very supportive. She is anxious to return home but was unsure of what will happen about work.   The patient was referred for neuropsychological consultation given the possibility of cognitive sequelae subsequent to the current medical status and in order to assist in treatment planning.   PROCEDURES: [3 units of 96118]  Diagnostic Interview Medical record review Behavioral observations  Neuropsychological testing  TEST RESULTS:   RBANS Indices Scaled Score Percentile Description  Immediate Memory  65 1 Markedly impaired  Visuospatial/Constructional 62 1 Markedly impaired  Language 101 53 Average  Attention 85 16 Below average  Delayed Memory 68 2 Markedly impaired  Total Score 70 2  Markedly  impaired    RBANS Subtests Percentile Description  List Learning 13 Below average  Story Memory <1 Markedly impaired  Figure Copy <1 Markedly impaired  Line Orientation <1 Markedly impaired  Picture Naming 73 Average  Semantic Fluency 50 Average  Digit Span 45 Average  Coding 2 Markedly impaired  List Recall <1 Markedly impaired  List Recognition 12 Below average  Story Recall 2 Markedly impaired  Figure recall 2 Markedly impaired   Cognitive Evaluation: Test results revealed markedly impaired aspects of learning and memory, spatial discrimination, constructional praxis, and processing speed. Language and simple attention were intact.    IMPRESSION: Mrs. Cherrington reported noticing sudden onset cognitive difficulties secondary to her present medical situation. Cognitive testing revealed markedly impaired performance on tasks measuring learning and memory, spatial judgment, and processing speed. From an emotional standpoint, there were no frank reports of depression or anxiety that are not well controlled.   Mrs. Brenton's performance meets the criteria for a diagnosis of Major Neurocognitive Disorder (i.e., dementia) with the most probable etiology being the combination of anoxic brain injury and cerebrovascular compromise. Comprehensive neuropsychological testing is warranted upon discharge along with future testing to assess for interval change. Her care providers could consider treatment with nootropics.  Further recommendations are discussed below.    RECOMMENDATIONS  Recommendations for treatment team:  . When interacting with Mrs. Fradette, directions and information should be provided in a simple, straight forward manner, and the treatment team should avoid giving multiple instructions simultaneously.  . She is will only minimally benefit from being provided with multiple trials to learn new skills given the noted memory inefficiencies. In addition, she will greatly benefit  from recognition cueing.  . To the extent possible, multitasking should be avoided. . She requires more time than typical to process information. The treatment team may benefit from waiting for a verbal response to information before presenting additional information.  Marland Kitchen Be aware that she is suffering from significant visual spatial deficits of which she may not be aware. Taking this into consideration will help tailor therapy and keep accidents at [place] as much as possible when she is trying to navigate her surroundings.  . Performance will generally be best in a structured, routine, and familiar environment, as opposed to situations involving complex problems.  . Frequent reorientation will be helpful . Establish consistent daily routines . Use of short treatment sessions . Attend carefully to basic physiologic needs (e.g., nutrition, toileting, sleep, etc.) . Maintain as much as possible a quiet treatment environment . Avoid overstimulation  Recommendations for discharge planning:  . Complete a comprehensive neuropsychological evaluation as an outpatient in 8-12 months to assess for interval change. This can be done through Norton Pastel, PsyD by calling the following number: [phone].  Marland Kitchen Consider establishing care with a psychiatrist for management of psychopharmacological medications.  . Maintain engagement in mentally, physically and cognitively stimulating activities.  . Strive to maintain a healthy lifestyle (e.g., proper diet and exercise) in order to promote physical, cognitive and emotional health.  . Due to the nature and severity of the symptoms noted during this evaluation, it is recommended that she initially obtain constant care and supervision following this hospitalization.  . Her cognitive symptoms place her at a significant vocational disadvantage.  . Establishing a power of attorney is warranted.  . The patient should refrain from driving at this time.      Rutha Bouchard, Psy.D.  Clinical Neuropsychologist  Rehabilitation Psychologist

## 2015-09-11 NOTE — Progress Notes (Signed)
Occupational Therapy Session Note  Patient Details  Name: Marie Ramos MRN: [id number] Date of Birth: [birth year]  Today's Date: 09/11/2015 OT Individual Time: 1003-1059 OT Individual Time Calculation (min): 56 min    Short Term Goals: Week 1:  OT Short Term Goal 1 (Week 1): STGs=LTGs secondary to estimated short LOS  Skilled Therapeutic Interventions/Progress Updates:  Upon entering the room, pt seated in recliner chair with 2/10 c/o pain in chest. Pt agreeable to OT intervention. Pt ambulated in room without RW and supervision to obtain all needed items. Pt ambulated into walk in shower for bathing from TTB with sit <>stand to wash buttocks and peri area. Pt needing supervision for this task. Pt ambulated from shower to recliner chair for dressing tasks with supervision. Pt able to don B socks and shoes this session as well. Pt standing at sink side for all grooming tasks with mod I. Pt needing 1 rest break during this session which shows significant progress. Pt utilizing energy conservation methods throughout session. Call bell and all needed items within reach as pt remained seated in recliner chair upon exiting the room.   Therapy Documentation Precautions:  Precautions Precautions: Fall Precaution Comments: pacemaker precautions Restrictions Weight Bearing Restrictions: No   Pain: Pain Assessment Pain Assessment: No/denies pain Pain Score: 2  Pain Type: Surgical pain Pain Location: Chest Pain Descriptors / Indicators: Aching Pain Frequency: Intermittent Patients Stated Pain Goal: 3 Pain Intervention(s): Medication (See eMAR) Multiple Pain Sites: No  See Function Navigator for Current Functional Status.   Therapy/Group: Individual Therapy  Phineas Semen 09/11/2015, 12:44 PM

## 2015-09-11 NOTE — Progress Notes (Signed)
Speech Language Pathology Daily Session Note  Patient Details  Name: Marie Ramos MRN: [id number] Date of Birth: 1955/08/22  Today's Date: 09/11/2015 SLP Individual Time: 0800-0830 SLP Individual Time Calculation (min): 30 min  Short Term Goals: Week 1: SLP Short Term Goal 1 (Week 1): short term goals = long term goals due to length of stay  Skilled Therapeutic Interventions: Skilled treatment session focused on cognitive goals. Upon arrival, patient was ambulating out of the bathroom with her rolling walker unassisted. Patient reported she knew she was to call for help to request assistance but didn't want to impede on SLP's treatment time. SLP educated the patient on the importance of safety and calling for assistance, she verbalized understanding.  SLP facilitated session by providing extra time and supervision question cues for problem solving during a mildly complex scheduling task. Patient demonstrated selective attention to task in a moderately distracting environment for ~20 minutes with supervision verbal cues for redirection. Patient left upright in recliner with all needs within reach. Continue with current plan of care.     Function:   Cognition Comprehension Comprehension assist level: Follows complex conversation/direction with extra time/assistive device  Expression   Expression assist level: Expresses basic needs/ideas: With extra time/assistive device  Social Interaction Social Interaction assist level: Interacts appropriately 90% of the time - Needs monitoring or encouragement for participation or interaction.  Problem Solving Problem solving assist level: Solves basic 90% of the time/requires cueing < 10% of the time  Memory Memory assist level: Recognizes or recalls 75 - 89% of the time/requires cueing 10 - 24% of the time    Pain No/Denies Pain   Therapy/Group: Individual Therapy  Anterrio Mccleery 09/11/2015, 9:27 AM

## 2015-09-12 ENCOUNTER — Inpatient Hospital Stay (HOSPITAL_COMMUNITY): Payer: Self-pay | Admitting: Physical Therapy

## 2015-09-12 ENCOUNTER — Inpatient Hospital Stay (HOSPITAL_COMMUNITY): Payer: BLUE CROSS/BLUE SHIELD | Admitting: Occupational Therapy

## 2015-09-12 ENCOUNTER — Inpatient Hospital Stay (HOSPITAL_COMMUNITY): Payer: BLUE CROSS/BLUE SHIELD | Admitting: Speech Pathology

## 2015-09-12 NOTE — Progress Notes (Addendum)
Physical Therapy Session Note  Patient Details  Name: Marie Ramos MRN: [id number] Date of Birth: [birth year]  Today's Date: 09/12/2015 PT Individual Time: 1403-1430 and 1500-1541 PT Individual Time Calculation (min): 27 min and 41 min   Short Term Goals: Week 1:  PT Short Term Goal 1 (Week 1): STG=LTG due to LOS, Mod I transfers and household gait, S community  Skilled Therapeutic Interventions/Progress Updates:    Session 1: Pt received resting in recliner with husband present at bedside and agreeable to therapy session.  Pt requesting to show husband tub transfer bench and safety treads in shower.  Sit<>stand mod I and amb to ADL apartment supervision with RW.  Pt demonstrated tub transfer using bench to husband with PT providing distant supervision.  Discussed benefits of safety treads versus suction cup shower mat with pt and husband.  Pt amb back to rail outside room and PT instructed in OTAGO exercises not completed in yesterdays session (heel walking, toe walking, figure 8, and hamstring curls).  Pt returned to room at end of session and left positioned in recliner with call bell in reach and needs met. Mod I sign placed outside door.    Session 2: Pt received resting in recliner with husband, [name], present and agreeable to therapy session.  Session focus on activity tolerance and family education.  PT provided family education for [facility] regarding ambulation, stairs, car transfers, activity tolerance.  [name] provided supervision for pt to perform car transfer and provided verbal cues to sit first, then swing LEs in.  [name] provided appropriate supervision for ambulation to therapy gym and for pt to negotiation 8 steps with L hand rail.  Pt performed 12 min on Nustep at level 1 (decreased resistance 2/2 L knee pain) for cardiopulmonary endurance.  PT answered pt's and [name]'s questions throughout session to satisfaction.  Pt returned to room at end of session and positioned in recliner with call  bell in reach and needs met.   Therapy Documentation Precautions:  Precautions Precautions: Fall Precaution Comments: pacemaker precautions Restrictions Weight Bearing Restrictions: No  See Function Navigator for Current Functional Status.   Therapy/Group: Individual Therapy  Earnest Conroy Penven-Crew 09/12/2015, 2:52 PM

## 2015-09-12 NOTE — Progress Notes (Signed)
Speech Language Pathology Weekly Progress and Session Note  Patient Details  Name: Marie Ramos MRN: [id number] Date of Birth: Jan 09, 1956  Beginning of progress report period: 09/06/15 End of progress report period: 09/12/15 Today's Date: 09/12/2015 SLP Concurrent Time: 1305-1400 SLP Concurrent Time Calculation (min): 55 min   Short Term Goals: Week 1: SLP Short Term Goal 1 (Week 1): short term goals = long term goals due to length of stay SLP Short Term Goal 1 - Progress (Week 1): Progressing toward goal    New Short Term Goals: Week 2: SLP Short Term Goal 1 (Week 2): short term goals = long term goals due to length of stay  Weekly Progress Updates:     Intensity: Minumum of 1-2 x/day, 30 to 90 minutes Frequency: 3 to 5 out of 7 days Duration/Length of Stay: 7 days Treatment/Interventions: Cognitive remediation/compensation;Cueing hierarchy;Environmental controls;Functional tasks;Internal/external aids;Medication managment;Patient/family education;Speech/Language facilitation;Therapeutic Activities   Daily Session  Skilled Therapeutic Interventions: Pt's session focused on cognitive goals. Pt able to recall novel information with 80% acc mod I with compensatory strategies of rehearsal and visualization. Following multi-step written directions required mod A of verbal cueing for selective attention to task.     Function:     Cognition Comprehension Comprehension assist level: Understands complex 90% of the time/cues 10% of the time  Expression   Expression assist level: Expresses complex 90% of the time/cues < 10% of the time  Social Interaction Social Interaction assist level: Interacts appropriately 90% of the time - Needs monitoring or encouragement for participation or interaction.  Problem Solving Problem solving assist level: Solves basic 90% of the time/requires cueing < 10% of the time  Memory Memory assist level: Recognizes or recalls 75 - 89% of the time/requires  cueing 10 - 24% of the time   General    Pain Pain Assessment Pain Assessment: No/denies pain  Therapy/Group: Individual Therapy  Vinetta Bergamo 09/12/2015, 4:40 PM

## 2015-09-12 NOTE — Progress Notes (Signed)
[facility]IC[facility]ILIT[facility]     PROGRESS NOTE  Subjective/Complaints:  Patient seen sitting up in her chair. She states she is worn out from PT yesterday, but is pleased with her progress. She states she feels a little stronger.  ROS: Denies CP, SOB, N/V/D.  Objective: Vital Signs: Blood pressure 170/92, pulse 78, temperature 97.4 F (36.3 C), temperature source Oral, resp. rate 18, height 5\' 2"  (1.575 m), weight 103.057 kg (227 lb 3.2 oz), SpO2 96 %. No results found.  Recent Labs  09/10/15 0626  WBC 8.5  HGB 11.1*  HCT 35.2*  PLT 421*    Recent Labs  09/10/15 0626  NA 143  K 4.6  CL 106  GLUCOSE 84  BUN 20  CREATININE 0.91  CALCIUM 9.5   CBG (last 3)  No results for input(s): GLUCAP in the last 72 hours.  Wt Readings from Last 3 Encounters:  09/05/15 103.057 kg (227 lb 3.2 oz)  09/04/15 100.245 kg (221 lb)    Physical Exam:  BP 170/92 mmHg  Pulse 78  Temp(Src) 97.4 F (36.3 C) (Oral)  Resp 18  Ht 5\' 2"  (1.575 m)  Wt 103.057 kg (227 lb 3.2 oz)  BMI 41.54 kg/m2  SpO2 96% Constitutional: She appears well-developed and well-nourished.  NAD. Vital signs reviewed.  HENT: Normocephalic and atraumatic.  Eyes: Conjunctivae and EOM are normal.  Cardiovascular: Normal rate and regular rhythm.No murmur heard. Respiratory: Effort normal and breath sounds normal.  GI: Soft. Bowel sounds are normal. She exhibits no distension. There is no tenderness.  Musculoskeletal: She exhibits no edema or tenderness.  Neurological: She is alert and oriented.  Able to follow simple commands.  Motor:  RUE: 5/5 proximal to distal LUE: 4+/5 proximal to distal LLE: Hip flexion 4+/5, knee extension 5/5, ankle dorsi/plantarflexion 5/5  RLE: Hip flexion 5/5, knee extension 5/5, ankle dorsi/plantarflexion 5/5  Skin: Skin is warm and dry. No rash noted.  Psychiatric: She has a normal mood and affect. Her behavior is normal.   Assessment/Plan: 1. Functional  deficits secondary to Cardiac arrest with hypoxic encephalopathy and embolic/watershed stroke which require 3+ hours per day of interdisciplinary therapy in a comprehensive inpatient rehab setting. Physiatrist is providing close team supervision and 24 hour management of active medical problems listed below. Physiatrist and rehab team continue to assess barriers to discharge/monitor patient progress toward functional and medical goals.  Function:  Bathing Bathing position   Position: Shower  Bathing parts Body parts bathed by patient: Left arm, Chest, Abdomen, Front perineal area, Buttocks, Right upper leg, Left upper leg, Right lower leg, Left lower leg, Back, Right arm Body parts bathed by helper: Right arm  Bathing assist Assist Level: Supervision or verbal cues      Upper Body Dressing/Undressing Upper body dressing   What is the patient wearing?: Pull over shirt/dress     Pull over shirt/dress - Perfomed by patient: Thread/unthread right sleeve, Pull shirt over trunk, Thread/unthread left sleeve, Put head through opening          Upper body assist Assist Level: More than reasonable time   Set up : To obtain clothing/put away  Lower Body Dressing/Undressing Lower body dressing   What is the patient wearing?: Underwear, Pants, Socks, Shoes Underwear - Performed by patient: Thread/unthread right underwear leg, Thread/unthread left underwear leg, Pull underwear up/down   Pants- Performed by patient: Thread/unthread right pants leg, Thread/unthread left pants leg, Pull pants up/down, Fasten/unfasten pants   Non-skid slipper socks-  Performed by patient: Don/doff left sock, Don/doff right sock   Socks - Performed by patient: Don/doff right sock, Don/doff left sock   Shoes - Performed by patient: Don/doff right shoe, Don/doff left shoe, Fasten right, Fasten left            Lower body assist Assist for lower body dressing: Supervision or verbal cues       Toileting Toileting   Toileting steps completed by patient: Adjust clothing prior to toileting, Adjust clothing after toileting, Performs perineal hygiene Toileting steps completed by helper: Performs perineal hygiene Toileting Assistive Devices: Grab bar or rail  Toileting assist Assist level: Supervision or verbal cues   Transfers Chair/bed transfer   Chair/bed transfer method: Ambulatory Chair/bed transfer assist level: Supervision or verbal cues Chair/bed transfer assistive device: Armrests     Locomotion Ambulation     Max distance: 150 Assist level: Supervision or verbal cues   Wheelchair          Cognition Comprehension Comprehension assist level: Follows complex conversation/direction with extra time/assistive device  Expression Expression assist level: Expresses basic needs/ideas: With extra time/assistive device  Social Interaction Social Interaction assist level: Interacts appropriately 90% of the time - Needs monitoring or encouragement for participation or interaction.  Problem Solving Problem solving assist level: Solves basic 90% of the time/requires cueing < 10% of the time  Memory Memory assist level: Recognizes or recalls 75 - 89% of the time/requires cueing 10 - 24% of the time    Medical Problem List and Plan: 1. Weakness, abnormality of gait, and cognitive dysfunction secondary to Cardiac arrest with hypoxic encephalopathy and embolic/watershed stroke.  Cont CIR 2. LLE DVT/Anticoagulation: Pharmaceutical: Other (comment)-Eliquis 3. Pain Management: Tylenol prn effective for back/chest soreness.  4. Mood: Team to provide ego support. LCSW to follow for evaluation and support.  5. Neuropsych: This patient is not fully capable of making decisions on her own behalf. 6. Skin/Wound Care: Monitor wound daily. Routine pressure relief measures.  7. Fluids/Electrolytes/Nutrition: Monitor I/O.   Eating 100% of her meals  BMP within normal range on 3/6  8.  VT/PEA arrest: NO DRIVING FOR 6 MONTHS POST ARREST. ICD in place.  9. Acute systolic dysfunction: Likely due to hypertensive CM--on Coreg and Ace.  10. HTN: Monitor BP.   Continue coreg   Lisinopril 5mg   Overall controlled however, she continues to have elevated readings at 3AM.  Will continue to monitor and consider changes if persistently elevated.   11. Supra and infratentorial infarcts could be embolic due to cardiac cath v/s small vessel disease:  12. Bipolar disease: Mood stable on Effexor XR with xanax prn.  13. Leucocytosis: Resolved  Due to E coli UTI. Started on ceftriaxone and changed to ceftin-completed  8.5 on 3/6  14. Acute renal insufficiency: Resolved, continue to push po fluids.  15.  Constipation: Resolved  LOS (Days) 7 A FACE TO FACE EVALUATION WAS PERFORMED  Marie Ramos [name] 09/12/2015 8:49 AM

## 2015-09-12 NOTE — Progress Notes (Signed)
Occupational Therapy Session Note  Patient Details  Name: Marie Ramos MRN: [id number] Date of Birth: [birth year]  Today's Date: 09/12/2015 OT Individual Time: 1000-1100 OT Individual Time Calculation (min): 60 min    Short Term Goals: Week 1:  OT Short Term Goal 1 (Week 1): STGs=LTGs secondary to estimated short LOS  Skilled Therapeutic Interventions/Progress Updates:  Upon entering the room, pt seated in recliner chair with 5/10 c/o pain in left chest from pacemaker procedure. Pt is given medication by RN during this session. Pt ambulated in room without device with mod I to obtain all clothing items. Pt set up shower for bathing with items as necessary. Pt performed shower transfer with supervision. Pt engaged in bathing in shower while seated on TTB. Pt attempted lateral lean to clean buttocks but was concerned about thoroughness. Pt standing in shower briefly to clean more thorough with mod I. Pt ambulated to recliner chair in bedroom for dressing tasks with sit <>stand from chair with mod I. Pt engaging in energy conservation techniques with dressing tasks. Pt standing at sink side for grooming tasks before ambulating to ADL apartment to demonstrate knowledge of transfer into shower on TTB. Pt demonstrated with supervision for safety. OT recommended pt purchase safety treads in order to decrease fall risk. Pt returned to room in same manner as above. Call bell and all needed items within reach upon exiting the room.   Therapy Documentation Precautions:  Precautions Precautions: Fall Precaution Comments: pacemaker precautions Restrictions Weight Bearing Restrictions: No  See Function Navigator for Current Functional Status.   Therapy/Group: Individual Therapy  Phineas Semen 09/12/2015, 12:25 PM

## 2015-09-13 ENCOUNTER — Ambulatory Visit (HOSPITAL_COMMUNITY): Payer: Self-pay | Admitting: Physical Therapy

## 2015-09-13 ENCOUNTER — Inpatient Hospital Stay (HOSPITAL_COMMUNITY): Payer: BLUE CROSS/BLUE SHIELD | Admitting: Occupational Therapy

## 2015-09-13 ENCOUNTER — Inpatient Hospital Stay (HOSPITAL_COMMUNITY): Payer: BLUE CROSS/BLUE SHIELD | Admitting: Speech Pathology

## 2015-09-13 MED ORDER — ACETAMINOPHEN 325 MG PO TABS: 325.0000 mg | ORAL_TABLET | ORAL | Status: DC | PRN

## 2015-09-13 MED ORDER — BENZONATATE 100 MG PO CAPS: 100.0000 mg | ORAL_CAPSULE | Freq: Three times a day (TID) | ORAL | Status: DC | PRN

## 2015-09-13 MED ORDER — AMLODIPINE BESYLATE 5 MG PO TABS: 5.0000 mg | ORAL_TABLET | Freq: Every day | ORAL | Status: DC

## 2015-09-13 MED ORDER — LISINOPRIL 5 MG PO TABS: 5.0000 mg | ORAL_TABLET | Freq: Every day | ORAL | Status: DC

## 2015-09-13 MED ORDER — ALPRAZOLAM 0.25 MG PO TABS: 0.2500 mg | ORAL_TABLET | Freq: Every evening | ORAL | Status: DC | PRN

## 2015-09-13 MED ORDER — OXCARBAZEPINE 300 MG PO TABS: 300.0000 mg | ORAL_TABLET | Freq: Two times a day (BID) | ORAL | Status: DC

## 2015-09-13 MED ORDER — FAMOTIDINE 20 MG PO TABS: 20.0000 mg | ORAL_TABLET | Freq: Every day | ORAL | Status: DC

## 2015-09-13 MED ORDER — ATORVASTATIN CALCIUM 20 MG PO TABS: 20.0000 mg | ORAL_TABLET | Freq: Every day | ORAL | Status: DC

## 2015-09-13 MED ORDER — TRAMADOL HCL 50 MG PO TABS: 50.0000 mg | ORAL_TABLET | Freq: Four times a day (QID) | ORAL | Status: DC | PRN

## 2015-09-13 MED ORDER — SENNOSIDES-DOCUSATE SODIUM 8.6-50 MG PO TABS: 1.0000 | ORAL_TABLET | Freq: Two times a day (BID) | ORAL | Status: DC

## 2015-09-13 MED ORDER — APIXABAN 5 MG PO TABS: 5.0000 mg | ORAL_TABLET | Freq: Two times a day (BID) | ORAL | Status: DC

## 2015-09-13 MED ORDER — PROSIGHT PO TABS: 1.0000 | ORAL_TABLET | Freq: Every day | ORAL | Status: DC

## 2015-09-13 MED ORDER — CARVEDILOL 6.25 MG PO TABS: 6.2500 mg | ORAL_TABLET | Freq: Two times a day (BID) | ORAL | Status: DC

## 2015-09-13 NOTE — Progress Notes (Signed)
Physical Therapy Discharge Summary  Patient Details  Name: Marie Ramos MRN: [id number] Date of Birth: 05-29-1956  Today's Date: 09/13/2015 PT Individual Time: 0900-0956 PT Individual Time Calculation (min): 56 min    Patient has met 9 of 9 long term goals due to improved activity tolerance, improved balance, improved postural control, improved attention and improved awareness.  Patient to discharge at an ambulatory level Modified Independent.   Patient's care partner is independent to provide the necessary supervision assistance at discharge.  Recommendation:  Patient will benefit from ongoing skilled PT services in outpatient setting to continue to advance safe functional mobility, address ongoing impairments in balance, attention, activity tolerance, and safety awareness, and minimize fall risk.  Equipment: No equipment provided  Reasons for discharge: treatment goals met  Patient/family agrees with progress made and goals achieved: Yes   Skilled Therapeutic Intervention: Pt received resting in recliner and agreeable to therapy session.  Pt required more than a reasonable amount of time to don socks, shoes, and underwear mod I.  Sit<>stand and ambulatory transfers mod I throughout session.  Pt amb 150' mod I in controlled environment and with supervision in community environment to stay attended to task and focus on walking to reduce risk of falling.  BERG re administered.  Patient demonstrates lower fall risk as noted by score of  54/56 on Berg Balance Scale.  Recommending use of RW in community setting for energy conservation and in home if she or her husband notices furniture walking.  Both verbalized understanding.  Pt reviewed HEP with PT and described each exercise as well as instructions for frequency and repetitions to PT without cuing.  Pt and husband asked appropriate questions throughout session, which PT answered to satisfaction.  Pt left sitting in recliner with needs in  reach.    PT Discharge Precautions/Restrictions Precautions Precautions: Fall Precaution Comments: pacemaker precautions Restrictions Weight Bearing Restrictions: No  Cognition Overall Cognitive Status: Within Functional Limits for tasks assessed Arousal/Alertness: Awake/alert Orientation Level: Oriented X4 Sensation Sensation Light Touch: Appears Intact Stereognosis: Appears Intact Hot/Cold: Appears Intact Proprioception: Appears Intact Additional Comments: reports tingling in T1 dermatome on L and decreased sensation to light touch on L compared to R Coordination Gross Motor Movements are Fluid and Coordinated: Yes Fine Motor Movements are Fluid and Coordinated: Yes Finger Nose Finger Test: normal Motor  Motor Motor: Within Functional Limits  Mobility Bed Mobility Bed Mobility: Sit to Supine;Supine to Sit Supine to Sit: 6: Modified independent (Device/Increase time) Sit to Supine: 6: Modified independent (Device/Increase time) Transfers Transfers: Yes Stand Pivot Transfers: 6: Modified independent (Device/Increase time) Locomotion  Ambulation Ambulation: Yes Ambulation/Gait Assistance: 6: Modified independent (Device/Increase time);5: Supervision (mod I short distance/controlled environment, supervision community distance/environment) Ambulation Distance (Feet): 150 Feet Assistive device: Rolling walker Gait Gait: Yes Gait Pattern: Step-through pattern;Wide base of support;Decreased stride length Stairs / Additional Locomotion Stairs: Yes Stairs Assistance: 5: Supervision Stairs Assistance Details: Verbal cues for technique Stair Management Technique: One rail Left Number of Stairs: 12 Height of Stairs: 6 Wheelchair Mobility Wheelchair Mobility: No  Trunk/Postural Assessment  Cervical Assessment Cervical Assessment: Exceptions to [facility] (forward head) Thoracic Assessment Thoracic Assessment: Exceptions to [facility] (rounded shoulders) Lumbar Assessment Lumbar  Assessment: Within Functional Limits Postural Control Postural Control: Within Functional Limits  Balance Balance Balance Assessed: Yes Standardized Balance Assessment Standardized Balance Assessment: Berg Balance Test Berg Balance Test Sit to Stand: Able to stand without using hands and stabilize independently Standing Unsupported: Able to stand safely 2 minutes Sitting with  Back Unsupported but Feet Supported on Floor or Stool: Able to sit safely and securely 2 minutes Stand to Sit: Sits safely with minimal use of hands Transfers: Able to transfer safely, minor use of hands Standing Unsupported with Eyes Closed: Able to stand 10 seconds safely Standing Ubsupported with Feet Together: Able to place feet together independently and stand 1 minute safely From Standing, Reach Forward with Outstretched Arm: Can reach confidently >25 cm (10") From Standing Position, Pick up Object from Floor: Able to pick up shoe safely and easily From Standing Position, Turn to Look Behind Over each Shoulder: Looks behind from both sides and weight shifts well Turn 360 Degrees: Able to turn 360 degrees safely one side only in 4 seconds or less Standing Unsupported, Alternately Place Feet on Step/Stool: Able to stand independently and safely and complete 8 steps in 20 seconds Standing Unsupported, One Foot in Front: Able to place foot tandem independently and hold 30 seconds Standing on One Leg: Able to lift leg independently and hold 5-10 seconds Total Score: 54 Dynamic Sitting Balance Dynamic Sitting - Balance Support: No upper extremity supported;Feet unsupported Dynamic Sitting - Level of Assistance: 6: Modified independent (Device/Increase time) Static Standing Balance Static Standing - Balance Support: No upper extremity supported;During functional activity Static Standing - Level of Assistance: 6: Modified independent (Device/Increase time) Dynamic Standing Balance Dynamic Standing - Balance Support:  No upper extremity supported;During functional activity Dynamic Standing - Level of Assistance: 6: Modified independent (Device/Increase time) Extremity Assessment   RLE Assessment RLE Assessment: Within Functional Limits (4+/5 throughout) LLE Assessment LLE Assessment: Within Functional Limits (4+/5 throughout)   See Function Navigator for Current Functional Status.  Cerissa Zeiger E Penven-Crew 09/13/2015, 11:06 AM

## 2015-09-13 NOTE — Progress Notes (Signed)
Occupational Therapy Discharge and OT Intervention  Patient Details  Name: Marie Ramos MRN: [id number] Date of Birth: July 06, 1956  Today's Date: 09/13/2015 OT Individual Time: 2774-1287 OT Individual Time Calculation (min): 50 min    Patient has met 9 of 9 long term goals due to improved activity tolerance, improved balance, postural control, ability to compensate for deficits, improved attention and improved awareness.  Patient to discharge at overall Modified Independent level.  Patient's care partner is independent to provide the necessary physical assistance at discharge.    Reasons goals not met: all goals met  Recommendation:  Patient will benefit from ongoing skilled OT services in outpatient setting to continue to advance functional skills in the area of BADL and iADL.  Equipment: Tub transfer bench  Reasons for discharge: treatment goals met  Patient/family agrees with progress made and goals achieved: Yes   OT Intervention: Upon entering the room, pt with 3/10 c/o pain in L shoulder this session but agreeable to OT intervention. Pt ambulating in room  Without use of AD with mod I to obtain all needed items . Pt carried all items to ADL apartment for shower in standard tub/shower combination. Pt performed transfer onto TTB with supervision for safety. Pt bathing at shower level from bench with mod I. Pt transferring out of shower and sat in chair located in bathroom for dressing tasks with mod I. Pt then returned to room and performed all grooming tasks while standing at sink. Pt taking rest break after completing. OT educated pt on kitchen set up for precautions and safety within home. Pt verbalized understanding. Pt then ambulated 150' with RW to gym and able to reach down and obtain other needed items for discharge, placing them in RW bag, and returning to room with mod I. Pt with no further OT concerns this session. Call bell and all needed items within reach upon exiting the  room.   OT Discharge Precautions/Restrictions  Precautions Precautions: Fall Precaution Comments: pacemaker precautions Restrictions Weight Bearing Restrictions: No  Pain Pain Assessment Pain Assessment: 0-10 Pain Score: 3  Pain Type: Acute pain Pain Location: Shoulder Pain Orientation: Left Pain Descriptors / Indicators: Aching Pain Onset: On-going Patients Stated Pain Goal: 1 Pain Intervention(s): Repositioned Multiple Pain Sites: No Vision/Perception  Vision- History Baseline Vision/History: Wears glasses Wears Glasses: At all times Patient Visual Report: No change from baseline Vision- Assessment Eye Alignment: Within Functional Limits  Cognition Overall Cognitive Status: Within Functional Limits for tasks assessed Arousal/Alertness: Awake/alert Orientation Level: Oriented X4 Memory: Impaired Memory Impairment: Storage deficit;Retrieval deficit;Decreased recall of new information;Decreased short term memory Awareness: Impaired Awareness Impairment: Emergent impairment;Anticipatory impairment Problem Solving: Impaired Executive Function: Reasoning;Organizing Self Monitoring: Impaired Sensation Sensation Light Touch: Appears Intact Stereognosis: Appears Intact Hot/Cold: Appears Intact Proprioception: Appears Intact Additional Comments: reports tingling in T1 dermatome on L and decreased sensation to light touch on L compared to R Coordination Gross Motor Movements are Fluid and Coordinated: Yes Fine Motor Movements are Fluid and Coordinated: Yes Finger Nose Finger Test: normal Motor  Motor Motor: Within Functional Limits Mobility  Bed Mobility Bed Mobility: Sit to Supine;Supine to Sit Supine to Sit: 6: Modified independent (Device/Increase time) Sit to Supine: 6: Modified independent (Device/Increase time)  Trunk/Postural Assessment  Cervical Assessment Cervical Assessment: Exceptions to [facility] (forward head) Thoracic Assessment Thoracic Assessment:  Exceptions to [facility] (rounded shoulders) Lumbar Assessment Lumbar Assessment: Within Functional Limits Postural Control Postural Control: Within Functional Limits  Balance Balance Balance Assessed: Yes Standardized Balance Assessment Standardized Balance  Assessment: Berg Balance Test Berg Balance Test Sit to Stand: Able to stand without using hands and stabilize independently Standing Unsupported: Able to stand safely 2 minutes Sitting with Back Unsupported but Feet Supported on Floor or Stool: Able to sit safely and securely 2 minutes Stand to Sit: Sits safely with minimal use of hands Transfers: Able to transfer safely, minor use of hands Standing Unsupported with Eyes Closed: Able to stand 10 seconds safely Standing Ubsupported with Feet Together: Able to place feet together independently and stand 1 minute safely From Standing, Reach Forward with Outstretched Arm: Can reach confidently >25 cm (10") From Standing Position, Pick up Object from Floor: Able to pick up shoe safely and easily From Standing Position, Turn to Look Behind Over each Shoulder: Looks behind from both sides and weight shifts well Turn 360 Degrees: Able to turn 360 degrees safely one side only in 4 seconds or less Standing Unsupported, Alternately Place Feet on Step/Stool: Able to stand independently and safely and complete 8 steps in 20 seconds Standing Unsupported, One Foot in Front: Able to place foot tandem independently and hold 30 seconds Standing on One Leg: Able to lift leg independently and hold 5-10 seconds Total Score: 54 Dynamic Sitting Balance Dynamic Sitting - Balance Support: No upper extremity supported;Feet unsupported Dynamic Sitting - Level of Assistance: 6: Modified independent (Device/Increase time) Static Standing Balance Static Standing - Balance Support: No upper extremity supported;During functional activity Static Standing - Level of Assistance: 6: Modified independent (Device/Increase  time) Dynamic Standing Balance Dynamic Standing - Balance Support: No upper extremity supported;During functional activity Dynamic Standing - Level of Assistance: 6: Modified independent (Device/Increase time) Extremity/Trunk Assessment RUE Assessment RUE Assessment: Within Functional Limits LUE Assessment LUE Assessment: Exceptions to [facility] (pacemaker precautions)   See Function Navigator for Current Functional Status.  Phineas Semen 09/13/2015, 11:00 AM

## 2015-09-13 NOTE — Patient Care Conference (Signed)
Inpatient RehabilitationTeam Conference and Plan of Care Update Date: 09/12/2015   Time: 2:45 PM    Patient Name: Marie Ramos      Medical Record Number: [id number]  Date of Birth: 1956/03/12 Sex: Female         Room/Bed: 4M03C/4M03C-01 Payor Info: Payor: BLUE CROSS BLUE SHIELD / Plan: BCBS OTHER / Product Type: *No Product type* /    Admitting Diagnosis: CARDIAC ARREST HYPOXIC ENCEPHALOPATHY  Admit Date/Time:  09/05/2015  6:07 PM Admission Comments: No comment available   Primary Diagnosis:  <principal problem not specified> Principal Problem: <principal problem not specified>  Patient Active Problem List   Diagnosis Date Noted  . E. coli UTI 09/05/2015  . Anoxic brain injury ([place]) 09/05/2015  . Abnormality of gait   . Acute bilat watershed infarction [facility])   . Notalgia   . Acute systolic congestive heart failure ([place])   . Benign essential HTN   . Acute lower UTI   . AKI (acute kidney injury) ([place])   . History of traumatic brain injury   . Bipolar affective disorder in remission ([place])   . Leukocytosis   . Thrombocytosis ([place])   . Cerebral infarction due to embolism of cerebral artery ([place])   . Anoxic brain damage ([facility])   . Cough   . Essential hypertension   . Systolic dysfunction with acute on chronic heart failure ([place])   . Hypertrophic obstructive cardiomyopathy ([place]) 08/29/2015  . QT prolongation 08/29/2015  . Hypokalemia 08/29/2015  . Acute respiratory failure ([place])   . Cardiac arrest ([place]) 08/24/2015    Expected Discharge Date: Expected Discharge Date: 09/14/15  Team Members Present: Physician leading conference: Dr. Delice Lesch Social Worker Present: Lennart Pall, LCSW Nurse Present: Dorien Chihuahua, RN PT Present: Dwyane Dee, PT OT Present: Benay Pillow, OT SLP Present: Weston Anna, SLP PPS Coordinator present : Daiva Nakayama, RN, CRRN     Current Status/Progress Goal Weekly Team Focus  Medical   Weakness, abnormality of gait, and cognitive  dysfunction secondary to Cardiac arrest with hypoxic encephalopathy and embolic/watershed stroke  Improve cognition, BP  see above   Bowel/Bladder   Continent bowel and bladder.  BMs Q day.  No S/S infection, continue regular pattern.  Monitor, offer PRNs as needed.   Swallow/Nutrition/ Hydration             ADL's   supervision overall  ModI - supervision  pt/family education, ADL retraining, functional transfers, and safety   Mobility   mod I  mod I  goals met, d/c Friday   Communication             Safety/Cognition/ Behavioral Observations  Supervision  Supervision  attention, recall, problem solving    Pain   Lt chest at incision, ICD placement. Alternating tylenol with Ultram with minimal relief per patient.  2/10.  Offer PRNs, alternative methods of pain control.   Skin   Mild rash to chest and incision area (tape, dressing removed.) Ecchymosis BUEs resolving.   Resolving by discharge.  Monitor, PRN Benadryl.    Rehab Goals Patient on target to meet rehab goals: Yes *See Care Plan and progress notes for long and short-term goals.  Barriers to Discharge: BP, gait instability, cognition    Possible Resolutions to Barriers:  Optimize BP meds, pt and family edu, cont therapies    Discharge Planning/Teaching Needs:  home with husband who is able to provide 24/7 supervision/ assist      Team Discussion:  Meeting mod independent  goals.  Grad day tomorrow.  Still with recall and sequencing issues but awareness much improved.  Revisions to Treatment Plan:  NA   Continued Need for Acute Rehabilitation Level of Care: The patient requires daily medical management by a physician with specialized training in physical medicine and rehabilitation for the following conditions: Daily direction of a multidisciplinary physical rehabilitation program to ensure safe treatment while eliciting the highest outcome that is of practical value to the patient.: Yes Daily medical management of  patient stability for increased activity during participation in an intensive rehabilitation regime.: Yes Daily analysis of laboratory values and/or radiology reports with any subsequent need for medication adjustment of medical intervention for : Cardiac problems;Neurological problems;Blood pressure problems  Krysia Zahradnik, [place] 09/14/2015, 9:38 AM

## 2015-09-13 NOTE — Progress Notes (Signed)
[place] PHYSICAL MEDICINE & REHABILITATION     PROGRESS NOTE  Subjective/Complaints:  Pt sitting up in her chair eating breakfast, husband present.  Pt slept well overnight and is looking forward to going home soon.   ROS: Denies CP, SOB, N/V/D.  Objective: Vital Signs: Blood pressure 131/72, pulse 71, temperature 98 F (36.7 C), temperature source Oral, resp. rate 18, height 5\' 2"  (1.575 m), weight 101.152 kg (223 lb), SpO2 100 %. No results found. No results for input(s): WBC, HGB, HCT, PLT in the last 72 hours. No results for input(s): NA, K, CL, GLUCOSE, BUN, CREATININE, CALCIUM in the last 72 hours.  Invalid input(s): CO CBG (last 3)  No results for input(s): GLUCAP in the last 72 hours.  Wt Readings from Last 3 Encounters:  09/12/15 101.152 kg (223 lb)  09/04/15 100.245 kg (221 lb)    Physical Exam:  BP 131/72 mmHg  Pulse 71  Temp(Src) 98 F (36.7 C) (Oral)  Resp 18  Ht 5\' 2"  (1.575 m)  Wt 101.152 kg (223 lb)  BMI 40.78 kg/m2  SpO2 100% Constitutional: She appears well-developed and well-nourished.  NAD. Vital signs reviewed.  HENT: Normocephalic and atraumatic.  Eyes: Conjunctivae and EOM are normal.  Cardiovascular: Normal rate and regular rhythm.No murmur heard. Respiratory: Effort normal and breath sounds normal.  GI: Soft. Bowel sounds are normal. She exhibits no distension. There is no tenderness.  Musculoskeletal: She exhibits no edema or tenderness.  Neurological: She is alert and oriented.  Able to follow simple commands.  Motor:  RUE: 5/5 proximal to distal LUE: 5/5 proximal to distal LLE: Hip flexion 4+/5, knee extension 5/5, ankle dorsi/plantarflexion 5/5  RLE: Hip flexion 5/5, knee extension 5/5, ankle dorsi/plantarflexion 5/5  Skin: Skin is warm and dry. No rash noted.  Psychiatric: She has a normal mood and affect. Her behavior is normal.   Assessment/Plan: 1. Functional deficits secondary to Cardiac arrest with hypoxic  encephalopathy and embolic/watershed stroke which require 3+ hours per day of interdisciplinary therapy in a comprehensive inpatient rehab setting. Physiatrist is providing close team supervision and 24 hour management of active medical problems listed below. Physiatrist and rehab team continue to assess barriers to discharge/monitor patient progress toward functional and medical goals.  Function:  Bathing Bathing position   Position: Shower  Bathing parts Body parts bathed by patient: Left arm, Chest, Abdomen, Front perineal area, Buttocks, Right upper leg, Left upper leg, Right lower leg, Left lower leg, Back, Right arm Body parts bathed by helper: Right arm  Bathing assist Assist Level: More than reasonable time      Upper Body Dressing/Undressing Upper body dressing   What is the patient wearing?: Pull over shirt/dress     Pull over shirt/dress - Perfomed by patient: Thread/unthread right sleeve, Pull shirt over trunk, Thread/unthread left sleeve, Put head through opening          Upper body assist Assist Level: More than reasonable time   Set up : To obtain clothing/put away  Lower Body Dressing/Undressing Lower body dressing   What is the patient wearing?: Underwear, Pants, Socks, Shoes Underwear - Performed by patient: Thread/unthread right underwear leg, Thread/unthread left underwear leg, Pull underwear up/down   Pants- Performed by patient: Thread/unthread right pants leg, Thread/unthread left pants leg, Pull pants up/down, Fasten/unfasten pants   Non-skid slipper socks- Performed by patient: Don/doff left sock, Don/doff right sock   Socks - Performed by patient: Don/doff right sock, Don/doff left sock   Shoes - Performed  by patient: Don/doff right shoe, Don/doff left shoe, Fasten right, Fasten left            Lower body assist Assist for lower body dressing: More than reasonable time      Toileting Toileting   Toileting steps completed by patient: Adjust  clothing prior to toileting, Adjust clothing after toileting, Performs perineal hygiene Toileting steps completed by helper: Performs perineal hygiene Toileting Assistive Devices: Grab bar or rail  Toileting assist Assist level: No help/no cues (pt mod I in room)   Transfers Chair/bed transfer   Chair/bed transfer method: Ambulatory Chair/bed transfer assist level: No Help, no cues, assistive device, takes more than a reasonable amount of time Chair/bed transfer assistive device: Armrests, Medical sales representative     Max distance: 150 Assist level: No help, No cues, assistive device, takes more than a reasonable amount of time   Wheelchair          Cognition Comprehension Comprehension assist level: Understands complex 90% of the time/cues 10% of the time  Expression Expression assist level: Expresses complex 90% of the time/cues < 10% of the time  Social Interaction Social Interaction assist level: Interacts appropriately 90% of the time - Needs monitoring or encouragement for participation or interaction.  Problem Solving Problem solving assist level: Solves basic problems with no assist  Memory Memory assist level: Recognizes or recalls 90% of the time/requires cueing < 10% of the time    Medical Problem List and Plan: 1. Weakness, abnormality of gait, and cognitive dysfunction secondary to Cardiac arrest with hypoxic encephalopathy and embolic/watershed stroke.  Plan for d/c tomorrow 2. LLE DVT/Anticoagulation: Pharmaceutical: Other (comment)-Eliquis 3. Pain Management: Tylenol prn effective for back/chest soreness.  4. Mood: Team to provide ego support. LCSW to follow for evaluation and support.  5. Neuropsych: This patient is not fully capable of making decisions on her own behalf. 6. Skin/Wound Care: Monitor wound daily. Routine pressure relief measures.  7. Fluids/Electrolytes/Nutrition: Monitor I/O.   Eating 100% of her meals  BMP within normal range on  3/6  8. VT/PEA arrest: NO DRIVING FOR 6 MONTHS POST ARREST. ICD in place.  9. Acute systolic dysfunction: Likely due to hypertensive CM--on Coreg and Ace.  10. HTN: Monitor BP.   Continue coreg   Lisinopril 5mg   Overall controlled however, she continues to have elevated readings early in the AM.  Will continue to monitor and consider changes if persistently elevated.   11. Supra and infratentorial infarcts could be embolic due to cardiac cath v/s small vessel disease:  12. Bipolar disease: Mood stable on Effexor XR with xanax prn.  13. Leucocytosis: Resolved  Due to E coli UTI. Started on ceftriaxone and changed to ceftin-completed  8.5 on 3/6  14. Acute renal insufficiency: Resolved, continue to push po fluids.  15.  Constipation: Resolved  LOS (Days) 8 A FACE TO FACE EVALUATION WAS PERFORMED  Dimples Probus [name] 09/13/2015 8:20 AM

## 2015-09-13 NOTE — Progress Notes (Signed)
Speech Language Pathology Discharge Summary  Patient Details  Name: Marie Ramos MRN: [id number] Date of Birth: 03/20/1956  Today's Date: 09/13/2015 SLP Individual Time: 0800-0900 SLP Individual Time Calculation (min): 60 min   Skilled Therapeutic Interventions:  Pt seen for skilled speech therapy targeting cognitive goals. Spouse joined [name] for the session. Pt able to recall rules of game from previous session with 90% acc. Min A with high level math word problems. Pt was able to verbally describe appropriate safety precautions with 100% acc for all situations provided. Discussed need to decrease environmental distractions and use compensatory strategies to improve attention. Pt and her spouse verbalized good understanding of pt's current limitations and needs moving forward.  Patient has met 4 of 4 long term goals.  Patient to discharge at overall Supervision;Modified Independent level.  Reasons goals not met:     Clinical Impression/Discharge Summary: Pt will require assist with high level cognitive-linguistic activities, but is modified independent with basic functional daily living.   Care Partner:  Caregiver Able to Provide Assistance: Yes  Type of Caregiver Assistance: Cognitive  Recommendation:  Outpatient SLP  Rationale for SLP Follow Up: Maximize cognitive function and independence;Reduce caregiver burden     Reasons for discharge: Discharged from hospital   Patient/Family Agrees with Progress Made and Goals Achieved: Yes   Function:    Cognition Comprehension Comprehension assist level: Understands complex 90% of the time/cues 10% of the time  Expression   Expression assist level: Expresses complex 90% of the time/cues < 10% of the time  Social Interaction Social Interaction assist level: Interacts appropriately 90% of the time - Needs monitoring or encouragement for participation or interaction.  Problem Solving Problem solving assist level: Solves basic problems  with no assist  Memory Memory assist level: Recognizes or recalls 90% of the time/requires cueing < 10% of the time   Vinetta Bergamo 09/13/2015, 4:34 PM

## 2015-09-13 NOTE — Progress Notes (Signed)
Social Work Patient ID: Marie Ramos, female   DOB: [birth year], 60 y.o.   MRN: [id number]   Met with pt and husband yesterday afternoon to review team conference.  Both very pleased with pt's progress this week and agree with targeted d/c 3/10 with mod independent goals.  Husband in today for family education.  Have arrange OP tx follow up.    Leighanne Adolph, LCSW

## 2015-09-14 MED ORDER — ALPRAZOLAM 0.25 MG PO TABS: 0.2500 mg | ORAL_TABLET | Freq: Every evening | ORAL | Status: DC | PRN

## 2015-09-14 MED ORDER — TRAMADOL HCL 50 MG PO TABS: 50.0000 mg | ORAL_TABLET | Freq: Four times a day (QID) | ORAL | Status: DC | PRN

## 2015-09-14 NOTE — Progress Notes (Signed)
[place] PHYSICAL MEDICINE & REHABILITATION     PROGRESS NOTE  Subjective/Complaints:  Patient seen ambulating in her room. She is excited to go home today and states that she is packed and dressed.  ROS: Denies CP, SOB, N/V/D.  Objective: Vital Signs: Blood pressure 138/67, pulse 70, temperature 97.8 F (36.6 C), temperature source Oral, resp. rate 16, height 5\' 2"  (1.575 m), weight 101.152 kg (223 lb), SpO2 97 %. No results found. No results for input(s): WBC, HGB, HCT, PLT in the last 72 hours. No results for input(s): NA, K, CL, GLUCOSE, BUN, CREATININE, CALCIUM in the last 72 hours.  Invalid input(s): CO CBG (last 3)  No results for input(s): GLUCAP in the last 72 hours.  Wt Readings from Last 3 Encounters:  09/12/15 101.152 kg (223 lb)  09/04/15 100.245 kg (221 lb)    Physical Exam:  BP 138/67 mmHg  Pulse 70  Temp(Src) 97.8 F (36.6 C) (Oral)  Resp 16  Ht 5\' 2"  (1.575 m)  Wt 101.152 kg (223 lb)  BMI 40.78 kg/m2  SpO2 97% Constitutional: She appears well-developed and well-nourished.  NAD. Vital signs reviewed.  HENT: Normocephalic and atraumatic.  Eyes: Conjunctivae and EOM are normal.  Cardiovascular: Normal rate and regular rhythm.No murmur heard. Respiratory: Effort normal and breath sounds normal.  GI: Soft. Bowel sounds are normal. She exhibits no distension. There is no tenderness.  Musculoskeletal: She exhibits no edema or tenderness.  Neurological: She is alert and oriented.  Able to follow simple commands.  Motor:  RUE: 5/5 proximal to distal LUE: 5/5 proximal to distal LLE: Hip flexion 5/5, knee extension 5/5, ankle dorsi/plantarflexion 5/5  RLE: Hip flexion 5/5, knee extension 5/5, ankle dorsi/plantarflexion 5/5  Skin: Skin is warm and dry. No rash noted.  Psychiatric: She has a normal mood and affect. Her behavior is normal.   Assessment/Plan: 1. Functional deficits secondary to Cardiac arrest with hypoxic encephalopathy and  embolic/watershed stroke which require 3+ hours per day of interdisciplinary therapy in a comprehensive inpatient rehab setting. Physiatrist is providing close team supervision and 24 hour management of active medical problems listed below. Physiatrist and rehab team continue to assess barriers to discharge/monitor patient progress toward functional and medical goals.  Function:  Bathing Bathing position   Position: Shower  Bathing parts Body parts bathed by patient: Left arm, Chest, Abdomen, Front perineal area, Buttocks, Right upper leg, Left upper leg, Right lower leg, Left lower leg, Back, Right arm Body parts bathed by helper: Right arm  Bathing assist Assist Level: More than reasonable time      Upper Body Dressing/Undressing Upper body dressing   What is the patient wearing?: Pull over shirt/dress     Pull over shirt/dress - Perfomed by patient: Thread/unthread right sleeve, Pull shirt over trunk, Thread/unthread left sleeve, Put head through opening          Upper body assist Assist Level: More than reasonable time   Set up : To obtain clothing/put away  Lower Body Dressing/Undressing Lower body dressing   What is the patient wearing?: Underwear, Pants, Socks, Shoes Underwear - Performed by patient: Thread/unthread right underwear leg, Thread/unthread left underwear leg, Pull underwear up/down   Pants- Performed by patient: Thread/unthread right pants leg, Thread/unthread left pants leg, Pull pants up/down, Fasten/unfasten pants   Non-skid slipper socks- Performed by patient: Don/doff left sock, Don/doff right sock   Socks - Performed by patient: Don/doff right sock, Don/doff left sock   Shoes - Performed by patient: Don/doff  right shoe, Don/doff left shoe, Fasten right, Fasten left            Lower body assist Assist for lower body dressing: More than reasonable time      Toileting Toileting   Toileting steps completed by patient: Adjust clothing prior to  toileting, Adjust clothing after toileting, Performs perineal hygiene Toileting steps completed by helper: Performs perineal hygiene Toileting Assistive Devices: Grab bar or rail  Toileting assist Assist level: More than reasonable time   Transfers Chair/bed transfer   Chair/bed transfer method: Ambulatory, Stand pivot Chair/bed transfer assist level: No Help, no cues, assistive device, takes more than a reasonable amount of time Chair/bed transfer assistive device: Armrests, Medical sales representative     Max distance: 150 Assist level: Supervision or verbal cues   Wheelchair          Cognition Comprehension Comprehension assist level: Understands complex 90% of the time/cues 10% of the time  Expression Expression assist level: Expresses complex 90% of the time/cues < 10% of the time  Social Interaction Social Interaction assist level: Interacts appropriately 90% of the time - Needs monitoring or encouragement for participation or interaction.  Problem Solving Problem solving assist level: Solves basic problems with no assist  Memory Memory assist level: Recognizes or recalls 90% of the time/requires cueing < 10% of the time    Medical Problem List and Plan: 1. Weakness, abnormality of gait, and cognitive dysfunction secondary to Cardiac arrest with hypoxic encephalopathy and embolic/watershed stroke.  D/c today 2. LLE DVT/Anticoagulation: Pharmaceutical: Other (comment)-Eliquis 3. Pain Management: Tylenol prn effective for back/chest soreness.  4. Mood: Team to provide ego support. LCSW to follow for evaluation and support.  5. Neuropsych: This patient is not fully capable of making decisions on her own behalf. 6. Skin/Wound Care: Monitor wound daily. Routine pressure relief measures.  7. Fluids/Electrolytes/Nutrition: Monitor I/O.   Eating 100% of her meals  BMP within normal range on 3/6  8. VT/PEA arrest: NO DRIVING FOR 6 MONTHS POST ARREST. ICD in place.   9. Acute systolic dysfunction: Likely due to hypertensive CM--on Coreg and Ace.  10. HTN: Monitor BP.   Continue coreg   Lisinopril 5mg   Well controlled 11. Supra and infratentorial infarcts could be embolic due to cardiac cath v/s small vessel disease:  12. Bipolar disease: Mood stable on Effexor XR with xanax prn.  13. Leucocytosis: Resolved  Due to E coli UTI. Started on ceftriaxone and changed to ceftin-completed  8.5 on 3/6  14. Acute renal insufficiency: Resolved, continue to push po fluids.  15.  Constipation: Resolved  LOS (Days) 9 A FACE TO FACE EVALUATION WAS PERFORMED  Terris Bodin [name] 09/14/2015 9:10 AM

## 2015-09-14 NOTE — Progress Notes (Signed)
Social Work  Discharge Note  The overall goal for the admission was met for:   Discharge location: Yes - home with husband who can provide 24/7 supervision  Length of Stay: Yes - 9 days  Discharge activity level: Yes - modified independent overall  Home/community participation: Yes  Services provided included: MD, RD, PT, OT, SLP, RN, TR, Pharmacy, Neuropsych and SW  Financial Services: Private Insurance: [place]  Follow-up services arranged: Outpatient: PT, OT, ST via [facility] and Patient/Family has no preference for HH/DME agencies  Comments (or additional information):  Patient/Family verbalized understanding of follow-up arrangements: Yes  Individual responsible for coordination of the follow-up plan: pt/spouse  Confirmed correct DME delivered: NA    Marie Ramos

## 2015-09-14 NOTE — Progress Notes (Signed)
Discharge instructions given to pt and husband by Reesa Chew, PA with verbal understanding. Pt discharged to home with husband with belongings with pt.

## 2015-09-14 NOTE — Discharge Instructions (Signed)
Inpatient Rehab Discharge Instructions  Marie Ramos Discharge date and time:  09/14/15  Activities/Precautions/ Functional Status: Activity: no lifting, driving, or strenuous exercise till cleared by MD. No driving for 6 months.  Diet: cardiac diet Wound Care: keep wound clean and dry   Functional status:  ___ No restrictions     ___ Walk up steps independently ___ 24/7 supervision/assistance   ___ Walk up steps with assistance _X__ Intermittent supervision/assistance  ___ Bathe/dress independently ___ Walk with walker     ___ Bathe/dress with assistance ___ Walk Independently    ___ Shower independently ___ Walk with assistance    ___ Shower with assistance _X__ No alcohol     ___ Return to work/school ________     COMMUNITY REFERRALS UPON DISCHARGE:    Outpatient: PT     OT    ST                   Agency: [facility] Phone: [phone]               Appointment Date/Time: 3/14 8:00 - 9:30  (OT and ST)                                                                 3/17  8:00 (physical therapy)     Special Instructions:    STROKE/TIA DISCHARGE INSTRUCTIONS SMOKING Cigarette smoking nearly doubles your risk of having a stroke & is the single most alterable risk factor  If you smoke or have smoked in the last 12 months, you are advised to quit smoking for your health.  Most of the excess cardiovascular risk related to smoking disappears within a year of stopping.  Ask you doctor about anti-smoking medications  [place] Quit Line: 1-800-QUIT NOW  Free Smoking Cessation Classes [phone]  CHOLESTEROL Know your levels; limit fat & cholesterol in your diet  Lipid Panel     Component Value Date/Time   CHOL 225* 09/03/2015 0248   TRIG 333* 09/03/2015 0248   HDL 43 09/03/2015 0248   CHOLHDL 5.2 09/03/2015 0248   VLDL 67* 09/03/2015 0248   LDLCALC 115* 09/03/2015 0248      Many patients benefit from treatment even if their cholesterol is at  goal.  Goal: Total Cholesterol (CHOL) less than 160  Goal:  Triglycerides (TRIG) less than 150  Goal:  HDL greater than 40  Goal:  LDL (LDLCALC) less than 100   BLOOD PRESSURE American Stroke Association blood pressure target is less that 120/80 mm/Hg  Your discharge blood pressure is:  BP: 131/72 mmHg  Monitor your blood pressure  Limit your salt and alcohol intake  Many individuals will require more than one medication for high blood pressure  DIABETES (A1c is a blood sugar average for last 3 months) Goal HGBA1c is under 7% (HBGA1c is blood sugar average for last 3 months)  Diabetes:     Lab Results  Component Value Date   HGBA1C 6.2* 09/03/2015     Your HGBA1c can be lowered with medications, healthy diet, and exercise.  Check your blood sugar as directed by your physician  Call your physician if you experience unexplained or low blood sugars.  PHYSICAL ACTIVITY/REHABILITATION Goal is 30 minutes at least 4 days per  week  Activity: Increase activity slowly, and No driving, Therapies: See above Return to work:  To be decided on follow up.   Activity decreases your risk of heart attack and stroke and makes your heart stronger.  It helps control your weight and blood pressure; helps you relax and can improve your mood.  Participate in a regular exercise program.  Talk with your doctor about the best form of exercise for you (dancing, walking, swimming, cycling).  DIET/WEIGHT Goal is to maintain a healthy weight  Your discharge diet is: Diet Heart Room service appropriate?: Yes; Fluid consistency:: Thin  liquids Your height is:  Height: 5\' 2"  (157.5 cm) Your current weight is: Weight: 101.152 kg (223 lb) Your Body Mass Index (BMI) is:  BMI (Calculated): 41.6  Following the type of diet specifically designed for you will help prevent another stroke.  Your goal weight is: 136 lbs  Your goal Body Mass Index (BMI) is 19-24.  Healthy food habits can help reduce 3 risk  factors for stroke:  High cholesterol, hypertension, and excess weight.  RESOURCES Stroke/Support Group:  Call [phone]   STROKE EDUCATION PROVIDED/REVIEWED AND GIVEN TO PATIENT Stroke warning signs and symptoms How to activate emergency medical system (call 911). Medications prescribed at discharge. Need for follow-up after discharge. Personal risk factors for stroke. Pneumonia vaccine given:  Flu vaccine given:  My questions have been answered, the writing is legible, and I understand these instructions.  I will adhere to these goals & educational materials that have been provided to me after my discharge from the hospital.      My questions have been answered and I understand these instructions. I will adhere to these goals and the provided educational materials after my discharge from the hospital.  Patient/Caregiver Signature _______________________________ Date __________  Clinician Signature _______________________________________ Date __________  Please bring this form and your medication list with you to all your follow-up doctor's appointments.

## 2015-09-17 NOTE — Discharge Summary (Signed)
NAME:  Marie Ramos, Marie Ramos NO.:  [id number]  MEDICAL RECORD NO.:  [id number]  LOCATION:  4M03C                        FACILITY:  [facility]  PHYSICIAN:  Delice Lesch, MD        DATE OF BIRTH:  01-28-1956  DATE OF ADMISSION:  09/05/2015 DATE OF DISCHARGE:  09/14/2015                              DISCHARGE SUMMARY   DISCHARGE DIAGNOSES  Hypoxic encephalopathy due to cardiac arrest.  ACTIVE PROBLEMS ADDRESSED DURING THIS STAY:  Emolic watershed stroke Acute sstolic dysfunction Hypertension. Bipolar disease. Leucocytosis. E.coli urinary tract infection. Aute renal insufficiency. Constipation.  DISCHARGE CONDITION:  Stable.  RECENT LABORATORY STUDIES:   BMP Latest Ref Rng 09/10/2015 09/06/2015 09/05/2015  Glucose 65 - 99 mg/dL 84 90 -  BUN 6 - 20 mg/dL 20 23(H) -  Creatinine 0.44 - 1.00 mg/dL 0.91 0.98 -  Sodium 135 - 145 mmol/L 143 140 -  Potassium 3.5 - 5.1 mmol/L 4.6 4.5 4.4  Chloride 101 - 111 mmol/L 106 103 -  CO2 22 - 32 mmol/L 27 26 -  Calcium 8.9 - 10.3 mg/dL 9.5 9.3 -   CBC Latest Ref Rng 09/10/2015 09/06/2015 09/05/2015  WBC 4.0 - 10.5 K/uL 8.5 11.9(H) 14.6(H)  Hemoglobin 12.0 - 15.0 g/dL 11.1(L) 11.3(L) 11.5(L)  Hematocrit 36.0 - 46.0 % 35.2(L) 36.6 37.1  Platelets 150 - 400 K/uL 421(H) 544(H) 528(H)     HISTORY OF PRESENT ILLNESS:  [name] is a 60 year old right- handed female with history of TBI, bipolar disorder with recent increase in psychiatric medications who was admitted on February 17th 2017, after a witnessed cardiac arrest.  The patient was unresponsive and pulseless requiring shocks as well as ACLS protocol--down time 10 minutes. Initial rhythm presumptively V-tach.  She was intubated and cardiac cath was negative for CAD.  She was treated with hypothermia protocol and arrest felt to be due to QT elongation from psychiatric meds.  A 2D echo showed asymmetrical septal hypertrophic cardiomyopathy without obstruction.  Cardiac MRI showed  hypertensive HCM with EF of 45%. She was extubated without difficulty and Psychiatry was consulted for medication adjustment.  Seroquel and Adderall were discontinued and Trileptal was added to help with mood stabilization. High levels of anxiety has been managed with p.r.n. Collie Siad of Xanax.    Neurology was consulted for input on ongoing confusion.  MRI of brain done revealing scattered supra and infratentorial foci of acute ischemia, question due to embolic phenomenon and possible component of watershed ischemia.  Dr. Erlinda Hong recommended aspirin and statin for stroke due to small-vessel disease versus emboli due to cardiac cath.  She was started on Coreg and ACE for management of acute systolic dysfunction and ICD was placed on September 03, 2015.  She reported LE pain and BLE dopplers showed possible left posterior tibial and left peroneal vein DVT therefore she was placed on Eliquis for treatment.  Therapy initiated and patient was noted to be limited by generalized weakness as well as impaired cognition and shortness of breath with gait disorder. CIR was recommended for followup therapies.  HOSPITAL COURSE:  [name] was admitted to Rehab on September 05, 2015, for inpatient therapies to consist of PT, OT, and  speech therapy at least 3 hours 5 days a week.  Past admission, physiatrist, rehab, RN, and therapy team have worked together to provide customized collaborative interdisciplinary care.  Daily weights were monitored for signs of overload. Her respiratory status has been stable and no signs of fluid retention noted. Her weight has been stable and was 223 pounds at discharge.  Blood pressures were monitored on b.i.d. and she is tolerating Coreg and lisinopril without difficulty.  Antibiotics was changed to Ceftin for treatment of E. coli UTI and has completed 7-day course of Antibiotic regimen. She has had complaints of dry cough which has improved with use of Tessalon perles.   Follow  up CBC showed that reactive leukocytosis has resolved.  She was encouraged to push p.o. fluids due to signs of prerenal azotemia. Followup check of lytes showed improvement with BUN down to 20 and creatinine at 0.91.  Her mood has been stable on Trileptal and anxiety levels are Controlled on current regimen.  P.o. intake has been good and she is continent of bowel and bladder.  She was instructed on not driving for at least 6 months and is to await Cardiology input prior to receiving  clearance to resume driving. She has made great progress physically as well as cognitively during her rehab stay. She is currently modified independent in supervised setting.  She will continue to see a followup outpatient therapy past discharge.  REHAB COURSE:  During patient's stay in rehab, weekly team conferences were held to monitor patient's progress, set goals, and discussed barriers to discharge.  At admission, patient required min assist for mobility and basic self-care tasks.  Cognitive evaluation revealed moderate-to-severe cognitive impairment, impaired by fluctuating orientation for issues with attention, memory, problem solving, as well as word-finding deficits.  MOCA testing revealed score of 16/30.  The patient has had improvement in activity tolerance, balance, postural control, as well as cognitive abilities.  At discharge, she was able to complain ADL tasks at modified independent level.  She was modified independent for transfers and ambulation in supervised setting.  She is able to ambulate in controlled environment for 150 feet at modified independent level and requires supervision, community setting for safety and to attend to task.  Recall is improved to 80% with use of compensatory strategies.  She requires mod assist with verbal cuing for selective attention to complete multistep tasks.  She requires intermittent supervision for cognitive tasks.  Family education was done with husband who  will provide supervision as needed for safety and for cognitive tasks.  On September 14, 2015, the patient was discharged to home in improved condition.  DISPOSITION:  Home.  DIET:  Heart healthy.  SPECIAL INSTRUCTIONS: 1. No driving for 6 months.  2. Keep wound, left chest wall clean and dry.  3. Neuro outpatient rehab: PT/OT?ST beginning on September 18, 2015.  MEDICATION LIST: 1. Tylenol 325-650 mg q.4 hours as needed for mild pain. 2. Xanax 1 mg by mouth at bedtime p.r.n. anxiety #30 Rx. 3. Amlodipine 5 mg p.o. per day. 4. Apixaban 5 mg b.i.d. 5. Atorvastatin 20 mg p.o. q.p.m. 6. Tessalon Perles 10 mg b.i.d. p.r.n. cough. 7. Carvedilol 6.25 mg b.i.d. 8. Pepcid 20 mg daily. 9. Lisinopril 5 mg p.o. daily. 10.Multivitamin 1 p.o. per day. 11.Trileptal 300 mg p.o. b.i.d. 12.Senokot-S 1 p.o. b.i.d. 13.Tramadol 50 mg q.6 hours p.r.n. moderate pain #60 Rx. 14.Effexor XR 75 mg daily.  FOLLOWUP:   1. Dr. Delice Lesch in 2 weeks. 2. [facility] on September 19, 2015, at 3:30 for pacemaker check. 3. Dr. Cornelius Moras Xu.in 4-6 weeks. 4. Dr. Melinda Crutch, on March 24,2017, at 2 p.m. for post hospital followup.     Reesa Chew, P.A.   ______________________________ Delice Lesch, MD    PL/MEDQ  D:  09/17/2015  T:  09/17/2015  Job:  [id number]

## 2015-09-18 ENCOUNTER — Ambulatory Visit: Payer: BLUE CROSS/BLUE SHIELD | Attending: Family Medicine | Admitting: Occupational Therapy

## 2015-09-18 ENCOUNTER — Telehealth: Payer: Self-pay | Admitting: Physical Medicine & Rehabilitation

## 2015-09-18 ENCOUNTER — Ambulatory Visit: Payer: BLUE CROSS/BLUE SHIELD

## 2015-09-18 DIAGNOSIS — R269 Unspecified abnormalities of gait and mobility: Secondary | ICD-10-CM | POA: Insufficient documentation

## 2015-09-18 DIAGNOSIS — R41842 Visuospatial deficit: Secondary | ICD-10-CM | POA: Insufficient documentation

## 2015-09-18 DIAGNOSIS — R41841 Cognitive communication deficit: Secondary | ICD-10-CM

## 2015-09-18 DIAGNOSIS — R2689 Other abnormalities of gait and mobility: Secondary | ICD-10-CM | POA: Insufficient documentation

## 2015-09-18 DIAGNOSIS — R531 Weakness: Secondary | ICD-10-CM

## 2015-09-18 DIAGNOSIS — R2681 Unsteadiness on feet: Secondary | ICD-10-CM | POA: Insufficient documentation

## 2015-09-18 DIAGNOSIS — I69319 Unspecified symptoms and signs involving cognitive functions following cerebral infarction: Secondary | ICD-10-CM | POA: Diagnosis not present

## 2015-09-18 DIAGNOSIS — R29898 Other symptoms and signs involving the musculoskeletal system: Secondary | ICD-10-CM | POA: Diagnosis present

## 2015-09-18 DIAGNOSIS — M6281 Muscle weakness (generalized): Secondary | ICD-10-CM | POA: Diagnosis present

## 2015-09-18 DIAGNOSIS — R6889 Other general symptoms and signs: Secondary | ICD-10-CM | POA: Insufficient documentation

## 2015-09-18 NOTE — Therapy (Signed)
[facility] [address] [place], [place], [address] Phone: [phone]   Fax:  [phone]  Occupational Therapy Evaluation  Patient Details  Name: Marie Ramos MRN: [id number] Date of Birth: 04-09-1956 Referring Provider: Dr. Posey Pronto  Encounter Date: 09/18/2015      OT End of Session - 09/18/15 1141    Visit Number 1   Number of Visits 17   Date for OT Re-Evaluation 11/18/15   Authorization Type [employer]   Authorization Time Period 30 visitrs combined   Authorization - Visit Number 1   Authorization - Number of Visits 15   OT Start Time 0805   OT Stop Time 0845   OT Time Calculation (min) 40 min   Activity Tolerance Patient tolerated treatment well   Behavior During Therapy [facility] for tasks assessed/performed      Past Medical History  Diagnosis Date  . Bipolar 1 disorder ([place])   . Hypertrophic obstructive cardiomyopathy ([place]) 08/29/2015  . Cardiac arrest ([place]) 08/24/2015  . QT prolongation 08/29/2015  . TBI (traumatic brain injury) [facility]) 2011    Inpatient rehab [facility]  . Macular degeneration   . Chronically dry eyes   . Ventricular fibrillation ([place]) 09/03/15    MDT ICD Dr. Rayann Heman    Past Surgical History  Procedure Laterality Date  . Cardiac catheterization N/A 08/24/2015    Procedure: Left Heart Cath and Coronary Angiography;  Surgeon: Sherren Mocha, MD;  Location: [facility];  Service: Cardiovascular;  Laterality: N/A;  . Abdominal hysterectomy    . Tonsillectomy    . Splenectomy, total  2011  . Ep implantable device N/A 09/03/2015    MDT dual chamber ICD    There were no vitals filed for this visit.  Visit Diagnosis:  Cognitive deficits following cerebral infarction - Plan: Ot plan of care cert/re-cert  Decreased strength - Plan: Ot plan of care cert/re-cert  Decreased functional activity tolerance - Plan: Ot plan of care cert/re-cert  Visuospatial deficit - Plan: Ot plan of care  cert/re-cert      Subjective Assessment - 09/18/15 0906    Subjective  Pt s/p cardiac arrest, with intubation s/p anoxic brain injury and bilateral watershed CVA's, with hx of bipolar disorder   Pertinent History s/p recent pacemaker placement, see Epic   Patient Stated Goals to resume prior level of function   Currently in Pain? Yes   Pain Score 5    Pain Location Chest   Pain Orientation Left   Pain Descriptors / Indicators Aching   Pain Type Surgical pain;Acute pain   Pain Frequency Intermittent   Aggravating Factors  movement   Pain Relieving Factors inactivity   Multiple Pain Sites No           [facility] OT Assessment - 09/18/15 0001    Assessment   Diagnosis Anoxic brain injurys/p cardiac arrest, s/p pacemaker placement, bilateral watershed CVA   Referring Provider Dr. Posey Pronto   Onset Date 08/24/15   Assessment Pt s/p cardiac arrest 08/24/15. she was intubated . Pt s/p bilateral watershed CVAs and s/p pacemeaker placement. Pt has a hx of bipolar disorder.   Precautions   Precautions [place] expects to be discharged to: Private residence   Living Arrangements Spouse/significant other   Type of [place] Two level   Lives With Spouse   Prior Function   Level of [place] Part time  employment  substitute teacher   ADL   ADL comments supervision for all basic ADLs   IADL   Light Housekeeping Performs light daily tasks such as dishwashing, bed making   Mobility   Mobility Status --  supervision   Written Expression   Dominant Hand Left   Vision Assessment   Comment --  denies visual changes   Cognition   Overall Cognitive Status Impaired/Different from baseline   Mini Mental State Exam  MOCHA :18/30   Attention Selective   Selective Attention Appears intact  difficulties with alternating attention   Memory Impaired   Memory Impairment Decreased short term memory  unable to recall  words following delay   Sensation   Light Touch Impaired by gross assessment  LUE   Coordination   Gross Motor Movements are Fluid and Coordinated Yes   Fine Motor Movements are Fluid and Coordinated Yes   9 Hole Peg Test Right;Left   Right 9 Hole Peg Test 24.72 secs   Left 9 Hole Peg Test 27.10 secs   ROM / Strength   AROM / PROM / Strength AROM;Strength   AROM   Overall AROM  Within functional limits for tasks performed  pt reports mild limitation by discomfort on LUE s/p pacemaker   Strength   Overall Strength Deficits   Overall Strength Comments grossly WFLS for RUE, LUE grossly 3+/5-4/5 proximally   Hand Function   Right Hand Grip (lbs) 42 lbs   Left Hand Grip (lbs) 30 lbs                            OT Short Term Goals - 09/18/15 [id number]    OT SHORT TERM GOAL #1   Title I with HEP.   Time 4   Period Weeks   Status New   OT SHORT TERM GOAL #2   Title Pt will perform home management/ simple cooking at a supervision level demonstrating good safety awareness.   Time 4   Period Weeks   Status New   OT SHORT TERM GOAL #3   Title Pt will demonstrate improved activity tolerance as evidenced by performing functional activity/ home management in standing x 25 mins prior to rest break without LOB.   Time 4   Period Weeks   Status New   OT SHORT TERM GOAL #4   Title Pt will perform functional tabletop activities with a visual component with 90% or better accuracy.   Time 4   Period Weeks   Status New   OT SHORT TERM GOAL #5   Title further assess cognitive and visuospatial skills and add additional goals PRN.   Time 4   Period Weeks           OT Long Term Goals - 09/18/15 0925    OT LONG TERM GOAL #1   Title I with updated HEP.   Time 8   Period Weeks   Status New   OT LONG TERM GOAL #2   Title Pt will demonstrate ability to cook 2 items at a modified independent level simultaneously demonstrating good safety awareness.   Time 8   Period Weeks    Status New   OT LONG TERM GOAL #3   Title Pt will demonstrate ability to perform a physical and cognitive task simultaneously with 90% or better accuracy.   Time 8   Period Weeks   Status New   OT LONG TERM GOAL #4  Title Pt will demonstrate ability to navigate  a busy environment and locate items with 90% or better accuracy.   Time 8   Period Weeks   Status New   OT LONG TERM GOAL #5   Title Pt will report that she has resumed functional activities / home management in standing x 45 mins or greater prior to rest break.   Time 8   Period Weeks   Status New               Plan - 09/18/15 1136    Clinical Impression Statement Pt s/p cardiac arrest on 08/24/15 was hospitalized with anoxic brain injury, bilateral watershed CVA's and recent pacemaker placement. Pt has a PMH significant for bipolar disorder. Pt presents with decreased activity tolerance, cognitive deficits, decreased strength and visuospatial deficits. Pt can benefit from skilled occupational therapy to [place] with ADLs/ IADLS   Rehab Potential Good   OT Frequency 2x / week  plus eval   OT Duration 8 weeks   OT Treatment/Interventions Self-care/ADL training;Therapeutic exercise;Functional Mobility Training;Patient/family education;Balance training;Splinting;Manual Therapy;Neuromuscular education;Ultrasound;Energy conservation;Therapeutic exercises;Therapeutic activities;DME and/or AE instruction;Parrafin;Electrical Stimulation;Fluidtherapy;Gait Training;Cognitive remediation/compensation;Moist Heat;Contrast Bath;Passive range of motion;Visual/perceptual remediation/compensation   Plan further assess visuospatial skills, HEP for left grip strength   Consulted and Agree with Plan of Care Patient        Problem List Patient Active Problem List   Diagnosis Date Noted  . E. coli UTI 09/05/2015  . Anoxic brain injury ([place]) 09/05/2015  . Abnormality of gait   . Acute bilat watershed infarction  [facility])   . Notalgia   . Acute systolic congestive heart failure ([place])   . Benign essential HTN   . Acute lower UTI   . AKI (acute kidney injury) ([place])   . History of traumatic brain injury   . Bipolar affective disorder in remission ([place])   . Leukocytosis   . Thrombocytosis ([place])   . Cerebral infarction due to embolism of cerebral artery ([place])   . Anoxic brain damage ([facility])   . Cough   . Essential hypertension   . Systolic dysfunction with acute on chronic heart failure ([place])   . Hypertrophic obstructive cardiomyopathy ([place]) 08/29/2015  . QT prolongation 08/29/2015  . Hypokalemia 08/29/2015  . Acute respiratory failure ([place])   . Cardiac arrest ([place]) 08/24/2015    Cybil Senegal 09/18/2015, 2:32 PM Theone Murdoch, OTR/L [phone] Phone: [phone] 2:32 PM 09/18/2015  [facility] [address] [place], [place], [address] Phone: [phone]   Fax:  [phone]  Name: Marie Ramos MRN: [id number] Date of Birth: July 10, 1955

## 2015-09-18 NOTE — Telephone Encounter (Addendum)
Spoke with Patient's husband. She is doing well. Had therapy this morning. On their way home. Wants me to call back after 4:00pm.  Called back and scheduled appointment for Wednesday March 22. Patient has not had any falls. Got all medications easily after discharge.  Patient's husband is taking her to Neuro Rehab and he will call our office with any questions.

## 2015-09-19 ENCOUNTER — Encounter: Payer: Self-pay | Admitting: Cardiology

## 2015-09-19 ENCOUNTER — Ambulatory Visit (INDEPENDENT_AMBULATORY_CARE_PROVIDER_SITE_OTHER): Payer: BLUE CROSS/BLUE SHIELD | Admitting: *Deleted

## 2015-09-19 DIAGNOSIS — I469 Cardiac arrest, cause unspecified: Secondary | ICD-10-CM | POA: Diagnosis not present

## 2015-09-19 LAB — CUP PACEART INCLINIC DEVICE CHECK
Battery Remaining Longevity: 134 mo
Brady Statistic AP VP Percent: 0 %
Brady Statistic AS VS Percent: 99.95 %
Brady Statistic RA Percent Paced: 0.01 %
Brady Statistic RV Percent Paced: 0.04 %
Date Time Interrogation Session: 20170315155825
HIGH POWER IMPEDANCE MEASURED VALUE: 55 Ohm
Implantable Lead Implant Date: 20170227
Implantable Lead Location: 753860
Lead Channel Impedance Value: 418 Ohm
Lead Channel Impedance Value: 456 Ohm
Lead Channel Pacing Threshold Amplitude: 0.625 V
Lead Channel Pacing Threshold Amplitude: 0.75 V
Lead Channel Sensing Intrinsic Amplitude: 20.625 mV
Lead Channel Setting Pacing Amplitude: 3.5 V
Lead Channel Setting Pacing Amplitude: 3.5 V
Lead Channel Setting Sensing Sensitivity: 0.3 mV
MDC IDC LEAD IMPLANT DT: 20170227
MDC IDC LEAD LOCATION: 753859
MDC IDC MSMT BATTERY VOLTAGE: 3.15 V
MDC IDC MSMT LEADCHNL RA IMPEDANCE VALUE: 475 Ohm
MDC IDC MSMT LEADCHNL RA PACING THRESHOLD PULSEWIDTH: 0.4 ms
MDC IDC MSMT LEADCHNL RA SENSING INTR AMPL: 4.375 mV
MDC IDC MSMT LEADCHNL RA SENSING INTR AMPL: 4.5 mV
MDC IDC MSMT LEADCHNL RV PACING THRESHOLD PULSEWIDTH: 0.4 ms
MDC IDC MSMT LEADCHNL RV SENSING INTR AMPL: 21 mV
MDC IDC SET LEADCHNL RV PACING PULSEWIDTH: 0.4 ms
MDC IDC STAT BRADY AP VS PERCENT: 0.01 %
MDC IDC STAT BRADY AS VP PERCENT: 0.04 %

## 2015-09-19 NOTE — Progress Notes (Signed)
Wound check appointment. Steri-strips removed. Wound without redness or edema. Incision edges approximated, wound well healed. Normal device function. Thresholds, sensing, and impedances consistent with implant measurements. Device programmed at 3.5V for extra safety margin until 3 month visit. Histogram distribution appropriate for patient and level of activity. No mode switches or ventricular arrhythmias noted. Patient educated about wound care, arm mobility, lifting restrictions, shock plan. ROV in 3 months with JA. 

## 2015-09-19 NOTE — Therapy (Signed)
[facility] [address] [place], [place], [address] Phone: [phone]   Fax:  [phone]  Speech Language Pathology Evaluation  Patient Details  Name: Marie Ramos MRN: [id number] Date of Birth: [birth year] Referring Provider: Delice Lesch  Encounter Date: 09/18/2015      End of Session - 09/18/15 1445    Visit Number 1   Number of Visits 16   Date for SLP Re-Evaluation 11/19/15   SLP Start Time 0848   SLP Stop Time  0930   SLP Time Calculation (min) 42 min   Activity Tolerance Patient tolerated treatment well      Past Medical History  Diagnosis Date  . Bipolar 1 disorder ([place])   . Hypertrophic obstructive cardiomyopathy ([place]) 08/29/2015  . Cardiac arrest ([place]) 08/24/2015  . QT prolongation 08/29/2015  . TBI (traumatic brain injury) [facility]) 2011    Inpatient rehab [facility]  . Macular degeneration   . Chronically dry eyes   . Ventricular fibrillation ([place]) 09/03/15    MDT ICD Dr. Rayann Heman    Past Surgical History  Procedure Laterality Date  . Cardiac catheterization N/A 08/24/2015    Procedure: Left Heart Cath and Coronary Angiography;  Surgeon: Sherren Mocha, MD;  Location: [place] [facility] CV LAB;  Service: Cardiovascular;  Laterality: N/A;  . Abdominal hysterectomy    . Tonsillectomy    . Splenectomy, total  2011  . Ep implantable device N/A 09/03/2015    MDT dual chamber ICD    There were no vitals filed for this visit.  Visit Diagnosis: Cognitive communication deficit - Plan: SLP plan of care cert/re-cert      Subjective Assessment - 09/18/15 0851    Subjective Pt told SLP the history behind her incident seemingly without difficulty. "They're telling me I'm not as alert."   Patient is accompained by: Family member  [name], husband            SLP Evaluation [facility] - 09/18/15 0852    SLP Visit Information   SLP Received On 09/18/15   Referring Provider Delice Lesch   Onset Date 08-31-15   Medical  Diagnosis CVA (watershed)   Pain Assessment   Currently in Pain? No/denies   General Information   HPI Husband reports MD at [facility] stated that "Adderall and bipolar don't mix." and took pt off Adderall and said that may have been the reason for the CVA. Pt with previous concussion 6 years ago; pt's memory skills resolved.    Prior Functional Status   Cognitive/Linguistic Baseline Baseline deficits   Baseline deficit details mild memory deficits, pt on Adderall for attention deficits    Lives With Spouse   Education MBA   Vocation Full time employment   Pain Assessment   Pain Assessment No/denies pain   Cognition   Overall Cognitive Status Within Functional Limits for tasks assessed   Attention Selective   Selective Attention Impaired  pt req'd cues with SLP last week in selective attention   Memory Impaired   Memory Impairment Decreased short term memory   Awareness Impaired   Awareness Impairment Emergent impairment   Problem Solving Impaired   Problem Solving Impairment Verbal basic;Verbal complex;Functional complex   Air cabin crew  trail making, emergent awareness   Behaviors Impulsive   Auditory Comprehension   Overall Auditory Comprehension Appears within functional limits for tasks assessed   Verbal Expression   Overall Verbal Expression Appears within functional limits for tasks assessed   Oral Motor/Sensory Function  Overall Oral Motor/Sensory Function Appears within functional limits for tasks assessed   Motor Speech   Overall Motor Speech Appears within functional limits for tasks assessed   Standardized Assessments   Standardized Assessments  Other Assessment  Self-Administered Gerocognitive Examination   Other Assessment score= 12/22 (normal >18)                         SLP Education - 09/18/15 1444    Education provided Yes   Education Details therapy course, deficits   Person(s) Educated  Patient;Spouse   Methods Explanation   Comprehension Verbalized understanding;Need further instruction          SLP Short Term Goals - 09/18/15 1643    SLP SHORT TERM GOAL #1   Title pt will demo emergent awareness in cognitive linguistic tasks to ID 80% of errors made over three sessions   Time 4   Period Weeks   Status New   SLP SHORT TERM GOAL #2   Title selective attention for 10 minutes in mod noisy environment with mod complex cognitive linguistic task   Time 4   Period Weeks   Status New   SLP SHORT TERM GOAL #3   Title pt will use memory compensation system for answering questions during therapy    Time 4   Period Weeks   Status New   SLP SHORT TERM GOAL #4   Title pt will tell SLP 3 non-physical deficits (related to cognition) over 3 sessions   Time 4   Period Weeks   Status New          SLP Long Term Goals - 09/18/15 1644    SLP LONG TERM GOAL #1   Title Pt will demo awareness to correct 95% of errors on therapy tasks   Time 8   Period Weeks   Status New   SLP LONG TERM GOAL #2   Title pt will demo alternating attention with rare min A cues for redirection   Time 8   Period Weeks   Status New   SLP LONG TERM GOAL #3   Title pt will use memory compensations successfully PRN in 4 therapy sessions   Time 8   Period Weeks   Status New   SLP LONG TERM GOAL #4   Title pt will demo anticipatory awareness in order to use compensaations for cognitive deficits in 8 therapy sessions   Time 12   Period Weeks   Status New          Plan - 09/18/15 1445    Clinical Impression Statement Pt presents today with marked deficits in cognitive linguistics and skilled ST is recommended. Pt's anticipatory awareness was demonstrated today in that she went back to correct her cognitive assessment, but failed to ID more than 1 of her errors in it. Her score was 12/22 (WNL=18/22, and demonstrated decr'd awareness, problem solving, organization, memory, and attention. Of  note, pt was prescribed Adderall premorbidly and  is no longer taking this medication so that may be the reason for some/all of the attention deficits witnessed today.   Speech Therapy Frequency 2x / week   Duration --  12 weeks   Treatment/Interventions Cognitive reorganization;SLP instruction and feedback;Compensatory strategies;Internal/external aids;Environmental controls;Patient/family education;Functional tasks;Cueing hierarchy  all of these interventions will be used throughout therapy course.   Potential to Achieve Goals Good   Potential Considerations Ability to learn/carryover information;Severity of impairments   Consulted and Agree with Plan of  Care Patient;Family member/caregiver  husband        Problem List Patient Active Problem List   Diagnosis Date Noted  . E. coli UTI 09/05/2015  . Anoxic brain injury ([place]) 09/05/2015  . Abnormality of gait   . Acute bilat watershed infarction [facility]   . Notalgia   . Acute systolic congestive heart failure ([place])   . Benign essential HTN   . Acute lower UTI   . AKI (acute kidney injury) ([place])   . History of traumatic brain injury   . Bipolar affective disorder in remission ([place])   . Leukocytosis   . Thrombocytosis ([place])   . Cerebral infarction due to embolism of cerebral artery ([place])   . Anoxic brain damage ([facility])   . Cough   . Essential hypertension   . Systolic dysfunction with acute on chronic heart failure ([place])   . Hypertrophic obstructive cardiomyopathy ([place]) 08/29/2015  . QT prolongation 08/29/2015  . Hypokalemia 08/29/2015  . Acute respiratory failure ([place])   . Cardiac arrest ([place]) 08/24/2015    [facility] ,[place], [place]  09/19/2015, 8:36 AM  [facility] [address] [place], [place], [address] Phone: [phone]   Fax:  [phone]  Name: LAZIYAH KWIECINSKI MRN: [id number] Date of Birth: [birth year]

## 2015-09-20 ENCOUNTER — Telehealth: Payer: Self-pay | Admitting: Internal Medicine

## 2015-09-20 NOTE — Telephone Encounter (Signed)
New Message  Pt requested to speak w/ Device- would not specify nature of call. Please call back and discuss.

## 2015-09-20 NOTE — Telephone Encounter (Signed)
Spoke w/ pt and answered her questions about her monitor. Pt verbalized understanding.

## 2015-09-21 ENCOUNTER — Ambulatory Visit: Payer: BLUE CROSS/BLUE SHIELD

## 2015-09-21 ENCOUNTER — Ambulatory Visit: Payer: BLUE CROSS/BLUE SHIELD | Admitting: Occupational Therapy

## 2015-09-21 DIAGNOSIS — R29898 Other symptoms and signs involving the musculoskeletal system: Secondary | ICD-10-CM

## 2015-09-21 DIAGNOSIS — I69319 Unspecified symptoms and signs involving cognitive functions following cerebral infarction: Secondary | ICD-10-CM

## 2015-09-21 DIAGNOSIS — R41842 Visuospatial deficit: Secondary | ICD-10-CM

## 2015-09-21 DIAGNOSIS — R269 Unspecified abnormalities of gait and mobility: Secondary | ICD-10-CM

## 2015-09-21 DIAGNOSIS — R531 Weakness: Secondary | ICD-10-CM

## 2015-09-21 DIAGNOSIS — R6889 Other general symptoms and signs: Secondary | ICD-10-CM

## 2015-09-21 DIAGNOSIS — R2681 Unsteadiness on feet: Secondary | ICD-10-CM

## 2015-09-21 NOTE — Therapy (Signed)
[facility] [address] [place], [place], [address] Phone: [phone]   Fax:  [phone]  Occupational Therapy Treatment  Patient Details  Name: Marie Ramos MRN: [id number] Date of Birth: 10/01/1955 Referring Provider: Dr. Posey Pronto  Encounter Date: 09/21/2015      OT End of Session - 09/21/15 0907    Visit Number 2   Number of Visits 17   Date for OT Re-Evaluation 11/18/15   Authorization Type [employer]   Authorization Time Period 30 visitrs combined   Authorization - Visit Number 2   Authorization - Number of Visits 10  awit clarification for incurance specialist   OT Start Time 0848   OT Stop Time 0930   OT Time Calculation (min) 42 min   Activity Tolerance Patient tolerated treatment well   Behavior During Therapy [facility] for tasks assessed/performed      Past Medical History  Diagnosis Date  . Bipolar 1 disorder ([place])   . Hypertrophic obstructive cardiomyopathy ([place]) 08/29/2015  . Cardiac arrest ([place]) 08/24/2015  . QT prolongation 08/29/2015  . TBI (traumatic brain injury) [facility]) 2011    Inpatient rehab [facility]  . Macular degeneration   . Chronically dry eyes   . Ventricular fibrillation ([place]) 09/03/15    MDT ICD Dr. Rayann Heman    Past Surgical History  Procedure Laterality Date  . Cardiac catheterization N/A 08/24/2015    Procedure: Left Heart Cath and Coronary Angiography;  Surgeon: Sherren Mocha, MD;  Location: [place] [facility];  Service: Cardiovascular;  Laterality: N/A;  . Abdominal hysterectomy    . Tonsillectomy    . Splenectomy, total  2011  . Ep implantable device N/A 09/03/2015    MDT dual chamber ICD    There were no vitals filed for this visit.  Visit Diagnosis:  Cognitive deficits following cerebral infarction  Decreased strength  Decreased functional activity tolerance  Visuospatial deficit      Subjective Assessment - 09/21/15 0854    Pertinent History s/p recent pacemaker  placement, see Epic   Patient Stated Goals to resume prior level of function   Pain Score 3    Pain Location Rib cage   Pain Orientation Right;Left   Pain Descriptors / Indicators Aching   Pain Type Surgical pain   Pain Onset More than a month ago   Pain Frequency Constant   Aggravating Factors  movement   Pain Relieving Factors tylenol        Treatment: Number cancellation with 58M worksheet 100% accuracy Copying small peg design with LUE , pt was very impulsive and quick with the task despite cues to slow down Min errors and min difficulty for correct design Gripper  Set at 25 lbs for sustained grip strength, mod drops                          OT Short Term Goals - 09/18/15 [id number]    OT SHORT TERM GOAL #1   Title I with HEP.   Time 4   Period Weeks   Status New   OT SHORT TERM GOAL #2   Title Pt will perform home management/ simple cooking at a supervision level demonstrating good safety awareness.   Time 4   Period Weeks   Status New   OT SHORT TERM GOAL #3   Title Pt will demonstrate improved activity tolerance as evidenced by performing functional activity/ home management in standing x 25 mins prior to rest  break without LOB.   Time 4   Period Weeks   Status New   OT SHORT TERM GOAL #4   Title Pt will perform functional tabletop activities with a visual component with 90% or better accuracy.   Time 4   Period Weeks   Status New   OT SHORT TERM GOAL #5   Title further assess cognitive and visuospatial skills and add additional goals PRN.   Time 4   Period Weeks           OT Long Term Goals - 09/18/15 0925    OT LONG TERM GOAL #1   Title I with updated HEP.   Time 8   Period Weeks   Status New   OT LONG TERM GOAL #2   Title Pt will demonstrate ability to cook 2 items at a modified independent level simultaneously demonstrating good safety awareness.   Time 8   Period Weeks   Status New   OT LONG TERM GOAL #3   Title Pt will  demonstrate ability to perform a physical and cognitive task simultaneously with 90% or better accuracy.   Time 8   Period Weeks   Status New   OT LONG TERM GOAL #4   Title Pt will demonstrate ability to navigate  a busy environment and locate items with 90% or better accuracy.   Time 8   Period Weeks   Status New   OT LONG TERM GOAL #5   Title Pt will report that she has resumed functional activities / home management in standing x 45 mins or greater prior to rest break.   Time 8   Period Weeks   Status New               Plan - 09/21/15 0905    Clinical Impression Statement Pt is progressing towards goals. She reports performing laundry at home since last visit.   Rehab Potential Good   OT Frequency 2x / week   OT Duration 8 weeks   OT Treatment/Interventions Self-care/ADL training;Therapeutic exercise;Functional Mobility Training;Patient/family education;Balance training;Splinting;Manual Therapy;Neuromuscular education;Ultrasound;Energy conservation;Therapeutic exercises;Therapeutic activities;DME and/or AE instruction;Parrafin;Electrical Stimulation;Fluidtherapy;Gait Training;Cognitive remediation/compensation;Moist Heat;Contrast Bath;Passive range of motion;Visual/perceptual remediation/compensation   Consulted and Agree with Plan of Care Patient;Family member/caregiver   Family Member Consulted husband        Problem List Patient Active Problem List   Diagnosis Date Noted  . E. coli UTI 09/05/2015  . Anoxic brain injury ([place]) 09/05/2015  . Abnormality of gait   . Acute bilat watershed infarction [facility])   . Notalgia   . Acute systolic congestive heart failure ([place])   . Benign essential HTN   . Acute lower UTI   . AKI (acute kidney injury) ([place])   . History of traumatic brain injury   . Bipolar affective disorder in remission ([place])   . Leukocytosis   . Thrombocytosis ([place])   . Cerebral infarction due to embolism of cerebral artery ([place])   . Anoxic brain damage  ([facility])   . Cough   . Essential hypertension   . Systolic dysfunction with acute on chronic heart failure ([place])   . Hypertrophic obstructive cardiomyopathy ([place]) 08/29/2015  . QT prolongation 08/29/2015  . Hypokalemia 08/29/2015  . Acute respiratory failure ([place])   . Cardiac arrest ([place]) 08/24/2015    RINE,KATHRYN 09/21/2015, 9:10 AM Theone Murdoch, OTR/L [phone] Phone: [phone] 9:10 AM 09/21/2015 [facility] [address] [place], [place], [address] Phone: [phone]  Fax:  [phone]  Name: Marie Ramos MRN: [id number] Date of Birth: 11/16/1955

## 2015-09-21 NOTE — Patient Instructions (Signed)
1. Grip Strengthening (Resistive Putty)   Squeeze putty using thumb and all fingers. Repeat _20___ times. Do __2__ sessions per day.   2. Roll putty into tube on table and pinch between each finger and thumb x 10 reps each. (can do ring and small finger together)     Copyright  VHI. All rights reserved.   

## 2015-09-22 NOTE — Therapy (Signed)
[facility] [address] [place], [place], [address] Phone: [phone]   Fax:  [phone]  Physical Therapy Evaluation  Patient Details  Name: Marie Ramos MRN: [id number] Date of Birth: [birth year] Referring Provider: Dr. Posey Pronto  Encounter Date: 09/21/2015      PT End of Session - 09/22/15 1334    Visit Number 1   Number of Visits 9   Date for PT Re-Evaluation 10/21/15   Authorization Type BCBS (30 visit limit) emailed Lattie Haw regarding if 30 visit limit applies to all 3 disciplines.   PT Start Time 0802   PT Stop Time 0846   PT Time Calculation (min) 44 min   Equipment Utilized During Treatment Gait belt   Activity Tolerance Patient tolerated treatment well   Behavior During Therapy WFL for tasks assessed/performed      Past Medical History  Diagnosis Date  . Bipolar 1 disorder ([place])   . Hypertrophic obstructive cardiomyopathy ([place]) 08/29/2015  . Cardiac arrest ([place]) 08/24/2015  . QT prolongation 08/29/2015  . TBI (traumatic brain injury) [facility]) 2011    Inpatient rehab [name]  . Macular degeneration   . Chronically dry eyes   . Ventricular fibrillation ([place]) 09/03/15    MDT ICD Dr. Rayann Heman    Past Surgical History  Procedure Laterality Date  . Cardiac catheterization N/A 08/24/2015    Procedure: Left Heart Cath and Coronary Angiography;  Surgeon: Sherren Mocha, MD;  Location: [facility];  Service: Cardiovascular;  Laterality: N/A;  . Abdominal hysterectomy    . Tonsillectomy    . Splenectomy, total  2011  . Ep implantable device N/A 09/03/2015    MDT dual chamber ICD    There were no vitals filed for this visit.  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 Pt received PT during CIR stay. Pt reported she has been experiencing impaired balance but has been steadily improving. Pt reported she has L UE N/T, soreness in the trunk from CPR, and a cough (especially at night). Pt has appt with PCP next Friday.  Pt reported they are unsure what caused the watershed stroke, MD told them it could be from stopping her bipolar medication. Pt performed laundry but is restricted to carrying <10lbs. Pt reports she has short term memory issues.    Pertinent History Bipolar disorder, pacemaker placed in 08/2015, TBI   Patient Stated Goals Be less tired   Currently in Pain? Yes   Pain Score --  3-4/10   Pain Location Rib cage   Pain Orientation Right;Left   Pain Descriptors / Indicators Aching   Pain Type Acute pain;Surgical pain   Pain Onset More than a month ago   Pain Frequency Constant   Aggravating Factors  movement   Pain Relieving Factors Tylenol            [facility] PT Assessment - 09/21/15 0815    Assessment   Medical Diagnosis Hypoxia after cardiac arrest and watershed CVA    Referring Provider Dr. Posey Pronto   Onset Date/Surgical Date 08/24/15   Prior Therapy Inpt rehab   Precautions   Precautions Fall;ICD/Pacemaker  based on BERG score  Precaution Comments no driving   Restrictions   Weight Bearing Restrictions No   Balance Screen   Has the patient fallen in the past 6 months No   Has the patient had a decrease in activity level because of a fear of falling?  Yes   Is the patient reluctant to leave their home because of a fear of falling?  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   Coordination   Gross Motor Movements are Fluid and Coordinated Yes   Fine Motor Movements are Fluid and Coordinated Yes   Posture/Postural Control   Posture/Postural Control --   ROM / Strength   AROM / PROM / Strength AROM;Strength   AROM   Overall AROM  Within functional limits for tasks performed   Overall AROM Comments B LEs AROM WFL   Strength   Overall Strength Deficits   Overall Strength Comments Pt reported she is able to squeeze shampoo bottle with R hand vs. L hand. BLEs: grossly 4/5 except for 3+/5 B hip abd.   Transfers   Transfers Sit to Stand;Stand to Sit   Sit to Stand 6: Modified independent (Device/Increase time)   Stand to Sit 6: Modified independent (Device/Increase time)   Ambulation/Gait   Ambulation/Gait Yes   Ambulation/Gait Assistance 5: Supervision   Ambulation/Gait Assistance Details No overt LOB, but pt noted to use forward momentum to maintain balance. Pt reports she occasionally catches L foot.   Ambulation Distance (Feet) 100 Feet   Assistive device None   Gait Pattern Step-through pattern;Decreased stride length;Antalgic;Decreased trunk rotation   Ambulation Surface Level;Indoor   Gait velocity 3.25ft/sec.  none   Standardized Balance Assessment   Standardized Balance Assessment Berg Balance Test;Timed Up and Go Test    Berg Balance Test   Sit to Stand Able to stand without using hands and stabilize independently   Standing Unsupported Able to stand safely 2 minutes   Sitting with Back Unsupported but Feet Supported on Floor or Stool Able to sit safely and securely 2 minutes   Stand to Sit Sits safely with minimal use of hands   Transfers Able to transfer safely, minor use of hands   Standing Unsupported with Eyes Closed Able to stand 10 seconds with supervision   Standing Ubsupported with Feet Together Able to place feet together independently and stand for 1 minute with supervision   From Standing, Reach Forward with Outstretched Arm Can reach forward >12 cm safely (5")  7"   From Standing Position, Pick up Object from Floor  Able to pick up shoe, needs supervision   From Standing Position, Turn to Look Behind Over each Shoulder Turn sideways only but maintains balance  limited 2/2 rib pain   Turn 360 Degrees Able to turn 360 degrees safely one side only in 4 seconds or less   Standing Unsupported, Alternately Place Feet on Step/Stool Able to complete 4 steps without aid or supervision   Standing Unsupported, One Foot in Front Able to plae foot ahead of the other independently and hold 30 seconds   Standing on One Leg Able to lift leg independently and hold equal to or more than 3 seconds   Total Score 44   Timed Up and Go Test   TUG Normal TUG   Normal TUG (seconds) 7.4  none                           PT Education - 09/22/15 1333    Education provided Yes   Education Details PT discussed outcome measure results and PT duration/frequency. PT also discussed insurance 30 (all disciplines) visit limit.   Person(s) Educated Patient;Spouse   Methods Explanation   Comprehension Verbalized understanding          PT Short Term Goals - 09/22/15 1340    PT SHORT TERM GOAL #1   Title same as LTGs.           PT Long Term Goals - 09/22/15 1340    PT LONG TERM GOAL #1   Title  Pt will be IND in HEP to improve deficits listed above. Target date: 10/19/15   Status New   PT LONG TERM GOAL #2   Title Perform 6-minute walk test and write goal. Target date: 10/19/15   Status New   PT LONG TERM GOAL #3   Title Pt will improve BERG score to >/=52/56 to decr. falls risk. Target date: 10/19/15   Status New   PT LONG TERM GOAL #4   Title Pt will amb. 1000' over even/uneven terrain IND in order to improve functional mobility and to amb. at work (schools). Target date: 10/19/15   Status New   PT LONG TERM GOAL #5   Title Pt will improve Neuro QoL: LE score to >/=50 to improve quality of life. Target date: 10/19/15   Status New               Plan - 09/22/15 1336    Clinical Impression Statement Pt is a pleasant 60y/o female presenting to OPPT neuro s/o hypoxia during cardiac arrest and watershed CVA. Pt exam revealed the following deficits: gait deviations, impaired balanced, decreased endurance, decreased strength and impaired cognition. Pt's BERG score indicates pt is at risk for falls. Pt's gait speed and TUG time WNL.    Pt will benefit from skilled therapeutic intervention in order to improve on the following deficits Abnormal gait;Decreased endurance;Decreased cognition;Decreased balance;Decreased mobility;Decreased knowledge of use of DME;Decreased strength;Pain  PT will not directly treat pain but will continue to monitor closely.   Rehab Potential Good   Clinical Impairments Affecting Rehab Potential co-morbidities   PT Frequency 2x / week   PT Duration 4 weeks   PT Treatment/Interventions ADLs/Self Care Home Management;Neuromuscular re-education;Biofeedback;Electrical Stimulation;Balance training;Manual techniques;Therapeutic exercise;Therapeutic activities;Orthotic Fit/Training;Functional mobility training;Stair training;Gait training;DME Instruction;Patient/family education;Cognitive remediation   PT Next Visit Plan Perform 6-minute walk test and write goal.  Provide pt with HEP to improve hip strength and balance.   Consulted and Agree with Plan  of Care Patient;Family member/caregiver   Family Member Consulted pt's husband: [name]         Problem List Patient Active Problem List   Diagnosis Date Noted  . E. coli UTI 09/05/2015  . Anoxic brain injury ([place]) 09/05/2015  . Abnormality of gait   . Acute bilat watershed infarction [facility])   . Notalgia   . Acute systolic congestive heart failure ([place])   . Benign essential HTN   . Acute lower UTI   . AKI (acute kidney injury) ([place])   . History of traumatic brain injury   . Bipolar affective disorder in remission ([place])   . Leukocytosis   . Thrombocytosis ([place])   . Cerebral infarction due to embolism of cerebral artery ([place])   . Anoxic brain damage ([facility])   . Cough   . Essential hypertension   . Systolic dysfunction with acute on chronic heart failure ([place])   . Hypertrophic obstructive cardiomyopathy ([place]) 08/29/2015  . QT prolongation 08/29/2015  . Hypokalemia 08/29/2015  . Acute respiratory failure ([place])   . Cardiac arrest ([place]) 08/24/2015    Jaydrian Corpening L 09/22/2015, 1:45 PM  [facility] [address] [place], [place], [address] Phone: [phone]   Fax:  [phone]  Name: Marie Ramos MRN: [id number] Date of Birth: 01-25-1956    Geoffry Paradise, PT,DPT 09/22/2015 1:45 PM Phone: [phone] Fax: [phone]

## 2015-09-25 ENCOUNTER — Encounter: Payer: Self-pay | Admitting: Physical Therapy

## 2015-09-25 ENCOUNTER — Ambulatory Visit: Payer: BLUE CROSS/BLUE SHIELD | Admitting: Occupational Therapy

## 2015-09-25 ENCOUNTER — Ambulatory Visit: Payer: BLUE CROSS/BLUE SHIELD

## 2015-09-25 ENCOUNTER — Ambulatory Visit: Payer: BLUE CROSS/BLUE SHIELD | Admitting: Physical Therapy

## 2015-09-25 DIAGNOSIS — R2681 Unsteadiness on feet: Secondary | ICD-10-CM

## 2015-09-25 DIAGNOSIS — R6889 Other general symptoms and signs: Secondary | ICD-10-CM

## 2015-09-25 DIAGNOSIS — R41842 Visuospatial deficit: Secondary | ICD-10-CM

## 2015-09-25 DIAGNOSIS — I69319 Unspecified symptoms and signs involving cognitive functions following cerebral infarction: Secondary | ICD-10-CM

## 2015-09-25 DIAGNOSIS — R531 Weakness: Secondary | ICD-10-CM

## 2015-09-25 DIAGNOSIS — R269 Unspecified abnormalities of gait and mobility: Secondary | ICD-10-CM

## 2015-09-25 DIAGNOSIS — R29898 Other symptoms and signs involving the musculoskeletal system: Secondary | ICD-10-CM

## 2015-09-25 DIAGNOSIS — R41841 Cognitive communication deficit: Secondary | ICD-10-CM

## 2015-09-25 NOTE — Therapy (Signed)
[facility] [address] [place], [place], [address] Phone: [phone]   Fax:  [phone]  Physical Therapy Treatment  Patient Details  Name: Marie Ramos MRN: [id number] Date of Birth: September 04, 1955 Referring Provider: Dr. Posey Pronto  Encounter Date: 09/25/2015      PT End of Session - 09/25/15 1315    Visit Number 2   Number of Visits 9   Date for PT Re-Evaluation 10/21/15   Authorization Type BCBS (30 visit limit) emailed Lattie Haw regarding if 30 visit limit applies to all 3 disciplines.   PT Start Time 1103   PT Stop Time 1148   PT Time Calculation (min) 45 min   Equipment Utilized During Treatment Gait belt   Activity Tolerance Patient tolerated treatment well   Behavior During Therapy WFL for tasks assessed/performed      Past Medical History  Diagnosis Date  . Bipolar 1 disorder ([place])   . Hypertrophic obstructive cardiomyopathy ([place]) 08/29/2015  . Cardiac arrest ([place]) 08/24/2015  . QT prolongation 08/29/2015  . TBI (traumatic brain injury) [facility]) 2011    Inpatient rehab [facility]  . Macular degeneration   . Chronically dry eyes   . Ventricular fibrillation ([place]) 09/03/15    MDT ICD Dr. Rayann Heman    Past Surgical History  Procedure Laterality Date  . Cardiac catheterization N/A 08/24/2015    Procedure: Left Heart Cath and Coronary Angiography;  Surgeon: Sherren Mocha, MD;  Location: [facility];  Service: Cardiovascular;  Laterality: N/A;  . Abdominal hysterectomy    . Tonsillectomy    . Splenectomy, total  2011  . Ep implantable device N/A 09/03/2015    MDT dual chamber ICD    There were no vitals filed for this visit.  Visit Diagnosis:  Decreased strength  Decreased functional activity tolerance  Unsteadiness  Abnormality of gait  Weakness of both lower extremities      Subjective Assessment - 09/25/15 1106    Subjective Pt reported she has been experiencing impaired balance but has been  steadily improving. Pt reported she has L UE N/T, soreness in the trunk from CPR, and a cough (especially at night). Pt has appt with PCP Friday.  Pt reported they are unsure what caused the watershed stroke, MD told them it could be from stopping her bipolar medication. Pt performed laundry but is restricted to carrying <10lbs. Pt reports she has short term memory issues.    Pertinent History Bipolar disorder, pacemaker placed in 08/2015, TBI   Patient Stated Goals Be less tired   Currently in Pain? No/denies          [facility] PT Assessment - 09/25/15 1110    6 Minute Walk- Baseline   6 Minute Walk- Baseline yes   BP (mmHg) 150/90 mmHg   HR (bpm) 78   02 Sat (%RA) 96 %   Modified Borg Scale for Dyspnea 0- Nothing at all   Perceived Rate of Exertion (Borg) 6-   6 Minute walk- Post Test   6 Minute Walk Post Test yes   BP (mmHg) 150/90 mmHg   HR (bpm) 87   02 Sat (%RA) 96 %   Modified Borg Scale for Dyspnea 1- Very mild shortness of breath   Perceived Rate of Exertion (Borg) 12-   6 minute walk test results    Aerobic Endurance Distance Walked 955          [facility] Adult PT Treatment/Exercise - 09/25/15 1312    Neuro Re-ed  Neuro Re-ed Details  Corner exercises on pillow feet together with eyes closed with head movement up/down, left/right, diagonals with supervision. Added to HEP.   Exercises   Exercises Knee/Hip   Knee/Hip Exercises: Standing   Hip Flexion Stengthening;Right;1 set;10 reps  with 5 second holds at the top.   Knee/Hip Exercises: Prone   Hip Extension Strengthening;Both;1 set;10 reps          PT Education - 09/25/15 1637    Education provided Yes   Education Details HEP issued with corner exercises, standing hip marches with holds, and prone hip extension.   Person(s) Educated Patient   Methods Explanation;Demonstration;Handout;Verbal cues   Comprehension Verbalized understanding;Returned demonstration;Verbal cues required;Need further instruction           PT Short Term Goals - 09/22/15 1340    PT SHORT TERM GOAL #1   Title same as LTGs.          PT Long Term Goals - 09/22/15 1340    PT LONG TERM GOAL #1   Title Pt will be IND in HEP to improve deficits listed above. Target date: 10/19/15   Status New   PT LONG TERM GOAL #2   Title Perform 6-minute walk test and write goal. Target date: 10/19/15   Status New   PT LONG TERM GOAL #3   Title Pt will improve BERG score to >/=52/56 to decr. falls risk. Target date: 10/19/15   Status New   PT LONG TERM GOAL #4   Title Pt will amb. 1000' over even/uneven terrain IND in order to improve functional mobility and to amb. at work (schools). Target date: 10/19/15   Status New   PT LONG TERM GOAL #5   Title Pt will improve Neuro QoL: LE score to >/=50 to improve quality of life. Target date: 10/19/15   Status New         Plan - 09/26/15 2215    Clinical Impression Statement Today's skilled session focused on establishing pt's baseline 6 minute walk test and initiation of HEP for balance. Pt is making steady progress toward goals.   Pt will benefit from skilled therapeutic intervention in order to improve on the following deficits Abnormal gait;Decreased endurance;Decreased cognition;Decreased balance;Decreased mobility;Decreased knowledge of use of DME;Decreased strength;Pain  PT will not directly treat pain but will continue to monitor closely.   Rehab Potential Good   Clinical Impairments Affecting Rehab Potential co-morbidities   PT Frequency 2x / week   PT Duration 4 weeks   PT Treatment/Interventions ADLs/Self Care Home Management;Neuromuscular re-education;Biofeedback;Electrical Stimulation;Balance training;Manual techniques;Therapeutic exercise;Therapeutic activities;Orthotic Fit/Training;Functional mobility training;Stair training;Gait training;DME Instruction;Patient/family education;Cognitive remediation   PT Next Visit Plan PT to write 6 minute walk test goal .Check HEP issued today and  advance as needed, add hip strengthening to HEP.   Consulted and Agree with Plan of Care Patient;Family member/caregiver   Family Member Consulted pt's husband: [name]         Problem List Patient Active Problem List   Diagnosis Date Noted  . E. coli UTI 09/05/2015  . Anoxic brain injury ([place]) 09/05/2015  . Abnormality of gait   . Acute bilat watershed infarction [facility])   . Notalgia   . Acute systolic congestive heart failure ([place])   . Benign essential HTN   . Acute lower UTI   . AKI (acute kidney injury) ([place])   . History of traumatic brain injury   . Bipolar affective disorder in remission ([place])   . Leukocytosis   . Thrombocytosis (  [place])   . Cerebral infarction due to embolism of cerebral artery ([place])   . Anoxic brain damage ([facility])   . Cough   . Essential hypertension   . Systolic dysfunction with acute on chronic heart failure ([place])   . Hypertrophic obstructive cardiomyopathy ([place]) 08/29/2015  . QT prolongation 08/29/2015  . Hypokalemia 08/29/2015  . Acute respiratory failure ([place])   . Cardiac arrest [facility]) 08/24/2015    Bayard Beaver, SPTA 09/25/2015, 4:43 PM  [facility] [address] [place], [place], [address] Phone: [phone]   Fax:  [phone]  Name: RICHELLA CARAS MRN: [id number] Date of Birth: 09-01-1955  This note has been reviewed and edited by supervising CI.  Willow Ora, PTA, [facility] [address] [place], [place] [address] [phone] 09/26/2015, 10:18 PM

## 2015-09-25 NOTE — Patient Instructions (Signed)
  Please complete the assigned speech therapy homework and return it to your next session.  

## 2015-09-25 NOTE — Therapy (Signed)
[facility] [address] [place], [place], [address] Phone: [phone]   Fax:  [phone]  Speech Language Pathology Treatment  Patient Details  Name: Marie Ramos MRN: [id number] Date of Birth: 1955/07/23 Referring Provider: Delice Lesch  Encounter Date: 09/25/2015      End of Session - 09/25/15 1033    Visit Number 2   Number of Visits 16   Date for SLP Re-Evaluation 11/19/15   SLP Start Time 0935   SLP Stop Time  1016   SLP Time Calculation (min) 41 min   Activity Tolerance Patient tolerated treatment well      Past Medical History  Diagnosis Date  . Bipolar 1 disorder ([place])   . Hypertrophic obstructive cardiomyopathy ([place]) 08/29/2015  . Cardiac arrest ([place]) 08/24/2015  . QT prolongation 08/29/2015  . TBI (traumatic brain injury) [facility]) 2011    Inpatient rehab [facility]  . Macular degeneration   . Chronically dry eyes   . Ventricular fibrillation ([place]) 09/03/15    MDT ICD Dr. Rayann Heman    Past Surgical History  Procedure Laterality Date  . Cardiac catheterization N/A 08/24/2015    Procedure: Left Heart Cath and Coronary Angiography;  Surgeon: Sherren Mocha, MD;  Location: [facility];  Service: Cardiovascular;  Laterality: N/A;  . Abdominal hysterectomy    . Tonsillectomy    . Splenectomy, total  2011  . Ep implantable device N/A 09/03/2015    MDT dual chamber ICD    There were no vitals filed for this visit.  Visit Diagnosis: Cognitive communication deficit      Subjective Assessment - 09/25/15 0937    Subjective Pt brought three ring binder with her.   Patient is accompained by: --  husband   Currently in Pain? No/denies               ADULT SLP TREATMENT - 09/25/15 0946    General Information   Behavior/Cognition Alert;Cooperative;Pleasant mood   Pain Assessment   Pain Assessment No/denies pain   Cognitive-Linquistic Treatment   Treatment focused on Cognition   Skilled  Treatment Pt arrived with 3-ring binder today and asked SLP if he could provide dividers. In a reasoning task, targeted due to pt's impulsivity and decr'd attention, pt demonstrated limited emergent awareness, this task took pt 16 minutes and she stated that was too long for her (intellectual awareness). SLP req'd to provide mod A usually. SLP assessing pt's attention throughout the session - she needed to look back at written directions at least once for 75% of the directions.    Assessment / Recommendations / Plan   Plan Continue with current plan of care   Progression Toward Goals   Progression toward goals Progressing toward goals          SLP Education - 09/25/15 1032    Education provided Yes   Education Details slow down in order to improve accuracy with tasks, reduced attention skills   Person(s) Educated Patient;Child(ren);Spouse   Methods Explanation;Demonstration   Comprehension Verbalized understanding;Need further instruction          SLP Short Term Goals - 09/25/15 1035    SLP SHORT TERM GOAL #1   Title pt will demo emergent awareness in cognitive linguistic tasks to ID 80% of errors made over three sessions   Time 4   Period Weeks   Status On-going   SLP SHORT TERM GOAL #2   Title selective attention for 10 minutes in mod noisy environment  with mod complex cognitive linguistic task   Time 4   Period Weeks   Status On-going   SLP SHORT TERM GOAL #3   Title pt will use memory compensation system for answering questions during therapy    Time 4   Period Weeks   Status On-going   SLP SHORT TERM GOAL #4   Title pt will tell SLP 3 non-physical deficits (related to cognition) over 3 sessions   Time 4   Period Weeks   Status On-going          SLP Long Term Goals - 09/25/15 1035    SLP LONG TERM GOAL #1   Title Pt will demo awareness to correct 95% of errors on therapy tasks   Time 8   Period Weeks   Status On-going   SLP LONG TERM GOAL #2   Title pt will  demo alternating attention with rare min A cues for redirection   Time 8   Period Weeks   Status On-going   SLP LONG TERM GOAL #3   Title pt will use memory compensations successfully PRN in 4 therapy sessions   Time 8   Period Weeks   Status On-going   SLP LONG TERM GOAL #4   Title pt will demo anticipatory awareness in order to use compensaations for cognitive deficits in 8 therapy sessions   Time 12   Period Weeks   Status On-going          Plan - 09/25/15 1033    Clinical Impression Statement Deficits in cognitive linguistics cont, and skilled ST cont'd recommended. Pt would benefit from cont'd skilled ST to assist pt in her possible return to work, and to reduce caregiver burden.   Speech Therapy Frequency 2x / week   Duration --  12 weeks   Treatment/Interventions Cognitive reorganization;SLP instruction and feedback;Compensatory strategies;Internal/external aids;Environmental controls;Patient/family education;Functional tasks;Cueing hierarchy  all of these interventions will be used throughout therapy course.   Potential to Achieve Goals Good   Potential Considerations Ability to learn/carryover information;Severity of impairments   Consulted and Agree with Plan of Care Patient;Family member/caregiver  husband        Problem List Patient Active Problem List   Diagnosis Date Noted  . E. coli UTI 09/05/2015  . Anoxic brain injury ([place]) 09/05/2015  . Abnormality of gait   . Acute bilat watershed infarction [facility])   . Notalgia   . Acute systolic congestive heart failure ([place])   . Benign essential HTN   . Acute lower UTI   . AKI (acute kidney injury) ([place])   . History of traumatic brain injury   . Bipolar affective disorder in remission ([place])   . Leukocytosis   . Thrombocytosis ([place])   . Cerebral infarction due to embolism of cerebral artery ([place])   . Anoxic brain damage ([facility])   . Cough   . Essential hypertension   . Systolic dysfunction with acute on chronic  heart failure ([place])   . Hypertrophic obstructive cardiomyopathy ([place]) 08/29/2015  . QT prolongation 08/29/2015  . Hypokalemia 08/29/2015  . Acute respiratory failure ([place])   . Cardiac arrest ([place]) 08/24/2015    [facility] ,[place], [place]  09/25/2015, 10:36 AM  [facility] [address] [place], [place], [address] Phone: [phone]   Fax:  [phone]   Name: ROSEMARIE ROHR MRN: [id number] Date of Birth: 1955-08-19

## 2015-09-25 NOTE — Patient Instructions (Signed)
Feet Together (Compliant Surface) Head Motion - Eyes Closed    Stand on compliant surface: ____Pillows____ with feet together. Close eyes and move head slowly: 1. Up and down. 2. Left and Right 3. Diagonally up and right/down and left 4. Diagonally up and left/down and right Repeat __10__ times per session. Do _1-2___ sessions per day.  Copyright  VHI. All rights reserved.  "I love a Parade" Lift    Using upper extremity support if necessary, march along counter, holding the knee at the top for 5 seconds each time down and back. Repeat __2__ times. Do __1-2__ sessions per day.  http://gt2.exer.us/345   Copyright  VHI. All rights reserved.  Straight Leg Raise (Prone)    Abdomen and head supported, keep left knee locked and raise leg at hip. Avoid arching low back. Repeat other leg. Repeat __10__ times per set. Do __1__ sets per session. Do __1-2__ sessions per day.  http://orth.exer.us/1112   Copyright  VHI. All rights reserved.

## 2015-09-25 NOTE — Therapy (Signed)
[facility] [address] [place], [place], [address] Phone: [phone]   Fax:  [phone]  Occupational Therapy Treatment  Patient Details  Name: Marie Ramos MRN: [id number] Date of Birth: 1956-04-26 Referring Provider: Dr. Posey Pronto  Encounter Date: 09/25/2015      OT End of Session - 09/25/15 1040    Visit Number 3   Number of Visits 17   Date for OT Re-Evaluation 11/18/15   Authorization Time Period 30 visitrs combined   Authorization - Visit Number 3   Authorization - Number of Visits 10   OT Start Time 1021   OT Stop Time 1100   OT Time Calculation (min) 39 min   Activity Tolerance Patient tolerated treatment well   Behavior During Therapy [facility] for tasks assessed/performed      Past Medical History  Diagnosis Date  . Bipolar 1 disorder ([place])   . Hypertrophic obstructive cardiomyopathy ([place]) 08/29/2015  . Cardiac arrest ([place]) 08/24/2015  . QT prolongation 08/29/2015  . TBI (traumatic brain injury) [facility]) 2011    Inpatient rehab [facility]  . Macular degeneration   . Chronically dry eyes   . Ventricular fibrillation ([place]) 09/03/15    MDT ICD Dr. Rayann Heman    Past Surgical History  Procedure Laterality Date  . Cardiac catheterization N/A 08/24/2015    Procedure: Left Heart Cath and Coronary Angiography;  Surgeon: Sherren Mocha, MD;  Location: [facility];  Service: Cardiovascular;  Laterality: N/A;  . Abdominal hysterectomy    . Tonsillectomy    . Splenectomy, total  2011  . Ep implantable device N/A 09/03/2015    MDT dual chamber ICD    There were no vitals filed for this visit.  Visit Diagnosis:  Cognitive deficits following cerebral infarction  Decreased strength  Decreased functional activity tolerance  Visuospatial deficit      Subjective Assessment - 09/25/15 1035    Subjective  denies pain   Pertinent History s/p recent pacemaker placement, see Epic, no lifting over 10 lbs   Patient  Stated Goals to resume prior level of function   Currently in Pain? No/denies            [facility] OT Assessment - 09/25/15 0001    Precautions   Precautions Fall;ICD/Pacemaker   Precaution Comments no driving, no lifting over 10 lbs           Pipe tree design for increased problem solving, attention to detail 3/3 trials with 100% accuracy. Pt made 1 error but she self corrected. Organizing your day task # 2 with significantly increased time required, pt did not complete task fully and therefore pt was issued task to complete as home work. Pt required increased time and min v.c. initially for organizing task.                  OT Short Term Goals - 09/18/15 [id number]    OT SHORT TERM GOAL #1   Title I with HEP.   Time 4   Period Weeks   Status New   OT SHORT TERM GOAL #2   Title Pt will perform home management/ simple cooking at a supervision level demonstrating good safety awareness.   Time 4   Period Weeks   Status New   OT SHORT TERM GOAL #3   Title Pt will demonstrate improved activity tolerance as evidenced by performing functional activity/ home management in standing x 25 mins prior to rest break without LOB.   Time  4   Period Weeks   Status New   OT SHORT TERM GOAL #4   Title Pt will perform functional tabletop activities with a visual component with 90% or better accuracy.   Time 4   Period Weeks   Status New   OT SHORT TERM GOAL #5   Title further assess cognitive and visuospatial skills and add additional goals PRN.   Time 4   Period Weeks           OT Long Term Goals - 09/18/15 0925    OT LONG TERM GOAL #1   Title I with updated HEP.   Time 8   Period Weeks   Status New   OT LONG TERM GOAL #2   Title Pt will demonstrate ability to cook 2 items at a modified independent level simultaneously demonstrating good safety awareness.   Time 8   Period Weeks   Status New   OT LONG TERM GOAL #3   Title Pt will demonstrate ability to perform a  physical and cognitive task simultaneously with 90% or better accuracy.   Time 8   Period Weeks   Status New   OT LONG TERM GOAL #4   Title Pt will demonstrate ability to navigate  a busy environment and locate items with 90% or better accuracy.   Time 8   Period Weeks   Status New   OT LONG TERM GOAL #5   Title Pt will report that she has resumed functional activities / home management in standing x 45 mins or greater prior to rest break.   Time 8   Period Weeks   Status New               Plan - 09/25/15 1038    Clinical Impression Statement Pt is progressing towards goals. She demonstrates improved attention to detail and demonstrated improved ability to slow down.(ST reviewed importance of slowing down with pt. just prior to OT session)   Rehab Potential Good   OT Duration 8 weeks   OT Treatment/Interventions Self-care/ADL training;Therapeutic exercise;Functional Mobility Training;Patient/family education;Balance training;Splinting;Manual Therapy;Neuromuscular education;Ultrasound;Energy conservation;Therapeutic exercises;Therapeutic activities;DME and/or AE instruction;Parrafin;Electrical Stimulation;Fluidtherapy;Gait Training;Cognitive remediation/compensation;Moist Heat;Contrast Bath;Passive range of motion;Visual/perceptual remediation/compensation   Plan cognitive retraining   Consulted and Agree with Plan of Care Patient;Family member/caregiver        Problem List Patient Active Problem List   Diagnosis Date Noted  . E. coli UTI 09/05/2015  . Anoxic brain injury ([place]) 09/05/2015  . Abnormality of gait   . Acute bilat watershed infarction [facility])   . Notalgia   . Acute systolic congestive heart failure ([place])   . Benign essential HTN   . Acute lower UTI   . AKI (acute kidney injury) ([place])   . History of traumatic brain injury   . Bipolar affective disorder in remission ([place])   . Leukocytosis   . Thrombocytosis ([place])   . Cerebral infarction due to embolism of  cerebral artery ([place])   . Anoxic brain damage ([facility])   . Cough   . Essential hypertension   . Systolic dysfunction with acute on chronic heart failure ([place])   . Hypertrophic obstructive cardiomyopathy ([place]) 08/29/2015  . QT prolongation 08/29/2015  . Hypokalemia 08/29/2015  . Acute respiratory failure ([place])   . Cardiac arrest ([place]) 08/24/2015    [facility] 09/25/2015, 10:41 AM Theone Murdoch, OTR/L [phone] Phone: [phone] 10:41 AM 09/25/2015 [facility] [address] [place], [place], [address] Phone:  [phone]   Fax:  [phone]  Name: Marie Ramos MRN: [id number] Date of Birth: 1956-02-03

## 2015-09-26 ENCOUNTER — Encounter
Payer: BLUE CROSS/BLUE SHIELD | Attending: Physical Medicine & Rehabilitation | Admitting: Physical Medicine & Rehabilitation

## 2015-09-26 ENCOUNTER — Other Ambulatory Visit: Payer: Self-pay | Admitting: *Deleted

## 2015-09-26 ENCOUNTER — Encounter: Payer: Self-pay | Admitting: Physical Medicine & Rehabilitation

## 2015-09-26 VITALS — BP 149/82 | HR 75 | Resp 14

## 2015-09-26 DIAGNOSIS — R05 Cough: Secondary | ICD-10-CM | POA: Diagnosis not present

## 2015-09-26 DIAGNOSIS — F319 Bipolar disorder, unspecified: Secondary | ICD-10-CM | POA: Diagnosis not present

## 2015-09-26 DIAGNOSIS — R52 Pain, unspecified: Secondary | ICD-10-CM | POA: Diagnosis present

## 2015-09-26 DIAGNOSIS — R200 Anesthesia of skin: Secondary | ICD-10-CM | POA: Insufficient documentation

## 2015-09-26 DIAGNOSIS — R531 Weakness: Secondary | ICD-10-CM | POA: Diagnosis not present

## 2015-09-26 DIAGNOSIS — I10 Essential (primary) hypertension: Secondary | ICD-10-CM | POA: Diagnosis not present

## 2015-09-26 DIAGNOSIS — Z8674 Personal history of sudden cardiac arrest: Secondary | ICD-10-CM | POA: Diagnosis not present

## 2015-09-26 DIAGNOSIS — I638 Other cerebral infarction: Secondary | ICD-10-CM

## 2015-09-26 DIAGNOSIS — F419 Anxiety disorder, unspecified: Secondary | ICD-10-CM | POA: Diagnosis not present

## 2015-09-26 DIAGNOSIS — R062 Wheezing: Secondary | ICD-10-CM | POA: Insufficient documentation

## 2015-09-26 DIAGNOSIS — Z8673 Personal history of transient ischemic attack (TIA), and cerebral infarction without residual deficits: Secondary | ICD-10-CM | POA: Diagnosis present

## 2015-09-26 DIAGNOSIS — H04129 Dry eye syndrome of unspecified lacrimal gland: Secondary | ICD-10-CM | POA: Insufficient documentation

## 2015-09-26 DIAGNOSIS — R059 Cough, unspecified: Secondary | ICD-10-CM

## 2015-09-26 DIAGNOSIS — G931 Anoxic brain damage, not elsewhere classified: Secondary | ICD-10-CM

## 2015-09-26 DIAGNOSIS — F3170 Bipolar disorder, currently in remission, most recent episode unspecified: Secondary | ICD-10-CM | POA: Diagnosis not present

## 2015-09-26 DIAGNOSIS — R269 Unspecified abnormalities of gait and mobility: Secondary | ICD-10-CM | POA: Insufficient documentation

## 2015-09-26 DIAGNOSIS — I4901 Ventricular fibrillation: Secondary | ICD-10-CM | POA: Diagnosis not present

## 2015-09-26 DIAGNOSIS — G3184 Mild cognitive impairment, so stated: Secondary | ICD-10-CM | POA: Insufficient documentation

## 2015-09-26 DIAGNOSIS — S51802S Unspecified open wound of left forearm, sequela: Secondary | ICD-10-CM

## 2015-09-26 DIAGNOSIS — S069X0A Unspecified intracranial injury without loss of consciousness, initial encounter: Secondary | ICD-10-CM | POA: Insufficient documentation

## 2015-09-26 DIAGNOSIS — H3530 Unspecified macular degeneration: Secondary | ICD-10-CM | POA: Diagnosis not present

## 2015-09-26 DIAGNOSIS — F411 Generalized anxiety disorder: Secondary | ICD-10-CM

## 2015-09-26 DIAGNOSIS — I6389 Other cerebral infarction: Secondary | ICD-10-CM

## 2015-09-26 MED ORDER — TRAMADOL HCL 50 MG PO TABS: 50.0000 mg | ORAL_TABLET | Freq: Two times a day (BID) | ORAL | Status: DC | PRN

## 2015-09-26 MED ORDER — ALPRAZOLAM 0.25 MG PO TABS: 0.2500 mg | ORAL_TABLET | Freq: Every evening | ORAL | Status: DC | PRN

## 2015-09-26 MED ORDER — BENZONATATE 100 MG PO CAPS: 100.0000 mg | ORAL_CAPSULE | Freq: Every day | ORAL | Status: DC

## 2015-09-26 NOTE — Progress Notes (Signed)
Subjective:    Patient ID: Marie Ramos, female    DOB: 29-Mar-1956, 60 y.o.   MRN: [id number]  HPI  [name] is a 60 year old right- handed female with history of bipolar disorder with recent increase in psychiatric medications who presents for transitional care management after being IRF stay.   DATE OF ADMISSION: 09/05/2015 DATE OF DISCHARGE: 09/14/2015 Pt had a witnessed cardiac arrest and resulting weakness, abnormality of gait, and cognitive dysfunction secondary to Cardiac arrest with hypoxic encephalopathy and embolic/watershed stroke.  Since discharge, she has been doing well.  She continues to have some difficulty sleeping due to soreness of chest wall.  She fatigues at the end of therapy sessions.    DME: She is using borrowed DME Ambulation: No assistive device Therapies: PT/OT/ST   Pain Inventory Average Pain 7 Pain Right Now no selection My pain is dull and tingling  In the last 24 hours, has pain interfered with the following? General activity no selection Relation with others no selection Enjoyment of life no selection What TIME of day is your pain at its worst? evening Sleep (in general) Fair  Pain is worse with: walking, bending and sleep Pain improves with: no selection Relief from Meds: no selection  Mobility walk without assistance how many minutes can you walk? 6 ability to climb steps?  yes do you drive?  no  Function not employed: date last employed . disabled: date disabled .  Neuro/Psych numbness  Prior Studies x-rays CT/MRI hospital f/u  Physicians involved in your care hospital f/u   Family History  Problem Relation Age of Onset  . Cancer Mother   . COPD Father    Social History   Social History  . Marital Status: Married    Spouse Name: N/A  . Number of Children: N/A  . Years of Education: N/A   Social History Main Topics  . Smoking status: Never Smoker   . Smokeless tobacco: Never Used  . Alcohol  Use: No  . Drug Use: No  . Sexual Activity: Not Asked   Other Topics Concern  . None   Social History Narrative   Past Surgical History  Procedure Laterality Date  . Cardiac catheterization N/A 08/24/2015    Procedure: Left Heart Cath and Coronary Angiography;  Surgeon: Sherren Mocha, MD;  Location: [facility];  Service: Cardiovascular;  Laterality: N/A;  . Abdominal hysterectomy    . Tonsillectomy    . Splenectomy, total  2011  . Ep implantable device N/A 09/03/2015    MDT dual chamber ICD   Past Medical History  Diagnosis Date  . Bipolar 1 disorder ([place])   . Hypertrophic obstructive cardiomyopathy ([place]) 08/29/2015  . Cardiac arrest ([place]) 08/24/2015  . QT prolongation 08/29/2015  . TBI (traumatic brain injury) [facility]) 2011    Inpatient rehab [facility]  . Macular degeneration   . Chronically dry eyes   . Ventricular fibrillation ([place]) 09/03/15    MDT ICD Dr. Rayann Heman   BP 149/82 mmHg  Pulse 75  Resp 14  SpO2 95%  Opioid Risk Score:   Fall Risk Score:  `1  Depression screen PHQ 2/9  Depression screen PHQ 2/9 09/26/2015  Decreased Interest 0  Down, Depressed, Hopeless 0  PHQ - 2 Score 0  Altered sleeping 3  Tired, decreased energy 1  Change in appetite 0  Feeling bad or failure about yourself  0  Trouble concentrating 2  Moving slowly or fidgety/restless 0  Suicidal thoughts 0  PHQ-9 Score 6  Difficult doing work/chores Not difficult at all   Review of Systems  Respiratory: Positive for cough and wheezing.   Musculoskeletal: Negative for myalgias and back pain.  Neurological: Negative for dizziness, speech difficulty, weakness and numbness.  All other systems reviewed and are negative.     Objective:   Physical Exam Constitutional: She appears well-developed and well-nourished. NAD. Vital signs reviewed.  HENT: Normocephalic and atraumatic.  Eyes: Conjunctivae and EOM are normal.  Cardiovascular: Normal rate and regular rhythm.No murmur  heard. Respiratory: Effort normal and breath sounds normal.  GI: Soft. Bowel sounds are normal. She exhibits no distension. There is no tenderness.  Musculoskeletal: She exhibits no edema or tenderness.  Neurological: She is alert and oriented.  Able to follow simple commands.  Motor:  RUE: 5/5 proximal to distal LUE: 5/5 proximal to distal LLE: Hip flexion 5/5, knee extension 5/5, ankle dorsi/plantarflexion 5/5  RLE: Hip flexion 5/5, knee extension 5/5, ankle dorsi/plantarflexion 5/5  Skin: Ulcer left forearm Psychiatric: Anxious behavior.  She has a normal mood and affect.     Assessment & Plan:  [name] is a 60 year old right- handed female with history of bipolar disorder with recent increase in psychiatric medications who presents for transitional care management after being IRF stay.    1.  Emolic watershed stroke with mild anoxic brain injury  Eliquis  Cont therapies  Follow up with Neurology  2. Hypertension.  Cont meds  Pt's PCP may discontinue if community BPs are improved  3.  Bipolar disease  Cont meds  Pt to have follow up with Psych 10/16/2015  4. Cough at night  Changed timing of Pepcid  Will refill tesslon pearls for time being  Recommend evaluation for possible GERD  5. Wound left forearm  Improving in size and characters  Cont topic ointment, will need to consider abx and change in dressing if area becomes worse  6. Anxiety  Will refill meds  7. S/p cardiac arrest  Will cont tramadol and wean  Encouraged use of heat/ice   Meds reviewed with PT Questions answered No need for assistive devices or DME at present

## 2015-09-27 ENCOUNTER — Ambulatory Visit: Payer: BLUE CROSS/BLUE SHIELD

## 2015-09-27 ENCOUNTER — Ambulatory Visit: Payer: BLUE CROSS/BLUE SHIELD | Admitting: Occupational Therapy

## 2015-09-27 ENCOUNTER — Ambulatory Visit: Payer: BLUE CROSS/BLUE SHIELD | Admitting: Speech Pathology

## 2015-09-27 DIAGNOSIS — I69319 Unspecified symptoms and signs involving cognitive functions following cerebral infarction: Secondary | ICD-10-CM

## 2015-09-27 DIAGNOSIS — R29898 Other symptoms and signs involving the musculoskeletal system: Secondary | ICD-10-CM

## 2015-09-27 DIAGNOSIS — R41841 Cognitive communication deficit: Secondary | ICD-10-CM

## 2015-09-27 DIAGNOSIS — R41842 Visuospatial deficit: Secondary | ICD-10-CM

## 2015-09-27 DIAGNOSIS — R2681 Unsteadiness on feet: Secondary | ICD-10-CM

## 2015-09-27 DIAGNOSIS — R269 Unspecified abnormalities of gait and mobility: Secondary | ICD-10-CM

## 2015-09-27 NOTE — Therapy (Signed)
Marie Ramos [address] [place], [place], [address] Phone: [phone]   Fax:  [phone]  Occupational Therapy Treatment  Patient Details  Name: Marie Ramos MRN: [id number] Date of Birth: 09-15-1955 Referring Provider: Dr. Posey Pronto  Encounter Date: 09/27/2015      OT End of Session - 09/27/15 1317    Visit Number 4   Number of Visits 17   Date for OT Re-Evaluation 11/18/15   Authorization Type BCBS   Authorization Time Period 30 visitrs combined   Authorization - Visit Number 4   Authorization - Number of Visits 10   OT Start Time 0930   OT Stop Time 1015   OT Time Calculation (min) 45 min   Activity Tolerance Patient tolerated treatment well      Past Medical History  Diagnosis Date  . Bipolar 1 disorder ([address])   . Hypertrophic obstructive cardiomyopathy ([place]) 08/29/2015  . Cardiac arrest ([place]) 08/24/2015  . QT prolongation 08/29/2015  . TBI (traumatic brain injury) [facility]) 2011    Inpatient rehab [facility]  . Macular degeneration   . Chronically dry eyes   . Ventricular fibrillation ([place]) 09/03/15    MDT ICD Dr. Rayann Heman    Past Surgical History  Procedure Laterality Date  . Cardiac catheterization N/A 08/24/2015    Procedure: Left Heart Cath and Coronary Angiography;  Surgeon: Sherren Mocha, MD;  Location: [facility];  Service: Cardiovascular;  Laterality: N/A;  . Abdominal hysterectomy    . Tonsillectomy    . Splenectomy, total  2011  . Ep implantable device N/A 09/03/2015    MDT dual chamber ICD    There were no vitals filed for this visit.  Visit Diagnosis:  Cognitive deficits following cerebral infarction  Visuospatial deficit      Subjective Assessment - 09/27/15 0934    Subjective  My husband looked over my homework and told me to correct a few things   Pertinent History s/p recent pacemaker placement, see Epic, no lifting over 10 lbs   Patient Stated Goals to resume prior level of  function   Currently in Pain? No/denies                      OT Treatments/Exercises (OP) - 09/27/15 0001    Cognitive Exercises   Problem Solving Reading a schedule and filling in "to do" list within available timeframes and answering corresponding questions for problem solving, attention to detail with extra time required, but filling remainder of schedule out appropriately demo problem solving skills. Pt then answered corresponding questions with 2/11 errors (9/11 correct). Pt required questioning cues/prompts to recognize correct season   Organization Checked homework from last session (complex "organizing your day" task) - pt reports it took her longer and she had to go over it multiple times with husband rechecking. Pt make 2 self corrected errors and did not allow sufficient time for 2 items but otherwise correct. Pt completing simplified "organizing your day" task in clinic with 100% accuracy   Visual/Perceptual Exercises   Other Exercises pt completing simple 24 pc. simple jigsaw puzzle for visual/perceptual skills, developing strategies and problem solving. Pt required min to mod v.c's cues to use external visual aid (picture of the puzzle), develop strategies, orient puzzle pieces correctly, and slow down. After cueing, pt finished puzzle with greater independence                  OT Short Term Goals - 09/18/15  [place] #1   Title I with HEP.   Time 4   Period Weeks   Status New   OT SHORT TERM GOAL #2   Title Pt will perform home management/ simple cooking at a supervision level demonstrating good safety awareness.   Time 4   Period Weeks   Status New   OT SHORT TERM GOAL #3   Title Pt will demonstrate improved activity tolerance as evidenced by performing functional activity/ home management in standing x 25 mins prior to rest break without LOB.   Time 4   Period Weeks   Status New   OT SHORT TERM GOAL #4   Title Pt will perform  functional tabletop activities with a visual component with 90% or better accuracy.   Time 4   Period Weeks   Status New   OT SHORT TERM GOAL #5   Title further assess cognitive and visuospatial skills and add additional goals PRN.   Time 4   Period Weeks           OT Long Term Goals - 09/18/15 0925    OT LONG TERM GOAL #1   Title I with updated HEP.   Time 8   Period Weeks   Status New   OT LONG TERM GOAL #2   Title Pt will demonstrate ability to cook 2 items at a modified independent level simultaneously demonstrating good safety awareness.   Time 8   Period Weeks   Status New   OT LONG TERM GOAL #3   Title Pt will demonstrate ability to perform a physical and cognitive task simultaneously with 90% or better accuracy.   Time 8   Period Weeks   Status New   OT LONG TERM GOAL #4   Title Pt will demonstrate ability to navigate  a busy environment and locate items with 90% or better accuracy.   Time 8   Period Weeks   Status New   OT LONG TERM GOAL #5   Title Pt will report that she has resumed functional activities / home management in standing x 45 mins or greater prior to rest break.   Time 8   Period Weeks   Status New               Plan - 09/27/15 1318    Clinical Impression Statement Pt demo decreased selective and alternating attention and needs to be re-directed at times. Pt also impulsive and requires cues to slow down. Pt struggles with higher level organizational and problem solving skills   Plan simplified and complex (as able) planning activity, visual/perceptual skills (chinese puzzle), set STG #5 (Following session: cooking task for standing endurance/cognition, env'tal scanning)   Consulted and Agree with Plan of Care Patient;Family member/caregiver   Family Member Consulted husband        Problem List Patient Active Problem List   Diagnosis Date Noted  . E. coli UTI 09/05/2015  . Anoxic brain injury ([place]) 09/05/2015  . Abnormality of  gait   . Acute bilat watershed infarction [facility])   . Notalgia   . Acute systolic congestive heart failure ([place])   . Benign essential HTN   . Acute lower UTI   . AKI (acute kidney injury) ([place])   . History of traumatic brain injury   . Bipolar affective disorder in remission ([place])   . Leukocytosis   . Thrombocytosis ([place])   . Cerebral infarction due to embolism of cerebral  artery ([place])   . Anoxic brain damage ([facility])   . Cough   . Essential hypertension   . Systolic dysfunction with acute on chronic heart failure ([place])   . Hypertrophic obstructive cardiomyopathy ([place]) 08/29/2015  . QT prolongation 08/29/2015  . Hypokalemia 08/29/2015  . Acute respiratory failure ([place])   . Cardiac arrest ([place]) 08/24/2015    Carey Bullocks, OTR/L 09/27/2015, 1:21 PM  [facility] [address] [place], [place], [address] Phone: [phone]   Fax:  [phone]  Name: Marie Ramos MRN: [id number] Date of Birth: 1955/12/21

## 2015-09-27 NOTE — Therapy (Signed)
[facility] [address] [place], [place], [address] Phone: [phone]   Fax:  [phone]  Physical Therapy Treatment  Patient Details  Name: Marie Ramos MRN: [id number] Date of Birth: 10/30/1955 Referring Provider: Dr. Posey Pronto  Encounter Date: 09/27/2015      PT End of Session - 09/27/15 1223    Visit Number 3   Number of Visits 9   Date for PT Re-Evaluation 10/21/15   Authorization Type BCBS (30 visit limit) emailed Lattie Haw regarding if 30 visit limit applies to all 3 disciplines.   PT Start Time [phone]   PT Stop Time 0927   PT Time Calculation (min) 40 min   Equipment Utilized During Treatment --  utilized min guard to S for safety, no gait belt 2/2 pt's reported rib pain   Activity Tolerance Patient tolerated treatment well   Behavior During Therapy WFL for tasks assessed/performed      Past Medical History  Diagnosis Date  . Bipolar 1 disorder ([place])   . Hypertrophic obstructive cardiomyopathy ([place]) 08/29/2015  . Cardiac arrest ([place]) 08/24/2015  . QT prolongation 08/29/2015  . TBI (traumatic brain injury) [facility]) 2011    Inpatient rehab [facility]  . Macular degeneration   . Chronically dry eyes   . Ventricular fibrillation ([place]) 09/03/15    MDT ICD Dr. Rayann Heman    Past Surgical History  Procedure Laterality Date  . Cardiac catheterization N/A 08/24/2015    Procedure: Left Heart Cath and Coronary Angiography;  Surgeon: Sherren Mocha, MD;  Location: [facility];  Service: Cardiovascular;  Laterality: N/A;  . Abdominal hysterectomy    . Tonsillectomy    . Splenectomy, total  2011  . Ep implantable device N/A 09/03/2015    MDT dual chamber ICD    There were no vitals filed for this visit.  Visit Diagnosis:  Abnormality of gait  Unsteadiness  Weakness of both lower extremities      Subjective Assessment - 09/27/15 0850    Subjective Pt denied falls since last visit. Pt reported she experienced L  knee pain after and during HEP last visit. Pt was told she has OA in L knee.    Patient is accompained by: Family member  Marie Ramos   Pertinent History Bipolar disorder, pacemaker placed in 08/2015, TBI   Patient Stated Goals Be less tired   Currently in Pain? Yes   Pain Score 4    Pain Location Rib cage   Pain Orientation Left;Right   Pain Descriptors / Indicators Aching   Pain Type Surgical pain   Pain Radiating Towards back pain   Pain Onset More than a month ago   Pain Frequency Constant   Aggravating Factors  movement   Pain Relieving Factors Tylenol          Therex: Standing hip ext, at counter with UE support. Cues for technique. Please see pt instructions for details.  Neuro re-ed: Pt performed standing balance exercises at counter with 0-2 UE support and S for safety. Please see pt instructions and OTAGO for details. Pt reported two seated rest breaks 2/2 L knee pain (during first 10 minutes of session), but pain then decreased.                    Balance Exercises - 09/27/15 0921    OTAGO PROGRAM   Knee Bends 10 reps, no support   Backwards Walking Support   Walking and Turning Around No assistive device  Sideways Walking No assistive device   Tandem Walk No support  intermittent support   One Leg Stand 10 seconds, no support  intermittent support   Heel Walking Support   Toe Walk Support   Sit to Stand 10 reps, no support   Overall OTAGO Comments PT reviewed HEP, and modified prn. PT added            PT Education - 09/27/15 1222    Education provided Yes   Education Details PT reviewed previous HEP and had pt perform HEP, and modified as needed to decr. L knee pain. PT also discussed pt using heat on L knee (10 minutes) prior to HEP and ice on L knee (10-15 minutes) after performing HEP to decr. pain, as MD told pt she can use either heat or ice.    Person(s) Educated Patient;Spouse   Methods Explanation;Demonstration;Verbal cues;Handout    Comprehension Returned demonstration;Verbalized understanding          PT Short Term Goals - 09/22/15 1340    PT SHORT TERM GOAL #1   Title same as LTGs.           PT Long Term Goals - 09/27/15 1226    PT LONG TERM GOAL #1   Title Pt will be IND in HEP to improve deficits listed above. Target date: 10/19/15   Status On-going   PT LONG TERM GOAL #2   Title Perform 6-minute walk test and write goal. Target date: 10/19/15   Status Achieved   PT LONG TERM GOAL #3   Title Pt will improve BERG score to >/=52/56 to decr. falls risk. Target date: 10/19/15   Status On-going   PT LONG TERM GOAL #4   Title Pt will amb. 1000' over even/uneven terrain IND in order to improve functional mobility and to amb. at work (schools). Target date: 10/19/15   Status On-going   PT LONG TERM GOAL #5   Title Pt will improve Neuro QoL: LE score to >/=50 to improve quality of life. Target date: 10/19/15   Status On-going   Additional Long Term Goals   Additional Long Term Goals Yes   PT LONG TERM GOAL #6   Title Pt will perform 6-minute walk test with >/= 1155' in order to improve endurance. Target date: 10/19/15   Status New               Plan - 09/27/15 1223    Clinical Impression Statement Pt modified L hip extension HEP from prone to standing, to decrease pt's reported rib and L knee pain. Pt demonstrated progress, as she was able to tolerate incr. reps of OTAGO HEP provided by previous PT. Continue with POC.    Pt will benefit from skilled therapeutic intervention in order to improve on the following deficits Abnormal gait;Decreased endurance;Decreased cognition;Decreased balance;Decreased mobility;Decreased knowledge of use of DME;Decreased strength;Pain   Rehab Potential Good   Clinical Impairments Affecting Rehab Potential co-morbidities   PT Frequency 2x / week   PT Duration 4 weeks   PT Treatment/Interventions ADLs/Self Care Home Management;Neuromuscular  re-education;Biofeedback;Electrical Stimulation;Balance training;Manual techniques;Therapeutic exercise;Therapeutic activities;Orthotic Fit/Training;Functional mobility training;Stair training;Gait training;DME Instruction;Patient/family education;Cognitive remediation   PT Next Visit Plan SLS activities and B hip abd. strengthening.   PT Home Exercise Plan Balance and strengthening.   Consulted and Agree with Plan of Care Patient;Family member/caregiver   Family Member Consulted pt's husband: Marie Ramos        Problem List Patient Active Problem List   Diagnosis Date Noted  .  E. coli UTI 09/05/2015  . Anoxic brain injury ([place]) 09/05/2015  . Abnormality of gait   . Acute bilat watershed infarction [facility])   . Notalgia   . Acute systolic congestive heart failure ([place])   . Benign essential HTN   . Acute lower UTI   . AKI (acute kidney injury) ([place])   . History of traumatic brain injury   . Bipolar affective disorder in remission ([place])   . Leukocytosis   . Thrombocytosis ([place])   . Cerebral infarction due to embolism of cerebral artery ([place])   . Anoxic brain damage ([facility])   . Cough   . Essential hypertension   . Systolic dysfunction with acute on chronic heart failure ([place])   . Hypertrophic obstructive cardiomyopathy ([place]) 08/29/2015  . QT prolongation 08/29/2015  . Hypokalemia 08/29/2015  . Acute respiratory failure ([place])   . Cardiac arrest ([place]) 08/24/2015    Jaysen Wey L 09/27/2015, 12:29 PM  [facility] [address] [place], [place], [address] Phone: [phone]   Fax:  [phone]  Name: Marie Ramos MRN: [id number] Date of Birth: 1955/07/25    Geoffry Paradise, PT,DPT 09/27/2015 12:29 PM Phone: [phone] Fax: [phone]

## 2015-09-27 NOTE — Patient Instructions (Signed)
Feet Together (Compliant Surface) Head Motion - Eyes Closed    Stand on compliant surface: ____Pillows____ with feet together. Close eyes and move head slowly: 1. Up and down. 2. Left and Right Repeat __10__ times per session. Do _1-2___ sessions per day.  Copyright  VHI. All rights reserved.   "I love a Parade" Lift    Using upper extremity support if necessary, march along counter, holding the knee at the top for 5 seconds each time down and back. Repeat __2__ times. Do __1-2__ sessions per day.  http://gt2.exer.us/345   Copyright  VHI. All rights reserved.  Straight Leg Raise (Prone)    Abdomen and head supported, keep left knee locked and raise leg at hip. Avoid arching low back. Repeat other leg. Repeat __10__ times per set. Do __1__ sets per session. Do __1-2__ sessions per day. STOP THIS EXERCISE!! http://orth.exer.us/1112   Copyright  VHI. All rights reserved.   EXTENSION: Standing (Active)    Stand, both feet flat. Draw right leg behind body as far as possible. Then repeat with other leg. Complete _3__ sets of _10__ repetitions. Perform _3-4__ sessions per week.  http://gtsc.exer.us/77   Copyright  VHI. All rights reserved.

## 2015-09-27 NOTE — Patient Instructions (Signed)
  Cognitive Activities you can do at home:   - Solitaire  - Majong  - Scrabble  - Chess/Checkers  - Crosswords (easy level)  - Uno  - Card Games  - Board Games  - Connect 4  - Simon  - the Memory Game  On your computer, tablet or phone: BrainHQ Brainbashers.com Neuronation App Memory Match Game App Rush Hour Chocolate Fix Sort it out Scrabble pics App Cracker Barrel App Photo Quiz App MixTwo App What's the Word?App  

## 2015-09-27 NOTE — Therapy (Signed)
[facility] [address] [place], [place], [address] Phone: [phone]   Fax:  [phone]  Speech Language Pathology Treatment  Patient Details  Name: Marie Ramos MRN: [id number] Date of Birth: [birth year] Referring Provider: Delice Lesch  Encounter Date: 09/27/2015      End of Session - 09/27/15 1157    Visit Number 3   Number of Visits 16   Date for SLP Re-Evaluation 11/19/15   Authorization Type 30 visit limit combined   SLP Start Time 1103   SLP Stop Time  1147   SLP Time Calculation (min) 44 min      Past Medical History  Diagnosis Date  . Bipolar 1 disorder ([place])   . Hypertrophic obstructive cardiomyopathy ([place]) 08/29/2015  . Cardiac arrest ([place]) 08/24/2015  . QT prolongation 08/29/2015  . TBI (traumatic brain injury) [facility]) 2011    Inpatient rehab [facility]  . Macular degeneration   . Chronically dry eyes   . Ventricular fibrillation ([place]) 09/03/15    MDT ICD Dr. Rayann Heman    Past Surgical History  Procedure Laterality Date  . Cardiac catheterization N/A 08/24/2015    Procedure: Left Heart Cath and Coronary Angiography;  Surgeon: Sherren Mocha, MD;  Location: [facility];  Service: Cardiovascular;  Laterality: N/A;  . Abdominal hysterectomy    . Tonsillectomy    . Splenectomy, total  2011  . Ep implantable device N/A 09/03/2015    MDT dual chamber ICD    There were no vitals filed for this visit.  Visit Diagnosis: Cognitive communication deficit      Subjective Assessment - 09/27/15 1112    Subjective Pt handed in homework with out cues,    Currently in Pain? No/denies               ADULT SLP TREATMENT - 09/27/15 1114    General Information   Behavior/Cognition Alert;Cooperative;Pleasant mood   Pain Assessment   Pain Assessment No/denies pain   Cognitive-Linquistic Treatment   Treatment focused on Cognition   Skilled Treatment Pt. utilized notebook to look up and tell ST  why she is receiving rehab with mod I. She answered questions re: calendar and schedule with mod I.  Pt required usual min A to selectively attend to cogntivie linguistic task and recall directions of tasks. Pt alternated attention between conversation and simple deduction task with occasional min A.           SLP Education - 09/27/15 1153    Education provided Yes   Education Details attention and awareness deficits   Person(s) Educated Patient   Methods Explanation;Demonstration;Verbal cues   Comprehension Verbalized understanding;Need further instruction          SLP Short Term Goals - 09/27/15 1156    SLP SHORT TERM GOAL #1   Title pt will demo emergent awareness in cognitive linguistic tasks to ID 80% of errors made over three sessions   Time 4   Period Weeks   Status On-going   SLP SHORT TERM GOAL #2   Title selective attention for 10 minutes in mod noisy environment with mod complex cognitive linguistic task   Time 4   Period Weeks   Status On-going   SLP SHORT TERM GOAL #3   Title pt will use memory compensation system for answering questions during therapy    Time 4   Period Weeks   Status On-going   SLP SHORT TERM GOAL #4   Title pt will tell  SLP 3 non-physical deficits (related to cognition) over 3 sessions   Time 4   Period Weeks   Status On-going          SLP Long Term Goals - 09/27/15 1157    SLP LONG TERM GOAL #1   Title Pt will demo awareness to correct 95% of errors on therapy tasks   Time 8   Period Weeks   Status On-going   SLP LONG TERM GOAL #2   Title pt will demo alternating attention with rare min A cues for redirection   Time 8   Period Weeks   Status On-going   SLP LONG TERM GOAL #3   Title pt will use memory compensations successfully PRN in 4 therapy sessions   Time 8   Period Weeks   Status On-going   SLP LONG TERM GOAL #4   Title pt will demo anticipatory awareness in order to use compensaations for cognitive deficits in 8  therapy sessions   Time 12   Period Weeks   Status On-going          Plan - 09/27/15 1156    Clinical Impression Statement Deficits in cognitive linguistics cont, and skilled ST cont'd recommended. Pt would benefit from cont'd skilled ST to assist pt in her possible return to work, and to reduce caregiver burden.   Speech Therapy Frequency 2x / week   Treatment/Interventions Cognitive reorganization;SLP instruction and feedback;Compensatory strategies;Internal/external aids;Environmental controls;Patient/family education;Functional tasks;Cueing hierarchy   Potential to Achieve Goals Good   Potential Considerations Ability to learn/carryover information;Severity of impairments   Consulted and Agree with Plan of Care Patient;Family member/caregiver        Problem List Patient Active Problem List   Diagnosis Date Noted  . E. coli UTI 09/05/2015  . Anoxic brain injury ([place]) 09/05/2015  . Abnormality of gait   . Acute bilat watershed infarction [facility])   . Notalgia   . Acute systolic congestive heart failure ([place])   . Benign essential HTN   . Acute lower UTI   . AKI (acute kidney injury) ([place])   . History of traumatic brain injury   . Bipolar affective disorder in remission ([place])   . Leukocytosis   . Thrombocytosis ([place])   . Cerebral infarction due to embolism of cerebral artery ([place])   . Anoxic brain damage ([facility])   . Cough   . Essential hypertension   . Systolic dysfunction with acute on chronic heart failure ([place])   . Hypertrophic obstructive cardiomyopathy ([place]) 08/29/2015  . QT prolongation 08/29/2015  . Hypokalemia 08/29/2015  . Acute respiratory failure ([place])   . Cardiac arrest ([place]) 08/24/2015    Lovvorn, Annye Rusk MS, CCC-SLP 09/27/2015, 11:58 AM  [facility] [address] [place], [place], [address] Phone: [phone]   Fax:  [phone]   Name: MERCEDE ODENTHAL MRN: [id number] Date of Birth:  1955-07-29

## 2015-10-03 ENCOUNTER — Ambulatory Visit: Payer: BLUE CROSS/BLUE SHIELD | Admitting: Occupational Therapy

## 2015-10-03 ENCOUNTER — Encounter: Payer: Self-pay | Admitting: Physical Therapy

## 2015-10-03 ENCOUNTER — Ambulatory Visit: Payer: BLUE CROSS/BLUE SHIELD

## 2015-10-03 ENCOUNTER — Ambulatory Visit: Payer: BLUE CROSS/BLUE SHIELD | Admitting: Physical Therapy

## 2015-10-03 DIAGNOSIS — R29898 Other symptoms and signs involving the musculoskeletal system: Secondary | ICD-10-CM

## 2015-10-03 DIAGNOSIS — R269 Unspecified abnormalities of gait and mobility: Secondary | ICD-10-CM

## 2015-10-03 DIAGNOSIS — R41842 Visuospatial deficit: Secondary | ICD-10-CM

## 2015-10-03 DIAGNOSIS — I69319 Unspecified symptoms and signs involving cognitive functions following cerebral infarction: Secondary | ICD-10-CM | POA: Diagnosis not present

## 2015-10-03 DIAGNOSIS — R531 Weakness: Secondary | ICD-10-CM

## 2015-10-03 DIAGNOSIS — R6889 Other general symptoms and signs: Secondary | ICD-10-CM

## 2015-10-03 DIAGNOSIS — R2681 Unsteadiness on feet: Secondary | ICD-10-CM

## 2015-10-03 DIAGNOSIS — R41841 Cognitive communication deficit: Secondary | ICD-10-CM

## 2015-10-03 NOTE — Therapy (Signed)
[facility] [address] [place], [place], [address] Phone: [phone]   Fax:  [phone]  Occupational Therapy Treatment  Patient Details  Name: Marie Ramos MRN: [id number] Date of Birth: Apr 11, 1956 Referring Provider: Dr. Posey Pronto  Encounter Date: 10/03/2015    Past Medical History  Diagnosis Date  . Bipolar 1 disorder ([place])   . Hypertrophic obstructive cardiomyopathy ([place]) 08/29/2015  . Cardiac arrest ([place]) 08/24/2015  . QT prolongation 08/29/2015  . TBI (traumatic brain injury) [facility]) 2011    Inpatient rehab [facility]  . Macular degeneration   . Chronically dry eyes   . Ventricular fibrillation ([place]) 09/03/15    MDT ICD Dr. Rayann Heman    Past Surgical History  Procedure Laterality Date  . Cardiac catheterization N/A 08/24/2015    Procedure: Left Heart Cath and Coronary Angiography;  Surgeon: Sherren Mocha, MD;  Location: [facility];  Service: Cardiovascular;  Laterality: N/A;  . Abdominal hysterectomy    . Tonsillectomy    . Splenectomy, total  2011  . Ep implantable device N/A 09/03/2015    MDT dual chamber ICD    There were no vitals filed for this visit.  Visit Diagnosis:  Decreased strength  Visuospatial deficit  Cognitive deficits following cerebral infarction      Subjective Assessment - 10/03/15 0935    Subjective  Pt reports knee pain   Pertinent History s/p recent pacemaker placement, see Epic, no lifting over 10 lbs   Patient Stated Goals to resume prior level of function   Pain Score 5    Pain Location Knee   Pain Orientation Left   Pain Descriptors / Indicators Aching   Pain Type Acute pain   Pain Onset More than a month ago   Pain Frequency Constant   Aggravating Factors  movement   Pain Relieving Factors tylenol                 Copying  Mod complex  peg design with LUE for increased fine motor coordination and visual perceptual skills in a mod distracting  environment, pt completed with 100% accuracy. Complex organizational task to plan breakfast for 14 people: max difficulty, with significant increased time, difficulty with organizational strategy.Pt calculated beverages for all 14 people and spent most of her budget without buying food.  Therapist will have pt attempt simple organizational task next visit               OT Short Term Goals - 10/03/15 0947    OT SHORT TERM GOAL #1   Title I with HEP.   Time 4   Period Weeks   Status On-going   OT SHORT TERM GOAL #2   Title Pt will perform home management/ simple cooking at a supervision level demonstrating good safety awareness.   Time 4   Period Weeks   Status On-going   OT SHORT TERM GOAL #3   Title Pt will demonstrate improved activity tolerance as evidenced by performing functional activity/ home management in standing x 25 mins prior to rest break without LOB.   Time 4   Period Weeks   Status New   OT SHORT TERM GOAL #4   Title Pt will perform functional tabletop activities with a visual component with 90% or better accuracy.   Time 4   Period Weeks   Status On-going   OT SHORT TERM GOAL #5   Title Pt will perform a mod complex organizational task with 95% or better accuracy  in prep for return to work.   Time 4   Period Weeks   Status New           OT Long Term Goals - 09/18/15 [id number]    OT LONG TERM GOAL #1   Title I with updated HEP.   Time 8   Period Weeks   Status New   OT LONG TERM GOAL #2   Title Pt will demonstrate ability to cook 2 items at a modified independent level simultaneously demonstrating good safety awareness.   Time 8   Period Weeks   Status New   OT LONG TERM GOAL #3   Title Pt will demonstrate ability to perform a physical and cognitive task simultaneously with 90% or better accuracy.   Time 8   Period Weeks   Status New   OT LONG TERM GOAL #4   Title Pt will demonstrate ability to navigate  a busy environment and locate items with  90% or better accuracy.   Time 8   Period Weeks   Status New   OT LONG TERM GOAL #5   Title Pt will report that she has resumed functional activities / home management in standing x 45 mins or greater prior to rest break.   Time 8   Period Weeks   Status New               Plan - 10/03/15 0945    Clinical Impression Statement Pt is progressing towards goals for visual perceptual skills and selective attention.   Rehab Potential Good   OT Frequency 2x / week   OT Duration 8 weeks   OT Treatment/Interventions Self-care/ADL training;Therapeutic exercise;Functional Mobility Training;Patient/family education;Balance training;Splinting;Manual Therapy;Neuromuscular education;Ultrasound;Energy conservation;Therapeutic exercises;Therapeutic activities;DME and/or AE instruction;Parrafin;Electrical Stimulation;Fluidtherapy;Gait Training;Cognitive remediation/compensation;Moist Heat;Contrast Bath;Passive range of motion;Visual/perceptual remediation/compensation   Plan continue to address cognition and visual perceptual skills   Consulted and Agree with Plan of Care Patient        Problem List Patient Active Problem List   Diagnosis Date Noted  . E. coli UTI 09/05/2015  . Anoxic brain injury ([place]) 09/05/2015  . Abnormality of gait   . Acute bilat watershed infarction [facility])   . Notalgia   . Acute systolic congestive heart failure ([place])   . Benign essential HTN   . Acute lower UTI   . AKI (acute kidney injury) ([place])   . History of traumatic brain injury   . Bipolar affective disorder in remission ([place])   . Leukocytosis   . Thrombocytosis ([place])   . Cerebral infarction due to embolism of cerebral artery ([place])   . Anoxic brain damage ([facility])   . Cough   . Essential hypertension   . Systolic dysfunction with acute on chronic heart failure ([place])   . Hypertrophic obstructive cardiomyopathy ([place]) 08/29/2015  . QT prolongation 08/29/2015  . Hypokalemia 08/29/2015  . Acute respiratory  failure ([place])   . Cardiac arrest ([employer]) 08/24/2015    RINE,KATHRYN 10/03/2015, 10:08 AM Theone Murdoch, OTR/L [phone] Phone: [phone] 10:08 AM 10/03/2015 [facility] [address] [place], [place], [address] Phone: [phone]   Fax:  [phone]  Name: NAZARET ARRELLANO MRN: [id number] Date of Birth: 1956-01-25

## 2015-10-03 NOTE — Therapy (Signed)
[facility] [address] [place], [place], [address] Phone: [phone]   Fax:  [phone]  Physical Therapy Treatment  Patient Details  Name: Marie Ramos MRN: [id number] Date of Birth: 12-Apr-1956 Referring Provider: Dr. Posey Pronto  Encounter Date: 10/03/2015      PT End of Session - 10/03/15 0938    Visit Number 4   Number of Visits 9   Date for PT Re-Evaluation 10/21/15   Authorization Type [employer] (30 visit limit) emailed Lattie Haw regarding if 30 visit limit applies to all 3 disciplines.   PT Start Time [phone]   PT Stop Time 0930   PT Time Calculation (min) 40 min   Equipment Utilized During Treatment Gait belt   Activity Tolerance Patient tolerated treatment well   Behavior During Therapy WFL for tasks assessed/performed      Past Medical History  Diagnosis Date  . Bipolar 1 disorder ([place])   . Hypertrophic obstructive cardiomyopathy ([place]) 08/29/2015  . Cardiac arrest ([place]) 08/24/2015  . QT prolongation 08/29/2015  . TBI (traumatic brain injury) [facility]) 2011    Inpatient rehab [facility]  . Macular degeneration   . Chronically dry eyes   . Ventricular fibrillation ([place]) 09/03/15    MDT ICD Dr. Rayann Heman    Past Surgical History  Procedure Laterality Date  . Cardiac catheterization N/A 08/24/2015    Procedure: Left Heart Cath and Coronary Angiography;  Surgeon: Sherren Mocha, MD;  Location: [facility];  Service: Cardiovascular;  Laterality: N/A;  . Abdominal hysterectomy    . Tonsillectomy    . Splenectomy, total  2011  . Ep implantable device N/A 09/03/2015    MDT dual chamber ICD    There were no vitals filed for this visit.  Visit Diagnosis:  Decreased strength  Abnormality of gait  Unsteadiness  Weakness of both lower extremities  Decreased functional activity tolerance      Subjective Assessment - 10/03/15 0854    Subjective Pt denied falls since last visit. Pt reported she experienced decreased  L knee pain after HEP modifications last visit. Pt was told she has OA in L knee. Patient reports walking at home more, taking breaks when fatigued.   Patient is accompained by: Family member  [name]   Pertinent History Bipolar disorder, pacemaker placed in 08/2015, TBI   Patient Stated Goals Be less tired   Currently in Pain? Yes   Pain Score 4   After Tylenol   Pain Location Knee   Pain Orientation Left   Pain Onset More than a month ago          Balance Exercises - 10/03/15 0934    OTAGO PROGRAM   Knee Bends 10 reps, no support   Walking and Turning Around No assistive device   Sideways Walking No assistive device   Tandem Walk Support   One Leg Stand 10 seconds, no support   Heel Walking Support   Toe Walk Support   Sit to Stand 10 reps, no support   Stair Walking 4 Stairs x 2 up/down.   Overall OTAGO Comments Reviewed HEP established last session with occasional verbal cues for correct form. Patient quick to correct and maintain good form.      Therapeutic Exercise: Standing marches down/back counter with 5" holds at top. Standing hip extension x 10 reps each leg with verbal cues for proper form; patient quick to correct and maintain.  NMR: Corner exercises from HEP - Feet together, eyes closed, on compliant  surface head movement up/down, left/right x 10 reps each direction with verbal cues for proper form with supervision; no LOB noted.       PT Short Term Goals - 09/22/15 1340    PT SHORT TERM GOAL #1   Title same as LTGs.          PT Long Term Goals - 09/27/15 1226    PT LONG TERM GOAL #1   Title Pt will be IND in HEP to improve deficits listed above. Target date: 10/19/15   Status On-going   PT LONG TERM GOAL #2   Title Perform 6-minute walk test and write goal. Target date: 10/19/15   Status Achieved   PT LONG TERM GOAL #3   Title Pt will improve BERG score to >/=52/56 to decr. falls risk. Target date: 10/19/15   Status On-going   PT LONG TERM GOAL #4    Title Pt will amb. 1000' over even/uneven terrain IND in order to improve functional mobility and to amb. at work (schools). Target date: 10/19/15   Status On-going   PT LONG TERM GOAL #5   Title Pt will improve Neuro QoL: LE score to >/=50 to improve quality of life. Target date: 10/19/15   Status On-going   Additional Long Term Goals   Additional Long Term Goals Yes   PT LONG TERM GOAL #6   Title Pt will perform 6-minute walk test with >/= 1155' in order to improve endurance. Target date: 10/19/15   Status New          Plan - 10/03/15 0939    Clinical Impression Statement Reviewed HEP established last session with minor cues to correct form. Patient quick to correct and maintain good form. Patient reports some limitation by knee pain normally but did not interfere with exercise within session. Ice given to patient for L knee upon patient request during OT session as patient has been instructed to ice knee after exercise. Patient is making steady progress toward goals.   Pt will benefit from skilled therapeutic intervention in order to improve on the following deficits Abnormal gait;Decreased endurance;Decreased cognition;Decreased balance;Decreased mobility;Decreased knowledge of use of DME;Decreased strength;Pain   Rehab Potential Good   Clinical Impairments Affecting Rehab Potential co-morbidities   PT Frequency 2x / week   PT Duration 4 weeks   PT Treatment/Interventions ADLs/Self Care Home Management;Neuromuscular re-education;Biofeedback;Electrical Stimulation;Balance training;Manual techniques;Therapeutic exercise;Therapeutic activities;Orthotic Fit/Training;Functional mobility training;Stair training;Gait training;DME Instruction;Patient/family education;Cognitive remediation   PT Next Visit Plan SLS activities and B hip abd. strengthening.   PT Home Exercise Plan Balance and strengthening.    Consulted and Agree with Plan of Care Patient;Family member/caregiver   Family Member  Consulted pt's husband: [name]      Problem List Patient Active Problem List   Diagnosis Date Noted  . E. coli UTI 09/05/2015  . Anoxic brain injury ([place]) 09/05/2015  . Abnormality of gait   . Acute bilat watershed infarction [facility])   . Notalgia   . Acute systolic congestive heart failure ([place])   . Benign essential HTN   . Acute lower UTI   . AKI (acute kidney injury) ([place])   . History of traumatic brain injury   . Bipolar affective disorder in remission ([place])   . Leukocytosis   . Thrombocytosis ([place])   . Cerebral infarction due to embolism of cerebral artery ([place])   . Anoxic brain damage ([facility])   . Cough   . Essential hypertension   . Systolic dysfunction with acute on chronic  heart failure ([place])   . Hypertrophic obstructive cardiomyopathy ([place]) 08/29/2015  . QT prolongation 08/29/2015  . Hypokalemia 08/29/2015  . Acute respiratory failure ([place])   . Cardiac arrest [facility]) 08/24/2015    Bayard Beaver, S[place] 10/03/2015, 9:58 AM  [facility] [address] [place], [place], [address] Phone: [phone]   Fax:  [phone]  Name: Marie Ramos MRN: [id number] Date of Birth: [birth year]  This note has been reviewed and edited by supervising CI.  Willow Ora, PTA, [facility] [address] [place], [place] [address] [phone] 10/03/2015, 3:56 PM

## 2015-10-03 NOTE — Therapy (Signed)
[facility] [address] [place], [place], [address] Phone: [phone]   Fax:  [phone]  Speech Language Pathology Treatment  Patient Details  Name: Marie Ramos MRN: [id number] Date of Birth: [birth year] Referring Provider: Delice Lesch  Encounter Date: 10/03/2015      End of Session - 10/03/15 1058    Visit Number 4   Number of Visits 16   Date for SLP Re-Evaluation 11/19/15   Authorization Type 30 visit limit combined   Authorization - Number of Visits 30   SLP Start Time 1018   SLP Stop Time  1100   SLP Time Calculation (min) 42 min   Activity Tolerance Patient tolerated treatment well      Past Medical History  Diagnosis Date  . Bipolar 1 disorder ([place])   . Hypertrophic obstructive cardiomyopathy ([place]) 08/29/2015  . Cardiac arrest ([place]) 08/24/2015  . QT prolongation 08/29/2015  . TBI (traumatic brain injury) [facility]) 2011    Inpatient rehab [facility]  . Macular degeneration   . Chronically dry eyes   . Ventricular fibrillation ([place]) 09/03/15    MDT ICD Dr. Rayann Heman    Past Surgical History  Procedure Laterality Date  . Cardiac catheterization N/A 08/24/2015    Procedure: Left Heart Cath and Coronary Angiography;  Surgeon: Sherren Mocha, MD;  Location: [facility];  Service: Cardiovascular;  Laterality: N/A;  . Abdominal hysterectomy    . Tonsillectomy    . Splenectomy, total  2011  . Ep implantable device N/A 09/03/2015    MDT dual chamber ICD    There were no vitals filed for this visit.  Visit Diagnosis: Cognitive communication deficit      Subjective Assessment - 10/03/15 1031    Subjective Pt brought homework out first thing. "I'm having trouble with putting things together."               ADULT SLP TREATMENT - 10/03/15 1049    General Information   Behavior/Cognition Alert;Cooperative;Pleasant mood   Pain Assessment   Pain Assessment No/denies pain   Cognitive-Linquistic  Treatment   Treatment focused on Cognition   Skilled Treatment SLP facilitated pt's improved cognitive linguistic skills by reviewing her homework with her. Initially she req'd min A usually, then faded to occasionally. SLP assisted pt most with reasoning and with alternating attention.    Assessment / Recommendations / Plan   Plan Continue with current plan of care   Progression Toward Goals   Progression toward goals Progressing toward goals            SLP Short Term Goals - 10/03/15 1059    SLP SHORT TERM GOAL #1   Title pt will demo emergent awareness in cognitive linguistic tasks to ID 80% of errors made over three sessions   Time 3   Period Weeks   Status On-going   SLP SHORT TERM GOAL #2   Title selective attention for 10 minutes in mod noisy environment with mod complex cognitive linguistic task   Time 3   Period Weeks   Status On-going   SLP SHORT TERM GOAL #3   Title pt will use memory compensation system for answering questions during therapy    Time 3   Period Weeks   Status On-going   SLP SHORT TERM GOAL #4   Title pt will tell SLP 3 non-physical deficits (related to cognition) over 3 sessions   Time 3   Period Weeks   Status On-going  SLP Long Term Goals - 10/03/15 1059    SLP LONG TERM GOAL #1   Title Pt will demo awareness to correct 95% of errors on therapy tasks   Time 7   Period Weeks   Status On-going   SLP LONG TERM GOAL #2   Title pt will demo alternating attention with rare min A cues for redirection   Time 7   Period Weeks   Status On-going   SLP LONG TERM GOAL #3   Title pt will use memory compensations successfully PRN in 4 therapy sessions   Time 7   Period Weeks   Status On-going   SLP LONG TERM GOAL #4   Title pt will demo anticipatory awareness in order to use compensaations for cognitive deficits in 8 therapy sessions   Time 11   Period Weeks   Status On-going          Plan - 10/03/15 1059    Clinical Impression  Statement Deficits in cognitive linguistics cont, and skilled ST cont'd recommended. Pt would benefit from cont'd skilled ST to assist pt in her possible return to work, and to reduce caregiver burden.   Speech Therapy Frequency 2x / week   Duration --  11 weeks   Treatment/Interventions Cognitive reorganization;SLP instruction and feedback;Compensatory strategies;Internal/external aids;Environmental controls;Patient/family education;Functional tasks;Cueing hierarchy   Potential to Achieve Goals Good   Potential Considerations Ability to learn/carryover information;Severity of impairments   Consulted and Agree with Plan of Care Patient;Family member/caregiver        Problem List Patient Active Problem List   Diagnosis Date Noted  . E. coli UTI 09/05/2015  . Anoxic brain injury ([place]) 09/05/2015  . Abnormality of gait   . Acute bilat watershed infarction [facility])   . Notalgia   . Acute systolic congestive heart failure ([place])   . Benign essential HTN   . Acute lower UTI   . AKI (acute kidney injury) ([place])   . History of traumatic brain injury   . Bipolar affective disorder in remission ([place])   . Leukocytosis   . Thrombocytosis ([place])   . Cerebral infarction due to embolism of cerebral artery ([place])   . Anoxic brain damage ([facility])   . Cough   . Essential hypertension   . Systolic dysfunction with acute on chronic heart failure ([place])   . Hypertrophic obstructive cardiomyopathy ([place]) 08/29/2015  . QT prolongation 08/29/2015  . Hypokalemia 08/29/2015  . Acute respiratory failure ([place])   . Cardiac arrest ([place]) 08/24/2015    [facility] ,[place], [place]  10/03/2015, 11:00 AM  [facility] [address] [place], [place], [address] Phone: [phone]   Fax:  [phone]   Name: Marie Ramos MRN: [id number] Date of Birth: 11-24-1955

## 2015-10-03 NOTE — Patient Instructions (Signed)
  Please complete the assigned speech therapy homework and return it to your next session.  

## 2015-10-05 ENCOUNTER — Ambulatory Visit: Payer: BLUE CROSS/BLUE SHIELD | Admitting: Occupational Therapy

## 2015-10-05 ENCOUNTER — Ambulatory Visit: Payer: BLUE CROSS/BLUE SHIELD

## 2015-10-05 DIAGNOSIS — I69319 Unspecified symptoms and signs involving cognitive functions following cerebral infarction: Secondary | ICD-10-CM | POA: Diagnosis not present

## 2015-10-05 DIAGNOSIS — R2689 Other abnormalities of gait and mobility: Secondary | ICD-10-CM

## 2015-10-05 DIAGNOSIS — R41842 Visuospatial deficit: Secondary | ICD-10-CM

## 2015-10-05 DIAGNOSIS — R41841 Cognitive communication deficit: Secondary | ICD-10-CM

## 2015-10-05 NOTE — Therapy (Signed)
[facility] [address] [place], [place], [address] Phone: [phone]   Fax:  [phone]  Physical Therapy Treatment  Patient Details  Name: Marie Ramos MRN: [id number] Date of Birth: 11/01/1955 Referring Provider: Dr. Posey Pronto  Encounter Date: 10/05/2015      PT End of Session - 10/05/15 0928    Visit Number 5   Number of Visits 9   Date for PT Re-Evaluation 10/21/15   Authorization Type BCBS (30 visit limit) emailed Lattie Haw regarding if 30 visit limit applies to all 3 disciplines.   PT Start Time 0803   PT Stop Time 0844   PT Time Calculation (min) 41 min   Equipment Utilized During Treatment Gait belt   Activity Tolerance Patient tolerated treatment well   Behavior During Therapy WFL for tasks assessed/performed      Past Medical History  Diagnosis Date  . Bipolar 1 disorder ([place])   . Hypertrophic obstructive cardiomyopathy ([place]) 08/29/2015  . Cardiac arrest ([place]) 08/24/2015  . QT prolongation 08/29/2015  . TBI (traumatic brain injury) [facility]) 2011    Inpatient rehab [facility]  . Macular degeneration   . Chronically dry eyes   . Ventricular fibrillation ([place]) 09/03/15    MDT ICD Dr. Rayann Heman    Past Surgical History  Procedure Laterality Date  . Cardiac catheterization N/A 08/24/2015    Procedure: Left Heart Cath and Coronary Angiography;  Surgeon: Sherren Mocha, MD;  Location: [facility];  Service: Cardiovascular;  Laterality: N/A;  . Abdominal hysterectomy    . Tonsillectomy    . Splenectomy, total  2011  . Ep implantable device N/A 09/03/2015    MDT dual chamber ICD    There were no vitals filed for this visit.  Visit Diagnosis:  Other abnormalities of gait and mobility      Subjective Assessment - 10/05/15 0805    Subjective (p) Pt denied falls since lsat visit. Pt reported she has been using ice after exerciess for L knee pain.   Patient is accompained by: (p) Family member  [name]   Pertinent History (p) Bipolar disorder, pacemaker placed in 08/2015, TBI   Patient Stated Goals (p) Be less tired   Currently in Pain? (p) Yes   Pain Score (p) 4    Pain Location (p) Knee   Pain Orientation (p) Left   Pain Descriptors / Indicators (p) Aching   Pain Type (p) Chronic pain   Pain Onset (p) More than a month ago   Pain Frequency (p) Intermittent   Aggravating Factors  (p) too much activity   Pain Relieving Factors (p) Tylenol                         [facility] Adult PT Treatment/Exercise - 10/05/15 0830    High Level Balance   High Level Balance Activities Side stepping;Backward walking;Head turns;Tandem walking;Marching forwards  and cone taps, sidestepping c and s marches   High Level Balance Comments Performed on red/lblue mats and on even surfaces in // bars: pt performed SLS on B LEs while hovering LE over 4" step x6reps/LE, cone taps 4x5cones/LE while looking at cones and without looking at cones to improve proprioception. Pt performed all other activities 4x7'/activity. Cues for technique. Performed with min guard to min A to maintain balance, performed with 0-1UE suport.                   PT Short Term Goals -  09/22/15 1340    PT SHORT TERM GOAL #1   Title same as LTGs.           PT Long Term Goals - 09/27/15 1226    PT LONG TERM GOAL #1   Title Pt will be IND in HEP to improve deficits listed above. Target date: 10/19/15   Status On-going   PT LONG TERM GOAL #2   Title Perform 6-minute walk test and write goal. Target date: 10/19/15   Status Achieved   PT LONG TERM GOAL #3   Title Pt will improve BERG score to >/=52/56 to decr. falls risk. Target date: 10/19/15   Status On-going   PT LONG TERM GOAL #4   Title Pt will amb. 1000' over even/uneven terrain IND in order to improve functional mobility and to amb. at work (schools). Target date: 10/19/15   Status On-going   PT LONG TERM GOAL #5   Title Pt will improve Neuro QoL: LE score to  >/=50 to improve quality of life. Target date: 10/19/15   Status On-going   Additional Long Term Goals   Additional Long Term Goals Yes   PT LONG TERM GOAL #6   Title Pt will perform 6-minute walk test with >/= 1155' in order to improve endurance. Target date: 10/19/15   Status New               Plan - 10/05/15 [id number]    Clinical Impression Statement Pt demonstrated progress, as she was able to perform high level balance exercises with min guard to min A. Pt required two seated rest breaks 2/2 fatigue. Pt required incr. time to allow processing prior to performing activities. Continue with POC.    Pt will benefit from skilled therapeutic intervention in order to improve on the following deficits Abnormal gait;Decreased endurance;Decreased cognition;Decreased balance;Decreased mobility;Decreased knowledge of use of DME;Decreased strength;Pain   Rehab Potential Good   Clinical Impairments Affecting Rehab Potential co-morbidities   PT Frequency 2x / week   PT Duration 4 weeks   PT Treatment/Interventions ADLs/Self Care Home Management;Neuromuscular re-education;Biofeedback;Electrical Stimulation;Balance training;Manual techniques;Therapeutic exercise;Therapeutic activities;Orthotic Fit/Training;Functional mobility training;Stair training;Gait training;DME Instruction;Patient/family education;Cognitive remediation   PT Next Visit Plan Gait training and balance training over compliant surfaces with head turns.   PT Home Exercise Plan Balance and strengthening. Reviewed OTAGO program exercises today issued last session.   Consulted and Agree with Plan of Care Patient;Family member/caregiver   Family Member Consulted pt's husband: [name]        Problem List Patient Active Problem List   Diagnosis Date Noted  . E. coli UTI 09/05/2015  . Anoxic brain injury ([place]) 09/05/2015  . Abnormality of gait   . Acute bilat watershed infarction [facility])   . Notalgia   . Acute systolic congestive heart  failure ([place])   . Benign essential HTN   . Acute lower UTI   . AKI (acute kidney injury) ([place])   . History of traumatic brain injury   . Bipolar affective disorder in remission ([place])   . Leukocytosis   . Thrombocytosis ([place])   . Cerebral infarction due to embolism of cerebral artery ([place])   . Anoxic brain damage ([facility])   . Cough   . Essential hypertension   . Systolic dysfunction with acute on chronic heart failure ([place])   . Hypertrophic obstructive cardiomyopathy ([place]) 08/29/2015  . QT prolongation 08/29/2015  . Hypokalemia 08/29/2015  . Acute respiratory failure ([place])   . Cardiac arrest ([place]) 08/24/2015  Shanera Meske L 10/05/2015, 9:31 AM  [name] [address] [place], [place], [address] Phone: [phone]   Fax:  [phone]  Name: CHESLEY TURKNETT MRN: [id number] Date of Birth: [birth year]    Geoffry Paradise, PT,DPT 10/05/2015 9:31 AM Phone: [phone] Fax: [phone]

## 2015-10-05 NOTE — Therapy (Signed)
[facility] [address] [place], [place], [address] Phone: [phone]   Fax:  [phone]  Occupational Therapy Treatment  Patient Details  Name: Marie Ramos MRN: [id number] Date of Birth: 1956-02-13 Referring Provider: Dr. Posey Pronto  Encounter Date: 10/05/2015      OT End of Session - 10/05/15 0912    Visit Number 6   Number of Visits 17   Date for OT Re-Evaluation 11/18/15   Authorization Type BCBS   Authorization Time Period 30 visitrs combined   Authorization - Visit Number 6   Authorization - Number of Visits 10   OT Start Time 0850   OT Stop Time 0930   OT Time Calculation (min) 40 min   Activity Tolerance Patient tolerated treatment well   Behavior During Therapy [name] for tasks assessed/performed      Past Medical History  Diagnosis Date  . Bipolar 1 disorder ([place])   . Hypertrophic obstructive cardiomyopathy ([place]) 08/29/2015  . Cardiac arrest ([place]) 08/24/2015  . QT prolongation 08/29/2015  . TBI (traumatic brain injury) [facility]) 2011    Inpatient rehab [facility]  . Macular degeneration   . Chronically dry eyes   . Ventricular fibrillation ([place]) 09/03/15    MDT ICD Dr. Rayann Heman    Past Surgical History  Procedure Laterality Date  . Cardiac catheterization N/A 08/24/2015    Procedure: Left Heart Cath and Coronary Angiography;  Surgeon: Sherren Mocha, MD;  Location: [facility];  Service: Cardiovascular;  Laterality: N/A;  . Abdominal hysterectomy    . Tonsillectomy    . Splenectomy, total  2011  . Ep implantable device N/A 09/03/2015    MDT dual chamber ICD    There were no vitals filed for this visit.  Visit Diagnosis:  Cognitive deficits following cerebral infarction  Visuospatial deficit      Subjective Assessment - 10/05/15 0907    Pertinent History s/p recent pacemaker placement, see Epic, no lifting over 10 lbs   Patient Stated Goals to resume prior level of function   Currently in  Pain? Yes   Pain Score 5    Pain Location Knee   Pain Orientation Left   Pain Descriptors / Indicators Aching   Pain Type Acute pain   Pain Onset More than a month ago   Pain Frequency Intermittent   Aggravating Factors  movement   Pain Relieving Factors tylenol          Treatment: functional problem solving basic to organize a breakfast for 10 people and see if she could stay in budget. Pt originally miscalculated  then she self corrected.   Complex organizational task to write down supplies need for cleaning and painting a new apartment. Pt omitted several items, yet was able to generate with min v.c. Copying a small peg design with 100% accuracy for problem solving and coordination.                      OT Short Term Goals - 10/03/15 0947    OT SHORT TERM GOAL #1   Title I with HEP.   Time 4   Period Weeks   Status On-going   OT SHORT TERM GOAL #2   Title Pt will perform home management/ simple cooking at a supervision level demonstrating good safety awareness.   Time 4   Period Weeks   Status On-going   OT SHORT TERM GOAL #3   Title Pt will demonstrate improved activity tolerance as  evidenced by performing functional activity/ home management in standing x 25 mins prior to rest break without LOB.   Time 4   Period Weeks   Status New   OT SHORT TERM GOAL #4   Title Pt will perform functional tabletop activities with a visual component with 90% or better accuracy.   Time 4   Period Weeks   Status On-going   OT SHORT TERM GOAL #5   Title Pt will perform a mod complex organizational task with 95% or better accuracy in prep for return to work.   Time 4   Period Weeks   Status New           OT Long Term Goals - 09/18/15 [id number]    OT LONG TERM GOAL #1   Title I with updated HEP.   Time 8   Period Weeks   Status New   OT LONG TERM GOAL #2   Title Pt will demonstrate ability to cook 2 items at a modified independent level simultaneously  demonstrating good safety awareness.   Time 8   Period Weeks   Status New   OT LONG TERM GOAL #3   Title Pt will demonstrate ability to perform a physical and cognitive task simultaneously with 90% or better accuracy.   Time 8   Period Weeks   Status New   OT LONG TERM GOAL #4   Title Pt will demonstrate ability to navigate  a busy environment and locate items with 90% or better accuracy.   Time 8   Period Weeks   Status New   OT LONG TERM GOAL #5   Title Pt will report that she has resumed functional activities / home management in standing x 45 mins or greater prior to rest break.   Time 8   Period Weeks   Status New               Plan - 10/05/15 0911    Clinical Impression Statement Pt continues to progress towards goals for cognition. Pt reports fatigue today after playing mahjong last night.   Rehab Potential Good   OT Frequency 2x / week   OT Duration 8 weeks   OT Treatment/Interventions Self-care/ADL training;Therapeutic exercise;Functional Mobility Training;Patient/family education;Balance training;Splinting;Manual Therapy;Neuromuscular education;Ultrasound;Energy conservation;Therapeutic exercises;Therapeutic activities;DME and/or AE instruction;Parrafin;Electrical Stimulation;Fluidtherapy;Gait Training;Cognitive remediation/compensation;Moist Heat;Contrast Bath;Passive range of motion;Visual/perceptual remediation/compensation   Plan cognition/ visual perceptual skills   Consulted and Agree with Plan of Care Patient   Family Member Consulted husband        Problem List Patient Active Problem List   Diagnosis Date Noted  . E. coli UTI 09/05/2015  . Anoxic brain injury ([place]) 09/05/2015  . Abnormality of gait   . Acute bilat watershed infarction [facility])   . Notalgia   . Acute systolic congestive heart failure ([place])   . Benign essential HTN   . Acute lower UTI   . AKI (acute kidney injury) ([place])   . History of traumatic brain injury   . Bipolar affective  disorder in remission ([place])   . Leukocytosis   . Thrombocytosis ([place])   . Cerebral infarction due to embolism of cerebral artery ([place])   . Anoxic brain damage ([facility])   . Cough   . Essential hypertension   . Systolic dysfunction with acute on chronic heart failure ([place])   . Hypertrophic obstructive cardiomyopathy ([place]) 08/29/2015  . QT prolongation 08/29/2015  . Hypokalemia 08/29/2015  . Acute respiratory failure ([place])   . Cardiac arrest ([place])  08/24/2015    Ramos,Marie 10/05/2015, 4:52 PM Theone Murdoch, OTR/L [phone] Phone: [phone] 4:52 PM 10/05/2015 [facility] [address] [place], [place], [address] Phone: [phone]   Fax:  [phone]  Name: Marie Ramos MRN: [id number] Date of Birth: 02-23-1956

## 2015-10-05 NOTE — Therapy (Signed)
[facility] [address] [place], [place], [address] Phone: [phone]   Fax:  [phone]  Speech Language Pathology Treatment  Patient Details  Name: Marie Ramos MRN: [id number] Date of Birth: Dec 26, 1955 Referring Provider: Delice Lesch  Encounter Date: 10/05/2015      End of Session - 10/05/15 1020    Visit Number 5   Number of Visits 16   Date for SLP Re-Evaluation 11/19/15   Authorization Type 30 visit limit combined   Authorization - Number of Visits 45   SLP Start Time [id number]   SLP Stop Time  1018   SLP Time Calculation (min) 45 min   Activity Tolerance Patient tolerated treatment well      Past Medical History  Diagnosis Date  . Bipolar 1 disorder ([place])   . Hypertrophic obstructive cardiomyopathy ([place]) 08/29/2015  . Cardiac arrest ([place]) 08/24/2015  . QT prolongation 08/29/2015  . TBI (traumatic brain injury) [facility]) 2011    Inpatient rehab [facility]  . Macular degeneration   . Chronically dry eyes   . Ventricular fibrillation ([place]) 09/03/15    MDT ICD Dr. Rayann Heman    Past Surgical History  Procedure Laterality Date  . Cardiac catheterization N/A 08/24/2015    Procedure: Left Heart Cath and Coronary Angiography;  Surgeon: Sherren Mocha, MD;  Location: [facility];  Service: Cardiovascular;  Laterality: N/A;  . Abdominal hysterectomy    . Tonsillectomy    . Splenectomy, total  2011  . Ep implantable device N/A 09/03/2015    MDT dual chamber ICD    There were no vitals filed for this visit.  Visit Diagnosis: Cognitive communication deficit      Subjective Assessment - 10/05/15 0936    Subjective "You're going to be mad at me - I didn't finish my homework."   Patient is accompained by: Family member  husband Marie Ramos               ADULT SLP TREATMENT - 10/05/15 0958    General Information   Behavior/Cognition Alert;Cooperative;Pleasant mood;Decreased sustained attention   Pain  Assessment   Pain Assessment No/denies pain   Cognitive-Linquistic Treatment   Treatment focused on Cognition   Skilled Treatment Pt brought in homework she was able to complete, first away. SLP facilitated improvement with pt's reasoning by assisting with usual mod A initially faded to occasional mod A with deductive reasoning.    Assessment / Recommendations / Plan   Plan Continue with current plan of care   Progression Toward Goals   Progression toward goals Progressing toward goals          SLP Education - 10/05/15 1020    Education provided Yes   Education Details Attention skills premorbidly without ADD med - similar to pt performance now?   Person(s) Educated Patient;Spouse   Methods Explanation   Comprehension Verbalized understanding          SLP Short Term Goals - 10/05/15 1023    SLP SHORT TERM GOAL #1   Title pt will demo emergent awareness in cognitive linguistic tasks to ID 80% of errors made over three sessions   Time 3   Period Weeks   Status On-going   SLP SHORT TERM GOAL #2   Title selective attention for 10 minutes in mod noisy environment with mod complex cognitive linguistic task   Time 3   Period Weeks   Status On-going   SLP SHORT TERM GOAL #3   Title pt  will use memory compensation system for answering questions during therapy    Time 3   Period Weeks   Status On-going   SLP SHORT TERM GOAL #4   Title pt will tell SLP 3 non-physical deficits (related to cognition) over 3 sessions   Time 3   Period Weeks   Status On-going          SLP Long Term Goals - 10/05/15 1024    SLP LONG TERM GOAL #1   Title Pt will demo awareness to correct 95% of errors on therapy tasks   Time 7   Period Weeks   Status On-going   SLP LONG TERM GOAL #2   Title pt will demo alternating attention with rare min A cues for redirection   Time 7   Period Weeks   Status On-going   SLP LONG TERM GOAL #3   Title pt will use memory compensations successfully PRN in 4  therapy sessions   Baseline one session 10-05-15   Time 7   Period Weeks   Status On-going   SLP LONG TERM GOAL #4   Title pt will demo anticipatory awareness in order to use compensaations for cognitive deficits in 8 therapy sessions   Time 11   Period Weeks   Status On-going          Plan - 10/05/15 1021    Clinical Impression Statement Deficits in cognitive linguistics cont, and skilled ST cont'd recommended. Pt would benefit from cont'd skilled ST to assist pt in her possible return to work, and to reduce caregiver burden. However SLP wonders what pt's skills would have been like premorbidly without attention meds.   Speech Therapy Frequency 2x / week   Duration --  11 weeks   Treatment/Interventions Cognitive reorganization;SLP instruction and feedback;Compensatory strategies;Internal/external aids;Environmental controls;Patient/family education;Functional tasks;Cueing hierarchy   Potential to Achieve Goals Good   Potential Considerations Ability to learn/carryover information;Severity of impairments        Problem List Patient Active Problem List   Diagnosis Date Noted  . E. coli UTI 09/05/2015  . Anoxic brain injury ([place]) 09/05/2015  . Abnormality of gait   . Acute bilat watershed infarction [facility])   . Notalgia   . Acute systolic congestive heart failure ([place])   . Benign essential HTN   . Acute lower UTI   . AKI (acute kidney injury) ([place])   . History of traumatic brain injury   . Bipolar affective disorder in remission ([place])   . Leukocytosis   . Thrombocytosis ([place])   . Cerebral infarction due to embolism of cerebral artery ([place])   . Anoxic brain damage ([facility])   . Cough   . Essential hypertension   . Systolic dysfunction with acute on chronic heart failure ([place])   . Hypertrophic obstructive cardiomyopathy ([place]) 08/29/2015  . QT prolongation 08/29/2015  . Hypokalemia 08/29/2015  . Acute respiratory failure ([place])   . Cardiac arrest ([place]) 08/24/2015     [facility] ,[place], [place]   10/05/2015, 10:25 AM  Marie Ramos [place], [place], [address] Phone: [phone]   Fax:  [phone]   Name: TAREKA MARLETTE MRN: [id number] Date of Birth: Jan 07, 1956

## 2015-10-09 ENCOUNTER — Encounter: Payer: Self-pay | Admitting: Occupational Therapy

## 2015-10-09 ENCOUNTER — Ambulatory Visit: Payer: Self-pay

## 2015-10-10 ENCOUNTER — Other Ambulatory Visit: Payer: Self-pay | Admitting: *Deleted

## 2015-10-10 MED ORDER — OXCARBAZEPINE 300 MG PO TABS: 300.0000 mg | ORAL_TABLET | Freq: Two times a day (BID) | ORAL | Status: DC

## 2015-10-10 NOTE — Telephone Encounter (Signed)
Fax request for refill

## 2015-10-11 ENCOUNTER — Other Ambulatory Visit: Payer: Self-pay

## 2015-10-11 ENCOUNTER — Ambulatory Visit (INDEPENDENT_AMBULATORY_CARE_PROVIDER_SITE_OTHER): Payer: BLUE CROSS/BLUE SHIELD | Admitting: Physician Assistant

## 2015-10-11 ENCOUNTER — Encounter: Payer: Self-pay | Admitting: Physician Assistant

## 2015-10-11 VITALS — BP 148/94 | HR 94 | Ht 62.0 in | Wt 233.0 lb

## 2015-10-11 DIAGNOSIS — I429 Cardiomyopathy, unspecified: Secondary | ICD-10-CM | POA: Diagnosis not present

## 2015-10-11 DIAGNOSIS — I10 Essential (primary) hypertension: Secondary | ICD-10-CM | POA: Diagnosis not present

## 2015-10-11 DIAGNOSIS — Z5189 Encounter for other specified aftercare: Secondary | ICD-10-CM

## 2015-10-11 DIAGNOSIS — I469 Cardiac arrest, cause unspecified: Secondary | ICD-10-CM

## 2015-10-11 MED ORDER — ATORVASTATIN CALCIUM 20 MG PO TABS: 20.0000 mg | ORAL_TABLET | Freq: Every day | ORAL | Status: DC

## 2015-10-11 MED ORDER — APIXABAN 5 MG PO TABS: 5.0000 mg | ORAL_TABLET | Freq: Two times a day (BID) | ORAL | Status: DC

## 2015-10-11 MED ORDER — CEPHALEXIN 500 MG PO CAPS: 500.0000 mg | ORAL_CAPSULE | Freq: Three times a day (TID) | ORAL | Status: DC

## 2015-10-11 MED ORDER — AMLODIPINE BESYLATE 5 MG PO TABS: 5.0000 mg | ORAL_TABLET | Freq: Every day | ORAL | Status: DC

## 2015-10-11 NOTE — Telephone Encounter (Signed)
Pt needs refills on Lipitor, amlodipine and Eliquis. Will give pt courtesy refills. Informed pt that her PCP needs to take over these medications.

## 2015-10-11 NOTE — Patient Instructions (Addendum)
Medication Instructions:   TAKE KEFLEX THREE TIME A DAY FOR 14 DAYS    If you need a refill on your cardiac medications before your next appointment, please call your pharmacy.  Labwork: NONE ORDER TODAY   Testing/Procedures: NONE ORDER TODAY    Follow-Up: ON October 22 2015 PER ALLRED SAYS CAN OVER BOOK   Any Other Special Instructions Will Be Listed Below (If Applicable).                                                                                                                                                  Medication Instructions:     If you need a refill on your cardiac medications before your next appointment, please call your pharmacy.  Labwork:   Testing/Procedures:   Follow-Up:   Any Other Special Instructions Will Be Listed Below (If Applicable).

## 2015-10-11 NOTE — Progress Notes (Signed)
Cardiology Office Note Date:  10/11/2015  Patient ID:  Marie Ramos, Marie Ramos 1956/06/24, MRN [id number] PCP:   Melinda Crutch, MD  Cardiologist:  None prior to hospital, (new) Dr. Burt Knack Electrophysiologist: Dr. Rayann Heman    Chief Complaint: post hospital, visit  History of Present Illness: Marie Ramos is a 60 y.o. female with history of cardiac arrest 08/24/15, she was at work, AED was immediately applied and recommended and received 2 shocks and CPR, FR found her in PEA with ROSC after epinephrine.    08/24/15: presumd VF arrest with AED advising shocks, CPR, PEA , she had emergent cardiac cath with no CAD, long QT on her EKG noted. Hypokalemic noted as well.   She underwent hypothermia protocol.  Suspected her psych meds were the etiology pf her QT prolongation.  Echo suggested possible HCM,, MRI revealed hypertensive CM, not HCM.  MRI 2/26 noted CVA, neuro on case, possibly related to cath procedure, not recommended to have TEE or further w/u.  She underwent ICD implant  By Dr. Rayann Heman on 09/04/15.  Psychiatry was consulted and aided in her medications, to avoid QT prolonging meds. Discharged from CIR on the current regime. She developed DVT to LLE started on Eliquis.  She was discharged to CIR until 09/14/15 and has undergone out patient rehab since then.  She has f/u with neurology and psychiatry arranged.  She has not had any shocks from her device, no CP, palpitations, no SOB, no near syncope or syncope.  She continues with rehab and feels like her exertional capacity and ability to do things continues to improve and they are decreasing her frequency of visits. She has not had any bleeding or signs of bleeding on the Eliquis.  She noticed about a week ago that the area by her device was "itchy" and had become red, and intermittently since then noted small amount of drainage from 2 areas, she has been applying antibacterial ointment to the area and keeping it covered.  She has not felt ill or any  symptoms of illness no subjective symptoms of fever.  Past Medical History  Diagnosis Date  . Bipolar 1 disorder ([place])   . Hypertrophic obstructive cardiomyopathy ([place]) 08/29/2015  . Cardiac arrest ([place]) 08/24/2015  . QT prolongation 08/29/2015  . TBI (traumatic brain injury) [facility] 2011    Inpatient rehab [facility]  . Macular degeneration   . Chronically dry eyes   . Ventricular fibrillation ([place]) 09/03/15    MDT ICD Dr. Rayann Heman    Past Surgical History  Procedure Laterality Date  . Cardiac catheterization N/A 08/24/2015    Procedure: Left Heart Cath and Coronary Angiography;  Surgeon: Sherren Mocha, MD;  Location: [place] [facility];  Service: Cardiovascular;  Laterality: N/A;  . Abdominal hysterectomy    . Tonsillectomy    . Splenectomy, total  2011  . Ep implantable device N/A 09/03/2015    MDT dual chamber ICD    Current Outpatient Prescriptions  Medication Sig Dispense Refill  . acetaminophen (TYLENOL) 325 MG tablet Take 1-2 tablets (325-650 mg total) by mouth every 4 (four) hours as needed for mild pain.    Marland Kitchen ALPRAZolam (XANAX) 0.25 MG tablet Take 1 tablet (0.25 mg total) by mouth at bedtime as needed for anxiety or sleep. 30 tablet 0  . amLODipine (NORVASC) 5 MG tablet Take 1 tablet (5 mg total) by mouth daily with breakfast. 30 tablet 0  . apixaban (ELIQUIS) 5 MG TABS tablet Take 1 tablet (5 mg total) by  mouth 2 (two) times daily. 60 tablet 0  . atorvastatin (LIPITOR) 20 MG tablet Take 1 tablet (20 mg total) by mouth daily at 6 PM. 30 tablet 0  . benzonatate (TESSALON) 100 MG capsule Take 1 capsule (100 mg total) by mouth at bedtime. 20 capsule 0  . carvedilol (COREG) 6.25 MG tablet Take 1 tablet (6.25 mg total) by mouth 2 (two) times daily with a meal. With breakfast and supper 60 tablet 0  . famotidine (PEPCID) 20 MG tablet Take 1 tablet (20 mg total) by mouth daily. (Patient taking differently: Take 20 mg by mouth at bedtime. ) 60 tablet 1  . losartan (COZAAR) 50 MG tablet Take  50 mg by mouth daily.   0  . multivitamin (PROSIGHT) TABS tablet Take 1 tablet by mouth daily. 30 each 0  . Oxcarbazepine (TRILEPTAL) 300 MG tablet Take 1 tablet (300 mg total) by mouth 2 (two) times daily. 60 tablet 0  . traMADol (ULTRAM) 50 MG tablet Take 1 tablet (50 mg total) by mouth every 12 (twelve) hours as needed for moderate pain. 30 tablet 0  . venlafaxine XR (EFFEXOR-XR) 75 MG 24 hr capsule Take 75 mg by mouth daily.  4   No current facility-administered medications for this visit.    Allergies:   Naproxen and Vicodin   Social History:  The patient  reports that she has never smoked. She has never used smokeless tobacco. She reports that she does not drink alcohol or use illicit drugs.   Family History:  The patient's family history includes COPD in her father; Cancer in her mother.  ROS:  Please see the history of present illness.    All other systems are reviewed and otherwise negative.   PHYSICAL EXAM:  VS:  BP 148/94 mmHg  Pulse 94  Ht 5\' 2"  (1.575 m)  Wt 233 lb (105.688 kg)  BMI 42.61 kg/m2 BMI: Body mass index is 42.61 kg/(m^2). Well nourished, well developed, in no acute distress HEENT: normocephalic, atraumatic Neck: no JVD, carotid bruits or masses Cardiac:  normal S1, S2; RRR; no significant murmurs, no rubs, or gallops Lungs:  clear to auscultation bilaterally, no wheezing, rhonchi or rales Abd: soft, nontender,  + BS MS: no deformity or atrophy Ext: no edema Skin: warm and dry, no rash Neuro:  No gross deficits appreciated Psych: euthymic mood, full affect  ICD site is stable, is erythematous, somewhat firm.  She comes with a bandage on it that appears to have had small amount of blood or drainage on it. Examined by Dr. Rayann Heman, area appears superficial area of bleeding, no active drainage, non-tender.   EKG:  09/05/15 is SR, LVH ICD interrogation 09/19/15: normal function, see pace art  08/24/15: Echocardiogram:  Study Conclusions - Left ventricle: The  cavity size was normal. Systolic function was  mildly to moderately reduced. The estimated ejection fraction was  in the range of 40% to 45%. There is severe asymmetric septal  hypetrophy without dynamic obstruction. Findings suggestive of  hypertrophic nonobstructive cardiomyopathy. Diffuse hypokinesis.  Doppler parameters are consistent with abnormal left ventricular  relaxation (grade 1 diastolic dysfunction). Septal thickness, ED  (2D): 17 mm. Posterior wall thickness, ED (2D): 10.5 mm. - Technically difficult study. Impressions: - Technically difficult study, however appears to have asymmetric  septal hypertrophic cardiomyoapathy without obstruction. In  setting of VF arrest, would consider cardiac MRI to better  evaluate as this could be the potential etiology of her  arrhythmia.  08/24/15: LHC Conclusion  Widely patent coronary arteries Normal LVEDP       Recent Labs: 09/02/2015: TSH 1.952 09/05/2015: Magnesium 2.2 09/06/2015: ALT 30 09/10/2015: BUN 20; Creatinine, Ser 0.91; Hemoglobin 11.1*; Platelets 421*; Potassium 4.6; Sodium 143  09/03/2015: Cholesterol 225*; HDL 43; LDL Cholesterol 115*; Total CHOL/HDL Ratio 5.2; Triglycerides 333*; VLDL 67*   CrCl cannot be calculated (Patient has no serum creatinine result on file.).   Wt Readings from Last 3 Encounters:  10/11/15 233 lb (105.688 kg)  09/12/15 223 lb (101.152 kg)  09/04/15 221 lb (100.245 kg)     Other studies reviewed: Additional studies/records reviewed today include: summarized above  DEVICE information:  MDT dual chamber ICD (MRI compatible), implanted 09/03/15, Dr. Rayann Heman  ASSESSMENT AND PLAN:  1. VF arrest, QT prolongation felt secondary to psych meds     MDT ICD, normal function  2. Bipolar     Continue f/u with psych     Avoid QT prolonging medications  3. HTN, hypertensive CM     exam is compensated, weight is up but no exam evidence of fluid OL     Elevated today, her lisinopril  just recently changed losartan, follow  4. CVA     C/w neurlogy  5. LLE DVT     C/w PMD, on Eliquis  6. Erythematous ICD wound     Wound care instructions were given to the patient, keep dry/do not get wet until seen in f/u by Dr. Rayann Heman, keep covered with clean bandage     Rx for Keflex 500mg  TID x14 days     Notify immediately if any worse, develop any symptoms of illness or fever   Disposition: F/u with Dr. Rayann Heman 10/22/15 scheduled.  Current medicines are reviewed at length with the patient today.  The patient did not have any concerns regarding medicines.  Haywood Lasso, PA-C 10/11/2015 11:57 AM     [facility] [place] [address] [phone] (office)  [phone] (fax)

## 2015-10-12 ENCOUNTER — Ambulatory Visit: Payer: BLUE CROSS/BLUE SHIELD | Attending: Family Medicine | Admitting: Occupational Therapy

## 2015-10-12 ENCOUNTER — Ambulatory Visit: Payer: BLUE CROSS/BLUE SHIELD

## 2015-10-12 DIAGNOSIS — R41841 Cognitive communication deficit: Secondary | ICD-10-CM | POA: Diagnosis present

## 2015-10-12 DIAGNOSIS — R269 Unspecified abnormalities of gait and mobility: Secondary | ICD-10-CM | POA: Insufficient documentation

## 2015-10-12 DIAGNOSIS — R29898 Other symptoms and signs involving the musculoskeletal system: Secondary | ICD-10-CM | POA: Insufficient documentation

## 2015-10-12 DIAGNOSIS — R41842 Visuospatial deficit: Secondary | ICD-10-CM | POA: Diagnosis not present

## 2015-10-12 DIAGNOSIS — R41844 Frontal lobe and executive function deficit: Secondary | ICD-10-CM | POA: Diagnosis present

## 2015-10-12 DIAGNOSIS — R2689 Other abnormalities of gait and mobility: Secondary | ICD-10-CM | POA: Diagnosis present

## 2015-10-12 DIAGNOSIS — R41840 Attention and concentration deficit: Secondary | ICD-10-CM | POA: Diagnosis present

## 2015-10-12 DIAGNOSIS — R2681 Unsteadiness on feet: Secondary | ICD-10-CM | POA: Diagnosis present

## 2015-10-12 DIAGNOSIS — M6281 Muscle weakness (generalized): Secondary | ICD-10-CM

## 2015-10-12 NOTE — Therapy (Signed)
[facility] [address] [place], [place], [address] Phone: [phone]   Fax:  [phone]  Physical Therapy Treatment  Patient Details  Name: Marie Ramos MRN: [id number] Date of Birth: 1955/07/20 Referring Provider: Dr. Posey Pronto  Encounter Date: 10/12/2015      PT End of Session - 10/12/15 1430    Visit Number 6   Number of Visits 9   Date for PT Re-Evaluation 10/21/15   Authorization Type BCBS (30 visit limit) emailed Lattie Haw regarding if 30 visit limit applies to all 3 disciplines.   PT Start Time 0805   PT Stop Time 0844   PT Time Calculation (min) 39 min   Equipment Utilized During Treatment Gait belt   Activity Tolerance Patient tolerated treatment well   Behavior During Therapy WFL for tasks assessed/performed      Past Medical History  Diagnosis Date  . Bipolar 1 disorder ([place])   . Hypertrophic obstructive cardiomyopathy ([place]) 08/29/2015  . Cardiac arrest ([place]) 08/24/2015  . QT prolongation 08/29/2015  . TBI (traumatic brain injury) [facility]) 2011    Inpatient rehab [facility]  . Macular degeneration   . Chronically dry eyes   . Ventricular fibrillation ([place]) 09/03/15    MDT ICD Dr. Rayann Heman    Past Surgical History  Procedure Laterality Date  . Cardiac catheterization N/A 08/24/2015    Procedure: Left Heart Cath and Coronary Angiography;  Surgeon: Sherren Mocha, MD;  Location: [place] [facility];  Service: Cardiovascular;  Laterality: N/A;  . Abdominal hysterectomy    . Tonsillectomy    . Splenectomy, total  2011  . Ep implantable device N/A 09/03/2015    MDT dual chamber ICD    There were no vitals filed for this visit.      Subjective Assessment - 10/12/15 0807    Subjective Pt denied falls since last visit. Pt reported her chest device is infected and cardiologist put her on antibiotics.   Patient is accompained by: Family member   Pertinent History Bipolar disorder, pacemaker placed in 08/2015, TBI   Patient Stated Goals Be less tired   Currently in Pain? Yes   Pain Score 3    Pain Location Knee   Pain Orientation Left   Pain Descriptors / Indicators Aching   Pain Type Acute pain   Pain Onset In the past 7 days   Pain Frequency Intermittent   Aggravating Factors  movement   Pain Relieving Factors tylenol                         [facility] Adult PT Treatment/Exercise - 10/12/15 1424    High Level Balance   High Level Balance Activities Side stepping;Backward walking;Head turns;Marching forwards;Braiding  forward c and s head turns   High Level Balance Comments Performed on red mats, one mat with bean bags placed under mat and one mat without bags underneath it. Pt performed with min guard and min A to maintain balance, cues to improve lateral wt. shifting and technique. 4-6x15'/activity.  Pt also performed cone taps and knock over/place upright 2x5cones on mats.                PT Education - 10/12/15 1429    Education provided Yes   Education Details PT discusussed d/c next session and is agreeable, pt is aware that she will see another therapist next session due to schedule conflicts.   Person(s) Educated Patient;Spouse   Methods Explanation  Comprehension Verbalized understanding          PT Short Term Goals - 09/22/15 1340    PT SHORT TERM GOAL #1   Title same as LTGs.           PT Long Term Goals - 09/27/15 1226    PT LONG TERM GOAL #1   Title Pt will be IND in HEP to improve deficits listed above. Target date: 10/19/15   Status On-going   PT LONG TERM GOAL #2   Title Perform 6-minute walk test and write goal. Target date: 10/19/15   Status Achieved   PT LONG TERM GOAL #3   Title Pt will improve BERG score to >/=52/56 to decr. falls risk. Target date: 10/19/15   Status On-going   PT LONG TERM GOAL #4   Title Pt will amb. 1000' over even/uneven terrain IND in order to improve functional mobility and to amb. at work (schools). Target date:  10/19/15   Status On-going   PT LONG TERM GOAL #5   Title Pt will improve Neuro QoL: LE score to >/=50 to improve quality of life. Target date: 10/19/15   Status On-going   Additional Long Term Goals   Additional Long Term Goals Yes   PT LONG TERM GOAL #6   Title Pt will perform 6-minute walk test with >/= 1155' in order to improve endurance. Target date: 10/19/15   Status New               Plan - 10/12/15 1430    Clinical Impression Statement Pt demonstrated progress, as she was able to perform high level balance activities on compliant surfaces with min guard and required min A only during 2 LOB episodes. Pt continues to require incr. processing time during multi-tasking activities. Continue with POC.    Rehab Potential Good   Clinical Impairments Affecting Rehab Potential co-morbidities   PT Frequency 2x / week   PT Duration 4 weeks   PT Treatment/Interventions ADLs/Self Care Home Management;Neuromuscular re-education;Biofeedback;Electrical Stimulation;Balance training;Manual techniques;Therapeutic exercise;Therapeutic activities;Orthotic Fit/Training;Functional mobility training;Stair training;Gait training;DME Instruction;Patient/family education;Cognitive remediation   PT Next Visit Plan Check goals and d/c.   PT Home Exercise Plan Balance and strengthening.    Consulted and Agree with Plan of Care Patient;Family member/caregiver   Family Member Consulted pt's husband: [name]      Patient will benefit from skilled therapeutic intervention in order to improve the following deficits and impairments:  Abnormal gait, Decreased endurance, Decreased cognition, Decreased balance, Decreased mobility, Decreased knowledge of use of DME, Decreased strength, Pain  Visit Diagnosis: Other abnormalities of gait and mobility - Plan: PT plan of care cert/re-cert  Muscle weakness (generalized) - Plan: PT plan of care cert/re-cert     Problem List Patient Active Problem List   Diagnosis  Date Noted  . E. coli UTI 09/05/2015  . Anoxic brain injury ([place]) 09/05/2015  . Abnormality of gait   . Acute bilat watershed infarction [facility])   . Notalgia   . Acute systolic congestive heart failure ([place])   . Benign essential HTN   . Acute lower UTI   . AKI (acute kidney injury) ([place])   . History of traumatic brain injury   . Bipolar affective disorder in remission ([place])   . Leukocytosis   . Thrombocytosis ([place])   . Cerebral infarction due to embolism of cerebral artery ([place])   . Anoxic brain damage ([facility])   . Cough   . Essential hypertension   . Systolic dysfunction with  acute on chronic heart failure ([place])   . Hypertrophic obstructive cardiomyopathy ([place]) 08/29/2015  . QT prolongation 08/29/2015  . Hypokalemia 08/29/2015  . Acute respiratory failure ([place])   . Cardiac arrest ([place]) 08/24/2015    Kaladin Noseworthy L 10/12/2015, 2:34 PM  [facility] [address] [place], [place], [address] Phone: [phone]   Fax:  [phone]  Name: DYNASTY MEES MRN: [id number] Date of Birth: Sep 27, 1955    Geoffry Paradise, PT,DPT 10/12/2015 2:34 PM Phone: [phone] Fax: [phone]

## 2015-10-12 NOTE — Patient Instructions (Signed)
  Please complete the assigned speech therapy homework and return it to your next session.  

## 2015-10-12 NOTE — Therapy (Signed)
[facility] [address] [place], [place], [address] Phone: [phone]   Fax:  [phone]  Occupational Therapy Treatment  Patient Details  Name: Marie Ramos MRN: [id number] Date of Birth: Feb 23, 1956 Referring Provider: Dr. Posey Pronto  Encounter Date: 10/12/2015      OT End of Session - 10/12/15 0943    Visit Number 7   Number of Visits 17   Date for OT Re-Evaluation 11/18/15   Authorization Type BCBS   Authorization Time Period 30 visitrs combined   Authorization - Visit Number 7   Authorization - Number of Visits 10   OT Start Time 0933   OT Stop Time 1015   OT Time Calculation (min) 42 min   Activity Tolerance Patient tolerated treatment well   Behavior During Therapy [facility] for tasks assessed/performed      Past Medical History  Diagnosis Date  . Bipolar 1 disorder ([place])   . Hypertrophic obstructive cardiomyopathy ([place]) 08/29/2015  . Cardiac arrest ([place]) 08/24/2015  . QT prolongation 08/29/2015  . TBI (traumatic brain injury) [facility]) 2011    [facility]patient [facility]hab [facility]  . Macular degeneration   . Chronically dry eyes   . Ventricular fibrillation ([place]) 09/03/15    MDT ICD Dr. Rayann Heman    Past Surgical History  Procedure Laterality Date  . Cardiac catheterization N/A 08/24/2015    Procedure: Left Heart Cath and Coronary Angiography;  Surgeon: Sherren Mocha, MD;  Location: [place] [facility];  Service: Cardiovascular;  Laterality: N/A;  . Abdominal hysterectomy    . Tonsillectomy    . Splenectomy, total  2011  . Ep implantable device N/A 09/03/2015    MDT dual chamber ICD    There were no vitals filed for this visit.      Subjective Assessment - 10/12/15 0936    Subjective  Pt reports knee is a little sore    Pertinent History s/p recent pacemaker placement, see Epic, no lifting over 10 lbs   Patient Stated Goals to resume prior level of function   Currently in Pain? Yes   Pain Score 2    Pain Location Knee    Pain Orientation Left   Pain Descriptors / Indicators Aching   Pain Type Acute pain   Pain Onset In the past 7 days   Pain Frequency Intermittent   Aggravating Factors  movement   Pain Relieving Factors tylenol   Multiple Pain Sites No         Treatment: 7 pieces of cleverness puzzle to copy 3 designs from a disorganized array of pieces, mod v.c. initally to get start, then pt completed remainder  of tasks with min v.c. Moderate level functional problem solving involving time, mod / max v.c. Initally then on min v.c., Pt required significantly increased time to complete 1/2 the problems.  pt was issued remainder of problems to complete as homework.                       OT Short Term Goals - 10/03/15 0947    OT SHORT TERM GOAL #1   Title I with HEP.   Time 4   Period Weeks   Status On-going   OT SHORT TERM GOAL #2   Title Pt will perform home management/ simple cooking at a supervision level demonstrating good safety awareness.   Time 4   Period Weeks   Status On-going   OT SHORT TERM GOAL #3   Title Pt will  demonstrate improved activity tolerance as evidenced by performing functional activity/ home management in standing x 25 mins prior to rest break without LOB.   Time 4   Period Weeks   Status New   OT SHORT TERM GOAL #4   Title Pt will perform functional tabletop activities with a visual component with 90% or better accuracy.   Time 4   Period Weeks   Status On-going   OT SHORT TERM GOAL #5   Title Pt will perform a mod complex organizational task with 95% or better accuracy in prep for return to work.   Time 4   Period Weeks   Status New           OT Long Term Goals - 09/18/15 [id number]    OT LONG TERM GOAL #1   Title I with updated HEP.   Time 8   Period Weeks   Status New   OT LONG TERM GOAL #2   Title Pt will demonstrate ability to cook 2 items at a modified independent level simultaneously demonstrating good safety awareness.   Time 8    Period Weeks   Status New   OT LONG TERM GOAL #3   Title Pt will demonstrate ability to perform a physical and cognitive task simultaneously with 90% or better accuracy.   Time 8   Period Weeks   Status New   OT LONG TERM GOAL #4   Title Pt will demonstrate ability to navigate  a busy environment and locate items with 90% or better accuracy.   Time 8   Period Weeks   Status New   OT LONG TERM GOAL #5   Title Pt will report that she has resumed functional activities / home management in standing x 45 mins or greater prior to rest break.   Time 8   Period Weeks   Status New               Plan - 10/12/15 1248    Clinical Impression Statement Pt is progressing towards goals for cognitive tasks with a visual component.   Rehab Potential Good   OT Frequency 2x / week   OT Duration 8 weeks   OT Treatment/Interventions Self-care/ADL training;Therapeutic exercise;Functional Mobility Training;Patient/family education;Balance training;Splinting;Manual Therapy;Neuromuscular education;Ultrasound;Energy conservation;Therapeutic exercises;Therapeutic activities;DME and/or AE instruction;Parrafin;Electrical Stimulation;Fluidtherapy;Gait Training;Cognitive remediation/compensation;Moist Heat;Contrast Bath;Passive range of motion;Visual/perceptual remediation/compensation   Plan cognition/ visual perceptual skills   Consulted and Agree with Plan of Care Patient      Patient will benefit from skilled therapeutic intervention in order to improve the following deficits and impairments visuospatial deficits and  Attention, concentration and executive function deficit .    Visit Diagnosis: Visuospatial deficit  Frontal lobe and executive function deficit  Attention and concentration deficit  Muscle weakness (generalized)    Problem List Patient Active Problem List   Diagnosis Date Noted  . E. coli UTI 09/05/2015  . Anoxic brain injury ([place]) 09/05/2015  . Abnormality of gait   .  Acute bilat watershed infarction [facility])   . Notalgia   . Acute systolic congestive heart failure ([place])   . Benign essential HTN   . Acute lower UTI   . AKI (acute kidney injury) ([place])   . History of traumatic brain injury   . Bipolar affective disorder in remission ([place])   . Leukocytosis   . Thrombocytosis ([place])   . Cerebral infarction due to embolism of cerebral artery ([place])   . Anoxic brain damage ([facility])   . Cough   .  Essential hypertension   . Systolic dysfunction with acute on chronic heart failure ([place])   . Hypertrophic obstructive cardiomyopathy ([place]) 08/29/2015  . QT prolongation 08/29/2015  . Hypokalemia 08/29/2015  . Acute respiratory failure ([place])   . Cardiac arrest ([place]) 08/24/2015    RINE,KATHRYN 10/12/2015, 12:50 PM Theone Murdoch, OTR/L [phone] Phone: [phone] 12:51 PM 10/12/2015 [facility] [address] [place], [place], [address] Phone: [phone]   Fax:  [phone]  Name: Marie Ramos MRN: [id number] Date of Birth: 1955/09/11

## 2015-10-12 NOTE — Therapy (Signed)
[facility] [address] [place], [place], [address] Phone: [phone]   Fax:  [phone]  Speech Language Pathology Treatment  Patient Details  Name: Marie Ramos MRN: [id number] Date of Birth: February 04, 1956 Referring Provider: Delice Lesch  Encounter Date: 10/12/2015      End of Session - 10/12/15 1041    Visit Number 6   Number of Visits 16   Date for SLP Re-Evaluation 11/19/15   Authorization Type 30 visit limit combined   SLP Start Time 0850   SLP Stop Time  0932   SLP Time Calculation (min) 42 min   Activity Tolerance Patient tolerated treatment well      Past Medical History  Diagnosis Date  . Bipolar 1 disorder ([place])   . Hypertrophic obstructive cardiomyopathy ([place]) 08/29/2015  . Cardiac arrest ([place]) 08/24/2015  . QT prolongation 08/29/2015  . TBI (traumatic brain injury) [facility]) 2011    Inpatient rehab [facility]  . Macular degeneration   . Chronically dry eyes   . Ventricular fibrillation ([place]) 09/03/15    MDT ICD Dr. Rayann Heman    Past Surgical History  Procedure Laterality Date  . Cardiac catheterization N/A 08/24/2015    Procedure: Left Heart Cath and Coronary Angiography;  Surgeon: Sherren Mocha, MD;  Location: [facility];  Service: Cardiovascular;  Laterality: N/A;  . Abdominal hysterectomy    . Tonsillectomy    . Splenectomy, total  2011  . Ep implantable device N/A 09/03/2015    MDT dual chamber ICD    There were no vitals filed for this visit.      Subjective Assessment - 10/12/15 0911    Subjective "I think my homework is right, but we'll see."               ADULT SLP TREATMENT - 10/12/15 0912    General Information   Behavior/Cognition Alert;Cooperative;Pleasant mood;Decreased sustained attention   Pain Assessment   Pain Assessment 0-10   Pain Score 2    Pain Location lt knee   Pain Descriptors / Indicators Sore   Pain Intervention(s) Monitored during session   Cognitive-Linquistic Treatment   Treatment focused on Cognition   Skilled Treatment Pt brought out homework right away for SLP. DEductive reasoning puzzle was WNL. In deductive reasoning tasks today to target reasoning and attention , with awareness. Pt required total A for emergent awareness. Impulsivity noted, pt req'd min-mod cues usually to reduce impulsivity   Assessment / Recommendations / Plan   Plan Continue with current plan of care   Progression Toward Goals   Progression toward goals Progressing toward goals            SLP Short Term Goals - 10/12/15 1042    SLP SHORT TERM GOAL #1   Title pt will demo emergent awareness in cognitive linguistic tasks to ID 80% of errors made over three sessions   Time 2   Period Weeks   Status On-going   SLP SHORT TERM GOAL #2   Title selective attention for 10 minutes in mod noisy environment with mod complex cognitive linguistic task   Time 2   Period Weeks   Status On-going   SLP SHORT TERM GOAL #3   Title pt will use memory compensation system for answering questions during therapy    Time 2   Period Weeks   Status On-going   SLP SHORT TERM GOAL #4   Title pt will tell SLP 3 non-physical deficits (related to cognition)  over 3 sessions   Time 2   Period Weeks   Status On-going          SLP Long Term Goals - 10/12/15 1043    SLP LONG TERM GOAL #1   Title Pt will demo awareness to correct 95% of errors on therapy tasks   Time 6   Period Weeks   Status On-going   SLP LONG TERM GOAL #2   Title pt will demo alternating attention with rare min A cues for redirection   Time 6   Period Weeks   Status On-going   SLP LONG TERM GOAL #3   Title pt will use memory compensations successfully PRN in 4 therapy sessions   Baseline one session 10-05-15   Time 6   Period Weeks   Status On-going   SLP LONG TERM GOAL #4   Title pt will demo anticipatory awareness in order to use compensaations for cognitive deficits in 8 therapy  sessions   Time 10   Period Weeks   Status On-going          Plan - 10/12/15 1042    Clinical Impression Statement Deficits in cognitive linguistics cont, and skilled ST cont'd recommended. Pt would benefit from cont'd skilled ST to assist pt in her possible return to work, and to reduce caregiver burden. However SLP wonders what pt's skills would have been like premorbidly without attention meds.   Speech Therapy Frequency 2x / week   Duration --  10 weeks   Treatment/Interventions Cognitive reorganization;SLP instruction and feedback;Compensatory strategies;Internal/external aids;Environmental controls;Patient/family education;Functional tasks;Cueing hierarchy   Potential to Achieve Goals Good   Potential Considerations Ability to learn/carryover information;Severity of impairments      Patient will benefit from skilled therapeutic intervention in order to improve the following deficits and impairments:   Cognitive communication deficit    Problem List Patient Active Problem List   Diagnosis Date Noted  . E. coli UTI 09/05/2015  . Anoxic brain injury ([place]) 09/05/2015  . Abnormality of gait   . Acute bilat watershed infarction [facility])   . Notalgia   . Acute systolic congestive heart failure ([place])   . Benign essential HTN   . Acute lower UTI   . AKI (acute kidney injury) ([place])   . History of traumatic brain injury   . Bipolar affective disorder in remission ([place])   . Leukocytosis   . Thrombocytosis ([place])   . Cerebral infarction due to embolism of cerebral artery ([place])   . Anoxic brain damage ([facility])   . Cough   . Essential hypertension   . Systolic dysfunction with acute on chronic heart failure ([place])   . Hypertrophic obstructive cardiomyopathy ([place]) 08/29/2015  . QT prolongation 08/29/2015  . Hypokalemia 08/29/2015  . Acute respiratory failure ([place])   . Cardiac arrest ([place]) 08/24/2015    [facility] ,[place], [place]  10/12/2015, 10:44 AM  [facility] [address] [place], [place], [address] Phone: [phone]   Fax:  [phone]   Name: Marie Ramos MRN: [id number] Date of Birth: 1956/01/09

## 2015-10-15 ENCOUNTER — Encounter: Payer: Self-pay | Admitting: Occupational Therapy

## 2015-10-15 ENCOUNTER — Ambulatory Visit: Payer: Self-pay | Admitting: Physical Therapy

## 2015-10-17 ENCOUNTER — Ambulatory Visit: Payer: BLUE CROSS/BLUE SHIELD

## 2015-10-17 ENCOUNTER — Ambulatory Visit: Payer: BLUE CROSS/BLUE SHIELD | Admitting: Occupational Therapy

## 2015-10-17 ENCOUNTER — Ambulatory Visit: Payer: BLUE CROSS/BLUE SHIELD | Admitting: Physical Therapy

## 2015-10-17 ENCOUNTER — Encounter: Payer: Self-pay | Admitting: Physical Therapy

## 2015-10-17 DIAGNOSIS — R41840 Attention and concentration deficit: Secondary | ICD-10-CM

## 2015-10-17 DIAGNOSIS — R2681 Unsteadiness on feet: Secondary | ICD-10-CM

## 2015-10-17 DIAGNOSIS — R531 Weakness: Secondary | ICD-10-CM

## 2015-10-17 DIAGNOSIS — R29898 Other symptoms and signs involving the musculoskeletal system: Secondary | ICD-10-CM

## 2015-10-17 DIAGNOSIS — R2689 Other abnormalities of gait and mobility: Secondary | ICD-10-CM

## 2015-10-17 DIAGNOSIS — R41842 Visuospatial deficit: Secondary | ICD-10-CM

## 2015-10-17 DIAGNOSIS — M6281 Muscle weakness (generalized): Secondary | ICD-10-CM

## 2015-10-17 DIAGNOSIS — R41841 Cognitive communication deficit: Secondary | ICD-10-CM

## 2015-10-17 DIAGNOSIS — R269 Unspecified abnormalities of gait and mobility: Secondary | ICD-10-CM

## 2015-10-17 NOTE — Addendum Note (Signed)
Addended by: Theone Murdoch B on: 10/17/2015 09:51 AM   Modules accepted: Orders

## 2015-10-17 NOTE — Therapy (Signed)
[facility] [address] [place], [place], [address] Phone: [phone]   Fax:  [phone]  Occupational Therapy Treatment  Patient Details  Name: Marie Ramos MRN: [id number] Date of Birth: 09-04-1955 Referring Provider: Dr. Posey Pronto  Encounter Date: 10/17/2015      OT End of Session - 10/17/15 1406    Visit Number 8   Number of Visits 17   Date for OT Re-Evaluation 11/18/15   Authorization Type BCBS   Authorization Time Period 30 visits combined   Authorization - Visit Number 8   Authorization - Number of Visits 10   OT Start Time 0935   OT Stop Time 1015   OT Time Calculation (min) 40 min   Activity Tolerance Patient tolerated treatment well   Behavior During Therapy [facility] for tasks assessed/performed      Past Medical History  Diagnosis Date  . Bipolar 1 disorder ([place])   . Hypertrophic obstructive cardiomyopathy ([place]) 08/29/2015  . Cardiac arrest ([place]) 08/24/2015  . QT prolongation 08/29/2015  . TBI (traumatic brain injury) [facility]) 2011    Inpatient rehab [facility]  . Macular degeneration   . Chronically dry eyes   . Ventricular fibrillation ([place]) 09/03/15    MDT ICD Dr. Rayann Heman    Past Surgical History  Procedure Laterality Date  . Cardiac catheterization N/A 08/24/2015    Procedure: Left Heart Cath and Coronary Angiography;  Surgeon: Sherren Mocha, MD;  Location: [facility];  Service: Cardiovascular;  Laterality: N/A;  . Abdominal hysterectomy    . Tonsillectomy    . Splenectomy, total  2011  . Ep implantable device N/A 09/03/2015    MDT dual chamber ICD    There were no vitals filed for this visit.      Subjective Assessment - 10/17/15 1002    Subjective  Denies pain   Pertinent History s/p recent pacemaker placement, see Epic, no lifting over 10 lbs   Patient Stated Goals to resume prior level of function   Currently in Pain? No/denies           Treatment: Pt completed a 24 piece  puzzle with improved speed and organization today while therapist checked pt homework.  2/4 items correct for functional word problems involving time.  Therapist reviewed problems with pt and she as able to correct with min v.c. Basic word problems for attention and problem solving. Pt correctly completed 9/12 problems. Pt to complete remainder as homework.                     OT Short Term Goals - 10/03/15 0947    OT SHORT TERM GOAL #1   Title I with HEP.   Time 4   Period Weeks   Status On-going   OT SHORT TERM GOAL #2   Title Pt will perform home management/ simple cooking at a supervision level demonstrating good safety awareness.   Time 4   Period Weeks   Status On-going   OT SHORT TERM GOAL #3   Title Pt will demonstrate improved activity tolerance as evidenced by performing functional activity/ home management in standing x 25 mins prior to rest break without LOB.   Time 4   Period Weeks   Status New   OT SHORT TERM GOAL #4   Title Pt will perform functional tabletop activities with a visual component with 90% or better accuracy.   Time 4   Period Weeks   Status On-going  OT SHORT TERM GOAL #5   Title Pt will perform a mod complex organizational task with 95% or better accuracy in prep for return to work.   Time 4   Period Weeks   Status New           OT Long Term Goals - 09/18/15 [id number]    OT LONG TERM GOAL #1   Title I with updated HEP.   Time 8   Period Weeks   Status New   OT LONG TERM GOAL #2   Title Pt will demonstrate ability to cook 2 items at a modified independent level simultaneously demonstrating good safety awareness.   Time 8   Period Weeks   Status New   OT LONG TERM GOAL #3   Title Pt will demonstrate ability to perform a physical and cognitive task simultaneously with 90% or better accuracy.   Time 8   Period Weeks   Status New   OT LONG TERM GOAL #4   Title Pt will demonstrate ability to navigate  a busy environment and  locate items with 90% or better accuracy.   Time 8   Period Weeks   Status New   OT LONG TERM GOAL #5   Title Pt will report that she has resumed functional activities / home management in standing x 45 mins or greater prior to rest break.   Time 8   Period Weeks   Status New               Plan - 10/17/15 1006    Clinical Impression Statement Pt is progressing towards goals yet she continues to demonstrate decreased organization and attention.   Rehab Potential Good   OT Frequency 2x / week   OT Duration 8 weeks   OT Treatment/Interventions Self-care/ADL training;Therapeutic exercise;Functional Mobility Training;Patient/family education;Balance training;Splinting;Manual Therapy;Neuromuscular education;Ultrasound;Energy conservation;Therapeutic exercises;Therapeutic activities;DME and/or AE instruction;Parrafin;Electrical Stimulation;Fluidtherapy;Gait Training;Cognitive remediation/compensation;Moist Heat;Contrast Bath;Passive range of motion;Visual/perceptual remediation/compensation   Plan cognition/ visual perceptual skills   Consulted and Agree with Plan of Care Patient      Patient will benefit from skilled therapeutic intervention in order to improve the following deficits and impairments:  Decreased coordination, Decreased safety awareness, Decreased endurance, Decreased activity tolerance, Decreased balance, Pain, Decreased cognition, Decreased strength, Impaired vision/preception  Visit Diagnosis: Attention and concentration deficit  Muscle weakness (generalized)  Visuospatial deficit    Problem List Patient Active Problem List   Diagnosis Date Noted  . E. coli UTI 09/05/2015  . Anoxic brain injury ([place]) 09/05/2015  . Abnormality of gait   . Acute bilat watershed infarction [facility])   . Notalgia   . Acute systolic congestive heart failure ([place])   . Benign essential HTN   . Acute lower UTI   . AKI (acute kidney injury) ([place])   . History of traumatic brain  injury   . Bipolar affective disorder in remission ([place])   . Leukocytosis   . Thrombocytosis ([place])   . Cerebral infarction due to embolism of cerebral artery ([place])   . Anoxic brain damage ([facility])   . Cough   . Essential hypertension   . Systolic dysfunction with acute on chronic heart failure ([place])   . Hypertrophic obstructive cardiomyopathy ([place]) 08/29/2015  . QT prolongation 08/29/2015  . Hypokalemia 08/29/2015  . Acute respiratory failure ([place])   . Cardiac arrest ([place]) 08/24/2015    RINE,KATHRYN 10/17/2015, 2:20 PM Theone Murdoch, OTR/L [phone] Phone: [phone] 2:21 PM 10/17/2015 [facility] [address]  [place], [place], [address] Phone: [phone]   Fax:  [phone]  Name: GRETEL VINK MRN: [id number] Date of Birth: Nov 13, 1955

## 2015-10-17 NOTE — Therapy (Addendum)
[facility] [address] [place], [place], [address] Phone: [phone]   Fax:  [phone]  Physical Therapy Treatment  Patient Details  Name: Marie Ramos MRN: [id number] Date of Birth: 1956/04/19 Referring Provider: Dr. Posey Pronto  Encounter Date: 10/17/2015      PT End of Session - 10/17/15 1221    Visit Number 7   Number of Visits 9   Date for PT Re-Evaluation 10/21/15   Authorization Type BCBS (30 visit limit) emailed Lattie Haw regarding if 30 visit limit applies to all 3 disciplines.   PT Start Time 0848   PT Stop Time [phone]   PT Time Calculation (min) 50 min   Equipment Utilized During Treatment Gait belt   Activity Tolerance Patient tolerated treatment well   Behavior During Therapy WFL for tasks assessed/performed      Past Medical History  Diagnosis Date  . Bipolar 1 disorder ([place])   . Hypertrophic obstructive cardiomyopathy ([place]) 08/29/2015  . Cardiac arrest ([place]) 08/24/2015  . QT prolongation 08/29/2015  . TBI (traumatic brain injury) [facility]) 2011    Inpatient rehab [facility]  . Macular degeneration   . Chronically dry eyes   . Ventricular fibrillation ([place]) 09/03/15    MDT ICD Dr. Rayann Heman    Past Surgical History  Procedure Laterality Date  . Cardiac catheterization N/A 08/24/2015    Procedure: Left Heart Cath and Coronary Angiography;  Surgeon: Sherren Mocha, MD;  Location: [facility];  Service: Cardiovascular;  Laterality: N/A;  . Abdominal hysterectomy    . Tonsillectomy    . Splenectomy, total  2011  . Ep implantable device N/A 09/03/2015    MDT dual chamber ICD    There were no vitals filed for this visit.      Subjective Assessment - 10/17/15 0851    Subjective Pt reports that infection is better.   Currently in Pain? Yes   Pain Score 2    Pain Location Knee   Pain Orientation Left   Pain Descriptors / Indicators Aching   Pain Onset In the past 7 days            [facility] PT  Assessment - 10/17/15 0001    6 minute walk test results    Aerobic Endurance Distance Walked 1210'   Endurance additional comments No LOB, Ind.   Berg Balance Test   Sit to Stand Able to stand without using hands and stabilize independently   Standing Unsupported Able to stand safely 2 minutes   Sitting with Back Unsupported but Feet Supported on Floor or Stool Able to sit safely and securely 2 minutes   Stand to Sit Sits safely with minimal use of hands   Transfers Able to transfer safely, minor use of hands   Standing Unsupported with Eyes Closed Able to stand 10 seconds safely   Standing Ubsupported with Feet Together Able to place feet together independently and stand 1 minute safely   From Standing, Reach Forward with Outstretched Arm Can reach confidently >25 cm (10")   From Standing Position, Pick up Object from Floor Able to pick up shoe safely and easily   From Standing Position, Turn to Look Behind Over each Shoulder Looks behind from both sides and weight shifts well   Turn 360 Degrees Able to turn 360 degrees safely in 4 seconds or less   Standing Unsupported, Alternately Place Feet on Step/Stool Able to stand independently and safely and complete 8 steps in 20 seconds  Standing Unsupported, One Foot in Sun to place foot tandem independently and hold 30 seconds   Standing on One Leg Tries to lift leg/unable to hold 3 seconds but remains standing independently   Total Score 53                     [facility] Adult PT Treatment/Exercise - 10/17/15 0001    Ambulation/Gait   Ambulation/Gait Yes   Ambulation/Gait Assistance 7: Independent   Ambulation/Gait Assistance Details No LOB, domonstrated confidence in mobility   Ambulation Distance (Feet) 1000 Feet   Assistive device None   Gait Pattern Step-through pattern;Decreased trunk rotation;Trunk flexed   Ambulation Surface Unlevel;Outdoor;Paved;Grass                PT Education - 10/17/15 1220     Education provided Yes   Education Details Discussed results of goals checked.   Person(s) Educated Patient   Methods Explanation   Comprehension Verbalized understanding          PT Short Term Goals - 09/22/15 1340    PT SHORT TERM GOAL #1   Title same as LTGs.           PT Long Term Goals - 10/17/15 1222    PT LONG TERM GOAL #1   Title Pt will be IND in HEP to improve deficits listed above. Target date: 10/19/15   Baseline checked 10/17/15   Status Achieved   PT LONG TERM GOAL #2   Title Perform 6-minute walk test and write goal. Target date: 10/19/15   Baseline checked 10/17/15; 1210' Ind.   Status Achieved   PT LONG TERM GOAL #3   Title Pt will improve BERG score to >/=52/56 to decr. falls risk. Target date: 10/19/15   Baseline 53/56; checked 10/17/15   Status Achieved   PT LONG TERM GOAL #4   Title Pt will amb. 1000' over even/uneven terrain IND in order to improve functional mobility and to amb. at work (schools). Target date: 10/19/15   Baseline checked 10/17/15   Status Achieved   PT LONG TERM GOAL #5   Title Pt will improve Neuro QoL: LE score to >/=50 to improve quality of life. Target date: 10/19/15   Baseline score 68; checked 10/17/15   Status Achieved   PT LONG TERM GOAL #6   Title Pt will perform 6-minute walk test with >/= 1155' in order to improve endurance. Target date: 10/19/15   Baseline checked 10/17/15; 1210'   Status Achieved               Plan - 10/17/15 1225    Clinical Impression Statement Pt met all LTGs and is independently active in the community.   Rehab Potential Good   Clinical Impairments Affecting Rehab Potential co-morbidities   PT Frequency 2x / week   PT Duration 4 weeks   PT Treatment/Interventions ADLs/Self Care Home Management;Neuromuscular re-education;Biofeedback;Electrical Stimulation;Balance training;Manual techniques;Therapeutic exercise;Therapeutic activities;Orthotic Fit/Training;Functional mobility training;Stair  training;Gait training;DME Instruction;Patient/family education;Cognitive remediation   PT Next Visit Plan send d/c summary to MD.   PT Home Exercise Plan Balance and strengthening.    Consulted and Agree with Plan of Care Patient;Family member/caregiver   Family Member Consulted pt's husband: [name]      Patient will benefit from skilled therapeutic intervention in order to improve the following deficits and impairments:  Abnormal gait, Decreased endurance, Decreased cognition, Decreased balance, Decreased mobility, Decreased knowledge of use of DME, Decreased strength, Pain  Visit Diagnosis: Other abnormalities  of gait and mobility  Muscle weakness (generalized)  Decreased strength  Abnormality of gait  Unsteadiness  Weakness of both lower extremities     Problem List Patient Active Problem List   Diagnosis Date Noted  . E. coli UTI 09/05/2015  . Anoxic brain injury ([place]) 09/05/2015  . Abnormality of gait   . Acute bilat watershed infarction [facility])   . Notalgia   . Acute systolic congestive heart failure ([place])   . Benign essential HTN   . Acute lower UTI   . Ramos (acute kidney injury) ([place])   . History of traumatic brain injury   . Bipolar affective disorder in remission ([place])   . Leukocytosis   . Thrombocytosis ([place])   . Cerebral infarction due to embolism of cerebral artery ([place])   . Anoxic brain damage ([facility])   . Cough   . Essential hypertension   . Systolic dysfunction with acute on chronic heart failure ([place])   . Hypertrophic obstructive cardiomyopathy ([place]) 08/29/2015  . QT prolongation 08/29/2015  . Hypokalemia 08/29/2015  . Acute respiratory failure ([place])   . Cardiac arrest [facility]) 08/24/2015    Bjorn Loser, PTA  10/17/2015, 12:29 PM [facility] [address] [place], [place], [address] Phone: [phone]   Fax:  [phone]  Name: Marie Ramos MRN: [id number] Date of Birth:  1955/10/22   PHYSICAL THERAPY DISCHARGE SUMMARY  Visits from Start of Care: 7  Current functional level related to goals / functional outcomes:     PT Long Term Goals - 10/17/15 1222    PT LONG TERM GOAL #1   Title Pt will be IND in HEP to improve deficits listed above. Target date: 10/19/15   Baseline checked 10/17/15   Status Achieved   PT LONG TERM GOAL #2   Title Perform 6-minute walk test and write goal. Target date: 10/19/15   Baseline checked 10/17/15   Status Achieved   PT LONG TERM GOAL #3   Title Pt will improve BERG score to >/=52/56 to decr. falls risk. Target date: 10/19/15   Baseline 53/56; checked 10/17/15   Status Achieved   PT LONG TERM GOAL #4   Title Pt will amb. 1000' over even/uneven terrain IND in order to improve functional mobility and to amb. at work (schools). Target date: 10/19/15   Baseline checked 10/17/15   Status Achieved   PT LONG TERM GOAL #5   Title Pt will improve Neuro QoL: LE score to >/=50 to improve quality of life. Target date: 10/19/15   Baseline score 68; checked 10/17/15   Status Achieved   PT LONG TERM GOAL #6   Title Pt will perform 6-minute walk test with >/= 1155' in order to improve endurance. Target date: 10/19/15   Baseline checked 10/17/15   Status Achieved        Remaining deficits: Occasional LOB during high level balance activities over compliant surfaces.   Education / Equipment: HEP  Plan: Patient agrees to discharge.  Patient goals were met. Patient is being discharged due to meeting the stated rehab goals.  ?????        Pt d/c completed by PT.  Geoffry Paradise, PT,DPT 10/18/2015 2:15 PM Phone: [phone] Fax: [phone]

## 2015-10-17 NOTE — Therapy (Signed)
[facility] [address] [place], [place], [address] Phone: [phone]   Fax:  [phone]  Speech Language Pathology Treatment  Patient Details  Name: Marie Ramos MRN: [id number] Date of Birth: 09-23-1955 Referring Provider: Delice Lesch  Encounter Date: 10/17/2015      End of Session - 10/17/15 0927    Visit Number 7   Number of Visits 16   Date for SLP Re-Evaluation 11/19/15   SLP Start Time 0805   SLP Stop Time  0847   SLP Time Calculation (min) 42 min   Activity Tolerance Patient tolerated treatment well      Past Medical History  Diagnosis Date  . Bipolar 1 disorder ([place])   . Hypertrophic obstructive cardiomyopathy ([place]) 08/29/2015  . Cardiac arrest ([place]) 08/24/2015  . QT prolongation 08/29/2015  . TBI (traumatic brain injury) [facility]) 2011    Inpatient rehab [facility]  . Macular degeneration   . Chronically dry eyes   . Ventricular fibrillation ([place]) 09/03/15    MDT ICD Dr. Rayann Heman    Past Surgical History  Procedure Laterality Date  . Cardiac catheterization N/A 08/24/2015    Procedure: Left Heart Cath and Coronary Angiography;  Surgeon: Sherren Mocha, MD;  Location: [facility];  Service: Cardiovascular;  Laterality: N/A;  . Abdominal hysterectomy    . Tonsillectomy    . Splenectomy, total  2011  . Ep implantable device N/A 09/03/2015    MDT dual chamber ICD    There were no vitals filed for this visit.      Subjective Assessment - 10/17/15 0826    Subjective Pt reports psychiatrist and cardiologist will talk re: attention med.               ADULT SLP TREATMENT - 10/17/15 0829    General Information   Behavior/Cognition Decreased sustained attention;Alert;Cooperative;Pleasant mood   Pain Assessment   Pain Assessment No/denies pain   Cognitive-Linquistic Treatment   Treatment focused on Cognition   Skilled Treatment Pt brought homework and presented to SLP. She and SLP worked  on homework pt did not get to. She req'd mod A for attention (selective and alternating) usually during all tasks today. She used the write it down strategy three times today during the session.   Assessment / Recommendations / Plan   Plan Continue with current plan of care   Progression Toward Goals   Progression toward goals Progressing toward goals            SLP Short Term Goals - 10/17/15 [id number]    SLP SHORT TERM GOAL #1   Title pt will demo emergent awareness in cognitive linguistic tasks to ID 80% of errors made over three sessions   Time 1   Period Weeks   Status On-going   SLP SHORT TERM GOAL #2   Title selective attention for 10 minutes in mod noisy environment with mod complex cognitive linguistic task   Time 1   Period Weeks   Status On-going   SLP SHORT TERM GOAL #3   Title pt will use memory compensation system for answering questions during therapy    Status Achieved   SLP SHORT TERM GOAL #4   Title pt will tell SLP 3 non-physical deficits (related to cognition) over 3 sessions   Time 1   Period Weeks   Status On-going          SLP Long Term Goals - 10/17/15 0930    SLP LONG TERM  GOAL #1   Title Pt will demo awareness to correct 95% of errors on therapy tasks   Time 5   Period Weeks   Status On-going   SLP LONG TERM GOAL #2   Title pt will demo alternating attention with rare min A cues for redirection   Time 5   Period Weeks   Status On-going   SLP LONG TERM GOAL #3   Title pt will use memory compensations successfully PRN in 4 therapy sessions   Baseline one session 10-05-15   Time 5   Period Weeks   Status On-going   SLP LONG TERM GOAL #4   Title pt will demo anticipatory awareness in order to use compensaations for cognitive deficits in 8 therapy sessions   Time 9   Period Weeks   Status On-going          Plan - 10/17/15 [id number]    Clinical Impression Statement Deficits in cognitive linguistics cont, and skilled ST cont'd recommended. Pt  would benefit from cont'd skilled ST to assist pt in her possible return to work, and to reduce caregiver burden. However SLP wonders what pt's skills would have been like premorbidly without attention meds.   Speech Therapy Frequency 2x / week   Duration --  9 weeks   Treatment/Interventions Cognitive reorganization;SLP instruction and feedback;Compensatory strategies;Internal/external aids;Environmental controls;Patient/family education;Functional tasks;Cueing hierarchy   Potential to Achieve Goals Good   Potential Considerations Ability to learn/carryover information;Severity of impairments;Previous level of function      Patient will benefit from skilled therapeutic intervention in order to improve the following deficits and impairments:   Cognitive communication deficit    Problem List Patient Active Problem List   Diagnosis Date Noted  . E. coli UTI 09/05/2015  . Anoxic brain injury ([place]) 09/05/2015  . Abnormality of gait   . Acute bilat watershed infarction [facility])   . Notalgia   . Acute systolic congestive heart failure ([place])   . Benign essential HTN   . Acute lower UTI   . AKI (acute kidney injury) ([place])   . History of traumatic brain injury   . Bipolar affective disorder in remission ([place])   . Leukocytosis   . Thrombocytosis ([place])   . Cerebral infarction due to embolism of cerebral artery ([place])   . Anoxic brain damage ([facility])   . Cough   . Essential hypertension   . Systolic dysfunction with acute on chronic heart failure ([place])   . Hypertrophic obstructive cardiomyopathy ([place]) 08/29/2015  . QT prolongation 08/29/2015  . Hypokalemia 08/29/2015  . Acute respiratory failure ([place])   . Cardiac arrest ([place]) 08/24/2015    [facility] ,[place], [place]  10/17/2015, 9:31 AM  [facility] [address] [place], [place], [address] Phone: [phone]   Fax:  [phone]   Name: Marie Ramos MRN:  [id number] Date of Birth: 10-23-1955

## 2015-10-22 ENCOUNTER — Telehealth: Payer: Self-pay | Admitting: *Deleted

## 2015-10-22 ENCOUNTER — Ambulatory Visit (INDEPENDENT_AMBULATORY_CARE_PROVIDER_SITE_OTHER): Payer: BLUE CROSS/BLUE SHIELD | Admitting: Internal Medicine

## 2015-10-22 ENCOUNTER — Encounter: Payer: Self-pay | Admitting: Internal Medicine

## 2015-10-22 VITALS — BP 150/94 | HR 101 | Ht 62.0 in | Wt 236.4 lb

## 2015-10-22 DIAGNOSIS — I429 Cardiomyopathy, unspecified: Secondary | ICD-10-CM

## 2015-10-22 DIAGNOSIS — I10 Essential (primary) hypertension: Secondary | ICD-10-CM | POA: Diagnosis not present

## 2015-10-22 LAB — CUP PACEART INCLINIC DEVICE CHECK
Battery Remaining Longevity: 133 mo
Brady Statistic AP VS Percent: 0.1 % — CL
Date Time Interrogation Session: 20170417101043
HIGH POWER IMPEDANCE MEASURED VALUE: 64 Ohm
Implantable Lead Implant Date: 20170227
Implantable Lead Model: 5076
Lead Channel Pacing Threshold Amplitude: 0.5 V
Lead Channel Pacing Threshold Amplitude: 0.75 V
Lead Channel Sensing Intrinsic Amplitude: 4.6 mV
Lead Channel Setting Pacing Amplitude: 3.5 V
Lead Channel Setting Sensing Sensitivity: 0.3 mV
MDC IDC LEAD IMPLANT DT: 20170227
MDC IDC LEAD LOCATION: 753859
MDC IDC LEAD LOCATION: 753860
MDC IDC MSMT LEADCHNL RA IMPEDANCE VALUE: 475 Ohm
MDC IDC MSMT LEADCHNL RA PACING THRESHOLD PULSEWIDTH: 0.4 ms
MDC IDC MSMT LEADCHNL RV IMPEDANCE VALUE: 532 Ohm
MDC IDC MSMT LEADCHNL RV PACING THRESHOLD PULSEWIDTH: 0.4 ms
MDC IDC MSMT LEADCHNL RV SENSING INTR AMPL: 20 mV — AB
MDC IDC SET LEADCHNL RA PACING AMPLITUDE: 3.5 V
MDC IDC SET LEADCHNL RV PACING PULSEWIDTH: 0.4 ms
MDC IDC STAT BRADY AP VP PERCENT: 0.1 % — AB
MDC IDC STAT BRADY AS VP PERCENT: 0.1 % — AB
MDC IDC STAT BRADY AS VS PERCENT: 100 %

## 2015-10-22 MED ORDER — CARVEDILOL 12.5 MG PO TABS
12.5000 mg | ORAL_TABLET | Freq: Two times a day (BID) | ORAL | Status: DC
Start: 2015-10-22 — End: 2015-10-22

## 2015-10-22 MED ORDER — CARVEDILOL 12.5 MG PO TABS: 6.2500 mg | ORAL_TABLET | Freq: Two times a day (BID) | ORAL | Status: DC

## 2015-10-22 NOTE — Telephone Encounter (Signed)
Spoke with patient and she was and is very confused about her medications. Thinks the Carvedilol was stopped due to cough by Dr Harrington Challenger.  Upon looking into medication history I see she was on Lisinopril.  I told her I would call the office and get back with her.  She has not been taking the Carvedilol for several weeks.  I think the Lisinopril was stopped and the Losartan was started in place but will call Dr Harrington Challenger and confirm.  After calling office this is correct.  I will have patient restart the Carvedilol at 6.25 mg twice and keep 2 week follow up appointment

## 2015-10-22 NOTE — Progress Notes (Signed)
PCP:  Melinda Crutch, MD    Marie Ramos is a 60 y.o. female who presents today for electrophysiology followup.  Her device pocket has nearly healed.  No further drainage.  Denies warmth/ pain.  No fevers or chills.  Continues to work with psychiatry for bipolar disorder.  The patient is otherwise without complaint today.   Past Medical History  Diagnosis Date  . Bipolar 1 disorder ([place])   . Hypertrophic obstructive cardiomyopathy ([place]) 08/29/2015  . Cardiac arrest ([place]) 08/24/2015  . QT prolongation 08/29/2015  . TBI (traumatic brain injury) [facility]) 2011    Inpatient rehab [facility]  . Macular degeneration   . Chronically dry eyes   . Ventricular fibrillation ([place]) 09/03/15    MDT ICD Dr. Rayann Heman   Past Surgical History  Procedure Laterality Date  . Cardiac catheterization N/A 08/24/2015    Procedure: Left Heart Cath and Coronary Angiography;  Surgeon: Sherren Mocha, MD;  Location: [facility];  Service: Cardiovascular;  Laterality: N/A;  . Abdominal hysterectomy    . Tonsillectomy    . Splenectomy, total  2011  . Ep implantable device N/A 09/03/2015    MDT dual chamber ICD    ROS- all systems are reviewed and negative except as per HPI above  Current Outpatient Prescriptions  Medication Sig Dispense Refill  . acetaminophen (TYLENOL) 325 MG tablet Take 1-2 tablets (325-650 mg total) by mouth every 4 (four) hours as needed for mild pain.    Marland Kitchen ALPRAZolam (XANAX) 0.25 MG tablet Take 1 tablet (0.25 mg total) by mouth at bedtime as needed for anxiety or sleep. 30 tablet 0  . amLODipine (NORVASC) 5 MG tablet Take 1 tablet (5 mg total) by mouth daily with breakfast. 30 tablet 0  . apixaban (ELIQUIS) 5 MG TABS tablet Take 1 tablet (5 mg total) by mouth 2 (two) times daily. 60 tablet 0  . atorvastatin (LIPITOR) 20 MG tablet Take 1 tablet (20 mg total) by mouth daily at 6 PM. 30 tablet 0  . benzonatate (TESSALON) 100 MG capsule Take 1 capsule (100 mg total) by mouth at bedtime. 20 capsule 0   . carvedilol (COREG) 6.25 MG tablet Take 1 tablet (6.25 mg total) by mouth 2 (two) times daily with a meal. With breakfast and supper 60 tablet 0  . cephALEXin (KEFLEX) 500 MG capsule Take 1 capsule (500 mg total) by mouth 3 (three) times daily. 42 capsule 0  . famotidine (PEPCID) 20 MG tablet Take 20 mg by mouth at bedtime.    Marland Kitchen losartan (COZAAR) 50 MG tablet Take 50 mg by mouth daily.   0  . multivitamin (PROSIGHT) TABS tablet Take 1 tablet by mouth daily. 30 each 0  . Oxcarbazepine (TRILEPTAL) 300 MG tablet Take 1 tablet (300 mg total) by mouth 2 (two) times daily. 60 tablet 0  . traMADol (ULTRAM) 50 MG tablet Take 1 tablet (50 mg total) by mouth every 12 (twelve) hours as needed for moderate pain. 30 tablet 0  . venlafaxine XR (EFFEXOR-XR) 75 MG 24 hr capsule Take 75 mg by mouth daily.  4   No current facility-administered medications for this visit.    Physical Exam: Filed Vitals:   10/22/15 0917  BP: 188/92  Pulse: 101  Height: 5\' 2"  (1.575 m)  Weight: 236 lb 6.4 oz (107.23 kg)    GEN- The patient is well appearing, alert and oriented x 3 today.   Head- normocephalic, atraumatic Eyes-  Sclera clear, conjunctiva pink Ears- hearing intact  Oropharynx- clear Lungs- Clear to ausculation bilaterally, normal work of breathing Chest- ICD pocket has a small scab over the incision line.  No fluctuance, redness, warmth/ tenderness Heart- Regular rate and rhythm, no murmurs, rubs or gallops, PMI not laterally displaced GI- soft, NT, ND, + BS Extremities- no clubbing, cyanosis, or edema  ICD interrogation- reviewed in detail today,  See PACEART report  Assessment and Plan:  1.  S/p VF arrest  ICD incision line infection is much better with ceflex x 2 weeks Will return for repeat exam in 2 weeks Normal ICD function See Pace Art report No changes today No driving (pt aware)  2. HTN Elevated BP today  Repeat by MD is 150/94 lifestyle modification encouraged Increase coreg to  12.5mg  BID today Increase losartan in the future if needed  3. Prolonged QT Stable No change required today  4. CVA  C/w neurlogy  5. LLE DVT  defer to PMD, on Eliquis  6. Bipolar disorder Avoid QT prolonging drugs Defer to psych Would avoid xanax long term as able given its addictive properties  Return for repeat wound check with me in 2 weeks  Thompson Grayer MD, [facility] 10/22/2015 9:55 AM

## 2015-10-22 NOTE — Patient Instructions (Addendum)
Medication Instructions:  Your physician has recommended you make the following change in your medication:  1) Restart Carvedilol 6.25 mg twice daily    Labwork: None ordered   Testing/Procedures: None ordered   Follow-Up: Your physician recommends that you schedule a follow-up appointment in: 2 weeks with Dr Rayann Heman   Any Other Special Instructions Will Be Listed Below (If Applicable).     If you need a refill on your cardiac medications before your next appointment, please call your pharmacy.

## 2015-10-22 NOTE — Telephone Encounter (Signed)
Per patient, her pcp stopped the carvedilol some time ago due to cough. She forgot to mention this at her visit today. She can be reached at [phone]. Please advise. Thanks, MI

## 2015-10-26 ENCOUNTER — Ambulatory Visit: Payer: BLUE CROSS/BLUE SHIELD

## 2015-10-26 ENCOUNTER — Telehealth: Payer: Self-pay | Admitting: Internal Medicine

## 2015-10-26 ENCOUNTER — Ambulatory Visit: Payer: BLUE CROSS/BLUE SHIELD | Admitting: Occupational Therapy

## 2015-10-26 DIAGNOSIS — R41844 Frontal lobe and executive function deficit: Secondary | ICD-10-CM

## 2015-10-26 DIAGNOSIS — R41840 Attention and concentration deficit: Secondary | ICD-10-CM

## 2015-10-26 DIAGNOSIS — R41842 Visuospatial deficit: Secondary | ICD-10-CM | POA: Diagnosis not present

## 2015-10-26 DIAGNOSIS — R41841 Cognitive communication deficit: Secondary | ICD-10-CM

## 2015-10-26 DIAGNOSIS — M6281 Muscle weakness (generalized): Secondary | ICD-10-CM

## 2015-10-26 NOTE — Telephone Encounter (Signed)
New message   Pt wants to know if Dr.Allred has spoken to her psychologist

## 2015-10-26 NOTE — Therapy (Signed)
[facility] [address] [place], [place], [address] Phone: [phone]   Fax:  [phone]  Occupational Therapy Treatment  Patient Details  Name: Marie Ramos MRN: [id number] Date of Birth: 1956/06/13 Referring Provider: Dr. Posey Pronto  Encounter Date: 10/26/2015      OT End of Session - 10/26/15 1215    Visit Number 9   Number of Visits 17   Date for OT Re-Evaluation 11/18/15   Authorization Type [employer]   Authorization Time Period 30 visits combined   Authorization - Visit Number 9   Authorization - Number of Visits 10   OT Start Time 0850   OT Stop Time 0930   OT Time Calculation (min) 40 min   Activity Tolerance Patient tolerated treatment well   Behavior During Therapy [facility] for tasks assessed/performed      Past Medical History  Diagnosis Date  . Bipolar 1 disorder ([place])   . Hypertrophic obstructive cardiomyopathy ([place]) 08/29/2015  . Cardiac arrest ([place]) 08/24/2015  . QT prolongation 08/29/2015  . TBI (traumatic brain injury) [facility]) 2011    Inpatient rehab [facility]  . Macular degeneration   . Chronically dry eyes   . Ventricular fibrillation ([place]) 09/03/15    MDT ICD Dr. Rayann Heman    Past Surgical History  Procedure Laterality Date  . Cardiac catheterization N/A 08/24/2015    Procedure: Left Heart Cath and Coronary Angiography;  Surgeon: Sherren Mocha, MD;  Location: [facility];  Service: Cardiovascular;  Laterality: N/A;  . Abdominal hysterectomy    . Tonsillectomy    . Splenectomy, total  2011  . Ep implantable device N/A 09/03/2015    MDT dual chamber ICD    There were no vitals filed for this visit.      Completing a 24 piece puzzle with min v.c. To slow down and for organization. Picking up and manipulating coins in hand and stacking with LUE, min difficulty/ v.c. Pt performed a simple cooking task to locate items and  fry an egg, demonstrating good safety awareness. Pt recalled to turn off  stove without v.c. Therapist made 1 recommendation that pt leaves the pan on another burner to allow it to cool before washing.                                     OT Short Term Goals - 10/26/15 [id number]    OT SHORT TERM GOAL #1   Title I with HEP.   Time 4   Period Weeks   Status Achieved   OT SHORT TERM GOAL #2   Title Pt will perform home management/ simple cooking at a supervision level demonstrating good safety awareness.   Status On-going   OT SHORT TERM GOAL #3   Title Pt will demonstrate improved activity tolerance as evidenced by performing functional activity/ home management in standing x 25 mins prior to rest break without LOB.   Status On-going   OT SHORT TERM GOAL #4   Title Pt will perform functional tabletop activities with a visual component with 90% or better accuracy.   Status On-going           OT Long Term Goals - 09/18/15 0925    OT LONG TERM GOAL #1   Title I with updated HEP.   Time 8   Period Weeks   Status New   OT LONG TERM GOAL #2  Title Pt will demonstrate ability to cook 2 items at a modified independent level simultaneously demonstrating good safety awareness.   Time 8   Period Weeks   Status New   OT LONG TERM GOAL #3   Title Pt will demonstrate ability to perform a physical and cognitive task simultaneously with 90% or better accuracy.   Time 8   Period Weeks   Status New   OT LONG TERM GOAL #4   Title Pt will demonstrate ability to navigate  a busy environment and locate items with 90% or better accuracy.   Time 8   Period Weeks   Status New   OT LONG TERM GOAL #5   Title Pt will report that she has resumed functional activities / home management in standing x 45 mins or greater prior to rest break.   Time 8   Period Weeks   Status New               Plan - 10/26/15 1214    Clinical Impression Statement Pt is progressing towards goals. Pt/ husband agree with plans to place therapy on hold until  pt starts new medication for ADD. (Pt was on medication previously and it was discontinued with the CVA)   Rehab Potential Good   OT Frequency 2x / week   OT Duration 8 weeks   OT Treatment/Interventions Self-care/ADL training;Therapeutic exercise;Functional Mobility Training;Patient/family education;Balance training;Splinting;Manual Therapy;Neuromuscular education;Ultrasound;Energy conservation;Therapeutic exercises;Therapeutic activities;DME and/or AE instruction;Parrafin;Electrical Stimulation;Fluidtherapy;Gait Training;Cognitive remediation/compensation;Moist Heat;Contrast Bath;Passive range of motion;Visual/perceptual remediation/compensation   Plan Place pt on hol for up to 30 days awaiting new ADD medication to start, pt to call to schedule appts PRN.   Consulted and Agree with Plan of Care Patient   Family Member Consulted husband      Patient will benefit from skilled therapeutic intervention in order to improve the following deficits and impairments:  Decreased coordination, Decreased safety awareness, Decreased endurance, Decreased activity tolerance, Decreased balance, Pain, Decreased cognition, Decreased strength, Impaired vision/preception  Visit Diagnosis: Attention and concentration deficit  Visuospatial deficit  Frontal lobe and executive function deficit  Muscle weakness (generalized)    Problem List Patient Active Problem List   Diagnosis Date Noted  . E. coli UTI 09/05/2015  . Anoxic brain injury ([place]) 09/05/2015  . Abnormality of gait   . Acute bilat watershed infarction [facility])   . Notalgia   . Acute systolic congestive heart failure ([place])   . Benign essential HTN   . Acute lower UTI   . AKI (acute kidney injury) ([place])   . History of traumatic brain injury   . Bipolar affective disorder in remission ([place])   . Leukocytosis   . Thrombocytosis ([place])   . Cerebral infarction due to embolism of cerebral artery ([place])   . Anoxic brain damage ([facility])   . Cough   .  Essential hypertension   . Systolic dysfunction with acute on chronic heart failure ([place])   . Hypertrophic obstructive cardiomyopathy ([place]) 08/29/2015  . QT prolongation 08/29/2015  . Hypokalemia 08/29/2015  . Acute respiratory failure ([place])   . Cardiac arrest ([place]) 08/24/2015    Tavyn Kurka 10/26/2015, 12:16 PM Theone Murdoch, OTR/L [phone] Phone: [phone] 12:20 PM 10/26/2015 [facility] [address] [place], [place], [address] Phone: [phone]   Fax:  [phone]  Name: Marie Ramos MRN: [id number] Date of Birth: 03-15-1956

## 2015-10-26 NOTE — Telephone Encounter (Signed)
I have not spoken with her psychologist and do not have their name or information.  I had previously told her that I would be happy to speak with them if they called me.

## 2015-10-26 NOTE — Therapy (Signed)
[facility] [address] [place], [place], [address] Phone: [phone]   Fax:  [phone]  Speech Language Pathology Treatment  Patient Details  Name: Marie Ramos MRN: [id number] Date of Birth: Aug 21, 1955 Referring Provider: Delice Lesch  Encounter Date: 10/26/2015      End of Session - 10/26/15 1352    Visit Number 8   Number of Visits 16   Date for SLP Re-Evaluation 11/19/15   Authorization - Number of Visits 31   SLP Start Time 0806   SLP Stop Time  0846   SLP Time Calculation (min) 40 min   Activity Tolerance Patient tolerated treatment well      Past Medical History  Diagnosis Date  . Bipolar 1 disorder ([place])   . Hypertrophic obstructive cardiomyopathy ([place]) 08/29/2015  . Cardiac arrest ([place]) 08/24/2015  . QT prolongation 08/29/2015  . TBI (traumatic brain injury) [facility]) 2011    Inpatient rehab [facility]  . Macular degeneration   . Chronically dry eyes   . Ventricular fibrillation ([place]) 09/03/15    MDT ICD Dr. Rayann Heman    Past Surgical History  Procedure Laterality Date  . Cardiac catheterization N/A 08/24/2015    Procedure: Left Heart Cath and Coronary Angiography;  Surgeon: Sherren Mocha, MD;  Location: [facility];  Service: Cardiovascular;  Laterality: N/A;  . Abdominal hysterectomy    . Tonsillectomy    . Splenectomy, total  2011  . Ep implantable device N/A 09/03/2015    MDT dual chamber ICD    There were no vitals filed for this visit.      Subjective Assessment - 10/26/15 0818    Subjective Pt does not know if cardiologist and psychiatrist have spoken about meds.   Currently in Pain? No/denies               ADULT SLP TREATMENT - 10/26/15 0826    General Information   Behavior/Cognition Decreased sustained attention;Alert;Cooperative;Pleasant mood   Cognitive-Linquistic Treatment   Treatment focused on Cognition   Skilled Treatment Homework presented to [place] today with  homework out. 100% success. Mild impulsivity and hyperverbosity noted when going over homework. Impulsivity did not noticably affect overall success, however verbosity frequently took pt's attention off of task at hand. Alternating attention skills targeted (with request for pt to be silent) between following oral directions requiring pt to get up from table, and written directions followed on worksheet at the table. Pt stated she had slight more difficulty returning to one task over the other. Min noticable difference to SLP, and pt remained fairly silent during this 8-9 minute period. Talked to pt about saving remaining visits in case she still has attention/cognitive deficts after returning another attention medication. Pt stated she would prefer this option of being put on hold.    Assessment / Recommendations / Plan   Plan Other (Comment)  put on hold for approx 30 days; when new ADD med she'll call   Progression Toward Goals   Progression toward goals Progressing toward goals          SLP Education - 10/26/15 1351    Education provided Yes   Education Details putting pt on hold until new ADD med started   [employer]) Educated Patient   Methods Explanation   Comprehension Verbalized understanding;Need further instruction          SLP Short Term Goals - 10/26/15 1355    SLP SHORT TERM GOAL #1   Title pt will demo  emergent awareness in cognitive linguistic tasks to ID 80% of errors made over three sessions   Time 1   Period Weeks   Status Not Met   SLP SHORT TERM GOAL #2   Title selective attention for 10 minutes in mod noisy environment with mod complex cognitive linguistic task   Time 1   Period Weeks   Status Not Met   SLP SHORT TERM GOAL #3   Title pt will use memory compensation system for answering questions during therapy    Status Achieved   SLP SHORT TERM GOAL #4   Title pt will tell SLP 3 non-physical deficits (related to cognition) over 3 sessions   Time 1   Period  Weeks   Status On-going          SLP Long Term Goals - 10/26/15 1358    SLP LONG TERM GOAL #1   Title Pt will demo awareness to correct 95% of errors on therapy tasks   Time 4   Period Weeks   Status On-going   SLP LONG TERM GOAL #2   Title pt will demo alternating attention with rare min A cues for redirection   Time 4   Period Weeks   Status On-going   SLP LONG TERM GOAL #3   Title pt will use memory compensations successfully PRN in 4 therapy sessions   Baseline one session 10-05-15   Time 4   Period Weeks   Status On-going   SLP LONG TERM GOAL #4   Title pt will demo anticipatory awareness in order to use compensaations for cognitive deficits in 8 therapy sessions   Time 8   Period Weeks   Status On-going          Plan - 10/26/15 1352    Clinical Impression Statement Deficits in cognitive linguistics cont, and skilled ST cont'd recommended following pt's prescription of new ADD med. SLP explained SLP would like to cont to treat pt if needed, after that time due to unsure how much of pt deficits are due to decr'd attention normally helped with meds. Pt may benefit from further skilled ST after getting on a new med for ADD, to assist with possible return to work and reduction in caregiver burden.   Speech Therapy Frequency 2x / week   Duration --  8 weeks   Treatment/Interventions Cognitive reorganization;SLP instruction and feedback;Compensatory strategies;Internal/external aids;Environmental controls;Patient/family education;Functional tasks;Cueing hierarchy   Potential to Achieve Goals Good   Potential Considerations Ability to learn/carryover information;Severity of impairments;Previous level of function      Patient will benefit from skilled therapeutic intervention in order to improve the following deficits and impairments:   Cognitive communication deficit    Problem List Patient Active Problem List   Diagnosis Date Noted  . E. coli UTI 09/05/2015  .  Anoxic brain injury ([place]) 09/05/2015  . Abnormality of gait   . Acute bilat watershed infarction [facility])   . Notalgia   . Acute systolic congestive heart failure ([place])   . Benign essential HTN   . Acute lower UTI   . AKI (acute kidney injury) ([place])   . History of traumatic brain injury   . Bipolar affective disorder in remission ([place])   . Leukocytosis   . Thrombocytosis ([place])   . Cerebral infarction due to embolism of cerebral artery ([place])   . Anoxic brain damage ([facility])   . Cough   . Essential hypertension   . Systolic dysfunction with acute on chronic heart failure (  [place])   . Hypertrophic obstructive cardiomyopathy ([place]) 08/29/2015  . QT prolongation 08/29/2015  . Hypokalemia 08/29/2015  . Acute respiratory failure ([place])   . Cardiac arrest ([place]) 08/24/2015    [facility] ,[place], [place]  10/26/2015, 2:01 PM  [facility] [address] [place], [place], [address] Phone: [phone]   Fax:  [phone]   Name: Marie Ramos MRN: [id number] Date of Birth: 01-Sep-1955

## 2015-10-26 NOTE — Patient Instructions (Signed)
  Please complete the speech therapy tasks provided to you today.

## 2015-10-26 NOTE — Telephone Encounter (Signed)
°  Follow Up   Pt has a question regarding Adderrall medication that she was recently taken off of. States Psychiatrist was supposed to speak with Dr. Rayann Heman regarding this. Please call.

## 2015-10-29 NOTE — Telephone Encounter (Signed)
Dr Rayann Heman says the MD has not called him.  I spoke to patient's husband and and let him know that if the MD has any questions concerning medications he can call Dr Rayann Heman.  He said he will relay the message to her.

## 2015-10-31 ENCOUNTER — Encounter: Payer: Self-pay | Admitting: Occupational Therapy

## 2015-11-02 ENCOUNTER — Telehealth: Payer: Self-pay | Admitting: Internal Medicine

## 2015-11-02 NOTE — Telephone Encounter (Signed)
°  New Prob   Pt is requesting to a nurse regarding her ADD medication that psychiatrist office prescribed her. Please call.

## 2015-11-02 NOTE — Telephone Encounter (Signed)
Reviewed pt's chart.  Would not recommend Strattera due to risk of SCD.  Other options may be increasing Effexor (which can also increase QTc), bupropion, or clonidine.  Pt aware to hold off on starting Strattera until she discussed further with Dr. Rayann Heman.

## 2015-11-02 NOTE — Telephone Encounter (Addendum)
The medication that Dr Dorethea Clan wants her to take Strattera. Elberta Leatherwood, Pharm D is looking into this as far as prolong QT

## 2015-11-05 ENCOUNTER — Ambulatory Visit (INDEPENDENT_AMBULATORY_CARE_PROVIDER_SITE_OTHER): Payer: BLUE CROSS/BLUE SHIELD | Admitting: Internal Medicine

## 2015-11-05 ENCOUNTER — Encounter: Payer: Self-pay | Admitting: Internal Medicine

## 2015-11-05 VITALS — BP 164/92 | HR 78 | Ht 62.0 in | Wt 243.2 lb

## 2015-11-05 DIAGNOSIS — I4581 Long QT syndrome: Secondary | ICD-10-CM | POA: Diagnosis not present

## 2015-11-05 DIAGNOSIS — I469 Cardiac arrest, cause unspecified: Secondary | ICD-10-CM | POA: Diagnosis not present

## 2015-11-05 DIAGNOSIS — I10 Essential (primary) hypertension: Secondary | ICD-10-CM | POA: Diagnosis not present

## 2015-11-05 DIAGNOSIS — R9431 Abnormal electrocardiogram [ECG] [EKG]: Secondary | ICD-10-CM

## 2015-11-05 MED ORDER — CARVEDILOL 12.5 MG PO TABS: 12.5000 mg | ORAL_TABLET | Freq: Two times a day (BID) | ORAL | Status: DC

## 2015-11-05 NOTE — Patient Instructions (Signed)
Medication Instructions:  Your physician has recommended you make the following change in your medication:  1)Increase Carvedilol to 12.5 mg twice daily   Labwork: None ordered   Testing/Procedures: None ordered   Follow-Up: Your physician recommends that you schedule a follow-up appointment with Dillon Bjork, PA in June    Any Other Special Instructions Will Be Listed Below (If Applicable).     If you need a refill on your cardiac medications before your next appointment, please call your pharmacy.

## 2015-11-05 NOTE — Progress Notes (Signed)
PCP:  Melinda Crutch, MD    Marie Ramos is a 60 y.o. female who presents today for electrophysiology followup.  Her device pocket has completely healed.  No drainage.  Denies warmth/ pain.  No fevers or chills.  Continues to work with psychiatry for bipolar disorder.   She denies SI/HI and actually feels "better" off of medicines.  The patient is otherwise without complaint today.   Past Medical History  Diagnosis Date  . Bipolar 1 disorder ([place])   . Hypertrophic obstructive cardiomyopathy ([place]) 08/29/2015  . Cardiac arrest ([place]) 08/24/2015  . QT prolongation 08/29/2015  . TBI (traumatic brain injury) [facility]) 2011    Inpatient rehab [facility]  . Macular degeneration   . Chronically dry eyes   . Ventricular fibrillation ([place]) 09/03/15    MDT ICD Dr. Rayann Heman   Past Surgical History  Procedure Laterality Date  . Cardiac catheterization N/A 08/24/2015    Procedure: Left Heart Cath and Coronary Angiography;  Surgeon: Sherren Mocha, MD;  Location: [facility];  Service: Cardiovascular;  Laterality: N/A;  . Abdominal hysterectomy    . Tonsillectomy    . Splenectomy, total  2011  . Ep implantable device N/A 09/03/2015    MDT dual chamber ICD    ROS- all systems are reviewed and negative except as per HPI above  Current Outpatient Prescriptions  Medication Sig Dispense Refill  . acetaminophen (TYLENOL) 325 MG tablet Take 1-2 tablets (325-650 mg total) by mouth every 4 (four) hours as needed for mild pain.    Marland Kitchen ALPRAZolam (XANAX) 0.25 MG tablet Take 1 tablet (0.25 mg total) by mouth at bedtime as needed for anxiety or sleep. 30 tablet 0  . amLODipine (NORVASC) 5 MG tablet Take 1 tablet (5 mg total) by mouth daily with breakfast. 30 tablet 0  . apixaban (ELIQUIS) 5 MG TABS tablet Take 1 tablet (5 mg total) by mouth 2 (two) times daily. 60 tablet 0  . atorvastatin (LIPITOR) 20 MG tablet Take 1 tablet (20 mg total) by mouth daily at 6 PM. 30 tablet 0  . carvedilol (COREG) 6.25 MG tablet Take  6.25 mg by mouth 2 (two) times daily.  0  . famotidine (PEPCID) 20 MG tablet Take 20 mg by mouth at bedtime.    . fluconazole (DIFLUCAN) 150 MG tablet Take 1 tablet by mouth as directed.  0  . losartan (COZAAR) 50 MG tablet Take 50 mg by mouth daily.   0  . multivitamin (PROSIGHT) TABS tablet Take 1 tablet by mouth daily. 30 each 0  . Oxcarbazepine (TRILEPTAL) 300 MG tablet Take 1 tablet (300 mg total) by mouth 2 (two) times daily. 60 tablet 0  . traMADol (ULTRAM) 50 MG tablet Take 1 tablet (50 mg total) by mouth every 12 (twelve) hours as needed for moderate pain. 30 tablet 0  . venlafaxine XR (EFFEXOR-XR) 75 MG 24 hr capsule Take 75 mg by mouth daily.  4   No current facility-administered medications for this visit.    Physical Exam: Filed Vitals:   11/05/15 0828  BP: 164/92  Pulse: 78  Height: 5\' 2"  (1.575 m)  Weight: 243 lb 3.2 oz (110.315 kg)  SpO2: 96%    GEN- The patient is well appearing, alert and oriented x 3 today.   Head- normocephalic, atraumatic Eyes-  Sclera clear, conjunctiva pink Ears- hearing intact Oropharynx- clear Lungs- Clear to ausculation bilaterally, normal work of breathing Chest- ICD pocket healed completely.  No fluctuance, redness, warmth/ tenderness Heart-  Regular rate and rhythm, no murmurs, rubs or gallops, PMI not laterally displaced GI- soft, NT, ND, + BS Extremities- no clubbing, cyanosis, or edema  ICD interrogation- reviewed in detail today,  See PACEART report  Assessment and Plan:  1.  S/p VF arrest  ICD incision line has healed nicely Device not checked today No driving (pt aware)  2. HTN Elevated BP today  lifestyle modification encouraged Increase coreg to 12.5mg  BID today (she did not do this when instructed last visit) Increase losartan in the future if needed  3. Prolonged QT Stable No change required today  4. CVA  C/w neurlogy  5. LLE DVT  defer to PMD, on Eliquis  6. Bipolar disorder Avoid QT prolonging  drugs Defer to psych Would avoid xanax long term as able given its addictive properties  Only safe options for her would be wellbutrin or clonidine.  Could consider increasing effexor though this also carries risks of sudden death.  We discussed that as she is doing so well that she should try to avoid any new medicines if possible.  Return for routine 3 month follow-up with EP PA in June I will see in a year  Thompson Grayer MD, [facility] 11/05/2015 8:45 AM

## 2015-11-07 ENCOUNTER — Encounter: Payer: Self-pay | Admitting: Occupational Therapy

## 2015-11-13 ENCOUNTER — Telehealth: Payer: Self-pay | Admitting: *Deleted

## 2015-11-13 ENCOUNTER — Other Ambulatory Visit: Payer: Self-pay | Admitting: Physical Medicine & Rehabilitation

## 2015-11-13 MED ORDER — APIXABAN 5 MG PO TABS: 5.0000 mg | ORAL_TABLET | Freq: Two times a day (BID) | ORAL | Status: DC

## 2015-11-13 NOTE — Telephone Encounter (Signed)
Needs eliquis refill. [facility] closes at 7. She has called cardiologist and they said to call us. She has appointment next week with Posey Pronto. Please refill medication until she can talk with Posey Pronto next week. Please and thank you.

## 2015-11-13 NOTE — Telephone Encounter (Signed)
Is on the medication for DVT, I spoke with the patient and she has follow up in May 25 at 2:40 with Dr Posey Pronto who is following her for DVT and started the medication.  Let her know to speak with them in regards to the Eliquis

## 2015-11-13 NOTE — Telephone Encounter (Signed)
Eliquis sent to pharmacy

## 2015-11-13 NOTE — Telephone Encounter (Signed)
called about eliquis, Dr. Ena Dawley office started medication, wants to know if Dr. Rayann Heman will take this over. please advise.

## 2015-11-14 ENCOUNTER — Encounter: Payer: Self-pay | Admitting: Occupational Therapy

## 2015-11-28 ENCOUNTER — Encounter: Payer: Self-pay | Admitting: Internal Medicine

## 2015-11-29 ENCOUNTER — Encounter
Payer: BLUE CROSS/BLUE SHIELD | Attending: Physical Medicine & Rehabilitation | Admitting: Physical Medicine & Rehabilitation

## 2015-11-29 ENCOUNTER — Encounter: Payer: Self-pay | Admitting: Physical Medicine & Rehabilitation

## 2015-11-29 VITALS — BP 181/100 | HR 84 | Resp 15

## 2015-11-29 DIAGNOSIS — S069X0A Unspecified intracranial injury without loss of consciousness, initial encounter: Secondary | ICD-10-CM | POA: Diagnosis not present

## 2015-11-29 DIAGNOSIS — R05 Cough: Secondary | ICD-10-CM | POA: Diagnosis not present

## 2015-11-29 DIAGNOSIS — I10 Essential (primary) hypertension: Secondary | ICD-10-CM | POA: Diagnosis not present

## 2015-11-29 DIAGNOSIS — R269 Unspecified abnormalities of gait and mobility: Secondary | ICD-10-CM | POA: Diagnosis not present

## 2015-11-29 DIAGNOSIS — G3184 Mild cognitive impairment, so stated: Secondary | ICD-10-CM | POA: Diagnosis not present

## 2015-11-29 DIAGNOSIS — H04129 Dry eye syndrome of unspecified lacrimal gland: Secondary | ICD-10-CM | POA: Diagnosis not present

## 2015-11-29 DIAGNOSIS — R200 Anesthesia of skin: Secondary | ICD-10-CM | POA: Insufficient documentation

## 2015-11-29 DIAGNOSIS — R062 Wheezing: Secondary | ICD-10-CM | POA: Insufficient documentation

## 2015-11-29 DIAGNOSIS — R52 Pain, unspecified: Secondary | ICD-10-CM | POA: Diagnosis not present

## 2015-11-29 DIAGNOSIS — F419 Anxiety disorder, unspecified: Secondary | ICD-10-CM | POA: Insufficient documentation

## 2015-11-29 DIAGNOSIS — I4901 Ventricular fibrillation: Secondary | ICD-10-CM | POA: Diagnosis not present

## 2015-11-29 DIAGNOSIS — H3530 Unspecified macular degeneration: Secondary | ICD-10-CM | POA: Insufficient documentation

## 2015-11-29 DIAGNOSIS — Z8673 Personal history of transient ischemic attack (TIA), and cerebral infarction without residual deficits: Secondary | ICD-10-CM | POA: Insufficient documentation

## 2015-11-29 DIAGNOSIS — R531 Weakness: Secondary | ICD-10-CM | POA: Diagnosis not present

## 2015-11-29 DIAGNOSIS — G931 Anoxic brain damage, not elsewhere classified: Secondary | ICD-10-CM

## 2015-11-29 DIAGNOSIS — I69919 Unspecified symptoms and signs involving cognitive functions following unspecified cerebrovascular disease: Secondary | ICD-10-CM

## 2015-11-29 DIAGNOSIS — R0789 Other chest pain: Secondary | ICD-10-CM | POA: Diagnosis not present

## 2015-11-29 DIAGNOSIS — Z8674 Personal history of sudden cardiac arrest: Secondary | ICD-10-CM | POA: Diagnosis not present

## 2015-11-29 DIAGNOSIS — F319 Bipolar disorder, unspecified: Secondary | ICD-10-CM | POA: Diagnosis not present

## 2015-11-29 MED ORDER — TRAMADOL HCL 50 MG PO TABS: 50.0000 mg | ORAL_TABLET | Freq: Two times a day (BID) | ORAL | Status: DC | PRN

## 2015-11-29 NOTE — Progress Notes (Addendum)
Subjective:    Patient ID: Marie Ramos, female    DOB: August 30, 1955, 60 y.o.   MRN: [id number]  HPI [name] is a 60 year old right- handed female with history of CAD, bipolar disorder who presents for follow up for embolic watershed stroke with mild anoxic brain injury. She had resulting gait abnormalities and congitive deficits. Last clinic visit 09/26/15.   Since last visit she is doing well.  She has an appointment to see Neurology next week.  She recently changed Psychiatrists and was not started back on ADHD meds due to potential complications.  She ntoes her cough has resolved after adjust of timing of Pepcid. Her left forearm wound has healed.  She continues to have pain intermittently along her left chest wall, mainly at night. Overall she is doing well and pleased with her progress.   Pain Inventory Average Pain 0 Pain Right Now 0 My pain is intermittent  In the last 24 hours, has pain interfered with the following? General activity 0 Relation with others 0 Enjoyment of life 0 What TIME of day is your pain at its worst? night Sleep (in general) Fair  Pain is worse with: NA Pain improves with: Medication Relief from Meds: no selection  Mobility how many minutes can you walk? 30 minutes ability to climb steps?  yes do you drive?  no  Function not employed: date last employed 08/24/2015 I need assistance with the following:  NA  Neuro/Psych numbness  Prior Studies x-rays CT/MRI hospital f/u  Physicians involved in your care hospital f/u   Family History  Problem Relation Age of Onset  . Cancer Mother   . COPD Father    Social History   Social History  . Marital Status: Married    Spouse Name: N/A  . Number of Children: N/A  . Years of Education: N/A   Social History Main Topics  . Smoking status: Never Smoker   . Smokeless tobacco: Never Used  . Alcohol Use: No  . Drug Use: No  . Sexual Activity: Not Asked   Other Topics Concern    . None   Social History Narrative   Past Surgical History  Procedure Laterality Date  . Cardiac catheterization N/A 08/24/2015    Procedure: Left Heart Cath and Coronary Angiography;  Surgeon: Sherren Mocha, MD;  Location: [facility];  Service: Cardiovascular;  Laterality: N/A;  . Abdominal hysterectomy    . Tonsillectomy    . Splenectomy, total  2011  . Ep implantable device N/A 09/03/2015    MDT dual chamber ICD   Past Medical History  Diagnosis Date  . Bipolar 1 disorder ([place])   . Hypertrophic obstructive cardiomyopathy ([place]) 08/29/2015  . Cardiac arrest ([place]) 08/24/2015  . QT prolongation 08/29/2015  . TBI (traumatic brain injury) [facility] 2011    Inpatient rehab [facility]  . Macular degeneration   . Chronically dry eyes   . Ventricular fibrillation ([place]) 09/03/15    MDT ICD Dr. Rayann Heman   BP 181/100 mmHg  Pulse 84  Resp 15  SpO2 97%  Opioid Risk Score:   Fall Risk Score:  `1  Depression screen PHQ 2/9  Depression screen PHQ 2/9 09/26/2015  Decreased Interest 0  Down, Depressed, Hopeless 0  PHQ - 2 Score 0  Altered sleeping 3  Tired, decreased energy 1  Change in appetite 0  Feeling bad or failure about yourself  0  Trouble concentrating 2  Moving slowly or fidgety/restless 0  Suicidal thoughts  0  PHQ-9 Score 6  Difficult doing work/chores Not difficult at all   Review of Systems  Musculoskeletal: Negative for myalgias and back pain.       Nocturnal left chest wall pain  Neurological: Positive for numbness. Negative for dizziness, speech difficulty and weakness.  All other systems reviewed and are negative.     Objective:   Physical Exam Constitutional: She appears well-developed and well-nourished. NAD. Vital signs reviewed.  HENT: Normocephalic and atraumatic.  Eyes: Conjunctivae and EOM are normal.  Cardiovascular: Normal rate and regular rhythm.No murmur heard. Respiratory: Effort normal and breath sounds normal.  GI: Soft. Bowel sounds are  normal. She exhibits no distension. There is no tenderness.  Musculoskeletal: She exhibits no edema or tenderness.  Neurological: She is alert and oriented.  Able to follow simple commands.  Motor:  RUE: 5/5 proximal to distal LUE: 5/5 proximal to distal LLE: Hip flexion 5/5, knee extension 5/5, ankle dorsi/plantarflexion 5/5  RLE: Hip flexion 5/5, knee extension 5/5, ankle dorsi/plantarflexion 5/5  Skin: Ulcer left forearm healed. Psychiatric: Anxious behavior.  She has a normal mood and affect.     Assessment & Plan:  [name] is a 60 year old right- handed female with history of CAD, bipolar disorder who presents for follow up for embolic watershed stroke with mild anoxic brain injury.   1.  Emolic watershed stroke with mild anoxic brain injury  Cont meds  Cont therapies  Pt to have follow up with Neurology next week  2. Hypertension.  Cont meds  Pt has appointment with PCP, BP, elevated on this visit, may need to up titrate medications  3.  Bipolar disease  Cont meds  Cont with new Psychiatrist  4. Cough:  Resolved with adjustment in timing of Pepcid  5. Wound left forearm:  Healed  6. Anxiety:  Cont meds per Psych  7. S/p cardiac arrest  Cont tramadol 50mg  qhs and wean for left chest wall pain  Encouraged use of heat/ice  8.  ADHD  Meds stopped by Psych

## 2015-11-29 NOTE — Progress Notes (Deleted)
   Subjective:    Patient ID: Marie Ramos, female    DOB: 06/02/1956, 60 y.o.   MRN: [id number]  HPI    Review of Systems     Objective:   Physical Exam        Assessment & Plan:  Next week Neurology  Changed Psychiatrist   Cough resolved Time change Pepcid helped with cough No tesslon pearls needed   Wound left forearm resolved   Anxiety: by Psych   Left chest wall pain Will refill tramadol 50 qhs     ADHD meds stopped by Psych

## 2015-11-30 ENCOUNTER — Encounter: Payer: Self-pay | Admitting: Internal Medicine

## 2015-12-05 ENCOUNTER — Encounter: Payer: Self-pay | Admitting: Neurology

## 2015-12-05 ENCOUNTER — Ambulatory Visit (INDEPENDENT_AMBULATORY_CARE_PROVIDER_SITE_OTHER): Payer: BLUE CROSS/BLUE SHIELD | Admitting: Neurology

## 2015-12-05 VITALS — BP 110/67 | HR 61 | Ht 62.0 in | Wt 250.0 lb

## 2015-12-05 DIAGNOSIS — E785 Hyperlipidemia, unspecified: Secondary | ICD-10-CM | POA: Insufficient documentation

## 2015-12-05 DIAGNOSIS — I82409 Acute embolism and thrombosis of unspecified deep veins of unspecified lower extremity: Secondary | ICD-10-CM

## 2015-12-05 DIAGNOSIS — I6340 Cerebral infarction due to embolism of unspecified cerebral artery: Secondary | ICD-10-CM

## 2015-12-05 DIAGNOSIS — Z95810 Presence of automatic (implantable) cardiac defibrillator: Secondary | ICD-10-CM | POA: Diagnosis not present

## 2015-12-05 DIAGNOSIS — I469 Cardiac arrest, cause unspecified: Secondary | ICD-10-CM

## 2015-12-05 DIAGNOSIS — I82402 Acute embolism and thrombosis of unspecified deep veins of left lower extremity: Secondary | ICD-10-CM | POA: Diagnosis not present

## 2015-12-05 HISTORY — DX: Cerebral infarction due to embolism of unspecified cerebral artery: I63.40

## 2015-12-05 HISTORY — DX: Acute embolism and thrombosis of unspecified deep veins of unspecified lower extremity: I82.409

## 2015-12-05 HISTORY — DX: Presence of automatic (implantable) cardiac defibrillator: Z95.810

## 2015-12-05 HISTORY — DX: Hyperlipidemia, unspecified: E78.5

## 2015-12-05 NOTE — Patient Instructions (Addendum)
-   continue eliquis for DVT treatment. The duration of eliquis likely to be 6 months. - continue follow up with cardiology for ICD monitoring. If we find condition called afib, you need to be on eliquis life long - continue lipitor for stroke and cardiac prevention.  - Follow up with your primary care physician for stroke risk factor modification. Recommend maintain blood pressure goal <130/80, diabetes with hemoglobin A1c goal below 6.5% and lipids with LDL cholesterol goal below 70 mg/dL.   - check BP at home and record - follow up in 4 months.

## 2015-12-05 NOTE — Progress Notes (Signed)
STROKE NEUROLOGY FOLLOW UP NOTE  NAME: Marie Ramos DOB: 1955/09/02  REASON FOR VISIT: stroke follow up HISTORY FROM: pt and chart  Today we had the pleasure of seeing Marie Ramos in follow-up at our Neurology Clinic. Pt was accompanied by no one.   History Summary Marie Ramos is a 60 y.o. female with history of bipolar disease admitted on 08/24/15 for post VFib arrest with resultant cardioembolic infarcts seen on MRI which showed bilateral scattered supra and infratentorial foci of acute infarcts. The stroke etiology is likely due to combination of hypoperfusion s/p cardiac arrest and later cardiac cath procedure. Arrhythmia such as afib can not be ruled out however. MRA showed mild atherosclerosis, CUS and TCD bubble study negative. EF 40-45% but LE venous doppler found to have LLE DVT. LDL 115 and A1C 6.2. She was put on eliquis and lipitor. She also had ICD placed by cardiology. Pt recovered well and discharged in good condition.  Interval History During the interval time, the patient has been doing well. No recurrent stroke like symptoms. AICD works well and no side effects. She is still on eliquis and lipitor. Followed with cardiology for wound infection after ICD placement and s/p antibiotics. So far wound healing well. She is not driving as instructed by cardiology. BP 110/67 today.   REVIEW OF SYSTEMS: Full 14 system review of systems performed and notable only for those listed below and in HPI above, all others are negative:  Constitutional:   Cardiovascular:  Ear/Nose/Throat:   Skin:  Eyes:   Respiratory:   Gastroitestinal:   Genitourinary:  Hematology/Lymphatic:   Endocrine:  Musculoskeletal:   Allergy/Immunology:  Env allergy Neurological:  numbness Psychiatric:  Sleep:   The following represents the patient's updated allergies and side effects list: Allergies  Allergen Reactions  . Naproxen Itching  . Vicodin [Hydrocodone-Acetaminophen]  Itching    The neurologically relevant items on the patient's problem list were reviewed on today's visit.  Neurologic Examination  A problem focused neurological exam (12 or more points of the single system neurologic examination, vital signs counts as 1 point, cranial nerves count for 8 points) was performed.  Blood pressure 110/67, pulse 61, height 5\' 2"  (1.575 m), weight 250 lb (113.399 kg).  General - morbid obesity, well developed, in no apparent distress.  Ophthalmologic - Fundi not visualized due to small pupils.  Cardiovascular - Regular rate and rhythm  Mental Status -  Level of arousal and orientation to time, place, and person were intact. Language including expression, naming, repetition, comprehension was assessed and found intact. Fund of Knowledge was assessed and was intact.  Cranial Nerves II - XII - II - Visual field intact OU. III, IV, VI - Extraocular movements intact. V - Facial sensation intact bilaterally. VII - Facial movement intact bilaterally. VIII - Hearing & vestibular intact bilaterally. X - Palate elevates symmetrically. XI - Chin turning & shoulder shrug intact bilaterally. XII - Tongue protrusion intact.  Motor Strength - The patient's strength was normal in all extremities and pronator drift was absent.  Bulk was normal and fasciculations were absent.   Motor Tone - Muscle tone was assessed at the neck and appendages and was normal.  Reflexes - The patient's reflexes were 1+ in all extremities and she had no pathological reflexes.  Sensory - Light touch, temperature/pinprick were assessed and were normal.    Coordination - The patient had normal movements in the hands and feet with no ataxia or dysmetria.  Tremor was absent.  Gait and Station - The patient's transfers, posture, gait, station, and turns were observed as normal.   Functional score  mRS = 0   0 - No symptoms.   1 - No significant disability. Able to carry out all  usual activities, despite some symptoms.   2 - Slight disability. Able to look after own affairs without assistance, but unable to carry out all previous activities.   3 - Moderate disability. Requires some help, but able to walk unassisted.   4 - Moderately severe disability. Unable to attend to own bodily needs without assistance, and unable to walk unassisted.   5 - Severe disability. Requires constant nursing care and attention, bedridden, incontinent.   6 - Dead.   NIH Stroke Scale = 0   Data reviewed: I personally reviewed the images and agree with the radiology interpretations.  Dg Chest 2 View 09/01/2015 1. Interval marked improvement in aeration in the left lower lobe since the examination 4 days ago, with only mild residual atelectasis and/or pneumonia. No acute cardiopulmonary disease otherwise. 2. Interval resolution of pulmonary venous hypertension and pulmonary edema.   Mr Marie Ramos Head Wo Contrast 09/02/2015 1. Mild atherosclerotic irregularity without a significant proximal stenosis, aneurysm, or branch vessel occlusion to account for the patient's acute infarcts.   Mr Brain Wo Contrast 09/01/2015 Scattered supra and infratentorial foci of acute ischemia measuring up to 11 mm, which may represent embolic phenomena, possible component of watershed ischemia. Mild chronic small vessel ischemic disease.   Carotid Doppler  There is 1-39% bilateral ICA stenosis. Vertebral artery flow is antegrade.   2D Echocardiogram  - Left ventricle: The cavity size was normal. Systolic function wasmildly to moderately reduced. The estimated ejection fraction wasin the range of 40% to 45%. There is severe asymmetric septalhypetrophy without dynamic obstruction. Findings suggestive ofhypertrophic nonobstructive cardiomyopathy. Diffuse hypokinesis.Doppler parameters are consistent with abnormal left ventricularrelaxation (grade 1 diastolic dysfunction). Septal thickness, ED(2D): 17  mm. Posterior wall thickness, ED (2D): 10.5 mm. - Technically difficult study. Impressions: Technically difficult study, however appears to have asymmetricseptal hypertrophic cardiomyoapathy without obstruction. Insetting of VF arrest, would consider cardiac MRI to betterevaluate as this could be the potential etiology of herarrhythmia.  Cardiac MRI 08/31/2015 findings are suggestive of dilated hypertensive cardiomyopathy rather than hypertrophic cardiomyopathy.  LE venous doppler - DVT noted in the left posterior tibial and left peroneal veins. No DVT RLE.  TCD bubble study - negative for PFO   Assessment: As you may recall, she is a 60 y.o. Caucasian female with PMH of bipolar disease admitted on 08/24/15 for post VFib arrest with resultant cardioembolic infarcts seen on MRI which showed bilateral scattered supra and infratentorial foci of acute infarcts. MRA showed mild atherosclerosis, CUS and TCD bubble study negative. EF 40-45% but LE venous doppler found to have LLE DVT. LDL 115 and A1C 6.2. The stroke etiology is likely due to combination of hypoperfusion s/p cardiac arrest and later cardiac cath procedure. Arrhythmia such as afib can not be ruled out however. She was put on eliquis and lipitor. She also had ICD placed by cardiology. Pt recovered well.  Plan:  - continue eliquis for DVT treatment. The duration of eliquis likely to be 6 months. - continue follow up with cardiology for ICD monitoring. If afib found, pt need to be on eliquis life long - continue lipitor for stroke and cardiac prevention.  - Follow up with your primary care physician for stroke risk factor modification. Recommend maintain  blood pressure goal <130/80, diabetes with hemoglobin A1c goal below 6.5% and lipids with LDL cholesterol goal below 70 mg/dL.   - check BP at home and record - follow up in 4 months.  I spent more than 25 minutes of face to face time with the patient. Greater than 50% of time was spent  in counseling and coordination of care. We discussed ICD monitoring, duration of eliquis, check BP at home and no driving as instructed by cardiology.   No orders of the defined types were placed in this encounter.    No orders of the defined types were placed in this encounter.    Patient Instructions  - continue eliquis for DVT treatment. The duration of eliquis likely to be 6 months. - continue follow up with cardiology for ICD monitoring. If we find condition called afib, you need to be on eliquis life long - continue lipitor for stroke and cardiac prevention.  - Follow up with your primary care physician for stroke risk factor modification. Recommend maintain blood pressure goal <130/80, diabetes with hemoglobin A1c goal below 6.5% and lipids with LDL cholesterol goal below 70 mg/dL.   - check BP at home and record - follow up in 4 months.   Rosalin Hawking, MD PhD [facility] [address] [place], [place] [address] [phone]

## 2015-12-06 ENCOUNTER — Encounter: Payer: BLUE CROSS/BLUE SHIELD | Admitting: Physician Assistant

## 2015-12-14 ENCOUNTER — Telehealth: Payer: Self-pay | Admitting: Internal Medicine

## 2015-12-14 NOTE — Telephone Encounter (Signed)
New Message:  Pt called in stating that she is wanting to travel in the near future which will require her booking the appt here soon. She wanted to know if she could fly on an airplane and if she needed any type of documentation. Please f/u with her.

## 2015-12-14 NOTE — Telephone Encounter (Signed)
Spoke with pt, aware she is fine to fly. Pt was advised to avoid any wanding in the airport. Per the device clinic she can go through the x-ray machine and the ICD card is the only documentation she would need. Pt reports she will discuss further at appt 12-24-15.

## 2015-12-18 ENCOUNTER — Telehealth: Payer: Self-pay

## 2015-12-18 NOTE — Telephone Encounter (Signed)
Pt is traveling via airplane soon. Would like your opinion to be cleared. She is being cautious. She states cardiology has cleared her and advised her to call her other docs.

## 2015-12-19 NOTE — Telephone Encounter (Signed)
She is cleared to travel from a rehab perspective.  Would recommend ankle doris/plantar flexor exercises and frequent ambulation while sedentary for long periods.

## 2015-12-20 NOTE — Telephone Encounter (Signed)
Pt advised.

## 2015-12-23 NOTE — Progress Notes (Signed)
Patient ID: Marie Ramos, female   DOB: 02-20-1956, 60 y.o.   MRN: [id number]    Cardiology Office Note Date:  12/24/2015  Patient ID:  Marie Ramos, Marie Ramos Sep 30, 1955, MRN [id number] PCP:   Melinda Crutch, MD  Cardiologist:  None prior to hospital, (new) Dr. Burt Knack Electrophysiologist: Dr. Rayann Heman    Chief Complaint: routine EP visit  History of Present Illness: Marie Ramos is a 60 y.o. female with history of cardiac arrest 08/24/15, she was at work, AED was immediately applied and recommended and received 2 shocks and CPR, FR found her in PEA with ROSC after epinephrine.    08/24/15: presumd VF arrest with AED advising shocks, CPR, PEA , she had emergent cardiac cath with no CAD, long QT on her EKG noted. Hypokalemic noted as well.   She underwent hypothermia protocol.  Suspected her psych meds were the etiology pf her QT prolongation.  Echo suggested possible HCM,, MRI revealed hypertensive CM, not HCM.  MRI 2/26 noted CVA, neuro on case, possibly related to cath procedure, not recommended to have TEE or further w/u.  She underwent ICD implant  By Dr. Rayann Heman on 09/04/15.  Psychiatry was consulted and aided in her medications, to avoid QT prolonging meds. Discharged from CIR on the current regime. She developed DVT to LLE started on Eliquis.  She was discharged to CIR until 09/14/15 and has undergone out patient rehab since then.  She comes in today being seen for Dr. Rayann Heman, she saw him last month noting complete healing of her wound.  She is feeling quite well, going to the [facility] regularly with her husband exercising and feels like she has good exertional capacity, she has not had any shocks from her device, no CP, palpitations, no SOB, no near syncope or syncope.  She has not had any bleeding or signs of bleeding on the Eliquis.  She continues with her psychiatrist and feels like she is in a good place.    Past Medical History  Diagnosis Date  . Bipolar 1 disorder (Marie Ramos)   . Hypertrophic  obstructive cardiomyopathy (Marie Ramos) 08/29/2015  . Cardiac arrest (Marie Ramos) 08/24/2015  . QT prolongation 08/29/2015  . TBI (traumatic brain injury) [facility]) 2011    Inpatient rehab [facility]  . Macular degeneration   . Chronically dry eyes   . Ventricular fibrillation ([place]) 09/03/15    MDT ICD Dr. Rayann Heman    Past Surgical History  Procedure Laterality Date  . Cardiac catheterization N/A 08/24/2015    Procedure: Left Heart Cath and Coronary Angiography;  Surgeon: Sherren Mocha, MD;  Location: [facility];  Service: Cardiovascular;  Laterality: N/A;  . Abdominal hysterectomy    . Tonsillectomy    . Splenectomy, total  2011  . Ep implantable device N/A 09/03/2015    MDT dual chamber ICD    Current Outpatient Prescriptions  Medication Sig Dispense Refill  . acetaminophen (TYLENOL) 325 MG tablet Take 1-2 tablets (325-650 mg total) by mouth every 4 (four) hours as needed for mild pain.    Marland Kitchen ALPRAZolam (XANAX) 0.25 MG tablet Take 1 tablet (0.25 mg total) by mouth at bedtime as needed for anxiety or sleep. 30 tablet 0  . amLODipine (NORVASC) 5 MG tablet Take 1 tablet (5 mg total) by mouth daily with breakfast. 30 tablet 0  . apixaban (ELIQUIS) 5 MG TABS tablet Take 1 tablet (5 mg total) by mouth 2 (two) times daily. 60 tablet 0  . atorvastatin (LIPITOR) 20 MG tablet Take 1  tablet (20 mg total) by mouth daily at 6 PM. 30 tablet 0  . carvedilol (COREG) 12.5 MG tablet Take 1 tablet (12.5 mg total) by mouth 2 (two) times daily. 180 tablet 3  . famotidine (PEPCID) 20 MG tablet Take 20 mg by mouth at bedtime.    . fluticasone (FLONASE) 50 MCG/ACT nasal spray Place into both nostrils daily.    Marland Kitchen losartan (COZAAR) 50 MG tablet Take 50 mg by mouth daily.   0  . multivitamin (PROSIGHT) TABS tablet Take 1 tablet by mouth daily. 30 each 0  . Oxcarbazepine (TRILEPTAL) 300 MG tablet Take 1 tablet (300 mg total) by mouth 2 (two) times daily. 60 tablet 0  . traMADol (ULTRAM) 50 MG tablet Take 1 tablet (50 mg total) by  mouth every 12 (twelve) hours as needed for moderate pain. 60 tablet 4  . venlafaxine XR (EFFEXOR-XR) 75 MG 24 hr capsule Take 75 mg by mouth daily.  4   No current facility-administered medications for this visit.    Allergies:   Naproxen and Vicodin   Social History:  The patient  reports that she has never smoked. She has never used smokeless tobacco. She reports that she does not drink alcohol or use illicit drugs.   Family History:  The patient's family history includes COPD in her father; Cancer in her mother.  ROS:  Please see the history of present illness.    All other systems are reviewed and otherwise negative.   PHYSICAL EXAM:  VS:  BP 155/92 mmHg  Pulse 81  Ht 5\' 2"  (1.575 m)  Wt 252 lb (114.306 kg)  BMI 46.08 kg/m2 BMI: Body mass index is 46.08 kg/(m^2). Well nourished, well developed, in no acute distress HEENT: normocephalic, atraumatic Neck: no JVD, carotid bruits or masses Cardiac:  normal S1, S2; RRR; no significant murmurs, no rubs, or gallops Lungs:  clear to auscultation bilaterally, no wheezing, rhonchi or rales Abd: soft, nontender MS: no deformity or atrophy Ext: no edema Skin: warm and dry, no rash Neuro:  No gross deficits appreciated Psych: euthymic mood, full affect  ICD site is stable,well healed, no tethering or discomfort   EKG:  09/05/15  SR, LVH ICD interrogation today: battery status is good, no detections or therapies, A. output reprogrammed to clinic standard, lead function is intact   08/24/15: Echocardiogram:  Study Conclusions - Left ventricle: The cavity size was normal. Systolic function was  mildly to moderately reduced. The estimated ejection fraction was  in the range of 40% to 45%. There is severe asymmetric septal  hypetrophy without dynamic obstruction. Findings suggestive of  hypertrophic nonobstructive cardiomyopathy. Diffuse hypokinesis.  Doppler parameters are consistent with abnormal left ventricular  relaxation  (grade 1 diastolic dysfunction). Septal thickness, ED  (2D): 17 mm. Posterior wall thickness, ED (2D): 10.5 mm. - Technically difficult study. Impressions: - Technically difficult study, however appears to have asymmetric  septal hypertrophic cardiomyoapathy without obstruction. In  setting of VF arrest, would consider cardiac MRI to better  evaluate as this could be the potential etiology of her  arrhythmia.  08/24/15: LHC Conclusion    Widely patent coronary arteries Normal LVEDP       Recent Labs: 09/02/2015: TSH 1.952 09/05/2015: Magnesium 2.2 09/06/2015: ALT 30 09/10/2015: BUN 20; Creatinine, Ser 0.91; Hemoglobin 11.1*; Platelets 421*; Potassium 4.6; Sodium 143  09/03/2015: Cholesterol 225*; HDL 43; LDL Cholesterol 115*; Total CHOL/HDL Ratio 5.2; Triglycerides 333*; VLDL 67*   CrCl cannot be calculated (Patient has no serum  creatinine result on file.).   Wt Readings from Last 3 Encounters:  12/24/15 252 lb (114.306 kg)  12/05/15 250 lb (113.399 kg)  11/05/15 243 lb 3.2 oz (110.315 kg)     Other studies reviewed: Additional studies/records reviewed today include: summarized above  DEVICE information:  MDT dual chamber ICD (MRI compatible), implanted 09/03/15, Dr. Rayann Heman  ASSESSMENT AND PLAN:  1. VF arrest, QT prolongation felt secondary to psych meds     MDT ICD, normal function     Patient has been re-instructed and aware, No driving 6 months post cardiac arrest  2. Bipolar     Continue f/u with psych     Avoid QT prolonging medications  3. HTN, hypertensive CM     On BB/ARB tx     exam is compensated, weight is up but no exam evidence of fluid OL     Device notes stable OptiVol and good daily patient activity Her BP is high here today, she notes at her last 2 MD visits was better and when checked at the [facility] is lower as well     4. CVA     C/w as per neurlogy  5. LLE DVT     C/w PMD, on Eliquis   Disposition: q3 month Care[employer] remotes, 38month  in-clinic visit, sooner if needed.  She is asked to continue to keep an eye on her BP, if routinely >140 SBP to notify us  Current medicines are reviewed at length with the patient today.  The patient did not have any concerns regarding medicines.  Haywood Lasso, PA-C 12/24/2015 8:50 AM     [facility] [address] [place] [address] [phone] (office)  [phone] (fax)

## 2015-12-24 ENCOUNTER — Ambulatory Visit (INDEPENDENT_AMBULATORY_CARE_PROVIDER_SITE_OTHER): Payer: BLUE CROSS/BLUE SHIELD | Admitting: Physician Assistant

## 2015-12-24 ENCOUNTER — Encounter: Payer: Self-pay | Admitting: Physician Assistant

## 2015-12-24 VITALS — BP 155/92 | HR 81 | Ht 62.0 in | Wt 252.0 lb

## 2015-12-24 DIAGNOSIS — I10 Essential (primary) hypertension: Secondary | ICD-10-CM | POA: Diagnosis not present

## 2015-12-24 DIAGNOSIS — I469 Cardiac arrest, cause unspecified: Secondary | ICD-10-CM | POA: Diagnosis not present

## 2015-12-24 LAB — CUP PACEART INCLINIC DEVICE CHECK
Battery Voltage: 3.11 V
Brady Statistic AP VS Percent: 0.08 %
Brady Statistic AS VS Percent: 99.88 %
Brady Statistic RA Percent Paced: 0.08 %
Brady Statistic RV Percent Paced: 0.04 %
HighPow Impedance: 74 Ohm
Implantable Lead Implant Date: 20170227
Implantable Lead Implant Date: 20170227
Implantable Lead Location: 753859
Lead Channel Impedance Value: 456 Ohm
Lead Channel Pacing Threshold Pulse Width: 0.4 ms
Lead Channel Pacing Threshold Pulse Width: 0.4 ms
Lead Channel Sensing Intrinsic Amplitude: 20.125 mV
Lead Channel Sensing Intrinsic Amplitude: 22.125 mV
Lead Channel Setting Pacing Amplitude: 2 V
Lead Channel Setting Pacing Pulse Width: 0.4 ms
MDC IDC LEAD LOCATION: 753860
MDC IDC MSMT BATTERY REMAINING LONGEVITY: 132 mo
MDC IDC MSMT LEADCHNL RA PACING THRESHOLD AMPLITUDE: 0.5 V
MDC IDC MSMT LEADCHNL RA SENSING INTR AMPL: 4.125 mV
MDC IDC MSMT LEADCHNL RA SENSING INTR AMPL: 4.625 mV
MDC IDC MSMT LEADCHNL RV IMPEDANCE VALUE: 399 Ohm
MDC IDC MSMT LEADCHNL RV IMPEDANCE VALUE: 475 Ohm
MDC IDC MSMT LEADCHNL RV PACING THRESHOLD AMPLITUDE: 0.625 V
MDC IDC SESS DTM: 20170619125609
MDC IDC SET LEADCHNL RV PACING AMPLITUDE: 2.5 V
MDC IDC SET LEADCHNL RV SENSING SENSITIVITY: 0.3 mV
MDC IDC STAT BRADY AP VP PERCENT: 0 %
MDC IDC STAT BRADY AS VP PERCENT: 0.04 %

## 2015-12-24 NOTE — Patient Instructions (Addendum)
Medication Instructions:   Your physician recommends that you continue on your current medications as directed. Please refer to the Current Medication list given to you today.  If you need a refill on your cardiac medications before your next appointment, please call your pharmacy.  Labwork: NONE ORDER TODAY    Testing/Procedures:  NONE ORDER TODAY   Follow-Up:  Your physician wants you to follow-up in:  IN  6  MONTHS WITH DR  Rayann Heman  You will receive a reminder letter in the mail two months in advance. If you don't receive a letter, please call our office to schedule the follow-up appointment.       Remote monitoring is used to monitor your Pacemaker of ICD from home. This monitoring reduces the number of office visits required to check your device to one time per year. It allows [name] to keep an eye on the functioning of your device to ensure it is working properly. You are scheduled for a device check from home on . 03/24/2016.Marland KitchenMarland KitchenYou may send your transmission at any time that day. If you have a wireless device, the transmission will be sent automatically. After your physician reviews your transmission, you will receive a postcard with your next transmission date.     Any Other Special Instructions Will Be Listed Below (If Applicable).

## 2016-01-09 ENCOUNTER — Other Ambulatory Visit: Payer: Self-pay | Admitting: Physical Medicine and Rehabilitation

## 2016-03-24 ENCOUNTER — Telehealth: Payer: Self-pay | Admitting: Cardiology

## 2016-03-24 ENCOUNTER — Ambulatory Visit (INDEPENDENT_AMBULATORY_CARE_PROVIDER_SITE_OTHER): Payer: BLUE CROSS/BLUE SHIELD | Admitting: *Deleted

## 2016-03-24 DIAGNOSIS — I429 Cardiomyopathy, unspecified: Secondary | ICD-10-CM

## 2016-03-24 NOTE — Telephone Encounter (Signed)
Spoke with pt and reminded pt of remote transmission that is due today. Pt verbalized understanding.   

## 2016-03-25 NOTE — Progress Notes (Signed)
Remote ICD transmission.   

## 2016-03-26 ENCOUNTER — Encounter: Payer: Self-pay | Admitting: Cardiology

## 2016-04-07 LAB — CUP PACEART REMOTE DEVICE CHECK
Battery Remaining Longevity: 130 mo
Brady Statistic AP VP Percent: 0 %
Brady Statistic AP VS Percent: 0.11 %
Brady Statistic AS VS Percent: 99.84 %
HighPow Impedance: 83 Ohm
Implantable Lead Implant Date: 20170227
Implantable Lead Implant Date: 20170227
Implantable Lead Location: 753859
Lead Channel Impedance Value: 475 Ohm
Lead Channel Pacing Threshold Amplitude: 0.625 V
Lead Channel Pacing Threshold Amplitude: 0.75 V
Lead Channel Pacing Threshold Pulse Width: 0.4 ms
Lead Channel Sensing Intrinsic Amplitude: 4 mV
Lead Channel Sensing Intrinsic Amplitude: 4 mV
Lead Channel Setting Pacing Amplitude: 2.5 V
MDC IDC LEAD LOCATION: 753860
MDC IDC MSMT BATTERY VOLTAGE: 3.06 V
MDC IDC MSMT LEADCHNL RA PACING THRESHOLD PULSEWIDTH: 0.4 ms
MDC IDC MSMT LEADCHNL RV IMPEDANCE VALUE: 418 Ohm
MDC IDC MSMT LEADCHNL RV IMPEDANCE VALUE: 532 Ohm
MDC IDC MSMT LEADCHNL RV SENSING INTR AMPL: 20.875 mV
MDC IDC MSMT LEADCHNL RV SENSING INTR AMPL: 20.875 mV
MDC IDC SESS DTM: 20170918234145
MDC IDC SET LEADCHNL RA PACING AMPLITUDE: 2 V
MDC IDC SET LEADCHNL RV PACING PULSEWIDTH: 0.4 ms
MDC IDC SET LEADCHNL RV SENSING SENSITIVITY: 0.3 mV
MDC IDC STAT BRADY AS VP PERCENT: 0.04 %
MDC IDC STAT BRADY RA PERCENT PACED: 0.11 %
MDC IDC STAT BRADY RV PERCENT PACED: 0.05 %

## 2016-04-09 ENCOUNTER — Encounter: Payer: Self-pay | Admitting: Neurology

## 2016-04-09 ENCOUNTER — Telehealth: Payer: Self-pay | Admitting: Internal Medicine

## 2016-04-09 ENCOUNTER — Ambulatory Visit (INDEPENDENT_AMBULATORY_CARE_PROVIDER_SITE_OTHER): Payer: BLUE CROSS/BLUE SHIELD | Admitting: Neurology

## 2016-04-09 VITALS — BP 163/88 | HR 77 | Ht 62.0 in | Wt 261.2 lb

## 2016-04-09 DIAGNOSIS — I10 Essential (primary) hypertension: Secondary | ICD-10-CM

## 2016-04-09 DIAGNOSIS — Z95810 Presence of automatic (implantable) cardiac defibrillator: Secondary | ICD-10-CM

## 2016-04-09 DIAGNOSIS — I469 Cardiac arrest, cause unspecified: Secondary | ICD-10-CM

## 2016-04-09 DIAGNOSIS — I6340 Cerebral infarction due to embolism of unspecified cerebral artery: Secondary | ICD-10-CM

## 2016-04-09 DIAGNOSIS — E782 Mixed hyperlipidemia: Secondary | ICD-10-CM

## 2016-04-09 DIAGNOSIS — I82432 Acute embolism and thrombosis of left popliteal vein: Secondary | ICD-10-CM | POA: Diagnosis not present

## 2016-04-09 DIAGNOSIS — I5023 Acute on chronic systolic (congestive) heart failure: Secondary | ICD-10-CM

## 2016-04-09 NOTE — Progress Notes (Signed)
STROKE NEUROLOGY FOLLOW UP NOTE  NAME: Marie Ramos DOB: 04-04-1956  REASON FOR VISIT: stroke follow up HISTORY FROM: pt and chart  Today we had the pleasure of seeing Marie Ramos in follow-up at our Neurology Clinic. Pt was accompanied by no one.   History Summary Marie Ramos is a 60 y.o. female with history of bipolar disease admitted on 08/24/15 for post VFib arrest with resultant cardioembolic infarcts seen on MRI which showed bilateral scattered supra and infratentorial foci of acute infarcts. The stroke etiology is likely due to combination of hypoperfusion s/p cardiac arrest and later cardiac cath procedure. Arrhythmia such as afib can not be ruled out however. MRA showed mild atherosclerosis, CUS and TCD bubble study negative. EF 40-45% but LE venous doppler found to have LLE DVT. LDL 115 and A1C 6.2. She was put on eliquis and lipitor. She also had ICD placed by cardiology. Pt recovered well and discharged in good condition.  Follow up 12/05/15 - the patient has been doing well. No recurrent stroke like symptoms. AICD works well and no side effects. She is still on eliquis and lipitor. Followed with cardiology for wound infection after ICD placement and s/p antibiotics. So far wound healing well. She is not driving as instructed by cardiology. BP 110/67 today.   Interval History During the interval time, pt has been doing well, no recurrent stroke symptoms. She still on eliquis and has not been discontinued yet. She is going to see PCP soon. 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 She only works 2.5-3 days per week now, she is applying for disability now. BP 163/88, do not check BP at home.   REVIEW OF SYSTEMS: Full 14 system review of systems performed and notable only for those listed below and in HPI above, all others are negative:  Constitutional:   Cardiovascular:  Ear/Nose/Throat:   Skin:  Eyes:     Respiratory:   Gastroitestinal:   Genitourinary:  Hematology/Lymphatic:   Endocrine:  Musculoskeletal:   Allergy/Immunology:  Env allergy Neurological:   Psychiatric:  Sleep:   The following represents the patient's updated allergies and side effects list: Allergies  Allergen Reactions  . Naproxen Itching  . Vicodin [Hydrocodone-Acetaminophen] Itching    The neurologically relevant items on the patient's problem list were reviewed on today's visit.  Neurologic Examination  A problem focused neurological exam (12 or more points of the single system neurologic examination, vital signs counts as 1 point, cranial nerves count for 8 points) was performed.  Blood pressure (!) 163/88, pulse 77, height 5\' 2"  (1.575 m), weight 261 lb 3.2 oz (118.5 kg).  General - morbid obesity, well developed, in no apparent distress.  Ophthalmologic - Fundi not visualized due to small pupils.  Cardiovascular - Regular rate and rhythm  Mental Status -  Level of arousal and orientation to time, place, and person were intact. Language including expression, naming, repetition, comprehension was assessed and found intact. Fund of Knowledge was assessed and was intact.  Cranial Nerves II - XII - II - Visual field intact OU. III, IV, VI - Extraocular movements intact. V - Facial sensation intact bilaterally. VII - Facial movement intact bilaterally. VIII - Hearing & vestibular intact bilaterally. X - Palate elevates symmetrically. XI - Chin turning & shoulder shrug intact bilaterally. XII - Tongue protrusion intact.  Motor Strength - The patient's strength was normal in all extremities and pronator drift was absent.  Bulk was normal and fasciculations were absent.   Motor Tone - Muscle tone was assessed at the neck and appendages and was normal.  Reflexes - The patient's reflexes were 1+ in all extremities and she had no pathological reflexes.  Sensory - Light touch, temperature/pinprick were  assessed and were normal.    Coordination - The patient had normal movements in the hands and feet with no ataxia or dysmetria.  Tremor was absent.  Gait and Station - broad based gait, slow and cautious gait.    Data reviewed: I personally reviewed the images and agree with the radiology interpretations.  Dg Chest 2 View 09/01/2015 1. Interval marked improvement in aeration in the left lower lobe since the examination 4 days ago, with only mild residual atelectasis and/or pneumonia. No acute cardiopulmonary disease otherwise. 2. Interval resolution of pulmonary venous hypertension and pulmonary edema.   Mr Marie Ramos Head Wo Contrast 09/02/2015 1. Mild atherosclerotic irregularity without a significant proximal stenosis, aneurysm, or branch vessel occlusion to account for the patient's acute infarcts.   Mr Brain Wo Contrast 09/01/2015 Scattered supra and infratentorial foci of acute ischemia measuring up to 11 mm, which may represent embolic phenomena, possible component of watershed ischemia. Mild chronic small vessel ischemic disease.   Carotid Doppler  There is 1-39% bilateral ICA stenosis. Vertebral artery flow is antegrade.   2D Echocardiogram  - Left ventricle: The cavity size was normal. Systolic function wasmildly to moderately reduced. The estimated ejection fraction wasin the range of 40% to 45%. There is severe asymmetric septalhypetrophy without dynamic obstruction. Findings suggestive ofhypertrophic nonobstructive cardiomyopathy. Diffuse hypokinesis.Doppler parameters are consistent with abnormal left ventricularrelaxation (grade 1 diastolic dysfunction). Septal thickness, ED(2D): 17 mm. Posterior wall thickness, ED (2D): 10.5 mm. - Technically difficult study. Impressions: Technically difficult study, however appears to have asymmetricseptal hypertrophic cardiomyoapathy without obstruction. Insetting of VF arrest, would consider cardiac MRI to betterevaluate as this  could be the potential etiology of herarrhythmia.  Cardiac MRI 08/31/2015 findings are suggestive of dilated hypertensive cardiomyopathy rather than hypertrophic cardiomyopathy.  LE venous doppler - DVT noted in the left posterior tibial and left peroneal veins. No DVT RLE.  TCD bubble study - negative for PFO   Assessment: As you may recall, she is a 60 y.o. Caucasian female with PMH of bipolar disease admitted on 08/24/15 for post VFib arrest with resultant cardioembolic infarcts seen on MRI which showed bilateral scattered supra and infratentorial foci of acute infarcts. MRA showed mild atherosclerosis, CUS and TCD bubble study negative. EF 40-45% but LE venous doppler found to have LLE DVT. LDL 115 and A1C 6.2. The stroke etiology is likely due to combination of hypoperfusion s/p cardiac arrest and later cardiac cath procedure. Arrhythmia such as afib can not be ruled out however. She was put on eliquis and lipitor. She also had ICD placed by cardiology. Pt recovered well. Still on eliquis, now 8 months, not discontinued yet. Will need to discuss with PCP. ICD showed no afib so far. Complains of post stroke fatigue.   Plan:  - discuss with PCP and cardiologist for duration of eliquis.  - continue follow up with cardiology for ICD monitoring. If afib found, you need to be on eliquis life long - continue lipitor for stroke and cardiac prevention.  - Follow up with your primary care physician for stroke risk factor modification. Recommend maintain blood pressure goal <130/80, diabetes with hemoglobin A1c goal below 6.5% and lipids with LDL cholesterol goal below 70 mg/dL.   -  check BP at home and record - follow up as needed.   No orders of the defined types were placed in this encounter.   Meds ordered this encounter  Medications  . lidocaine (XYLOCAINE) 5 % ointment    Refill:  0  . calcipotriene (DOVONOX) 0.005 % cream    Refill:  0  . lisinopril (PRINIVIL,ZESTRIL) 5 MG tablet     Refill:  1  . fexofenadine-pseudoephedrine (ALLEGRA-D 24) 180-240 MG 24 hr tablet    Sig: Take 1 tablet by mouth daily.    Patient Instructions  - discuss with PCP and cardiologist for duration of eliquis.  - continue follow up with cardiology for ICD monitoring. If afib found, you need to be on eliquis life long - continue lipitor for stroke and cardiac prevention.  - Follow up with your primary care physician for stroke risk factor modification. Recommend maintain blood pressure goal <130/80, diabetes with hemoglobin A1c goal below 6.5% and lipids with LDL cholesterol goal below 70 mg/dL.   - check BP at home and record - follow up as needed.   Rosalin Hawking, MD PhD [facility] [address] [place], [place] [address] [phone]

## 2016-04-09 NOTE — Telephone Encounter (Signed)
New Message  Pt call stating she received a call from the office around 1:00. I did not see a telephone message in the system. Please call back to discuss if needed.

## 2016-04-09 NOTE — Patient Instructions (Addendum)
-   discuss with PCP and cardiologist for duration of eliquis.  - continue follow up with cardiology for ICD monitoring. If afib found, you need to be on eliquis life long - continue lipitor for stroke and cardiac prevention.  - Follow up with your primary care physician for stroke risk factor modification. Recommend maintain blood pressure goal <130/80, diabetes with hemoglobin A1c goal below 6.5% and lipids with LDL cholesterol goal below 70 mg/dL.   - check BP at home and record - follow up as needed.

## 2016-04-09 NOTE — Telephone Encounter (Signed)
Pt is aware that I was not able to fine in her chart if any one called her today. Pt verbalized understanding.

## 2016-04-29 NOTE — Telephone Encounter (Signed)
error 

## 2016-05-19 ENCOUNTER — Other Ambulatory Visit: Payer: Self-pay | Admitting: Physical Medicine and Rehabilitation

## 2016-05-21 ENCOUNTER — Encounter: Payer: Self-pay | Admitting: Physical Medicine & Rehabilitation

## 2016-05-21 ENCOUNTER — Encounter
Payer: BLUE CROSS/BLUE SHIELD | Attending: Physical Medicine & Rehabilitation | Admitting: Physical Medicine & Rehabilitation

## 2016-05-21 VITALS — BP 174/91 | HR 82 | Resp 14

## 2016-05-21 DIAGNOSIS — I69398 Other sequelae of cerebral infarction: Secondary | ICD-10-CM | POA: Insufficient documentation

## 2016-05-21 DIAGNOSIS — F3170 Bipolar disorder, currently in remission, most recent episode unspecified: Secondary | ICD-10-CM

## 2016-05-21 DIAGNOSIS — G931 Anoxic brain damage, not elsewhere classified: Secondary | ICD-10-CM | POA: Diagnosis not present

## 2016-05-21 DIAGNOSIS — I69919 Unspecified symptoms and signs involving cognitive functions following unspecified cerebrovascular disease: Secondary | ICD-10-CM | POA: Diagnosis not present

## 2016-05-21 DIAGNOSIS — I2510 Atherosclerotic heart disease of native coronary artery without angina pectoris: Secondary | ICD-10-CM | POA: Insufficient documentation

## 2016-05-21 DIAGNOSIS — I421 Obstructive hypertrophic cardiomyopathy: Secondary | ICD-10-CM | POA: Insufficient documentation

## 2016-05-21 DIAGNOSIS — F909 Attention-deficit hyperactivity disorder, unspecified type: Secondary | ICD-10-CM | POA: Insufficient documentation

## 2016-05-21 DIAGNOSIS — F319 Bipolar disorder, unspecified: Secondary | ICD-10-CM | POA: Insufficient documentation

## 2016-05-21 DIAGNOSIS — H3530 Unspecified macular degeneration: Secondary | ICD-10-CM | POA: Diagnosis not present

## 2016-05-21 DIAGNOSIS — I10 Essential (primary) hypertension: Secondary | ICD-10-CM | POA: Insufficient documentation

## 2016-05-21 DIAGNOSIS — I69314 Frontal lobe and executive function deficit following cerebral infarction: Secondary | ICD-10-CM | POA: Insufficient documentation

## 2016-05-21 DIAGNOSIS — R269 Unspecified abnormalities of gait and mobility: Secondary | ICD-10-CM | POA: Diagnosis not present

## 2016-05-21 DIAGNOSIS — Z5189 Encounter for other specified aftercare: Secondary | ICD-10-CM | POA: Insufficient documentation

## 2016-05-21 DIAGNOSIS — R5383 Other fatigue: Secondary | ICD-10-CM

## 2016-05-21 DIAGNOSIS — Z8674 Personal history of sudden cardiac arrest: Secondary | ICD-10-CM | POA: Diagnosis not present

## 2016-05-21 DIAGNOSIS — F419 Anxiety disorder, unspecified: Secondary | ICD-10-CM | POA: Diagnosis not present

## 2016-05-21 DIAGNOSIS — I629 Nontraumatic intracranial hemorrhage, unspecified: Secondary | ICD-10-CM | POA: Diagnosis present

## 2016-05-21 DIAGNOSIS — M62838 Other muscle spasm: Secondary | ICD-10-CM

## 2016-05-21 DIAGNOSIS — F411 Generalized anxiety disorder: Secondary | ICD-10-CM

## 2016-05-21 MED ORDER — METHOCARBAMOL 500 MG PO TABS
500.0000 mg | ORAL_TABLET | Freq: Every day | ORAL | 1 refills | Status: DC | PRN
Start: 2016-05-21 — End: 2017-01-09

## 2016-05-21 NOTE — Progress Notes (Signed)
Subjective:    Patient ID: Marie Ramos, female    DOB: [birth year], 60 y.o.   MRN: [id number]  HPI  [name] is a 60 year old right- handed female with history of CAD, bipolar disorder who presents for follow up for embolic watershed stroke with mild anoxic brain injury. She had resulting gait abnormalities and congitive deficits.   Last clinic visit 11/29/15. Since last visit she is doing well.  She travelled recently and is doing well.  She saw Neurology and was released.  Her ADHD meds were d/ced by Psych and is doing okay without it.  Overall, she is doing well from a stroke perspective.  Her BP is elevated, but she states it is much better at home.  Pt states she sometimes gets back pain at night as she tries to do more.  Overall, she is doing well.    Pain Inventory Average Pain 5 Pain Right Now 2 My pain is intermittent and aching  In the last 24 hours, has pain interfered with the following? General activity 0 Relation with others 0 Enjoyment of life 1 What TIME of day is your pain at its worst? evening Sleep (in general) Fair  Pain is worse with: some activites Pain improves with: rest and medication Relief from Meds: 9  Mobility how many minutes can you walk? 15-20 ability to climb steps?  yes do you drive?  yes Do you have any goals in this area?  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  no  Physicians involved in your care Any changes since last visit?  no   Family History  Problem Relation Age of Onset  . Cancer Mother   . COPD Father    Social History   Social History  . Marital status: Married    Spouse name: N/A  . Number of children: N/A  . Years of education: N/A   Social History Main Topics  . Smoking status: Never Smoker  . Smokeless tobacco: Never Used  . Alcohol use No  . Drug use: No  . Sexual activity:  Not Asked   Other Topics Concern  . None   Social History Narrative  . None   Past Surgical History:  Procedure Laterality Date  . ABDOMINAL HYSTERECTOMY    . CARDIAC CATHETERIZATION N/A 08/24/2015   Procedure: Left Heart Cath and Coronary Angiography;  Surgeon: Sherren Mocha, MD;  Location: [facility];  Service: Cardiovascular;  Laterality: N/A;  . EP IMPLANTABLE DEVICE N/A 09/03/2015   MDT dual chamber ICD  . SPLENECTOMY, TOTAL  2011  . TONSILLECTOMY     Past Medical History:  Diagnosis Date  . Bipolar 1 disorder ([place])   . Cardiac arrest ([place]) 08/24/2015  . Chronically dry eyes   . Hypertrophic obstructive cardiomyopathy ([place]) 08/29/2015  . Macular degeneration   . QT prolongation 08/29/2015  . Stroke ([place])   . TBI (traumatic brain injury) [facility]) 2011   Inpatient rehab [facility]  . Ventricular fibrillation ([place]) 09/03/15   MDT ICD Dr. Rayann Heman   BP (!) 174/91 (BP Location: Right Arm, Patient Position: Sitting, Cuff Size: Large)   Pulse 82   Resp 14   SpO2 94%   Opioid Risk Score:   Fall Risk Score:  `1  Depression screen PHQ 2/9  Depression screen PHQ 2/9 09/26/2015  Decreased Interest 0  Down, Depressed, Hopeless 0  PHQ - 2 Score 0  Altered sleeping 3  Tired, decreased energy 1  Change in appetite 0  Feeling bad or failure about yourself  0  Trouble concentrating 2  Moving slowly or fidgety/restless 0  Suicidal thoughts 0  PHQ-9 Score 6  Difficult doing work/chores Not difficult at all   Review of Systems  Constitutional: Negative.   HENT: Negative.   Eyes: Negative.   Respiratory: Positive for shortness of breath.   Cardiovascular: Negative.   Gastrointestinal: Negative.   Endocrine: Negative.   Genitourinary: Negative.   Musculoskeletal: Positive for arthralgias, back pain and myalgias.  Skin: Negative.   Allergic/Immunologic: Negative.   Neurological: Negative for dizziness, speech difficulty and weakness.  Hematological: Bruises/bleeds easily.    All other systems reviewed and are negative.     Objective:   Physical Exam  Constitutional: She appears well-developed and well-nourished. NAD. Vital signs reviewed.  HENT: Normocephalic and atraumatic.  Eyes: EOM are normal. No discharge. Cardiovascular: RRR. No JVD. Respiratory: Effort normal and breath sounds normal.  GI: Soft. Bowel sounds are normal.   Musculoskeletal: She exhibits no edema or tenderness.  Neurological: She is alert and oriented.  Able to follow simple commands.  Motor:  RUE: 5/5 proximal to distal LUE: 5/5 proximal to distal LLE: Hip flexion 5/5, knee extension 5/5, ankle dorsi/plantarflexion 5/5  RLE: Hip flexion 5/5, knee extension 5/5, ankle dorsi/plantarflexion 5/5  Skin: Warm and dry. Psychiatric: Verbose, otherwise, relatively normal mood and affect.     Assessment & Plan:  [name] is a 60 year old right- handed female with history of CAD, bipolar disorder who presents for follow up for embolic watershed stroke with mild anoxic brain injury.   1.  Late effects emolic watershed stroke with mild anoxic brain injury  Cont meds  Completed, cont HEP  Cont exercise, with encouragement of aquatic therapy at [facility]  Discharged from Neurology   2. Hypertension.  Cont meds  Remains, elevated, however, pt states normally well controlled.  Pt to follow up with PCP.  3.  Bipolar disease  Cont meds  Cont Psychiatrist  4. Anxiety:  Cont meds per Psych  5.  ADHD  Meds stopped by Psych  6. Post stroke fatigue  Graduated activity  Cont [employer]  7. Muscle spasms  Will order Robaxin 500mg  BID PRN

## 2016-05-23 ENCOUNTER — Ambulatory Visit: Payer: BLUE CROSS/BLUE SHIELD | Admitting: Physical Medicine & Rehabilitation

## 2016-06-18 ENCOUNTER — Encounter: Payer: Self-pay | Admitting: Internal Medicine

## 2016-06-23 ENCOUNTER — Ambulatory Visit (INDEPENDENT_AMBULATORY_CARE_PROVIDER_SITE_OTHER): Payer: BLUE CROSS/BLUE SHIELD | Admitting: Internal Medicine

## 2016-06-23 ENCOUNTER — Encounter: Payer: Self-pay | Admitting: Internal Medicine

## 2016-06-23 VITALS — BP 142/88 | HR 71 | Ht 62.0 in | Wt 266.0 lb

## 2016-06-23 DIAGNOSIS — I469 Cardiac arrest, cause unspecified: Secondary | ICD-10-CM | POA: Diagnosis not present

## 2016-06-23 DIAGNOSIS — I5021 Acute systolic (congestive) heart failure: Secondary | ICD-10-CM | POA: Diagnosis not present

## 2016-06-23 DIAGNOSIS — I10 Essential (primary) hypertension: Secondary | ICD-10-CM

## 2016-06-23 DIAGNOSIS — R9431 Abnormal electrocardiogram [ECG] [EKG]: Secondary | ICD-10-CM

## 2016-06-23 DIAGNOSIS — Z95810 Presence of automatic (implantable) cardiac defibrillator: Secondary | ICD-10-CM

## 2016-06-23 LAB — CUP PACEART INCLINIC DEVICE CHECK
Brady Statistic AP VP Percent: 0 %
Brady Statistic AS VP Percent: 0.04 %
Brady Statistic RA Percent Paced: 0.1 %
Brady Statistic RV Percent Paced: 0.05 %
Date Time Interrogation Session: 20171218101707
HIGH POWER IMPEDANCE MEASURED VALUE: 81 Ohm
Implantable Lead Implant Date: 20170227
Implantable Lead Location: 753859
Implantable Lead Model: 5076
Lead Channel Impedance Value: 399 Ohm
Lead Channel Impedance Value: 513 Ohm
Lead Channel Pacing Threshold Pulse Width: 0.4 ms
Lead Channel Pacing Threshold Pulse Width: 0.4 ms
Lead Channel Sensing Intrinsic Amplitude: 2.75 mV
Lead Channel Sensing Intrinsic Amplitude: 23.375 mV
Lead Channel Setting Pacing Amplitude: 2.5 V
Lead Channel Setting Sensing Sensitivity: 0.3 mV
MDC IDC LEAD IMPLANT DT: 20170227
MDC IDC LEAD LOCATION: 753860
MDC IDC MSMT BATTERY REMAINING LONGEVITY: 128 mo
MDC IDC MSMT BATTERY VOLTAGE: 3.03 V
MDC IDC MSMT LEADCHNL RA IMPEDANCE VALUE: 418 Ohm
MDC IDC MSMT LEADCHNL RA PACING THRESHOLD AMPLITUDE: 0.75 V
MDC IDC MSMT LEADCHNL RV PACING THRESHOLD AMPLITUDE: 0.5 V
MDC IDC PG IMPLANT DT: 20170227
MDC IDC SET LEADCHNL RA PACING AMPLITUDE: 2 V
MDC IDC SET LEADCHNL RV PACING PULSEWIDTH: 0.4 ms
MDC IDC STAT BRADY AP VS PERCENT: 0.1 %
MDC IDC STAT BRADY AS VS PERCENT: 99.86 %

## 2016-06-23 LAB — MAGNESIUM: MAGNESIUM: 2.1 mg/dL (ref 1.5–2.5)

## 2016-06-23 LAB — BRAIN NATRIURETIC PEPTIDE: BRAIN NATRIURETIC PEPTIDE: 24.3 pg/mL (ref <100)

## 2016-06-23 LAB — BASIC METABOLIC PANEL
BUN: 29 mg/dL — AB (ref 7–25)
CO2: 31 mmol/L (ref 20–31)
CREATININE: 1.02 mg/dL — AB (ref 0.50–0.99)
Calcium: 9.7 mg/dL (ref 8.6–10.4)
Chloride: 102 mmol/L (ref 98–110)
GLUCOSE: 69 mg/dL (ref 65–99)
Potassium: 4.8 mmol/L (ref 3.5–5.3)
Sodium: 139 mmol/L (ref 135–146)

## 2016-06-23 MED ORDER — LOSARTAN POTASSIUM 100 MG PO TABS: 100.0000 mg | ORAL_TABLET | Freq: Every day | ORAL | 3 refills | Status: DC

## 2016-06-23 MED ORDER — FUROSEMIDE 20 MG PO TABS: 20.0000 mg | ORAL_TABLET | Freq: Every day | ORAL | 3 refills | Status: DC

## 2016-06-23 NOTE — Progress Notes (Signed)
PCP: Melinda Crutch, MD    Marie Ramos is a 60 y.o. female who presents today for electrophysiology followup.  She has gained significant weight.  She has SOB with moderate activity as well as with walking up an incline.  She has occasional leg swelling.   Continues to work with psychiatry for bipolar disorder.   She denies SI/HI and actually feels "better" off of medicines.  The patient is otherwise without complaint today.   Past Medical History:  Diagnosis Date  . Bipolar 1 disorder ([place])   . Cardiac arrest ([place]) 08/24/2015  . Chronically dry eyes   . Hypertrophic obstructive cardiomyopathy ([place]) 08/29/2015  . Macular degeneration   . QT prolongation 08/29/2015  . Stroke ([place])   . TBI (traumatic brain injury) [facility]) 2011   Inpatient rehab [facility]  . Ventricular fibrillation ([place]) 09/03/15   MDT ICD Dr. Rayann Heman   Past Surgical History:  Procedure Laterality Date  . ABDOMINAL HYSTERECTOMY    . CARDIAC CATHETERIZATION N/A 08/24/2015   Procedure: Left Heart Cath and Coronary Angiography;  Surgeon: Sherren Mocha, MD;  Location: [facility];  Service: Cardiovascular;  Laterality: N/A;  . EP IMPLANTABLE DEVICE N/A 09/03/2015   MDT dual chamber ICD  . SPLENECTOMY, TOTAL  2011  . TONSILLECTOMY      ROS- all systems are reviewed and negative except as per HPI above  Current Outpatient Prescriptions  Medication Sig Dispense Refill  . acetaminophen (TYLENOL) 325 MG tablet Take 1-2 tablets (325-650 mg total) by mouth every 4 (four) hours as needed for mild pain.    Marland Kitchen ALPRAZolam (XANAX) 0.25 MG tablet Take 1 tablet (0.25 mg total) by mouth at bedtime as needed for anxiety or sleep. 30 tablet 0  . amLODipine (NORVASC) 5 MG tablet Take 1 tablet (5 mg total) by mouth daily with breakfast. 30 tablet 0  . apixaban (ELIQUIS) 5 MG TABS tablet Take 1 tablet (5 mg total) by mouth 2 (two) times daily. 60 tablet 0  . atorvastatin (LIPITOR) 20 MG tablet Take 1 tablet (20 mg total) by mouth daily at  6 PM. 30 tablet 0  . calcipotriene (DOVONOX) 0.005 % cream as directed.   0  . carvedilol (COREG) 12.5 MG tablet Take 1 tablet (12.5 mg total) by mouth 2 (two) times daily. 180 tablet 3  . famotidine (PEPCID) 20 MG tablet Take 20 mg by mouth at bedtime.    . fexofenadine (ALLEGRA) 180 MG tablet Take 180 mg by mouth daily.    . fluticasone (FLONASE) 50 MCG/ACT nasal spray Place into both nostrils daily. Use as directed    . lidocaine (XYLOCAINE) 5 % ointment as directed.   0  . losartan (COZAAR) 50 MG tablet Take 50 mg by mouth daily.   0  . methocarbamol (ROBAXIN) 500 MG tablet Take 1 tablet (500 mg total) by mouth daily as needed for muscle spasms. 30 tablet 1  . multivitamin (PROSIGHT) TABS tablet Take 1 tablet by mouth daily. 30 each 0  . Oxcarbazepine (TRILEPTAL) 300 MG tablet Take 1 tablet (300 mg total) by mouth 2 (two) times daily. 60 tablet 0  . traMADol (ULTRAM) 50 MG tablet Take 1 tablet (50 mg total) by mouth every 12 (twelve) hours as needed for moderate pain. 60 tablet 4  . venlafaxine XR (EFFEXOR-XR) 75 MG 24 hr capsule Take 75 mg by mouth daily.  4   No current facility-administered medications for this visit.     Physical Exam: Vitals:  06/23/16 0926  BP: (!) 142/88  Pulse: 71  Weight: 266 lb (120.7 kg)  Height: 5\' 2"  (1.575 m)    GEN- The patient is well appearing, alert and oriented x 3 today.   Head- normocephalic, atraumatic Eyes-  Sclera clear, conjunctiva pink Ears- hearing intact Oropharynx- clear Lungs-few basilar rales, normal work of breathing Chest- ICD pocket healed completely.  No fluctuance, redness, warmth/ tenderness Heart- Regular rate and rhythm, no murmurs, rubs or gallops, PMI not laterally displaced GI- soft, NT, ND, + BS Extremities- no clubbing, cyanosis, or edema  ICD interrogation- reviewed in detail today,  See PACEART report  Assessment and Plan:  1.  S/p VF arrest Normal ICD function See Pace Art report No changes today Avoid  QT prolonging medicines and caution with antipsychotics in the futre Bmet, mg today  2. HTN Elevated BP today  lifestyle modification encouraged She is on lisinopril and losartan for reasons not clear to me I have therefore stopped lisinopril today.  Increase losartan to 100mg  daily bmet today  3. Prolonged QT Stable No change required today Bmet, mg today  4. CVA  f/w neurlogy  5. LLE DVT  defer to PMD, on Eliquis  6. Bipolar disorder Avoid QT prolonging drugs Defer to psych  7. Chronic systolic dysfunction Mild volume overload on exam Will add lasix 20mg  daily Bmet, BNP today Repeat echo Enroll in ICM device clinic  8. Obesity  Body mass index is 48.65 kg/m. Weight loss advised  Return for 4 month follow-up with EP PA  I will see in a year carelink  Thompson Grayer MD, [facility] 06/23/2016 9:47 AM

## 2016-06-23 NOTE — Patient Instructions (Addendum)
Medication Instructions:  Your physician has recommended you make the following change in your medication:  1) Stop Lisinopril 2) Increase Losartan to 100 mg daily 3) Start Furosemide 20 mg daily   Labwork: Your physician recommends that you return for lab work today: BNP/BMP/Mag   Testing/Procedures: Your physician has requested that you have an echocardiogram. Echocardiography is a painless test that uses sound waves to create images of your heart. It provides your doctor with information about the size and shape of your heart and how well your heart's chambers and valves are working. This procedure takes approximately one hour. There are no restrictions for this procedure.    Follow-Up: Your physician wants you to follow-up in: 4 months with Tommye Standard, PA and 12 months with Dr Rayann Heman Dennis Bast will receive a reminder letter in the mail two months in advance. If you don't receive a letter, please call our office to schedule the follow-up appointment.   Remote monitoring is used to monitor your  ICD from home. This monitoring reduces the number of office visits required to check your device to one time per year. It allows [name] to keep an eye on the functioning of your device to ensure it is working properly. You are scheduled for a device check from home on 09/22/16. You may send your transmission at any time that day. If you have a wireless device, the transmission will be sent automatically. After your physician reviews your transmission, you will receive a postcard with your next transmission date.      Low-Sodium Eating Plan Sodium raises blood pressure and causes water to be held in the body. Getting less sodium from food will help lower your blood pressure, reduce any swelling, and protect your heart, liver, and kidneys. We get sodium by adding salt (sodium chloride) to food. Most of our sodium comes from canned, boxed, and frozen foods. Restaurant foods, fast foods, and pizza are also  very high in sodium. Even if you take medicine to lower your blood pressure or to reduce fluid in your body, getting less sodium from your food is important. What is my plan? Most people should limit their sodium intake to 2,300 mg a day. Your health care provider recommends that you limit your sodium intake to 2,000 mg a day. What do I need to know about this eating plan? For the low-sodium eating plan, you will follow these general guidelines:  Choose foods with a % Daily Value for sodium of less than 5% (as listed on the food label).  Use salt-free seasonings or herbs instead of table salt or sea salt.  Check with your health care provider or pharmacist before using salt substitutes.  Eat fresh foods.  Eat more vegetables and fruits.  Limit canned vegetables. If you do use them, rinse them well to decrease the sodium.  Limit cheese to 1 oz (28 g) per day.  Eat lower-sodium products, often labeled as "lower sodium" or "no salt added."  Avoid foods that contain monosodium glutamate (MSG). MSG is sometimes added to [place] food and some canned foods.  Check food labels (Nutrition Facts labels) on foods to learn how much sodium is in one serving.  Eat more home-cooked food and less restaurant, buffet, and fast food.  When eating at a restaurant, ask that your food be prepared with less salt, or no salt if possible. How do I read food labels for sodium information? The Nutrition Facts label lists the amount of sodium in one serving of  the food. If you eat more than one serving, you must multiply the listed amount of sodium by the number of servings. Food labels may also identify foods as:  Sodium free-Less than 5 mg in a serving.  Very low sodium-35 mg or less in a serving.  Low sodium-140 mg or less in a serving.  Light in sodium-50% less sodium in a serving. For example, if a food that usually has 300 mg of sodium is changed to become light in sodium, it will have 150 mg of  sodium.  Reduced sodium-25% less sodium in a serving. For example, if a food that usually has 400 mg of sodium is changed to reduced sodium, it will have 300 mg of sodium. What foods can I eat? Grains  Low-sodium cereals, including oats, puffed wheat and rice, and shredded wheat cereals. Low-sodium crackers. Unsalted rice and pasta. Lower-sodium bread. Vegetables  Frozen or fresh vegetables. Low-sodium or reduced-sodium canned vegetables. Low-sodium or reduced-sodium tomato sauce and paste. Low-sodium or reduced-sodium tomato and vegetable juices. Fruits  Fresh, frozen, and canned fruit. Fruit juice. Meat and Other Protein Products  Low-sodium canned tuna and salmon. Fresh or frozen meat, poultry, seafood, and fish. Lamb. Unsalted nuts. Dried beans, peas, and lentils without added salt. Unsalted canned beans. Homemade soups without salt. Eggs. Dairy  Milk. Soy milk. Ricotta cheese. Low-sodium or reduced-sodium cheeses. Yogurt. Condiments  Fresh and dried herbs and spices. Salt-free seasonings. Onion and garlic powders. Low-sodium varieties of mustard and ketchup. Fresh or refrigerated horseradish. Lemon juice. Fats and Oils  Reduced-sodium salad dressings. Unsalted butter. Other  Unsalted popcorn and pretzels. The items listed above may not be a complete list of recommended foods or beverages. Contact your dietitian for more options.  What foods are not recommended? Grains  Instant hot cereals. Bread stuffing, pancake, and biscuit mixes. Croutons. Seasoned rice or pasta mixes. Noodle soup cups. Boxed or frozen macaroni and cheese. Self-rising flour. Regular salted crackers. Vegetables  Regular canned vegetables. Regular canned tomato sauce and paste. Regular tomato and vegetable juices. Frozen vegetables in sauces. Salted [place] fries. Olives. Angie Fava. Relishes. Sauerkraut. Salsa. Meat and Other Protein Products  Salted, canned, smoked, spiced, or pickled meats, seafood, or fish. Bacon,  ham, sausage, hot dogs, corned beef, chipped beef, and packaged luncheon meats. Salt pork. Jerky. Pickled herring. Anchovies, regular canned tuna, and sardines. Salted nuts. Dairy  Processed cheese and cheese spreads. Cheese curds. Blue cheese and cottage cheese. Buttermilk. Condiments  Onion and garlic salt, seasoned salt, table salt, and sea salt. Canned and packaged gravies. Worcestershire sauce. Tartar sauce. Barbecue sauce. Teriyaki sauce. Soy sauce, including reduced sodium. Steak sauce. Fish sauce. Oyster sauce. Cocktail sauce. Horseradish that you find on the shelf. Regular ketchup and mustard. Meat flavorings and tenderizers. Bouillon cubes. Hot sauce. Tabasco sauce. Marinades. Taco seasonings. Relishes. Fats and Oils  Regular salad dressings. Salted butter. Margarine. Ghee. Bacon fat. Other  Potato and tortilla chips. Corn chips and puffs. Salted popcorn and pretzels. Canned or dried soups. Pizza. Frozen entrees and pot pies. The items listed above may not be a complete list of foods and beverages to avoid. Contact your dietitian for more information.  This information is not intended to replace advice given to you by your health care provider. Make sure you discuss any questions you have with your health care provider. Document Released: 12/13/2001 Document Revised: 11/29/2015 Document Reviewed: 04/27/2013 Elsevier Interactive Patient Education  2017 [employer].

## 2016-07-01 ENCOUNTER — Telehealth: Payer: Self-pay

## 2016-07-01 NOTE — Telephone Encounter (Signed)
Referred to ICM clinic by Dr Rayann Heman.  ICM intro given and explained program.  She agreed to [facility] monthly follow up and 1st ICM remote transmission scheduled for 07/17/2016.  Unable to provide ICM direct number but will provide at next call.

## 2016-07-14 ENCOUNTER — Ambulatory Visit (HOSPITAL_COMMUNITY): Payer: BLUE CROSS/BLUE SHIELD | Attending: Cardiology

## 2016-07-14 ENCOUNTER — Other Ambulatory Visit: Payer: Self-pay

## 2016-07-14 DIAGNOSIS — I110 Hypertensive heart disease with heart failure: Secondary | ICD-10-CM | POA: Diagnosis not present

## 2016-07-14 DIAGNOSIS — E785 Hyperlipidemia, unspecified: Secondary | ICD-10-CM | POA: Diagnosis not present

## 2016-07-14 DIAGNOSIS — I422 Other hypertrophic cardiomyopathy: Secondary | ICD-10-CM | POA: Insufficient documentation

## 2016-07-14 DIAGNOSIS — I5021 Acute systolic (congestive) heart failure: Secondary | ICD-10-CM | POA: Diagnosis not present

## 2016-07-14 DIAGNOSIS — I509 Heart failure, unspecified: Secondary | ICD-10-CM | POA: Diagnosis present

## 2016-07-17 ENCOUNTER — Telehealth: Payer: Self-pay | Admitting: Internal Medicine

## 2016-07-17 ENCOUNTER — Ambulatory Visit (INDEPENDENT_AMBULATORY_CARE_PROVIDER_SITE_OTHER): Payer: BLUE CROSS/BLUE SHIELD

## 2016-07-17 DIAGNOSIS — Z95810 Presence of automatic (implantable) cardiac defibrillator: Secondary | ICD-10-CM | POA: Diagnosis not present

## 2016-07-17 DIAGNOSIS — I5023 Acute on chronic systolic (congestive) heart failure: Secondary | ICD-10-CM

## 2016-07-17 DIAGNOSIS — I5021 Acute systolic (congestive) heart failure: Secondary | ICD-10-CM

## 2016-07-17 NOTE — Telephone Encounter (Signed)
Follow Up:    Pt would like her echo results from Monday please.

## 2016-07-17 NOTE — Telephone Encounter (Signed)
Patient aware.

## 2016-07-17 NOTE — Progress Notes (Signed)
EPIC Encounter for ICM Monitoring  Patient Name: Marie Ramos is a 61 y.o. female Date: 07/17/2016 Primary Care Physican: Melinda Crutch, MD Primary Cardiologist: Allred Electrophysiologist: Allred Dry Weight:  unknown       1st ICM encounter.  Heart Failure questions reviewed, pt asymptomatic.  Patient is waiting on results of Echocardiogram.   Thoracic impedance slightly abnormal suggesting fluid accumulation.  She stated she has a cold.   Recommendations:  No changes.  Discussed limitation for salt and fluid intake. Reinforced to limit low salt food choices to 2000 mg day and limiting fluid intake to < 2 liters per day. She stated she had been eating frozen dinners but realized how much sodium they contained and stopped eating them.  She does eat out some.  Encouraged to call for fluid symptoms and provided direct ICM number.    Follow-up plan: ICM clinic phone appointment on 08/19/2016.  Copy of ICM check sent to device physician.   3 month ICM trend : 07/17/2016   1 Year ICM trend:      [name], RN 07/17/2016 2:06 PM

## 2016-07-19 ENCOUNTER — Encounter: Payer: Self-pay | Admitting: Internal Medicine

## 2016-07-21 ENCOUNTER — Encounter: Payer: Self-pay | Admitting: Neurology

## 2016-07-21 ENCOUNTER — Telehealth: Payer: Self-pay | Admitting: Neurology

## 2016-07-21 NOTE — Telephone Encounter (Signed)
Pt fu from email message regarding Eliquis, the cost is too much and wants to know if anything cheaper-pls call [phone]

## 2016-07-21 NOTE — Telephone Encounter (Signed)
Pt called said she worried about being off the eliquis for 2 days. Pt expressed confusion and concern. Please see email 07/21/16. Please call

## 2016-07-21 NOTE — Telephone Encounter (Signed)
Dr Erlinda Hong was contacted about pt message and mychart message. Message sent to Dr. Erlinda Hong about pt off eliquis and the cost.

## 2016-07-21 NOTE — Telephone Encounter (Signed)
I already sent a message back to her via Mychart message. I asked her to stop eliquis. She had DVT and eliquis has been on for 11 months now. It is time to stop. Thanks.   Rosalin Hawking, MD PhD Stroke Neurology 07/21/2016 2:36 PM

## 2016-08-19 ENCOUNTER — Ambulatory Visit (INDEPENDENT_AMBULATORY_CARE_PROVIDER_SITE_OTHER): Payer: BLUE CROSS/BLUE SHIELD

## 2016-08-19 ENCOUNTER — Telehealth: Payer: Self-pay | Admitting: Cardiology

## 2016-08-19 DIAGNOSIS — I5023 Acute on chronic systolic (congestive) heart failure: Secondary | ICD-10-CM

## 2016-08-19 DIAGNOSIS — I5021 Acute systolic (congestive) heart failure: Secondary | ICD-10-CM | POA: Diagnosis not present

## 2016-08-19 DIAGNOSIS — Z95810 Presence of automatic (implantable) cardiac defibrillator: Secondary | ICD-10-CM | POA: Diagnosis not present

## 2016-08-19 NOTE — Telephone Encounter (Signed)
Spoke with pt and reminded pt of remote transmission that is due today. Pt verbalized understanding.   

## 2016-08-22 NOTE — Progress Notes (Signed)
EPIC Encounter for ICM Monitoring  Patient Name: Marie Ramos is a 61 y.o. female Date: 08/22/2016 Primary Care Physican: Melinda Crutch, MD Primary Cardiologist: Allred Electrophysiologist: Allred Dry Weight:     unknown      Heart Failure questions reviewed, pt asymptomatic    Thoracic impedance normal.  Current prescribed dose of Furosemide 20 mg 1 tablet daily.  Recommendations: No changes. Reminded to limit dietary salt intake to 2000 mg/day and fluid intake to < 2 liters/day. Encouraged to call for fluid symptoms.  Follow-up plan: ICM clinic phone appointment on 09/22/2016.  Copy of ICM check sent to device physician.   3 month ICM trend: 08/19/2016   1 Year ICM trend:      [name], RN 08/22/2016 9:08 AM

## 2016-09-22 ENCOUNTER — Telehealth: Payer: Self-pay | Admitting: Cardiology

## 2016-09-22 ENCOUNTER — Ambulatory Visit (INDEPENDENT_AMBULATORY_CARE_PROVIDER_SITE_OTHER): Payer: BLUE CROSS/BLUE SHIELD | Admitting: *Deleted

## 2016-09-22 DIAGNOSIS — I429 Cardiomyopathy, unspecified: Secondary | ICD-10-CM

## 2016-09-22 DIAGNOSIS — Z95810 Presence of automatic (implantable) cardiac defibrillator: Secondary | ICD-10-CM | POA: Diagnosis not present

## 2016-09-22 DIAGNOSIS — I5023 Acute on chronic systolic (congestive) heart failure: Secondary | ICD-10-CM

## 2016-09-22 NOTE — Telephone Encounter (Signed)
LMOVM reminding pt to send remote transmission.   

## 2016-09-23 ENCOUNTER — Telehealth: Payer: Self-pay

## 2016-09-23 DIAGNOSIS — I429 Cardiomyopathy, unspecified: Secondary | ICD-10-CM | POA: Diagnosis not present

## 2016-09-23 DIAGNOSIS — Z95810 Presence of automatic (implantable) cardiac defibrillator: Secondary | ICD-10-CM

## 2016-09-23 NOTE — Progress Notes (Signed)
EPIC Encounter for ICM Monitoring  Patient Name: Marie Ramos is a 61 y.o. female Date: 09/23/2016 Primary Care Physican: Melinda Crutch, MD Primary Cardiologist: Allred Electrophysiologist: Allred Dry Weight:unknown        Attempted call to patient and unable to reach.  Left detailed message regarding transmission.  Transmission reviewed.    Thoracic impedance normal   Current prescribed dose of Furosemide 20 mg 1 tablet daily.  Recommendations: NONE - Unable to reach patient   Follow-up plan: ICM clinic phone appointment on 10/24/2016.  Copy of ICM check sent to device physician.   3 month ICM trend: 09/22/2016   1 Year ICM trend:      [name], RN 09/23/2016 4:45 PM

## 2016-09-23 NOTE — Progress Notes (Signed)
Remote ICD transmission.   

## 2016-09-23 NOTE — Therapy (Signed)
[facility] [address] [place], [place], [address] Phone: [phone]   Fax:  [phone]  Patient Details  Name: Marie Ramos MRN: [id number] Date of Birth: 1955-11-14 Referring Provider:  No ref. provider found  Encounter Date: 09/23/2016  SPEECH THERAPY DISCHARGE SUMMARY  Visits from Start of Care:  Seven  Current functional level related to goals / functional outcomes: Pt's goals were as follows on her last treatment day. She has not been seen for almost one year. She was to contact this clinic once she was back on her medication for attention.  SLP Short Term Goals - 10/26/15 1355     SLP SHORT TERM GOAL #1    Title pt will demo emergent awareness in cognitive linguistic tasks to ID 80% of errors made over three sessions    Time 1    Period Weeks    Status Not Met    SLP SHORT TERM GOAL #2    Title selective attention for 10 minutes in mod noisy environment with mod complex cognitive linguistic task    Time 1    Period Weeks    Status Not Met    SLP SHORT TERM GOAL #3    Title pt will use memory compensation system for answering questions during therapy     Status Achieved    SLP SHORT TERM GOAL #4    Title pt will tell SLP 3 non-physical deficits (related to cognition) over 3 sessions    Time 1    Period Weeks    Status On-going               SLP Long Term Goals - 10/26/15 1358     SLP LONG TERM GOAL #1    Title Pt will demo awareness to correct 95% of errors on therapy tasks    Time 4    Period Weeks    Status On-going    SLP LONG TERM GOAL #2    Title pt will demo alternating attention with rare min A cues for redirection    Time 4    Period Weeks    Status On-going    SLP LONG TERM GOAL #3    Title pt will use memory compensations successfully PRN in 4 therapy sessions    Baseline one session 10-05-15    Time 4    Period Weeks    Status On-going    SLP LONG TERM GOAL #4    Title pt  will demo anticipatory awareness in order to use compensaations for cognitive deficits in 8 therapy sessions    Time 8    Period Weeks    Status On-going        Remaining deficits: See above. Assume some deficits remain.   Education / Equipment: Importance of attention medication.   Plan: Patient agrees to discharge.  Patient goals were partially met. Patient is being discharged due to not returning since the last visit.  ?????      [place] ,MS, CCC-SLP  09/23/2016, 4:21 PM  [facility] [address] [place], [place], [address] Phone: [phone]   Fax:  [phone]

## 2016-09-23 NOTE — Telephone Encounter (Signed)
Remote ICM transmission received.  Attempted patient call and left detailed message regarding transmission and next ICM scheduled for 10/24/2016.  Advised to return call for any fluid symptoms or questions.

## 2016-09-24 ENCOUNTER — Encounter: Payer: Self-pay | Admitting: Cardiology

## 2016-09-24 LAB — CUP PACEART REMOTE DEVICE CHECK
Battery Remaining Longevity: 126 mo
Brady Statistic AS VP Percent: 0.04 %
Brady Statistic RA Percent Paced: 0.04 %
Date Time Interrogation Session: 20180320003159
HighPow Impedance: 81 Ohm
Implantable Lead Implant Date: 20170227
Implantable Lead Location: 753860
Implantable Lead Model: 5076
Implantable Pulse Generator Implant Date: 20170227
Lead Channel Impedance Value: 532 Ohm
Lead Channel Pacing Threshold Amplitude: 0.625 V
Lead Channel Pacing Threshold Pulse Width: 0.4 ms
Lead Channel Sensing Intrinsic Amplitude: 23.125 mV
Lead Channel Sensing Intrinsic Amplitude: 4.125 mV
Lead Channel Sensing Intrinsic Amplitude: 4.125 mV
Lead Channel Setting Pacing Pulse Width: 0.4 ms
MDC IDC LEAD IMPLANT DT: 20170227
MDC IDC LEAD LOCATION: 753859
MDC IDC MSMT BATTERY VOLTAGE: 3.02 V
MDC IDC MSMT LEADCHNL RA IMPEDANCE VALUE: 456 Ohm
MDC IDC MSMT LEADCHNL RV IMPEDANCE VALUE: 456 Ohm
MDC IDC MSMT LEADCHNL RV PACING THRESHOLD AMPLITUDE: 0.625 V
MDC IDC MSMT LEADCHNL RV PACING THRESHOLD PULSEWIDTH: 0.4 ms
MDC IDC MSMT LEADCHNL RV SENSING INTR AMPL: 23.125 mV
MDC IDC SET LEADCHNL RA PACING AMPLITUDE: 2 V
MDC IDC SET LEADCHNL RV PACING AMPLITUDE: 2.5 V
MDC IDC SET LEADCHNL RV SENSING SENSITIVITY: 0.3 mV
MDC IDC STAT BRADY AP VP PERCENT: 0 %
MDC IDC STAT BRADY AP VS PERCENT: 0.04 %
MDC IDC STAT BRADY AS VS PERCENT: 99.92 %
MDC IDC STAT BRADY RV PERCENT PACED: 0.04 %

## 2016-09-25 ENCOUNTER — Other Ambulatory Visit: Payer: Self-pay

## 2016-09-25 NOTE — Telephone Encounter (Signed)
Recieved faxed medication refill request for famotidine, no mention in notes as to continue this medication, please advise

## 2016-09-25 NOTE — Telephone Encounter (Signed)
She should follow up with her PCP regarding this medication.  Thanks

## 2016-09-25 NOTE — Telephone Encounter (Signed)
Called and informed [place] and patient

## 2016-10-08 ENCOUNTER — Encounter: Payer: Self-pay | Admitting: Cardiology

## 2016-10-24 ENCOUNTER — Ambulatory Visit (INDEPENDENT_AMBULATORY_CARE_PROVIDER_SITE_OTHER): Payer: BLUE CROSS/BLUE SHIELD

## 2016-10-24 DIAGNOSIS — I5023 Acute on chronic systolic (congestive) heart failure: Secondary | ICD-10-CM

## 2016-10-24 DIAGNOSIS — Z95810 Presence of automatic (implantable) cardiac defibrillator: Secondary | ICD-10-CM | POA: Diagnosis not present

## 2016-10-24 NOTE — Progress Notes (Signed)
EPIC Encounter for ICM Monitoring  Patient Name: Marie Ramos is a 61 y.o. female Date: 10/24/2016 Primary Care Physican: Melinda Crutch, MD Primary Cardiologist: Allred Electrophysiologist: Allred Dry Weight:unknown          Heart Failure questions reviewed, pt asymptomatic    Thoracic impedance normal   Current prescribed dose of Furosemide 20 mg 1 tablet daily.  Recommendations: No changes. Discussed to limit salt intake to 2000 mg/day and fluid intake to < 2 liters/day.  Encouraged to call for fluid symptoms.  Follow-up plan: ICM clinic phone appointment on 11/24/2016.    Copy of ICM check sent to device physician.   3 month ICM trend: 10/24/2016   1 Year ICM trend:      [name], RN 10/24/2016 9:39 AM

## 2016-11-03 ENCOUNTER — Other Ambulatory Visit: Payer: Self-pay | Admitting: Internal Medicine

## 2016-11-18 ENCOUNTER — Encounter: Payer: BLUE CROSS/BLUE SHIELD | Admitting: Registered Nurse

## 2016-11-24 ENCOUNTER — Ambulatory Visit (INDEPENDENT_AMBULATORY_CARE_PROVIDER_SITE_OTHER): Payer: BLUE CROSS/BLUE SHIELD

## 2016-11-24 ENCOUNTER — Telehealth: Payer: Self-pay

## 2016-11-24 DIAGNOSIS — I5023 Acute on chronic systolic (congestive) heart failure: Secondary | ICD-10-CM | POA: Diagnosis not present

## 2016-11-24 DIAGNOSIS — Z95810 Presence of automatic (implantable) cardiac defibrillator: Secondary | ICD-10-CM

## 2016-11-24 NOTE — Progress Notes (Signed)
EPIC Encounter for ICM Monitoring  Patient Name: Marie Ramos is a 61 y.o. female Date: 11/24/2016 Primary Care Physican: Lawerance Cruel, MD Primary Cardiologist: Allred Electrophysiologist: Allred Dry Weight:unknown                Attempted call to patient and unable to reach.  Left message to return call.  Transmission reviewed.    Thoracic impedance normal.  Current prescribed dose of Furosemide 20 mg 1 tablet daily.  Recommendations:  NONE - Unable to reach patient   Follow-up plan: ICM clinic phone appointment on 12/25/2016.   Copy of ICM check sent to device physician.   3 month ICM trend: 11/24/2016   1 Year ICM trend:      [name], RN 11/24/2016 10:27 AM

## 2016-11-24 NOTE — Telephone Encounter (Signed)
Remote ICM transmission received.  Attempted patient call and left message to return call.   

## 2016-11-28 NOTE — Progress Notes (Signed)
Attempted return call as requested by patient on voice mail.. Left detailed message regarding transmission and next ICM remote transmission will be 12/25/2016.  Advised to call for any fluid symptoms.

## 2016-11-28 NOTE — Progress Notes (Signed)
Received call back from patient.  She reported she is feeling fine and denied any fluid symptoms.  Transmission reviewed.  Encouraged to call for any fluid symptoms.

## 2016-12-11 ENCOUNTER — Encounter: Payer: BLUE CROSS/BLUE SHIELD | Admitting: Physical Medicine & Rehabilitation

## 2016-12-22 ENCOUNTER — Encounter: Payer: BLUE CROSS/BLUE SHIELD | Admitting: Physician Assistant

## 2016-12-25 ENCOUNTER — Ambulatory Visit (INDEPENDENT_AMBULATORY_CARE_PROVIDER_SITE_OTHER): Payer: BLUE CROSS/BLUE SHIELD | Admitting: *Deleted

## 2016-12-25 DIAGNOSIS — I5021 Acute systolic (congestive) heart failure: Secondary | ICD-10-CM | POA: Diagnosis not present

## 2016-12-25 DIAGNOSIS — I429 Cardiomyopathy, unspecified: Secondary | ICD-10-CM | POA: Diagnosis not present

## 2016-12-25 DIAGNOSIS — I5023 Acute on chronic systolic (congestive) heart failure: Secondary | ICD-10-CM

## 2016-12-25 DIAGNOSIS — Z95810 Presence of automatic (implantable) cardiac defibrillator: Secondary | ICD-10-CM

## 2016-12-25 NOTE — Progress Notes (Signed)
EPIC Encounter for ICM Monitoring  Patient Name: Marie Ramos is a 61 y.o. female Date: 12/25/2016 Primary Care Physican: Lawerance Cruel, MD Primary Cardiologist: Allred Electrophysiologist: Allred Dry Weight:unknown      Heart Failure questions reviewed, pt asymptomatic.   Thoracic impedance abnormal suggesting fluid accumulation since 12/21/2016.  Current prescribed dose of Furosemide 20 mg 1 tablet daily.  Recommendations:  Advised to take Furosemide 20 mg 1 tablet bid x 3 days and then return to prescribed dosage of 1 tablet daily.  Follow-up plan: ICM clinic phone appointment on 12/29/2016 to recheck fluid levels.  Office appointment scheduled on 01/09/2017 with Tommye Standard, PA.  Copy of ICM check sent to device physician.   3 month ICM trend: 12/25/2016   1 Year ICM trend:      [name], RN 12/25/2016 11:38 AM

## 2016-12-26 ENCOUNTER — Encounter: Payer: Self-pay | Admitting: Cardiology

## 2016-12-29 ENCOUNTER — Telehealth: Payer: Self-pay

## 2016-12-29 ENCOUNTER — Ambulatory Visit (INDEPENDENT_AMBULATORY_CARE_PROVIDER_SITE_OTHER): Payer: Self-pay

## 2016-12-29 DIAGNOSIS — Z95810 Presence of automatic (implantable) cardiac defibrillator: Secondary | ICD-10-CM

## 2016-12-29 DIAGNOSIS — I5023 Acute on chronic systolic (congestive) heart failure: Secondary | ICD-10-CM

## 2016-12-29 NOTE — Telephone Encounter (Signed)
Remote ICM transmission received.  Attempted patient call and left detailed message regarding transmission and next ICM scheduled for 02/09/2017.  Advised to return call for any fluid symptoms or questions.    

## 2016-12-29 NOTE — Progress Notes (Signed)
EPIC Encounter for ICM Monitoring  Patient Name: Marie Ramos is a 61 y.o. female Date: 12/29/2016 Primary Care Physican: Lawerance Cruel, MD Primary Cardiologist: Allred Electrophysiologist: Allred Dry Weight:unknown        Attempted call to patient and unable to reach.  Left detailed message regarding transmission.  Transmission reviewed.    Thoracic impedance returned to normal after 3 days of increased Furosemide.  Prescribed dosage: Furosemide 20 mg 1 tablet daily.  Recommendations: Left voice mail with ICM number and encouraged to call for fluid symptoms.  Follow-up plan: ICM clinic phone appointment on 02/09/2017. Office appointment scheduled on 01/09/2017 with Tommye Standard, PA.  Copy of ICM check sent to device physician.   3 month ICM trend: 12/29/2016         1 Year ICM trend:      [name], RN 12/29/2016 7:32 AM

## 2016-12-31 ENCOUNTER — Encounter: Payer: BLUE CROSS/BLUE SHIELD | Admitting: Physical Medicine & Rehabilitation

## 2017-01-07 LAB — CUP PACEART REMOTE DEVICE CHECK
Brady Statistic AS VP Percent: 0.06 %
Brady Statistic RA Percent Paced: 0.03 %
HighPow Impedance: 80 Ohm
Implantable Lead Implant Date: 20170227
Implantable Lead Location: 753860
Lead Channel Impedance Value: 532 Ohm
Lead Channel Pacing Threshold Amplitude: 0.875 V
Lead Channel Pacing Threshold Pulse Width: 0.4 ms
Lead Channel Pacing Threshold Pulse Width: 0.4 ms
Lead Channel Sensing Intrinsic Amplitude: 22.125 mV
Lead Channel Sensing Intrinsic Amplitude: 3.75 mV
Lead Channel Sensing Intrinsic Amplitude: 3.75 mV
Lead Channel Setting Pacing Pulse Width: 0.4 ms
MDC IDC LEAD IMPLANT DT: 20170227
MDC IDC LEAD LOCATION: 753859
MDC IDC MSMT BATTERY REMAINING LONGEVITY: 123 mo
MDC IDC MSMT BATTERY VOLTAGE: 3.01 V
MDC IDC MSMT LEADCHNL RA IMPEDANCE VALUE: 456 Ohm
MDC IDC MSMT LEADCHNL RV IMPEDANCE VALUE: 399 Ohm
MDC IDC MSMT LEADCHNL RV PACING THRESHOLD AMPLITUDE: 0.5 V
MDC IDC MSMT LEADCHNL RV SENSING INTR AMPL: 22.125 mV
MDC IDC PG IMPLANT DT: 20170227
MDC IDC SESS DTM: 20180621083826
MDC IDC SET LEADCHNL RA PACING AMPLITUDE: 2.25 V
MDC IDC SET LEADCHNL RV PACING AMPLITUDE: 2.5 V
MDC IDC SET LEADCHNL RV SENSING SENSITIVITY: 0.3 mV
MDC IDC STAT BRADY AP VP PERCENT: 0 %
MDC IDC STAT BRADY AP VS PERCENT: 0.03 %
MDC IDC STAT BRADY AS VS PERCENT: 99.91 %
MDC IDC STAT BRADY RV PERCENT PACED: 0.06 %

## 2017-01-09 ENCOUNTER — Encounter: Payer: Self-pay | Admitting: Cardiology

## 2017-01-09 ENCOUNTER — Ambulatory Visit (INDEPENDENT_AMBULATORY_CARE_PROVIDER_SITE_OTHER): Payer: BLUE CROSS/BLUE SHIELD | Admitting: Physician Assistant

## 2017-01-09 VITALS — BP 140/92 | HR 85 | Ht 62.0 in | Wt 270.0 lb

## 2017-01-09 DIAGNOSIS — I5032 Chronic diastolic (congestive) heart failure: Secondary | ICD-10-CM | POA: Diagnosis not present

## 2017-01-09 DIAGNOSIS — Z95810 Presence of automatic (implantable) cardiac defibrillator: Secondary | ICD-10-CM | POA: Diagnosis not present

## 2017-01-09 DIAGNOSIS — Z79899 Other long term (current) drug therapy: Secondary | ICD-10-CM

## 2017-01-09 DIAGNOSIS — I110 Hypertensive heart disease with heart failure: Secondary | ICD-10-CM

## 2017-01-09 DIAGNOSIS — R9431 Abnormal electrocardiogram [ECG] [EKG]: Secondary | ICD-10-CM

## 2017-01-09 NOTE — Progress Notes (Signed)
Patient ID: Marie Ramos, female   DOB: Dec 25, 1955, 61 y.o.   MRN: [id number]    Cardiology Office Note Date:  01/09/2017  Patient ID:  Marie, Ramos [birth year], MRN [id number] PCP:  Lawerance Cruel, MD  Cardiologist:  None prior to hospital, (new) Dr. Burt Knack Electrophysiologist: Dr. Rayann Heman    Chief Complaint: routine EP visit  History of Present Illness: Marie Ramos is a 61 y.o. female with history of cardiac arrest 08/24/15, she was at work, AED was immediately applied and recommended and received 2 shocks and CPR, FR found her in PEA with ROSC after epinephrine.    08/24/15: presumd VF arrest with AED advising shocks, CPR, PEA , she had emergent cardiac cath with no CAD, long QT on her EKG noted. Hypokalemic noted as well.   She underwent hypothermia protocol.  Suspected her psych meds were the etiology pf her QT prolongation.  Echo suggested possible HCM,, MRI revealed hypertensive CM, not HCM.  MRI 2/26 noted CVA, neuro on case, possibly related to cath procedure, not recommended to have TEE or further w/u.  She underwent ICD implant  By Dr. Rayann Heman on 09/04/15.  Psychiatry was consulted and aided in her medications, to avoid QT prolonging meds. Discharged from CIR on the current regime. She developed DVT to LLE started on Eliquis.  She was discharged to CIR until 09/14/15 and has undergone out patient rehab since then.  She comes in today being seen for Dr. Rayann Heman, she was last seen by him in December 2017, had c/o DOE and felt to have volume OL and daily lasix was started with f/u labs planned.   Her ARB was increased with HTN as well.  She is enrolled with ICM clinic.  She is feeling very well. Just back from new [place] visiting family.  She denies any kind of CP, palpitations, no rest SOB, mild DOE that seems to be her baseline post hospitalization.  No dizziness, near syncope or syncope.  Her Eliquis was stopped by one of her MD's, she can't recall which one (looks like  neurology)  Device information: MDT dual chamber ICD implanted 09/03/15, Dr. Rayann Heman, secondary prevention/VF arrest  Past Medical History:  Diagnosis Date  . Bipolar 1 disorder ([place])   . Cardiac arrest ([place]) 08/24/2015  . Chronically dry eyes   . Hypertrophic obstructive cardiomyopathy ([place]) 08/29/2015  . Macular degeneration   . QT prolongation 08/29/2015  . Stroke ([place])   . TBI (traumatic brain injury) [facility]) 2011   Inpatient rehab [facility]  . Ventricular fibrillation ([place]) 09/03/15   MDT ICD Dr. Rayann Heman    Past Surgical History:  Procedure Laterality Date  . ABDOMINAL HYSTERECTOMY    . CARDIAC CATHETERIZATION N/A 08/24/2015   Procedure: Left Heart Cath and Coronary Angiography;  Surgeon: Sherren Mocha, MD;  Location: [place] [facility];  Service: Cardiovascular;  Laterality: N/A;  . EP IMPLANTABLE DEVICE N/A 09/03/2015   MDT dual chamber ICD  . SPLENECTOMY, TOTAL  2011  . TONSILLECTOMY      Current Outpatient Prescriptions  Medication Sig Dispense Refill  . acetaminophen (TYLENOL) 325 MG tablet Take 1-2 tablets (325-650 mg total) by mouth every 4 (four) hours as needed for mild pain.    Marland Kitchen ALPRAZolam (XANAX) 0.25 MG tablet Take 1 tablet (0.25 mg total) by mouth at bedtime as needed for anxiety or sleep. 30 tablet 0  . amLODipine (NORVASC) 5 MG tablet Take 1 tablet (5 mg total) by mouth daily with breakfast. 30  tablet 0  . atorvastatin (LIPITOR) 20 MG tablet Take 1 tablet (20 mg total) by mouth daily at 6 PM. 30 tablet 0  . calcipotriene (DOVONOX) 0.005 % cream as directed.   0  . carvedilol (COREG) 12.5 MG tablet TAKE 1 TABLET BY MOUTH TWICE DAILY WITH BREAKFAST AND SUPPER 180 tablet 1  . famotidine (PEPCID) 20 MG tablet Take 20 mg by mouth at bedtime.    . fexofenadine (ALLEGRA) 180 MG tablet Take 180 mg by mouth daily.    . fluticasone (FLONASE) 50 MCG/ACT nasal spray Place into both nostrils daily. Use as directed    . furosemide (LASIX) 20 MG tablet Take 1 tablet (20 mg total) by  mouth daily. 90 tablet 3  . losartan (COZAAR) 100 MG tablet Take 1 tablet (100 mg total) by mouth daily. 90 tablet 3  . multivitamin (PROSIGHT) TABS tablet Take 1 tablet by mouth daily. 30 each 0  . Oxcarbazepine (TRILEPTAL) 300 MG tablet Take 1 tablet (300 mg total) by mouth 2 (two) times daily. 60 tablet 0  . traMADol (ULTRAM) 50 MG tablet Take 1 tablet (50 mg total) by mouth every 12 (twelve) hours as needed for moderate pain. 60 tablet 4  . venlafaxine XR (EFFEXOR-XR) 75 MG 24 hr capsule Take 75 mg by mouth daily.  4   No current facility-administered medications for this visit.     Allergies:   Naproxen and Vicodin [hydrocodone-acetaminophen]   Social History:  The patient  reports that she has never smoked. She has never used smokeless tobacco. She reports that she does not drink alcohol or use drugs.   Family History:  The patient's family history includes COPD in her father; Cancer in her mother.  ROS:  Please see the history of present illness.    All other systems are reviewed and otherwise negative.   PHYSICAL EXAM:  VS:  BP (!) 140/92   Pulse 85   Ht 5\' 2"  (1.575 m)   Wt 270 lb (122.5 kg)   BMI 49.38 kg/m  BMI: Body mass index is 49.38 kg/m. Well nourished, well developed, in no acute distress  HEENT: normocephalic, atraumatic  Neck: no JVD, carotid bruits or masses Cardiac: RRR; no significant murmurs, no rubs, or gallops Lungs:  CTA b/l , no wheezing, rhonchi or rales  Abd: soft, nontender MS: no deformity or atrophy Ext: no edema  Skin: warm and dry, no rash Neuro:  No gross deficits appreciated Psych: euthymic mood, full affect  ICD site is stable,well healed, no tethering or discomfort   EKG:  Done today and reviewed by myself: SR, QT looks stable ICD interrogation today: battery and lead measurements are good, no episodes or observations.  07/14/16: TTE Study Conclusions - Left ventricle: The cavity size was normal. Wall thickness was   increased in a  pattern of mild LVH. Systolic function was normal.   The estimated ejection fraction was in the range of 55% to 60%.   Wall motion was normal; there were no regional wall motion   abnormalities. Doppler parameters are consistent with abnormal   left ventricular relaxation (grade 1 diastolic dysfunction). - Aortic valve: There was no stenosis. - Mitral valve: Mildly calcified annulus. There was no significant   regurgitation. - Right ventricle: The cavity size was normal. Pacer wire or   catheter noted in right ventricle. Systolic function was normal. - Pulmonary arteries: No complete TR doppler jet so unable to   estimate PA systolic pressure. - Inferior vena  cava: The vessel was normal in size. The   respirophasic diameter changes were in the normal range (= 50%),   consistent with normal central venous pressure. Impressions: - Normal LV size with mild LV hypertrophy. EF 55-60%. Normal RV   size and systolic function. No significant valvular   abnormalities.  08/24/15: Echocardiogram:  Study Conclusions - Left ventricle: The cavity size was normal. Systolic function was  mildly to moderately reduced. The estimated ejection fraction was  in the range of 40% to 45%. There is severe asymmetric septal  hypetrophy without dynamic obstruction. Findings suggestive of  hypertrophic nonobstructive cardiomyopathy. Diffuse hypokinesis.  Doppler parameters are consistent with abnormal left ventricular  relaxation (grade 1 diastolic dysfunction). Septal thickness, ED  (2D): 17 mm. Posterior wall thickness, ED (2D): 10.5 mm. - Technically difficult study. Impressions: - Technically difficult study, however appears to have asymmetric  septal hypertrophic cardiomyoapathy without obstruction. In  setting of VF arrest, would consider cardiac MRI to better  evaluate as this could be the potential etiology of her  arrhythmia.  08/24/15: LHC Conclusion    Widely patent coronary  arteries Normal LVEDP       Recent Labs: 06/23/2016: Brain Natriuretic Peptide 24.3; BUN 29; Creat 1.02; Magnesium 2.1; Potassium 4.8; Sodium 139  No results found for requested labs within last 8760 hours.   CrCl cannot be calculated (Patient's most recent lab result is older than the maximum 21 days allowed.).   Wt Readings from Last 3 Encounters:  01/09/17 270 lb (122.5 kg)  06/23/16 266 lb (120.7 kg)  04/09/16 261 lb 3.2 oz (118.5 kg)     Other studies reviewed: Additional studies/records reviewed today include: summarized above  DEVICE information:  MDT dual chamber ICD (MRI compatible), implanted 09/03/15, Dr. Rayann Heman  ASSESSMENT AND PLAN:  1. VF arrest, QT prolongation felt secondary to psych meds     MDT ICD, normal function      2. Bipolar     Continue f/u with psych     Avoid QT prolonging medications and caution with antipsychotics in the futre  3. HTN, hypertensive CM, chronic CHF (disatolic)     On BB/ARB tx     exam is compensated, weight is up but no exam evidence of fluid OL, no symptoms of fluid OL, Optivol looks good     Device notes stable OptiVol and good daily patient activity      Her BP is high here today, she had liberalized her diet while on vacation  And reminded to get back on track with that. She checks her BP at the store and gets 140/70 range       She will get back on low salt diet, encouraged exercise and weight loss       RN visit for BP check in 1 month       Continue monthly ICM clinic checks  4. CVA     C/w as per neurlogy  5. LLE DVT     Off Eliquis per neurology   Disposition: q3 month [facility] remotes, 25month in-clinic visit, sooner if needed.  BMET today. Current medicines are reviewed at length with the patient today.  The patient did not have any concerns regarding medicines.  Haywood Lasso, PA-C 01/09/2017 3:57 PM     [place] [place] [address] [phone] (office)    [phone] (fax)

## 2017-01-09 NOTE — Patient Instructions (Signed)
Medication Instructions:   Your physician recommends that you continue on your current medications as directed. Please refer to the Current Medication list given to you today.    If you need a refill on your cardiac medications before your next appointment, please call your pharmacy.  Labwork: BMET TODAY    Testing/Procedures: NONE ORDERED  TODAY   Follow-Up: Your physician wants you to follow-up in:  [place] will receive a reminder letter in the mail two months in advance. If you don't receive a letter, please call our office to schedule the follow-up appointment.   Remote monitoring is used to monitor your Pacemaker of ICD from home. This monitoring reduces the number of office visits required to check your device to one time per year. It allows [name] to keep an eye on the functioning of your device to ensure it is working properly. You are scheduled for a device check from home on . 04-13-2017 You may send your transmission at any time that day. If you have a wireless device, the transmission will be sent automatically. After your physician reviews your transmission, you will receive a postcard with your next transmission date.   Any Other Special Instructions Will Be Listed Below (If Applicable).

## 2017-01-10 LAB — BASIC METABOLIC PANEL
BUN / CREAT RATIO: 27 (ref 12–28)
BUN: 24 mg/dL (ref 8–27)
CALCIUM: 9.9 mg/dL (ref 8.7–10.3)
CHLORIDE: 103 mmol/L (ref 96–106)
CO2: 26 mmol/L (ref 20–29)
Creatinine, Ser: 0.88 mg/dL (ref 0.57–1.00)
GFR calc non Af Amer: 71 mL/min/{1.73_m2} (ref >59)
GFR, EST AFRICAN AMERICAN: 82 mL/min/{1.73_m2} (ref >59)
Glucose: 112 mg/dL — ABNORMAL HIGH (ref 65–99)
POTASSIUM: 4.5 mmol/L (ref 3.5–5.2)
Sodium: 145 mmol/L — ABNORMAL HIGH (ref 134–144)

## 2017-01-23 ENCOUNTER — Encounter: Payer: Self-pay | Admitting: Physical Medicine & Rehabilitation

## 2017-01-23 ENCOUNTER — Encounter: Payer: BLUE CROSS/BLUE SHIELD | Attending: Registered Nurse | Admitting: Physical Medicine & Rehabilitation

## 2017-01-23 VITALS — BP 171/95 | HR 75 | Resp 14

## 2017-01-23 DIAGNOSIS — Z825 Family history of asthma and other chronic lower respiratory diseases: Secondary | ICD-10-CM | POA: Insufficient documentation

## 2017-01-23 DIAGNOSIS — G931 Anoxic brain damage, not elsewhere classified: Secondary | ICD-10-CM | POA: Diagnosis not present

## 2017-01-23 DIAGNOSIS — I2510 Atherosclerotic heart disease of native coronary artery without angina pectoris: Secondary | ICD-10-CM | POA: Insufficient documentation

## 2017-01-23 DIAGNOSIS — I421 Obstructive hypertrophic cardiomyopathy: Secondary | ICD-10-CM | POA: Diagnosis not present

## 2017-01-23 DIAGNOSIS — F319 Bipolar disorder, unspecified: Secondary | ICD-10-CM | POA: Insufficient documentation

## 2017-01-23 DIAGNOSIS — I69919 Unspecified symptoms and signs involving cognitive functions following unspecified cerebrovascular disease: Secondary | ICD-10-CM

## 2017-01-23 DIAGNOSIS — I10 Essential (primary) hypertension: Secondary | ICD-10-CM

## 2017-01-23 DIAGNOSIS — Z8674 Personal history of sudden cardiac arrest: Secondary | ICD-10-CM | POA: Insufficient documentation

## 2017-01-23 DIAGNOSIS — Z95810 Presence of automatic (implantable) cardiac defibrillator: Secondary | ICD-10-CM | POA: Insufficient documentation

## 2017-01-23 DIAGNOSIS — I69398 Other sequelae of cerebral infarction: Secondary | ICD-10-CM | POA: Insufficient documentation

## 2017-01-23 DIAGNOSIS — H3530 Unspecified macular degeneration: Secondary | ICD-10-CM | POA: Diagnosis not present

## 2017-01-23 DIAGNOSIS — M62838 Other muscle spasm: Secondary | ICD-10-CM

## 2017-01-23 MED ORDER — METHOCARBAMOL 500 MG PO TABS: 500.0000 mg | ORAL_TABLET | Freq: Two times a day (BID) | ORAL | 3 refills | Status: DC | PRN

## 2017-01-23 NOTE — Progress Notes (Signed)
Subjective:    Patient ID: Marie Ramos, female    DOB: 06-Dec-1955, 61 y.o.   MRN: [id number]  HPI  [name] is a 61 year old right- handed female with history of CAD, bipolar disorder who presents for follow up for embolic watershed stroke with mild anoxic brain injury. She had resulting gait abnormalities and congitive deficits.   Last clinic visit 11/29/15.  Since last visit, she continues HEP.  She started doing aquatic therapy, but is now caring for her grand-daughter.  She continues to follow with Psychiatry.  Her BP remains elevated.  She states she is following up with her PCP regarding elevation.  She ran out of Robaxin.  She has put on some weight.   Pain Inventory Average Pain 5 Pain Right Now 2 My pain is intermittent and aching  In the last 24 hours, has pain interfered with the following? General activity 0 Relation with others 0 Enjoyment of life 1 What TIME of day is your pain at its worst? evening Sleep (in general) Fair  Pain is worse with: some activites Pain improves with: rest and medication Relief from Meds: 9  Mobility how many minutes can you walk? 15-20 ability to climb steps?  yes do you drive?  yes Do you have any goals in this area?  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  no  Physicians involved in your care Any changes since last visit?  no   Family History  Problem Relation Age of Onset  . Cancer Mother   . COPD Father    Social History   Social History  . Marital status: Married    Spouse name: N/A  . Number of children: N/A  . Years of education: N/A   Social History Main Topics  . Smoking status: Never Smoker  . Smokeless tobacco: Never Used  . Alcohol use No  . Drug use: No  . Sexual activity: Not Asked   Other Topics Concern  . None   Social History Narrative  . None   Past Surgical  History:  Procedure Laterality Date  . ABDOMINAL HYSTERECTOMY    . CARDIAC CATHETERIZATION N/A 08/24/2015   Procedure: Left Heart Cath and Coronary Angiography;  Surgeon: Sherren Mocha, MD;  Location: [place] [facility];  Service: Cardiovascular;  Laterality: N/A;  . EP IMPLANTABLE DEVICE N/A 09/03/2015   MDT dual chamber ICD  . SPLENECTOMY, TOTAL  2011  . TONSILLECTOMY     Past Medical History:  Diagnosis Date  . Bipolar 1 disorder ([place])   . Cardiac arrest ([place]) 08/24/2015  . Chronically dry eyes   . Hypertrophic obstructive cardiomyopathy ([place]) 08/29/2015  . Macular degeneration   . QT prolongation 08/29/2015  . Stroke ([place])   . TBI (traumatic brain injury) [facility]) 2011   Inpatient rehab [facility]  . Ventricular fibrillation ([place]) 09/03/15   MDT ICD Dr. Rayann Heman   BP (!) 171/95   Pulse 75   Resp 14   SpO2 98%   Opioid Risk Score:   Fall Risk Score:  `1  Depression screen PHQ 2/9  Depression screen PHQ 2/9 09/26/2015  Decreased Interest 0  Down, Depressed, Hopeless 0  PHQ - 2 Score 0  Altered sleeping 3  Tired, decreased energy 1  Change in appetite 0  Feeling bad or failure about yourself  0  Trouble concentrating 2  Moving slowly or  fidgety/restless 0  Suicidal thoughts 0  PHQ-9 Score 6  Difficult doing work/chores Not difficult at all   Review of Systems  Constitutional: Negative.   HENT: Negative.   Eyes: Negative.   Respiratory: Positive for shortness of breath.   Cardiovascular: Negative.   Gastrointestinal: Negative.   Endocrine: Negative.   Genitourinary: Negative.   Musculoskeletal: Positive for arthralgias, back pain and myalgias.  Skin: Negative.   Allergic/Immunologic: Negative.   Neurological: Negative for dizziness, speech difficulty and weakness.  Hematological: Bruises/bleeds easily.  All other systems reviewed and are negative.     Objective:   Physical Exam  Constitutional: She appears well-developed and well-nourished. NAD. Vital signs  reviewed.  HENT: Normocephalic and atraumatic.  Eyes: EOM are normal. No discharge. Cardiovascular: RRR. No JVD. Respiratory: Effort normal and breath sounds normal.  GI: Soft. Bowel sounds are normal.   Musculoskeletal: She exhibits no edema or tenderness.  Neurological: She is alert and oriented.  Able to follow simple commands.  Motor:  RUE: 5/5 proximal to distal LUE: 5/5 proximal to distal LLE: Hip flexion 5/5, knee extension 5/5, ankle dorsi/plantarflexion 5/5  RLE: Hip flexion 5/5, knee extension 5/5, ankle dorsi/plantarflexion 5/5  Skin: Warm and dry. Psychiatric: Verbose, otherwise, relatively normal mood and affect.     Assessment & Plan:  [name] is a 61 year old right- handed female with history of CAD, bipolar disorder who presents for follow up for embolic watershed stroke with mild anoxic brain injury.   1.  Late effects emolic watershed stroke with mild anoxic brain injury  Cont meds  Completed PT, cont HEP  Cont aquatic therapy   Discharged from Neurology   2. Hypertension.  Cont meds  Remains elevated.  Pt states she is going to follow up with her PCP.  3. Muscle spasms  Will refill Robaxin 500mg  BID PRN  4. Morbid obesity  Encouraged weight loss  Pt would like to consider seeing a dietitian before referral

## 2017-02-04 ENCOUNTER — Telehealth: Payer: Self-pay | Admitting: Internal Medicine

## 2017-02-04 NOTE — Telephone Encounter (Signed)
New message    Pt is calling to find out if she can go to her PCP for BP check. She was scheduled for Monday and had to cancel due to transportation.

## 2017-02-09 ENCOUNTER — Ambulatory Visit: Payer: BLUE CROSS/BLUE SHIELD

## 2017-02-09 ENCOUNTER — Ambulatory Visit (INDEPENDENT_AMBULATORY_CARE_PROVIDER_SITE_OTHER): Payer: BLUE CROSS/BLUE SHIELD

## 2017-02-09 DIAGNOSIS — I5032 Chronic diastolic (congestive) heart failure: Secondary | ICD-10-CM

## 2017-02-09 DIAGNOSIS — Z95810 Presence of automatic (implantable) cardiac defibrillator: Secondary | ICD-10-CM

## 2017-02-09 DIAGNOSIS — I110 Hypertensive heart disease with heart failure: Secondary | ICD-10-CM

## 2017-02-09 NOTE — Progress Notes (Signed)
EPIC Encounter for ICM Monitoring  Patient Name: Marie Ramos is a 61 y.o. female Date: 02/09/2017 Primary Care Physican: Lawerance Cruel, MD Primary Cardiologist: Allred Electrophysiologist: Allred Dry Weight:unknown              Heart Failure questions reviewed, pt asymptomatic.  Patient said her BP was high about 3 weeks ago and at PCP office visit today it was BP 132/80.  PCP said that pressure was fine.  She also had lab work done.    Thoracic impedance normal .  Prescribed dosage: Furosemide 20 mg 1 tablet daily.  Recommendations: No changes.  Encouraged to call for fluid symptoms.  Follow-up plan: ICM clinic phone appointment on 03/12/2017.  Office appointment scheduled 06/12/2017 with Dr. Rayann Heman.  Copy of ICM check sent to device physician.   3 month ICM trend: 02/09/2017   1 Year ICM trend:      [name], RN 02/09/2017 12:40 PM

## 2017-02-10 NOTE — Telephone Encounter (Signed)
Copied from ICM note from 02/09/17:  Heart Failure questions reviewed, pt asymptomatic.  Patient said her BP was high about 3 weeks ago and at PCP office visit today it was BP 132/80.  PCP said that pressure was fine.  She also had lab work done.

## 2017-03-12 ENCOUNTER — Ambulatory Visit (INDEPENDENT_AMBULATORY_CARE_PROVIDER_SITE_OTHER): Payer: BLUE CROSS/BLUE SHIELD

## 2017-03-12 DIAGNOSIS — Z95810 Presence of automatic (implantable) cardiac defibrillator: Secondary | ICD-10-CM | POA: Diagnosis not present

## 2017-03-12 DIAGNOSIS — I5023 Acute on chronic systolic (congestive) heart failure: Secondary | ICD-10-CM

## 2017-03-12 NOTE — Progress Notes (Signed)
EPIC Encounter for ICM Monitoring  Patient Name: Marie Ramos is a 61 y.o. female Date: 03/12/2017 Primary Care Physican: Lawerance Cruel, MD Primary Cardiologist: Allred Electrophysiologist: Allred Dry Weight:unknown      Heart Failure questions reviewed, pt asymptomatic.   Thoracic impedance normal.  Prescribed dosage: Furosemide 20 mg 1 tablet daily.  Recommendations: No changes.   Encouraged to call for fluid symptoms.  Follow-up plan: ICM clinic phone appointment on 04/13/2017.  Office appointment scheduled 06/12/2017 with Dr. Rayann Heman.  Copy of ICM check sent to Dr. Rayann Heman.   3 month ICM trend: 03/12/2017   1 Year ICM trend:      Marie Billings, RN 03/12/2017 11:47 AM

## 2017-04-13 ENCOUNTER — Ambulatory Visit (INDEPENDENT_AMBULATORY_CARE_PROVIDER_SITE_OTHER): Payer: BLUE CROSS/BLUE SHIELD | Admitting: *Deleted

## 2017-04-13 ENCOUNTER — Telehealth: Payer: Self-pay | Admitting: Cardiology

## 2017-04-13 DIAGNOSIS — I469 Cardiac arrest, cause unspecified: Secondary | ICD-10-CM

## 2017-04-13 DIAGNOSIS — I429 Cardiomyopathy, unspecified: Secondary | ICD-10-CM

## 2017-04-13 DIAGNOSIS — Z95810 Presence of automatic (implantable) cardiac defibrillator: Secondary | ICD-10-CM

## 2017-04-13 NOTE — Telephone Encounter (Signed)
Spoke with pt and reminded pt of remote transmission that is due today. Pt verbalized understanding.   

## 2017-04-14 ENCOUNTER — Telehealth: Payer: Self-pay

## 2017-04-14 NOTE — Progress Notes (Signed)
EPIC Encounter for ICM Monitoring  Patient Name: Marie Ramos is a 61 y.o. female Date: 04/14/2017 Primary Care Physican: Lawerance Cruel, MD Primary Cardiologist: Allred Electrophysiologist: Allred Dry Weight:unknown      Attempted call to patient and unable to reach.  Left message to return call.  Transmission reviewed.    Thoracic impedance close to baseline normal.  Prescribed dosage: Furosemide 20 mg 1 tablet daily  Labs: 01/09/2017 Creatinine 0.88, BUN 24, Potassium 4.5, Sodium 145, EGFR 71-82  Recommendations: NONE - Unable to reach patient   Follow-up plan: ICM clinic phone appointment on 05/14/2017.  Office appointment scheduled 06/22/2017 with Dr. Rayann Heman.  Copy of ICM check sent to Dr. Rayann Heman.   3 month ICM trend: 04/13/2017   1 Year ICM trend:      [name], RN 04/14/2017 11:24 AM

## 2017-04-14 NOTE — Progress Notes (Signed)
Patient returned call.  She stated she is feeling fine and denied any fluid symptoms.  She has been eating out at restaurants lately so that may be reason for some fluid accumulation.  She is unsure of weight.  No changes today and next ICM remote transmission 05/14/2017

## 2017-04-14 NOTE — Progress Notes (Signed)
Remote ICD transmission.   

## 2017-04-14 NOTE — Telephone Encounter (Signed)
Remote ICM transmission received.  Attempted call to patient and left message to return call. 

## 2017-04-15 LAB — CUP PACEART REMOTE DEVICE CHECK
Brady Statistic AP VP Percent: 0 %
Brady Statistic AS VP Percent: 0.05 %
Brady Statistic AS VS Percent: 99.94 %
HighPow Impedance: 83 Ohm
Implantable Lead Implant Date: 20170227
Implantable Lead Model: 5076
Lead Channel Impedance Value: 456 Ohm
Lead Channel Pacing Threshold Pulse Width: 0.4 ms
Lead Channel Pacing Threshold Pulse Width: 0.4 ms
Lead Channel Sensing Intrinsic Amplitude: 22.375 mV
Lead Channel Sensing Intrinsic Amplitude: 22.375 mV
Lead Channel Sensing Intrinsic Amplitude: 3.5 mV
Lead Channel Setting Pacing Amplitude: 2 V
Lead Channel Setting Pacing Pulse Width: 0.4 ms
MDC IDC LEAD IMPLANT DT: 20170227
MDC IDC LEAD LOCATION: 753859
MDC IDC LEAD LOCATION: 753860
MDC IDC MSMT BATTERY REMAINING LONGEVITY: 120 mo
MDC IDC MSMT BATTERY VOLTAGE: 3.01 V
MDC IDC MSMT LEADCHNL RA PACING THRESHOLD AMPLITUDE: 1 V
MDC IDC MSMT LEADCHNL RA SENSING INTR AMPL: 3.5 mV
MDC IDC MSMT LEADCHNL RV IMPEDANCE VALUE: 399 Ohm
MDC IDC MSMT LEADCHNL RV IMPEDANCE VALUE: 551 Ohm
MDC IDC MSMT LEADCHNL RV PACING THRESHOLD AMPLITUDE: 0.5 V
MDC IDC PG IMPLANT DT: 20170227
MDC IDC SESS DTM: 20181008203730
MDC IDC SET LEADCHNL RV PACING AMPLITUDE: 2.5 V
MDC IDC SET LEADCHNL RV SENSING SENSITIVITY: 0.3 mV
MDC IDC STAT BRADY AP VS PERCENT: 0.01 %
MDC IDC STAT BRADY RA PERCENT PACED: 0.02 %
MDC IDC STAT BRADY RV PERCENT PACED: 0.05 %

## 2017-04-17 ENCOUNTER — Encounter: Payer: Self-pay | Admitting: Cardiology

## 2017-05-02 ENCOUNTER — Other Ambulatory Visit: Payer: Self-pay | Admitting: Internal Medicine

## 2017-05-14 ENCOUNTER — Ambulatory Visit (INDEPENDENT_AMBULATORY_CARE_PROVIDER_SITE_OTHER): Payer: BLUE CROSS/BLUE SHIELD

## 2017-05-14 DIAGNOSIS — Z95810 Presence of automatic (implantable) cardiac defibrillator: Secondary | ICD-10-CM

## 2017-05-14 DIAGNOSIS — I5023 Acute on chronic systolic (congestive) heart failure: Secondary | ICD-10-CM

## 2017-05-14 DIAGNOSIS — I429 Cardiomyopathy, unspecified: Secondary | ICD-10-CM | POA: Diagnosis not present

## 2017-05-14 NOTE — Progress Notes (Signed)
EPIC Encounter for ICM Monitoring  Patient Name: Marie Ramos is a 61 y.o. female Date: 05/14/2017 Primary Care Physican: Lawerance Cruel, MD Primary Cardiologist: Allred Electrophysiologist: Allred Dry Weight:unknown       Attempted call to patient and unable to reach.  Left detailed message regarding transmission.  Transmission reviewed.    Optivol: Thoracic impedance normal.  Prescribed dosage: Furosemide 20 mg 1 tablet daily  Labs: 01/09/2017 Creatinine 0.88, BUN 24, Potassium 4.5, Sodium 145, EGFR 71-82  Recommendations:  Left voice mail with ICM number and encouraged to call if experiencing any fluid symptoms.  Follow-up plan: ICM clinic phone appointment on 07/23/2017.  Office appointment scheduled 06/22/2017 with Dr. Rayann Heman.  Copy of ICM check sent to Dr. Rayann Heman.   3 month ICM trend: 05/14/2017    1 Year ICM trend:       [name], RN 05/14/2017 1:19 PM

## 2017-06-04 ENCOUNTER — Telehealth: Payer: Self-pay | Admitting: Physical Medicine & Rehabilitation

## 2017-06-04 NOTE — Telephone Encounter (Signed)
Pt phoned to stated that her last visit it was discussed for her to have injections. Pt has apt in Jan. But she wishes to have an earlier apt.  Appointment was set for procedure please advise of what injections and I will notate on appointment. Also patient currently takes Ultram 50mg , she is requesting a refill. Please advise.

## 2017-06-05 ENCOUNTER — Other Ambulatory Visit: Payer: Self-pay

## 2017-06-05 MED ORDER — TRAMADOL HCL 50 MG PO TABS: 50.0000 mg | ORAL_TABLET | Freq: Two times a day (BID) | ORAL | 0 refills | Status: DC | PRN

## 2017-06-05 NOTE — Telephone Encounter (Signed)
Contacted patient, left a voice message to have her call us back to make an appointment. I asked to specify what kind of injection she was hoping for (body part....etc...)

## 2017-06-05 NOTE — Telephone Encounter (Signed)
Unfortunately, I do not recall what the injection discuss was at this time.  It is most likely a joint injection. We may schedule for a joint injection earlier than January if she would like.  Thanks.

## 2017-06-18 ENCOUNTER — Encounter
Payer: BLUE CROSS/BLUE SHIELD | Attending: Physical Medicine & Rehabilitation | Admitting: Physical Medicine & Rehabilitation

## 2017-06-18 ENCOUNTER — Encounter: Payer: Self-pay | Admitting: Physical Medicine & Rehabilitation

## 2017-06-18 VITALS — BP 152/89 | HR 85 | Resp 14

## 2017-06-18 DIAGNOSIS — I69398 Other sequelae of cerebral infarction: Secondary | ICD-10-CM | POA: Insufficient documentation

## 2017-06-18 DIAGNOSIS — M25561 Pain in right knee: Secondary | ICD-10-CM | POA: Insufficient documentation

## 2017-06-18 DIAGNOSIS — I421 Obstructive hypertrophic cardiomyopathy: Secondary | ICD-10-CM | POA: Diagnosis not present

## 2017-06-18 DIAGNOSIS — H3530 Unspecified macular degeneration: Secondary | ICD-10-CM | POA: Diagnosis not present

## 2017-06-18 DIAGNOSIS — Z825 Family history of asthma and other chronic lower respiratory diseases: Secondary | ICD-10-CM | POA: Diagnosis not present

## 2017-06-18 DIAGNOSIS — Z95810 Presence of automatic (implantable) cardiac defibrillator: Secondary | ICD-10-CM | POA: Diagnosis not present

## 2017-06-18 DIAGNOSIS — I10 Essential (primary) hypertension: Secondary | ICD-10-CM

## 2017-06-18 DIAGNOSIS — I69319 Unspecified symptoms and signs involving cognitive functions following cerebral infarction: Secondary | ICD-10-CM | POA: Diagnosis not present

## 2017-06-18 DIAGNOSIS — I6930 Unspecified sequelae of cerebral infarction: Secondary | ICD-10-CM | POA: Diagnosis present

## 2017-06-18 DIAGNOSIS — M79644 Pain in right finger(s): Secondary | ICD-10-CM | POA: Diagnosis not present

## 2017-06-18 DIAGNOSIS — M25562 Pain in left knee: Secondary | ICD-10-CM

## 2017-06-18 DIAGNOSIS — Z8674 Personal history of sudden cardiac arrest: Secondary | ICD-10-CM | POA: Insufficient documentation

## 2017-06-18 DIAGNOSIS — G8929 Other chronic pain: Secondary | ICD-10-CM

## 2017-06-18 DIAGNOSIS — F319 Bipolar disorder, unspecified: Secondary | ICD-10-CM | POA: Diagnosis not present

## 2017-06-18 DIAGNOSIS — I69919 Unspecified symptoms and signs involving cognitive functions following unspecified cerebrovascular disease: Secondary | ICD-10-CM | POA: Diagnosis not present

## 2017-06-18 DIAGNOSIS — I2510 Atherosclerotic heart disease of native coronary artery without angina pectoris: Secondary | ICD-10-CM | POA: Insufficient documentation

## 2017-06-18 DIAGNOSIS — M62838 Other muscle spasm: Secondary | ICD-10-CM | POA: Diagnosis not present

## 2017-06-18 DIAGNOSIS — R269 Unspecified abnormalities of gait and mobility: Secondary | ICD-10-CM | POA: Diagnosis not present

## 2017-06-18 DIAGNOSIS — M1710 Unilateral primary osteoarthritis, unspecified knee: Secondary | ICD-10-CM | POA: Insufficient documentation

## 2017-06-18 MED ORDER — METHOCARBAMOL 500 MG PO TABS: 500.0000 mg | ORAL_TABLET | Freq: Two times a day (BID) | ORAL | 1 refills | Status: DC | PRN

## 2017-06-18 NOTE — Progress Notes (Signed)
Subjective:    Patient ID: Marie Ramos, female    DOB: 01-21-1956, 61 y.o.   MRN: [id number]  HPI  Marie Ramos is a 61 year old right- handed female with history of CAD, bipolar disorder who presents for follow up for embolic watershed stroke with mild anoxic brain injury. She had resulting gait abnormalities and congitive deficits.   Last clinic visit 01/23/17.  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 Her BP is elevated. She ran out of Robaxin. She lost some weight.  Today, pt notes pain in her knees and right thumb pain. She states she had Synvisc 3 injections, but would like a single dose.    Pain Inventory Average Pain 10 Pain Right Now 8 My pain is intermittent and aching  In the last 24 hours, has pain interfered with the following? General activity 0 Relation with others 0 Enjoyment of life 1 What TIME of day is your pain at its worst? night Sleep (in general) Good  Pain is worse with: some activites Pain improves with: rest and medication Relief from Meds: 8  Mobility how many minutes can you walk? 15-20 ability to climb steps?  yes do you drive?  yes Do you have any goals in this area?  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  no  Physicians involved in your care Any changes since last visit?  no   Family History  Problem Relation Age of Onset  . Cancer Mother   . COPD Father    Social History   Socioeconomic History  . Marital status: Married    Spouse name: None  . Number of children: None  . Years of education: None  . Highest education level: None  Social Needs  . Financial resource strain: None  . Food insecurity - worry: None  . Food insecurity - inability: None  . Transportation needs - medical: None  . Transportation needs - non-medical: None  Occupational  History  . None  Tobacco Use  . Smoking status: Never Smoker  . Smokeless tobacco: Never Used  Substance and Sexual Activity  . Alcohol use: No  . Drug use: No  . Sexual activity: None  Other Topics Concern  . None  Social History Narrative  . None   Past Surgical History:  Procedure Laterality Date  . ABDOMINAL HYSTERECTOMY    . CARDIAC CATHETERIZATION N/A 08/24/2015   Procedure: Left Heart Cath and Coronary Angiography;  Surgeon: Sherren Mocha, MD;  Location: [place] [facility];  Service: Cardiovascular;  Laterality: N/A;  . EP IMPLANTABLE DEVICE N/A 09/03/2015   MDT dual chamber ICD  . SPLENECTOMY, TOTAL  2011  . TONSILLECTOMY     Past Medical History:  Diagnosis Date  . Bipolar 1 disorder ([place])   . Cardiac arrest ([place]) 08/24/2015  . Chronically dry eyes   . Hypertrophic obstructive cardiomyopathy ([place]) 08/29/2015  . Macular degeneration   . QT prolongation 08/29/2015  . Stroke ([place])   . TBI (traumatic brain injury) [facility]) 2011   Inpatient rehab [facility]  . Ventricular fibrillation ([place]) 09/03/15   MDT ICD Dr. Rayann Heman   BP (!) 177/80 (BP Location: Left Wrist, Patient Position: Sitting, Cuff Size: Normal)   Pulse 85   Resp 14   SpO2 93%   Opioid Risk Score:   Fall Risk Score:  `  1  Depression screen PHQ 2/9  Depression screen PHQ 2/9 09/26/2015  Decreased Interest 0  Down, Depressed, Hopeless 0  PHQ - 2 Score 0  Altered sleeping 3  Tired, decreased energy 1  Change in appetite 0  Feeling bad or failure about yourself  0  Trouble concentrating 2  Moving slowly or fidgety/restless 0  Suicidal thoughts 0  PHQ-9 Score 6  Difficult doing work/chores Not difficult at all   Review of Systems  Constitutional: Negative.   HENT: Negative.   Eyes: Negative.   Respiratory: Positive for shortness of breath.   Cardiovascular: Negative.   Gastrointestinal: Negative.   Endocrine: Negative.   Genitourinary: Negative.   Musculoskeletal: Positive for arthralgias, back pain  and myalgias.  Skin: Negative.   Allergic/Immunologic: Negative.   Neurological: Negative for dizziness, speech difficulty and weakness.  Hematological: Bruises/bleeds easily.  All other systems reviewed and are negative.     Objective:   Physical Exam  Constitutional: She appears well-developed and well-nourished. NAD. Vital signs reviewed.  HENT: Normocephalic and atraumatic.  Eyes: EOM are normal. No discharge. Cardiovascular: RRR. No JVD. Respiratory: Effort normal and breath sounds normal.  GI: Soft. Bowel sounds are normal.   Musculoskeletal: She exhibits no edema or tenderness.  Neurological: She is alert and oriented.  Able to follow simple commands.  Motor:  RUE: 5/5 proximal to distal LUE: 5/5 proximal to distal LLE: Hip flexion 5/5, knee extension 5/5, ankle dorsi/plantarflexion 5/5  RLE: Hip flexion 5/5, knee extension 5/5, ankle dorsi/plantarflexion 5/5  Skin: Warm and dry. Psychiatric: Verbose, otherwise, relatively normal mood and affect.     Assessment & Plan:  Marie Ramos is a 61 year old right- handed female with history of CAD, bipolar disorder who presents for follow up for embolic watershed stroke with mild anoxic brain injury.   1.  Late effects emolic watershed stroke with mild anoxic brain injury  Cont meds  Cont HEP  Encouraged aquatic therapy   Released from Neurology   2. Hypertension.  Cont meds  Elevated today.  Pt to follow up with PCP.  Will repeat BP  3. Muscle spasms  Robaxin 500mg  BID PRN refilled  4. Morbid obesity  Encouraged weight loss again  5. Bilateral knee pain  Pt states she was receiving injections from Ortho, would like injection  Will request films from [facility]   6. Right thumb pain  Will obtain xrays

## 2017-06-18 NOTE — Patient Instructions (Signed)
Please obtain xrays of bilateral knees and right thumb from Orthopedics and call our office

## 2017-06-20 ENCOUNTER — Other Ambulatory Visit: Payer: Self-pay | Admitting: Internal Medicine

## 2017-06-22 ENCOUNTER — Telehealth: Payer: Self-pay | Admitting: Physical Medicine & Rehabilitation

## 2017-06-22 ENCOUNTER — Encounter: Payer: BLUE CROSS/BLUE SHIELD | Admitting: Internal Medicine

## 2017-06-22 NOTE — Telephone Encounter (Signed)
Pt stated that Dr. Posey Pronto was trying to obtain copies of her x-rays. She stated [facility] is where she had her x-rays taken. She is scheduled for an injection on 12/20. She just wanted to update the information. Please advise.

## 2017-06-23 NOTE — Telephone Encounter (Signed)
Contacted [facility].  They will be sending clinic notes that involve the x-ray images of patient's thumb and bilateral knees.  They do not have X-ray reports per se.  They offered to burn a disc with images. I said yes.  They said the patient will need to pick those up. Side Note: reports were sent and received.  Placed on Dr. Ena Dawley desk. Contacted patient and advised patient to contact [facility] to obtain images.......FYI

## 2017-06-23 NOTE — Telephone Encounter (Signed)
Thank you :)

## 2017-06-25 ENCOUNTER — Encounter: Payer: Self-pay | Admitting: Physical Medicine & Rehabilitation

## 2017-06-25 ENCOUNTER — Encounter: Payer: BLUE CROSS/BLUE SHIELD | Admitting: Physical Medicine & Rehabilitation

## 2017-06-25 VITALS — BP 163/98 | HR 84

## 2017-06-25 DIAGNOSIS — M170 Bilateral primary osteoarthritis of knee: Secondary | ICD-10-CM

## 2017-06-25 DIAGNOSIS — I69319 Unspecified symptoms and signs involving cognitive functions following cerebral infarction: Secondary | ICD-10-CM | POA: Diagnosis not present

## 2017-06-25 NOTE — Progress Notes (Signed)
Bilateral knee injection  Indication: Knee OA not relieved by medication management and other conservative care.  Informed consent was obtained after describing risks and benefits of the procedure with the patient, this includes bleeding, bruising, infection and medication side effects. The patient wishes to proceed and has given written consent. Patient was placed in a seated position. Bilateral knees were marked and prepped with alcohol and betadine in the anteromedial knee. Vapocoolant was sprayed.  A 21 gauge 2 inch needle was inserted into the anteromedial knee. Then 6cc of Synvisc One solution was injected. A band aid was applied. The patient tolerated the procedure well. PROM was performed to the knee. Post procedure instructions were given to refrain from excessive activity to the knee for 48 hours.

## 2017-06-27 IMAGING — CR DG CHEST 1V PORT
1 series · 1 of 1 positions shown · non-contrast
Comparison: None

CLINICAL DATA: 60-year-old female status post cardiac arrest and
intubation

EXAM:
PORTABLE CHEST 1 VIEW

[AP]
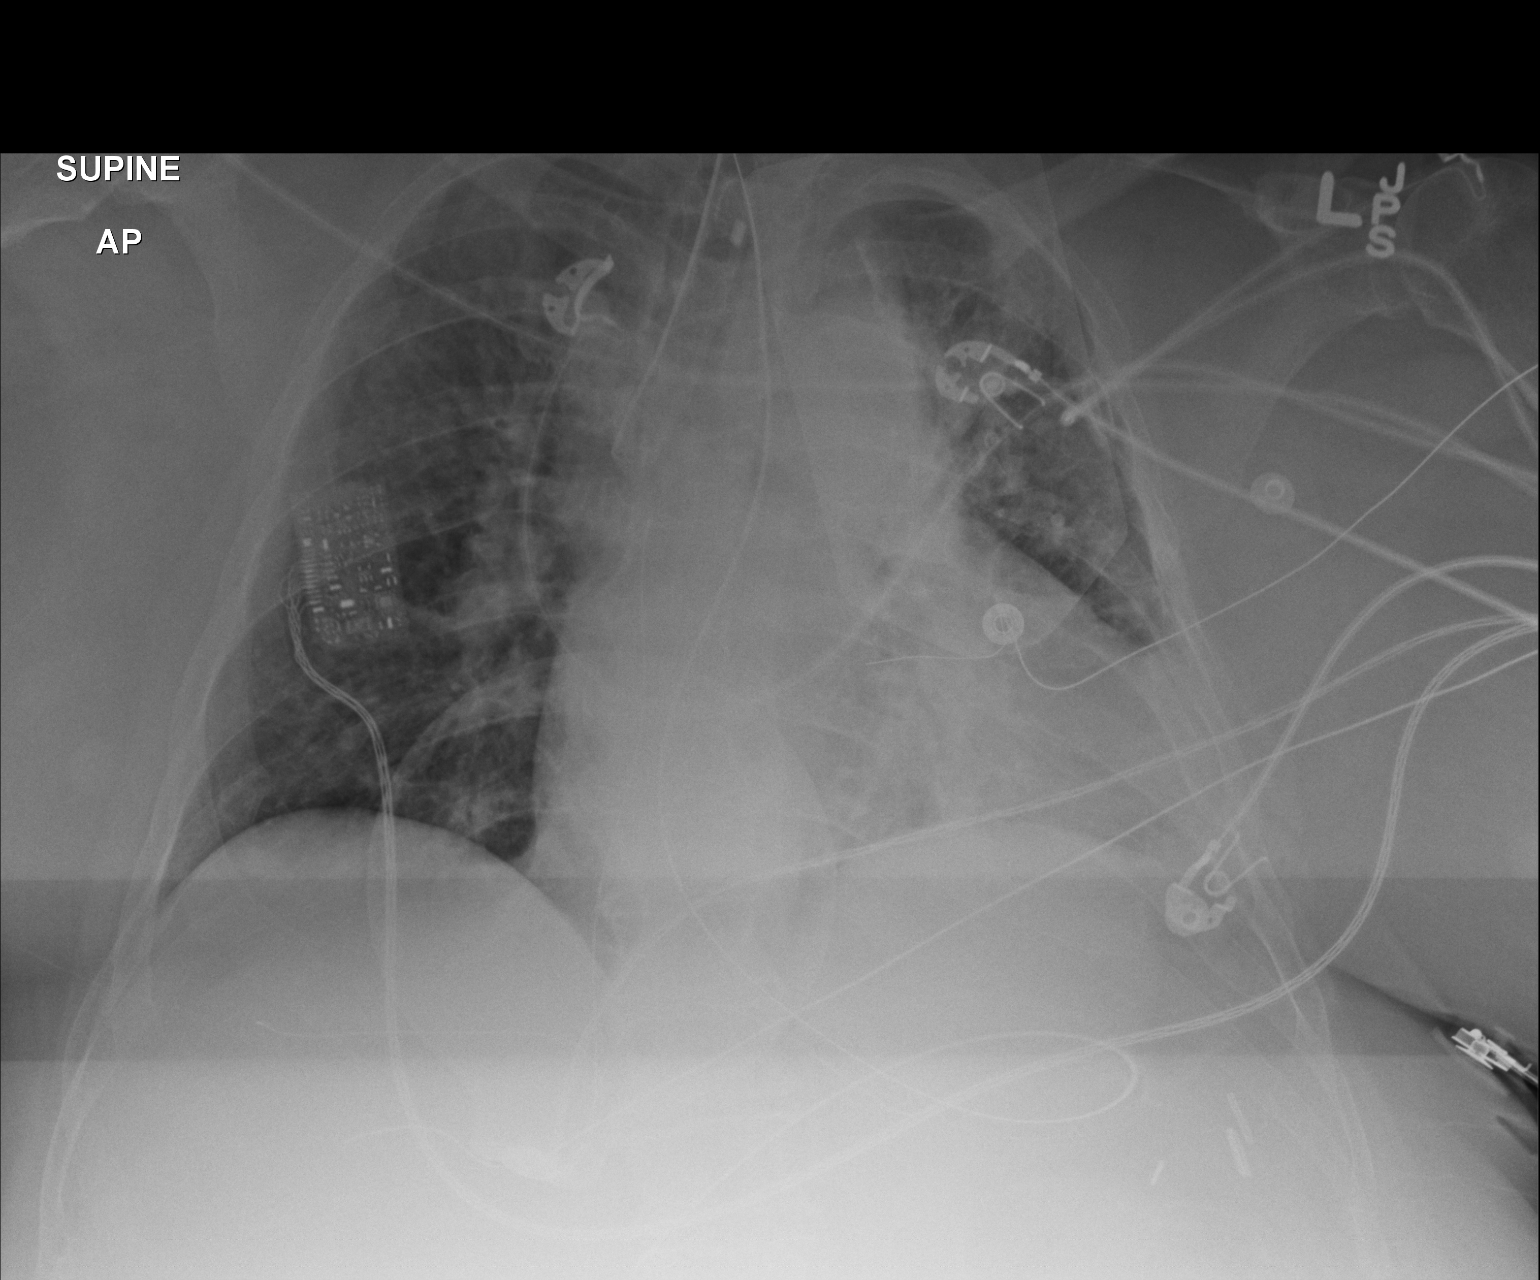

[1 of 1 positions shown; findings below may reference images not displayed]

FINDINGS: Right mainstem intubation. An orogastric tube is present and is
coiled within the stomach. External defibrillator pads project over
the chest. Mild cardiomegaly. Pulmonary vascular congestion with
mild edema. Probable bibasilar atelectasis. No pneumothorax or large
pleural effusion. Slight irregularity of the lateral aspect of
several left-sided ribs. Nondisplaced rib fractures not excluded.
IMPRESSION: 1. Right mainstem intubation. Recommend withdrawing the endotracheal
tube 4.5 cm for more optimal placement above the carina.
2. Cardiomegaly and mild pulmonary vascular congestion suggests CHF.
3. Well-positioned gastric tube.
4. Subtle irregularities the lateral aspect of several left-sided
ribs. Nondisplaced fractures are not excluded.
These results were called by telephone at the time of interpretation
on 08/24/2015 at [DATE] to Dr. MACIEK FERRETIZ , who verbally
acknowledged these results.

## 2017-06-27 IMAGING — CT CT HEAD W/O CM
3 series · 17 of 30 positions shown, 19 images · non-contrast
Comparison: None

CLINICAL DATA: Altered mental status.  Patient is intubated.

EXAM:
CT HEAD WITHOUT CONTRAST
TECHNIQUE: Contiguous axial images were obtained from the base of the skull
through the vertex without contrast.

[Series 3: head without · axial · non-contrast · 0.44mm/px · z∈[-149,-39]mm · 5 of 34 slices shown, 7 images]
[im 6/34  brain]
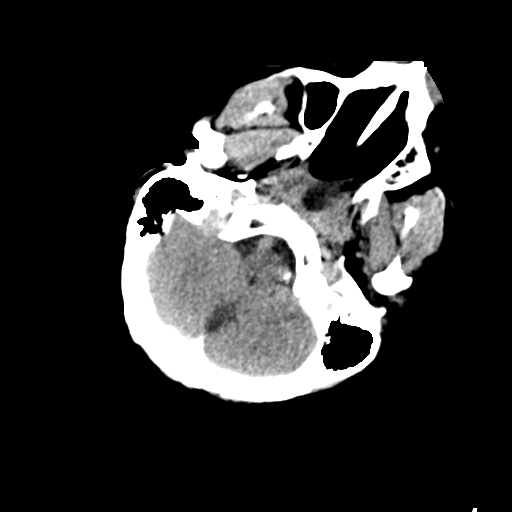
[im 6/34  bone]
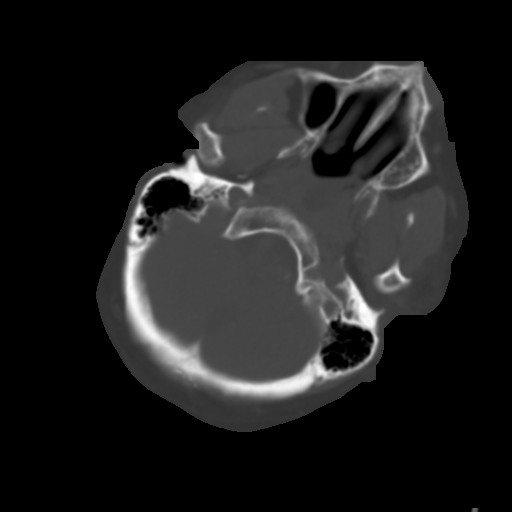
[im 12/34  brain]
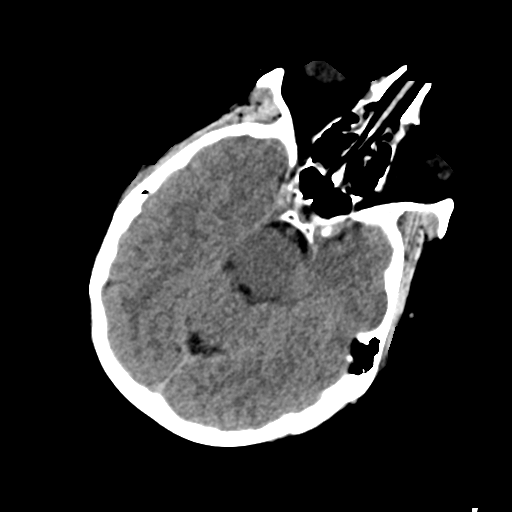
[im 17/34  brain]
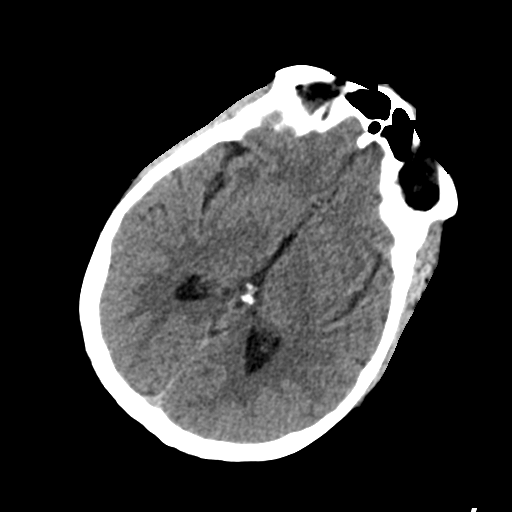
[im 23/34  brain]
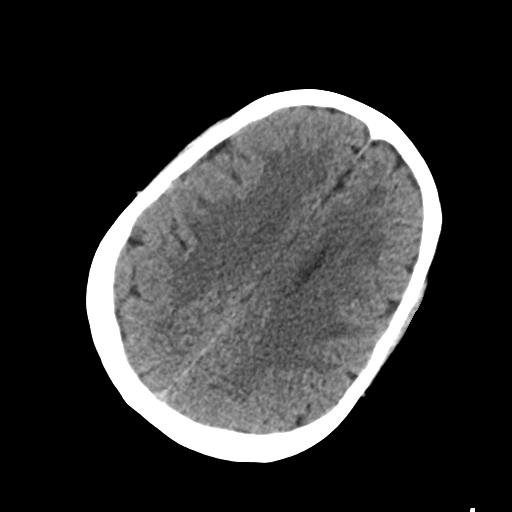
[im 28/34  brain]
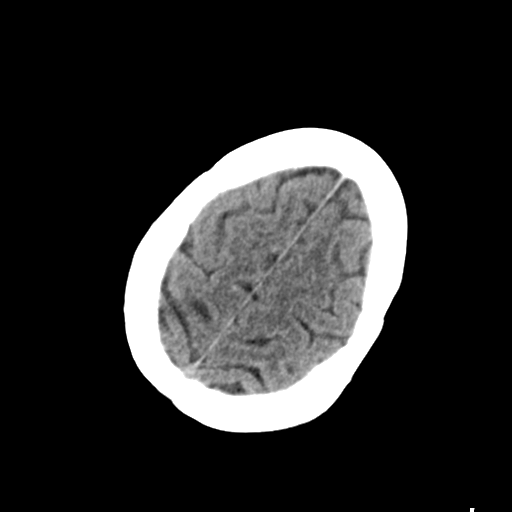
[im 28/34  bone]
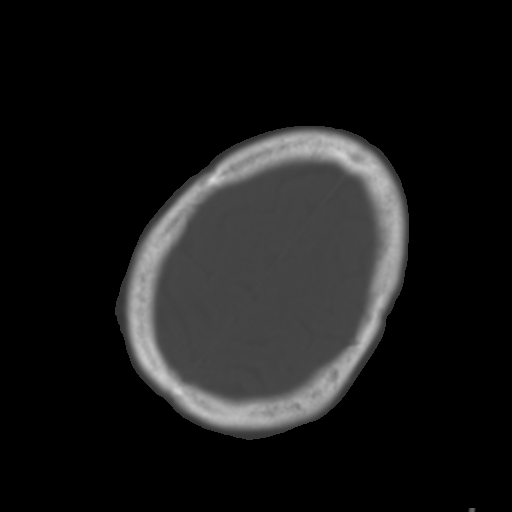

[Series 4: head bone · axial · 0.44mm/px · z∈[-164,-18]mm · 8 of 85 slices shown]
[im 6/85  bone]
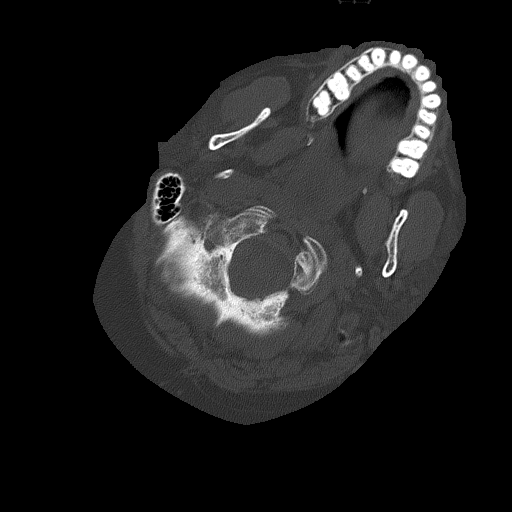
[im 16/85  bone]
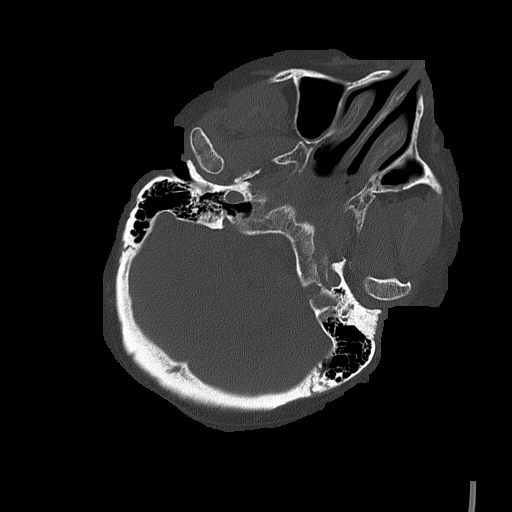
[im 27/85  bone]
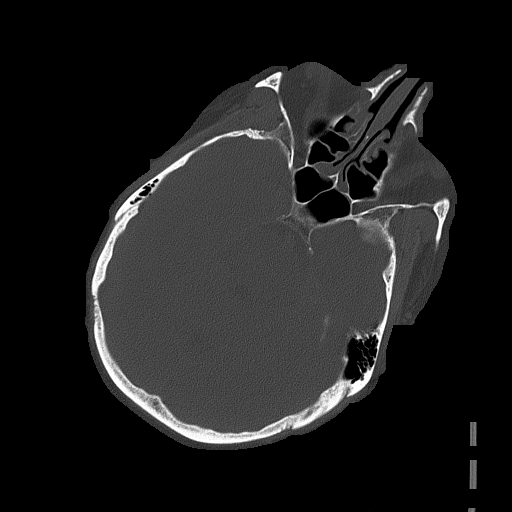
[im 37/85  bone]
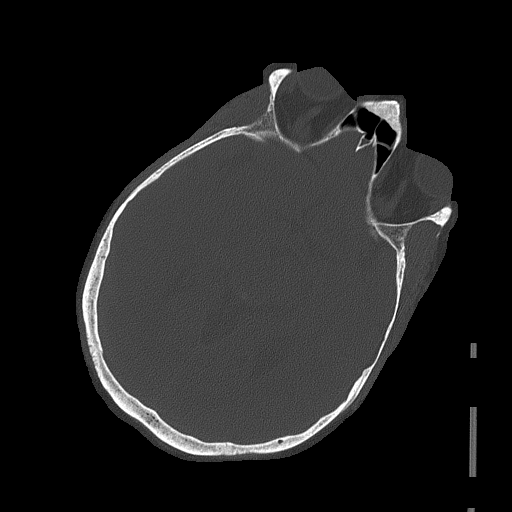
[im 48/85  bone]
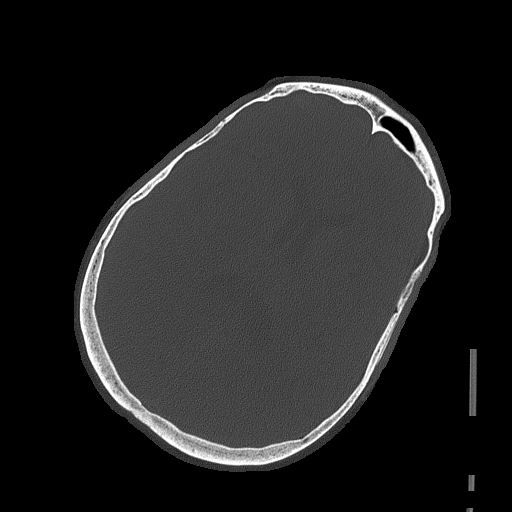
[im 58/85  bone]
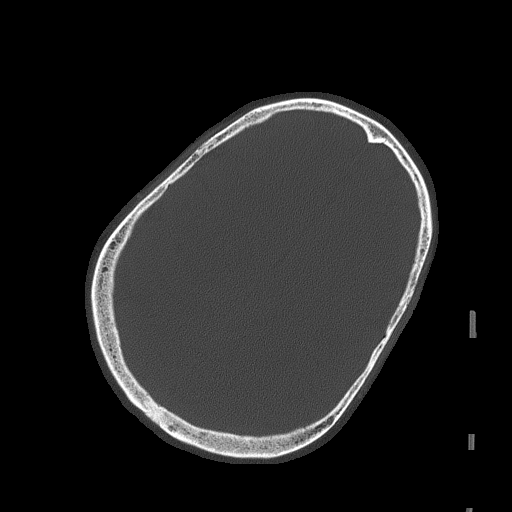
[im 69/85  bone]
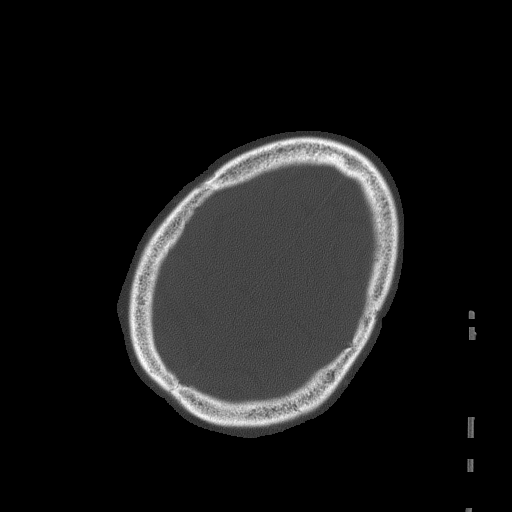
[im 79/85  bone]
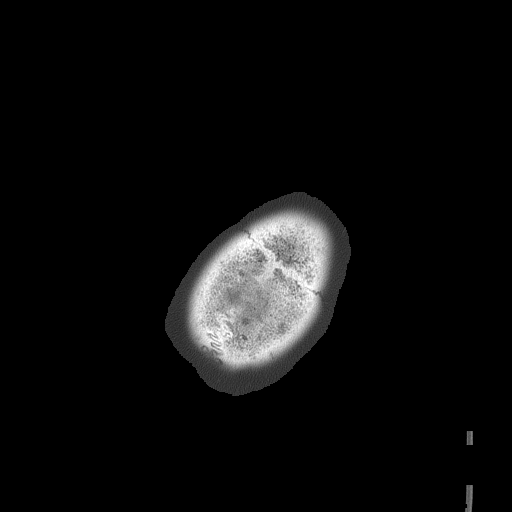

[Series 602: axial · axial · 0.49mm/px · z∈[-84,-5]mm · 4 of 32 slices shown]
[im 7/32  brain]
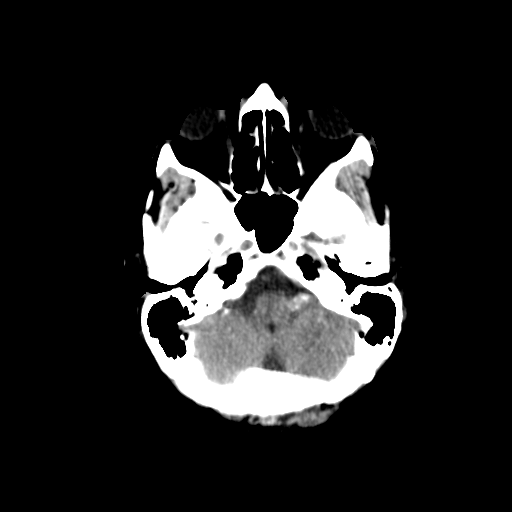
[im 13/32  brain]
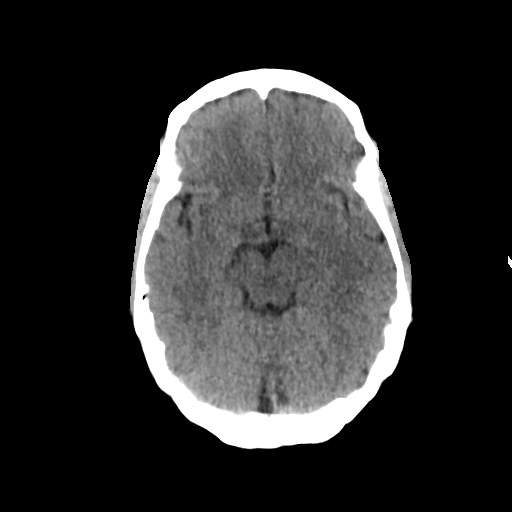
[im 19/32  brain]
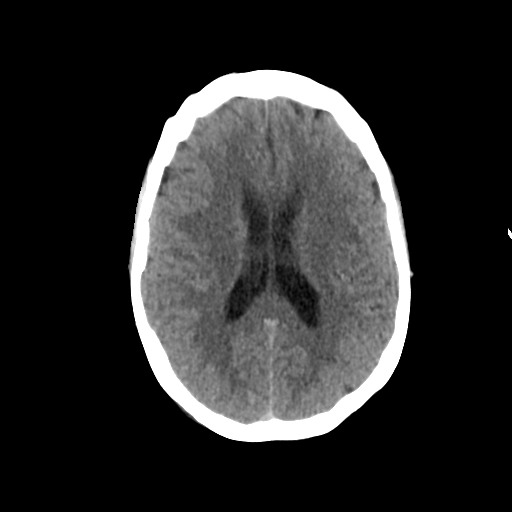
[im 25/32  brain]
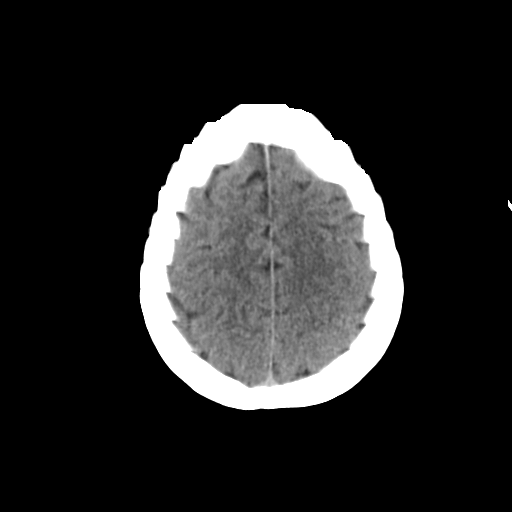

[17 of 30 positions shown; findings below may reference images not displayed]

FINDINGS: No evidence for acute hemorrhage, mass lesion, midline shift,
hydrocephalus or large infarct. Mucosal thickening in the ethmoid
air cells. No calvarial fracture.
IMPRESSION: No acute intracranial abnormality.

## 2017-07-23 ENCOUNTER — Ambulatory Visit (INDEPENDENT_AMBULATORY_CARE_PROVIDER_SITE_OTHER): Payer: BLUE CROSS/BLUE SHIELD | Admitting: *Deleted

## 2017-07-23 DIAGNOSIS — I5023 Acute on chronic systolic (congestive) heart failure: Secondary | ICD-10-CM

## 2017-07-23 DIAGNOSIS — Z95810 Presence of automatic (implantable) cardiac defibrillator: Secondary | ICD-10-CM | POA: Diagnosis not present

## 2017-07-23 DIAGNOSIS — I429 Cardiomyopathy, unspecified: Secondary | ICD-10-CM | POA: Diagnosis not present

## 2017-07-23 NOTE — Progress Notes (Signed)
Remote ICD transmission.   

## 2017-07-24 ENCOUNTER — Encounter: Payer: Self-pay | Admitting: Physical Medicine & Rehabilitation

## 2017-07-24 ENCOUNTER — Encounter: Payer: BLUE CROSS/BLUE SHIELD | Admitting: Physical Medicine & Rehabilitation

## 2017-07-24 ENCOUNTER — Other Ambulatory Visit: Payer: Self-pay

## 2017-07-24 ENCOUNTER — Encounter
Payer: BLUE CROSS/BLUE SHIELD | Attending: Physical Medicine & Rehabilitation | Admitting: Physical Medicine & Rehabilitation

## 2017-07-24 ENCOUNTER — Encounter: Payer: Self-pay | Admitting: Cardiology

## 2017-07-24 VITALS — BP 170/90 | HR 80

## 2017-07-24 DIAGNOSIS — I69398 Other sequelae of cerebral infarction: Secondary | ICD-10-CM | POA: Insufficient documentation

## 2017-07-24 DIAGNOSIS — M25561 Pain in right knee: Secondary | ICD-10-CM | POA: Diagnosis not present

## 2017-07-24 DIAGNOSIS — M25562 Pain in left knee: Secondary | ICD-10-CM | POA: Diagnosis not present

## 2017-07-24 DIAGNOSIS — I69919 Unspecified symptoms and signs involving cognitive functions following unspecified cerebrovascular disease: Secondary | ICD-10-CM | POA: Diagnosis not present

## 2017-07-24 DIAGNOSIS — I10 Essential (primary) hypertension: Secondary | ICD-10-CM

## 2017-07-24 DIAGNOSIS — G8929 Other chronic pain: Secondary | ICD-10-CM | POA: Diagnosis not present

## 2017-07-24 DIAGNOSIS — Z8674 Personal history of sudden cardiac arrest: Secondary | ICD-10-CM | POA: Insufficient documentation

## 2017-07-24 DIAGNOSIS — Z825 Family history of asthma and other chronic lower respiratory diseases: Secondary | ICD-10-CM | POA: Diagnosis not present

## 2017-07-24 DIAGNOSIS — R269 Unspecified abnormalities of gait and mobility: Secondary | ICD-10-CM | POA: Diagnosis not present

## 2017-07-24 DIAGNOSIS — M1710 Unilateral primary osteoarthritis, unspecified knee: Secondary | ICD-10-CM | POA: Insufficient documentation

## 2017-07-24 DIAGNOSIS — M79644 Pain in right finger(s): Secondary | ICD-10-CM | POA: Diagnosis not present

## 2017-07-24 DIAGNOSIS — I421 Obstructive hypertrophic cardiomyopathy: Secondary | ICD-10-CM | POA: Insufficient documentation

## 2017-07-24 DIAGNOSIS — Z95810 Presence of automatic (implantable) cardiac defibrillator: Secondary | ICD-10-CM | POA: Diagnosis not present

## 2017-07-24 DIAGNOSIS — F319 Bipolar disorder, unspecified: Secondary | ICD-10-CM | POA: Insufficient documentation

## 2017-07-24 DIAGNOSIS — M170 Bilateral primary osteoarthritis of knee: Secondary | ICD-10-CM

## 2017-07-24 DIAGNOSIS — I2510 Atherosclerotic heart disease of native coronary artery without angina pectoris: Secondary | ICD-10-CM | POA: Diagnosis not present

## 2017-07-24 DIAGNOSIS — H3530 Unspecified macular degeneration: Secondary | ICD-10-CM | POA: Diagnosis not present

## 2017-07-24 DIAGNOSIS — I6930 Unspecified sequelae of cerebral infarction: Secondary | ICD-10-CM | POA: Diagnosis present

## 2017-07-24 DIAGNOSIS — M62838 Other muscle spasm: Secondary | ICD-10-CM | POA: Diagnosis not present

## 2017-07-24 DIAGNOSIS — I69319 Unspecified symptoms and signs involving cognitive functions following cerebral infarction: Secondary | ICD-10-CM | POA: Insufficient documentation

## 2017-07-24 NOTE — Progress Notes (Signed)
EPIC Encounter for ICM Monitoring  Patient Name: Marie Ramos is a 62 y.o. female Date: 07/24/2017 Primary Care Physican: Lawerance Cruel, MD Primary Cardiologist: Allred Electrophysiologist: Allred Dry Weight:unknown       Heart Failure questions reviewed, pt asymptomatic.   Thoracic impedance abnormal suggesting fluid accumulation.  Prescribed dosage: Furosemide 20 mg 1 tablet daily  Labs: 01/09/2017 Creatinine 0.88, BUN 24, Potassium 4.5, Sodium 145, EGFR 71-82  Recommendations:  Advised to take Furosemide 20 mg 1 tablet twice a day x 2 days and then return to prescribed dosage of 1 tablet daily.  Advised Dr Rayann Heman will check fluid levels at 1/21 office appointment  Follow-up plan: ICM clinic phone appointment on 08/27/2017.  Office appointment scheduled 07/27/2017 with Dr. Rayann Heman.  Copy of ICM check sent to Dr. Rayann Heman.   3 month ICM trend: 07/23/2017    1 Year ICM trend:       [name], RN 07/24/2017 11:20 AM

## 2017-07-24 NOTE — Progress Notes (Signed)
Subjective:    Patient ID: Marie Ramos, female    DOB: 1955/10/17, 62 y.o.   MRN: [id number]  HPI  Marie Ramos is a 62 year old right- handed female with history of CAD, bipolar disorder who presents for follow up for embolic watershed stroke with mild anoxic brain injury. She had resulting gait abnormalities and congitive deficits.   Last clinic visit 06/25/17.  At the time, she had b/l Synvisc injections.  She states she received good benefit with the injections.  She states she still is taking care of her grand daughter and has not gone to pool therapy. She continues HEP. BP remains elevated. She has not lost any significant weight. She continues to have thumb pain.  Pain Inventory Average Pain 9 Pain Right Now 9 My pain is stabbing, tingling and aching  In the last 24 hours, has pain interfered with the following? General activity 1 Relation with others 1 Enjoyment of life 1 What TIME of day is your pain at its worst? all Sleep (in general) Good  Pain is worse with: some activites and . Pain improves with: . Relief from Meds: 0  Mobility how many minutes can you walk? 15-20 ability to climb steps?  yes do you drive?  yes Do you have any goals in this area?  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  Neuro/Psych No problems in this area  Prior Studies Any changes since last visit?  no  Physicians involved in your care Any changes since last visit?  no   Family History  Problem Relation Age of Onset  . Cancer Mother   . COPD Father    Social History   Socioeconomic History  . Marital status: Married    Spouse name: None  . Number of children: None  . Years of education: None  . Highest education level: None  Social Needs  . Financial resource strain: None  . Food insecurity - worry: None  . Food insecurity - inability: None  . Transportation needs - medical: None  . Transportation needs - non-medical:  None  Occupational History  . None  Tobacco Use  . Smoking status: Never Smoker  . Smokeless tobacco: Never Used  Substance and Sexual Activity  . Alcohol use: No  . Drug use: No  . Sexual activity: None  Other Topics Concern  . None  Social History Narrative  . None   Past Surgical History:  Procedure Laterality Date  . ABDOMINAL HYSTERECTOMY    . CARDIAC CATHETERIZATION N/A 08/24/2015   Procedure: Left Heart Cath and Coronary Angiography;  Surgeon: Sherren Mocha, MD;  Location: [place] [facility];  Service: Cardiovascular;  Laterality: N/A;  . EP IMPLANTABLE DEVICE N/A 09/03/2015   MDT dual chamber ICD  . SPLENECTOMY, TOTAL  2011  . TONSILLECTOMY     Past Medical History:  Diagnosis Date  . Bipolar 1 disorder ([place])   . Cardiac arrest ([place]) 08/24/2015  . Chronically dry eyes   . Hypertrophic obstructive cardiomyopathy ([place]) 08/29/2015  . Macular degeneration   . QT prolongation 08/29/2015  . Stroke ([place])   . TBI (traumatic brain injury) [facility]) 2011   Inpatient rehab [facility]  . Ventricular fibrillation ([place]) 09/03/15   MDT ICD Dr. Rayann Heman   BP (!) 170/90   Pulse 80   SpO2 93%   Opioid Risk Score:   Fall Risk Score:  `1  Depression screen PHQ 2/9  Depression screen [facility] 2/9 07/24/2017  09/26/2015  Decreased Interest 0 0  Down, Depressed, Hopeless 0 0  PHQ - 2 Score 0 0  Altered sleeping - 3  Tired, decreased energy - 1  Change in appetite - 0  Feeling bad or failure about yourself  - 0  Trouble concentrating - 2  Moving slowly or fidgety/restless - 0  Suicidal thoughts - 0  PHQ-9 Score - 6  Difficult doing work/chores - Not difficult at all   Review of Systems  Constitutional: Negative.   HENT: Negative.   Eyes: Negative.   Respiratory: Positive for shortness of breath.   Cardiovascular: Negative.   Gastrointestinal: Negative.   Endocrine: Negative.   Genitourinary: Negative.   Musculoskeletal: Positive for arthralgias, back pain, gait problem and myalgias.    Skin: Negative.   Allergic/Immunologic: Negative.   Neurological: Negative for dizziness, speech difficulty and weakness.  Hematological: Bruises/bleeds easily.  Psychiatric/Behavioral: Negative.   All other systems reviewed and are negative.     Objective:   Physical Exam  Constitutional: She appears well-developed and well-nourished. NAD. Vital signs reviewed.  HENT: Normocephalic and atraumatic.  Eyes: EOM are normal. No discharge. Cardiovascular: RRR. No JVD. Respiratory: Effort normal and breath sounds normal.  GI: Soft. Bowel sounds are normal.   Musculoskeletal: She exhibits no edema or tenderness.  Neurological: She is alert and oriented.  Able to follow simple commands.  Motor:  RUE: 5/5 proximal to distal LUE: 5/5 proximal to distal LLE: 5/5 proximal to distal Skin: Warm and dry. Psychiatric: Verbose, otherwise, relatively normal mood and affect.     Assessment & Plan:  Marie Ramos is a 62 year old right- handed female with history of CAD, bipolar disorder who presents for follow up for embolic watershed stroke with mild anoxic brain injury.   1.  Late effects emolic watershed stroke with mild anoxic brain injury  Cont meds  Cont HEP  Encouraged aquatic therapy again  Released from Neurology   2. Hypertension.  Cont meds  Remains elevated, meds being adjusted by Cards  3. Muscle spasms  Robaxin 500mg  BID PRN refilled  4. Morbid obesity  Encouraged weight loss again  5. Bilateral knee pain  Pt received good benefit with Synvisc injection on 12/20  6. Right thumb pain  Will order xray  Trial Ibu 600 TID PRN  Trial Lidoderm patch

## 2017-07-27 ENCOUNTER — Encounter: Payer: Self-pay | Admitting: Internal Medicine

## 2017-07-27 ENCOUNTER — Ambulatory Visit: Payer: BLUE CROSS/BLUE SHIELD | Admitting: Internal Medicine

## 2017-07-27 VITALS — BP 126/74 | HR 64 | Ht 62.0 in | Wt 272.0 lb

## 2017-07-27 DIAGNOSIS — I110 Hypertensive heart disease with heart failure: Secondary | ICD-10-CM | POA: Diagnosis not present

## 2017-07-27 DIAGNOSIS — R9431 Abnormal electrocardiogram [ECG] [EKG]: Secondary | ICD-10-CM

## 2017-07-27 DIAGNOSIS — I5023 Acute on chronic systolic (congestive) heart failure: Secondary | ICD-10-CM | POA: Diagnosis not present

## 2017-07-27 DIAGNOSIS — I5032 Chronic diastolic (congestive) heart failure: Secondary | ICD-10-CM | POA: Diagnosis not present

## 2017-07-27 NOTE — Progress Notes (Signed)
PCP: Lawerance Cruel, MD   Primary EP: Dr Rayann Heman  Marie Ramos is a 62 y.o. female who presents today for routine electrophysiology followup.  Since last being seen in our clinic, the patient reports doing very well.  Today, she denies symptoms of palpitations, chest pain, shortness of breath,  lower extremity edema, dizziness, presyncope, syncope, or ICD shocks.  The patient is otherwise without complaint today.   Past Medical History:  Diagnosis Date  . Bipolar 1 disorder ([place])   . Cardiac arrest ([place]) 08/24/2015  . Chronically dry eyes   . Hypertrophic obstructive cardiomyopathy ([place]) 08/29/2015  . Macular degeneration   . QT prolongation 08/29/2015  . Stroke ([place])   . TBI (traumatic brain injury) [facility]) 2011   Inpatient rehab [facility]  . Ventricular fibrillation ([place]) 09/03/15   MDT ICD Dr. Rayann Heman   Past Surgical History:  Procedure Laterality Date  . ABDOMINAL HYSTERECTOMY    . CARDIAC CATHETERIZATION N/A 08/24/2015   Procedure: Left Heart Cath and Coronary Angiography;  Surgeon: Sherren Mocha, MD;  Location: [place] [facility];  Service: Cardiovascular;  Laterality: N/A;  . EP IMPLANTABLE DEVICE N/A 09/03/2015   MDT dual chamber ICD  . SPLENECTOMY, TOTAL  2011  . TONSILLECTOMY      ROS- all systems are reviewed and negative except as per HPI above  Current Outpatient Medications  Medication Sig Dispense Refill  . ALPRAZolam (XANAX) 0.25 MG tablet Take 1 tablet (0.25 mg total) by mouth at bedtime as needed for anxiety or sleep. 30 tablet 0  . amLODipine (NORVASC) 5 MG tablet Take 1 tablet (5 mg total) by mouth daily with breakfast. 30 tablet 0  . atorvastatin (LIPITOR) 20 MG tablet Take 1 tablet (20 mg total) by mouth daily at 6 PM. 30 tablet 0  . calcipotriene (DOVONOX) 0.005 % cream as directed.   0  . carvedilol (COREG) 12.5 MG tablet TAKE ONE TABLET BY MOUTH TWICE DAILY WITH BREAKFAST AND SUPPER 180 tablet 2  . famotidine (PEPCID) 20 MG tablet Take 20 mg by mouth at  bedtime.    . fexofenadine (ALLEGRA) 180 MG tablet Take 180 mg by mouth daily.    . fluocinonide (LIDEX) 0.05 % external solution APPLY TO AFFECTED AREA TWICE A DAY AS DIRECTED FOR 14 DAYS 14  1  . fluticasone (FLONASE) 50 MCG/ACT nasal spray Place into both nostrils daily. Use as directed    . furosemide (LASIX) 20 MG tablet Take 1 tablet (20 mg total) by mouth daily. 90 tablet 1  . losartan (COZAAR) 100 MG tablet Take 1 tablet (100 mg total) by mouth daily. 90 tablet 3  . methocarbamol (ROBAXIN) 500 MG tablet Take 1 tablet (500 mg total) by mouth 2 (two) times daily as needed for muscle spasms. 60 tablet 1  . multivitamin (PROSIGHT) TABS tablet Take 1 tablet by mouth daily. 30 each 0  . Oxcarbazepine (TRILEPTAL) 300 MG tablet Take 1 tablet (300 mg total) by mouth 2 (two) times daily. 60 tablet 0  . traMADol (ULTRAM) 50 MG tablet Take 1 tablet (50 mg total) by mouth every 12 (twelve) hours as needed for moderate pain. 60 tablet 0  . triamcinolone ointment (KENALOG) 0.5 % as directed.  0  . venlafaxine XR (EFFEXOR-XR) 75 MG 24 hr capsule Take 75 mg by mouth daily.  4  . acetaminophen (TYLENOL) 325 MG tablet Take 1-2 tablets (325-650 mg total) by mouth every 4 (four) hours as needed for mild pain. (Patient not  taking: Reported on 07/27/2017)     No current facility-administered medications for this visit.     Physical Exam: Vitals:   07/27/17 1430  BP: 126/74  Pulse: 64  Weight: 272 lb (123.4 kg)  Height: 5\' 2"  (1.575 m)    GEN- The patient is well appearing, alert and oriented x 3 today.   Head- normocephalic, atraumatic Eyes-  Sclera clear, conjunctiva pink Ears- hearing intact Oropharynx- clear Lungs- Clear to ausculation bilaterally, normal work of breathing Chest- ICD pocket is well healed Heart- Regular rate and rhythm, no murmurs, rubs or gallops, PMI not laterally displaced GI- soft, NT, ND, + BS Extremities- no clubbing, cyanosis, or edema  ICD interrogation- reviewed in  detail today,  See PACEART report Echo 07/24/16 reveals EF 55-60%  ekg 01/09/17 is reviewed personally today  Assessment and Plan:  1.  S/p VF arrest Stable on an appropriate medical regimen Normal ICD function See Pace Art report No changes today Avoid QT prolonging medicines in the future Labs 01/09/17 reviewed Repeat bmet, mg upon return  2. HTN Stable No change required today  3. Prior LLE DVT eliquis stopped by PCP  4. Bipolar disorder Caution with qt prolonging medicines  5. Obesity Body mass index is 49.75 kg/m. Lifestyle modification is encouraged   Carelink Return to see EP PA every 6 months  Thompson Grayer MD, [facility] 07/27/2017 2:55 PM

## 2017-07-27 NOTE — Patient Instructions (Addendum)
Medication Instructions:  Your physician recommends that you continue on your current medications as directed. Please refer to the Current Medication list given to you today.   Labwork: None ordered   Testing/Procedures: None ordered   Follow-Up: Remote monitoring is used to monitor your ICD from home. This monitoring reduces the number of office visits required to check your device to one time per year. It allows [name] to keep an eye on the functioning of your device to ensure it is working properly. You are scheduled for a device check from home on 08/27/17. You may send your transmission at any time that day. If you have a wireless device, the transmission will be sent automatically. After your physician reviews your transmission, you will receive a postcard with your next transmission date.  Your physician wants you to follow-up in: 6 months with Tommye Standard, PA Please call our office to schedule the follow-up appointment.     Any Other Special Instructions Will Be Listed Below (If Applicable).     If you need a refill on your cardiac medications before your next appointment, please call your pharmacy.

## 2017-07-31 LAB — CUP PACEART INCLINIC DEVICE CHECK
Battery Voltage: 3 V
Brady Statistic AP VP Percent: 0 %
Brady Statistic AS VS Percent: 99.92 %
Brady Statistic RA Percent Paced: 0.03 %
Brady Statistic RV Percent Paced: 0.05 %
HIGH POWER IMPEDANCE MEASURED VALUE: 75 Ohm
Implantable Lead Implant Date: 20170227
Implantable Lead Location: 753859
Implantable Lead Model: 5076
Implantable Pulse Generator Implant Date: 20170227
Lead Channel Impedance Value: 399 Ohm
Lead Channel Impedance Value: 418 Ohm
Lead Channel Pacing Threshold Pulse Width: 0.4 ms
Lead Channel Pacing Threshold Pulse Width: 0.4 ms
Lead Channel Sensing Intrinsic Amplitude: 23.875 mV
Lead Channel Setting Pacing Amplitude: 2 V
Lead Channel Setting Pacing Pulse Width: 0.4 ms
Lead Channel Setting Sensing Sensitivity: 0.3 mV
MDC IDC LEAD IMPLANT DT: 20170227
MDC IDC LEAD LOCATION: 753860
MDC IDC MSMT BATTERY REMAINING LONGEVITY: 117 mo
MDC IDC MSMT LEADCHNL RA PACING THRESHOLD AMPLITUDE: 0.75 V
MDC IDC MSMT LEADCHNL RA SENSING INTR AMPL: 3.25 mV
MDC IDC MSMT LEADCHNL RA SENSING INTR AMPL: 3.25 mV
MDC IDC MSMT LEADCHNL RV IMPEDANCE VALUE: 513 Ohm
MDC IDC MSMT LEADCHNL RV PACING THRESHOLD AMPLITUDE: 0.375 V
MDC IDC MSMT LEADCHNL RV SENSING INTR AMPL: 21.625 mV
MDC IDC SESS DTM: 20190121204322
MDC IDC SET LEADCHNL RV PACING AMPLITUDE: 2.5 V
MDC IDC STAT BRADY AP VS PERCENT: 0.03 %
MDC IDC STAT BRADY AS VP PERCENT: 0.04 %

## 2017-08-05 LAB — CUP PACEART REMOTE DEVICE CHECK
Battery Voltage: 3.01 V
Brady Statistic AP VP Percent: 0 %
Brady Statistic AP VS Percent: 0.03 %
Brady Statistic AS VP Percent: 0.04 %
Brady Statistic AS VS Percent: 99.93 %
HighPow Impedance: 80 Ohm
Implantable Lead Implant Date: 20170227
Implantable Lead Implant Date: 20170227
Implantable Lead Location: 753859
Implantable Pulse Generator Implant Date: 20170227
Lead Channel Impedance Value: 399 Ohm
Lead Channel Pacing Threshold Amplitude: 0.375 V
Lead Channel Pacing Threshold Pulse Width: 0.4 ms
Lead Channel Pacing Threshold Pulse Width: 0.4 ms
Lead Channel Sensing Intrinsic Amplitude: 20.5 mV
Lead Channel Sensing Intrinsic Amplitude: 3.5 mV
Lead Channel Setting Pacing Amplitude: 2 V
Lead Channel Setting Pacing Amplitude: 2.5 V
Lead Channel Setting Pacing Pulse Width: 0.4 ms
MDC IDC LEAD LOCATION: 753860
MDC IDC MSMT BATTERY REMAINING LONGEVITY: 117 mo
MDC IDC MSMT LEADCHNL RA PACING THRESHOLD AMPLITUDE: 0.75 V
MDC IDC MSMT LEADCHNL RA SENSING INTR AMPL: 3.5 mV
MDC IDC MSMT LEADCHNL RV IMPEDANCE VALUE: 418 Ohm
MDC IDC MSMT LEADCHNL RV IMPEDANCE VALUE: 513 Ohm
MDC IDC MSMT LEADCHNL RV SENSING INTR AMPL: 20.5 mV
MDC IDC SESS DTM: 20190117083624
MDC IDC SET LEADCHNL RV SENSING SENSITIVITY: 0.3 mV
MDC IDC STAT BRADY RA PERCENT PACED: 0.03 %
MDC IDC STAT BRADY RV PERCENT PACED: 0.04 %

## 2017-08-06 ENCOUNTER — Ambulatory Visit (HOSPITAL_COMMUNITY)
Admission: RE | Admit: 2017-08-06 | Discharge: 2017-08-06 | Disposition: A | Discharge status: Home / Self Care | Payer: BLUE CROSS/BLUE SHIELD | Source: Ambulatory Visit | Attending: Physical Medicine & Rehabilitation | Admitting: Physical Medicine & Rehabilitation

## 2017-08-06 DIAGNOSIS — M19041 Primary osteoarthritis, right hand: Secondary | ICD-10-CM | POA: Diagnosis not present

## 2017-08-06 DIAGNOSIS — G8929 Other chronic pain: Secondary | ICD-10-CM | POA: Insufficient documentation

## 2017-08-06 DIAGNOSIS — M79644 Pain in right finger(s): Secondary | ICD-10-CM | POA: Diagnosis present

## 2017-08-07 ENCOUNTER — Encounter: Payer: BLUE CROSS/BLUE SHIELD | Admitting: Physical Medicine & Rehabilitation

## 2017-08-13 ENCOUNTER — Ambulatory Visit: Payer: BLUE CROSS/BLUE SHIELD | Admitting: Physical Medicine & Rehabilitation

## 2017-08-14 ENCOUNTER — Encounter
Payer: BLUE CROSS/BLUE SHIELD | Attending: Physical Medicine & Rehabilitation | Admitting: Physical Medicine & Rehabilitation

## 2017-08-14 ENCOUNTER — Encounter: Payer: Self-pay | Admitting: Physical Medicine & Rehabilitation

## 2017-08-14 VITALS — BP 134/85 | HR 65

## 2017-08-14 DIAGNOSIS — Z8674 Personal history of sudden cardiac arrest: Secondary | ICD-10-CM | POA: Diagnosis not present

## 2017-08-14 DIAGNOSIS — I69319 Unspecified symptoms and signs involving cognitive functions following cerebral infarction: Secondary | ICD-10-CM | POA: Diagnosis not present

## 2017-08-14 DIAGNOSIS — M25561 Pain in right knee: Secondary | ICD-10-CM | POA: Insufficient documentation

## 2017-08-14 DIAGNOSIS — M25562 Pain in left knee: Secondary | ICD-10-CM | POA: Diagnosis not present

## 2017-08-14 DIAGNOSIS — I2510 Atherosclerotic heart disease of native coronary artery without angina pectoris: Secondary | ICD-10-CM | POA: Insufficient documentation

## 2017-08-14 DIAGNOSIS — Z825 Family history of asthma and other chronic lower respiratory diseases: Secondary | ICD-10-CM | POA: Insufficient documentation

## 2017-08-14 DIAGNOSIS — I421 Obstructive hypertrophic cardiomyopathy: Secondary | ICD-10-CM | POA: Diagnosis not present

## 2017-08-14 DIAGNOSIS — I10 Essential (primary) hypertension: Secondary | ICD-10-CM | POA: Diagnosis not present

## 2017-08-14 DIAGNOSIS — I6930 Unspecified sequelae of cerebral infarction: Secondary | ICD-10-CM | POA: Diagnosis present

## 2017-08-14 DIAGNOSIS — H3530 Unspecified macular degeneration: Secondary | ICD-10-CM | POA: Diagnosis not present

## 2017-08-14 DIAGNOSIS — M1710 Unilateral primary osteoarthritis, unspecified knee: Secondary | ICD-10-CM | POA: Diagnosis not present

## 2017-08-14 DIAGNOSIS — Z95810 Presence of automatic (implantable) cardiac defibrillator: Secondary | ICD-10-CM | POA: Diagnosis not present

## 2017-08-14 DIAGNOSIS — M1811 Unilateral primary osteoarthritis of first carpometacarpal joint, right hand: Secondary | ICD-10-CM | POA: Diagnosis not present

## 2017-08-14 DIAGNOSIS — M79644 Pain in right finger(s): Secondary | ICD-10-CM | POA: Diagnosis not present

## 2017-08-14 DIAGNOSIS — F319 Bipolar disorder, unspecified: Secondary | ICD-10-CM | POA: Insufficient documentation

## 2017-08-14 DIAGNOSIS — I69398 Other sequelae of cerebral infarction: Secondary | ICD-10-CM | POA: Insufficient documentation

## 2017-08-14 DIAGNOSIS — R269 Unspecified abnormalities of gait and mobility: Secondary | ICD-10-CM | POA: Diagnosis not present

## 2017-08-14 DIAGNOSIS — M62838 Other muscle spasm: Secondary | ICD-10-CM | POA: Insufficient documentation

## 2017-08-14 NOTE — Progress Notes (Signed)
Thumb CMC joint injection  Indication: Thumb pain not relieved by medication management and other conservative care.  Informed consent was obtained after describing risks and benefits of the procedure with the patient, this includes bleeding, bruising, infection and medication side effects. The patient wishes to proceed and has given written consent. Patient was placed in a seated position. The right thumb was marked and prepped with betadine in the apex of the anatomic snuff box. Patient applied traction.  Vapocoolant spray was applied. A 25-gauge 1-1/2 inch needle was inserted into the joint space. After negative draw back for blood, a solution containing 0.5 mL of 6 mg per ML celestone and 0.5 mL of 2% lidocaine was injected. A band aid was applied. The patient tolerated the procedure well. Post procedure instructions were given.

## 2017-08-14 NOTE — Progress Notes (Signed)
UDS removed at request of Dr. Posey Pronto, patient no longer being prescribed controlled medication.

## 2017-08-19 ENCOUNTER — Other Ambulatory Visit: Payer: Self-pay | Admitting: Physical Medicine & Rehabilitation

## 2017-08-27 ENCOUNTER — Ambulatory Visit (INDEPENDENT_AMBULATORY_CARE_PROVIDER_SITE_OTHER): Payer: BLUE CROSS/BLUE SHIELD

## 2017-08-27 DIAGNOSIS — I429 Cardiomyopathy, unspecified: Secondary | ICD-10-CM | POA: Diagnosis not present

## 2017-08-27 DIAGNOSIS — Z95810 Presence of automatic (implantable) cardiac defibrillator: Secondary | ICD-10-CM | POA: Diagnosis not present

## 2017-08-28 NOTE — Progress Notes (Signed)
EPIC Encounter for ICM Monitoring  Patient Name: Marie Ramos is a 62 y.o. female Date: 08/28/2017 Primary Care Physican: Lawerance Cruel, MD Primary Cardiologist: Allred Electrophysiologist: Allred Dry Weight:declined to provide weight      Heart Failure questions reviewed, pt asymptomatic.   Thoracic impedance abnormal suggesting fluid accumulation starting 08/25/2017.  Prescribed dosage: Furosemide 20 mg 1 tablet daily  Labs: 01/09/2017 Creatinine 0.88, BUN 24, Potassium 4.5, Sodium 145, EGFR 71-82  Recommendations:   Advised to take Furosemide 20 mg take 2 tablets (40 mg total) x 2 days.    Advised to limit salt intake.  Encouraged to call for fluid symptoms.  Follow-up plan: ICM clinic phone appointment on 09/03/2017.    Copy of ICM check sent to Dr. Rayann Heman.   3 month ICM trend: 08/27/2017    1 Year ICM trend:       [name], RN 08/28/2017 12:17 PM

## 2017-09-03 ENCOUNTER — Ambulatory Visit (INDEPENDENT_AMBULATORY_CARE_PROVIDER_SITE_OTHER): Payer: Self-pay

## 2017-09-03 DIAGNOSIS — I429 Cardiomyopathy, unspecified: Secondary | ICD-10-CM

## 2017-09-03 DIAGNOSIS — Z95810 Presence of automatic (implantable) cardiac defibrillator: Secondary | ICD-10-CM

## 2017-09-03 NOTE — Progress Notes (Signed)
EPIC Encounter for ICM Monitoring  Patient Name: Marie Ramos is a 62 y.o. female Date: 09/03/2017 Primary Care Physican: Ross, Charles Alan, MD Primary Cardiologist: Allred Electrophysiologist: Allred Dry Weight:declined to provide weight       Heart Failure questions reviewed, pt asymptomatic.  Even though patient took extra Furosemide for 2 days she also ate out a lot at restaurants.  She said she likes to eat Mexican food.    Thoracic impedance returned to normal x 2 day after taking extra Furosemide for 2 days.  Impedance started below baseline 08/29/2017 suggesting re-accumulation but trending back toward baseline.  Prescribed dosage: Furosemide 20 mg 1 tablet daily  Labs: 01/09/2017 Creatinine 0.88, BUN 24, Potassium 4.5, Sodium 145, EGFR 71-82  Recommendations:  Advised to take Furosemide 20 mg 2 tablets (40 mg total) every AM x 3 days and to limit salt intake.    Follow-up plan: ICM clinic phone appointment on 09/07/2017 to recheck fluid levels.    Copy of ICM check sent to Dr. Allred.   3 month ICM trend: 09/03/2017    1 Year ICM trend:       Laurie S Short, RN 09/03/2017 10:34 AM   

## 2017-09-04 ENCOUNTER — Telehealth: Payer: Self-pay

## 2017-09-04 NOTE — Telephone Encounter (Signed)
Patient wants to talk to Marie Ramos about fluid restrictions  She cannot remember how many oz she is supposed to have per day  Please call

## 2017-09-04 NOTE — Telephone Encounter (Signed)
Returned call to patient.  Advised general recommendation for fluid restriction is 64 oz a day.  She said she is feeling fine today.  Provided ICM direct number.

## 2017-09-07 ENCOUNTER — Ambulatory Visit (INDEPENDENT_AMBULATORY_CARE_PROVIDER_SITE_OTHER): Payer: Self-pay

## 2017-09-07 DIAGNOSIS — I429 Cardiomyopathy, unspecified: Secondary | ICD-10-CM

## 2017-09-07 DIAGNOSIS — Z95810 Presence of automatic (implantable) cardiac defibrillator: Secondary | ICD-10-CM

## 2017-09-07 NOTE — Progress Notes (Signed)
EPIC Encounter for ICM Monitoring  Patient Name: Marie Ramos is a 62 y.o. female Date: 09/07/2017 Primary Care Physican: Lawerance Cruel, MD Primary Cardiologist: Allred Electrophysiologist: Allred Dry Weight:declined to provide weight      Heart Failure questions reviewed, pt asymptomatic.   Thoracic impedance close to normal after taking extra Furosemide x 3 day  Prescribed dosage: Furosemide 20 mg 1 tablet daily  Labs: 01/09/2017 Creatinine 0.88, BUN 24, Potassium 4.5, Sodium 145, EGFR 71-82  Recommendations: No changes.  Advised to limit salt intake to 2000 mg/day and fluid intake to < 2 liters/day.  Encouraged to call for fluid symptoms.  Follow-up plan: ICM clinic phone appointment on 09/28/2017.    Copy of ICM check sent to Dr. Rayann Heman.   3 month ICM trend: 09/07/2017    1 Year ICM trend:       [name], RN 09/07/2017 10:54 AM

## 2017-09-09 ENCOUNTER — Encounter: Payer: BLUE CROSS/BLUE SHIELD | Admitting: Physical Medicine & Rehabilitation

## 2017-09-11 ENCOUNTER — Other Ambulatory Visit: Payer: Self-pay | Admitting: Internal Medicine

## 2017-09-28 ENCOUNTER — Ambulatory Visit (INDEPENDENT_AMBULATORY_CARE_PROVIDER_SITE_OTHER): Payer: BLUE CROSS/BLUE SHIELD

## 2017-09-28 DIAGNOSIS — I429 Cardiomyopathy, unspecified: Secondary | ICD-10-CM

## 2017-09-28 DIAGNOSIS — Z95810 Presence of automatic (implantable) cardiac defibrillator: Secondary | ICD-10-CM | POA: Diagnosis not present

## 2017-09-28 NOTE — Progress Notes (Signed)
EPIC Encounter for ICM Monitoring  Patient Name: Marie Ramos is a 62 y.o. female Date: 09/28/2017 Primary Care Physican: Lawerance Cruel, MD Primary Cardiologist: Allred Electrophysiologist: Allred Dry Weight:declined to provide weight       Heart Failure questions reviewed, pt reported feeling a little short of breath in the last week and self adjusted Furosemide x 2 days.   Thoracic impedance normal.  Prescribed dosage: Furosemide 20 mg 1 tablet daily  Labs: 01/09/2017 Creatinine 0.88, BUN 24, Potassium 4.5, Sodium 145, EGFR 71-82  Recommendations: No changes.   Encouraged to call for fluid symptoms.  Follow-up plan: ICM clinic phone appointment on 10/29/2017.    Copy of ICM check sent to Dr. Rayann Heman  3 month ICM trend: 09/28/2017    1 Year ICM trend:       [name], RN 09/28/2017 12:41 PM

## 2017-10-21 ENCOUNTER — Other Ambulatory Visit: Payer: Self-pay | Admitting: Physical Medicine & Rehabilitation

## 2017-10-29 ENCOUNTER — Ambulatory Visit (INDEPENDENT_AMBULATORY_CARE_PROVIDER_SITE_OTHER): Payer: BLUE CROSS/BLUE SHIELD | Admitting: *Deleted

## 2017-10-29 DIAGNOSIS — Z95810 Presence of automatic (implantable) cardiac defibrillator: Secondary | ICD-10-CM | POA: Diagnosis not present

## 2017-10-29 DIAGNOSIS — I429 Cardiomyopathy, unspecified: Secondary | ICD-10-CM

## 2017-10-29 NOTE — Progress Notes (Signed)
EPIC Encounter for ICM Monitoring  Patient Name: Marie Ramos is a 62 y.o. female Date: 10/29/2017 Primary Care Physican: Lawerance Cruel, MD Primary Cardiologist: Allred Electrophysiologist: Allred Dry Weight:267 lbs                                                       Heart Failure questions reviewed, pt asymptomatic.   Thoracic impedance normal.  Prescribed dosage: Furosemide 20 mg 1 tablet daily  Labs: 01/09/2017 Creatinine 0.88, BUN 24, Potassium 4.5, Sodium 145, EGFR 71-82  Recommendations: No changes.  Encouraged to call for fluid symptoms.  Follow-up plan: ICM clinic phone appointment on 12/01/2017.    Copy of ICM check sent to Dr. Rayann Heman.   3 month ICM trend: 10/29/2017    1 Year ICM trend:       [name], RN 10/29/2017 3:32 PM

## 2017-10-30 ENCOUNTER — Other Ambulatory Visit: Payer: Self-pay | Admitting: Obstetrics and Gynecology

## 2017-10-30 ENCOUNTER — Encounter: Payer: Self-pay | Admitting: Cardiology

## 2017-10-30 DIAGNOSIS — Z1231 Encounter for screening mammogram for malignant neoplasm of breast: Secondary | ICD-10-CM

## 2017-10-30 NOTE — Progress Notes (Signed)
Remote ICD transmission.   

## 2017-11-20 ENCOUNTER — Ambulatory Visit: Payer: BLUE CROSS/BLUE SHIELD

## 2017-11-20 LAB — CUP PACEART REMOTE DEVICE CHECK
Battery Voltage: 3 V
Brady Statistic AP VP Percent: 0 %
Brady Statistic AP VS Percent: 0.17 %
Brady Statistic AS VP Percent: 0.05 %
Brady Statistic AS VS Percent: 99.78 %
Brady Statistic RV Percent Paced: 0.05 %
HighPow Impedance: 87 Ohm
Implantable Lead Implant Date: 20170227
Implantable Lead Location: 753859
Implantable Lead Location: 753860
Implantable Pulse Generator Implant Date: 20170227
Lead Channel Impedance Value: 399 Ohm
Lead Channel Impedance Value: 399 Ohm
Lead Channel Pacing Threshold Amplitude: 0.375 V
Lead Channel Pacing Threshold Pulse Width: 0.4 ms
Lead Channel Sensing Intrinsic Amplitude: 22.375 mV
Lead Channel Sensing Intrinsic Amplitude: 22.375 mV
Lead Channel Sensing Intrinsic Amplitude: 3.75 mV
Lead Channel Setting Pacing Amplitude: 2 V
Lead Channel Setting Pacing Amplitude: 2.5 V
Lead Channel Setting Sensing Sensitivity: 0.3 mV
MDC IDC LEAD IMPLANT DT: 20170227
MDC IDC MSMT BATTERY REMAINING LONGEVITY: 114 mo
MDC IDC MSMT LEADCHNL RA PACING THRESHOLD AMPLITUDE: 0.625 V
MDC IDC MSMT LEADCHNL RA PACING THRESHOLD PULSEWIDTH: 0.4 ms
MDC IDC MSMT LEADCHNL RA SENSING INTR AMPL: 3.75 mV
MDC IDC MSMT LEADCHNL RV IMPEDANCE VALUE: 532 Ohm
MDC IDC SESS DTM: 20190425041704
MDC IDC SET LEADCHNL RV PACING PULSEWIDTH: 0.4 ms
MDC IDC STAT BRADY RA PERCENT PACED: 0.17 %

## 2017-11-23 ENCOUNTER — Other Ambulatory Visit: Payer: Self-pay | Admitting: Physical Medicine & Rehabilitation

## 2017-11-27 ENCOUNTER — Ambulatory Visit: Payer: BLUE CROSS/BLUE SHIELD

## 2017-12-01 ENCOUNTER — Ambulatory Visit (INDEPENDENT_AMBULATORY_CARE_PROVIDER_SITE_OTHER): Payer: BLUE CROSS/BLUE SHIELD

## 2017-12-01 DIAGNOSIS — I429 Cardiomyopathy, unspecified: Secondary | ICD-10-CM | POA: Diagnosis not present

## 2017-12-01 DIAGNOSIS — Z95810 Presence of automatic (implantable) cardiac defibrillator: Secondary | ICD-10-CM

## 2017-12-01 NOTE — Progress Notes (Signed)
EPIC Encounter for ICM Monitoring  Patient Name: Marie Ramos is a 62 y.o. female Date: 12/01/2017 Primary Care Physican: Lawerance Cruel, MD Primary Cardiologist: Allred Electrophysiologist: Allred Dry Weight:263 lbs       Heart Failure questions reviewed, pt asymptomatic.   Thoracic impedance normal.  Prescribed dosage: Furosemide 20 mg 1 tablet daily  Labs: 01/09/2017 Creatinine 0.88, BUN 24, Potassium 4.5, Sodium 145, EGFR 71-82  Recommendations: No changes.   Encouraged to call for fluid symptoms.  Follow-up plan: ICM clinic phone appointment on 01/01/2018.    Copy of ICM check sent to Dr. Rayann Heman.   3 month ICM trend: 12/01/2017    1 Year ICM trend:       [name], RN 12/01/2017 1:00 PM

## 2017-12-25 ENCOUNTER — Ambulatory Visit: Payer: BLUE CROSS/BLUE SHIELD

## 2018-01-01 ENCOUNTER — Telehealth: Payer: Self-pay

## 2018-01-01 NOTE — Telephone Encounter (Signed)
Spoke with pt and reminded pt of remote transmission that is due today. Pt verbalized understanding.   

## 2018-01-04 ENCOUNTER — Ambulatory Visit (INDEPENDENT_AMBULATORY_CARE_PROVIDER_SITE_OTHER): Payer: BLUE CROSS/BLUE SHIELD

## 2018-01-04 DIAGNOSIS — Z95810 Presence of automatic (implantable) cardiac defibrillator: Secondary | ICD-10-CM | POA: Diagnosis not present

## 2018-01-04 DIAGNOSIS — I5023 Acute on chronic systolic (congestive) heart failure: Secondary | ICD-10-CM

## 2018-01-04 NOTE — Progress Notes (Signed)
EPIC Encounter for ICM Monitoring  Patient Name: Marie Ramos is a 62 y.o. female Date: 01/04/2018 Primary Care Physican: Ross, Charles Alan, MD Primary Cardiologist: Allred Electrophysiologist: Allred Dry Weight:Previous weigth263 lbs  Attempted call to patient and unable to reach.  Left detailed message, per DPR, regarding transmission.  Transmission reviewed.    Thoracic impedance normal.  Prescribed dosage: Furosemide 20 mg 1 tablet daily  Labs: 01/09/2017 Creatinine 0.88, BUN 24, Potassium 4.5, Sodium 145, EGFR 71-82  Recommendations: Left voice mail with ICM number and encouraged to call if experiencing any fluid symptoms.  Follow-up plan: ICM clinic phone appointment on 02/15/2018.  Office appointment scheduled 01/15/2018 with Renee Ursuy, PA.   Copy of ICM check sent to Dr. Allred.   3 month ICM trend: 01/04/2018    1 Year ICM trend:       Laurie S Short, RN 01/04/2018 2:17 PM   

## 2018-01-06 ENCOUNTER — Other Ambulatory Visit: Payer: Self-pay | Admitting: Physical Medicine & Rehabilitation

## 2018-01-13 ENCOUNTER — Ambulatory Visit
Admission: RE | Admit: 2018-01-13 | Discharge: 2018-01-13 | Disposition: A | Discharge status: Home / Self Care | Payer: BLUE CROSS/BLUE SHIELD | Source: Ambulatory Visit | Attending: Family Medicine | Admitting: Family Medicine

## 2018-01-13 DIAGNOSIS — Z1231 Encounter for screening mammogram for malignant neoplasm of breast: Secondary | ICD-10-CM

## 2018-01-15 ENCOUNTER — Ambulatory Visit: Payer: BLUE CROSS/BLUE SHIELD | Admitting: Physician Assistant

## 2018-01-15 VITALS — BP 128/82 | HR 86 | Ht 62.0 in | Wt 265.0 lb

## 2018-01-15 DIAGNOSIS — Z95810 Presence of automatic (implantable) cardiac defibrillator: Secondary | ICD-10-CM | POA: Diagnosis not present

## 2018-01-15 DIAGNOSIS — I5032 Chronic diastolic (congestive) heart failure: Secondary | ICD-10-CM

## 2018-01-15 DIAGNOSIS — I110 Hypertensive heart disease with heart failure: Secondary | ICD-10-CM | POA: Diagnosis not present

## 2018-01-15 NOTE — Progress Notes (Signed)
Patient ID: Marie Ramos, female   DOB: May 04, 1956, 62 y.o.   MRN: [id number]    Cardiology Office Note Date:  01/15/2018  Patient ID:  Marie Ramos [birth year], MRN [id number] PCP:  Lawerance Cruel, MD  Cardiologist:  None prior to hospital, (new) Dr. Burt Knack Electrophysiologist: Dr. Rayann Heman    Chief Complaint: routine EP visit   History of Present Illness: Marie Ramos is a 62 y.o. female with history of cardiac arrest 08/24/15, she was at work, AED was immediately applied and recommended and received 2 shocks and CPR, FR found her in PEA with ROSC after epinephrine.    08/24/15: presumd VF arrest with AED advising shocks, CPR, PEA , she had emergent cardiac cath with no CAD, long QT on her EKG noted. Hypokalemic noted as well.   She underwent hypothermia protocol.  Suspected her psych meds were the etiology pf her QT prolongation.  Echo suggested possible HCM,, MRI revealed hypertensive CM, not HCM.  MRI 2/26 noted CVA, neuro on case, possibly related to cath procedure, not recommended to have TEE or further w/u.  She underwent ICD implant  By Dr. Rayann Heman on 09/04/15.  Psychiatry was consulted and aided in her medications, to avoid QT prolonging meds. Discharged from CIR on the current regime. She developed DVT to LLE started on Eliquis.  She was discharged to CIR until 09/14/15 and has undergone out patient rehab since then.  Is now off a/c  She comes in today being seen for Dr. Rayann Heman, she was last seen by him in Jan 2019, was doing well, no changes were made to her device or therapy, planned for 6 mo f/u with [facility].    She is doing well.  Very active caring for her grandchild leaves her tired at the end of the day and sleeping well.  No CP, palpitations or SOB, no dizziness, near syncope or syncope.  No shocks  Device information: MDT dual chamber ICD implanted 09/03/15, Dr. Rayann Heman, secondary prevention/VF arrest  Past Medical History:  Diagnosis Date  . Bipolar 1 disorder  ([place])   . Cardiac arrest ([place]) 08/24/2015  . Chronically dry eyes   . Hypertrophic obstructive cardiomyopathy ([place]) 08/29/2015  . Macular degeneration   . QT prolongation 08/29/2015  . Stroke ([place])   . TBI (traumatic brain injury) [facility]) 2011   Inpatient rehab [facility]  . Ventricular fibrillation ([place]) 09/03/15   MDT ICD Dr. Rayann Heman    Past Surgical History:  Procedure Laterality Date  . ABDOMINAL HYSTERECTOMY    . CARDIAC CATHETERIZATION N/A 08/24/2015   Procedure: Left Heart Cath and Coronary Angiography;  Surgeon: Sherren Mocha, MD;  Location: [place] CV LAB;  Service: Cardiovascular;  Laterality: N/A;  . EP IMPLANTABLE DEVICE N/A 09/03/2015   MDT dual chamber ICD  . SPLENECTOMY, TOTAL  2011  . TONSILLECTOMY      Current Outpatient Medications  Medication Sig Dispense Refill  . acetaminophen (TYLENOL) 325 MG tablet Take 1-2 tablets (325-650 mg total) by mouth every 4 (four) hours as needed for mild pain.    Marland Kitchen ALPRAZolam (XANAX) 0.25 MG tablet Take 1 tablet (0.25 mg total) by mouth at bedtime as needed for anxiety or sleep. 30 tablet 0  . amLODipine (NORVASC) 5 MG tablet Take 1 tablet (5 mg total) by mouth daily with breakfast. 30 tablet 0  . Apremilast (OTEZLA) 30 MG TABS Take by mouth.    Marland Kitchen atorvastatin (LIPITOR) 20 MG tablet Take 1 tablet (20 mg total) by  mouth daily at 6 PM. 30 tablet 0  . calcipotriene (DOVONOX) 0.005 % cream as directed.   0  . carvedilol (COREG) 12.5 MG tablet TAKE ONE TABLET BY MOUTH TWICE DAILY WITH BREAKFAST AND SUPPER 180 tablet 2  . famotidine (PEPCID) 20 MG tablet Take 20 mg by mouth at bedtime.    . fexofenadine (ALLEGRA) 180 MG tablet Take 180 mg by mouth daily.    . fluocinonide (LIDEX) 0.05 % external solution APPLY TO AFFECTED AREA TWICE A DAY AS DIRECTED FOR 14 DAYS 14  1  . fluticasone (FLONASE) 50 MCG/ACT nasal spray Place into both nostrils daily. Use as directed    . furosemide (LASIX) 20 MG tablet TAKE ONE TABLET BY MOUTH ONE TIME DAILY  90  tablet 2  . losartan (COZAAR) 100 MG tablet Take 1 tablet (100 mg total) by mouth daily. 90 tablet 3  . methocarbamol (ROBAXIN) 500 MG tablet TAKE ONE TABLET BY MOUTH TWICE DAILY as needed for muscle spasms 60 tablet 0  . multivitamin (PROSIGHT) TABS tablet Take 1 tablet by mouth daily. 30 each 0  . Oxcarbazepine (TRILEPTAL) 300 MG tablet Take 1 tablet (300 mg total) by mouth 2 (two) times daily. 60 tablet 0  . traMADol (ULTRAM) 50 MG tablet Take 50 mg by mouth daily as needed for pain.  0  . triamcinolone ointment (KENALOG) 0.5 % as directed.  0  . venlafaxine XR (EFFEXOR-XR) 75 MG 24 hr capsule Take 75 mg by mouth daily.  4   No current facility-administered medications for this visit.     Allergies:   Naproxen and Vicodin [hydrocodone-acetaminophen]   Social History:  The patient  reports that she has never smoked. She has never used smokeless tobacco. She reports that she does not drink alcohol or use drugs.   Family History:  The patient's family history includes COPD in her father; Cancer in her mother.  ROS:  Please see the history of present illness.    All other systems are reviewed and otherwise negative.   PHYSICAL EXAM:  VS:  BP 128/82   Pulse 86   Ht 5\' 2"  (1.575 m)   Wt 265 lb (120.2 kg)   BMI 48.47 kg/m  BMI: Body mass index is 48.47 kg/m. Well nourished, well developed, in no acute distress  HEENT: normocephalic, atraumatic  Neck: no JVD, carotid bruits or masses Cardiac: RRR; no significant murmurs, no rubs, or gallops Lungs:  CTA b/l , no wheezing, rhonchi or rales  Abd: soft, nontender, central obesity MS: no deformity or atrophy Ext:  no edema  Skin: warm and dry, no rash Neuro:  No gross deficits appreciated Psych: euthymic mood, full affect  ICD site is stable,well healed, no tethering or discomfort   EKG:  Done today and reviewed by myself: SR 86bpm, 1st degree AVbock PR 241ms, QRS 97ms, QTc 451ms ICD interrogation today and reviewed by myself.  Battery and lead measruements are good, no AF or VT, 99.8% AS/VS, OptiVol looks good  07/14/16: TTE Study Conclusions - Left ventricle: The cavity size was normal. Wall thickness was   increased in a pattern of mild LVH. Systolic function was normal.   The estimated ejection fraction was in the range of 55% to 60%.   Wall motion was normal; there were no regional wall motion   abnormalities. Doppler parameters are consistent with abnormal   left ventricular relaxation (grade 1 diastolic dysfunction). - Aortic valve: There was no stenosis. - Mitral valve: Mildly calcified  annulus. There was no significant   regurgitation. - Right ventricle: The cavity size was normal. Pacer wire or   catheter noted in right ventricle. Systolic function was normal. - Pulmonary arteries: No complete TR doppler jet so unable to   estimate PA systolic pressure. - Inferior vena cava: The vessel was normal in size. The   respirophasic diameter changes were in the normal range (= 50%),   consistent with normal central venous pressure. Impressions: - Normal LV size with mild LV hypertrophy. EF 55-60%. Normal RV   size and systolic function. No significant valvular   abnormalities.  08/24/15: Echocardiogram:  Study Conclusions - Left ventricle: The cavity size was normal. Systolic function was  mildly to moderately reduced. The estimated ejection fraction was  in the range of 40% to 45%. There is severe asymmetric septal  hypetrophy without dynamic obstruction. Findings suggestive of  hypertrophic nonobstructive cardiomyopathy. Diffuse hypokinesis.  Doppler parameters are consistent with abnormal left ventricular  relaxation (grade 1 diastolic dysfunction). Septal thickness, ED  (2D): 17 mm. Posterior wall thickness, ED (2D): 10.5 mm. - Technically difficult study. Impressions: - Technically difficult study, however appears to have asymmetric  septal hypertrophic cardiomyoapathy without  obstruction. In  setting of VF arrest, would consider cardiac MRI to better  evaluate as this could be the potential etiology of her  arrhythmia.  08/24/15: LHC Conclusion    Widely patent coronary arteries Normal LVEDP       Recent Labs: No results found for requested labs within last 8760 hours.  No results found for requested labs within last 8760 hours.   CrCl cannot be calculated (Patient's most recent lab result is older than the maximum 21 days allowed.).   Wt Readings from Last 3 Encounters:  01/15/18 265 lb (120.2 kg)  07/27/17 272 lb (123.4 kg)  01/09/17 270 lb (122.5 kg)     Other studies reviewed: Additional studies/records reviewed today include: summarized above  DEVICE information:  MDT dual chamber ICD (MRI compatible), implanted 09/03/15, Dr. Rayann Heman  ASSESSMENT AND PLAN:  1. VF arrest, QT prolongation felt secondary to psych meds 2. ICD     meds reviewed with [facility], OK      3. Bipolar     Continue f/u with psych     Avoid QT prolonging medications and caution with antipsychotics in the futre, re-visited with the patient today  4. HTN 5. hypertensive CM, chronic CHF (disatolic)     On BB/ARB tx      Device notes stable OptiVol and good daily patient activity      No symptoms or exam findings to suggest fluid OL weight is down      She will get back on low salt diet, encouraged exercise and weight loss        Disposition: continue Q 3 month remotes, in-clinic EP in 1 year, sooner if needed.    Current medicines are reviewed at length with the patient today.  The patient did not have any concerns regarding medicines.  Haywood Lasso, PA-C 01/15/2018 3:25 PM     [address] [place] [address] [phone] (office)  [phone] (fax)

## 2018-01-15 NOTE — Patient Instructions (Addendum)
  Medication Instructions:   Your physician recommends that you continue on your current medications as directed. Please refer to the Current Medication list given to you today.   If you need a refill on your cardiac medications before your next appointment, please call your pharmacy.  Labwork: NONE ORDERED  TODAY    Testing/Procedures: NONE ORDERED  TODAY    Follow-Up:  Your physician wants you to follow-up in: [place] will receive a reminder letter in the mail two months in advance. If you don't receive a letter, please call our office to schedule the follow-up appointment.   Remote monitoring is used to monitor your Pacemaker of ICD from home. This monitoring reduces the number of office visits required to check your device to one time per year. It allows [name] to keep an eye on the functioning of your device to ensure it is working properly. You are scheduled for a device check from home on .  02-15-18 You may send your transmission at any time that day. If you have a wireless device, the transmission will be sent automatically. After your physician reviews your transmission, you will receive a postcard with your next transmission date.     Any Other Special Instructions Will Be Listed Below (If Applicable).

## 2018-01-25 ENCOUNTER — Ambulatory Visit: Payer: BLUE CROSS/BLUE SHIELD | Admitting: Physician Assistant

## 2018-01-31 ENCOUNTER — Other Ambulatory Visit: Payer: Self-pay | Admitting: Internal Medicine

## 2018-02-15 ENCOUNTER — Telehealth: Payer: Self-pay | Admitting: Cardiology

## 2018-02-15 ENCOUNTER — Ambulatory Visit (INDEPENDENT_AMBULATORY_CARE_PROVIDER_SITE_OTHER): Payer: Medicare HMO

## 2018-02-15 DIAGNOSIS — I5032 Chronic diastolic (congestive) heart failure: Secondary | ICD-10-CM | POA: Diagnosis not present

## 2018-02-15 DIAGNOSIS — Z95810 Presence of automatic (implantable) cardiac defibrillator: Secondary | ICD-10-CM

## 2018-02-15 NOTE — Telephone Encounter (Signed)
Spoke with pt and reminded pt of remote transmission that is due today. Pt verbalized understanding.   

## 2018-02-16 ENCOUNTER — Ambulatory Visit (INDEPENDENT_AMBULATORY_CARE_PROVIDER_SITE_OTHER): Payer: Medicare HMO | Admitting: *Deleted

## 2018-02-16 DIAGNOSIS — I429 Cardiomyopathy, unspecified: Secondary | ICD-10-CM | POA: Diagnosis not present

## 2018-02-17 DIAGNOSIS — I429 Cardiomyopathy, unspecified: Secondary | ICD-10-CM

## 2018-02-17 NOTE — Progress Notes (Signed)
Remote ICD transmission.   

## 2018-02-19 ENCOUNTER — Other Ambulatory Visit: Payer: Self-pay

## 2018-02-19 NOTE — Progress Notes (Signed)
EPIC Encounter for ICM Monitoring  Patient Name: Marie Ramos is a 62 y.o. female Date: 02/19/2018 Primary Care Physican: Lawerance Cruel, MD Primary Cardiologist: Allred Electrophysiologist: Allred Dry Weight: 260lbs      Heart Failure questions reviewed, pt asymptomatic.   Thoracic impedance normal.  Prescribed dosage: Furosemide 20 mg 1 tablet daily  Labs: 01/09/2017 Creatinine 0.88, BUN 24, Potassium 4.5, Sodium 145, EGFR 71-82  Recommendations: No changes.  Encouraged to call for fluid symptoms.  Follow-up plan: ICM clinic phone appointment on 03/18/2018.    Copy of ICM check sent to Dr. Rayann Heman.   3 month ICM trend: 02/16/2018    1 Year ICM trend:       [name], RN 02/19/2018 10:59 AM

## 2018-03-13 LAB — CUP PACEART REMOTE DEVICE CHECK
Brady Statistic AP VP Percent: 0 %
Brady Statistic AP VS Percent: 0.02 %
Brady Statistic AS VP Percent: 0.04 %
Brady Statistic AS VS Percent: 99.93 %
Brady Statistic RV Percent Paced: 0.04 %
Date Time Interrogation Session: 20190813223423
HighPow Impedance: 82 Ohm
Implantable Lead Implant Date: 20170227
Implantable Lead Implant Date: 20170227
Implantable Lead Location: 753860
Lead Channel Impedance Value: 513 Ohm
Lead Channel Pacing Threshold Amplitude: 0.375 V
Lead Channel Pacing Threshold Amplitude: 0.75 V
Lead Channel Pacing Threshold Pulse Width: 0.4 ms
Lead Channel Sensing Intrinsic Amplitude: 19.25 mV
Lead Channel Sensing Intrinsic Amplitude: 3.375 mV
Lead Channel Setting Pacing Amplitude: 2 V
Lead Channel Setting Sensing Sensitivity: 0.3 mV
MDC IDC LEAD LOCATION: 753859
MDC IDC MSMT BATTERY REMAINING LONGEVITY: 111 mo
MDC IDC MSMT BATTERY VOLTAGE: 3 V
MDC IDC MSMT LEADCHNL RA IMPEDANCE VALUE: 361 Ohm
MDC IDC MSMT LEADCHNL RA SENSING INTR AMPL: 3.375 mV
MDC IDC MSMT LEADCHNL RV IMPEDANCE VALUE: 399 Ohm
MDC IDC MSMT LEADCHNL RV PACING THRESHOLD PULSEWIDTH: 0.4 ms
MDC IDC MSMT LEADCHNL RV SENSING INTR AMPL: 19.25 mV
MDC IDC PG IMPLANT DT: 20170227
MDC IDC SET LEADCHNL RV PACING AMPLITUDE: 2.5 V
MDC IDC SET LEADCHNL RV PACING PULSEWIDTH: 0.4 ms
MDC IDC STAT BRADY RA PERCENT PACED: 0.03 %

## 2018-03-18 ENCOUNTER — Ambulatory Visit (INDEPENDENT_AMBULATORY_CARE_PROVIDER_SITE_OTHER): Payer: Medicare HMO

## 2018-03-18 DIAGNOSIS — I5032 Chronic diastolic (congestive) heart failure: Secondary | ICD-10-CM

## 2018-03-18 DIAGNOSIS — Z95810 Presence of automatic (implantable) cardiac defibrillator: Secondary | ICD-10-CM | POA: Diagnosis not present

## 2018-03-19 ENCOUNTER — Telehealth: Payer: Self-pay

## 2018-03-19 NOTE — Telephone Encounter (Signed)
Remote ICM transmission received.  Attempted call to patient and left detailed message, per DPR, regarding transmission and next ICM scheduled for 04/19/2018.  Advised to return call for any fluid symptoms or questions.

## 2018-03-19 NOTE — Progress Notes (Signed)
EPIC Encounter for ICM Monitoring  Patient Name: Marie Ramos is a 62 y.o. female Date: 03/19/2018 Primary Care Physican: Lawerance Cruel, MD Primary Cardiologist: Allred Electrophysiologist: Allred Dry Weight:Previous weight  260lbs           Attempted call to patient and unable to reach.  Left detailed message, per DPR, regarding transmission.  Transmission reviewed.    Thoracic impedance normal.  Prescribed: Furosemide 20 mg 1 tablet daily  Labs: 01/09/2017 Creatinine 0.88, BUN 24, Potassium 4.5, Sodium 145, EGFR 71-82  Recommendations: Left voice mail with ICM number and encouraged to call if experiencing any fluid symptoms.  Follow-up plan: ICM clinic phone appointment on 04/19/2018.    Copy of ICM check sent to Dr. Rayann Heman.   3 month ICM trend: 03/18/2018    1 Year ICM trend:       [name], RN 03/19/2018 9:19 AM

## 2018-04-09 DIAGNOSIS — L400 Psoriasis vulgaris: Secondary | ICD-10-CM | POA: Diagnosis not present

## 2018-04-19 ENCOUNTER — Ambulatory Visit (INDEPENDENT_AMBULATORY_CARE_PROVIDER_SITE_OTHER): Payer: Medicare HMO

## 2018-04-19 DIAGNOSIS — I5032 Chronic diastolic (congestive) heart failure: Secondary | ICD-10-CM | POA: Diagnosis not present

## 2018-04-19 DIAGNOSIS — Z95810 Presence of automatic (implantable) cardiac defibrillator: Secondary | ICD-10-CM

## 2018-04-20 NOTE — Progress Notes (Signed)
EPIC Encounter for ICM Monitoring  Patient Name: Marie Ramos is a 62 y.o. female Date: 04/20/2018 Primary Care Physican: Lawerance Cruel, MD Primary Cardiologist: Allred Electrophysiologist: Allred Dry Weight:Previous weight  260lbs      Heart Failure questions reviewed, pt asymptomatic.   Thoracic impedance normal.   Prescribed:  Furosemide 20 mg 1 tablet daily  Labs: 01/09/2017 Creatinine 0.88, BUN 24, Potassium 4.5, Sodium 145, EGFR 71-82  Recommendations:  No changes.   Encouraged to call for fluid symptoms.  Follow-up plan: ICM clinic phone appointment on 05/20/2018.     Copy of ICM check sent to Dr. Rayann Heman.   3 month ICM trend: 04/19/2018    1 Year ICM trend:       [name], RN 04/20/2018 12:47 PM

## 2018-05-03 DIAGNOSIS — H5213 Myopia, bilateral: Secondary | ICD-10-CM | POA: Diagnosis not present

## 2018-05-20 ENCOUNTER — Ambulatory Visit (INDEPENDENT_AMBULATORY_CARE_PROVIDER_SITE_OTHER): Payer: Medicare HMO

## 2018-05-20 ENCOUNTER — Ambulatory Visit (INDEPENDENT_AMBULATORY_CARE_PROVIDER_SITE_OTHER): Payer: Medicare HMO | Admitting: *Deleted

## 2018-05-20 DIAGNOSIS — I5032 Chronic diastolic (congestive) heart failure: Secondary | ICD-10-CM

## 2018-05-20 DIAGNOSIS — Z95810 Presence of automatic (implantable) cardiac defibrillator: Secondary | ICD-10-CM | POA: Diagnosis not present

## 2018-05-20 DIAGNOSIS — I429 Cardiomyopathy, unspecified: Secondary | ICD-10-CM

## 2018-05-20 NOTE — Progress Notes (Signed)
Remote ICD transmission.   

## 2018-05-21 ENCOUNTER — Telehealth: Payer: Self-pay

## 2018-05-21 NOTE — Progress Notes (Signed)
EPIC Encounter for ICM Monitoring  Patient Name: Marie Ramos is a 62 y.o. female Date: 05/21/2018 Primary Care Physican: Lawerance Cruel, MD Primary Cardiologist: Allred Electrophysiologist: Allred Last Weight: 260lbs  Today's Weight:  unknown      Attempted call to patient and unable to reach.  Left detailed message, per DPR, regarding transmission.  Transmission reviewed.    Thoracic impedance normal but shows pattern of decreased impedance several days alternating with impedance at baseline for a couple of days   Prescribed: Furosemide 20 mg 1 tablet daily  Labs: 01/09/2017 Creatinine 0.88, BUN 24, Potassium 4.5, Sodium 145, EGFR 71-82  Recommendations:  Left voice mail with ICM number and encouraged to call if experiencing any fluid symptoms.  Follow-up plan: ICM clinic phone appointment on 06/21/2018.    Copy of ICM check sent to Dr. Rayann Heman.   3 month ICM trend: 05/20/2018    1 Year ICM trend:       [name], RN 05/21/2018 12:22 PM

## 2018-05-21 NOTE — Progress Notes (Signed)
Patient returned call.  She reported she is doing fine.  Transmission reviewed.  She thinks the pattern with decreased impedance is due to eating in restaurants more in the last couple of months.  She is aware restaurant foods are high in sodium and encouraged to limit salt intake.  She is not having any symptoms at this time.  Next ICM remote transmission 06/21/2018.

## 2018-05-21 NOTE — Telephone Encounter (Signed)
Remote ICM transmission received.  Attempted call to patient regarding ICM remote transmission and left message, per DPR, to return call.    

## 2018-06-08 DIAGNOSIS — H353132 Nonexudative age-related macular degeneration, bilateral, intermediate dry stage: Secondary | ICD-10-CM | POA: Diagnosis not present

## 2018-06-08 DIAGNOSIS — H04123 Dry eye syndrome of bilateral lacrimal glands: Secondary | ICD-10-CM | POA: Diagnosis not present

## 2018-06-16 DIAGNOSIS — M79641 Pain in right hand: Secondary | ICD-10-CM | POA: Diagnosis not present

## 2018-06-21 ENCOUNTER — Ambulatory Visit (INDEPENDENT_AMBULATORY_CARE_PROVIDER_SITE_OTHER): Payer: Medicare HMO

## 2018-06-21 DIAGNOSIS — I5032 Chronic diastolic (congestive) heart failure: Secondary | ICD-10-CM

## 2018-06-21 DIAGNOSIS — Z95810 Presence of automatic (implantable) cardiac defibrillator: Secondary | ICD-10-CM

## 2018-06-21 DIAGNOSIS — M170 Bilateral primary osteoarthritis of knee: Secondary | ICD-10-CM | POA: Diagnosis not present

## 2018-06-22 NOTE — Progress Notes (Signed)
EPIC Encounter for ICM Monitoring  Patient Name: Marie Ramos is a 62 y.o. female Date: 06/22/2018 Primary Care Physican: Lawerance Cruel, MD Primary Cardiologist: Allred Electrophysiologist: Allred Last Weight: 260lbs    Today's Weight:  unknown      Transmission reviewed.    Thoracic impedance normal   Prescribed: Furosemide 20 mg 1 tablet daily  Labs: 01/09/2017 Creatinine 0.88, BUN 24, Potassium 4.5, Sodium 145, EGFR 71-82  Recommendations:  None  Follow-up plan: ICM clinic phone appointment on 07/26/2018.    Copy of ICM check sent to Dr. Rayann Heman.   3 month ICM trend: 06/21/2018    1 Year ICM trend:       [name], RN 06/22/2018 8:03 AM

## 2018-07-06 ENCOUNTER — Ambulatory Visit: Payer: Medicare HMO | Admitting: Physician Assistant

## 2018-07-09 ENCOUNTER — Ambulatory Visit: Payer: Medicare HMO | Admitting: Physician Assistant

## 2018-07-16 DIAGNOSIS — L400 Psoriasis vulgaris: Secondary | ICD-10-CM | POA: Diagnosis not present

## 2018-07-19 LAB — CUP PACEART REMOTE DEVICE CHECK
Brady Statistic AP VP Percent: 0 %
Brady Statistic AS VP Percent: 0.05 %
Brady Statistic AS VS Percent: 99.73 %
Date Time Interrogation Session: 20191114051704
HighPow Impedance: 86 Ohm
Implantable Lead Implant Date: 20170227
Implantable Lead Location: 753859
Implantable Lead Model: 5076
Lead Channel Impedance Value: 361 Ohm
Lead Channel Pacing Threshold Amplitude: 0.625 V
Lead Channel Pacing Threshold Pulse Width: 0.4 ms
Lead Channel Sensing Intrinsic Amplitude: 20.5 mV
Lead Channel Sensing Intrinsic Amplitude: 20.5 mV
Lead Channel Setting Pacing Amplitude: 2 V
Lead Channel Setting Pacing Amplitude: 2.5 V
Lead Channel Setting Pacing Pulse Width: 0.4 ms
Lead Channel Setting Sensing Sensitivity: 0.3 mV
MDC IDC LEAD IMPLANT DT: 20170227
MDC IDC LEAD LOCATION: 753860
MDC IDC MSMT BATTERY REMAINING LONGEVITY: 106 mo
MDC IDC MSMT BATTERY VOLTAGE: 3 V
MDC IDC MSMT LEADCHNL RA PACING THRESHOLD PULSEWIDTH: 0.4 ms
MDC IDC MSMT LEADCHNL RA SENSING INTR AMPL: 3.125 mV
MDC IDC MSMT LEADCHNL RA SENSING INTR AMPL: 3.125 mV
MDC IDC MSMT LEADCHNL RV IMPEDANCE VALUE: 399 Ohm
MDC IDC MSMT LEADCHNL RV IMPEDANCE VALUE: 475 Ohm
MDC IDC MSMT LEADCHNL RV PACING THRESHOLD AMPLITUDE: 0.5 V
MDC IDC PG IMPLANT DT: 20170227
MDC IDC STAT BRADY AP VS PERCENT: 0.22 %
MDC IDC STAT BRADY RA PERCENT PACED: 0.22 %
MDC IDC STAT BRADY RV PERCENT PACED: 0.05 %

## 2018-07-26 ENCOUNTER — Ambulatory Visit (INDEPENDENT_AMBULATORY_CARE_PROVIDER_SITE_OTHER): Payer: Medicare HMO

## 2018-07-26 DIAGNOSIS — Z95810 Presence of automatic (implantable) cardiac defibrillator: Secondary | ICD-10-CM | POA: Diagnosis not present

## 2018-07-26 DIAGNOSIS — I5032 Chronic diastolic (congestive) heart failure: Secondary | ICD-10-CM | POA: Diagnosis not present

## 2018-07-27 NOTE — Progress Notes (Signed)
EPIC Encounter for ICM Monitoring  Patient Name: Marie Ramos is a 63 y.o. female Date: 07/27/2018 Primary Care Physican: Lawerance Cruel, MD Primary Cardiologist: Allred Electrophysiologist: Allred Last Weight:260lbs Today's Weight: 256 lbs                                             Heart failure questions reviewed.  Patient asymptomatic.  She stated she is feeling good.  Thoracic impedance normal  Prescribed:Furosemide 20 mg 1 tablet daily  Labs: 01/09/2017 Creatinine 0.88, BUN 24, Potassium 4.5, Sodium 145, EGFR 71-82  Recommendations:No changes.  Encouraged to call for fluid symptoms.  Follow-up plan: ICM clinic phone appointment on2/24/2020.   Copy of ICM check sent to Dr.Allred.   3 month ICM trend: 07/26/2018    1 Year ICM trend:       [name], RN 07/27/2018 10:57 AM

## 2018-08-05 ENCOUNTER — Encounter

## 2018-08-05 ENCOUNTER — Other Ambulatory Visit: Payer: Self-pay | Admitting: Physician Assistant

## 2018-08-05 ENCOUNTER — Telehealth: Payer: Self-pay | Admitting: Physician Assistant

## 2018-08-05 ENCOUNTER — Ambulatory Visit: Payer: Medicare HMO | Admitting: Physician Assistant

## 2018-08-05 ENCOUNTER — Encounter: Payer: Self-pay | Admitting: Physician Assistant

## 2018-08-05 VITALS — BP 140/86 | HR 86

## 2018-08-05 DIAGNOSIS — F411 Generalized anxiety disorder: Secondary | ICD-10-CM

## 2018-08-05 DIAGNOSIS — F319 Bipolar disorder, unspecified: Secondary | ICD-10-CM

## 2018-08-05 MED ORDER — VENLAFAXINE HCL ER 75 MG PO CP24: 75.0000 mg | ORAL_CAPSULE | Freq: Every day | ORAL | 0 refills | Status: DC

## 2018-08-05 MED ORDER — OXCARBAZEPINE 300 MG PO TABS: 450.0000 mg | ORAL_TABLET | Freq: Two times a day (BID) | ORAL | 0 refills | Status: DC

## 2018-08-05 MED ORDER — OXCARBAZEPINE 300 MG PO TABS: 300.0000 mg | ORAL_TABLET | Freq: Two times a day (BID) | ORAL | 0 refills | Status: DC

## 2018-08-05 MED ORDER — ALPRAZOLAM 0.25 MG PO TABS: 0.2500 mg | ORAL_TABLET | Freq: Every evening | ORAL | 2 refills | Status: DC | PRN

## 2018-08-05 NOTE — Telephone Encounter (Signed)
Pt called to advise she takes Oxcarbazepine 300 mg 1 1/2  2 times daily

## 2018-08-05 NOTE — Progress Notes (Signed)
[facility] MD/PA/NP Initial Note  08/05/2018 2:47 PM Marie Ramos  MRN:  [id number]  Chief Complaint:  Chief Complaint    Medication Refill; Other      HPI: Here to establish care.  She was diagnosed in 1997 with bipolar disorder.  She has been under the care of a psychiatrist ever since.  Her psychiatrist who she calls Dr. Lenna Ramos, is no longer taking her insurance so she has had to find a new provider.  States she is doing really well on her current medications are working just fine.  She has been on them at the current doses for "quite a while."  Patient denies loss of interest in usual activities and is able to enjoy things.  Denies decreased energy or motivation.  Appetite has not changed.  No extreme sadness, tearfulness, or feelings of hopelessness.  Denies any changes in concentration, making decisions or remembering things.  Denies suicidal or homicidal thoughts.  Patient denies increased energy with decreased need for sleep, no increased talkativeness, no racing thoughts, no impulsivity or risky behaviors, no increased spending, no increased libido, no grandiosity.  She does have anxiety at times mostly in the evenings when she cannot get her thoughts to slow down so that she can go to sleep.  She has been on Xanax 0.25 mg nightly and it is still effective.  She sleeps well.  She works as a Oceanographer at [employer].  She loves what she does.  She only works part-time.  She is disabled due to multiple heart problems as well as her mental health.  Has been on disability for 2 years now.  Visit Diagnosis:    ICD-10-CM   1. Bipolar I disorder ([place]) F31.9   2. Generalized anxiety disorder F41.1     Past Psychiatric History: Bipolar Disorder dx. Never been admitted for psych reasons. No h/o suicide attempt.  No h/o self-harm.  In her chart it lists that she had a traumatic brain injury.  She states that was secondary to a stroke not a head injury.  Past Medical  History:  Past Medical History:  Diagnosis Date  . Bipolar 1 disorder ([place])   . Cardiac arrest ([place]) 08/24/2015  . Chronically dry eyes   . Hypertrophic obstructive cardiomyopathy ([place]) 08/29/2015  . Macular degeneration   . QT prolongation 08/29/2015  . Stroke ([place])   . TBI (traumatic brain injury) [facility]) 2011   Inpatient rehab [facility]  . Ventricular fibrillation ([place]) 09/03/15   MDT ICD Dr. Rayann Heman    Past Surgical History:  Procedure Laterality Date  . ABDOMINAL HYSTERECTOMY    . CARDIAC CATHETERIZATION N/A 08/24/2015   Procedure: Left Heart Cath and Coronary Angiography;  Surgeon: Sherren Mocha, MD;  Location: [facility];  Service: Cardiovascular;  Laterality: N/A;  . EP IMPLANTABLE DEVICE N/A 09/03/2015   MDT dual chamber ICD  . SPLENECTOMY, TOTAL  2011  . TONSILLECTOMY      Family Psychiatric History: She thinks her mother had depression but was never diagnosed.  Her maternal grandfather committed suicide.  Family History:  Family History  Problem Relation Age of Onset  . Cancer Mother   . Depression Mother   . CAD Mother   . COPD Father   . Depression Father   . Diabetes Maternal Grandfather   . Depression Maternal Grandfather   . Arthritis/Rheumatoid Paternal Grandmother     Social History:  Social History   Socioeconomic History  . Marital status: Married  Spouse name: Not on file  . Number of children: 2  . Years of education: Not on file  . Highest education level: Master's degree (e.g., MA, MS, MEng, MEd, MSW, MBA)  Occupational History  . Occupation: substitute Product manager: [employer]  . Financial resource strain: Not hard at all  . Food insecurity:    Worry: Never true    Inability: Never true  . Transportation needs:    Medical: No    Non-medical: No  Tobacco Use  . Smoking status: Never Smoker  . Smokeless tobacco: Never Used  Substance and Sexual Activity  . Alcohol use: No  . Drug use: No  . Sexual  activity: Yes    Birth control/protection: Surgical  Lifestyle  . Physical activity:    Days per week: 0 days    Minutes per session: 0 min  . Stress: To some extent  Relationships  . Social connections:    Talks on phone: More than three times a week    Gets together: Once a week    Attends religious service: More than 4 times per year    Active member of club or organization: Yes    Attends meetings of clubs or organizations: More than 4 times per year    Relationship status: Married  Other Topics Concern  . Not on file  Social History Narrative   Married to [place] for 30 years 67st and only marriage.   2 kids, Marie Ramos is 88, and Marie Ramos is 44.      Has MBA in human resources but always wanted to teach.  Taught some business courses in past, but likes sub teaching.  High school or either pre-K.        From [place]. Moved here in 1997 b/c of husband's job at [employer].  Pt's son lives in [place]. Dtr lives in [place].  Has 1 granddaughter, 65 yo.      Grew up in home w/ both parents and sister.  Never abused.       Never legal issues except declared bankruptcy.      Caffeine 1 cup coffee. Or 1 coke a day.      Is Jewish.      Is on Disability for Mental health and heart issues, for 2 years.           Allergies:  Allergies  Allergen Reactions  . Naproxen Itching  . Vicodin [Hydrocodone-Acetaminophen] Itching    Metabolic Disorder Labs: Lab Results  Component Value Date   HGBA1C 6.2 (H) 09/03/2015   MPG 131 09/03/2015   No results found for: PROLACTIN Lab Results  Component Value Date   CHOL 225 (H) 09/03/2015   TRIG 333 (H) 09/03/2015   HDL 43 09/03/2015   CHOLHDL 5.2 09/03/2015   VLDL 67 (H) 09/03/2015   LDLCALC 115 (H) 09/03/2015   Lab Results  Component Value Date   TSH 1.952 09/02/2015    Therapeutic Level Labs: No results found for: LITHIUM No results found for: VALPROATE No components found for:  CBMZ  Current Medications: Current  Outpatient Medications  Medication Sig Dispense Refill  . acetaminophen (TYLENOL) 325 MG tablet Take 1-2 tablets (325-650 mg total) by mouth every 4 (four) hours as needed for mild pain.    Marland Kitchen ALPRAZolam (XANAX) 0.25 MG tablet Take 1 tablet (0.25 mg total) by mouth at bedtime as needed for anxiety or sleep. 30 tablet 2  . atorvastatin (LIPITOR) 20  MG tablet Take 1 tablet (20 mg total) by mouth daily at 6 PM. 30 tablet 0  . calcipotriene (DOVONOX) 0.005 % cream as directed.   0  . carvedilol (COREG) 12.5 MG tablet Take one (1) tablet (12.5 mg) by mouth two (2) times daily with breakfast and dinner. 180 tablet 3  . famotidine (PEPCID) 20 MG tablet Take 20 mg by mouth at bedtime.    . fexofenadine (ALLEGRA) 180 MG tablet Take 180 mg by mouth daily.    . fluocinonide (LIDEX) 0.05 % external solution APPLY TO AFFECTED AREA TWICE A DAY AS DIRECTED FOR 14 DAYS 14  1  . fluticasone (FLONASE) 50 MCG/ACT nasal spray Place into both nostrils daily. Use as directed    . furosemide (LASIX) 20 MG tablet TAKE ONE TABLET BY MOUTH ONE TIME DAILY  90 tablet 2  . losartan (COZAAR) 100 MG tablet Take 1 tablet (100 mg total) by mouth daily. 90 tablet 3  . multivitamin (PROSIGHT) TABS tablet Take 1 tablet by mouth daily. 30 each 0  . Oxcarbazepine (TRILEPTAL) 300 MG tablet Take 1 tablet (300 mg total) by mouth 2 (two) times daily. 180 tablet 0  . traMADol (ULTRAM) 50 MG tablet Take 50 mg by mouth daily as needed for pain.  0  . venlafaxine XR (EFFEXOR-XR) 75 MG 24 hr capsule Take 1 capsule (75 mg total) by mouth daily. 90 capsule 0  . amLODipine (NORVASC) 5 MG tablet Take 1 tablet (5 mg total) by mouth daily with breakfast. 30 tablet 0  . Apremilast (OTEZLA) 30 MG TABS Take by mouth.    . methocarbamol (ROBAXIN) 500 MG tablet TAKE ONE TABLET BY MOUTH TWICE DAILY as needed for muscle spasms (Patient not taking: Reported on 08/05/2018) 60 tablet 0  . triamcinolone ointment (KENALOG) 0.5 % as directed.  0   No current  facility-administered medications for this visit.     Medication Side Effects: none  Orders placed this visit:  No orders of the defined types were placed in this encounter.   Psychiatric Specialty Exam:  Review of Systems  Constitutional: Negative.   HENT: Negative.   Eyes: Negative.   Respiratory: Positive for shortness of breath.        Occas  Cardiovascular: Negative.   Gastrointestinal: Negative.   Genitourinary: Negative.   Musculoskeletal: Positive for joint pain.  Skin: Positive for itching.  Neurological: Negative.   Endo/Heme/Allergies: Negative.   Psychiatric/Behavioral: Negative.     Blood pressure 140/86, pulse 86.There is no height or weight on file to calculate BMI. initial BP was 168/96  General Appearance: Casual, Well Groomed and Obese  Eye Contact:  Good  Speech:  Clear and Coherent  Volume:  Normal  Mood:  Anxious  Affect:  Anxious  Thought Process:  Descriptions of Associations: Loose  Orientation:  Full (Time, Place, and Person)  Thought Content: Logical   Suicidal Thoughts:  No  Homicidal Thoughts:  No  Memory:  WNL  Judgement:  Good  Insight:  Good  Psychomotor Activity:  Normal  Concentration:  Concentration: Good  Recall:  Good  Fund of Knowledge: Good  Language: Good  Assets:  Desire for Improvement  ADL's:  Intact  Cognition: WNL  Prognosis:  Good   Screenings:  GAD-7     Office Visit from 08/05/2018 in [facility]  Total GAD-7 Score  2    PHQ2-9     Office Visit from 08/05/2018 in [facility] Office Visit from 07/24/2017 in   [facility] Office Visit from 09/26/2015 in [facility]  PHQ-2 Total Score  0  0  0  PHQ-9 Total Score  -  -  6      Receiving Psychotherapy: No   Treatment Plan/Recommendations: Continue Effexor XR 75 mg p.o. daily. Continue Trileptal 300 mg p.o. twice daily.  (There is some question of the dose.  She  thinks it may be 450 mg twice daily.  She will check her bottle when she gets home call back and let me know the correct dose.  If needed, I will send a new prescription into [facility].). Continue Xanax 0.25 mg nightly as needed. We will get records from her psychiatrist from 2019. Return in 3 months or sooner as needed.    Donnal Moat, PA-C

## 2018-08-19 ENCOUNTER — Ambulatory Visit (INDEPENDENT_AMBULATORY_CARE_PROVIDER_SITE_OTHER): Payer: Medicare HMO

## 2018-08-19 DIAGNOSIS — I429 Cardiomyopathy, unspecified: Secondary | ICD-10-CM

## 2018-08-20 LAB — CUP PACEART REMOTE DEVICE CHECK
Battery Remaining Longevity: 101 mo
Brady Statistic AP VS Percent: 0.03 %
Brady Statistic AS VP Percent: 0.05 %
Brady Statistic AS VS Percent: 99.92 %
Date Time Interrogation Session: 20200213093723
HIGH POWER IMPEDANCE MEASURED VALUE: 90 Ohm
Implantable Lead Implant Date: 20170227
Implantable Lead Location: 753859
Implantable Pulse Generator Implant Date: 20170227
Lead Channel Impedance Value: 399 Ohm
Lead Channel Impedance Value: 399 Ohm
Lead Channel Pacing Threshold Amplitude: 0.5 V
Lead Channel Pacing Threshold Amplitude: 0.625 V
Lead Channel Pacing Threshold Pulse Width: 0.4 ms
Lead Channel Sensing Intrinsic Amplitude: 3.75 mV
Lead Channel Sensing Intrinsic Amplitude: 3.75 mV
Lead Channel Setting Pacing Amplitude: 2.5 V
MDC IDC LEAD IMPLANT DT: 20170227
MDC IDC LEAD LOCATION: 753860
MDC IDC MSMT BATTERY VOLTAGE: 3 V
MDC IDC MSMT LEADCHNL RA PACING THRESHOLD PULSEWIDTH: 0.4 ms
MDC IDC MSMT LEADCHNL RV IMPEDANCE VALUE: 532 Ohm
MDC IDC MSMT LEADCHNL RV SENSING INTR AMPL: 21.875 mV
MDC IDC MSMT LEADCHNL RV SENSING INTR AMPL: 21.875 mV
MDC IDC SET LEADCHNL RA PACING AMPLITUDE: 2 V
MDC IDC SET LEADCHNL RV PACING PULSEWIDTH: 0.4 ms
MDC IDC SET LEADCHNL RV SENSING SENSITIVITY: 0.3 mV
MDC IDC STAT BRADY AP VP PERCENT: 0 %
MDC IDC STAT BRADY RA PERCENT PACED: 0.03 %
MDC IDC STAT BRADY RV PERCENT PACED: 0.05 %

## 2018-08-30 ENCOUNTER — Other Ambulatory Visit: Payer: Self-pay

## 2018-08-30 ENCOUNTER — Ambulatory Visit (INDEPENDENT_AMBULATORY_CARE_PROVIDER_SITE_OTHER): Payer: Medicare HMO

## 2018-08-30 DIAGNOSIS — Z95810 Presence of automatic (implantable) cardiac defibrillator: Secondary | ICD-10-CM | POA: Diagnosis not present

## 2018-08-30 DIAGNOSIS — I5032 Chronic diastolic (congestive) heart failure: Secondary | ICD-10-CM | POA: Diagnosis not present

## 2018-08-30 MED ORDER — OXCARBAZEPINE 300 MG PO TABS: 450.0000 mg | ORAL_TABLET | Freq: Two times a day (BID) | ORAL | 0 refills | Status: DC

## 2018-08-30 NOTE — Progress Notes (Signed)
EPIC Encounter for ICM Monitoring  Patient Name: Marie Ramos is a 63 y.o. female Date: 08/30/2018 Primary Care Physican: Lawerance Cruel, MD Primary Cardiologist: Allred Electrophysiologist: Allred Last Weight:256lbs Today's Weight: 255 lbs  Heart failure questions reviewed.  Patient asymptomatic.  She was on her way to [place] due to her father in law passed away.  Thoracic impedance trending just below baseline normal.  Prescribed:Furosemide 20 mg 1 tablet daily  Labs: 01/09/2017 Creatinine 0.88, BUN 24, Potassium 4.5, Sodium 145, GFR 71-82  Recommendations:No changes.  Encouraged to call for fluid symptoms.  Follow-up plan: ICM clinic phone appointment on3/30/2020.   Copy of ICM check sent to Dr.Allred.  3 month ICM trend: 08/30/2018    1 Year ICM trend:       [name], RN 08/30/2018 10:07 AM

## 2018-08-31 NOTE — Progress Notes (Signed)
Remote ICD transmission.   

## 2018-09-23 DIAGNOSIS — M7661 Achilles tendinitis, right leg: Secondary | ICD-10-CM | POA: Diagnosis not present

## 2018-09-23 DIAGNOSIS — M79671 Pain in right foot: Secondary | ICD-10-CM | POA: Diagnosis not present

## 2018-09-23 DIAGNOSIS — M722 Plantar fascial fibromatosis: Secondary | ICD-10-CM | POA: Diagnosis not present

## 2018-10-04 ENCOUNTER — Other Ambulatory Visit: Payer: Self-pay

## 2018-10-04 ENCOUNTER — Ambulatory Visit (INDEPENDENT_AMBULATORY_CARE_PROVIDER_SITE_OTHER): Payer: Medicare HMO

## 2018-10-04 DIAGNOSIS — Z95810 Presence of automatic (implantable) cardiac defibrillator: Secondary | ICD-10-CM

## 2018-10-04 DIAGNOSIS — I5032 Chronic diastolic (congestive) heart failure: Secondary | ICD-10-CM

## 2018-10-05 NOTE — Progress Notes (Signed)
EPIC Encounter for ICM Monitoring  Patient Name: Marie Ramos is a 63 y.o. female Date: 10/05/2018 Primary Care Physican: Lawerance Cruel, MD Primary Cardiologist: Allred Electrophysiologist: Allred Last Weight:255lbs 10/05/2018 Weight: 254 lbs  Heart failure questions reviewed. Patient asymptomatic.   Thoracic impedance trending just below baseline normal.  Prescribed:Furosemide 20 mg 1 tablet daily  Labs: 01/09/2017 Creatinine 0.88, BUN 24, Potassium 4.5, Sodium 145, GFR 71-82  Recommendations:No changes. Encouraged to call for fluid symptoms.  Follow-up plan: ICM clinic phone appointment on5/13/2020.   Copy of ICM check sent to Dr.Allred.  3 month ICM trend: 10/04/2018    1 Year ICM trend:       [name], RN 10/05/2018 9:20 AM

## 2018-10-20 DIAGNOSIS — L400 Psoriasis vulgaris: Secondary | ICD-10-CM | POA: Diagnosis not present

## 2018-10-25 DIAGNOSIS — M7661 Achilles tendinitis, right leg: Secondary | ICD-10-CM | POA: Diagnosis not present

## 2018-10-25 DIAGNOSIS — M722 Plantar fascial fibromatosis: Secondary | ICD-10-CM | POA: Diagnosis not present

## 2018-10-27 ENCOUNTER — Telehealth: Payer: Self-pay

## 2018-10-27 NOTE — Telephone Encounter (Signed)
Prior authorization submitted for Oxcarbazepine 300 mg 3 daily, through [facility] Case: [id number], Status: Approved, Coverage Starts on: 10/26/2018 12:00:00 AM, Coverage Ends on: 07/07/2019

## 2018-11-02 ENCOUNTER — Other Ambulatory Visit: Payer: Self-pay

## 2018-11-02 ENCOUNTER — Telehealth: Payer: Self-pay | Admitting: Physician Assistant

## 2018-11-02 MED ORDER — ALPRAZOLAM 0.25 MG PO TABS: 0.2500 mg | ORAL_TABLET | Freq: Every evening | ORAL | 1 refills | Status: DC | PRN

## 2018-11-02 NOTE — Telephone Encounter (Signed)
Pt needs refill for xanax sent to [facility] on [address].

## 2018-11-02 NOTE — Telephone Encounter (Signed)
Called in.

## 2018-11-03 ENCOUNTER — Telehealth: Payer: Self-pay | Admitting: Internal Medicine

## 2018-11-03 NOTE — Telephone Encounter (Signed)
°*  STAT* If patient is at the pharmacy, call can be transferred to refill team.   1. Which medications need to be refilled? (please list name of each medication and dose if known)  traMADol (ULTRAM) 50 MG tablet  2. Which pharmacy/location (including street and city if local pharmacy) is medication to be sent to? [id number]  0. Do they need a 30 day or 90 day supply? [place]

## 2018-11-04 ENCOUNTER — Ambulatory Visit (INDEPENDENT_AMBULATORY_CARE_PROVIDER_SITE_OTHER): Payer: Medicare HMO | Admitting: Physician Assistant

## 2018-11-04 ENCOUNTER — Other Ambulatory Visit: Payer: Self-pay

## 2018-11-04 ENCOUNTER — Encounter: Payer: Self-pay | Admitting: Physician Assistant

## 2018-11-04 DIAGNOSIS — F411 Generalized anxiety disorder: Secondary | ICD-10-CM

## 2018-11-04 DIAGNOSIS — F319 Bipolar disorder, unspecified: Secondary | ICD-10-CM | POA: Diagnosis not present

## 2018-11-04 MED ORDER — VENLAFAXINE HCL ER 75 MG PO CP24: 75.0000 mg | ORAL_CAPSULE | Freq: Every day | ORAL | 1 refills | Status: DC

## 2018-11-04 NOTE — Telephone Encounter (Signed)
Pt calling requesting a refill on Tramadol 50 mg tablet. Would Tommye Standard, PA like to refill this medication? Please address

## 2018-11-04 NOTE — Progress Notes (Signed)
[facility] Med Check  Patient ID: Marie Ramos,  MRN: [id number]  PCP: Lawerance Cruel, MD  Date of Evaluation: 11/04/2018 Time spent:15 minutes  Chief Complaint:  Chief Complaint    Follow-up     Virtual Visit via Telephone Note  I connected with patient by a video enabled telemedicine application or telephone, with their informed consent, and verified patient privacy and that I am speaking with the correct person using two identifiers.  I am private, in my home and the patient is home.  I discussed the limitations, risks, security and privacy concerns of performing an evaluation and management service by telephone and the availability of in person appointments. I also discussed with the patient that there may be a patient responsible charge related to this service. The patient expressed understanding and agreed to proceed.   I discussed the assessment and treatment plan with the patient. The patient was provided an opportunity to ask questions and all were answered. The patient agreed with the plan and demonstrated an understanding of the instructions.   The patient was advised to call back or seek an in-person evaluation if the symptoms worsen or if the condition fails to improve as anticipated.  I provided 15 minutes of non-face-to-face time during this encounter.  HISTORY/CURRENT STATUS: HPI 26-month med check.  Patient states she is doing very well.  Her medications are working just fine.  She sleeps well.  Anxiety is well controlled.  She usually only takes the benzo in the evening so that she can get thoughts out of her mind so she can relax and go to sleep.  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  Due to the closed schools, she is unable to work now.  Patient denies loss of interest in usual activities and is able to enjoy things.  Denies decreased energy or motivation.  Appetite has not changed.  No  extreme sadness, tearfulness, or feelings of hopelessness.  Denies any changes in concentration, making decisions or remembering things.  Denies suicidal or homicidal thoughts.  Patient denies increased energy with decreased need for sleep, no increased talkativeness, no racing thoughts, no impulsivity or risky behaviors, no increased spending, no increased libido, no grandiosity.  Denies muscle or joint pain, stiffness, or dystonia.  Denies dizziness, syncope, seizures, numbness, tingling, tremor, tics, unsteady gait, slurred speech, confusion.   Individual Medical History/ Review of Systems: Changes? :No    Past medications for mental health diagnoses include: unknown  Allergies: Naproxen and Vicodin [hydrocodone-acetaminophen]  Current Medications:  Current Outpatient Medications:  .  ALPRAZolam (XANAX) 0.25 MG tablet, Take 1 tablet (0.25 mg total) by mouth at bedtime as needed for anxiety or sleep., Disp: 30 tablet, Rfl: 1 .  amLODipine (NORVASC) 5 MG tablet, Take 1 tablet (5 mg total) by mouth daily with breakfast., Disp: 30 tablet, Rfl: 0 .  atorvastatin (LIPITOR) 20 MG tablet, Take 1 tablet (20 mg total) by mouth daily at 6 PM., Disp: 30 tablet, Rfl: 0 .  carvedilol (COREG) 12.5 MG tablet, Take one (1) tablet (12.5 mg) by mouth two (2) times daily with breakfast and dinner., Disp: 180 tablet, Rfl: 3 .  famotidine (PEPCID) 20 MG tablet, Take 20 mg by mouth at bedtime., Disp: , Rfl:  .  fexofenadine (ALLEGRA) 180 MG tablet, Take 180 mg by mouth daily., Disp: , Rfl:  .  fluocinonide (LIDEX) 0.05 % external solution, APPLY TO AFFECTED AREA TWICE A  DAY AS DIRECTED FOR 14 DAYS 14, Disp: , Rfl: 1 .  fluticasone (FLONASE) 50 MCG/ACT nasal spray, Place into both nostrils daily. Use as directed, Disp: , Rfl:  .  furosemide (LASIX) 20 MG tablet, TAKE ONE TABLET BY MOUTH ONE TIME DAILY , Disp: 90 tablet, Rfl: 2 .  losartan (COZAAR) 100 MG tablet, Take 1 tablet (100 mg total) by mouth daily.,  Disp: 90 tablet, Rfl: 3 .  multivitamin (PROSIGHT) TABS tablet, Take 1 tablet by mouth daily., Disp: 30 each, Rfl: 0 .  Oxcarbazepine (TRILEPTAL) 300 MG tablet, Take 1.5 tablets (450 mg total) by mouth 2 (two) times daily., Disp: 270 tablet, Rfl: 0 .  traMADol (ULTRAM) 50 MG tablet, Take 50 mg by mouth daily as needed for pain., Disp: , Rfl: 0 .  triamcinolone ointment (KENALOG) 0.5 %, as directed., Disp: , Rfl: 0 .  venlafaxine XR (EFFEXOR-XR) 75 MG 24 hr capsule, Take 1 capsule (75 mg total) by mouth daily., Disp: 90 capsule, Rfl: 1 .  Apremilast (OTEZLA) 30 MG TABS, Take by mouth., Disp: , Rfl:  .  calcipotriene (DOVONOX) 0.005 % cream, as directed. , Disp: , Rfl: 0 .  methocarbamol (ROBAXIN) 500 MG tablet, TAKE ONE TABLET BY MOUTH TWICE DAILY as needed for muscle spasms (Patient not taking: Reported on 08/05/2018), Disp: 60 tablet, Rfl: 0 Medication Side Effects: none  Family Medical/ Social History: Changes? Yes coronavirus pandemic and not working now  [place]:  There were no vitals taken for this visit.There is no height or weight on file to calculate BMI.  General Appearance: unable to assess  Eye Contact:  unable to assess  Speech:  Clear and Coherent  Volume:  Normal  Mood:  Euthymic  Affect:  unable to assess  Thought Process:  Goal Directed  Orientation:  Full (Time, Place, and Person)  Thought Content: Logical   Suicidal Thoughts:  No  Homicidal Thoughts:  No  Memory:  WNL  Judgement:  Good  Insight:  Good  Psychomotor Activity:  unable to assess  Concentration:  Concentration: Good  Recall:  Good  Fund of Knowledge: Good  Language: Good  Assets:  Desire for Improvement  ADL's:  Intact  Cognition: WNL  Prognosis:  Good    DIAGNOSES:    ICD-10-CM   1. Bipolar I disorder ([place]) F31.9   2. Generalized anxiety disorder F41.1     Receiving Psychotherapy: No    RECOMMENDATIONS: She seems to be doing really well so I will make no changes. Continue  Xanax 0.25 mg p.o. nightly as needed sleep. Continue Trileptal 300 mg, 1.5 p.o. twice daily. Continue Effexor XR 75 mg daily. Return in 6 months or sooner as needed.  Donnal Moat, PA-C   This record has been created using [employer].  Chart creation errors have been sought, but may not always have been located and corrected. Such creation errors do not reflect on the standard of medical care.

## 2018-11-05 NOTE — Telephone Encounter (Signed)
Called pt to inform her per Tommye Standard, PA that pt needed to reach out to her PMD or whomever is managing her pain medication and request a refill for her Tramadol and if she has any other problems, questions or concerns to give our office a call back. Pt verbalized understanding.

## 2018-11-17 ENCOUNTER — Ambulatory Visit (INDEPENDENT_AMBULATORY_CARE_PROVIDER_SITE_OTHER): Payer: Medicare HMO

## 2018-11-17 ENCOUNTER — Other Ambulatory Visit: Payer: Self-pay

## 2018-11-17 DIAGNOSIS — Z95810 Presence of automatic (implantable) cardiac defibrillator: Secondary | ICD-10-CM

## 2018-11-17 DIAGNOSIS — I5032 Chronic diastolic (congestive) heart failure: Secondary | ICD-10-CM | POA: Diagnosis not present

## 2018-11-18 ENCOUNTER — Other Ambulatory Visit: Payer: Self-pay

## 2018-11-18 ENCOUNTER — Ambulatory Visit (INDEPENDENT_AMBULATORY_CARE_PROVIDER_SITE_OTHER): Payer: Medicare HMO | Admitting: *Deleted

## 2018-11-18 DIAGNOSIS — I429 Cardiomyopathy, unspecified: Secondary | ICD-10-CM

## 2018-11-18 LAB — CUP PACEART REMOTE DEVICE CHECK
Battery Remaining Longevity: 96 mo
Battery Voltage: 2.99 V
Brady Statistic AP VP Percent: 0 %
Brady Statistic AP VS Percent: 0.04 %
Brady Statistic AS VP Percent: 0.04 %
Brady Statistic AS VS Percent: 99.92 %
Brady Statistic RA Percent Paced: 0.04 %
Brady Statistic RV Percent Paced: 0.04 %
Date Time Interrogation Session: 20200514124345
HighPow Impedance: 90 Ohm
Implantable Lead Implant Date: 20170227
Implantable Lead Implant Date: 20170227
Implantable Lead Location: 753859
Implantable Lead Location: 753860
Implantable Lead Model: 5076
Implantable Pulse Generator Implant Date: 20170227
Lead Channel Impedance Value: 399 Ohm
Lead Channel Impedance Value: 399 Ohm
Lead Channel Impedance Value: 532 Ohm
Lead Channel Pacing Threshold Amplitude: 0.375 V
Lead Channel Pacing Threshold Amplitude: 0.625 V
Lead Channel Pacing Threshold Pulse Width: 0.4 ms
Lead Channel Pacing Threshold Pulse Width: 0.4 ms
Lead Channel Sensing Intrinsic Amplitude: 23 mV
Lead Channel Sensing Intrinsic Amplitude: 23 mV
Lead Channel Sensing Intrinsic Amplitude: 3.25 mV
Lead Channel Sensing Intrinsic Amplitude: 3.25 mV
Lead Channel Setting Pacing Amplitude: 2 V
Lead Channel Setting Pacing Amplitude: 2.5 V
Lead Channel Setting Pacing Pulse Width: 0.4 ms
Lead Channel Setting Sensing Sensitivity: 0.3 mV

## 2018-11-19 NOTE — Progress Notes (Signed)
EPIC Encounter for ICM Monitoring  Patient Name: Marie Ramos is a 63 y.o. female Date: 11/19/2018 Primary Care Physican: Lawerance Cruel, MD Primary Cardiologist: Allred Electrophysiologist: Allred Last Weight:255lbs 10/05/2018 Weight: 254lbs  Transmission reviewed and results sent by [facility]  Thoracic impedance normal.  Prescribed:Furosemide 20 mg 1 tablet daily  Labs: 01/09/2017 Creatinine 0.88, BUN 24, Potassium 4.5, Sodium 145, GFR 71-82  Recommendations: Advised to limit salt and fluid intake  Follow-up plan: ICM clinic phone appointment on 12/21/2018.   Copy of ICM check sent to Dr.Allred.  3 month ICM trend: 11/17/2018    1 Year ICM trend:       [name], RN 11/19/2018 3:10 PM

## 2018-11-23 DIAGNOSIS — S52502D Unspecified fracture of the lower end of left radius, subsequent encounter for closed fracture with routine healing: Secondary | ICD-10-CM | POA: Diagnosis not present

## 2018-11-23 DIAGNOSIS — M79602 Pain in left arm: Secondary | ICD-10-CM | POA: Diagnosis not present

## 2018-11-24 ENCOUNTER — Ambulatory Visit
Admission: RE | Admit: 2018-11-24 | Discharge: 2018-11-24 | Disposition: A | Discharge status: Home / Self Care | Payer: Medicare HMO | Source: Ambulatory Visit | Attending: Sports Medicine | Admitting: Sports Medicine

## 2018-11-24 ENCOUNTER — Other Ambulatory Visit: Payer: Self-pay | Admitting: Sports Medicine

## 2018-11-24 ENCOUNTER — Encounter: Payer: Self-pay | Admitting: Cardiology

## 2018-11-24 DIAGNOSIS — R52 Pain, unspecified: Secondary | ICD-10-CM

## 2018-11-24 DIAGNOSIS — M25532 Pain in left wrist: Secondary | ICD-10-CM | POA: Diagnosis not present

## 2018-11-24 NOTE — Patient Instructions (Addendum)
Marie Ramos  11/24/2018   Your procedure is scheduled on: 12-01-18    Report to [facility] Main  Entrance    Report to Admitting at 10:35 AM   YOU NEED TO HAVE A COVID 19 TEST ON 11-26-18  THIS TEST MUST BE DONE BEFORE SURGERY, COME TO [place] ENTRANCE BETWEEN THE HOURS OF 900 AM AND 300 PM ON YOUR COVID TEST DATE.   Call this number if you have problems the morning of surgery [phone]    Remember: Do not eat food or drink liquids :After Midnight.    BRUSH YOUR TEETH MORNING OF SURGERY AND RINSE YOUR MOUTH OUT, NO CHEWING GUM CANDY OR MINTS.     Take these medicines the morning of surgery with A SIP OF WATER: Amlodipine (Norvasc), Carvedilol (Coreg), Fexofenadine (Allegra), Venlafaxine XR (Effexor-XR), Oxcarbazepine (Trileptal). You can also bring and use your nasal spray                                You may not have any metal on your body including hair pins and              piercings     Do not wear jewelry, make-up, lotions, powders or perfumes, deodorant               Do not wear nail polish.  Do not shave  48 hours prior to surgery.             Do not bring valuables to the hospital. [place].  Contacts, dentures or bridgework may not be worn into surgery.    Special Instructions: N/A              Please read over the following fact sheets you were given: _____________________________________________________________________             [facility] - Preparing for Surgery Before surgery, you can play an important role.  Because skin is not sterile, your skin needs to be as free of germs as possible.  You can reduce the number of germs on your skin by washing with CHG (chlorahexidine gluconate) soap before surgery.  CHG is an antiseptic cleaner which kills germs and bonds with the skin to continue killing germs even after washing. Please DO NOT use if you  have an allergy to CHG or antibacterial soaps.  If your skin becomes reddened/irritated stop using the CHG and inform your nurse when you arrive at Short Stay. Do not shave (including legs and underarms) for at least 48 hours prior to the first CHG shower.  You may shave your face/neck. Please follow these instructions carefully:  1.  Shower with CHG Soap the night before surgery and the  morning of Surgery.  2.  If you choose to wash your hair, wash your hair first as usual with your  normal  shampoo.  3.  After you shampoo, rinse your hair and body thoroughly to remove the  shampoo.                           4.  Use CHG as you would any other liquid soap.  You can apply chg  directly  to the skin and wash                       Gently with a scrungie or clean washcloth.  5.  Apply the CHG Soap to your body ONLY FROM THE NECK DOWN.   Do not use on face/ open                           Wound or open sores. Avoid contact with eyes, ears mouth and genitals (private parts).                       Wash face,  Genitals (private parts) with your normal soap.             6.  Wash thoroughly, paying special attention to the area where your surgery  will be performed.  7.  Thoroughly rinse your body with warm water from the neck down.  8.  DO NOT shower/wash with your normal soap after using and rinsing off  the CHG Soap.                9.  Pat yourself dry with a clean towel.            10.  Wear clean pajamas.            11.  Place clean sheets on your bed the night of your first shower and do not  sleep with pets. Day of Surgery : Do not apply any lotions/deodorants the morning of surgery.  Please wear clean clothes to the hospital/surgery center.  FAILURE TO FOLLOW THESE INSTRUCTIONS MAY RESULT IN THE CANCELLATION OF YOUR SURGERY PATIENT SIGNATURE_________________________________  NURSE  SIGNATURE__________________________________  ________________________________________________________________________

## 2018-11-24 NOTE — Progress Notes (Signed)
SPOKE W/Pt _     SCREENING SYMPTOMS OF COVID 19:   COUGH--No  RUNNY NOSE--- No  SORE THROAT---No  NASAL CONGESTION----No  SNEEZING----No  SHORTNESS OF BREATH---No  DIFFICULTY BREATHING---No  TEMP >100.0 -----No  UNEXPLAINED BODY ACHES------No  CHILLS --------No   HEADACHES ---------No  LOSS OF SMELL/ TASTE --------No    HAVE YOU OR ANY FAMILY MEMBER TRAVELLED PAST 14 DAYS OUT OF THE   COUNTY---No STATE----No COUNTRY---No-  HAVE YOU OR ANY FAMILY MEMBER BEEN EXPOSED TO ANYONE WITH COVID 19? No    

## 2018-11-24 NOTE — Progress Notes (Signed)
01-15-18 (Epic) EKG

## 2018-11-24 NOTE — Progress Notes (Signed)
Remote ICD transmission.   

## 2018-11-25 ENCOUNTER — Telehealth: Payer: Self-pay

## 2018-11-25 NOTE — Telephone Encounter (Signed)
   Primary Cardiologist: Thompson Grayer, MD  Chart reviewed as part of pre-operative protocol coverage. Patient was contacted 11/25/2018 in reference to pre-operative risk assessment for pending surgery as outlined below.  Marie Ramos was last seen on 01/15/2018 by Tommye Standard, PA-C.  Since that day, Marie Ramos has done fine from a cardiac standpoint. She is still caring for her 63 yo grandson who keeps her busy. No complaints of chest pain or SOB. She is somewhat limited in mobility by a heel spur and recently had wrist fracture after a mechanical fall. She can complete 4 METS without anginal complaints and has not experienced any ICD shocks  Therefore, based on ACC/AHA guidelines, the patient would be at acceptable risk for the planned procedure without further cardiovascular testing.   I will route this recommendation to the requesting party via Epic fax function and remove from pre-op pool.  Please call with questions.  Abigail Butts, PA-C 11/25/2018, 2:18 PM

## 2018-11-25 NOTE — Telephone Encounter (Signed)
   [facility] Pre-operative Risk Assessment    Request for surgical clearance:  1. What type of surgery is being performed? LEFT ORIF DISTAL RADIUS FRACTURE   2. When is this surgery scheduled? 12/01/18   3. What type of clearance is required (medical clearance vs. Pharmacy clearance to hold med vs. Both)? MEDICAL  4. Are there any medications that need to be held prior to surgery and how long? NONE   5. Practice name and name of physician performing surgery? [facility]   6. What is your office phone number [phone] EXT: [phone]    2.   What is your office fax number [phone]; ATTN: SHERRI  8.   Anesthesia type (None, local, MAC, general) ? NONE LISTED   Jacinta Shoe 11/25/2018, 1:28 PM  _________________________________________________________________   (provider comments below)

## 2018-11-26 ENCOUNTER — Other Ambulatory Visit: Payer: Self-pay

## 2018-11-26 ENCOUNTER — Encounter (HOSPITAL_COMMUNITY): Payer: Self-pay

## 2018-11-26 ENCOUNTER — Other Ambulatory Visit (HOSPITAL_COMMUNITY)
Admission: RE | Admit: 2018-11-26 | Discharge: 2018-11-26 | Disposition: A | Discharge status: Home / Self Care | Payer: Medicare HMO | Source: Ambulatory Visit | Attending: Orthopaedic Surgery | Admitting: Orthopaedic Surgery

## 2018-11-26 ENCOUNTER — Encounter (HOSPITAL_COMMUNITY)
Admission: RE | Admit: 2018-11-26 | Discharge: 2018-11-26 | Disposition: A | Discharge status: Home / Self Care | Payer: Medicare HMO | Source: Ambulatory Visit | Attending: Orthopaedic Surgery | Admitting: Orthopaedic Surgery

## 2018-11-26 DIAGNOSIS — Z01818 Encounter for other preprocedural examination: Secondary | ICD-10-CM | POA: Diagnosis not present

## 2018-11-26 DIAGNOSIS — S52502A Unspecified fracture of the lower end of left radius, initial encounter for closed fracture: Secondary | ICD-10-CM | POA: Insufficient documentation

## 2018-11-26 DIAGNOSIS — Z1159 Encounter for screening for other viral diseases: Secondary | ICD-10-CM | POA: Diagnosis not present

## 2018-11-26 DIAGNOSIS — Z7951 Long term (current) use of inhaled steroids: Secondary | ICD-10-CM | POA: Insufficient documentation

## 2018-11-26 DIAGNOSIS — Z8674 Personal history of sudden cardiac arrest: Secondary | ICD-10-CM | POA: Insufficient documentation

## 2018-11-26 DIAGNOSIS — X58XXXA Exposure to other specified factors, initial encounter: Secondary | ICD-10-CM | POA: Diagnosis not present

## 2018-11-26 DIAGNOSIS — Z79899 Other long term (current) drug therapy: Secondary | ICD-10-CM | POA: Insufficient documentation

## 2018-11-26 DIAGNOSIS — I421 Obstructive hypertrophic cardiomyopathy: Secondary | ICD-10-CM | POA: Diagnosis not present

## 2018-11-26 DIAGNOSIS — F319 Bipolar disorder, unspecified: Secondary | ICD-10-CM | POA: Diagnosis not present

## 2018-11-26 DIAGNOSIS — H3530 Unspecified macular degeneration: Secondary | ICD-10-CM | POA: Diagnosis not present

## 2018-11-26 DIAGNOSIS — Z8673 Personal history of transient ischemic attack (TIA), and cerebral infarction without residual deficits: Secondary | ICD-10-CM | POA: Insufficient documentation

## 2018-11-26 DIAGNOSIS — Z95810 Presence of automatic (implantable) cardiac defibrillator: Secondary | ICD-10-CM | POA: Diagnosis not present

## 2018-11-26 LAB — BASIC METABOLIC PANEL
Anion gap: 11 (ref 5–15)
BUN: 41 mg/dL — ABNORMAL HIGH (ref 8–23)
CO2: 29 mmol/L (ref 22–32)
Calcium: 9 mg/dL (ref 8.9–10.3)
Chloride: 102 mmol/L (ref 98–111)
Creatinine, Ser: 1.2 mg/dL — ABNORMAL HIGH (ref 0.44–1.00)
GFR calc Af Amer: 56 mL/min — ABNORMAL LOW (ref >60)
GFR calc non Af Amer: 48 mL/min — ABNORMAL LOW (ref >60)
Glucose, Bld: 116 mg/dL — ABNORMAL HIGH (ref 70–99)
Potassium: 3.9 mmol/L (ref 3.5–5.1)
Sodium: 142 mmol/L (ref 135–145)

## 2018-11-26 LAB — CBC
HCT: 38.8 % (ref 36.0–46.0)
Hemoglobin: 11.6 g/dL — ABNORMAL LOW (ref 12.0–15.0)
MCH: 29.8 pg (ref 26.0–34.0)
MCHC: 29.9 g/dL — ABNORMAL LOW (ref 30.0–36.0)
MCV: 99.7 fL (ref 80.0–100.0)
Platelets: 501 10*3/uL — ABNORMAL HIGH (ref 150–400)
RBC: 3.89 MIL/uL (ref 3.87–5.11)
RDW: 14.7 % (ref 11.5–15.5)
WBC: 10.9 10*3/uL — ABNORMAL HIGH (ref 4.0–10.5)
nRBC: 0 % (ref 0.0–0.2)

## 2018-11-27 LAB — NOVEL CORONAVIRUS, NAA (HOSP ORDER, SEND-OUT TO REF LAB; TAT 18-24 HRS): SARS-CoV-2, NAA: NOT DETECTED

## 2018-11-30 NOTE — Progress Notes (Signed)
Anesthesia Chart Review   Case:  [id number] Date/Time:  12/01/18 1221   Procedure:  OPEN REDUCTION INTERNAL FIXATION (ORIF) LEFT DISTAL RADIAL FRACTURE (Left )   Anesthesia type:  Choice   Pre-op diagnosis:  LEFT DISTAL RADIUS FRACTURE   Location:  [facility] ROOM 06 / [facility]   Surgeon:  Hiram Gash, MD      DISCUSSION: 63 yo never smoker with h/o bipolar disorder, TBI 2011, stroke, hypertrophic obstructive cardiomyopathy, ICD in place (last interrogation 11/18/2018), left distal radius fracture scheduled for above procedure 12/01/2018 with Dr. Ophelia Charter.   Cardiac clearance received 11/25/2018.  Per Roby Lofts, PA-C, "Marie Ramos was last seen on 01/15/2018 by Tommye Standard, PA-C.  Since that day, Marie Ramos has done fine from a cardiac standpoint. She is still caring for her 33 yo grandson who keeps her busy. No complaints of chest pain or SOB. She is somewhat limited in mobility by a heel spur and recently had wrist fracture after a mechanical fall. She can complete 4 METS without anginal complaints and has not experienced any ICD shocks.  Therefore, based on ACC/AHA guidelines, the patient would be at acceptable risk for the planned procedure without further cardiovascular testing."  Pt can proceed with planned procedure barring acute status change.  VS: BP 131/60   Pulse 80   Temp 36.8 C (Oral)   Resp 18   Ht 5\' 2"  (1.575 m)   Wt 122.5 kg   SpO2 97%   BMI 49.41 kg/m   PROVIDERS: Lawerance Cruel, MD is PCP   Thompson Grayer, MD is Cardiologist  LABS: Labs reviewed: Acceptable for surgery. (all labs ordered are listed, but only abnormal results are displayed)  Labs Reviewed  BASIC METABOLIC PANEL - Abnormal; Notable for the following components:      Result Value   Glucose, Bld 116 (*)    BUN 41 (*)    Creatinine, Ser 1.20 (*)    GFR calc non Af Amer 48 (*)    GFR calc Af Amer 56 (*)    All other components within normal limits  CBC - Abnormal; Notable for the  following components:   WBC 10.9 (*)    Hemoglobin 11.6 (*)    MCHC 29.9 (*)    Platelets 501 (*)    All other components within normal limits     IMAGES: US Carotid 09/03/15 Summary: No significant extracranial carotid artery stenosis demonstrated. Vertebrals are patent with antegrade flow.  EKG: 01/15/18 Rate 86 bpm Sinus rhythm with 1st degree AV block  Otherwise normal ECG   CV: Echo 07/14/16 Study Conclusions  - Left ventricle: The cavity size was normal. Wall thickness was   increased in a pattern of mild LVH. Systolic function was normal.   The estimated ejection fraction was in the range of 55% to 60%.   Wall motion was normal; there were no regional wall motion   abnormalities. Doppler parameters are consistent with abnormal   left ventricular relaxation (grade 1 diastolic dysfunction). - Aortic valve: There was no stenosis. - Mitral valve: Mildly calcified annulus. There was no significant   regurgitation. - Right ventricle: The cavity size was normal. Pacer wire or   catheter noted in right ventricle. Systolic function was normal. - Pulmonary arteries: No complete TR doppler jet so unable to   estimate PA systolic pressure. - Inferior vena cava: The vessel was normal in size. The   respirophasic diameter changes were in the normal range (=  50%),   consistent with normal central venous pressure.  Impressions:  - Normal LV size with mild LV hypertrophy. EF 55-60%. Normal RV   size and systolic function. No significant valvular   abnormalities.  Cardiac Cath 08/24/15 Widely patent coronary arteries Normal LVEDP Past Medical History:  Diagnosis Date  . Bipolar 1 disorder ([place])   . Cardiac arrest ([place]) 08/24/2015  . Chronically dry eyes   . Hypertrophic obstructive cardiomyopathy ([employer]) 08/29/2015  . Macular degeneration   . QT prolongation 08/29/2015  . Stroke ([place])   . TBI (traumatic brain injury) [facility]) 2011   Inpatient rehab [facility]  . Ventricular  fibrillation ([place]) 09/03/15   MDT ICD Dr. Rayann Heman    Past Surgical History:  Procedure Laterality Date  . ABDOMINAL HYSTERECTOMY    . CARDIAC CATHETERIZATION N/A 08/24/2015   Procedure: Left Heart Cath and Coronary Angiography;  Surgeon: Sherren Mocha, MD;  Location: [place] [facility];  Service: Cardiovascular;  Laterality: N/A;  . EP IMPLANTABLE DEVICE N/A 09/03/2015   MDT dual chamber ICD  . SPLENECTOMY, TOTAL  2011  . TONSILLECTOMY      MEDICATIONS: . ALPRAZolam (XANAX) 0.25 MG tablet  . amLODipine (NORVASC) 5 MG tablet  . atorvastatin (LIPITOR) 20 MG tablet  . carvedilol (COREG) 12.5 MG tablet  . clobetasol (TEMOVATE) 0.05 % external solution  . famotidine (PEPCID) 20 MG tablet  . fexofenadine (ALLEGRA) 180 MG tablet  . fluticasone (FLONASE) 50 MCG/ACT nasal spray  . furosemide (LASIX) 20 MG tablet  . HYDROcodone-acetaminophen (NORCO/VICODIN) 5-325 MG tablet  . losartan (COZAAR) 100 MG tablet  . methocarbamol (ROBAXIN) 500 MG tablet  . Multiple Vitamin (MULTIVITAMIN WITH MINERALS) TABS tablet  . multivitamin (PROSIGHT) TABS tablet  . Oxcarbazepine (TRILEPTAL) 300 MG tablet  . traMADol (ULTRAM) 50 MG tablet  . triamcinolone ointment (KENALOG) 0.5 %  . venlafaxine XR (EFFEXOR-XR) 75 MG 24 hr capsule   No current facility-administered medications for this encounter.     Maia Plan [facility] Pre-Surgical Testing [phone] 11/30/18 11:26 AM

## 2018-11-30 NOTE — Anesthesia Preprocedure Evaluation (Addendum)
Anesthesia Evaluation  Patient identified by MRN, date of birth, ID band Patient awake    Reviewed: Allergy & Precautions, H&P , NPO status , Patient's Chart, lab work & pertinent test results  Airway Mallampati: II   Neck ROM: full    Dental   Pulmonary neg pulmonary ROS,    breath sounds clear to auscultation       Cardiovascular hypertension, +CHF  + Cardiac Defibrillator  Rhythm:regular Rate:Normal  H/o HOCM.     Neuro/Psych PSYCHIATRIC DISORDERS Bipolar Disorder CVA    GI/Hepatic   Endo/Other    Renal/GU      Musculoskeletal   Abdominal   Peds  Hematology   Anesthesia Other Findings   Reproductive/Obstetrics                             Anesthesia Physical Anesthesia Plan  ASA: III  Anesthesia Plan: General   Post-op Pain Management:  Regional for Post-op pain   Induction: Intravenous  PONV Risk Score and Plan: 3 and Dexamethasone, Midazolam and Treatment may vary due to age or medical condition  Airway Management Planned: LMA  Additional Equipment:   Intra-op Plan:   Post-operative Plan:   Informed Consent: I have reviewed the patients History and Physical, chart, labs and discussed the procedure including the risks, benefits and alternatives for the proposed anesthesia with the patient or authorized representative who has indicated his/her understanding and acceptance.       Plan Discussed with: CRNA, Anesthesiologist and Surgeon  Anesthesia Plan Comments: (See PAT note 11/26/18, Konrad Felix, PA-C)      Anesthesia Quick Evaluation

## 2018-11-30 NOTE — Progress Notes (Signed)
SPOKE W/  Via phone     SCREENING SYMPTOMS OF COVID 19:   COUGH-- no  RUNNY NOSE---  no  SORE THROAT--- no  NASAL CONGESTION---- no  SNEEZING---- no  SHORTNESS OF BREATH--- no  DIFFICULTY BREATHING--- no   TEMP >100.0 ----- no  UNEXPLAINED BODY ACHES------ no  CHILLS -------- no  HEADACHES --------- no  LOSS OF SMELL/ TASTE -------- no    HAVE YOU OR ANY FAMILY MEMBER TRAVELLED PAST 14 DAYS OUT OF THE   COUNTY--- no STATE---- no COUNTRY---- no  HAVE YOU OR ANY FAMILY MEMBER BEEN EXPOSED TO ANYONE WITH COVID 19?   no

## 2018-12-01 ENCOUNTER — Encounter (HOSPITAL_COMMUNITY)
Admission: RE | Disposition: A | Discharge status: Home / Self Care | Payer: Self-pay | Source: Home / Self Care | Attending: Orthopaedic Surgery

## 2018-12-01 ENCOUNTER — Ambulatory Visit (HOSPITAL_COMMUNITY): Payer: Medicare HMO | Admitting: Physician Assistant

## 2018-12-01 ENCOUNTER — Ambulatory Visit (HOSPITAL_COMMUNITY): Payer: Medicare HMO

## 2018-12-01 ENCOUNTER — Encounter (HOSPITAL_COMMUNITY): Payer: Self-pay | Admitting: Anesthesiology

## 2018-12-01 ENCOUNTER — Other Ambulatory Visit: Payer: Self-pay

## 2018-12-01 ENCOUNTER — Ambulatory Visit (HOSPITAL_COMMUNITY)
Admission: RE | Admit: 2018-12-01 | Discharge: 2018-12-02 | Disposition: A | Discharge status: Home / Self Care | Payer: Medicare HMO | Attending: Orthopaedic Surgery | Admitting: Orthopaedic Surgery

## 2018-12-01 ENCOUNTER — Ambulatory Visit (HOSPITAL_COMMUNITY): Payer: Medicare HMO | Admitting: Anesthesiology

## 2018-12-01 DIAGNOSIS — Z8261 Family history of arthritis: Secondary | ICD-10-CM | POA: Diagnosis not present

## 2018-12-01 DIAGNOSIS — Z885 Allergy status to narcotic agent status: Secondary | ICD-10-CM | POA: Diagnosis not present

## 2018-12-01 DIAGNOSIS — Z833 Family history of diabetes mellitus: Secondary | ICD-10-CM | POA: Diagnosis not present

## 2018-12-01 DIAGNOSIS — I509 Heart failure, unspecified: Secondary | ICD-10-CM | POA: Diagnosis not present

## 2018-12-01 DIAGNOSIS — S52572A Other intraarticular fracture of lower end of left radius, initial encounter for closed fracture: Secondary | ICD-10-CM | POA: Diagnosis not present

## 2018-12-01 DIAGNOSIS — Z8673 Personal history of transient ischemic attack (TIA), and cerebral infarction without residual deficits: Secondary | ICD-10-CM | POA: Insufficient documentation

## 2018-12-01 DIAGNOSIS — S52502A Unspecified fracture of the lower end of left radius, initial encounter for closed fracture: Secondary | ICD-10-CM | POA: Diagnosis present

## 2018-12-01 DIAGNOSIS — Z882 Allergy status to sulfonamides status: Secondary | ICD-10-CM | POA: Insufficient documentation

## 2018-12-01 DIAGNOSIS — Z7951 Long term (current) use of inhaled steroids: Secondary | ICD-10-CM | POA: Insufficient documentation

## 2018-12-01 DIAGNOSIS — Z7982 Long term (current) use of aspirin: Secondary | ICD-10-CM | POA: Diagnosis not present

## 2018-12-01 DIAGNOSIS — Z809 Family history of malignant neoplasm, unspecified: Secondary | ICD-10-CM | POA: Insufficient documentation

## 2018-12-01 DIAGNOSIS — Z818 Family history of other mental and behavioral disorders: Secondary | ICD-10-CM | POA: Diagnosis not present

## 2018-12-01 DIAGNOSIS — I421 Obstructive hypertrophic cardiomyopathy: Secondary | ICD-10-CM | POA: Diagnosis not present

## 2018-12-01 DIAGNOSIS — Z8674 Personal history of sudden cardiac arrest: Secondary | ICD-10-CM | POA: Diagnosis not present

## 2018-12-01 DIAGNOSIS — I110 Hypertensive heart disease with heart failure: Secondary | ICD-10-CM | POA: Insufficient documentation

## 2018-12-01 DIAGNOSIS — G8918 Other acute postprocedural pain: Secondary | ICD-10-CM | POA: Diagnosis not present

## 2018-12-01 DIAGNOSIS — S52612A Displaced fracture of left ulna styloid process, initial encounter for closed fracture: Secondary | ICD-10-CM | POA: Diagnosis not present

## 2018-12-01 DIAGNOSIS — W19XXXA Unspecified fall, initial encounter: Secondary | ICD-10-CM | POA: Diagnosis not present

## 2018-12-01 DIAGNOSIS — Z6841 Body Mass Index (BMI) 40.0 and over, adult: Secondary | ICD-10-CM | POA: Diagnosis not present

## 2018-12-01 DIAGNOSIS — Z90710 Acquired absence of both cervix and uterus: Secondary | ICD-10-CM | POA: Insufficient documentation

## 2018-12-01 DIAGNOSIS — Z79899 Other long term (current) drug therapy: Secondary | ICD-10-CM | POA: Insufficient documentation

## 2018-12-01 DIAGNOSIS — H3530 Unspecified macular degeneration: Secondary | ICD-10-CM | POA: Insufficient documentation

## 2018-12-01 DIAGNOSIS — I10 Essential (primary) hypertension: Secondary | ICD-10-CM | POA: Diagnosis not present

## 2018-12-01 DIAGNOSIS — Z87820 Personal history of traumatic brain injury: Secondary | ICD-10-CM | POA: Diagnosis not present

## 2018-12-01 DIAGNOSIS — Z8249 Family history of ischemic heart disease and other diseases of the circulatory system: Secondary | ICD-10-CM | POA: Diagnosis not present

## 2018-12-01 DIAGNOSIS — F319 Bipolar disorder, unspecified: Secondary | ICD-10-CM | POA: Insufficient documentation

## 2018-12-01 DIAGNOSIS — Z09 Encounter for follow-up examination after completed treatment for conditions other than malignant neoplasm: Secondary | ICD-10-CM

## 2018-12-01 DIAGNOSIS — S52611A Displaced fracture of right ulna styloid process, initial encounter for closed fracture: Secondary | ICD-10-CM | POA: Diagnosis not present

## 2018-12-01 DIAGNOSIS — E785 Hyperlipidemia, unspecified: Secondary | ICD-10-CM | POA: Diagnosis not present

## 2018-12-01 DIAGNOSIS — Z791 Long term (current) use of non-steroidal anti-inflammatories (NSAID): Secondary | ICD-10-CM | POA: Insufficient documentation

## 2018-12-01 HISTORY — DX: Unspecified fracture of the lower end of left radius, initial encounter for closed fracture: S52.502A

## 2018-12-01 HISTORY — DX: Dyspnea, unspecified: R06.00

## 2018-12-01 HISTORY — PX: OPEN REDUCTION INTERNAL FIXATION (ORIF) DISTAL RADIAL FRACTURE: SHX5989

## 2018-12-01 SURGERY — OPEN REDUCTION INTERNAL FIXATION (ORIF) DISTAL RADIUS FRACTURE
Anesthesia: General | Laterality: Left

## 2018-12-01 MED ORDER — CELECOXIB 100 MG PO CAPS: 100.0000 mg | ORAL_CAPSULE | Freq: Two times a day (BID) | ORAL | 2 refills | Status: DC

## 2018-12-01 MED ORDER — DOCUSATE SODIUM 100 MG PO CAPS
100.0000 mg | ORAL_CAPSULE | Freq: Two times a day (BID) | ORAL | Status: DC
  Administered 2018-12-01 – 2018-12-02 (×2): 100 mg via ORAL
  Filled 2018-12-01 (×2): qty 1

## 2018-12-01 MED ORDER — VANCOMYCIN HCL 1000 MG IV SOLR
INTRAVENOUS | Status: DC | PRN
  Administered 2018-12-01: 1000 mg

## 2018-12-01 MED ORDER — OXYCODONE HCL 5 MG PO TABS
5.0000 mg | ORAL_TABLET | ORAL | Status: DC | PRN
  Administered 2018-12-01 (×2): 5 mg via ORAL
  Administered 2018-12-02: 10 mg via ORAL
  Filled 2018-12-01 (×2): qty 1
  Filled 2018-12-01: qty 2

## 2018-12-01 MED ORDER — AMLODIPINE BESYLATE 5 MG PO TABS
5.0000 mg | ORAL_TABLET | Freq: Every day | ORAL | Status: DC
  Administered 2018-12-02: 5 mg via ORAL
  Filled 2018-12-01: qty 1

## 2018-12-01 MED ORDER — LIDOCAINE 2% (20 MG/ML) 5 ML SYRINGE
INTRAMUSCULAR | Status: DC | PRN
  Administered 2018-12-01: 50 mg via INTRAVENOUS

## 2018-12-01 MED ORDER — VENLAFAXINE HCL ER 75 MG PO CP24
75.0000 mg | ORAL_CAPSULE | Freq: Every day | ORAL | Status: DC
  Administered 2018-12-02: 75 mg via ORAL
  Filled 2018-12-01: qty 1

## 2018-12-01 MED ORDER — ATORVASTATIN CALCIUM 20 MG PO TABS: 20.0000 mg | ORAL_TABLET | Freq: Every day | ORAL | Status: DC

## 2018-12-01 MED ORDER — OXYCODONE HCL 5 MG PO TABS: ORAL_TABLET | ORAL | 0 refills | Status: AC

## 2018-12-01 MED ORDER — OXCARBAZEPINE 150 MG PO TABS
450.0000 mg | ORAL_TABLET | Freq: Two times a day (BID) | ORAL | Status: DC
  Administered 2018-12-01 – 2018-12-02 (×2): 450 mg via ORAL
  Filled 2018-12-01 (×2): qty 3

## 2018-12-01 MED ORDER — DEXTROSE 5 % IV SOLN
3.0000 g | INTRAVENOUS | Status: AC
  Administered 2018-12-01: 3 g via INTRAVENOUS
  Filled 2018-12-01: qty 3

## 2018-12-01 MED ORDER — CHLORHEXIDINE GLUCONATE 4 % EX LIQD: 60.0000 mL | Freq: Once | CUTANEOUS | Status: DC

## 2018-12-01 MED ORDER — VANCOMYCIN HCL 1000 MG IV SOLR
INTRAVENOUS | Status: AC
  Filled 2018-12-01: qty 1000

## 2018-12-01 MED ORDER — FENTANYL CITRATE (PF) 100 MCG/2ML IJ SOLN
100.0000 ug | Freq: Once | INTRAMUSCULAR | Status: AC
  Administered 2018-12-01: 50 ug via INTRAVENOUS
  Filled 2018-12-01: qty 2

## 2018-12-01 MED ORDER — FENTANYL CITRATE (PF) 100 MCG/2ML IJ SOLN
INTRAMUSCULAR | Status: AC
  Filled 2018-12-01: qty 2

## 2018-12-01 MED ORDER — FENTANYL CITRATE (PF) 100 MCG/2ML IJ SOLN: 25.0000 ug | INTRAMUSCULAR | Status: DC | PRN

## 2018-12-01 MED ORDER — FENTANYL CITRATE (PF) 100 MCG/2ML IJ SOLN
INTRAMUSCULAR | Status: DC | PRN
  Administered 2018-12-01 (×4): 25 ug via INTRAVENOUS

## 2018-12-01 MED ORDER — ALPRAZOLAM 0.25 MG PO TABS: 0.2500 mg | ORAL_TABLET | Freq: Every evening | ORAL | Status: DC | PRN

## 2018-12-01 MED ORDER — SUGAMMADEX SODIUM 500 MG/5ML IV SOLN
INTRAVENOUS | Status: AC
  Filled 2018-12-01: qty 5

## 2018-12-01 MED ORDER — CARVEDILOL 12.5 MG PO TABS
12.5000 mg | ORAL_TABLET | Freq: Two times a day (BID) | ORAL | Status: DC
  Administered 2018-12-02: 12.5 mg via ORAL
  Filled 2018-12-01: qty 1

## 2018-12-01 MED ORDER — LOSARTAN POTASSIUM 50 MG PO TABS
100.0000 mg | ORAL_TABLET | Freq: Every day | ORAL | Status: DC
  Administered 2018-12-01 – 2018-12-02 (×2): 100 mg via ORAL
  Filled 2018-12-01 (×2): qty 2

## 2018-12-01 MED ORDER — LORATADINE 10 MG PO TABS
10.0000 mg | ORAL_TABLET | Freq: Every day | ORAL | Status: DC
  Administered 2018-12-02: 10 mg via ORAL
  Filled 2018-12-01: qty 1

## 2018-12-01 MED ORDER — SODIUM CHLORIDE (PF) 0.9 % IJ SOLN
INTRAMUSCULAR | Status: DC | PRN
  Administered 2018-12-01: 500 mL via INTRAVENOUS

## 2018-12-01 MED ORDER — ADULT MULTIVITAMIN W/MINERALS CH
1.0000 | ORAL_TABLET | Freq: Every day | ORAL | Status: DC
  Administered 2018-12-01 – 2018-12-02 (×2): 1 via ORAL
  Filled 2018-12-01 (×2): qty 1

## 2018-12-01 MED ORDER — ONDANSETRON HCL 4 MG PO TABS: 4.0000 mg | ORAL_TABLET | Freq: Four times a day (QID) | ORAL | Status: DC | PRN

## 2018-12-01 MED ORDER — CEFAZOLIN SODIUM-DEXTROSE 2-4 GM/100ML-% IV SOLN
2.0000 g | Freq: Four times a day (QID) | INTRAVENOUS | Status: AC
  Administered 2018-12-01 – 2018-12-02 (×3): 2 g via INTRAVENOUS
  Filled 2018-12-01 (×3): qty 100

## 2018-12-01 MED ORDER — ASPIRIN EC 81 MG PO TBEC
81.0000 mg | DELAYED_RELEASE_TABLET | Freq: Every day | ORAL | Status: DC
  Administered 2018-12-02: 81 mg via ORAL
  Filled 2018-12-01: qty 1

## 2018-12-01 MED ORDER — PHENYLEPHRINE HCL-NACL 10-0.9 MG/250ML-% IV SOLN
INTRAVENOUS | Status: AC
  Filled 2018-12-01: qty 250

## 2018-12-01 MED ORDER — LACTATED RINGERS IV SOLN
INTRAVENOUS | Status: DC
  Administered 2018-12-01: 11:00:00 via INTRAVENOUS

## 2018-12-01 MED ORDER — OXYCODONE HCL 5 MG PO TABS: 5.0000 mg | ORAL_TABLET | Freq: Once | ORAL | Status: DC | PRN

## 2018-12-01 MED ORDER — METOPROLOL TARTRATE 5 MG/5ML IV SOLN
INTRAVENOUS | Status: AC
  Filled 2018-12-01: qty 5

## 2018-12-01 MED ORDER — HYDROMORPHONE HCL 1 MG/ML IJ SOLN: 0.5000 mg | INTRAMUSCULAR | Status: DC | PRN

## 2018-12-01 MED ORDER — FLUTICASONE PROPIONATE 50 MCG/ACT NA SUSP
1.0000 | Freq: Every day | NASAL | Status: DC
  Administered 2018-12-02: 1 via NASAL
  Filled 2018-12-01: qty 16

## 2018-12-01 MED ORDER — SODIUM CHLORIDE 0.9 % IV SOLN
INTRAVENOUS | Status: DC | PRN
  Administered 2018-12-01: 500 mL

## 2018-12-01 MED ORDER — VENLAFAXINE HCL ER 75 MG PO CP24
75.0000 mg | ORAL_CAPSULE | Freq: Once | ORAL | Status: AC
  Administered 2018-12-01: 75 mg via ORAL
  Filled 2018-12-01: qty 1

## 2018-12-01 MED ORDER — ACETAMINOPHEN 500 MG PO TABS: 1000.0000 mg | ORAL_TABLET | Freq: Three times a day (TID) | ORAL | 0 refills | Status: AC

## 2018-12-01 MED ORDER — CLOBETASOL PROPIONATE 0.05 % EX CREA
1.0000 "application " | TOPICAL_CREAM | Freq: Two times a day (BID) | CUTANEOUS | Status: DC
  Administered 2018-12-01 – 2018-12-02 (×2): 1 via TOPICAL
  Filled 2018-12-01: qty 15

## 2018-12-01 MED ORDER — SODIUM CHLORIDE 0.9 % IV SOLN
INTRAVENOUS | Status: DC | PRN
  Administered 2018-12-01: 13:00:00 20 ug/min via INTRAVENOUS

## 2018-12-01 MED ORDER — DEXAMETHASONE SODIUM PHOSPHATE 10 MG/ML IJ SOLN
INTRAMUSCULAR | Status: DC | PRN
  Administered 2018-12-01: 10 mg via INTRAVENOUS

## 2018-12-01 MED ORDER — FUROSEMIDE 20 MG PO TABS
20.0000 mg | ORAL_TABLET | Freq: Every day | ORAL | Status: DC
  Administered 2018-12-01 – 2018-12-02 (×2): 20 mg via ORAL
  Filled 2018-12-01 (×2): qty 1

## 2018-12-01 MED ORDER — BUPIVACAINE-EPINEPHRINE (PF) 0.5% -1:200000 IJ SOLN
INTRAMUSCULAR | Status: DC | PRN
  Administered 2018-12-01: 30 mL via PERINEURAL

## 2018-12-01 MED ORDER — ACETAMINOPHEN 500 MG PO TABS
1000.0000 mg | ORAL_TABLET | Freq: Four times a day (QID) | ORAL | Status: AC
  Administered 2018-12-01 – 2018-12-02 (×4): 1000 mg via ORAL
  Filled 2018-12-01 (×4): qty 2

## 2018-12-01 MED ORDER — FAMOTIDINE 20 MG PO TABS
20.0000 mg | ORAL_TABLET | Freq: Every day | ORAL | Status: DC
  Administered 2018-12-01: 20 mg via ORAL
  Filled 2018-12-01: qty 1

## 2018-12-01 MED ORDER — SODIUM CHLORIDE 0.9 % IV SOLN
INTRAVENOUS | Status: AC
  Filled 2018-12-01: qty 500000

## 2018-12-01 MED ORDER — PROSIGHT PO TABS
1.0000 | ORAL_TABLET | Freq: Every day | ORAL | Status: DC
  Administered 2018-12-01 – 2018-12-02 (×2): 1 via ORAL
  Filled 2018-12-01 (×2): qty 1

## 2018-12-01 MED ORDER — OXYCODONE HCL 5 MG/5ML PO SOLN: 5.0000 mg | Freq: Once | ORAL | Status: DC | PRN

## 2018-12-01 MED ORDER — MIDAZOLAM HCL 2 MG/2ML IJ SOLN
2.0000 mg | Freq: Once | INTRAMUSCULAR | Status: AC
  Administered 2018-12-01: 1 mg via INTRAVENOUS
  Filled 2018-12-01: qty 2

## 2018-12-01 MED ORDER — ONDANSETRON HCL 4 MG/2ML IJ SOLN
INTRAMUSCULAR | Status: DC | PRN
  Administered 2018-12-01: 4 mg via INTRAVENOUS

## 2018-12-01 MED ORDER — ONDANSETRON HCL 4 MG PO TABS: 4.0000 mg | ORAL_TABLET | Freq: Three times a day (TID) | ORAL | 1 refills | Status: AC | PRN

## 2018-12-01 MED ORDER — PROPOFOL 10 MG/ML IV BOLUS
INTRAVENOUS | Status: DC | PRN
  Administered 2018-12-01: 140 mg via INTRAVENOUS

## 2018-12-01 MED ORDER — ONDANSETRON HCL 4 MG/2ML IJ SOLN: 4.0000 mg | Freq: Four times a day (QID) | INTRAMUSCULAR | Status: DC | PRN

## 2018-12-01 SURGICAL SUPPLY — 64 items
BAG DECANTER FOR FLEXI CONT (MISCELLANEOUS) ×2 IMPLANT
BAG SPEC THK2 15X12 ZIP CLS (MISCELLANEOUS) ×1
BAG ZIPLOCK 12X15 (MISCELLANEOUS) ×2 IMPLANT
BANDAGE ACE 3X5.8 VEL STRL LF (GAUZE/BANDAGES/DRESSINGS) ×4 IMPLANT
BANDAGE ACE 4X5 VEL STRL LF (GAUZE/BANDAGES/DRESSINGS) ×2 IMPLANT
BIT DRILL 2.2 SS TIBIAL (BIT) ×2 IMPLANT
BNDG CMPR 9X4 STRL LF SNTH (GAUZE/BANDAGES/DRESSINGS) ×1
BNDG COHESIVE 6X5 TAN STRL LF (GAUZE/BANDAGES/DRESSINGS) ×2 IMPLANT
BNDG ESMARK 4X9 LF (GAUZE/BANDAGES/DRESSINGS) ×2 IMPLANT
COVER SURGICAL LIGHT HANDLE (MISCELLANEOUS) ×2 IMPLANT
COVER WAND RF STERILE (DRAPES) IMPLANT
CUFF TOURN SGL QUICK 18X4 (TOURNIQUET CUFF) ×2 IMPLANT
DRAPE C-ARM 42X120 X-RAY (DRAPES) ×2 IMPLANT
DRAPE OEC MINIVIEW 54X84 (DRAPES) ×2 IMPLANT
DRAPE U-SHAPE 47X51 STRL (DRAPES) ×2 IMPLANT
DRSG ADAPTIC 3X8 NADH LF (GAUZE/BANDAGES/DRESSINGS) ×2 IMPLANT
DRSG PAD ABDOMINAL 8X10 ST (GAUZE/BANDAGES/DRESSINGS) ×2 IMPLANT
DURAPREP 26ML APPLICATOR (WOUND CARE) ×2 IMPLANT
ELECT REM PT RETURN 15FT ADLT (MISCELLANEOUS) ×2 IMPLANT
GAUZE SPONGE 4X4 12PLY STRL (GAUZE/BANDAGES/DRESSINGS) ×2 IMPLANT
GLOVE BIOGEL PI IND STRL 8 (GLOVE) ×2 IMPLANT
GLOVE BIOGEL PI INDICATOR 8 (GLOVE) ×2
GLOVE ECLIPSE 8.0 STRL XLNG CF (GLOVE) ×2 IMPLANT
GLOVE SURG ORTHO 8.0 STRL STRW (GLOVE) ×2 IMPLANT
GOWN STRL REUS W/TWL XL LVL3 (GOWN DISPOSABLE) ×2 IMPLANT
K-WIRE 1.6 (WIRE) ×3
K-WIRE FX5X1.6XNS BN SS (WIRE) ×3
KIT BASIN OR (CUSTOM PROCEDURE TRAY) ×2 IMPLANT
KIT TURNOVER KIT A (KITS) IMPLANT
KWIRE FX5X1.6XNS BN SS (WIRE) ×3 IMPLANT
NS IRRIG 1000ML POUR BTL (IV SOLUTION) ×2 IMPLANT
PACK ORTHO EXTREMITY (CUSTOM PROCEDURE TRAY) ×2 IMPLANT
PAD CAST 3X4 CTTN HI CHSV (CAST SUPPLIES) ×3 IMPLANT
PAD CAST 4YDX4 CTTN HI CHSV (CAST SUPPLIES) ×1 IMPLANT
PADDING CAST COTTON 3X4 STRL (CAST SUPPLIES) ×6
PADDING CAST COTTON 4X4 STRL (CAST SUPPLIES) ×2
PEG LOCKING SMOOTH 2.2X14 (Peg) ×2 IMPLANT
PEG LOCKING SMOOTH 2.2X22 (Screw) ×8 IMPLANT
PEG LOCKING SMOOTH 2.2X24 (Peg) ×4 IMPLANT
PLATE NARROW DVR LEFT (Plate) ×2 IMPLANT
PROTECTOR NERVE ULNAR (MISCELLANEOUS) ×2 IMPLANT
SCREW LOCK 14X2.7X 3 LD TPR (Screw) ×2 IMPLANT
SCREW LOCK 22X2.7X 3 LD TPR (Screw) ×1 IMPLANT
SCREW LOCKING 2.7X14 (Screw) ×4 IMPLANT
SCREW LOCKING 2.7X15MM (Screw) ×2 IMPLANT
SCREW LOCKING 2.7X22MM (Screw) ×1 IMPLANT
SCREW NLOCK 24X2.7 3 LD (Screw) ×1 IMPLANT
SCREW NONLOCK 2.7X24 (Screw) ×2 IMPLANT
SLING ARM FOAM STRAP LRG (SOFTGOODS) ×2 IMPLANT
SPLINT PLASTER CAST XFAST 5X30 (CAST SUPPLIES) ×1 IMPLANT
SPLINT PLASTER XFAST SET 5X30 (CAST SUPPLIES) ×1
STRIP CLOSURE SKIN 1/2X4 (GAUZE/BANDAGES/DRESSINGS) ×2 IMPLANT
SUT MNCRL AB 4-0 PS2 18 (SUTURE) ×2 IMPLANT
SUT VIC AB 0 CT1 27 (SUTURE) ×4
SUT VIC AB 0 CT1 27XBRD ANTBC (SUTURE) ×2 IMPLANT
SUT VIC AB 2-0 CT1 27 (SUTURE) ×2
SUT VIC AB 2-0 CT1 TAPERPNT 27 (SUTURE) ×1 IMPLANT
SUT VIC AB 3-0 SH 27 (SUTURE) ×4
SUT VIC AB 3-0 SH 27X BRD (SUTURE) ×2 IMPLANT
TOWEL OR 17X26 10 PK STRL BLUE (TOWEL DISPOSABLE) ×4 IMPLANT
TOWEL OR NON WOVEN STRL DISP B (DISPOSABLE) ×2 IMPLANT
UNDERPAD 30X30 (UNDERPADS AND DIAPERS) ×2 IMPLANT
WATER STERILE IRR 1000ML POUR (IV SOLUTION) ×2 IMPLANT
YANKAUER SUCT BULB TIP 10FT TU (MISCELLANEOUS) ×2 IMPLANT

## 2018-12-01 NOTE — Anesthesia Procedure Notes (Addendum)
Anesthesia Regional Block: Interscalene brachial plexus block   Pre-Anesthetic Checklist: ,, timeout performed, Correct Patient, Correct Site, Correct Laterality, Correct Procedure, Correct Position, site marked, Risks and benefits discussed,  Surgical consent,  Pre-op evaluation,  At surgeon's request and post-op pain management  Laterality: Left  Prep: chloraprep       Needles:  Injection technique: Single-shot  Needle Type: Echogenic Stimulator Needle     Needle Length: 5cm  Needle Gauge: 22     Additional Needles:   Procedures:, nerve stimulator,,,,,,,   Nerve Stimulator or Paresthesia:  Response: biceps flexion, 0.45 mA,   Additional Responses:   Narrative:  Start time: 12/01/2018 11:28 AM End time: 12/01/2018 11:35 AM Injection made incrementally with aspirations every 5 mL.  Performed by: Personally  Anesthesiologist: Albertha Ghee, MD  Additional Notes: Functioning IV was confirmed and monitors were applied.  A 38mm 22ga Arrow echogenic stimulator needle was used. Sterile prep and drape,hand hygiene and sterile gloves were used.  Negative aspiration and negative test dose prior to incremental administration of local anesthetic. The patient tolerated the procedure well.  Ultrasound guidance: relevent anatomy identified, needle position confirmed, local anesthetic spread visualized around nerve(s), vascular puncture avoided.  Image printed for medical record.

## 2018-12-01 NOTE — Transfer of Care (Signed)
Immediate Anesthesia Transfer of Care Note  Patient: Marie Ramos  Procedure(s) Performed: Procedure(s): OPEN REDUCTION INTERNAL FIXATION (ORIF) LEFT DISTAL RADIAL FRACTURE (Left)  Patient Location: PACU  Anesthesia Type:GA combined with regional for post-op pain  Level of Consciousness:  sedated, patient cooperative and responds to stimulation  Airway & Oxygen Therapy:Patient Spontanous Breathing and Patient connected to face mask oxgen  Post-op Assessment:  Report given to PACU RN and Post -op Vital signs reviewed and stable  Post vital signs:  Reviewed and stable  Last Vitals:  Vitals:   12/01/18 1226 12/01/18 1227  BP:    Pulse: 82 81  Resp: (!) 21 (!) 23  Temp:    SpO2: 88% 11%    Complications: No apparent anesthesia complications

## 2018-12-01 NOTE — H&P (Signed)
PREOPERATIVE H&P  Chief Complaint: LEFT DISTAL RADIUS FRACTURE  HPI: Marie Ramos is a 63 y.o. female who presents for preoperative history and physical with a diagnosis of LEFT DISTAL RADIUS FRACTURE. Symptoms are rated as moderate to severe, and have been worsening.  This is significantly impairing activities of daily living.  Please see my clinic note for full details on this patient's care.  She has elected for surgical management.   Past Medical History:  Diagnosis Date  . Bipolar 1 disorder ([place])   . Cardiac arrest ([place]) 08/24/2015  . Chronically dry eyes   . Dyspnea    r/t seasonal allergies  . Hypertrophic obstructive cardiomyopathy ([place]) 08/29/2015  . Macular degeneration   . QT prolongation 08/29/2015  . Stroke ([place])   . TBI (traumatic brain injury) [facility]) 2011   Inpatient rehab [facility]  . Ventricular fibrillation ([place]) 09/03/15   MDT ICD Dr. Rayann Heman   Past Surgical History:  Procedure Laterality Date  . ABDOMINAL HYSTERECTOMY    . CARDIAC CATHETERIZATION N/A 08/24/2015   Procedure: Left Heart Cath and Coronary Angiography;  Surgeon: Sherren Mocha, MD;  Location: [place] [facility];  Service: Cardiovascular;  Laterality: N/A;  . EP IMPLANTABLE DEVICE N/A 09/03/2015   MDT dual chamber ICD  . SPLENECTOMY, TOTAL  2011  . TONSILLECTOMY     Social History   Socioeconomic History  . Marital status: Married    Spouse name: Not on file  . Number of children: 2  . Years of education: Not on file  . Highest education level: Master's degree (e.g., MA, MS, MEng, MEd, MSW, MBA)  Occupational History  . Occupation: substitute Product manager: [employer]  . Financial resource strain: Not hard at all  . Food insecurity:    Worry: Never true    Inability: Never true  . Transportation needs:    Medical: No    Non-medical: No  Tobacco Use  . Smoking status: Never Smoker  . Smokeless tobacco: Never Used  Substance and Sexual Activity  . Alcohol  use: No  . Drug use: No  . Sexual activity: Yes    Birth control/protection: Surgical  Lifestyle  . Physical activity:    Days per week: 0 days    Minutes per session: 0 min  . Stress: To some extent  Relationships  . Social connections:    Talks on phone: More than three times a week    Gets together: Once a week    Attends religious service: More than 4 times per year    Active member of club or organization: Yes    Attends meetings of clubs or organizations: More than 4 times per year    Relationship status: Married  Other Topics Concern  . Not on file  Social History Narrative   Married to [place] for 81 years 63st and only marriage.   2 kids, [name] is 86, and [name] is 50.      Has MBA in human resources but always wanted to teach.  Taught some business courses in past, but likes sub teaching.  High school or either pre-K.        From [place]. Moved here in 1997 b/c of husband's job at [employer].  Pt's son lives in [place]. Dtr lives in [place].  Has 1 granddaughter, 53 yo.      Grew up in home w/ both parents and sister.  Never abused.  Never legal issues except declared bankruptcy.      Caffeine 1 cup coffee. Or 1 coke a day.      Is Jewish.      Is on Disability for Mental health and heart issues, for 2 years.          Family History  Problem Relation Age of Onset  . Cancer Mother   . Depression Mother   . CAD Mother   . COPD Father   . Depression Father   . Diabetes Maternal Grandfather   . Depression Maternal Grandfather   . Arthritis/Rheumatoid Paternal Grandmother    Allergies  Allergen Reactions  . Sulfa Antibiotics Itching    Face neck red  . Naproxen Itching  . Vicodin [Hydrocodone-Acetaminophen] Itching   Prior to Admission medications   Medication Sig Start Date End Date Taking? Authorizing Provider  ALPRAZolam (XANAX) 0.25 MG tablet Take 1 tablet (0.25 mg total) by mouth at bedtime as needed for anxiety or sleep. 11/02/18  Yes  Donnal Moat T, PA-C  amLODipine (NORVASC) 5 MG tablet Take 1 tablet (5 mg total) by mouth daily with breakfast. 10/11/15  Yes Jamse Arn, MD  aspirin EC 81 MG tablet Take 81 mg by mouth daily.   Yes [provider]  atorvastatin (LIPITOR) 20 MG tablet Take 1 tablet (20 mg total) by mouth daily at 6 PM. 10/11/15  Yes Patel, Domenick Bookbinder, MD  carvedilol (COREG) 12.5 MG tablet Take one (1) tablet (12.5 mg) by mouth two (2) times daily with breakfast and dinner. 02/01/18  Yes Allred, Jeneen Rinks, MD  clobetasol (TEMOVATE) 0.05 % external solution Apply 1 application topically 2 (two) times daily.  10/20/18  Yes [provider]  famotidine (PEPCID) 20 MG tablet Take 20 mg by mouth at bedtime.   Yes [provider]  fexofenadine (ALLEGRA) 180 MG tablet Take 180 mg by mouth daily.   Yes [provider]  fluticasone (FLONASE) 50 MCG/ACT nasal spray Place 1 spray into both nostrils daily. Use as directed   Yes [provider]  furosemide (LASIX) 20 MG tablet TAKE ONE TABLET BY MOUTH ONE TIME DAILY  Patient taking differently: Take 20 mg by mouth daily.  09/14/17  Yes Allred, Jeneen Rinks, MD  HYDROcodone-acetaminophen (NORCO/VICODIN) 5-325 MG tablet Take 1 tablet by mouth every 6 (six) hours as needed for moderate pain.    Yes [provider]  ibuprofen (ADVIL) 200 MG tablet Take 800 mg by mouth every 6 (six) hours as needed.   Yes [provider]  losartan (COZAAR) 100 MG tablet Take 1 tablet (100 mg total) by mouth daily. 06/23/16  Yes Allred, Jeneen Rinks, MD  Multiple Vitamin (MULTIVITAMIN WITH MINERALS) TABS tablet Take 1 tablet by mouth daily.   Yes [provider]  multivitamin (PROSIGHT) TABS tablet Take 1 tablet by mouth daily. 09/13/15  Yes Love, Ivan Anchors, PA-C  Oxcarbazepine (TRILEPTAL) 300 MG tablet Take 1.5 tablets (450 mg total) by mouth 2 (two) times daily. 08/30/18  Yes Donnal Moat T, PA-C  traMADol (ULTRAM) 50 MG tablet Take 50 mg by mouth  daily as needed for moderate pain.  01/14/18  Yes [provider]  triamcinolone ointment (KENALOG) 0.5 % Apply 1 application topically 2 (two) times daily.  07/10/17  Yes [provider]  venlafaxine XR (EFFEXOR-XR) 75 MG 24 hr capsule Take 1 capsule (75 mg total) by mouth daily. 11/04/18  Yes Hurst, Helene Kelp T, PA-C  methocarbamol (ROBAXIN) 500 MG tablet TAKE ONE TABLET BY  MOUTH TWICE DAILY as needed for muscle spasms Patient not taking: Reported on 11/26/2018 01/06/18   Jamse Arn, MD     Positive ROS: All other systems have been reviewed and were otherwise negative with the exception of those mentioned in the HPI and as above.  Physical Exam: General: Alert, no acute distress Cardiovascular: No pedal edema Respiratory: No cyanosis, no use of accessory musculature GI: No organomegaly, abdomen is soft and non-tender Skin: No lesions in the area of chief complaint Neurologic: Sensation intact distally Psychiatric: Patient is competent for consent with normal mood and affect Lymphatic: No axillary or cervical lymphadenopathy  MUSCULOSKELETAL: L wrist in splint, block in place  Assessment: LEFT DISTAL RADIUS FRACTURE  Plan: Plan for Procedure(s): OPEN REDUCTION INTERNAL FIXATION (ORIF) LEFT DISTAL RADIAL FRACTURE  The risks benefits and alternatives were discussed with the patient including but not limited to the risks of nonoperative treatment, versus surgical intervention including infection, bleeding, nerve injury,  blood clots, cardiopulmonary complications, morbidity, mortality, among others, and they were willing to proceed.   Hiram Gash, MD  12/01/2018 12:30 PM

## 2018-12-01 NOTE — Progress Notes (Signed)
Assisted Dr. Hodierne with left, ultrasound guided, interscalene  block. Side rails up, monitors on throughout procedure. See vital signs in flow sheet. Tolerated Procedure well. 

## 2018-12-01 NOTE — Anesthesia Procedure Notes (Signed)
Procedure Name: LMA Insertion Date/Time: 12/01/2018 1:11 PM Performed by: Lavina Hamman, CRNA Pre-anesthesia Checklist: Patient identified, Emergency Drugs available, Suction available and Patient being monitored Patient Re-evaluated:Patient Re-evaluated prior to induction Oxygen Delivery Method: Circle System Utilized Preoxygenation: Pre-oxygenation with 100% oxygen Induction Type: IV induction Ventilation: Mask ventilation without difficulty LMA: LMA inserted LMA Size: 4.0 Number of attempts: 1 Airway Equipment and Method: Bite block Placement Confirmation: positive ETCO2 Tube secured with: Tape Dental Injury: Teeth and Oropharynx as per pre-operative assessment

## 2018-12-01 NOTE — Op Note (Signed)
Orthopaedic Surgery Operative Note (CSN: [id number])  Marie Ramos  05/08/1956 Date of Surgery: 12/01/2018   Diagnoses:  Left 4 part intra-articular distal radius fracture  Procedure: Left intra-articular distal radius fracture ORIF 4 fragments.   Operative Finding Successful completion of planned procedure.  Patient's bone quality was extraordinarily poor.  She had a significantly comminuted intra-articular distal radius fracture only the use of K wires as well as indirect reduction maneuvers were able to get near anatomic joint line.  We did not feel that additional fixation of the wrist would be necessary but we did feel that a long sugar tong splint would be protective.  She will likely need a cast postop.  Post-operative plan: The patient will be nonweightbearing in a sugar tong splint for now transition to a short arm cast at her first postop visit.  The patient will be discharged home.  DVT prophylaxis not indicated in isolated upper extremity surgery patient with no specific risks factors.  Pain control with PRN pain medication preferring oral medicines.  Follow up plan will be scheduled in approximately 7 days for incision check and XR.  Post-Op Diagnosis: Same Surgeons:Primary: Hiram Gash, MD Assistants:Brandon Lynnell Jude Location: [facility] ROOM 06 Anesthesia: General plus block Antibiotics: Ancef 2g preop, Vancomycin 1000mg  locally  Tourniquet time:  Total Tourniquet Time Documented: Upper Arm (Left) - 40 minutes Total: Upper Arm (Left) - 40 minutes  Estimated Blood Loss: minimal Complications: None Specimens: None Implants: Implant Name Type Inv. Item Serial No. Manufacturer Lot No. LRB No. Used  PLATE NARROW DVR LEFT - [id number] Plate PLATE NARROW DVR LEFT  [employer](ORTH,TRAU,BIO,SG)  Left 1  PEG LOCKING SMOOTH 2.2X22 - [id number] Screw PEG LOCKING SMOOTH 2.2X22  [employer](ORTH,TRAU,BIO,SG)  Left 4  PEG LOCKING SMOOTH 2.2X14 - [id number] Peg PEG LOCKING SMOOTH  2.2X14  [employer](ORTH,TRAU,BIO,SG)  Left 1  SCREW LOCKING 2.7X14 - [id number] Screw SCREW LOCKING 2.7X14  [employer](ORTH,TRAU,BIO,SG)  Left 2  SCREW LOCKING 2.7X15MM - [id number] Screw SCREW LOCKING 2.7X15MM  [employer](ORTH,TRAU,BIO,SG)  Left 1  SCREW LOCKING 2.7X22MM - [id number] Screw SCREW LOCKING 2.7X22MM  [employer](ORTH,TRAU,BIO,SG)  Left 1    Indications for Surgery:   Marie Ramos is a 63 y.o. female with multiple medical comorbidities including super morbid obesity with a BMI of 50 and a fall resulting in a significantly comminuted intra-articular distal radius fracture.  Due to the patient's significant comminution and intra-articular nature of her fracture we felt that operative fixation would be beneficial for her.  Benefits and risks of operative and nonoperative management were discussed prior to surgery with patient/guardian(s) and informed consent form was completed.  Specific risks including infection, need for additional surgery, loss of fixation, stiffness, hardware issues, nerve vessel injury.   Procedure:   The patient was identified in the preoperative holding area where the surgical site was marked. The patient was taken to the OR where a procedural timeout was called and the above noted anesthesia was induced.  The patient was positioned supine on a hand table.  Preoperative antibiotics were dosed.  The patient's left shoulder was prepped and draped in the usual sterile fashion.  A second preoperative timeout was called.      A tourniquet was used for the above listed time.   An FCR approach was made exposing the volar surface of the distal radius taking care to go through the sheath of the FCR tendon tract and ulnarly exposing the inferior portion of the sheath while protecting the  median nerve and radial artery on each side with blunt retractors.  This inferior portion of the sheath was incised sharply and examined for presence of the palmar cutaneous branch of  the median nerve.  It was determined to not be within the field and we carried our dissection deeply to the bone splitting the pronator quadratus and exposing the fracture site.    Appropriate reduction was obtained and a narrow standard length DVR Biomet plate was placed and checked for sizing and reduction under fluoroscopy.  This reduction was held in place with K wires and a K wire was placed into the radial styloid.    We had to use multiple indirect and direct reduction maneuvers to achieve an appropriate reduction.  Traction and ulnar deviation provided a near anatomic alignment of the joint and we are able to use K wires to hold this in place.  We felt that rafting of the articular surface with the locking pegs would be appropriate to hold the reduction and additional fixation dorsally would not be necessary even though there is significant dorsal comminution.  Significant morbidity did putting another plate in place that would need to be removed.  Once appropriate reduction was confirmed we then proceeded to fix the plate proximally and then proceeded to fill the distal holes with a combination of partially threaded screws and pegs.  At this point we checked our reduction to ensure that there was no intra-articular extension of our screws.  Once this was confirmed we proceeded to fill remaining 3 proximal shaft screws and obtained final images which demonstrated appropriate reduction and maintenance of alignment.  The DRUJ was checked and found to be stable.  We verified that all fast guides were removed on XR and through count.    The wound was thoroughly irrigated.  The tourniquet was released prior to skin closure to verify there was no excessive bleeding and we visualized that the radial artery and median nerve were intact at the end of the case. The PQ was reapproximated grossly prior to skin closure.    The incision was thoroughly irrigated and closed in a multilayer fashion with absorbable  sutures. A sterile dressing was placed.  A sugar tong splint was placed.  The patient was awoken from general anesthesia and taken to the PACU in stable condition without complication.   Joya Gaskins, OPA-C, present and scrubbed throughout the case, critical for completion in a timely fashion, and for retraction, instrumentation, closure.

## 2018-12-02 ENCOUNTER — Encounter (HOSPITAL_COMMUNITY): Payer: Self-pay | Admitting: Orthopaedic Surgery

## 2018-12-02 DIAGNOSIS — H3530 Unspecified macular degeneration: Secondary | ICD-10-CM | POA: Diagnosis not present

## 2018-12-02 DIAGNOSIS — S52572A Other intraarticular fracture of lower end of left radius, initial encounter for closed fracture: Secondary | ICD-10-CM | POA: Diagnosis not present

## 2018-12-02 DIAGNOSIS — I421 Obstructive hypertrophic cardiomyopathy: Secondary | ICD-10-CM | POA: Diagnosis not present

## 2018-12-02 DIAGNOSIS — Z791 Long term (current) use of non-steroidal anti-inflammatories (NSAID): Secondary | ICD-10-CM | POA: Diagnosis not present

## 2018-12-02 DIAGNOSIS — Z8674 Personal history of sudden cardiac arrest: Secondary | ICD-10-CM | POA: Diagnosis not present

## 2018-12-02 DIAGNOSIS — Z8673 Personal history of transient ischemic attack (TIA), and cerebral infarction without residual deficits: Secondary | ICD-10-CM | POA: Diagnosis not present

## 2018-12-02 DIAGNOSIS — F319 Bipolar disorder, unspecified: Secondary | ICD-10-CM | POA: Diagnosis not present

## 2018-12-02 DIAGNOSIS — Z87820 Personal history of traumatic brain injury: Secondary | ICD-10-CM | POA: Diagnosis not present

## 2018-12-02 NOTE — Discharge Summary (Signed)
Patient ID: Marie Ramos MRN: [id number] DOB/AGE: Apr 05, 1956 63 y.o.  Admit date: 12/01/2018 Discharge date: 12/02/2018  Admission Diagnoses:L intra-articular distal radius fracture  Discharge Diagnoses:  Active Problems:   Closed fracture of left distal radius   Past Medical History:  Diagnosis Date  . Bipolar 1 disorder ([place])   . Cardiac arrest ([place]) 08/24/2015  . Chronically dry eyes   . Dyspnea    r/t seasonal allergies  . Hypertrophic obstructive cardiomyopathy ([place]) 08/29/2015  . Macular degeneration   . QT prolongation 08/29/2015  . Stroke ([place])   . TBI (traumatic brain injury) [facility]) 2011   Inpatient rehab [facility]  . Ventricular fibrillation ([place]) 09/03/15   MDT ICD Dr. Rayann Heman     Procedures Performed: L distal radius ORIF  Discharged Condition: good  Hospital Course: Patient brought in as an outpatient for surgery.  Tolerated procedure well.  Was kept for monitoring overnight for pain control and medical monitoring as she was unable to wean from O2 likely due to her obesity and possible undiagnosed sleep apnea. postop and was found to be stable for DC home the morning after surgery.  Patient was instructed on specific activity restrictions and all questions were answered.   Consults: None  Significant Diagnostic Studies: No additional pertinent studies  Treatments: Surgery  Discharge Exam:  Splint CDI. Skin intact though cannot assess fully beneath splint. Nontender to palpation proximally, with full and painless ROM throughout hand with DPC of 0. + Motor in  AIN, PIN, Ulnar distributions. Sensation intact in medial, radial, and ulnar distributions. Well perfused digits.    Disposition: Discharge disposition: 01-Home or Self Care       Discharge Instructions    Call MD for:  persistant nausea and vomiting   Complete by:  As directed    Call MD for:  redness, tenderness, or signs of infection (pain, swelling, redness, odor or green/yellow discharge  around incision site)   Complete by:  As directed    Call MD for:  severe uncontrolled pain   Complete by:  As directed    Diet - low sodium heart healthy   Complete by:  As directed    Discharge instructions   Complete by:  As directed    Ophelia Charter MD, MPH [place]. [address] [phone] (tel)   [phone] (fax)   POST-OPERATIVE INSTRUCTIONS   WOUND CARE Please keep splint clean dry and intact until followup.  You may shower on Post-Op Day #2. You must keep splint dry during this process and may find that a plastic bag taped around the extremity or alternatively a towel based bath may be a better option.  If you get your splint wet or if it is damaged please contact our clinic.  EXERCISES Due to your splint being in place you will not be able to bear weight through your extremity.   Please use your sling until follow-up. Please continue to work on range of motion of your fingers and stretch these multiple times a day to prevent stiffness.  FOLLOW-UP If you develop a Fever (>101.5), Redness or Drainage from the surgical incision site, please call our office to arrange for an evaluation. Please call the office to schedule a follow-up appointment for your incision check if you do not already have one, 7-10 days post-operatively.  IF YOU HAVE ANY QUESTIONS, PLEASE FEEL FREE TO CALL OUR OFFICE.  HELPFUL INFORMATION  You should wean off your narcotic medicines as soon  as you are able.  Most patients will be off or using minimal narcotics before their first postop appointment.    You may be more comfortable sleeping in a semi-seated position the first few nights following surgery.  Keep a pillow propped under the elbow and forearm for comfort.  If you have a recliner type of chair it might be beneficial.    We suggest you use the pain medication the first night prior to going to bed, in order to ease any pain when the anesthesia wears off.  You should avoid taking pain medications on an empty stomach as it will make you nauseous.  Do not drink alcoholic beverages or take illicit drugs when taking pain medications.  In most states it is against the law to drive while your arm is in a sling. And certainly against the law to drive while taking narcotics.  You may return to work/school in the next couple of days when you feel up to it. Desk work and typing in the sling is fine.  When dressing, put your operative arm in the sleeve first.  When getting undressed, take your operative arm out last.  Loose fitting, button-down shirts are recommended.  Often in the first days after surgery you may be more comfortable keeping your operative arm under your shirt and not through the sleeve.  Pain medication may make you constipated.  Below are a few solutions to try in this order: Decrease the amount of pain medication if you aren't having pain. Drink lots of decaffeinated fluids. Drink prune juice and/or each dried prunes  If the first 3 don't work start with additional solutions Take Colace - an over-the-counter stool softener Take Senokot - an over-the-counter laxative Take Miralax - a stronger over-the-counter laxative   Increase activity slowly   Complete by:  As directed      Allergies as of 12/02/2018      Reactions   Sulfa Antibiotics Itching   Face neck red   Naproxen Itching   Vicodin [hydrocodone-acetaminophen] Itching      Medication List    STOP taking these medications   HYDROcodone-acetaminophen 5-325 MG tablet Commonly known as:  NORCO/VICODIN   ibuprofen 200 MG tablet Commonly known as:  ADVIL   methocarbamol 500 MG tablet Commonly known as:  ROBAXIN     TAKE these medications   acetaminophen 500 MG tablet Commonly known as:  TYLENOL Take 2 tablets (1,000 mg total) by mouth every 8 (eight) hours for 14 days.   ALPRAZolam 0.25 MG tablet Commonly known as:  XANAX Take 1 tablet (0.25 mg total) by mouth  at bedtime as needed for anxiety or sleep.   amLODipine 5 MG tablet Commonly known as:  NORVASC Take 1 tablet (5 mg total) by mouth daily with breakfast.   aspirin EC 81 MG tablet Take 81 mg by mouth daily.   atorvastatin 20 MG tablet Commonly known as:  LIPITOR Take 1 tablet (20 mg total) by mouth daily at 6 PM.   carvedilol 12.5 MG tablet Commonly known as:  COREG Take one (1) tablet (12.5 mg) by mouth two (2) times daily with breakfast and dinner.   celecoxib 100 MG capsule Commonly known as:  CeleBREX Take 1 capsule (100 mg total) by mouth 2 (two) times daily.   clobetasol 0.05 % external solution Commonly known as:  TEMOVATE Apply 1 application topically 2 (two) times daily.   famotidine 20 MG tablet Commonly known as:  PEPCID Take 20 mg by  mouth at bedtime.   fexofenadine 180 MG tablet Commonly known as:  ALLEGRA Take 180 mg by mouth daily.   fluticasone 50 MCG/ACT nasal spray Commonly known as:  FLONASE Place 1 spray into both nostrils daily. Use as directed   furosemide 20 MG tablet Commonly known as:  LASIX TAKE ONE TABLET BY MOUTH ONE TIME DAILY   losartan 100 MG tablet Commonly known as:  COZAAR Take 1 tablet (100 mg total) by mouth daily.   multivitamin Tabs tablet Take 1 tablet by mouth daily.   multivitamin with minerals Tabs tablet Take 1 tablet by mouth daily.   ondansetron 4 MG tablet Commonly known as:  Zofran Take 1 tablet (4 mg total) by mouth every 8 (eight) hours as needed for up to 7 days for nausea or vomiting.   Oxcarbazepine 300 MG tablet Commonly known as:  Trileptal Take 1.5 tablets (450 mg total) by mouth 2 (two) times daily.   oxyCODONE 5 MG immediate release tablet Commonly known as:  Oxy IR/ROXICODONE Take 1-2 pills every 6 hrs as needed for pain, no more than 6 per day   traMADol 50 MG tablet Commonly known as:  ULTRAM Take 50 mg by mouth daily as needed for moderate pain.   triamcinolone ointment 0.5 % Commonly  known as:  KENALOG Apply 1 application topically 2 (two) times daily.   venlafaxine XR 75 MG 24 hr capsule Commonly known as:  EFFEXOR-XR Take 1 capsule (75 mg total) by mouth daily.

## 2018-12-02 NOTE — Anesthesia Postprocedure Evaluation (Signed)
Anesthesia Post Note  Patient: Marie Ramos  Procedure(s) Performed: OPEN REDUCTION INTERNAL FIXATION (ORIF) LEFT DISTAL RADIAL FRACTURE (Left )     Patient location during evaluation: PACU Anesthesia Type: General Level of consciousness: awake and alert Pain management: pain level controlled Vital Signs Assessment: post-procedure vital signs reviewed and stable Respiratory status: spontaneous breathing, nonlabored ventilation and respiratory function stable Cardiovascular status: blood pressure returned to baseline and stable Postop Assessment: no apparent nausea or vomiting Anesthetic complications: no    Last Vitals:  Vitals:   12/02/18 0200 12/02/18 0607  BP: (!) 143/84 (!) 164/90  Pulse: 82 85  Resp: 18 18  Temp: 36.8 C   SpO2: 97% 96%    Last Pain:  Vitals:   12/02/18 0200  TempSrc: Oral  PainSc:                  Lynda Rainwater

## 2018-12-05 ENCOUNTER — Telehealth: Payer: Self-pay | Admitting: Nurse Practitioner

## 2018-12-05 NOTE — Telephone Encounter (Signed)
[facility] for AF - pt in AF on presenting rhythm.  V rates elevated at times. She does not have a history of AF.  Prior CVA. Will route to AF clinic and ask that they arrange for virtual office visit this week to discuss. She was previously on Eliquis for DVT.       Chanetta Marshall, NP 12/05/2018 9:39 AM

## 2018-12-06 ENCOUNTER — Ambulatory Visit (HOSPITAL_COMMUNITY)
Admission: RE | Admit: 2018-12-06 | Discharge: 2018-12-06 | Disposition: A | Discharge status: Home / Self Care | Payer: Medicare HMO | Source: Ambulatory Visit | Attending: Nurse Practitioner | Admitting: Nurse Practitioner

## 2018-12-06 ENCOUNTER — Other Ambulatory Visit: Payer: Self-pay

## 2018-12-06 DIAGNOSIS — I4891 Unspecified atrial fibrillation: Secondary | ICD-10-CM | POA: Diagnosis not present

## 2018-12-06 NOTE — Telephone Encounter (Signed)
Left message

## 2018-12-06 NOTE — Progress Notes (Signed)
Electrophysiology TeleHealth Note   Due to national recommendations of social distancing due to [place] 19, Audio/video  telehealth visit is felt to be most appropriate for this patient at this time.  See MyChart message/consent below  from today for patient consent regarding telehealth for the [facility].    Date:  12/06/2018   ID:  Marie Ramos, DOB [birth year], MRN [id number]  Location: home  Provider location: [address] [place], [place] [address] Evaluation Performed: New patient consult   PCP:  Lawerance Cruel, MD  Primary Cardiologist:   Dr. Rayann Heman Primary Electrophysiologist:Dr. Allred  CC:" I am not going to take blood thinners."   History of Present Illness: Marie Ramos is a 63 y.o. female who presents via audio/video conferencing for a telehealth visit today.   The patient is referred for new consultation regarding afib by Dr Allred/device clinic for afib showing on paceart for the last 2 days, not on anticoagulation.   Pt started the conversation saying her heart is fine and she ate lasagna last night and that is what caused it. She said she would not eat it again. As I tried to explain that it had been present for the last 2 days, she cut me off and said her husband has to push  her up the stairs to go to bed now. As I tried to explain that adding DOAC should not make it more difficult for her to get up the stairs,and having afib made her more at risk for stroke especially having a prior stroke, she replied that it didn't matter, that she was not taking it, I also heard her husband in the background telling her to say she was not going to take it period. She was not conducive for further conversation. She had  L distal radius ORIF 5/27.   she has a BMI of There is no height or weight on file to calculate BMI.. There were no vitals filed for this visit.  Past Medical History:  Diagnosis Date  . Bipolar 1 disorder ([place])   . Cardiac arrest  ([place]) 08/24/2015  . Chronically dry eyes   . Dyspnea    r/t seasonal allergies  . Hypertrophic obstructive cardiomyopathy ([place]) 08/29/2015  . Macular degeneration   . QT prolongation 08/29/2015  . Stroke ([place])   . TBI (traumatic brain injury) [facility]) 2011   Inpatient rehab [facility]  . Ventricular fibrillation ([place]) 09/03/15   MDT ICD Dr. Rayann Heman   Past Surgical History:  Procedure Laterality Date  . ABDOMINAL HYSTERECTOMY    . CARDIAC CATHETERIZATION N/A 08/24/2015   Procedure: Left Heart Cath and Coronary Angiography;  Surgeon: Sherren Mocha, MD;  Location: [facility];  Service: Cardiovascular;  Laterality: N/A;  . EP IMPLANTABLE DEVICE N/A 09/03/2015   MDT dual chamber ICD  . OPEN REDUCTION INTERNAL FIXATION (ORIF) DISTAL RADIAL FRACTURE Left 12/01/2018   Procedure: OPEN REDUCTION INTERNAL FIXATION (ORIF) LEFT DISTAL RADIAL FRACTURE;  Surgeon: Hiram Gash, MD;  Location: [facility];  Service: Orthopedics;  Laterality: Left;  . SPLENECTOMY, TOTAL  2011  . TONSILLECTOMY       Current Outpatient Medications  Medication Sig Dispense Refill  . acetaminophen (TYLENOL) 500 MG tablet Take 2 tablets (1,000 mg total) by mouth every 8 (eight) hours for 14 days. 84 tablet 0  . ALPRAZolam (XANAX) 0.25 MG tablet Take 1 tablet (0.25 mg total) by mouth at bedtime as needed for anxiety or sleep. 30 tablet 1  .  amLODipine (NORVASC) 5 MG tablet Take 1 tablet (5 mg total) by mouth daily with breakfast. 30 tablet 0  . aspirin EC 81 MG tablet Take 81 mg by mouth daily.    Marland Kitchen atorvastatin (LIPITOR) 20 MG tablet Take 1 tablet (20 mg total) by mouth daily at 6 PM. 30 tablet 0  . carvedilol (COREG) 12.5 MG tablet Take one (1) tablet (12.5 mg) by mouth two (2) times daily with breakfast and dinner. 180 tablet 3  . celecoxib (CELEBREX) 100 MG capsule Take 1 capsule (100 mg total) by mouth 2 (two) times daily. 60 capsule 2  . clobetasol (TEMOVATE) 0.05 % external solution Apply 1 application topically 2 (two) times  daily.     . famotidine (PEPCID) 20 MG tablet Take 20 mg by mouth at bedtime.    . fexofenadine (ALLEGRA) 180 MG tablet Take 180 mg by mouth daily.    . fluticasone (FLONASE) 50 MCG/ACT nasal spray Place 1 spray into both nostrils daily. Use as directed    . furosemide (LASIX) 20 MG tablet TAKE ONE TABLET BY MOUTH ONE TIME DAILY  (Patient taking differently: Take 20 mg by mouth daily. ) 90 tablet 2  . losartan (COZAAR) 100 MG tablet Take 1 tablet (100 mg total) by mouth daily. 90 tablet 3  . Multiple Vitamin (MULTIVITAMIN WITH MINERALS) TABS tablet Take 1 tablet by mouth daily.    . multivitamin (PROSIGHT) TABS tablet Take 1 tablet by mouth daily. 30 each 0  . ondansetron (ZOFRAN) 4 MG tablet Take 1 tablet (4 mg total) by mouth every 8 (eight) hours as needed for up to 7 days for nausea or vomiting. 10 tablet 1  . Oxcarbazepine (TRILEPTAL) 300 MG tablet Take 1.5 tablets (450 mg total) by mouth 2 (two) times daily. 270 tablet 0  . oxyCODONE (OXY IR/ROXICODONE) 5 MG immediate release tablet Take 1-2 pills every 6 hrs as needed for pain, no more than 6 per day 30 tablet 0  . traMADol (ULTRAM) 50 MG tablet Take 50 mg by mouth daily as needed for moderate pain.   0  . triamcinolone ointment (KENALOG) 0.5 % Apply 1 application topically 2 (two) times daily.   0  . venlafaxine XR (EFFEXOR-XR) 75 MG 24 hr capsule Take 1 capsule (75 mg total) by mouth daily. 90 capsule 1   No current facility-administered medications for this encounter.     Allergies:   Sulfa antibiotics; Naproxen; and Vicodin [hydrocodone-acetaminophen]   Social History:  The patient  reports that she has never smoked. She has never used smokeless tobacco. She reports that she does not drink alcohol or use drugs.   Family History:  The patient's   family history includes Arthritis/Rheumatoid in her paternal grandmother; CAD in her mother; COPD in her father; Cancer in her mother; Depression in her father, maternal grandfather, and  mother; Diabetes in her maternal grandfather.    ROS:  Please see the history of present illness.   All other systems are personally reviewed and negative.   Exam: Well appearing, alert and conversant, regular work of breathing,  good skin color   Recent Labs: 11/26/2018: BUN 41; Creatinine, Ser 1.20; Hemoglobin 11.6; Platelets 501; Potassium 3.9; Sodium 142  personally reviewed    Other studies personally reviewed: Epic records reviewefd   ASSESSMENT AND PLAN:  1.   atrial fibrillation x 2 days per paceart Pt is not anticoagulated  Unfortunately could not discuss much with pt as she had her mind made up  when I called to not take anticoagulation and would not let me discuss further re  risk of stroke without anticoagulation, especially with a CHA2DS2VASc score of 8.  Tried several times to discuss further but I was cut off. I let the device clinic know that she refused anticoagulation.   This patients CHA2DS2-VASc Score and unadjusted Ischemic Stroke Rate (% per year) is equal to 10.8 % stroke rate/year from a score of 8  Above score calculated as 1 point each if present [CHF, HTN, DM, Vascular=MI/PAD/Aortic Plaque, Age if 65-74, or Female] Above score calculated as 2 points each if present [Age > 75, or Stroke/TIA/TE]     Follow-up:  Per device clinic  Current medicines are reviewed at length with the patient today.   The patient does not have concerns regarding her medicines.  The following changes were made today:  none  Labs/ tests ordered today include:   No orders of the defined types were placed in this encounter.   Patient Risk:  after full review of this patients clinical status, I feel that they are at hgh risk at this time.   Today, I have spent 5 minutes with the patient with telehealth technology discussing afib and 5 minutes ahead of time reviewing epic notes.Eduard Roux NP  12/06/2018 3:20 PM  Afib [facility] [address] [place], [place] [address] [phone]  I hereby voluntarily request, consent and authorize the [facility] and its employed or contracted physicians, physician assistants, nurse practitioners or other licensed health care professionals (the Practitioner), to provide me with telemedicine health care services (the "Services") as deemed necessary by the treating Practitioner. I acknowledge and consent to receive the Services by the Practitioner via telemedicine. I understand that the telemedicine visit will involve communicating with the Practitioner through live audiovisual communication technology and the disclosure of certain medical information by electronic transmission. I acknowledge that I have been given the opportunity to request an in-person assessment or other available alternative prior to the telemedicine visit and am voluntarily participating in the telemedicine visit.   I understand that I have the right to withhold or withdraw my consent to the use of telemedicine in the course of my care at any time, without affecting my right to future care or treatment, and that the Practitioner or I may terminate the telemedicine visit at any time. I understand that I have the right to inspect all information obtained and/or recorded in the course of the telemedicine visit and may receive copies of available information for a reasonable fee.  I understand that some of the potential risks of receiving the Services via telemedicine include:   Delay or interruption in medical evaluation due to technological equipment failure or disruption;  Information transmitted may not be sufficient (e.g. poor resolution of images) to allow for appropriate medical decision making by the Practitioner; and/or  In rare instances, security protocols could fail, causing a breach of personal health information.   Furthermore, I acknowledge that it is my responsibility to provide information about my  medical history, conditions and care that is complete and accurate to the best of my ability. I acknowledge that Practitioner's advice, recommendations, and/or decision may be based on factors not within their control, such as incomplete or inaccurate data provided by me or distortions of diagnostic images or specimens that may result from electronic transmissions. I understand that the practice of medicine is not an exact science and that  Practitioner makes no warranties or guarantees regarding treatment outcomes. I acknowledge that I will receive a copy of this consent concurrently upon execution via email to the email address I last provided but may also request a printed copy by calling the office of the [facility].  I understand that my insurance will be billed for this visit.   I have read or had this consent read to me.  I understand the contents of this consent, which adequately explains the benefits and risks of the Services being provided via telemedicine.  I have been provided ample opportunity to ask questions regarding this consent and the Services and have had my questions answered to my satisfaction.  I give my informed consent for the services to be provided through the use of telemedicine in my medical care  By participating in this telemedicine visit I agree to the above.

## 2018-12-07 DIAGNOSIS — S52502D Unspecified fracture of the lower end of left radius, subsequent encounter for closed fracture with routine healing: Secondary | ICD-10-CM | POA: Diagnosis not present

## 2018-12-21 ENCOUNTER — Ambulatory Visit (INDEPENDENT_AMBULATORY_CARE_PROVIDER_SITE_OTHER): Payer: Medicare HMO

## 2018-12-21 DIAGNOSIS — Z95810 Presence of automatic (implantable) cardiac defibrillator: Secondary | ICD-10-CM

## 2018-12-21 DIAGNOSIS — L304 Erythema intertrigo: Secondary | ICD-10-CM | POA: Diagnosis not present

## 2018-12-21 DIAGNOSIS — I5032 Chronic diastolic (congestive) heart failure: Secondary | ICD-10-CM | POA: Diagnosis not present

## 2018-12-22 NOTE — Progress Notes (Signed)
EPIC Encounter for ICM Monitoring  Patient Name: Marie Ramos is a 63 y.o. female Date: 12/22/2018 Primary Care Physican: Lawerance Cruel, MD Primary Cardiologist: Allred Electrophysiologist: Allred Last Weight:255lbs 6/17/2020Weight: 254lbs  Clinical Status (03-Dec-2018 to 21-Dec-2018)  AT/AF                  2 episodes  Time in AT/AF    <0.1 hr/day (0.1%)  Longest AT/AF     39 minutes  Observations (2) (03-Dec-2018 to 21-Dec-2018)  Alert: AT/AF >= 0.5 hr for 1 days  Patient Activity less than 1 hr/day for 1 weeks.  Spoke with patient and she reports she is doing fine and denied fluid symptoms.  New onset of Afib on 12/05/2018 [facility] report and was referred to [facility].  Per Afib clinic note, pt declined medication for Afib and was not receptive to Afib clinic call.   Discussed Afib and she declined anticoagulation and understands she is at risk for a stroke.  She made it very clear she declines anticoagulation medication and will take her chances.  Thoracic impedance trending above baseline normal suggesting possible dryness.  Prescribed:Furosemide 20 mg 1 tablet daily  Labs: 01/09/2017 Creatinine 0.88, BUN 24, Potassium 4.5, Sodium 145, GFR 71-82  Recommendations:  Advised to limit salt and fluid intake.   Follow-up plan: ICM clinic phone appointment on 01/24/2019.   Copy of ICM check sent to Dr.Allred.  3 month ICM trend: 12/21/2018   1 Year ICM trend:       [name], RN 12/22/2018 9:00 AM

## 2018-12-23 DIAGNOSIS — B373 Candidiasis of vulva and vagina: Secondary | ICD-10-CM | POA: Diagnosis not present

## 2018-12-29 ENCOUNTER — Telehealth: Payer: Self-pay | Admitting: Physician Assistant

## 2018-12-29 NOTE — Telephone Encounter (Signed)
I haven't seen her in 2 months and we didn't change the Trileptal dose. I don't think it would be that.  Have her see her PCP or cardiologist about it.

## 2018-12-29 NOTE — Telephone Encounter (Signed)
Dub Mikes called to ask if she should be taking trileptal.  Her feet are swelling and is concerned this med may be the cause.  Please call to discuss.  [phone].  No appt scheduled, due back late Oct.

## 2018-12-30 NOTE — Telephone Encounter (Signed)
Left detailed information on personal voicemail, instructed to call back with concerns or questions.

## 2019-01-10 ENCOUNTER — Other Ambulatory Visit: Payer: Self-pay | Admitting: Physician Assistant

## 2019-01-11 DIAGNOSIS — S52502D Unspecified fracture of the lower end of left radius, subsequent encounter for closed fracture with routine healing: Secondary | ICD-10-CM | POA: Diagnosis not present

## 2019-01-11 NOTE — Telephone Encounter (Signed)
Ok to submit next follow up 04/2019

## 2019-01-13 DIAGNOSIS — M25632 Stiffness of left wrist, not elsewhere classified: Secondary | ICD-10-CM | POA: Diagnosis not present

## 2019-01-13 DIAGNOSIS — R601 Generalized edema: Secondary | ICD-10-CM | POA: Diagnosis not present

## 2019-01-13 DIAGNOSIS — M25532 Pain in left wrist: Secondary | ICD-10-CM | POA: Diagnosis not present

## 2019-01-13 DIAGNOSIS — S6732XA Crushing injury of left wrist, initial encounter: Secondary | ICD-10-CM | POA: Diagnosis not present

## 2019-01-13 DIAGNOSIS — M6281 Muscle weakness (generalized): Secondary | ICD-10-CM | POA: Diagnosis not present

## 2019-01-13 DIAGNOSIS — R202 Paresthesia of skin: Secondary | ICD-10-CM | POA: Diagnosis not present

## 2019-01-13 DIAGNOSIS — W108XXA Fall (on) (from) other stairs and steps, initial encounter: Secondary | ICD-10-CM | POA: Diagnosis not present

## 2019-01-20 DIAGNOSIS — R202 Paresthesia of skin: Secondary | ICD-10-CM | POA: Diagnosis not present

## 2019-01-20 DIAGNOSIS — S6732XA Crushing injury of left wrist, initial encounter: Secondary | ICD-10-CM | POA: Diagnosis not present

## 2019-01-20 DIAGNOSIS — W108XXA Fall (on) (from) other stairs and steps, initial encounter: Secondary | ICD-10-CM | POA: Diagnosis not present

## 2019-01-20 DIAGNOSIS — M25532 Pain in left wrist: Secondary | ICD-10-CM | POA: Diagnosis not present

## 2019-01-20 DIAGNOSIS — M6281 Muscle weakness (generalized): Secondary | ICD-10-CM | POA: Diagnosis not present

## 2019-01-20 DIAGNOSIS — M25632 Stiffness of left wrist, not elsewhere classified: Secondary | ICD-10-CM | POA: Diagnosis not present

## 2019-01-20 DIAGNOSIS — R601 Generalized edema: Secondary | ICD-10-CM | POA: Diagnosis not present

## 2019-01-24 ENCOUNTER — Ambulatory Visit (INDEPENDENT_AMBULATORY_CARE_PROVIDER_SITE_OTHER): Payer: Medicare HMO

## 2019-01-24 DIAGNOSIS — Z95810 Presence of automatic (implantable) cardiac defibrillator: Secondary | ICD-10-CM | POA: Diagnosis not present

## 2019-01-24 DIAGNOSIS — I5032 Chronic diastolic (congestive) heart failure: Secondary | ICD-10-CM | POA: Diagnosis not present

## 2019-01-24 NOTE — Progress Notes (Signed)
EPIC Encounter for ICM Monitoring  Patient Name: Marie Ramos is a 63 y.o. female Date: 01/24/2019 Primary Care Physican: Lawerance Cruel, MD Primary Cardiologist: Allred Electrophysiologist: Allred 6/17/2020Weight: 254lbs 01/24/2019 Weight: 252 lbs  Spoke with patient and she reports she is asymptomtic.  She has been eating a lot of TV dinners and drinking a lot of fluids.   It has been difficult to cook since she broke both of her wrists but she is starting to cook more now.         Explained TV dinners are high in sodium and review sodium levels amounts.   Optivol thoracic impedance suggesting possible ongoing fluid accumulation since 12/30/2018.  Prescribed:Furosemide 20 mg 1 tablet daily  Labs: 01/09/2017 Creatinine 0.88, BUN 24, Potassium 4.5, Sodium 145, GFR 71-82  Recommendations: Advised to increase Furosemide to 40 mg daily x 3 days and then return to 20 mg daily.  Advised to limit salt to 2000 mg daily and fluid intake to 64 oz daily.   Follow-up plan: ICM clinic phone appointment on8/10/2018 to recheck fluid levels.   Copy of ICM check sent to Dr.Allred.  3 month ICM trend: 01/24/2019    1 Year ICM trend:       [name], RN 01/24/2019 1:49 PM

## 2019-01-25 DIAGNOSIS — R202 Paresthesia of skin: Secondary | ICD-10-CM | POA: Diagnosis not present

## 2019-01-25 DIAGNOSIS — R601 Generalized edema: Secondary | ICD-10-CM | POA: Diagnosis not present

## 2019-01-25 DIAGNOSIS — M25632 Stiffness of left wrist, not elsewhere classified: Secondary | ICD-10-CM | POA: Diagnosis not present

## 2019-01-25 DIAGNOSIS — S6732XA Crushing injury of left wrist, initial encounter: Secondary | ICD-10-CM | POA: Diagnosis not present

## 2019-01-25 DIAGNOSIS — W108XXA Fall (on) (from) other stairs and steps, initial encounter: Secondary | ICD-10-CM | POA: Diagnosis not present

## 2019-01-25 DIAGNOSIS — M6281 Muscle weakness (generalized): Secondary | ICD-10-CM | POA: Diagnosis not present

## 2019-01-25 DIAGNOSIS — M25532 Pain in left wrist: Secondary | ICD-10-CM | POA: Diagnosis not present

## 2019-01-26 DIAGNOSIS — M25632 Stiffness of left wrist, not elsewhere classified: Secondary | ICD-10-CM | POA: Diagnosis not present

## 2019-01-26 DIAGNOSIS — W108XXA Fall (on) (from) other stairs and steps, initial encounter: Secondary | ICD-10-CM | POA: Diagnosis not present

## 2019-01-26 DIAGNOSIS — R601 Generalized edema: Secondary | ICD-10-CM | POA: Diagnosis not present

## 2019-01-26 DIAGNOSIS — S6732XA Crushing injury of left wrist, initial encounter: Secondary | ICD-10-CM | POA: Diagnosis not present

## 2019-01-26 DIAGNOSIS — M6281 Muscle weakness (generalized): Secondary | ICD-10-CM | POA: Diagnosis not present

## 2019-01-26 DIAGNOSIS — M25532 Pain in left wrist: Secondary | ICD-10-CM | POA: Diagnosis not present

## 2019-01-26 DIAGNOSIS — R202 Paresthesia of skin: Secondary | ICD-10-CM | POA: Diagnosis not present

## 2019-02-02 DIAGNOSIS — M25532 Pain in left wrist: Secondary | ICD-10-CM | POA: Diagnosis not present

## 2019-02-02 DIAGNOSIS — M25632 Stiffness of left wrist, not elsewhere classified: Secondary | ICD-10-CM | POA: Diagnosis not present

## 2019-02-02 DIAGNOSIS — M6281 Muscle weakness (generalized): Secondary | ICD-10-CM | POA: Diagnosis not present

## 2019-02-02 DIAGNOSIS — R202 Paresthesia of skin: Secondary | ICD-10-CM | POA: Diagnosis not present

## 2019-02-02 DIAGNOSIS — W108XXA Fall (on) (from) other stairs and steps, initial encounter: Secondary | ICD-10-CM | POA: Diagnosis not present

## 2019-02-02 DIAGNOSIS — R601 Generalized edema: Secondary | ICD-10-CM | POA: Diagnosis not present

## 2019-02-02 DIAGNOSIS — S6732XA Crushing injury of left wrist, initial encounter: Secondary | ICD-10-CM | POA: Diagnosis not present

## 2019-02-07 ENCOUNTER — Other Ambulatory Visit: Payer: Self-pay

## 2019-02-07 ENCOUNTER — Telehealth: Payer: Self-pay | Admitting: Physician Assistant

## 2019-02-07 MED ORDER — OXCARBAZEPINE 300 MG PO TABS: 450.0000 mg | ORAL_TABLET | Freq: Two times a day (BID) | ORAL | 0 refills | Status: DC

## 2019-02-07 NOTE — Telephone Encounter (Signed)
Refill sent.

## 2019-02-07 NOTE — Telephone Encounter (Signed)
Pt needs refill on Trileptal sent to [facility] on [address].

## 2019-02-08 ENCOUNTER — Ambulatory Visit (INDEPENDENT_AMBULATORY_CARE_PROVIDER_SITE_OTHER): Payer: Medicare HMO

## 2019-02-08 DIAGNOSIS — I5032 Chronic diastolic (congestive) heart failure: Secondary | ICD-10-CM

## 2019-02-08 DIAGNOSIS — Z79899 Other long term (current) drug therapy: Secondary | ICD-10-CM | POA: Diagnosis not present

## 2019-02-08 DIAGNOSIS — L400 Psoriasis vulgaris: Secondary | ICD-10-CM | POA: Diagnosis not present

## 2019-02-08 DIAGNOSIS — L821 Other seborrheic keratosis: Secondary | ICD-10-CM | POA: Diagnosis not present

## 2019-02-08 DIAGNOSIS — D225 Melanocytic nevi of trunk: Secondary | ICD-10-CM | POA: Diagnosis not present

## 2019-02-08 DIAGNOSIS — Z95810 Presence of automatic (implantable) cardiac defibrillator: Secondary | ICD-10-CM

## 2019-02-09 ENCOUNTER — Telehealth: Payer: Self-pay

## 2019-02-09 NOTE — Progress Notes (Signed)
EPIC Encounter for ICM Monitoring  Patient Name: Marie Ramos is a 63 y.o. female Date: 02/09/2019 Primary Care Physican: Lawerance Cruel, MD Primary Cardiologist: Allred Electrophysiologist: Allred 01/24/2019 Weight: 252 lbs 02/09/2019 Weight: 260 lbs  Spoke with patient and she reportsshe is asymptomtic.  Weight is up 8 lbs since 7/20 and no change to thoracic impedance after taking 3 days of extra Furosemide 7-20-7/23. She has been trying to cut back on sodium.     Optivol thoracic impedance suggesting possible ongoing fluid accumulation since 12/30/2018.  Fluid index > normal threshold since 01/18/2019.  Prescribed:Furosemide 20 mg 1 tablet daily  Labs: 11/26/2018 Creatinine 1.20, BUN 41, Potassium 3.9, Sodium 142, GFR 48-56  Recommendations:Patient took extra Furosemide tablet x 3 days from 7/20-7/23 without a change in Optivol thoracic impedance.   Follow-up plan: ICM clinic phone appointment on8/13/2020 to recheck fluid levels.  Message sent to scheduler to call patient to schedule EP appointment (yearly due 01/2019)  Copy of ICM check sent to [facility] and phone note sent to Dr Lovena Le since he is covering for Dr Rayann Heman because he is out of the office this week.    3 month ICM trend: 02/08/2019    1 Year ICM trend:       [name], RN 02/09/2019 12:07 PM

## 2019-02-09 NOTE — Telephone Encounter (Signed)
ICM call to patient.    Sx: Weight is up 8 lbs since taking 3 days of an extra 20 mg of Furosemide 7/20-7/23 and no Optivol improvement.      Optivol thoracic impedance suggesting possible ongoing fluid accumulation since 12/30/2018.  Fluid index > normal threshold since 01/18/2019.  She confirms she takes Furosemide 20 mg 1 tablet daily  Labs: 11/26/2018 Creatinine 1.20, BUN 41, Potassium 3.9, Sodium 142, GFR 48-56  Follow-up plan: ICM clinic phone appointment on8/13/2020 to recheck fluid levels.   Sent to Dr Lovena Le for review and recommendations if needed since Dr Rayann Heman is out of the office this week.    3 month ICM trend: 02/08/2019

## 2019-02-10 DIAGNOSIS — R202 Paresthesia of skin: Secondary | ICD-10-CM | POA: Diagnosis not present

## 2019-02-10 DIAGNOSIS — M25532 Pain in left wrist: Secondary | ICD-10-CM | POA: Diagnosis not present

## 2019-02-10 DIAGNOSIS — R601 Generalized edema: Secondary | ICD-10-CM | POA: Diagnosis not present

## 2019-02-10 DIAGNOSIS — S6732XA Crushing injury of left wrist, initial encounter: Secondary | ICD-10-CM | POA: Diagnosis not present

## 2019-02-10 DIAGNOSIS — M25632 Stiffness of left wrist, not elsewhere classified: Secondary | ICD-10-CM | POA: Diagnosis not present

## 2019-02-10 DIAGNOSIS — M6281 Muscle weakness (generalized): Secondary | ICD-10-CM | POA: Diagnosis not present

## 2019-02-10 DIAGNOSIS — W108XXA Fall (on) (from) other stairs and steps, initial encounter: Secondary | ICD-10-CM | POA: Diagnosis not present

## 2019-02-11 NOTE — Progress Notes (Signed)
Dr Tanna Furry nurse, Willeen Cass, RN routed phone note to Dr Rayann Heman.

## 2019-02-15 DIAGNOSIS — L400 Psoriasis vulgaris: Secondary | ICD-10-CM | POA: Diagnosis not present

## 2019-02-15 NOTE — Telephone Encounter (Signed)
Fluid level recheck scheduled for 02/17/2019.

## 2019-02-15 NOTE — Progress Notes (Signed)
Fluid level recheck scheduled 02/17/2019.

## 2019-02-17 ENCOUNTER — Ambulatory Visit (INDEPENDENT_AMBULATORY_CARE_PROVIDER_SITE_OTHER): Payer: Medicare HMO | Admitting: *Deleted

## 2019-02-17 ENCOUNTER — Ambulatory Visit (INDEPENDENT_AMBULATORY_CARE_PROVIDER_SITE_OTHER): Payer: Medicare HMO

## 2019-02-17 DIAGNOSIS — I5032 Chronic diastolic (congestive) heart failure: Secondary | ICD-10-CM

## 2019-02-17 DIAGNOSIS — S6732XA Crushing injury of left wrist, initial encounter: Secondary | ICD-10-CM | POA: Diagnosis not present

## 2019-02-17 DIAGNOSIS — Z95810 Presence of automatic (implantable) cardiac defibrillator: Secondary | ICD-10-CM

## 2019-02-17 DIAGNOSIS — R202 Paresthesia of skin: Secondary | ICD-10-CM | POA: Diagnosis not present

## 2019-02-17 DIAGNOSIS — I429 Cardiomyopathy, unspecified: Secondary | ICD-10-CM

## 2019-02-17 DIAGNOSIS — M25632 Stiffness of left wrist, not elsewhere classified: Secondary | ICD-10-CM | POA: Diagnosis not present

## 2019-02-17 DIAGNOSIS — M6281 Muscle weakness (generalized): Secondary | ICD-10-CM | POA: Diagnosis not present

## 2019-02-17 DIAGNOSIS — W108XXA Fall (on) (from) other stairs and steps, initial encounter: Secondary | ICD-10-CM | POA: Diagnosis not present

## 2019-02-17 DIAGNOSIS — M25532 Pain in left wrist: Secondary | ICD-10-CM | POA: Diagnosis not present

## 2019-02-17 DIAGNOSIS — R601 Generalized edema: Secondary | ICD-10-CM | POA: Diagnosis not present

## 2019-02-17 LAB — CUP PACEART REMOTE DEVICE CHECK
Battery Remaining Longevity: 90 mo
Battery Voltage: 2.99 V
Brady Statistic AP VP Percent: 0.01 %
Brady Statistic AP VS Percent: 1.39 %
Brady Statistic AS VP Percent: 0.06 %
Brady Statistic AS VS Percent: 98.54 %
Brady Statistic RA Percent Paced: 1.4 %
Brady Statistic RV Percent Paced: 0.07 %
Date Time Interrogation Session: 20200813041603
HighPow Impedance: 87 Ohm
Implantable Lead Implant Date: 20170227
Implantable Lead Implant Date: 20170227
Implantable Lead Location: 753859
Implantable Lead Location: 753860
Implantable Lead Model: 5076
Implantable Pulse Generator Implant Date: 20170227
Lead Channel Impedance Value: 399 Ohm
Lead Channel Impedance Value: 399 Ohm
Lead Channel Impedance Value: 513 Ohm
Lead Channel Pacing Threshold Amplitude: 0.5 V
Lead Channel Pacing Threshold Amplitude: 0.5 V
Lead Channel Pacing Threshold Pulse Width: 0.4 ms
Lead Channel Pacing Threshold Pulse Width: 0.4 ms
Lead Channel Sensing Intrinsic Amplitude: 20.5 mV
Lead Channel Sensing Intrinsic Amplitude: 3.125 mV
Lead Channel Setting Pacing Amplitude: 2 V
Lead Channel Setting Pacing Amplitude: 2.5 V
Lead Channel Setting Pacing Pulse Width: 0.4 ms
Lead Channel Setting Sensing Sensitivity: 0.3 mV

## 2019-02-18 DIAGNOSIS — M25632 Stiffness of left wrist, not elsewhere classified: Secondary | ICD-10-CM | POA: Diagnosis not present

## 2019-02-18 DIAGNOSIS — R202 Paresthesia of skin: Secondary | ICD-10-CM | POA: Diagnosis not present

## 2019-02-18 DIAGNOSIS — S6732XA Crushing injury of left wrist, initial encounter: Secondary | ICD-10-CM | POA: Diagnosis not present

## 2019-02-18 DIAGNOSIS — W108XXA Fall (on) (from) other stairs and steps, initial encounter: Secondary | ICD-10-CM | POA: Diagnosis not present

## 2019-02-18 DIAGNOSIS — R601 Generalized edema: Secondary | ICD-10-CM | POA: Diagnosis not present

## 2019-02-18 DIAGNOSIS — M6281 Muscle weakness (generalized): Secondary | ICD-10-CM | POA: Diagnosis not present

## 2019-02-18 DIAGNOSIS — M25532 Pain in left wrist: Secondary | ICD-10-CM | POA: Diagnosis not present

## 2019-02-18 NOTE — Progress Notes (Signed)
EPIC Encounter for ICM Monitoring  Patient Name: Marie Ramos is a 63 y.o. female Date: 02/18/2019 Primary Care Physican: Lawerance Cruel, MD Primary Cardiologist: Allred Electrophysiologist: Allred 01/24/2019 Weight:252lbs 02/09/2019 Weight: 260 lbs 02/18/2019 Weight: 251 lbs  Spoke with patient and she reportsshesaid weight dropped down to baseline.She said she decreased fluid intake.  Optivol thoracic impedance returned to normal since 02/08/2019 remote transmission  Prescribed:Furosemide 20 mg 1 tablet daily  Labs: 11/26/2018 Creatinine 1.20, BUN 41, Potassium 3.9, Sodium 142, GFR 48-56  Recommendations:No changes and encouraged to call if experiencing any fluid symptoms.  Follow-up plan: ICM clinic phone appointment on10/11/2018.  Office defib check scheduled 03/02/2019 with Tommye Standard, PA.  Copy of ICM check sent to Dr.Allred    3 month ICM trend: 02/17/2019    1 Year ICM trend:       [name], RN 02/18/2019 1:11 PM

## 2019-02-22 DIAGNOSIS — S52502D Unspecified fracture of the lower end of left radius, subsequent encounter for closed fracture with routine healing: Secondary | ICD-10-CM | POA: Diagnosis not present

## 2019-02-24 DIAGNOSIS — M6281 Muscle weakness (generalized): Secondary | ICD-10-CM | POA: Diagnosis not present

## 2019-02-24 DIAGNOSIS — R601 Generalized edema: Secondary | ICD-10-CM | POA: Diagnosis not present

## 2019-02-24 DIAGNOSIS — R202 Paresthesia of skin: Secondary | ICD-10-CM | POA: Diagnosis not present

## 2019-02-24 DIAGNOSIS — W108XXA Fall (on) (from) other stairs and steps, initial encounter: Secondary | ICD-10-CM | POA: Diagnosis not present

## 2019-02-24 DIAGNOSIS — M25632 Stiffness of left wrist, not elsewhere classified: Secondary | ICD-10-CM | POA: Diagnosis not present

## 2019-02-24 DIAGNOSIS — M25532 Pain in left wrist: Secondary | ICD-10-CM | POA: Diagnosis not present

## 2019-02-24 DIAGNOSIS — S6732XA Crushing injury of left wrist, initial encounter: Secondary | ICD-10-CM | POA: Diagnosis not present

## 2019-02-25 DIAGNOSIS — M25532 Pain in left wrist: Secondary | ICD-10-CM | POA: Diagnosis not present

## 2019-02-25 DIAGNOSIS — W108XXA Fall (on) (from) other stairs and steps, initial encounter: Secondary | ICD-10-CM | POA: Diagnosis not present

## 2019-02-25 DIAGNOSIS — M25632 Stiffness of left wrist, not elsewhere classified: Secondary | ICD-10-CM | POA: Diagnosis not present

## 2019-02-25 DIAGNOSIS — R202 Paresthesia of skin: Secondary | ICD-10-CM | POA: Diagnosis not present

## 2019-02-25 DIAGNOSIS — M6281 Muscle weakness (generalized): Secondary | ICD-10-CM | POA: Diagnosis not present

## 2019-02-25 DIAGNOSIS — R601 Generalized edema: Secondary | ICD-10-CM | POA: Diagnosis not present

## 2019-02-25 DIAGNOSIS — S6732XA Crushing injury of left wrist, initial encounter: Secondary | ICD-10-CM | POA: Diagnosis not present

## 2019-02-27 NOTE — Progress Notes (Signed)
Patient ID: Marie Ramos, female   DOB: 08-May-1956, 63 y.o.   MRN: [id number]    Cardiology Office Note Date:  02/27/2019  Patient ID:  Marie Ramos, Marie Ramos 11/01/1955, MRN [id number] PCP:  Lawerance Cruel, MD  Cardiologist:  None prior to hospital, (new) Dr. Burt Knack Electrophysiologist: Dr. Rayann Heman    Chief Complaint: routine EP visit   History of Present Illness: Marie Ramos is a 63 y.o. female with history of cardiac arrest 08/24/15, she was at work, AED was immediately applied and recommended and received 2 shocks and CPR, FR found her in PEA with ROSC after epinephrine, very recently found to have AFib via remote device check.   08/24/15: presumd VF arrest with AED advising shocks, CPR, PEA , she had emergent cardiac cath with no CAD, long QT on her EKG noted. Hypokalemic noted as well.   She underwent hypothermia protocol.  Suspected her psych meds were the etiology pf her QT prolongation.  Echo suggested possible HCM,, MRI revealed hypertensive CM, not HCM.  MRI 2/26 noted CVA, neuro on case, possibly related to cath procedure, not recommended to have TEE or further w/u.  She underwent ICD implant  By Dr. Rayann Heman on 09/04/15.  Psychiatry was consulted and aided in her medications, to avoid QT prolonging meds. Discharged from CIR on the current regime. She developed DVT to LLE started on Eliquis.  She was discharged to CIR until 09/14/15 and has undergone out patient rehab since then.  Is now off a/c  She comes in today being seen for Dr. Rayann Heman, she was last seen by him in Jan 2019, was doing well, no changes were made to her device or therapy, planned for 6 mo f/u with APPs.    ShI saw her in July 2019, she was doing well.  Very active caring for her grandchild left her tired at the end of the day and sleeping well.  No CP, palpitations or SOB, no dizziness, near syncope or syncope.  No shocks.  Med list was reviewed with RPH and confirmed no QT prolonging drugs.  No changes were  made.  12/06/2018 she was referred to the AFib clinic with a remote PPM check noting 2 days of AFib.  Marie Greenland, NP had a tele-health visit though quite unsuccessful in keeping the patient on topic and despite efforts in discussing Afib, embolic risks and [place], the patient (and her husband in the background) were unwilling to discuss.    She is doing well.  No CP, palpitations or SOB, no near syncope or syncope, no shocks.  She fell at her daughter's house (trip/fall) and broker her wrist requiring surgicl repair.  She follows with L. Short, RN and is back on track with her fluid/sdoium intake.  She recalls being called about getting started on a blood thinner and not wanting to.    Device information: MDT dual chamber ICD implanted 09/03/15, Dr. Rayann Heman, secondary prevention/VF arrest  Past Medical History:  Diagnosis Date   Bipolar 1 disorder ([place])    Cardiac arrest ([place]) 08/24/2015   Chronically dry eyes    Dyspnea    r/t seasonal allergies   Hypertrophic obstructive cardiomyopathy ([place]) 08/29/2015   Macular degeneration    QT prolongation 08/29/2015   Stroke [facility])    TBI (traumatic brain injury) [facility]) 2011   Inpatient rehab [facility]   Ventricular fibrillation ([place]) 09/03/15   MDT ICD Dr. Rayann Heman    Past Surgical History:  Procedure Laterality Date   ABDOMINAL  HYSTERECTOMY     CARDIAC CATHETERIZATION N/A 08/24/2015   Procedure: Left Heart Cath and Coronary Angiography;  Surgeon: Sherren Mocha, MD;  Location: [facility];  Service: Cardiovascular;  Laterality: N/A;   EP IMPLANTABLE DEVICE N/A 09/03/2015   MDT dual chamber ICD   OPEN REDUCTION INTERNAL FIXATION (ORIF) DISTAL RADIAL FRACTURE Left 12/01/2018   Procedure: OPEN REDUCTION INTERNAL FIXATION (ORIF) LEFT DISTAL RADIAL FRACTURE;  Surgeon: Hiram Gash, MD;  Location: [facility] ORS;  Service: Orthopedics;  Laterality: Left;   SPLENECTOMY, TOTAL  2011   TONSILLECTOMY      Current Outpatient Medications  Medication  Sig Dispense Refill   ALPRAZolam (XANAX) 0.25 MG tablet TAKE ONE TABLET BY MOUTH DAILY AT BEDTIME AS NEEDED FOR SLEEP OR ANXIETY 30 tablet 2   amLODipine (NORVASC) 5 MG tablet Take 1 tablet (5 mg total) by mouth daily with breakfast. 30 tablet 0   aspirin EC 81 MG tablet Take 81 mg by mouth daily.     atorvastatin (LIPITOR) 20 MG tablet Take 1 tablet (20 mg total) by mouth daily at 6 PM. 30 tablet 0   carvedilol (COREG) 12.5 MG tablet Take one (1) tablet (12.5 mg) by mouth two (2) times daily with breakfast and dinner. 180 tablet 3   celecoxib (CELEBREX) 100 MG capsule Take 1 capsule (100 mg total) by mouth 2 (two) times daily. 60 capsule 2   clobetasol (TEMOVATE) 0.05 % external solution Apply 1 application topically 2 (two) times daily.      famotidine (PEPCID) 20 MG tablet Take 20 mg by mouth at bedtime.     fexofenadine (ALLEGRA) 180 MG tablet Take 180 mg by mouth daily.     fluticasone (FLONASE) 50 MCG/ACT nasal spray Place 1 spray into both nostrils daily. Use as directed     furosemide (LASIX) 20 MG tablet TAKE ONE TABLET BY MOUTH ONE TIME DAILY  (Patient taking differently: Take 20 mg by mouth daily. ) 90 tablet 2   losartan (COZAAR) 100 MG tablet Take 1 tablet (100 mg total) by mouth daily. 90 tablet 3   Multiple Vitamin (MULTIVITAMIN WITH MINERALS) TABS tablet Take 1 tablet by mouth daily.     multivitamin (PROSIGHT) TABS tablet Take 1 tablet by mouth daily. 30 each 0   Oxcarbazepine (TRILEPTAL) 300 MG tablet Take 1.5 tablets (450 mg total) by mouth 2 (two) times daily. 270 tablet 0   traMADol (ULTRAM) 50 MG tablet Take 50 mg by mouth daily as needed for moderate pain.   0   triamcinolone ointment (KENALOG) 0.5 % Apply 1 application topically 2 (two) times daily.   0   venlafaxine XR (EFFEXOR-XR) 75 MG 24 hr capsule Take 1 capsule (75 mg total) by mouth daily. 90 capsule 1   No current facility-administered medications for this visit.     Allergies:   Sulfa  antibiotics, Naproxen, and Vicodin [hydrocodone-acetaminophen]   Social History:  The patient  reports that she has never smoked. She has never used smokeless tobacco. She reports that she does not drink alcohol or use drugs.   Family History:  The patient's family history includes Arthritis/Rheumatoid in her paternal grandmother; CAD in her mother; COPD in her father; Cancer in her mother; Depression in her father, maternal grandfather, and mother; Diabetes in her maternal grandfather.  ROS:  Please see the history of present illness.    All other systems are reviewed and otherwise negative.   PHYSICAL EXAM:  VS:  There were no vitals  taken for this visit. BMI: There is no height or weight on file to calculate BMI. Well nourished, well developed, in no acute distress  HEENT: normocephalic, atraumatic  Neck: no JVD, carotid bruits or masses Cardiac: RRR; no significant murmurs, no rubs, or gallops Lungs:  CTA b/l , no wheezing, rhonchi or rales  Abd: soft, nontender, central obesity MS: no deformity or atrophy Ext:   no edema  Skin: warm and dry, no rash Neuro:  No gross deficits appreciated Psych: euthymic mood, full affect   ICD site is stable,well healed, no tethering or discomfort   EKG:  Done today and reviewed by myself:  SR 72, 1st degree [place], PR 265ms, QTc 451ms ICD interrogation today and reviewed by myself.  Battery and lead measurements are good No V arrhythmias no therapies She had 3 AF episodes 12/01/18 34 seconds  (day of her surgery) 5/29 had 2 episodes 38 minutes and 21 minutes   07/14/16: TTE Study Conclusions - Left ventricle: The cavity size was normal. Wall thickness was   increased in a pattern of mild LVH. Systolic function was normal.   The estimated ejection fraction was in the range of 55% to 60%.   Wall motion was normal; there were no regional wall motion   abnormalities. Doppler parameters are consistent with abnormal   left ventricular  relaxation (grade 1 diastolic dysfunction). - Aortic valve: There was no stenosis. - Mitral valve: Mildly calcified annulus. There was no significant   regurgitation. - Right ventricle: The cavity size was normal. Pacer wire or   catheter noted in right ventricle. Systolic function was normal. - Pulmonary arteries: No complete TR doppler jet so unable to   estimate PA systolic pressure. - Inferior vena cava: The vessel was normal in size. The   respirophasic diameter changes were in the normal range (= 50%),   consistent with normal central venous pressure. Impressions: - Normal LV size with mild LV hypertrophy. EF 55-60%. Normal RV   size and systolic function. No significant valvular   abnormalities.  08/24/15: Echocardiogram:  Study Conclusions - Left ventricle: The cavity size was normal. Systolic function was  mildly to moderately reduced. The estimated ejection fraction was  in the range of 40% to 45%. There is severe asymmetric septal  hypetrophy without dynamic obstruction. Findings suggestive of  hypertrophic nonobstructive cardiomyopathy. Diffuse hypokinesis.  Doppler parameters are consistent with abnormal left ventricular  relaxation (grade 1 diastolic dysfunction). Septal thickness, ED  (2D): 17 mm. Posterior wall thickness, ED (2D): 10.5 mm. - Technically difficult study. Impressions: - Technically difficult study, however appears to have asymmetric  septal hypertrophic cardiomyoapathy without obstruction. In  setting of VF arrest, would consider cardiac MRI to better  evaluate as this could be the potential etiology of her  arrhythmia.  08/24/15: LHC Conclusion    Widely patent coronary arteries Normal LVEDP       Recent Labs: 11/26/2018: BUN 41; Creatinine, Ser 1.20; Hemoglobin 11.6; Platelets 501; Potassium 3.9; Sodium 142  No results found for requested labs within last 8760 hours.   CrCl cannot be calculated (Patient's most recent lab  result is older than the maximum 21 days allowed.).   Wt Readings from Last 3 Encounters:  11/26/18 270 lb 2 oz (122.5 kg)  01/15/18 265 lb (120.2 kg)  07/27/17 272 lb (123.4 kg)     Other studies reviewed: Additional studies/records reviewed today include: summarized above    ASSESSMENT AND PLAN:  1. VF arrest, QT prolongation felt  secondary to psych meds 2. ICD     meds reviewed with RPH, OK     QT is OK today       3. Bipolar     Continue f/u with psych     Avoid QT prolonging medications and caution with antipsychotics in the futr      She is aware  4. HTN 5. hypertensive CM, chronic CHF (disatolic)     On BB/ARB tx     Had fluid OL that has corrected      No symptoms or exam findings to suggest fluid OL weight is down      Back on track with low salt diet  6. AFib    CHA2DS2Vasc is 1 for female,      she does have prior stroke, though she reports this was in the environment of her cardiac arrest, notes report suspect 2/2 LHC procedure     Her Afib episodes occurred the day after she was discharged after her wrist surgery and she reports she was in significant pain once home     None prior or since     We discussed embolic/stroke risk, role for anticoagulation     She remains most comfortable holding off on a/c though if we see further would be more open to the idea           Disposition:  Continue Q 70month remotes.  Initially I had recommended annual EP follow up, though have staff messaged our scheduler to call the patient, better for q 6 months given her history.    Current medicines are reviewed at length with the patient today.  The patient did not have any concerns regarding medicines.  Marie Lasso, PA-C 02/27/2019 5:43 PM     [address] [place] [address] [phone] (office)  [phone] (fax)

## 2019-02-28 ENCOUNTER — Encounter: Payer: Self-pay | Admitting: Cardiology

## 2019-02-28 NOTE — Progress Notes (Signed)
Remote ICD transmission.   

## 2019-03-02 ENCOUNTER — Encounter: Payer: Self-pay | Admitting: Physician Assistant

## 2019-03-02 ENCOUNTER — Ambulatory Visit (INDEPENDENT_AMBULATORY_CARE_PROVIDER_SITE_OTHER): Payer: Medicare HMO | Admitting: Physician Assistant

## 2019-03-02 ENCOUNTER — Other Ambulatory Visit: Payer: Self-pay

## 2019-03-02 VITALS — BP 148/82 | HR 72 | Ht 62.0 in | Wt 261.0 lb

## 2019-03-02 DIAGNOSIS — I480 Paroxysmal atrial fibrillation: Secondary | ICD-10-CM | POA: Diagnosis not present

## 2019-03-02 DIAGNOSIS — I4891 Unspecified atrial fibrillation: Secondary | ICD-10-CM | POA: Diagnosis not present

## 2019-03-02 DIAGNOSIS — I110 Hypertensive heart disease with heart failure: Secondary | ICD-10-CM | POA: Diagnosis not present

## 2019-03-02 DIAGNOSIS — F319 Bipolar disorder, unspecified: Secondary | ICD-10-CM | POA: Diagnosis not present

## 2019-03-02 DIAGNOSIS — Z95810 Presence of automatic (implantable) cardiac defibrillator: Secondary | ICD-10-CM

## 2019-03-02 DIAGNOSIS — I10 Essential (primary) hypertension: Secondary | ICD-10-CM

## 2019-03-02 DIAGNOSIS — I5032 Chronic diastolic (congestive) heart failure: Secondary | ICD-10-CM | POA: Diagnosis not present

## 2019-03-02 NOTE — Patient Instructions (Signed)
Medication Instructions:   Your physician recommends that you continue on your current medications as directed. Please refer to the Current Medication list given to you today.  If you need a refill on your cardiac medications before your next appointment, please call your pharmacy.   Lab work:NONE ORDERED  TODAY   If you have labs (blood work) drawn today and your tests are completely normal, you will receive your results only by: . MyChart Message (if you have MyChart) OR . A paper copy in the mail If you have any lab test that is abnormal or we need to change your treatment, we will call you to review the results.  Testing/Procedures:NONE ORDERED  TODAY   Follow-Up: At [facility], you and your health needs are our priority.  As part of our continuing mission to provide you with exceptional heart care, we have created designated Provider Care Teams.  These Care Teams include your primary Cardiologist (physician) and Advanced Practice Providers (APPs -  Physician Assistants and Nurse Practitioners) who all work together to provide you with the care you need, when you need it. You will need a follow up appointment in 1 years.  Please call our office 2 months in advance to schedule this appointment.  You may see James Allred, MD or one of the following Advanced Practice Providers on your designated Care Team:  Renee Ursuy, PA-C  Any Other Special Instructions Will Be Listed Below (If Applicable).     

## 2019-03-03 DIAGNOSIS — R202 Paresthesia of skin: Secondary | ICD-10-CM | POA: Diagnosis not present

## 2019-03-03 DIAGNOSIS — W108XXA Fall (on) (from) other stairs and steps, initial encounter: Secondary | ICD-10-CM | POA: Diagnosis not present

## 2019-03-03 DIAGNOSIS — M6281 Muscle weakness (generalized): Secondary | ICD-10-CM | POA: Diagnosis not present

## 2019-03-03 DIAGNOSIS — R601 Generalized edema: Secondary | ICD-10-CM | POA: Diagnosis not present

## 2019-03-03 DIAGNOSIS — S6732XA Crushing injury of left wrist, initial encounter: Secondary | ICD-10-CM | POA: Diagnosis not present

## 2019-03-03 DIAGNOSIS — M25632 Stiffness of left wrist, not elsewhere classified: Secondary | ICD-10-CM | POA: Diagnosis not present

## 2019-03-03 DIAGNOSIS — M25532 Pain in left wrist: Secondary | ICD-10-CM | POA: Diagnosis not present

## 2019-03-09 DIAGNOSIS — S6732XA Crushing injury of left wrist, initial encounter: Secondary | ICD-10-CM | POA: Diagnosis not present

## 2019-03-09 DIAGNOSIS — M6281 Muscle weakness (generalized): Secondary | ICD-10-CM | POA: Diagnosis not present

## 2019-03-09 DIAGNOSIS — W108XXA Fall (on) (from) other stairs and steps, initial encounter: Secondary | ICD-10-CM | POA: Diagnosis not present

## 2019-03-09 DIAGNOSIS — R601 Generalized edema: Secondary | ICD-10-CM | POA: Diagnosis not present

## 2019-03-09 DIAGNOSIS — M25532 Pain in left wrist: Secondary | ICD-10-CM | POA: Diagnosis not present

## 2019-03-09 DIAGNOSIS — R202 Paresthesia of skin: Secondary | ICD-10-CM | POA: Diagnosis not present

## 2019-03-09 DIAGNOSIS — M25632 Stiffness of left wrist, not elsewhere classified: Secondary | ICD-10-CM | POA: Diagnosis not present

## 2019-03-10 DIAGNOSIS — W108XXA Fall (on) (from) other stairs and steps, initial encounter: Secondary | ICD-10-CM | POA: Diagnosis not present

## 2019-03-10 DIAGNOSIS — M6281 Muscle weakness (generalized): Secondary | ICD-10-CM | POA: Diagnosis not present

## 2019-03-10 DIAGNOSIS — R202 Paresthesia of skin: Secondary | ICD-10-CM | POA: Diagnosis not present

## 2019-03-10 DIAGNOSIS — R601 Generalized edema: Secondary | ICD-10-CM | POA: Diagnosis not present

## 2019-03-10 DIAGNOSIS — M25532 Pain in left wrist: Secondary | ICD-10-CM | POA: Diagnosis not present

## 2019-03-10 DIAGNOSIS — M25632 Stiffness of left wrist, not elsewhere classified: Secondary | ICD-10-CM | POA: Diagnosis not present

## 2019-03-10 DIAGNOSIS — S6732XA Crushing injury of left wrist, initial encounter: Secondary | ICD-10-CM | POA: Diagnosis not present

## 2019-03-15 DIAGNOSIS — M25632 Stiffness of left wrist, not elsewhere classified: Secondary | ICD-10-CM | POA: Diagnosis not present

## 2019-03-15 DIAGNOSIS — M25532 Pain in left wrist: Secondary | ICD-10-CM | POA: Diagnosis not present

## 2019-03-15 DIAGNOSIS — R202 Paresthesia of skin: Secondary | ICD-10-CM | POA: Diagnosis not present

## 2019-03-15 DIAGNOSIS — R601 Generalized edema: Secondary | ICD-10-CM | POA: Diagnosis not present

## 2019-03-15 DIAGNOSIS — S6732XA Crushing injury of left wrist, initial encounter: Secondary | ICD-10-CM | POA: Diagnosis not present

## 2019-03-15 DIAGNOSIS — M6281 Muscle weakness (generalized): Secondary | ICD-10-CM | POA: Diagnosis not present

## 2019-03-15 DIAGNOSIS — W108XXA Fall (on) (from) other stairs and steps, initial encounter: Secondary | ICD-10-CM | POA: Diagnosis not present

## 2019-04-08 ENCOUNTER — Telehealth: Payer: Self-pay | Admitting: Physician Assistant

## 2019-04-08 ENCOUNTER — Other Ambulatory Visit: Payer: Self-pay | Admitting: Physician Assistant

## 2019-04-08 MED ORDER — ALPRAZOLAM 0.25 MG PO TABS: ORAL_TABLET | ORAL | 2 refills | Status: DC

## 2019-04-08 NOTE — Telephone Encounter (Signed)
Patient called and is out of her xanax and pharmacy said that they sent it to [name] twice. Patient has an appointment 10/30. Please send it in to the [facility] on [address]

## 2019-04-08 NOTE — Telephone Encounter (Signed)
Rx was sent in. 

## 2019-04-11 ENCOUNTER — Ambulatory Visit (INDEPENDENT_AMBULATORY_CARE_PROVIDER_SITE_OTHER): Payer: Medicare HMO

## 2019-04-11 DIAGNOSIS — Z95810 Presence of automatic (implantable) cardiac defibrillator: Secondary | ICD-10-CM | POA: Diagnosis not present

## 2019-04-11 DIAGNOSIS — I5032 Chronic diastolic (congestive) heart failure: Secondary | ICD-10-CM

## 2019-04-12 NOTE — Progress Notes (Signed)
EPIC Encounter for ICM Monitoring  Patient Name: Marie Ramos is a 63 y.o. female Date: 04/12/2019 Primary Care Physican: Lawerance Cruel, MD Primary Cardiologist: Allred Electrophysiologist: Allred 04/12/2019 Weight: 248 lbs  Spoke with patient and she reportssheis feeling fine.  Denies fluid symptoms  Optivol thoracic impedance normal   Prescribed:Furosemide 20 mg 1 tablet daily  Labs: 11/26/2018 Creatinine 1.20, BUN 41, Potassium 3.9, Sodium 142, GFR 48-56  Recommendations:  Encouraged to call if experiencing fluid symptoms.  Follow-up plan: ICM clinic phone appointment on 05/20/2019.   91 day device clinic remote transmission 05/19/2019.    Copy of ICM check sent to Dr. Rayann Heman.   3 month ICM trend: 04/11/2019    1 Year ICM trend:       [name], RN 04/12/2019 1:05 PM

## 2019-04-29 DIAGNOSIS — Z23 Encounter for immunization: Secondary | ICD-10-CM | POA: Diagnosis not present

## 2019-05-06 ENCOUNTER — Ambulatory Visit: Payer: Medicare HMO | Admitting: Physician Assistant

## 2019-05-08 ENCOUNTER — Other Ambulatory Visit: Payer: Self-pay | Admitting: Internal Medicine

## 2019-05-19 ENCOUNTER — Ambulatory Visit (INDEPENDENT_AMBULATORY_CARE_PROVIDER_SITE_OTHER): Payer: Medicare HMO | Admitting: *Deleted

## 2019-05-19 DIAGNOSIS — I5021 Acute systolic (congestive) heart failure: Secondary | ICD-10-CM | POA: Diagnosis not present

## 2019-05-19 DIAGNOSIS — I421 Obstructive hypertrophic cardiomyopathy: Secondary | ICD-10-CM

## 2019-05-19 LAB — CUP PACEART REMOTE DEVICE CHECK
Battery Remaining Longevity: 83 mo
Battery Voltage: 2.99 V
Brady Statistic AP VP Percent: 0 %
Brady Statistic AP VS Percent: 0.44 %
Brady Statistic AS VP Percent: 0.07 %
Brady Statistic AS VS Percent: 99.49 %
Brady Statistic RA Percent Paced: 0.45 %
Brady Statistic RV Percent Paced: 0.07 %
Date Time Interrogation Session: 20201112093724
HighPow Impedance: 89 Ohm
Implantable Lead Implant Date: 20170227
Implantable Lead Implant Date: 20170227
Implantable Lead Location: 753859
Implantable Lead Location: 753860
Implantable Lead Model: 5076
Implantable Pulse Generator Implant Date: 20170227
Lead Channel Impedance Value: 399 Ohm
Lead Channel Impedance Value: 399 Ohm
Lead Channel Impedance Value: 532 Ohm
Lead Channel Pacing Threshold Amplitude: 0.5 V
Lead Channel Pacing Threshold Amplitude: 0.5 V
Lead Channel Pacing Threshold Pulse Width: 0.4 ms
Lead Channel Pacing Threshold Pulse Width: 0.4 ms
Lead Channel Sensing Intrinsic Amplitude: 23 mV
Lead Channel Sensing Intrinsic Amplitude: 23 mV
Lead Channel Sensing Intrinsic Amplitude: 3.625 mV
Lead Channel Sensing Intrinsic Amplitude: 3.625 mV
Lead Channel Setting Pacing Amplitude: 2 V
Lead Channel Setting Pacing Amplitude: 2.5 V
Lead Channel Setting Pacing Pulse Width: 0.4 ms
Lead Channel Setting Sensing Sensitivity: 0.3 mV

## 2019-05-20 ENCOUNTER — Ambulatory Visit (INDEPENDENT_AMBULATORY_CARE_PROVIDER_SITE_OTHER): Payer: Medicare HMO

## 2019-05-20 DIAGNOSIS — I5032 Chronic diastolic (congestive) heart failure: Secondary | ICD-10-CM

## 2019-05-20 DIAGNOSIS — Z95810 Presence of automatic (implantable) cardiac defibrillator: Secondary | ICD-10-CM

## 2019-05-20 NOTE — Progress Notes (Signed)
EPIC Encounter for ICM Monitoring  Patient Name: Marie Ramos is a 63 y.o. female Date: 05/20/2019 Primary Care Physican: Lawerance Cruel, MD Primary Cardiologist: Allred Electrophysiologist: Allred 04/12/2019 Weight:248lbs  Spoke with patient and she reportssheis feeling fine.  Denies fluid symptoms.  Optivol thoracic impedance normal but was suggestive of possible fluid accumulation from 10/2 - 11/11.  Prescribed:Furosemide 20 mg 1 tablet daily  Labs: 11/26/2018 Creatinine 1.20, BUN 41, Potassium 3.9, Sodium 142, GFR 48-56  Recommendations:  Encouraged to call if experiencing fluid symptoms.  Follow-up plan: ICM clinic phone appointment on 06/20/2019.   91 day device clinic remote transmission 08/18/2019.    Copy of ICM check sent to Dr. Rayann Heman.    3 month ICM trend: 05/20/2019    1 Year ICM trend:       [name], RN 05/20/2019 4:45 PM

## 2019-05-24 ENCOUNTER — Other Ambulatory Visit: Payer: Self-pay | Admitting: Physician Assistant

## 2019-05-31 ENCOUNTER — Ambulatory Visit: Payer: Medicare HMO | Admitting: Physician Assistant

## 2019-05-31 ENCOUNTER — Other Ambulatory Visit: Payer: Self-pay

## 2019-05-31 ENCOUNTER — Encounter: Payer: Self-pay | Admitting: Physician Assistant

## 2019-05-31 DIAGNOSIS — F319 Bipolar disorder, unspecified: Secondary | ICD-10-CM

## 2019-05-31 DIAGNOSIS — F411 Generalized anxiety disorder: Secondary | ICD-10-CM | POA: Diagnosis not present

## 2019-05-31 MED ORDER — OXCARBAZEPINE 300 MG PO TABS: 450.0000 mg | ORAL_TABLET | Freq: Two times a day (BID) | ORAL | 1 refills | Status: DC

## 2019-05-31 NOTE — Progress Notes (Signed)
[facility] Med Check  Patient ID: Marie Ramos,  MRN: [id number]  PCP: Lawerance Cruel, MD  Date of Evaluation: 05/31/2019 Time spent:15 minutes  Chief Complaint:  Chief Complaint    Depression; Follow-up      HISTORY/CURRENT STATUS: HPI 6 month med check.  Patient states she is doing very well.  Her medications are working just fine.  She sleeps well.  Anxiety is well controlled.  She usually only takes the benzo in the evening so that she can get thoughts out of her mind so she can relax and go to sleep.    Patient denies loss of interest in usual activities and is able to enjoy things.  Denies decreased energy or motivation.  Appetite has not changed.  No extreme sadness, tearfulness, or feelings of hopelessness.  Denies any changes in concentration, making decisions or remembering things.  Denies suicidal or homicidal thoughts.  Patient denies increased energy with decreased need for sleep, no increased talkativeness, no racing thoughts, no impulsivity or risky behaviors, no increased spending, no increased libido, no grandiosity.  Denies muscle or joint pain, stiffness, or dystonia. Denies dizziness, syncope, seizures, numbness, tingling, tremor, tics, unsteady gait, slurred speech, confusion.   Individual Medical History/ Review of Systems: Changes? :Yes  had left broken wrist in May. S/p surgery.  Doing well.    Past medications for mental health diagnoses include: unknown  Allergies: Sulfa antibiotics, Naproxen, and Vicodin [hydrocodone-acetaminophen]  Current Medications:  Current Outpatient Medications:  .  ALPRAZolam (XANAX) 0.25 MG tablet, TAKE ONE TABLET BY MOUTH DAILY AT BEDTIME AS NEEDED FOR SLEEP OR ANXIETY, Disp: 30 tablet, Rfl: 2 .  amLODipine (NORVASC) 5 MG tablet, Take 1 tablet (5 mg total) by mouth daily with breakfast., Disp: 30 tablet, Rfl: 0 .  aspirin EC 81 MG tablet, Take 81 mg by mouth daily., Disp: , Rfl:  .  atorvastatin (LIPITOR) 20 MG  tablet, Take 1 tablet (20 mg total) by mouth daily at 6 PM., Disp: 30 tablet, Rfl: 0 .  carvedilol (COREG) 12.5 MG tablet, TAKE ONE TABLET BY MOUTH TWICE DAILY WITH BREAKFAST AND DINNER, Disp: 180 tablet, Rfl: 2 .  famotidine (PEPCID) 20 MG tablet, Take 20 mg by mouth at bedtime., Disp: , Rfl:  .  fexofenadine (ALLEGRA) 180 MG tablet, Take 180 mg by mouth daily., Disp: , Rfl:  .  fluticasone (FLONASE) 50 MCG/ACT nasal spray, Place 1 spray into both nostrils daily. Use as directed, Disp: , Rfl:  .  furosemide (LASIX) 20 MG tablet, TAKE ONE TABLET BY MOUTH ONE TIME DAILY  (Patient taking differently: Take 20 mg by mouth daily. ), Disp: 90 tablet, Rfl: 2 .  losartan (COZAAR) 100 MG tablet, Take 1 tablet (100 mg total) by mouth daily., Disp: 90 tablet, Rfl: 3 .  Multiple Vitamin (MULTIVITAMIN WITH MINERALS) TABS tablet, Take 1 tablet by mouth daily., Disp: , Rfl:  .  multivitamin (PROSIGHT) TABS tablet, Take 1 tablet by mouth daily., Disp: 30 each, Rfl: 0 .  Oxcarbazepine (TRILEPTAL) 300 MG tablet, Take 1.5 tablets (450 mg total) by mouth 2 (two) times daily., Disp: 270 tablet, Rfl: 1 .  venlafaxine XR (EFFEXOR-XR) 75 MG 24 hr capsule, TAKE ONE CAPSULE BY MOUTH ONE TIME DAILY , Disp: 90 capsule, Rfl: 0 .  vitamin E 28000 units external oil, Apply topically daily., Disp: , Rfl:  .  celecoxib (CELEBREX) 100 MG capsule, Take 1 capsule (100 mg total) by mouth 2 (two) times daily. (Patient not taking: Reported  on 05/31/2019), Disp: 60 capsule, Rfl: 2 .  clobetasol (TEMOVATE) 0.05 % external solution, Apply 1 application topically 2 (two) times daily. , Disp: , Rfl:  .  traMADol (ULTRAM) 50 MG tablet, Take 50 mg by mouth daily as needed for moderate pain. , Disp: , Rfl: 0 .  triamcinolone ointment (KENALOG) 0.5 %, Apply 1 application topically 2 (two) times daily. , Disp: , Rfl: 0 Medication Side Effects: none  Family Medical/ Social History: Changes? She is now substitute teaching again for Pre-K only,  part-time.  MENTAL HEALTH EXAM:  There were no vitals taken for this visit.There is no height or weight on file to calculate BMI.  General Appearance: Casual, Neat, Well Groomed and Obese  Eye Contact:  Good  Speech:  Clear and Coherent  Volume:  Normal  Mood:  Euthymic  Affect:  Appropriate  Thought Process:  Goal Directed and Descriptions of Associations: Intact  Orientation:  Full (Time, Place, and Person)  Thought Content: Logical   Suicidal Thoughts:  No  Homicidal Thoughts:  No  Memory:  WNL  Judgement:  Good  Insight:  Good  Psychomotor Activity:  Normal  Concentration:  Concentration: Good  Recall:  Good  Fund of Knowledge: Good  Language: Good  Assets:  Desire for Improvement  ADL's:  Intact  Cognition: WNL  Prognosis:  Good    DIAGNOSES:    ICD-10-CM   1. Bipolar I disorder ([place])  F31.9   2. Generalized anxiety disorder  F41.1     Receiving Psychotherapy: No    RECOMMENDATIONS:  Continue Xanax 0.25 mg p.o. nightly as needed sleep. Continue Trileptal 300 mg, 1.5 p.o. twice daily. Continue Effexor XR 75 mg daily. Return in 6 months or sooner as needed.  Donnal Moat, PA-C

## 2019-06-10 NOTE — Progress Notes (Signed)
Remote ICD transmission.   

## 2019-06-14 DIAGNOSIS — L400 Psoriasis vulgaris: Secondary | ICD-10-CM | POA: Diagnosis not present

## 2019-06-20 ENCOUNTER — Ambulatory Visit (INDEPENDENT_AMBULATORY_CARE_PROVIDER_SITE_OTHER): Payer: Medicare HMO

## 2019-06-20 DIAGNOSIS — I5032 Chronic diastolic (congestive) heart failure: Secondary | ICD-10-CM

## 2019-06-20 DIAGNOSIS — Z95810 Presence of automatic (implantable) cardiac defibrillator: Secondary | ICD-10-CM | POA: Diagnosis not present

## 2019-06-21 NOTE — Progress Notes (Signed)
EPIC Encounter for ICM Monitoring  Patient Name: Marie Ramos is a 63 y.o. female Date: 06/21/2019 Primary Care Physican: Lawerance Cruel, MD Primary Cardiologist: Allred Electrophysiologist: Allred 06/21/2019 Weight:248lbs  Spoke with patient and she reportssheis feeling fine.  She voices no complaints.    Optivol thoracic impedance normal.  Prescribed:Furosemide 20 mg 1 tablet daily  Labs: 11/26/2018 Creatinine 1.20, BUN 41, Potassium 3.9, Sodium 142, GFR 48-56  Recommendations:Encouraged to call if experiencing fluid symptoms.  Follow-up plan: ICM clinic phone appointment on1/18/2021. 91 day device clinic remote transmission 08/18/2019.   Copy of ICM check sent to Dr.Allred.    3 month ICM trend: 06/20/2019    1 Year ICM trend:       [name], RN 06/21/2019 2:25 PM

## 2019-07-05 ENCOUNTER — Other Ambulatory Visit: Payer: Self-pay | Admitting: Physician Assistant

## 2019-07-06 NOTE — Telephone Encounter (Signed)
Next apt 12/01/2019

## 2019-07-13 DIAGNOSIS — L400 Psoriasis vulgaris: Secondary | ICD-10-CM | POA: Diagnosis not present

## 2019-07-25 ENCOUNTER — Ambulatory Visit (INDEPENDENT_AMBULATORY_CARE_PROVIDER_SITE_OTHER): Payer: Medicare HMO

## 2019-07-25 DIAGNOSIS — I5032 Chronic diastolic (congestive) heart failure: Secondary | ICD-10-CM | POA: Diagnosis not present

## 2019-07-25 DIAGNOSIS — Z95810 Presence of automatic (implantable) cardiac defibrillator: Secondary | ICD-10-CM | POA: Diagnosis not present

## 2019-07-25 NOTE — Progress Notes (Signed)
Per device clinic:  Received: Today Message Contents  Mechele Dawley, RN  Zacharee Gaddie Panda, RN  Looks like 54min of A-flutter. Let me know if you need any additional info!   Thanks,  Raquel Sarna

## 2019-07-25 NOTE — Progress Notes (Signed)
EPIC Encounter for ICM Monitoring  Patient Name: Marie Ramos is a 64 y.o. female Date: 07/25/2019 Primary Care Physican: Lawerance Cruel, MD Primary Cardiologist: Allred Electrophysiologist: Allred 07/25/2019 Weight:248-249lbs  AT/AF                           1 Episode Time in AT/AF  <0.1 hr/day (<0.1%) Longest AT/AF  13 minutes   Spoke with patient and she is feeling fine.  She has been eating restaurant foods and explained are typically high in salt.  She does not monitor fluid intake.         Transmission suggests possible AT/AF episode on 07/08/19 lasting for 12 minutes 51 seconds.  Patient currently not taking anticoagulation medication for Afib due to she declined at [facility] referral 12/06/2018.        Message sent to device clinic triage for further review.    Optivol thoracic impedance suggesting possible fluid accumulation since 07/18/2019.  Prescribed:Furosemide 20 mg 1 tablet daily  Labs: 11/26/2018 Creatinine 1.20, BUN 41, Potassium 3.9, Sodium 142, GFR 48-56  Recommendations: Reinforced limiting salt intake to < 2000 mg daily and fluid intake to 64 oz daily.  Encouraged to call if experiencing fluid symptoms.  Follow-up plan: ICM clinic phone appointment on 08/02/2019 (Manual send) to recheck fluid levels.   91 day device clinic remote transmission 08/18/2019.  Office appt 08/04/2019 with Tommye Standard, PA.    Copy of ICM check sent to Dr. Rayann Heman for review and recommendation if needed.   3 month ICM trend: 07/25/2019    1 Year ICM trend:      [name], RN 07/25/2019 11:26 AM

## 2019-08-02 NOTE — Progress Notes (Addendum)
Patient ID: Marie Ramos, female   DOB: 17-Jan-1956, 64 y.o.   MRN: [id number]    Cardiology Office Note Date:  08/04/2019  Patient ID:  Marie, Ramos [birth year], MRN [id number] PCP:  Lawerance Cruel, MD  Cardiologist:  None prior to hospital, (new) Dr. Burt Knack Electrophysiologist: Dr. Rayann Heman    Chief Complaint:  6 mo visit   History of Present Illness: Marie Ramos is a 64 y.o. female with history of cardiac arrest 08/24/15, she was at work, AED was immediately applied and recommended and received 2 shocks and CPR, FR found her in PEA with ROSC after epinephrine, very recently found to have AFib via remote device check.   08/24/15: presumd VF arrest with AED advising shocks, CPR, PEA , she had emergent cardiac cath with no CAD, long QT on her EKG noted. Hypokalemic noted as well.   She underwent hypothermia protocol.  Suspected her psych meds were the etiology pf her QT prolongation.  Echo suggested possible HCM,, MRI revealed hypertensive CM, not HCM.  MRI 2/26 noted CVA, neuro on case, possibly related to cath procedure, not recommended to have TEE or further w/u.  She underwent ICD implant  By Dr. Rayann Heman on 09/04/15.  Psychiatry was consulted and aided in her medications, to avoid QT prolonging meds. Discharged from CIR on the current regime. She developed DVT to LLE started on Eliquis.  She was discharged to CIR until 09/14/15 and has undergone out patient rehab since then.  Is now off a/c  She comes in today being seen for Dr. Rayann Heman, she was last seen by him in Jan 2019, was doing well, no changes were made to her device or therapy, planned for 6 mo f/u with APPs.    I saw her in July 2019, she was doing well.  Very active caring for her grandchild left her tired at the end of the day and sleeping well.  No CP, palpitations or SOB, no dizziness, near syncope or syncope.  No shocks.  Med list was reviewed with [facility] and confirmed no QT prolonging drugs.  No changes were  made.  12/06/2018 she was referred to the AFib clinic with a remote PPM check noting 2 days of AFib.  Marie Greenland, NP had a tele-health visit though quite unsuccessful in keeping the patient on topic and despite efforts in discussing Afib, embolic risks and [place], the patient (and her husband in the background) were unwilling to discuss.    I saw her again Aug 2020, she was doing well.  No CP, palpitations or SOB, no near syncope or syncope, no shocks.  She fell at her daughter's house (trip/fall) and broke her wrist requiring surgical repair. She had 3 AF episodes noted via her device 12/01/18 34 seconds  (day of her surgery) 5/29 had 2 episodes 38 minutes and 21 minutes Her Afib episodes occurred the day after she was discharged after her wrist surgery and she reports she was in significant pain once home We discussed embolic/stroke risk, potential risk/benefit of a/c.  She does have prior stroke, though she reports this was in the environment of her cardiac arrest, notes report suspect 2/2 LHC procedure.  She wanted to hold off on a/c.   She continues to do well.  No CP, palpitations or cardiac awareness, no SOB or DOE.  She has stairs at [place] home/plaza, she walks these for exercise weather permitting.  No dizzy spells, near syncope or syncope.   Device information: MDT  dual chamber ICD implanted 09/03/15, Dr. Rayann Heman, secondary prevention/VF arrest  Past Medical History:  Diagnosis Date  . Bipolar 1 disorder (Marie Ramos)   . Cardiac arrest (Marie Ramos) 08/24/2015  . Chronically dry eyes   . Dyspnea    r/t seasonal allergies  . Hypertrophic obstructive cardiomyopathy (Marie Ramos) 08/29/2015  . Macular degeneration   . QT prolongation 08/29/2015  . Stroke (Marie Ramos)   . TBI (traumatic brain injury) [facility]) 2011   Inpatient rehab [facility]  . Ventricular fibrillation (Marie Ramos) 09/03/15   MDT ICD Dr. Rayann Heman    Past Surgical History:  Procedure Laterality Date  . ABDOMINAL HYSTERECTOMY    . CARDIAC CATHETERIZATION N/A  08/24/2015   Procedure: Left Heart Cath and Coronary Angiography;  Surgeon: Sherren Mocha, MD;  Location: [facility];  Service: Cardiovascular;  Laterality: N/A;  . EP IMPLANTABLE DEVICE N/A 09/03/2015   MDT dual chamber ICD  . OPEN REDUCTION INTERNAL FIXATION (ORIF) DISTAL RADIAL FRACTURE Left 12/01/2018   Procedure: OPEN REDUCTION INTERNAL FIXATION (ORIF) LEFT DISTAL RADIAL FRACTURE;  Surgeon: Marie Gash, MD;  Location: [facility];  Service: Orthopedics;  Laterality: Left;  . SPLENECTOMY, TOTAL  2011  . TONSILLECTOMY      Current Outpatient Medications  Medication Sig Dispense Refill  . ALPRAZolam (XANAX) 0.25 MG tablet TAKE ONE TABLET BY MOUTH AT BEDTIME AS NEEDED FOR SLEEP OR ANXIETY 30 tablet 0  . amLODipine (NORVASC) 5 MG tablet Take 1 tablet (5 mg total) by mouth daily with breakfast. 30 tablet 0  . aspirin EC 81 MG tablet Take 81 mg by mouth daily.    Marland Kitchen atorvastatin (LIPITOR) 20 MG tablet Take 1 tablet (20 mg total) by mouth daily at 6 PM. 30 tablet 0  . carvedilol (COREG) 12.5 MG tablet TAKE ONE TABLET BY MOUTH TWICE DAILY WITH BREAKFAST AND DINNER 180 tablet 2  . celecoxib (CELEBREX) 100 MG capsule Take 1 capsule (100 mg total) by mouth 2 (two) times daily. 60 capsule 2  . clobetasol (TEMOVATE) 0.05 % external solution Apply 1 application topically 2 (two) times daily.     . famotidine (PEPCID) 20 MG tablet Take 20 mg by mouth at bedtime.    . fexofenadine (ALLEGRA) 180 MG tablet Take 180 mg by mouth daily.    . fluticasone (FLONASE) 50 MCG/ACT nasal spray Place 1 spray into both nostrils daily. Use as directed    . furosemide (LASIX) 20 MG tablet TAKE ONE TABLET BY MOUTH ONE TIME DAILY  (Patient taking differently: Take 20 mg by mouth daily. ) 90 tablet 2  . losartan (COZAAR) 100 MG tablet Take 1 tablet (100 mg total) by mouth daily. 90 tablet 3  . Multiple Vitamin (MULTIVITAMIN WITH MINERALS) TABS tablet Take 1 tablet by mouth daily.    . multivitamin (PROSIGHT) TABS tablet  Take 1 tablet by mouth daily. 30 each 0  . Oxcarbazepine (TRILEPTAL) 300 MG tablet Take 1.5 tablets (450 mg total) by mouth 2 (two) times daily. 270 tablet 1  . Risankizumab-rzaa (SKYRIZI, 150 MG DOSE, [place]) Inject into the skin every 3 (three) months.    . traMADol (ULTRAM) 50 MG tablet Take 50 mg by mouth daily as needed for moderate pain.   0  . triamcinolone ointment (KENALOG) 0.5 % Apply 1 application topically 2 (two) times daily.   0  . venlafaxine XR (EFFEXOR-XR) 75 MG 24 hr capsule TAKE ONE CAPSULE BY MOUTH ONE TIME DAILY  90 capsule 0  . vitamin E 28000 units external oil  Apply topically daily.     No current facility-administered medications for this visit.    Allergies:   Sulfa antibiotics, Naproxen, and Vicodin [hydrocodone-acetaminophen]   Social History:  The patient  reports that she has never smoked. She has never used smokeless tobacco. She reports that she does not drink alcohol or use drugs.   Family History:  The patient's family history includes Arthritis/Rheumatoid in her paternal grandmother; CAD in her mother; COPD in her father; Cancer in her mother; Depression in her father, maternal grandfather, and mother; Diabetes in her maternal grandfather.  ROS:  Please see the history of present illness.    All other systems are reviewed and otherwise negative.   PHYSICAL EXAM:  VS:  BP 138/74   Pulse 68   Ht 5\' 2"  (1.575 m)   Wt 260 lb (117.9 kg)   SpO2 99%   BMI 47.55 kg/m  BMI: Body mass index is 47.55 kg/m. Well nourished, well developed, in no acute distress  HEENT: normocephalic, atraumatic  Neck: no JVD, carotid bruits or masses Cardiac: RRR; no significant murmurs, no rubs, or gallops Lungs: CTA b/l , no wheezing, rhonchi or rales  Abd: soft, nontender, central obesity MS: no deformity or atrophy Ext:  no edema  Skin: warm and dry, psoriasis noted b/l LE Neuro:  No gross deficits appreciated Psych: euthymic mood, full affect   ICD site is stable,well  healed, no tethering or discomfort   EKG:  Done today and reviewed by myself:  SR 66bpm , manually measured QT 446ms, QTc 474ms, artifact/movement noted   ICD interrogation today and reviewed by myself.  Battery and lead measurements are good 99.5% VP One Aflutter episode 07/08/2019, lasted nearly 13 minutes   07/14/16: TTE Study Conclusions - Left ventricle: The cavity size was normal. Wall thickness was   increased in a pattern of mild LVH. Systolic function was normal.   The estimated ejection fraction was in the range of 55% to 60%.   Wall motion was normal; there were no regional wall motion   abnormalities. Doppler parameters are consistent with abnormal   left ventricular relaxation (grade 1 diastolic dysfunction). - Aortic valve: There was no stenosis. - Mitral valve: Mildly calcified annulus. There was no significant   regurgitation. - Right ventricle: The cavity size was normal. Pacer wire or   catheter noted in right ventricle. Systolic function was normal. - Pulmonary arteries: No complete TR doppler jet so unable to   estimate PA systolic pressure. - Inferior vena cava: The vessel was normal in size. The   respirophasic diameter changes were in the normal range (= 50%),   consistent with normal central venous pressure. Impressions: - Normal LV size with mild LV hypertrophy. EF 55-60%. Normal RV   size and systolic function. No significant valvular   abnormalities.  08/24/15: Echocardiogram:  Study Conclusions - Left ventricle: The cavity size was normal. Systolic function was  mildly to moderately reduced. The estimated ejection fraction was  in the range of 40% to 45%. There is severe asymmetric septal  hypetrophy without dynamic obstruction. Findings suggestive of  hypertrophic nonobstructive cardiomyopathy. Diffuse hypokinesis.  Doppler parameters are consistent with abnormal left ventricular  relaxation (grade 1 diastolic dysfunction). Septal thickness,  ED  (2D): 17 mm. Posterior wall thickness, ED (2D): 10.5 mm. - Technically difficult study. Impressions: - Technically difficult study, however appears to have asymmetric  septal hypertrophic cardiomyoapathy without obstruction. In  setting of VF arrest, would consider cardiac MRI to better  evaluate as this could be the potential etiology of her  arrhythmia.  08/24/15: LHC Conclusion    Widely patent coronary arteries Normal LVEDP       Recent Labs: 11/26/2018: BUN 41; Creatinine, Ser 1.20; Hemoglobin 11.6; Platelets 501; Potassium 3.9; Sodium 142  No results found for requested labs within last 8760 hours.   CrCl cannot be calculated (Patient's most recent lab result is older than the maximum 21 days allowed.).   Wt Readings from Last 3 Encounters:  08/04/19 260 lb (117.9 kg)  03/02/19 261 lb (118.4 kg)  11/26/18 270 lb 2 oz (122.5 kg)     Other studies reviewed: Additional studies/records reviewed today include: summarized above    ASSESSMENT AND PLAN:  1. VF arrest, QT prolongation felt secondary to psych meds 2. ICD     meds reviewed      QT is OK today       3. Bipolar     Continue f/u with psych     Avoid QT prolonging medications and caution with antipsychotics in the futr     She is aware  4. HTN 5. hypertensive CM, chronic CHF (disatolic)     Weight is stable     OptiVol was up , though back down      Sharman Cheek had already discussed with her, she had been liberal with salt and has been better about this, she denies taking any additional lasix     No symptoms or exam findings to suggest fluid OL weight is stabe    BMET today    6. AFib    CHA2DS2Vasc is one for HTN, 2 with female     she does have prior stroke, though she reports this was in the environment of her cardiac arrest, notes report suspect 2/2 LHC procedure     Rare and short episodes, burden <0.1%)  We revisited AFib and embolic/stroke risk. 2, PLUS he prior stroke (though  suspect to have been procedure related) She does not want to consider a/c, I am not convinced we need to start at this juncture.  though should we see more she would be willing to re-discuss           Disposition:  Continue Q 63month remotes. And in-clinic q 6 months office visit    Current medicines are reviewed at length with the patient today.  The patient did not have any concerns regarding medicines.  Haywood Lasso, PA-C 08/04/2019 12:20 PM     [address] [place] [id number] [phone] (office)  [phone] (fax)

## 2019-08-03 ENCOUNTER — Telehealth: Payer: Self-pay

## 2019-08-03 DIAGNOSIS — Z03818 Encounter for observation for suspected exposure to other biological agents ruled out: Secondary | ICD-10-CM | POA: Diagnosis not present

## 2019-08-03 NOTE — Telephone Encounter (Signed)
Left message for patient to remind of missed remote transmission.  

## 2019-08-04 ENCOUNTER — Other Ambulatory Visit: Payer: Self-pay

## 2019-08-04 ENCOUNTER — Ambulatory Visit: Payer: Medicare HMO | Admitting: Physician Assistant

## 2019-08-04 VITALS — BP 138/74 | HR 68 | Ht 62.0 in | Wt 260.0 lb

## 2019-08-04 DIAGNOSIS — I480 Paroxysmal atrial fibrillation: Secondary | ICD-10-CM | POA: Diagnosis not present

## 2019-08-04 DIAGNOSIS — Z79899 Other long term (current) drug therapy: Secondary | ICD-10-CM | POA: Diagnosis not present

## 2019-08-04 DIAGNOSIS — I5023 Acute on chronic systolic (congestive) heart failure: Secondary | ICD-10-CM

## 2019-08-04 DIAGNOSIS — Z95810 Presence of automatic (implantable) cardiac defibrillator: Secondary | ICD-10-CM | POA: Diagnosis not present

## 2019-08-04 DIAGNOSIS — I110 Hypertensive heart disease with heart failure: Secondary | ICD-10-CM

## 2019-08-04 DIAGNOSIS — R9431 Abnormal electrocardiogram [ECG] [EKG]: Secondary | ICD-10-CM | POA: Diagnosis not present

## 2019-08-04 LAB — BASIC METABOLIC PANEL
BUN/Creatinine Ratio: 22 (ref 12–28)
BUN: 27 mg/dL (ref 8–27)
CO2: 25 mmol/L (ref 20–29)
Calcium: 8.9 mg/dL (ref 8.7–10.3)
Chloride: 100 mmol/L (ref 96–106)
Creatinine, Ser: 1.23 mg/dL — ABNORMAL HIGH (ref 0.57–1.00)
GFR calc Af Amer: 54 mL/min/{1.73_m2} — ABNORMAL LOW (ref >59)
GFR calc non Af Amer: 47 mL/min/{1.73_m2} — ABNORMAL LOW (ref >59)
Glucose: 90 mg/dL (ref 65–99)
Potassium: 4.5 mmol/L (ref 3.5–5.2)
Sodium: 141 mmol/L (ref 134–144)

## 2019-08-04 NOTE — Patient Instructions (Signed)
Medication Instructions:   Your physician recommends that you continue on your current medications as directed. Please refer to the Current Medication list given to you today.  *If you need a refill on your cardiac medications before your next appointment, please call your pharmacy*  Lab Work:  BMET TODAY    If you have labs (blood work) drawn today and your tests are completely normal, you will receive your results only by: Marland Kitchen MyChart Message (if you have MyChart) OR . A paper copy in the mail If you have any lab test that is abnormal or we need to change your treatment, we will call you to review the results.  Testing/Procedures: NONE ORDERED  TODAY   Follow-Up: At [facility], you and your health needs are our priority.  As part of our continuing mission to provide you with exceptional heart care, we have created designated Provider Care Teams.  These Care Teams include your primary Cardiologist (physician) and Advanced Practice Providers (APPs -  Physician Assistants and Nurse Practitioners) who all work together to provide you with the care you need, when you need it.  Your next appointment:   6 month(s)  The format for your next appointment:   In Person  Provider:   You may see Thompson Grayer, MD or one of the following Advanced Practice Providers on your designated Care Team:    Chanetta Marshall, NP  Tommye Standard, PA-C  Legrand Como "Oda Kilts, [place]   Other Instructions

## 2019-08-08 NOTE — Progress Notes (Signed)
No ICM remote transmission received for 08/02/2019 and next ICM transmission scheduled for 08/29/2019.

## 2019-08-09 ENCOUNTER — Other Ambulatory Visit: Payer: Self-pay | Admitting: Physician Assistant

## 2019-08-09 NOTE — Telephone Encounter (Signed)
Next apt 12/01/2019

## 2019-08-10 NOTE — Addendum Note (Signed)
Addended by: Claude Manges on: 08/10/2019 09:32 AM   Modules accepted: Orders

## 2019-08-15 ENCOUNTER — Telehealth: Payer: Self-pay | Admitting: Physician Assistant

## 2019-08-15 DIAGNOSIS — R05 Cough: Secondary | ICD-10-CM | POA: Diagnosis not present

## 2019-08-15 DIAGNOSIS — J111 Influenza due to unidentified influenza virus with other respiratory manifestations: Secondary | ICD-10-CM | POA: Diagnosis not present

## 2019-08-15 NOTE — Telephone Encounter (Signed)
Husband called in about his wife Marie Ramos. He said that she is not functioning like herself. She seems depressed and can't complete her thoughts. Please call.

## 2019-08-15 NOTE — Telephone Encounter (Signed)
I don't think it's related to her meds or her dx, since it just started w/ current flu/covid sx.  After she gets over that, if she's still having probs, let us know.

## 2019-08-16 ENCOUNTER — Inpatient Hospital Stay (HOSPITAL_COMMUNITY): Payer: Medicare HMO

## 2019-08-16 ENCOUNTER — Emergency Department (HOSPITAL_COMMUNITY): Payer: Medicare HMO

## 2019-08-16 ENCOUNTER — Other Ambulatory Visit: Payer: Self-pay

## 2019-08-16 ENCOUNTER — Inpatient Hospital Stay (HOSPITAL_COMMUNITY)
Admission: EM | Admit: 2019-08-16 | Discharge: 2019-08-22 | DRG: 177 | Disposition: A | Discharge status: Home Health | Payer: Medicare HMO | Attending: Internal Medicine | Admitting: Internal Medicine

## 2019-08-16 ENCOUNTER — Encounter (HOSPITAL_COMMUNITY): Payer: Self-pay | Admitting: Emergency Medicine

## 2019-08-16 DIAGNOSIS — Z8674 Personal history of sudden cardiac arrest: Secondary | ICD-10-CM

## 2019-08-16 DIAGNOSIS — Z882 Allergy status to sulfonamides status: Secondary | ICD-10-CM | POA: Diagnosis not present

## 2019-08-16 DIAGNOSIS — E785 Hyperlipidemia, unspecified: Secondary | ICD-10-CM | POA: Diagnosis present

## 2019-08-16 DIAGNOSIS — Z825 Family history of asthma and other chronic lower respiratory diseases: Secondary | ICD-10-CM

## 2019-08-16 DIAGNOSIS — J1282 Pneumonia due to coronavirus disease 2019: Secondary | ICD-10-CM | POA: Diagnosis not present

## 2019-08-16 DIAGNOSIS — Z87820 Personal history of traumatic brain injury: Secondary | ICD-10-CM

## 2019-08-16 DIAGNOSIS — R0989 Other specified symptoms and signs involving the circulatory and respiratory systems: Secondary | ICD-10-CM

## 2019-08-16 DIAGNOSIS — I517 Cardiomegaly: Secondary | ICD-10-CM | POA: Diagnosis present

## 2019-08-16 DIAGNOSIS — Z7982 Long term (current) use of aspirin: Secondary | ICD-10-CM

## 2019-08-16 DIAGNOSIS — R4182 Altered mental status, unspecified: Secondary | ICD-10-CM | POA: Diagnosis not present

## 2019-08-16 DIAGNOSIS — U071 COVID-19: Principal | ICD-10-CM | POA: Diagnosis present

## 2019-08-16 DIAGNOSIS — Z8249 Family history of ischemic heart disease and other diseases of the circulatory system: Secondary | ICD-10-CM

## 2019-08-16 DIAGNOSIS — R0902 Hypoxemia: Secondary | ICD-10-CM | POA: Diagnosis not present

## 2019-08-16 DIAGNOSIS — G9341 Metabolic encephalopathy: Secondary | ICD-10-CM

## 2019-08-16 DIAGNOSIS — Z86718 Personal history of other venous thrombosis and embolism: Secondary | ICD-10-CM | POA: Diagnosis not present

## 2019-08-16 DIAGNOSIS — Z8673 Personal history of transient ischemic attack (TIA), and cerebral infarction without residual deficits: Secondary | ICD-10-CM | POA: Diagnosis not present

## 2019-08-16 DIAGNOSIS — Z833 Family history of diabetes mellitus: Secondary | ICD-10-CM

## 2019-08-16 DIAGNOSIS — Z886 Allergy status to analgesic agent status: Secondary | ICD-10-CM | POA: Diagnosis not present

## 2019-08-16 DIAGNOSIS — Z95810 Presence of automatic (implantable) cardiac defibrillator: Secondary | ICD-10-CM | POA: Diagnosis not present

## 2019-08-16 DIAGNOSIS — J9600 Acute respiratory failure, unspecified whether with hypoxia or hypercapnia: Secondary | ICD-10-CM | POA: Diagnosis present

## 2019-08-16 DIAGNOSIS — R410 Disorientation, unspecified: Secondary | ICD-10-CM | POA: Diagnosis not present

## 2019-08-16 DIAGNOSIS — J302 Other seasonal allergic rhinitis: Secondary | ICD-10-CM | POA: Diagnosis present

## 2019-08-16 DIAGNOSIS — I10 Essential (primary) hypertension: Secondary | ICD-10-CM | POA: Diagnosis not present

## 2019-08-16 DIAGNOSIS — Z20822 Contact with and (suspected) exposure to covid-19: Secondary | ICD-10-CM | POA: Diagnosis present

## 2019-08-16 DIAGNOSIS — R918 Other nonspecific abnormal finding of lung field: Secondary | ICD-10-CM | POA: Diagnosis not present

## 2019-08-16 DIAGNOSIS — I509 Heart failure, unspecified: Secondary | ICD-10-CM | POA: Diagnosis not present

## 2019-08-16 DIAGNOSIS — Z809 Family history of malignant neoplasm, unspecified: Secondary | ICD-10-CM | POA: Diagnosis not present

## 2019-08-16 DIAGNOSIS — F319 Bipolar disorder, unspecified: Secondary | ICD-10-CM | POA: Diagnosis present

## 2019-08-16 DIAGNOSIS — J9601 Acute respiratory failure with hypoxia: Secondary | ICD-10-CM | POA: Diagnosis not present

## 2019-08-16 DIAGNOSIS — Z885 Allergy status to narcotic agent status: Secondary | ICD-10-CM | POA: Diagnosis not present

## 2019-08-16 HISTORY — DX: Other specified symptoms and signs involving the circulatory and respiratory systems: R09.89

## 2019-08-16 HISTORY — DX: Metabolic encephalopathy: G93.41

## 2019-08-16 HISTORY — DX: COVID-19: U07.1

## 2019-08-16 HISTORY — DX: Pneumonia due to coronavirus disease 2019: J12.82

## 2019-08-16 LAB — CBC WITH DIFFERENTIAL/PLATELET
Abs Immature Granulocytes: 0.03 10*3/uL (ref 0.00–0.07)
Basophils Absolute: 0 10*3/uL (ref 0.0–0.1)
Basophils Relative: 0 %
Eosinophils Absolute: 0.1 10*3/uL (ref 0.0–0.5)
Eosinophils Relative: 1 %
HCT: 43.5 % (ref 36.0–46.0)
Hemoglobin: 13.4 g/dL (ref 12.0–15.0)
Immature Granulocytes: 0 %
Lymphocytes Relative: 13 %
Lymphs Abs: 1.1 10*3/uL (ref 0.7–4.0)
MCH: 29 pg (ref 26.0–34.0)
MCHC: 30.8 g/dL (ref 30.0–36.0)
MCV: 94.2 fL (ref 80.0–100.0)
Monocytes Absolute: 0.9 10*3/uL (ref 0.1–1.0)
Monocytes Relative: 10 %
Neutro Abs: 6.2 10*3/uL (ref 1.7–7.7)
Neutrophils Relative %: 76 %
Platelets: 708 10*3/uL — ABNORMAL HIGH (ref 150–400)
RBC: 4.62 MIL/uL (ref 3.87–5.11)
RDW: 14.6 % (ref 11.5–15.5)
WBC: 8.2 10*3/uL (ref 4.0–10.5)
nRBC: 2.2 % — ABNORMAL HIGH (ref 0.0–0.2)

## 2019-08-16 LAB — COMPREHENSIVE METABOLIC PANEL
ALT: 26 U/L (ref 0–44)
AST: 27 U/L (ref 15–41)
Albumin: 3.4 g/dL — ABNORMAL LOW (ref 3.5–5.0)
Alkaline Phosphatase: 125 U/L (ref 38–126)
Anion gap: 14 (ref 5–15)
BUN: 23 mg/dL (ref 8–23)
CO2: 33 mmol/L — ABNORMAL HIGH (ref 22–32)
Calcium: 9.5 mg/dL (ref 8.9–10.3)
Chloride: 97 mmol/L — ABNORMAL LOW (ref 98–111)
Creatinine, Ser: 1.19 mg/dL — ABNORMAL HIGH (ref 0.44–1.00)
GFR calc Af Amer: 56 mL/min — ABNORMAL LOW (ref >60)
GFR calc non Af Amer: 49 mL/min — ABNORMAL LOW (ref >60)
Glucose, Bld: 84 mg/dL (ref 70–99)
Potassium: 4.1 mmol/L (ref 3.5–5.1)
Sodium: 144 mmol/L (ref 135–145)
Total Bilirubin: 0.5 mg/dL (ref 0.3–1.2)
Total Protein: 7.7 g/dL (ref 6.5–8.1)

## 2019-08-16 LAB — TRIGLYCERIDES: Triglycerides: 128 mg/dL (ref <150)

## 2019-08-16 LAB — D-DIMER, QUANTITATIVE: D-Dimer, Quant: 1.97 ug/mL-FEU — ABNORMAL HIGH (ref 0.00–0.50)

## 2019-08-16 LAB — FIBRINOGEN: Fibrinogen: 550 mg/dL — ABNORMAL HIGH (ref 210–475)

## 2019-08-16 LAB — C-REACTIVE PROTEIN: CRP: 3.7 mg/dL — ABNORMAL HIGH (ref <1.0)

## 2019-08-16 LAB — RESPIRATORY PANEL BY RT PCR (FLU A&B, COVID)
Influenza A by PCR: NEGATIVE
Influenza B by PCR: NEGATIVE
SARS Coronavirus 2 by RT PCR: POSITIVE — AB

## 2019-08-16 LAB — LACTIC ACID, PLASMA
Lactic Acid, Venous: 1 mmol/L (ref 0.5–1.9)
Lactic Acid, Venous: 1.7 mmol/L (ref 0.5–1.9)

## 2019-08-16 LAB — LACTATE DEHYDROGENASE: LDH: 272 U/L — ABNORMAL HIGH (ref 98–192)

## 2019-08-16 LAB — FERRITIN: Ferritin: 19 ng/mL (ref 11–307)

## 2019-08-16 LAB — TROPONIN I (HIGH SENSITIVITY)
Troponin I (High Sensitivity): 17 ng/L (ref <18)
Troponin I (High Sensitivity): 18 ng/L — ABNORMAL HIGH (ref <18)

## 2019-08-16 LAB — BRAIN NATRIURETIC PEPTIDE: B Natriuretic Peptide: 312.9 pg/mL — ABNORMAL HIGH (ref 0.0–100.0)

## 2019-08-16 LAB — PROCALCITONIN: Procalcitonin: <0.1 ng/mL

## 2019-08-16 MED ORDER — ACETAMINOPHEN 500 MG PO TABS
1000.0000 mg | ORAL_TABLET | Freq: Once | ORAL | Status: AC
  Administered 2019-08-16: 17:00:00 1000 mg via ORAL
  Filled 2019-08-16: qty 2

## 2019-08-16 MED ORDER — NYSTATIN 100000 UNIT/GM EX POWD: Freq: Two times a day (BID) | CUTANEOUS | Status: DC

## 2019-08-16 MED ORDER — ZINC SULFATE 220 (50 ZN) MG PO CAPS
220.0000 mg | ORAL_CAPSULE | Freq: Every day | ORAL | Status: DC
  Administered 2019-08-16 – 2019-08-22 (×7): 220 mg via ORAL
  Filled 2019-08-16 (×7): qty 1

## 2019-08-16 MED ORDER — SODIUM CHLORIDE 0.9 % IV SOLN
200.0000 mg | Freq: Once | INTRAVENOUS | Status: AC
  Administered 2019-08-16: 22:00:00 200 mg via INTRAVENOUS
  Filled 2019-08-16: qty 40
  Filled 2019-08-16: qty 200

## 2019-08-16 MED ORDER — HYDRALAZINE HCL 20 MG/ML IJ SOLN
5.0000 mg | INTRAMUSCULAR | Status: DC | PRN
  Administered 2019-08-19 – 2019-08-21 (×2): 5 mg via INTRAVENOUS
  Filled 2019-08-16 (×2): qty 1

## 2019-08-16 MED ORDER — SODIUM CHLORIDE 0.9 % IV SOLN
1000.0000 mL | INTRAVENOUS | Status: DC
  Administered 2019-08-16: 17:00:00 1000 mL via INTRAVENOUS

## 2019-08-16 MED ORDER — LORATADINE 10 MG PO TABS
10.0000 mg | ORAL_TABLET | Freq: Every day | ORAL | Status: DC
  Administered 2019-08-17 – 2019-08-22 (×6): 10 mg via ORAL
  Filled 2019-08-16 (×6): qty 1

## 2019-08-16 MED ORDER — VENLAFAXINE HCL ER 75 MG PO CP24
75.0000 mg | ORAL_CAPSULE | Freq: Every day | ORAL | Status: DC
  Administered 2019-08-17 – 2019-08-22 (×6): 75 mg via ORAL
  Filled 2019-08-16 (×8): qty 1

## 2019-08-16 MED ORDER — FUROSEMIDE 10 MG/ML IJ SOLN
20.0000 mg | Freq: Once | INTRAMUSCULAR | Status: AC
  Administered 2019-08-16: 22:00:00 20 mg via INTRAVENOUS
  Filled 2019-08-16: qty 4

## 2019-08-16 MED ORDER — FAMOTIDINE 20 MG PO TABS
20.0000 mg | ORAL_TABLET | Freq: Every day | ORAL | Status: DC
  Administered 2019-08-16 – 2019-08-21 (×6): 20 mg via ORAL
  Filled 2019-08-16 (×6): qty 1

## 2019-08-16 MED ORDER — AMLODIPINE BESYLATE 5 MG PO TABS
5.0000 mg | ORAL_TABLET | Freq: Every day | ORAL | Status: DC
  Administered 2019-08-17 – 2019-08-22 (×6): 5 mg via ORAL
  Filled 2019-08-16 (×6): qty 1

## 2019-08-16 MED ORDER — FLUTICASONE PROPIONATE 50 MCG/ACT NA SUSP
1.0000 | Freq: Every day | NASAL | Status: DC
  Administered 2019-08-17 – 2019-08-22 (×6): 1 via NASAL
  Filled 2019-08-16 (×2): qty 16

## 2019-08-16 MED ORDER — GUAIFENESIN-DM 100-10 MG/5ML PO SYRP: 10.0000 mL | ORAL_SOLUTION | ORAL | Status: DC | PRN

## 2019-08-16 MED ORDER — ASPIRIN EC 81 MG PO TBEC
81.0000 mg | DELAYED_RELEASE_TABLET | Freq: Every day | ORAL | Status: DC
  Administered 2019-08-16 – 2019-08-22 (×7): 81 mg via ORAL
  Filled 2019-08-16 (×8): qty 1

## 2019-08-16 MED ORDER — CARVEDILOL 12.5 MG PO TABS
12.5000 mg | ORAL_TABLET | Freq: Two times a day (BID) | ORAL | Status: DC
  Administered 2019-08-16 – 2019-08-22 (×13): 12.5 mg via ORAL
  Filled 2019-08-16 (×15): qty 1

## 2019-08-16 MED ORDER — FUROSEMIDE 10 MG/ML IJ SOLN: 40.0000 mg | Freq: Once | INTRAMUSCULAR | Status: DC

## 2019-08-16 MED ORDER — ADULT MULTIVITAMIN W/MINERALS CH
1.0000 | ORAL_TABLET | Freq: Every day | ORAL | Status: DC
  Administered 2019-08-17 – 2019-08-22 (×6): 1 via ORAL
  Filled 2019-08-16 (×7): qty 1

## 2019-08-16 MED ORDER — ACETAMINOPHEN 325 MG PO TABS: 650.0000 mg | ORAL_TABLET | Freq: Four times a day (QID) | ORAL | Status: DC | PRN

## 2019-08-16 MED ORDER — DEXAMETHASONE SODIUM PHOSPHATE 10 MG/ML IJ SOLN
6.0000 mg | INTRAMUSCULAR | Status: DC
  Administered 2019-08-17 – 2019-08-22 (×6): 6 mg via INTRAVENOUS
  Filled 2019-08-16 (×6): qty 1

## 2019-08-16 MED ORDER — OXCARBAZEPINE 300 MG PO TABS
450.0000 mg | ORAL_TABLET | Freq: Two times a day (BID) | ORAL | Status: DC
  Administered 2019-08-16: 22:00:00 450 mg via ORAL
  Filled 2019-08-16 (×2): qty 1

## 2019-08-16 MED ORDER — ASCORBIC ACID 500 MG PO TABS
500.0000 mg | ORAL_TABLET | Freq: Every day | ORAL | Status: DC
  Administered 2019-08-16 – 2019-08-22 (×7): 500 mg via ORAL
  Filled 2019-08-16 (×7): qty 1

## 2019-08-16 MED ORDER — ENOXAPARIN SODIUM 40 MG/0.4ML ~~LOC~~ SOLN
40.0000 mg | Freq: Every day | SUBCUTANEOUS | Status: DC
  Administered 2019-08-16: 23:00:00 40 mg via SUBCUTANEOUS
  Filled 2019-08-16: qty 0.4

## 2019-08-16 MED ORDER — ATORVASTATIN CALCIUM 10 MG PO TABS
20.0000 mg | ORAL_TABLET | Freq: Every day | ORAL | Status: DC
  Administered 2019-08-17 – 2019-08-22 (×6): 20 mg via ORAL
  Filled 2019-08-16 (×6): qty 2

## 2019-08-16 MED ORDER — ALBUTEROL SULFATE HFA 108 (90 BASE) MCG/ACT IN AERS
2.0000 | INHALATION_SPRAY | Freq: Four times a day (QID) | RESPIRATORY_TRACT | Status: DC | PRN
  Filled 2019-08-16: qty 6.7

## 2019-08-16 MED ORDER — SODIUM CHLORIDE 0.9 % IV SOLN
100.0000 mg | Freq: Every day | INTRAVENOUS | Status: AC
  Administered 2019-08-17 – 2019-08-20 (×4): 100 mg via INTRAVENOUS
  Filled 2019-08-16 (×4): qty 20

## 2019-08-16 MED ORDER — DEXAMETHASONE SODIUM PHOSPHATE 10 MG/ML IJ SOLN
10.0000 mg | Freq: Once | INTRAMUSCULAR | Status: AC
  Administered 2019-08-16: 20:00:00 10 mg via INTRAVENOUS
  Filled 2019-08-16: qty 1

## 2019-08-16 NOTE — Progress Notes (Signed)
Called [facility] for transport 

## 2019-08-16 NOTE — ED Notes (Signed)
82% RA 86% 2 L [place] 93%-96% 3 L  Patient states she does not use home oxygen

## 2019-08-16 NOTE — Telephone Encounter (Signed)
I recommend we increase the Effexor XR 75 mg to 2 pills qd, not 1 qd.  Have her put on cancellation list.  If she is really confused though, call her PCP again, or if severe, go to ER.  This could be something like metabolic issues, or other infection or something unrelated to mental health or the flu.

## 2019-08-16 NOTE — ED Triage Notes (Signed)
Arrives with husband, he states the she has been altered x2-3 days, states she tested positive for the flu yesterday. Ambulatory at baseline, husband reports no appetite, no sense of smell.

## 2019-08-16 NOTE — H&P (Addendum)
History and Physical    Marie Ramos [id number] DOB: 11/12/1955 DOA: 08/16/2019  PCP: Lawerance Cruel, MD Patient coming from: Home  Chief Complaint: Altered mental status  HPI: Marie Ramos is a 64 y.o. female with medical history significant of bipolar disorder, CVA, TBI, anoxic brain injury, HOCM, QT prolongation, history of V. Fib cardiac arrest status post AICD, hypertension, hyperlipidemia, history of DVT presenting to the ED for evaluation of altered mental status.  History limited as patient appears confused.  States she has bipolar disorder but her husband is concerned that for the past 2 days she has not been acting right.  Reports having shortness of breath but is not sure when it started.  No other complaints.  Denies fevers, chills, chest pain, nausea, vomiting, abdominal pain, or diarrhea.  No additional history could be obtained from the patient.  ED Course: Temperature 100.6 F.  Not tachycardic.  Blood pressure elevated.  Oxygen saturation 82% on room air, improved with 3 L supplemental oxygen.  Labs showing no leukocytosis.  Lactic acid normal x2.  Procalcitonin <0.10. Platelet count 708, chronically elevated.  BUN 23, creatinine 1.1.  LFTs normal.  SARS-CoV-2 PCR test positive.  Influenza panel negative.  Blood culture x2 pending.  Inflammatory markers elevated: D-dimer 1.97, LDH 272, fibrinogen 550, and CRP 3.7.  BNP 312.  High-sensitivity troponin 17>18.  EKG without acute ischemic changes. Chest x-ray showing patchy opacities in the periphery of the right mid upper lung suspicious for pneumonia/atypical viral infection.  Also showing cardiomegaly with vascular congestion.  Patient received Tylenol, Decadron, and remdesivir.  Review of Systems:  All systems reviewed and apart from history of presenting illness, are negative.  Past Medical History:  Diagnosis Date  . Bipolar 1 disorder ([place])   . Cardiac arrest ([place]) 08/24/2015  . Chronically dry eyes   .  Dyspnea    r/t seasonal allergies  . Hypertrophic obstructive cardiomyopathy ([place]) 08/29/2015  . Macular degeneration   . QT prolongation 08/29/2015  . Stroke ([place])   . TBI (traumatic brain injury) [facility]) 2011   Inpatient rehab [facility]  . Ventricular fibrillation ([place]) 09/03/15   MDT ICD Dr. Rayann Heman    Past Surgical History:  Procedure Laterality Date  . ABDOMINAL HYSTERECTOMY    . CARDIAC CATHETERIZATION N/A 08/24/2015   Procedure: Left Heart Cath and Coronary Angiography;  Surgeon: Sherren Mocha, MD;  Location: [place] [facility];  Service: Cardiovascular;  Laterality: N/A;  . EP IMPLANTABLE DEVICE N/A 09/03/2015   MDT dual chamber ICD  . OPEN REDUCTION INTERNAL FIXATION (ORIF) DISTAL RADIAL FRACTURE Left 12/01/2018   Procedure: OPEN REDUCTION INTERNAL FIXATION (ORIF) LEFT DISTAL RADIAL FRACTURE;  Surgeon: Hiram Gash, MD;  Location: [facility];  Service: Orthopedics;  Laterality: Left;  . SPLENECTOMY, TOTAL  2011  . TONSILLECTOMY       reports that she has never smoked. She has never used smokeless tobacco. She reports that she does not drink alcohol or use drugs.  Allergies  Allergen Reactions  . Sulfa Antibiotics Itching    Face neck red  . Naproxen Itching  . Vicodin [Hydrocodone-Acetaminophen] Itching    Family History  Problem Relation Age of Onset  . Cancer Mother   . Depression Mother   . CAD Mother   . COPD Father   . Depression Father   . Diabetes Maternal Grandfather   . Depression Maternal Grandfather   . Arthritis/Rheumatoid Paternal Grandmother     Prior to Admission medications  Medication Sig Start Date End Date Taking? Authorizing Provider  ALPRAZolam (XANAX) 0.25 MG tablet TAKE ONE TABLET BY MOUTH AT BEDTIME AS NEEDED for sleep or anxiety Patient taking differently: Take 0.25 mg by mouth at bedtime as needed for anxiety or sleep.  08/09/19  Yes Donnal Moat T, PA-C  amLODipine (NORVASC) 5 MG tablet Take 1 tablet (5 mg total) by mouth daily with breakfast.  10/11/15  Yes Jamse Arn, MD  aspirin EC 81 MG tablet Take 81 mg by mouth daily.   Yes [provider]  atorvastatin (LIPITOR) 20 MG tablet Take 1 tablet (20 mg total) by mouth daily at 6 PM. 10/11/15  Yes Patel, Domenick Bookbinder, MD  carvedilol (COREG) 12.5 MG tablet TAKE ONE TABLET BY MOUTH TWICE DAILY WITH BREAKFAST AND DINNER Patient taking differently: Take 12.5 mg by mouth 2 (two) times daily with a meal. BREAKFAST AND DINNER 05/09/19  Yes Baldwin Jamaica, PA-C  famotidine (PEPCID) 20 MG tablet Take 20 mg by mouth at bedtime.   Yes [provider]  fexofenadine (ALLEGRA) 180 MG tablet Take 180 mg by mouth daily.   Yes [provider]  fluticasone (FLONASE) 50 MCG/ACT nasal spray Place 1 spray into both nostrils daily. Use as directed   Yes [provider]  losartan (COZAAR) 100 MG tablet Take 1 tablet (100 mg total) by mouth daily. 06/23/16  Yes Allred, Jeneen Rinks, MD  Multiple Vitamin (MULTIVITAMIN WITH MINERALS) TABS tablet Take 1 tablet by mouth daily.   Yes [provider]  multivitamin (PROSIGHT) TABS tablet Take 1 tablet by mouth daily. 09/13/15  Yes Love, Ivan Anchors, PA-C  oseltamivir (TAMIFLU) 75 MG capsule Take 75 mg by mouth 2 (two) times daily. 08/15/19  Yes [provider]  Oxcarbazepine (TRILEPTAL) 300 MG tablet Take 1.5 tablets (450 mg total) by mouth 2 (two) times daily. 05/31/19  Yes Hurst, Teresa T, PA-C  Risankizumab-rzaa (SKYRIZI, 150 MG DOSE, [place]) Inject into the skin every 3 (three) months.   Yes [provider]  traMADol (ULTRAM) 50 MG tablet Take 50 mg by mouth daily as needed for moderate pain.  01/14/18  Yes [provider]  triamcinolone ointment (KENALOG) 0.5 % Apply 1 application topically 2 (two) times daily.  07/10/17  Yes [provider]  venlafaxine XR (EFFEXOR-XR) 75 MG 24 hr capsule TAKE ONE CAPSULE BY MOUTH ONE TIME DAILY  Patient taking differently: Take 75 mg by mouth daily with breakfast.   05/24/19  Yes Hurst, Teresa T, PA-C  vitamin E 28000 units external oil Apply topically daily.   Yes [provider]  celecoxib (CELEBREX) 100 MG capsule Take 1 capsule (100 mg total) by mouth 2 (two) times daily. Patient not taking: Reported on 08/16/2019 12/01/18 12/01/19  Hiram Gash, MD  furosemide (LASIX) 20 MG tablet TAKE ONE TABLET BY MOUTH ONE TIME DAILY  Patient taking differently: Take 20 mg by mouth daily.  09/14/17   Thompson Grayer, MD    Physical Exam: Vitals:   08/16/19 1630 08/16/19 1709 08/16/19 1830 08/16/19 1900  BP: (!) 160/106 (!) 149/85 (!) 186/102 (!) 172/102  Pulse: 85 90 82 84  Resp: 18 19 (!) 27 19  Temp:      TempSrc:      SpO2: 94% 92% 93% 91%  Weight:      Height:        Physical Exam  Constitutional: She is oriented to person, place, and time. She appears well-developed and well-nourished. No distress.  Eating food  HENT:  Head: Normocephalic.  Eyes: Right eye exhibits no discharge. Left eye exhibits no discharge.  Cardiovascular: Normal rate, regular rhythm and intact distal pulses.  Pulmonary/Chest: Effort normal. She has no wheezes. She has no rales.  On 3 L supplemental oxygen  Abdominal: Soft. Bowel sounds are normal. She exhibits no distension. There is no abdominal tenderness. There is no guarding.  Musculoskeletal:        General: No edema.     Cervical back: Neck supple.  Neurological: She is alert and oriented to person, place, and time. No cranial nerve deficit.  No focal motor or sensory deficit.  Skin: Skin is warm and dry. She is not diaphoretic.     Labs on Admission: I have personally reviewed following labs and imaging studies  CBC: Recent Labs  Lab 08/16/19 1654  WBC 8.2  NEUTROABS 6.2  HGB 13.4  HCT 43.5  MCV 94.2  PLT 99991111*   Basic Metabolic Panel: Recent Labs  Lab 08/16/19 1654  NA 144  K 4.1  CL 97*  CO2 33*  GLUCOSE 84  BUN 23  CREATININE 1.19*  CALCIUM 9.5   GFR: Estimated Creatinine Clearance:  59 mL/min (A) (by C-G formula based on SCr of 1.19 mg/dL (H)). Liver Function Tests: Recent Labs  Lab 08/16/19 1654  AST 27  ALT 26  ALKPHOS 125  BILITOT 0.5  PROT 7.7  ALBUMIN 3.4*   No results for input(s): LIPASE, AMYLASE in the last 168 hours. No results for input(s): AMMONIA in the last 168 hours. Coagulation Profile: No results for input(s): INR, PROTIME in the last 168 hours. Cardiac Enzymes: No results for input(s): CKTOTAL, CKMB, CKMBINDEX, TROPONINI in the last 168 hours. BNP (last 3 results) No results for input(s): PROBNP in the last 8760 hours. HbA1C: No results for input(s): HGBA1C in the last 72 hours. CBG: No results for input(s): GLUCAP in the last 168 hours. Lipid Profile: Recent Labs    08/16/19 1654  TRIG 128   Thyroid Function Tests: No results for input(s): TSH, T4TOTAL, FREET4, T3FREE, THYROIDAB in the last 72 hours. Anemia Panel: Recent Labs    08/16/19 1654  FERRITIN 19   Urine analysis:    Component Value Date/Time   COLORURINE YELLOW 09/01/2015 1754   APPEARANCEUR CLOUDY (A) 09/01/2015 1754   LABSPEC 1.015 09/01/2015 1754   PHURINE 6.0 09/01/2015 1754   GLUCOSEU NEGATIVE 09/01/2015 1754   HGBUR SMALL (A) 09/01/2015 1754   BILIRUBINUR NEGATIVE 09/01/2015 1754   KETONESUR NEGATIVE 09/01/2015 1754   PROTEINUR NEGATIVE 09/01/2015 1754   NITRITE NEGATIVE 09/01/2015 1754   LEUKOCYTESUR TRACE (A) 09/01/2015 1754    Radiological Exams on Admission: DG Chest Port 1 View  Result Date: 08/16/2019 CLINICAL DATA:  Hypoxia. Flu positive. Decreased appetite and loss of smell. EXAM: PORTABLE CHEST 1 VIEW COMPARISON:  Radiograph 09/04/2015 FINDINGS: Dual lead left-sided pacemaker in place. Mild cardiomegaly is similar to prior. Unchanged mediastinal contours. Vascular congestion. Suggestion of patchy opacities in the periphery of the right mid upper lung zone. No large pleural effusion. No pneumothorax. Detailed evaluation is partially obscured by  soft tissue attenuation from habitus. IMPRESSION: 1. Patchy opacities in the periphery of the right mid upper lung zone, suspicious for pneumonia, including atypical viral infection. 2. Cardiomegaly with vascular congestion. Electronically Signed   By: Keith Rake M.D.   On: 08/16/2019 16:37    EKG: Independently reviewed.  Sinus rhythm, artifact in several leads.  No acute ischemic changes.  Assessment/Plan Principal Problem:   Pneumonia due to COVID-19 virus Active Problems:   Acute respiratory failure (HCC)   Essential hypertension   Pulmonary vascular congestion   Acute metabolic encephalopathy   COVID-19 viral pneumonia SARS-CoV-2 PCR test positive.  Temperature 100.6 F.   Labs showing no leukocytosis.  Lactic acid normal x2.  Procalcitonin <0.10.  Inflammatory markers elevated: D-dimer 1.97, LDH 272, fibrinogen 550, and CRP 3.7. Chest x-ray showing patchy opacities in the periphery of the right mid upper lung suspicious for pneumonia/atypical viral infection.  -Remdesivir dosing per pharmacy -IV Decadron 6 mg daily -Vitamin C, zinc -Antitussive as needed -Tylenol as needed -Albuterol inhaler as needed -Daily CBC with differential, CMP, CRP, D-dimer, LDH -Airborne and contact precautions -Blood culture x2 pending  Pulmonary vascular congestion No documented history of CHF.  Chest x-ray showing cardiomegaly with vascular congestion.  BNP slightly elevated.  Last echo done January 2018 with LVEF 55 to [id number], grade 1 diastolic dysfunction, and mild LV hypertrophy. -Cardiac monitoring -IV Lasix 20 mg once, reassess in a.m. and order additional doses -Echocardiogram -Monitor intake and output, daily weights, low-sodium diet with fluid restriction  Acute hypoxic respiratory failure Suspect related to COVID-19 viral pneumonia and pulmonary vascular congestion. Oxygen saturation 82% on room air, improved with 3 L supplemental oxygen. -Treatment of pneumonia and volume overload as  mentioned above -Continuous pulse ox, supplemental oxygen to keep oxygen saturation above [id number]  Acute metabolic encephalopathy Suspect related to acute viral illness and underlying bipolar disorder.  No focal neuro deficit appreciated on exam.  Does appear confused. -Head CT without contrast -Check B12, ammonia, TSH levels  Chronic thrombocytosis Platelet count 708, chronically elevated.  No documented history of tobacco use. -Please ensure hematology follow-up for further work-up.  Hyperlipidemia -Continue home Lipitor  Hypertension Blood pressure elevated. -Continue home amlodipine, Coreg  Addendum: Lasix as above.  Hydralazine as needed for SBP >160.   Bipolar disorder -Continue home Trileptal, Effexor  DVT prophylaxis: Lovenox Code Status: Full code Family Communication: No family available at this time. Disposition Plan: Anticipate discharge after clinical improvement. Consults called: None Admission status: It is my clinical opinion that admission to INPATIENT is reasonable and necessary in this 64 y.o. female . presenting with acute hypoxic respiratory failure secondary to COVID-19 viral pneumonia and volume overload.  Has a new supplemental oxygen requirement.  High risk of decompensation.  Given the aforementioned, the predictability of an adverse outcome is felt to be significant. I expect that the patient will require at least 2 midnights in the hospital to treat this condition.   The medical decision making on this patient was of high complexity and the patient is at high risk for clinical deterioration, therefore this is a level 3 visit.  Shela Leff MD [facility]  If 7PM-7AM, please contact night-coverage [email or web address] Password [facility]  08/16/2019, 8:48 PM

## 2019-08-16 NOTE — ED Provider Notes (Addendum)
Marie Ramos DEPT Provider Note   CSN: [id number] Arrival date & time: 08/16/19  1451     History Chief Complaint  Patient presents with  . Altered Mental Status    Marie Ramos is a 64 y.o. female.  HPI Patient presents with altered mental status.  Her husband reports this is been going on for several days now.  He has seen similar type of episodes with bipolar episodes however, she has not had that for a number of years now.  She has just complained of "not feeling well".  He was not aware that she is actually running a fever right now.  She has not had any very specific symptoms to identify except confusion and decreased activity level.  She was tested yesterday for Covid and influenza and told that she tested positive for influenza negative for Covid.  Patient denies any focal pain.  She does recognize that she is pretty confused and has very poor recall right now.  She is alert and interactive.  She cannot recall what the month is or the year.  She thinks that is because I am distracting her in getting her confused with a lot of questions.  She is very pleasant about this however.  Her main concern is that she really needs to go to the bathroom.    Past Medical History:  Diagnosis Date  . Bipolar 1 disorder ([place])   . Cardiac arrest ([place]) 08/24/2015  . Chronically dry eyes   . Dyspnea    r/t seasonal allergies  . Hypertrophic obstructive cardiomyopathy ([place]) 08/29/2015  . Macular degeneration   . QT prolongation 08/29/2015  . Stroke ([place])   . TBI (traumatic brain injury) [facility]) 2011   Inpatient rehab [facility]  . Ventricular fibrillation ([place]) 09/03/15   MDT ICD Dr. Rayann Heman    Patient Active Problem List   Diagnosis Date Noted  . Closed fracture of left distal radius 12/01/2018  . Cerebrovascular accident (CVA) due to embolism of cerebral artery ([place]) 12/05/2015  . HLD (hyperlipidemia) 12/05/2015  . AICD (automatic cardioverter/defibrillator)  present 12/05/2015  . DVT (deep venous thrombosis) ([place]) 12/05/2015  . E. coli UTI 09/05/2015  . Anoxic brain injury ([place]) 09/05/2015  . Abnormality of gait   . Acute bilat watershed infarction [facility])   . Notalgia   . Acute systolic congestive heart failure ([place])   . Benign essential HTN   . Acute lower UTI   . AKI (acute kidney injury) ([place])   . History of traumatic brain injury   . Bipolar affective disorder in remission ([place])   . Leukocytosis   . Thrombocytosis ([place])   . Cerebral infarction due to embolism of cerebral artery ([place])   . Anoxic brain damage ([facility])   . Cough   . Essential hypertension   . Systolic dysfunction with acute on chronic heart failure ([place])   . Hypertrophic obstructive cardiomyopathy ([place]) 08/29/2015  . QT prolongation 08/29/2015  . Hypokalemia 08/29/2015  . Acute respiratory failure ([place])   . Cardiac arrest ([place]) 08/24/2015    Past Surgical History:  Procedure Laterality Date  . ABDOMINAL HYSTERECTOMY    . CARDIAC CATHETERIZATION N/A 08/24/2015   Procedure: Left Heart Cath and Coronary Angiography;  Surgeon: Sherren Mocha, MD;  Location: [place] [facility];  Service: Cardiovascular;  Laterality: N/A;  . EP IMPLANTABLE DEVICE N/A 09/03/2015   MDT dual chamber ICD  . OPEN REDUCTION INTERNAL FIXATION (ORIF) DISTAL RADIAL FRACTURE Left 12/01/2018   Procedure: OPEN  REDUCTION INTERNAL FIXATION (ORIF) LEFT DISTAL RADIAL FRACTURE;  Surgeon: Hiram Gash, MD;  Location: [facility];  Service: Orthopedics;  Laterality: Left;  . SPLENECTOMY, TOTAL  2011  . TONSILLECTOMY       OB History   No obstetric history on file.     Family History  Problem Relation Age of Onset  . Cancer Mother   . Depression Mother   . CAD Mother   . COPD Father   . Depression Father   . Diabetes Maternal Grandfather   . Depression Maternal Grandfather   . Arthritis/Rheumatoid Paternal Grandmother     Social History   Tobacco Use  . Smoking status: Never Smoker  . Smokeless  tobacco: Never Used  Substance Use Topics  . Alcohol use: No  . Drug use: No    Home Medications Prior to Admission medications   Medication Sig Start Date End Date Taking? Authorizing Provider  ALPRAZolam (XANAX) 0.25 MG tablet TAKE ONE TABLET BY MOUTH AT BEDTIME AS NEEDED for sleep or anxiety Patient taking differently: Take 0.25 mg by mouth at bedtime as needed for anxiety or sleep.  08/09/19  Yes Donnal Moat T, PA-C  amLODipine (NORVASC) 5 MG tablet Take 1 tablet (5 mg total) by mouth daily with breakfast. 10/11/15  Yes Jamse Arn, MD  aspirin EC 81 MG tablet Take 81 mg by mouth daily.   Yes [provider]  atorvastatin (LIPITOR) 20 MG tablet Take 1 tablet (20 mg total) by mouth daily at 6 PM. 10/11/15  Yes Patel, Domenick Bookbinder, MD  carvedilol (COREG) 12.5 MG tablet TAKE ONE TABLET BY MOUTH TWICE DAILY WITH BREAKFAST AND DINNER Patient taking differently: Take 12.5 mg by mouth 2 (two) times daily with a meal. BREAKFAST AND DINNER 05/09/19  Yes Baldwin Jamaica, PA-C  famotidine (PEPCID) 20 MG tablet Take 20 mg by mouth at bedtime.   Yes [provider]  fexofenadine (ALLEGRA) 180 MG tablet Take 180 mg by mouth daily.   Yes [provider]  fluticasone (FLONASE) 50 MCG/ACT nasal spray Place 1 spray into both nostrils daily. Use as directed   Yes [provider]  losartan (COZAAR) 100 MG tablet Take 1 tablet (100 mg total) by mouth daily. 06/23/16  Yes Allred, Jeneen Rinks, MD  Multiple Vitamin (MULTIVITAMIN WITH MINERALS) TABS tablet Take 1 tablet by mouth daily.   Yes [provider]  multivitamin (PROSIGHT) TABS tablet Take 1 tablet by mouth daily. 09/13/15  Yes Love, Ivan Anchors, PA-C  oseltamivir (TAMIFLU) 75 MG capsule Take 75 mg by mouth 2 (two) times daily. 08/15/19  Yes [provider]  Oxcarbazepine (TRILEPTAL) 300 MG tablet Take 1.5 tablets (450 mg total) by mouth 2 (two) times daily. 05/31/19  Yes Hurst, Teresa T, PA-C  Risankizumab-rzaa  (SKYRIZI, 150 MG DOSE, [place]) Inject into the skin every 3 (three) months.   Yes [provider]  traMADol (ULTRAM) 50 MG tablet Take 50 mg by mouth daily as needed for moderate pain.  01/14/18  Yes [provider]  triamcinolone ointment (KENALOG) 0.5 % Apply 1 application topically 2 (two) times daily.  07/10/17  Yes [provider]  venlafaxine XR (EFFEXOR-XR) 75 MG 24 hr capsule TAKE ONE CAPSULE BY MOUTH ONE TIME DAILY  Patient taking differently: Take 75 mg by mouth daily with breakfast.  05/24/19  Yes Hurst, Teresa T, PA-C  vitamin E 28000 units external oil Apply topically daily.   Yes [provider]  celecoxib (CELEBREX) 100  MG capsule Take 1 capsule (100 mg total) by mouth 2 (two) times daily. Patient not taking: Reported on 08/16/2019 12/01/18 12/01/19  Hiram Gash, MD  furosemide (LASIX) 20 MG tablet TAKE ONE TABLET BY MOUTH ONE TIME DAILY  Patient taking differently: Take 20 mg by mouth daily.  09/14/17   Allred, Jeneen Rinks, MD    Allergies    Sulfa antibiotics, Naproxen, and Vicodin [hydrocodone-acetaminophen]  Review of Systems   Review of Systems 10 Systems reviewed and are negative for acute change except as noted in the HPI.  Physical Exam Updated Vital Signs BP (!) 172/102   Pulse 84   Temp (!) 100.6 F (38.1 C) (Oral)   Resp 19   Ht 5\' 2"  (1.575 m)   Wt 117.9 kg   SpO2 91%   BMI 47.55 kg/m   Physical Exam Constitutional:      Comments: Patient is alert and pleasant.  She does not appear lethargic or somnolent.  She does not exhibit dyspnea at rest.  Moderate obesity.  HENT:     Head: Normocephalic.     Mouth/Throat:     Mouth: Mucous membranes are moist.     Pharynx: Oropharynx is clear.  Eyes:     Extraocular Movements: Extraocular movements intact.     Conjunctiva/sclera: Conjunctivae normal.     Pupils: Pupils are equal, round, and reactive to light.  Cardiovascular:     Rate and Rhythm: Normal rate and regular rhythm.    Pulmonary:     Effort: Pulmonary effort is normal.     Breath sounds: Normal breath sounds.  Abdominal:     Comments: Abdomen is obese.  Is soft and nontender.  Patient does have some candidal rash with moist drainage under the left inguinal crease.  This is not severe.  There does not appear to be associated cellulitis.  Patient denies any tenderness in the upper thigh or the lower abdomen in this area.  Does not appear to be any associated panniculitis.  Musculoskeletal:        General: No swelling or tenderness. Normal range of motion.     Cervical back: Neck supple.     Right lower leg: No edema.     Left lower leg: No edema.     Comments: Feet and lower legs are in good condition.  No swelling, no cellulitis, no wounds.  Feet are warm and dry.  Skin:    General: Skin is warm and dry.  Neurological:     Comments: Patient is alert.  No cranial nerve deficits.  Speech is clear.  Content is situationally appropriate but she just does not seem to have any recall to answer questions.  Patient can follow commands to do finger-nose exam bilaterally.  No focal motor deficits.  Psychiatric:        Mood and Affect: Mood normal.     Comments: Mood seems slightly elevated.     ED Results / Procedures / Treatments   Labs (all labs ordered are listed, but only abnormal results are displayed) Labs Reviewed  RESPIRATORY PANEL BY RT PCR (FLU A&B, COVID) - Abnormal; Notable for the following components:      Result Value   SARS Coronavirus 2 by RT PCR POSITIVE (*)    All other components within normal limits  CBC WITH DIFFERENTIAL/PLATELET - Abnormal; Notable for the following components:   Platelets 708 (*)    nRBC 2.2 (*)    All other components within normal limits  COMPREHENSIVE  METABOLIC PANEL - Abnormal; Notable for the following components:   Chloride 97 (*)    CO2 33 (*)    Creatinine, Ser 1.19 (*)    Albumin 3.4 (*)    GFR calc non Af Amer 49 (*)    GFR calc Af Amer 56 (*)    All  other components within normal limits  D-DIMER, QUANTITATIVE (NOT AT [facility]) - Abnormal; Notable for the following components:   D-Dimer, Quant 1.97 (*)    All other components within normal limits  LACTATE DEHYDROGENASE - Abnormal; Notable for the following components:   LDH 272 (*)    All other components within normal limits  FIBRINOGEN - Abnormal; Notable for the following components:   Fibrinogen 550 (*)    All other components within normal limits  C-REACTIVE PROTEIN - Abnormal; Notable for the following components:   CRP 3.7 (*)    All other components within normal limits  BRAIN NATRIURETIC PEPTIDE - Abnormal; Notable for the following components:   B Natriuretic Peptide 312.9 (*)    All other components within normal limits  TROPONIN I (HIGH SENSITIVITY) - Abnormal; Notable for the following components:   Troponin I (High Sensitivity) 18 (*)    All other components within normal limits  CULTURE, BLOOD (ROUTINE X 2)  CULTURE, BLOOD (ROUTINE X 2)  LACTIC ACID, PLASMA  LACTIC ACID, PLASMA  PROCALCITONIN  FERRITIN  TRIGLYCERIDES  TROPONIN I (HIGH SENSITIVITY)    EKG EKG Interpretation  Date/Time:  Tuesday August 16 2019 16:51:34 EST Ventricular Rate:  81 PR Interval:    QRS Duration: 102 QT Interval:  424 QTC Calculation: 493 R Axis:   110 Text Interpretation: Sinus rhythm a lot of artifact, no acute ischemic appearance. Confirmed by Charlesetta Shanks [phone]) on 08/16/2019 7:34:41 PM   Radiology DG Chest Port 1 View  Result Date: 08/16/2019 CLINICAL DATA:  Hypoxia. Flu positive. Decreased appetite and loss of smell. EXAM: PORTABLE CHEST 1 VIEW COMPARISON:  Radiograph 09/04/2015 FINDINGS: Dual lead left-sided pacemaker in place. Mild cardiomegaly is similar to prior. Unchanged mediastinal contours. Vascular congestion. Suggestion of patchy opacities in the periphery of the right mid upper lung zone. No large pleural effusion. No pneumothorax. Detailed evaluation is  partially obscured by soft tissue attenuation from habitus. IMPRESSION: 1. Patchy opacities in the periphery of the right mid upper lung zone, suspicious for pneumonia, including atypical viral infection. 2. Cardiomegaly with vascular congestion. Electronically Signed   By: Keith Rake M.D.   On: 08/16/2019 16:37    Procedures Procedures (including critical care time) CRITICAL CARE Performed by: Charlesetta Shanks   Total critical care time: 45 minutes  Critical care time was exclusive of separately billable procedures and treating other patients.  Critical care was necessary to treat or prevent imminent or life-threatening deterioration.  Critical care was time spent personally by me on the following activities: development of treatment plan with patient and/or surrogate as well as nursing, discussions with consultants, evaluation of patient's response to treatment, examination of patient, obtaining history from patient or surrogate, ordering and performing treatments and interventions, ordering and review of laboratory studies, ordering and review of radiographic studies, pulse oximetry and re-evaluation of patient's condition. Medications Ordered in ED Medications  0.9 %  sodium chloride infusion (1,000 mLs Intravenous New Bag/Given 08/16/19 1712)  dexamethasone (DECADRON) injection 10 mg (has no administration in time range)  acetaminophen (TYLENOL) tablet 1,000 mg (1,000 mg Oral Given 08/16/19 1709)    ED Course  I have  reviewed the triage vital signs and the nursing notes.  Pertinent labs & imaging results that were available during my care of the patient were reviewed by me and considered in my medical decision making (see chart for details).    MDM Rules/Calculators/A&P                     Consult: Dr. Sid Falcon or tried hospitalist for admission patient presents as outlined above.  She does have a positive Covid testing as well as patchy bilateral pneumonia.  Patient is hypoxic to  about 83% on room air.  She does exhibit confusion.  She is alert and not somnolent.  Confusion may be the combined effect of Covid plus underlying psychiatric illness.  She has been very pleasant but confused.  She has been redirectable.  We will start remdesivir and Decadron with planned admission.  Patient's husband updated. Final Clinical Impression(s) / ED Diagnoses Final diagnoses:  Pneumonia due to COVID-19 virus  Confusion  Hypoxia    Rx / DC Orders ED Discharge Orders    None       Charlesetta Shanks, MD 08/16/19 Holli Humbles    Charlesetta Shanks, MD 08/16/19 2012

## 2019-08-16 NOTE — Progress Notes (Signed)
ED TO INPATIENT HANDOFF REPORT  ED Nurse Name and Phone #: Unique Sillas [phone]  S Name/Age/Gender Marie Ramos 64 y.o. female Room/Bed: WA23/WA23  Code Status   Code Status: Full Code  Home/SNF/Other Home Patient oriented to: self Is this baseline? No   Triage Complete: Triage complete  Chief Complaint Pneumonia due to COVID-19 virus [U07.1, J12.82]  Triage Note Arrives with husband, he states the she has been altered x2-3 days, states she tested positive for the flu yesterday. Ambulatory at baseline, husband reports no appetite, no sense of smell.     Allergies Allergies  Allergen Reactions  . Sulfa Antibiotics Itching    Face neck red  . Naproxen Itching  . Vicodin [Hydrocodone-Acetaminophen] Itching    Level of Care/Admitting Diagnosis ED Disposition    ED Disposition Condition [facility] Area: [place] [100101]  Level of Care: Telemetry [5]  Covid Evaluation: Confirmed COVID Positive  Diagnosis: Pneumonia due to COVID-19 virus [id number]  Admitting Physician: Shela Leff [id number]  Attending Physician: Shela Leff [id number]  Estimated length of stay: past midnight tomorrow  Certification:: I certify this patient will need inpatient services for at least 2 midnights       B Medical/Surgery History Past Medical History:  Diagnosis Date  . Bipolar 1 disorder ([place])   . Cardiac arrest ([place]) 08/24/2015  . Chronically dry eyes   . Dyspnea    r/t seasonal allergies  . Hypertrophic obstructive cardiomyopathy ([place]) 08/29/2015  . Macular degeneration   . QT prolongation 08/29/2015  . Stroke ([place])   . TBI (traumatic brain injury) [facility]) 2011   Inpatient rehab [facility]  . Ventricular fibrillation ([place]) 09/03/15   MDT ICD Dr. Rayann Heman   Past Surgical History:  Procedure Laterality Date  . ABDOMINAL HYSTERECTOMY    . CARDIAC CATHETERIZATION N/A 08/24/2015   Procedure: Left Heart Cath and Coronary Angiography;   Surgeon: Sherren Mocha, MD;  Location: [place] [facility];  Service: Cardiovascular;  Laterality: N/A;  . EP IMPLANTABLE DEVICE N/A 09/03/2015   MDT dual chamber ICD  . OPEN REDUCTION INTERNAL FIXATION (ORIF) DISTAL RADIAL FRACTURE Left 12/01/2018   Procedure: OPEN REDUCTION INTERNAL FIXATION (ORIF) LEFT DISTAL RADIAL FRACTURE;  Surgeon: Hiram Gash, MD;  Location: [facility];  Service: Orthopedics;  Laterality: Left;  . SPLENECTOMY, TOTAL  2011  . TONSILLECTOMY       A IV Location/Drains/Wounds Patient Lines/Drains/Airways Status   Active Line/Drains/Airways    Name:   Placement date:   Placement time:   Site:   Days:   Peripheral IV 08/16/19 Right Antecubital   08/16/19    -    Antecubital   less than 1   Peripheral IV 08/16/19 Left Wrist   08/16/19    -    Wrist   less than 1   Incision (Closed) 12/01/18 Arm Left   12/01/18    1424     258          Intake/Output Last 24 hours No intake or output data in the 24 hours ending 08/16/19 2252  Labs/Imaging Results for orders placed or performed during the hospital encounter of 08/16/19 (from the past 48 hour(s))  Respiratory Panel by RT PCR (Flu A&B, Covid) - Nasopharyngeal Swab     Status: Abnormal   Collection Time: 08/16/19  4:21 PM   Specimen: Nasopharyngeal Swab  Result Value Ref Range   SARS Coronavirus 2 by RT PCR POSITIVE (A) NEGATIVE  Comment: RESULT CALLED TO, READ BACK BY AND VERIFIED WITH: OXENDINE,J. RN @1917  ON 02.09.2021 BY COHEN,K (NOTE) SARS-CoV-2 target nucleic acids are DETECTED. SARS-CoV-2 RNA is generally detectable in upper respiratory specimens  during the acute phase of infection. Positive results are indicative of the presence of the identified virus, but do not rule out bacterial infection or co-infection with other pathogens not detected by the test. Clinical correlation with patient history and other diagnostic information is necessary to determine patient infection status. The expected result is  Negative. Fact Sheet for Patients:  PinkCheek.be Fact Sheet for Healthcare Providers: GravelBags.it This test is not yet approved or cleared by the [place] FDA and  has been authorized for detection and/or diagnosis of SARS-CoV-2 by FDA under an Emergency Use Authorization (EUA).  This EUA will remain in effect (meaning this test can  be used) for the duration of  the COVID-19 declaration under Section 564(b)(1) of the Act, 21 U.S.C. section 360bbb-3(b)(1), unless the authorization is terminated or revoked sooner.    Influenza A by PCR NEGATIVE NEGATIVE   Influenza B by PCR NEGATIVE NEGATIVE    Comment: (NOTE) The Xpert Xpress SARS-CoV-2/FLU/RSV assay is intended as an aid in  the diagnosis of influenza from Nasopharyngeal swab specimens and  should not be used as a sole basis for treatment. Nasal washings and  aspirates are unacceptable for Xpert Xpress SARS-CoV-2/FLU/RSV  testing. Fact Sheet for Patients: PinkCheek.be Fact Sheet for Healthcare Providers: GravelBags.it This test is not yet approved or cleared by the [place] FDA and  has been authorized for detection and/or diagnosis of SARS-CoV-2 by  FDA under an Emergency Use Authorization (EUA). This EUA will remain  in effect (meaning this test can be used) for the duration of the  Covid-19 declaration under Section 564(b)(1) of the Act, 21  U.S.C. section 360bbb-3(b)(1), unless the authorization is  terminated or revoked. Performed at [facility], [place] [address] [place], [place] [address]   Lactic acid, plasma     Status: None   Collection Time: 08/16/19  4:54 PM  Result Value Ref Range   Lactic Acid, Venous 1.0 0.5 - 1.9 mmol/L    Comment: Performed at [facility], [place] [address] [place], [place] [address]  CBC WITH DIFFERENTIAL     Status: Abnormal    Collection Time: 08/16/19  4:54 PM  Result Value Ref Range   WBC 8.2 4.0 - 10.5 K/uL   RBC 4.62 3.87 - 5.11 MIL/uL   Hemoglobin 13.4 12.0 - 15.0 g/dL   HCT 43.5 36.0 - 46.0 %   MCV 94.2 80.0 - 100.0 fL   MCH 29.0 26.0 - 34.0 pg   MCHC 30.8 30.0 - 36.0 g/dL   RDW 14.6 11.5 - 15.5 %   Platelets 708 (H) 150 - 400 K/uL   nRBC 2.2 (H) 0.0 - 0.2 %   Neutrophils Relative % 76 %   Neutro Abs 6.2 1.7 - 7.7 K/uL   Lymphocytes Relative 13 %   Lymphs Abs 1.1 0.7 - 4.0 K/uL   Monocytes Relative 10 %   Monocytes Absolute 0.9 0.1 - 1.0 K/uL   Eosinophils Relative 1 %   Eosinophils Absolute 0.1 0.0 - 0.5 K/uL   Basophils Relative 0 %   Basophils Absolute 0.0 0.0 - 0.1 K/uL   Immature Granulocytes 0 %   Abs Immature Granulocytes 0.03 0.00 - 0.07 K/uL    Comment: Performed at [facility], [place]  [address] [place], [place] [address]  Comprehensive metabolic panel     Status: Abnormal   Collection Time: 08/16/19  4:54 PM  Result Value Ref Range   Sodium 144 135 - 145 mmol/L   Potassium 4.1 3.5 - 5.1 mmol/L   Chloride 97 (L) 98 - 111 mmol/L   CO2 33 (H) 22 - 32 mmol/L   Glucose, Bld 84 70 - 99 mg/dL   BUN 23 8 - 23 mg/dL   Creatinine, Ser 1.19 (H) 0.44 - 1.00 mg/dL   Calcium 9.5 8.9 - 10.3 mg/dL   Total Protein 7.7 6.5 - 8.1 g/dL   Albumin 3.4 (L) 3.5 - 5.0 g/dL   AST 27 15 - 41 U/L   ALT 26 0 - 44 U/L   Alkaline Phosphatase 125 38 - 126 U/L   Total Bilirubin 0.5 0.3 - 1.2 mg/dL   GFR calc non Af Amer 49 (L) >60 mL/min   GFR calc Af Amer 56 (L) >60 mL/min   Anion gap 14 5 - 15    Comment: Performed at [facility], [place] [address] [place], [place] [address]  D-dimer, quantitative     Status: Abnormal   Collection Time: 08/16/19  4:54 PM  Result Value Ref Range   D-Dimer, Quant 1.97 (H) 0.00 - 0.50 ug/mL-FEU    Comment: Performed at [facility], [place] [address] [place], [place] [address]  Procalcitonin     Status: None    Collection Time: 08/16/19  4:54 PM  Result Value Ref Range   Procalcitonin <0.10 ng/mL    Comment:        Interpretation: PCT (Procalcitonin) <= 0.5 ng/mL: Systemic infection (sepsis) is not likely. Local bacterial infection is possible. (NOTE)       Sepsis PCT Algorithm           Lower Respiratory Tract                                      Infection PCT Algorithm    ----------------------------     ----------------------------         PCT < 0.25 ng/mL                PCT < 0.10 ng/mL         Strongly encourage             Strongly discourage   discontinuation of antibiotics    initiation of antibiotics    ----------------------------     -----------------------------       PCT 0.25 - 0.50 ng/mL            PCT 0.10 - 0.25 ng/mL               OR       >80% decrease in PCT            Discourage initiation of                                            antibiotics      Encourage discontinuation           of antibiotics    ----------------------------     -----------------------------         PCT >= 0.50 ng/mL  PCT 0.26 - 0.50 ng/mL               AND        <80% decrease in PCT             Encourage initiation of                                             antibiotics       Encourage continuation           of antibiotics    ----------------------------     -----------------------------        PCT >= 0.50 ng/mL                  PCT > 0.50 ng/mL               AND         increase in PCT                  Strongly encourage                                      initiation of antibiotics    Strongly encourage escalation           of antibiotics                                     -----------------------------                                           PCT <= 0.25 ng/mL                                                 OR                                        > 80% decrease in PCT                                     Discontinue / Do not initiate                                              antibiotics Performed at [place] [address], [place], [place] [address]   Lactate dehydrogenase     Status: Abnormal   Collection Time: 08/16/19  4:54 PM  Result Value Ref Range   LDH 272 (H) 98 - 192 U/L    Comment: Performed at [facility], [place] [address], [place], [place] [address]  Ferritin     Status: None   Collection Time: 08/16/19  4:54 PM  Result Value Ref  Range   Ferritin 19 11 - 307 ng/mL    Comment: Performed at [facility], [place] [address], [place], [place] [address]  Triglycerides     Status: None   Collection Time: 08/16/19  4:54 PM  Result Value Ref Range   Triglycerides 128 <150 mg/dL    Comment: Performed at [facility], [place] [address] [place], [place] [address]  Fibrinogen     Status: Abnormal   Collection Time: 08/16/19  4:54 PM  Result Value Ref Range   Fibrinogen 550 (H) 210 - 475 mg/dL    Comment: Performed at [facility], [place] [address], [place], [place] [address]  C-reactive protein     Status: Abnormal   Collection Time: 08/16/19  4:54 PM  Result Value Ref Range   CRP 3.7 (H) <1.0 mg/dL    Comment: Performed at [facility], [place] [address], [place], [place] [address]  Brain natriuretic peptide     Status: Abnormal   Collection Time: 08/16/19  4:54 PM  Result Value Ref Range   B Natriuretic Peptide 312.9 (H) 0.0 - 100.0 pg/mL    Comment: Performed at [facility], [place] [address], [place], [place] [address]  Troponin I (High Sensitivity)     Status: None   Collection Time: 08/16/19  4:54 PM  Result Value Ref Range   Troponin I (High Sensitivity) 17 <18 ng/L    Comment: (NOTE) Elevated high sensitivity troponin I (hsTnI) values and significant  changes across serial measurements may suggest ACS but many other  chronic and acute conditions are known to elevate hsTnI results.  Refer to the "Links"  section for chest pain algorithms and additional  guidance. Performed at [facility], [place] [address] [place], [place] [address]   Lactic acid, plasma     Status: None   Collection Time: 08/16/19  6:20 PM  Result Value Ref Range   Lactic Acid, Venous 1.7 0.5 - 1.9 mmol/L    Comment: Performed at [facility], [place] [address] [place], [place] [address]  Troponin I (High Sensitivity)     Status: Abnormal   Collection Time: 08/16/19  6:20 PM  Result Value Ref Range   Troponin I (High Sensitivity) 18 (H) <18 ng/L    Comment: (NOTE) Elevated high sensitivity troponin I (hsTnI) values and significant  changes across serial measurements may suggest ACS but many other  chronic and acute conditions are known to elevate hsTnI results.  Refer to the "Links" section for chest pain algorithms and additional  guidance. Performed at [facility], [place] [address], [place], [place] [address]    CT HEAD WO CONTRAST  Result Date: 08/16/2019 CLINICAL DATA:  Altered mental status EXAM: CT HEAD WITHOUT CONTRAST TECHNIQUE: Contiguous axial images were obtained from the base of the skull through the vertex without intravenous contrast. COMPARISON:  08/24/2015 FINDINGS: Brain: No acute intracranial abnormality. Specifically, no hemorrhage, hydrocephalus, mass lesion, acute infarction, or significant intracranial injury. Vascular: No hyperdense vessel or unexpected calcification. Skull: No acute calvarial abnormality. Sinuses/Orbits: Visualized paranasal sinuses and mastoids clear. Orbital soft tissues unremarkable. Other: None IMPRESSION: No intracranial abnormality. Electronically Signed   By: Rolm Baptise M.D.   On: 08/16/2019 21:38   DG Chest Port 1 View  Result Date: 08/16/2019 CLINICAL DATA:  Hypoxia. Flu positive. Decreased appetite and loss of smell. EXAM: PORTABLE CHEST 1 VIEW COMPARISON:  Radiograph 09/04/2015 FINDINGS: Dual lead left-sided  pacemaker in place. Mild cardiomegaly is similar to prior. Unchanged mediastinal contours. Vascular congestion. Suggestion of patchy opacities in the periphery of the right mid upper lung zone. No large pleural effusion. No pneumothorax. Detailed evaluation is partially obscured by soft tissue attenuation from habitus. IMPRESSION: 1. Patchy opacities in the periphery of the right mid upper lung zone, suspicious for pneumonia, including atypical viral infection. 2. Cardiomegaly with vascular congestion. Electronically Signed   By: Keith Rake M.D.   On: 08/16/2019 16:37    Pending Labs Unresulted Labs (From admission, onward)    Start     Ordered   08/17/19 0500  CBC with Differential/Platelet  Daily,   R     08/16/19 2011   08/17/19 0500  Comprehensive metabolic panel  Daily,   R     08/16/19 2011   08/17/19 0500  C-reactive protein  Daily,   R     08/16/19 2011   08/17/19 0500  D-dimer, quantitative (not at [facility])  Daily,   R     08/16/19 2011   08/17/19 0500  Lactate dehydrogenase  Daily,   R     08/16/19 2011   08/16/19 2009  Vitamin B12  Once,   STAT     08/16/19 2011   08/16/19 2009  Ammonia  Once,   STAT     08/16/19 2011   08/16/19 2009  TSH  Once,   STAT     08/16/19 2011   08/16/19 2003  HIV Antibody (routine testing w rflx)  (HIV Antibody (Routine testing w reflex) panel)  Once,   STAT     08/16/19 2011   08/16/19 2003  ABO/Rh  Once,   STAT     08/16/19 2011   08/16/19 1620  Blood Culture (routine x 2)  BLOOD CULTURE X 2,   STAT     08/16/19 1620          Vitals/Pain Today's Vitals   08/16/19 2030 08/16/19 2100 08/16/19 2200 08/16/19 2220  BP: (!) 149/137 (!) 159/87 (!) 167/106   Pulse: 86 77 82 78  Resp: (!) 21 16 (!) 23 16  Temp:      TempSrc:      SpO2: 94% 94% 92% 94%  Weight:      Height:      PainSc:        Isolation Precautions Airborne and Contact precautions  Medications Medications  remdesivir 200 mg in sodium chloride 0.9% 250 mL IVPB (200  mg Intravenous New Bag/Given 08/16/19 2222)    Followed by  remdesivir 100 mg in sodium chloride 0.9 % 100 mL IVPB (has no administration in time range)  aspirin EC tablet 81 mg (81 mg Oral Given 08/16/19 2229)  amLODipine (NORVASC) tablet 5 mg (has no administration in time range)  atorvastatin (LIPITOR) tablet 20 mg (has no administration in time range)  carvedilol (COREG) tablet 12.5 mg (12.5 mg Oral Given 08/16/19 2219)  venlafaxine XR (EFFEXOR-XR) 24 hr capsule 75 mg (has no administration in time range)  famotidine (PEPCID) tablet 20 mg (20 mg Oral Given 08/16/19 2219)  OXcarbazepine (TRILEPTAL) tablet 450 mg (450 mg Oral Given 08/16/19 2229)  multivitamin with minerals tablet 1 tablet (has no administration in time range)  loratadine (CLARITIN) tablet 10 mg (has no administration in time range)  fluticasone (FLONASE) 50 MCG/ACT nasal spray 1 spray (has no administration in time range)  enoxaparin (LOVENOX) injection 40 mg (40 mg Subcutaneous Given 08/16/19 2230)  albuterol (VENTOLIN HFA)  108 (90 Base) MCG/ACT inhaler 2 puff (has no administration in time range)  dexamethasone (DECADRON) injection 6 mg (has no administration in time range)  guaiFENesin-dextromethorphan (ROBITUSSIN DM) 100-10 MG/5ML syrup 10 mL (has no administration in time range)  ascorbic acid (VITAMIN C) tablet 500 mg (500 mg Oral Given 08/16/19 2219)  zinc sulfate capsule 220 mg (220 mg Oral Given 08/16/19 2219)  acetaminophen (TYLENOL) tablet 650 mg (has no administration in time range)  acetaminophen (TYLENOL) tablet 1,000 mg (1,000 mg Oral Given 08/16/19 1709)  dexamethasone (DECADRON) injection 10 mg (10 mg Intravenous Given 08/16/19 1955)  furosemide (LASIX) injection 20 mg (20 mg Intravenous Given 08/16/19 2217)    Mobility walks Low fall risk   Focused Assessments    R Recommendations: See Admitting Provider Note  Report given to: America Brown RN [phone]  Additional Notes:

## 2019-08-17 ENCOUNTER — Inpatient Hospital Stay (HOSPITAL_COMMUNITY): Payer: Medicare HMO

## 2019-08-17 ENCOUNTER — Encounter (HOSPITAL_COMMUNITY): Payer: Self-pay | Admitting: Internal Medicine

## 2019-08-17 DIAGNOSIS — I509 Heart failure, unspecified: Secondary | ICD-10-CM

## 2019-08-17 LAB — COMPREHENSIVE METABOLIC PANEL
ALT: 22 U/L (ref 0–44)
AST: 22 U/L (ref 15–41)
Albumin: 3.3 g/dL — ABNORMAL LOW (ref 3.5–5.0)
Alkaline Phosphatase: 123 U/L (ref 38–126)
Anion gap: 11 (ref 5–15)
BUN: 23 mg/dL (ref 8–23)
CO2: 33 mmol/L — ABNORMAL HIGH (ref 22–32)
Calcium: 8.8 mg/dL — ABNORMAL LOW (ref 8.9–10.3)
Chloride: 101 mmol/L (ref 98–111)
Creatinine, Ser: 1.18 mg/dL — ABNORMAL HIGH (ref 0.44–1.00)
GFR calc Af Amer: 57 mL/min — ABNORMAL LOW (ref >60)
GFR calc non Af Amer: 49 mL/min — ABNORMAL LOW (ref >60)
Glucose, Bld: 128 mg/dL — ABNORMAL HIGH (ref 70–99)
Potassium: 4.2 mmol/L (ref 3.5–5.1)
Sodium: 145 mmol/L (ref 135–145)
Total Bilirubin: 0.4 mg/dL (ref 0.3–1.2)
Total Protein: 7.4 g/dL (ref 6.5–8.1)

## 2019-08-17 LAB — CBC WITH DIFFERENTIAL/PLATELET
Abs Immature Granulocytes: 0.03 10*3/uL (ref 0.00–0.07)
Basophils Absolute: 0 10*3/uL (ref 0.0–0.1)
Basophils Relative: 0 %
Eosinophils Absolute: 0 10*3/uL (ref 0.0–0.5)
Eosinophils Relative: 0 %
HCT: 43.6 % (ref 36.0–46.0)
Hemoglobin: 13.1 g/dL (ref 12.0–15.0)
Immature Granulocytes: 1 %
Lymphocytes Relative: 6 %
Lymphs Abs: 0.4 10*3/uL — ABNORMAL LOW (ref 0.7–4.0)
MCH: 28.9 pg (ref 26.0–34.0)
MCHC: 30 g/dL (ref 30.0–36.0)
MCV: 96 fL (ref 80.0–100.0)
Monocytes Absolute: 0.2 10*3/uL (ref 0.1–1.0)
Monocytes Relative: 4 %
Neutro Abs: 5.1 10*3/uL (ref 1.7–7.7)
Neutrophils Relative %: 89 %
Platelets: 619 10*3/uL — ABNORMAL HIGH (ref 150–400)
RBC: 4.54 MIL/uL (ref 3.87–5.11)
RDW: 14.8 % (ref 11.5–15.5)
WBC: 5.7 10*3/uL (ref 4.0–10.5)
nRBC: 1.9 % — ABNORMAL HIGH (ref 0.0–0.2)

## 2019-08-17 LAB — ECHOCARDIOGRAM COMPLETE
Height: 62 in
Weight: 4159.99 oz

## 2019-08-17 LAB — LACTATE DEHYDROGENASE: LDH: 222 U/L — ABNORMAL HIGH (ref 98–192)

## 2019-08-17 LAB — TYPE AND SCREEN
ABO/RH(D): O POS
ABO/RH(D): O POS
Antibody Screen: NEGATIVE
Antibody Screen: NEGATIVE

## 2019-08-17 LAB — ABO/RH
ABO/RH(D): O POS
ABO/RH(D): O POS

## 2019-08-17 LAB — TSH: TSH: 1.218 u[IU]/mL (ref 0.350–4.500)

## 2019-08-17 LAB — D-DIMER, QUANTITATIVE: D-Dimer, Quant: 4.67 ug/mL-FEU — ABNORMAL HIGH (ref 0.00–0.50)

## 2019-08-17 LAB — VITAMIN B12: Vitamin B-12: 1161 pg/mL — ABNORMAL HIGH (ref 180–914)

## 2019-08-17 LAB — C-REACTIVE PROTEIN: CRP: 2.6 mg/dL — ABNORMAL HIGH (ref <1.0)

## 2019-08-17 LAB — AMMONIA: Ammonia: 25 umol/L (ref 9–35)

## 2019-08-17 LAB — HIV ANTIBODY (ROUTINE TESTING W REFLEX): HIV Screen 4th Generation wRfx: NONREACTIVE

## 2019-08-17 MED ORDER — ENOXAPARIN SODIUM 60 MG/0.6ML ~~LOC~~ SOLN
60.0000 mg | SUBCUTANEOUS | Status: DC
  Administered 2019-08-17 – 2019-08-18 (×2): 60 mg via SUBCUTANEOUS
  Filled 2019-08-17 (×2): qty 0.6

## 2019-08-17 MED ORDER — POLYVINYL ALCOHOL 1.4 % OP SOLN
1.0000 [drp] | OPHTHALMIC | Status: DC | PRN
  Administered 2019-08-17: 21:00:00 1 [drp] via OPHTHALMIC
  Filled 2019-08-17: qty 15

## 2019-08-17 MED ORDER — OXCARBAZEPINE 300 MG PO TABS
450.0000 mg | ORAL_TABLET | Freq: Two times a day (BID) | ORAL | Status: DC
  Administered 2019-08-17 – 2019-08-22 (×11): 450 mg via ORAL
  Filled 2019-08-17 (×14): qty 1.5

## 2019-08-17 NOTE — Progress Notes (Signed)
Pt arrived via strecher, awake, alert and oriented to self, place and time at the moment. N/C at 3lpm no s/s of resp distress or discomfort. Skin assessed with another RN, observed mild excoriation in groin and abd folds- applied powder. Peri care performed during this time and place new purewick. V/S elevated but trending down from prev hosp trends. Denies pain at this time. Oriented to call bell and room. Will cont to monitor.

## 2019-08-17 NOTE — Telephone Encounter (Signed)
Agreed -

## 2019-08-17 NOTE — Plan of Care (Signed)
  Problem: Education: Goal: Knowledge of risk factors and measures for prevention of condition will improve Outcome: Progressing   Problem: Coping: Goal: Psychosocial and spiritual needs will be supported Outcome: Progressing   Problem: Respiratory: Goal: Will maintain a patent airway Outcome: Progressing Goal: Complications related to the disease process, condition or treatment will be avoided or minimized Outcome: Progressing   

## 2019-08-17 NOTE — Plan of Care (Signed)
Plan of Care reviewed. 

## 2019-08-17 NOTE — Telephone Encounter (Signed)
Pt. Was seen at the ER last night. She tested positive for COVID. Spoke with her husband and made him and her aware. They agree to increase the Effexor. They think it will be helpful with her going through being positive for Covid. Is this still okay?

## 2019-08-17 NOTE — Progress Notes (Signed)
  Echocardiogram 2D Echocardiogram has been performed.  Jennette Dubin 08/17/2019, 8:50 AM

## 2019-08-17 NOTE — Progress Notes (Signed)
PROGRESS NOTE    Marie Ramos  [id number] DOB: Nov 06, 1955 DOA: 08/16/2019 PCP: Lawerance Cruel, MD   HPI: Marie Ramos is a 63 y.o. female with medical history significant of bipolar disorder, CVA, TBI, anoxic brain injury, HOCM, QT prolongation, history of V. Fib cardiac arrest status post AICD, hypertension, hyperlipidemia, history of DVT presenting to the ED for evaluation of altered mental status.  History limited as patient appears confused.  States she has bipolar disorder but her husband is concerned that for the past 2 days she has not been acting right.  Reports having shortness of breath but is not sure when it started.  No other complaints.  Denies fevers, chills, chest pain, nausea, vomiting, abdominal pain, or diarrhea.  No additional history could be obtained from the patient. In ED: Oxygen saturation 82% on room air, improved with 3 L supplemental oxygen. Chest x-ray showing patchy opacities in the periphery of the right mid upper lung suspicious for pneumonia/atypical viral infection.  Also showing cardiomegaly with vascular congestion. Patient received Tylenol, Decadron, and remdesivir.  Subjective: No acute issues or events overnight, patient indicates her respiratory status is improving but not yet back to baseline, markedly dyspneic with even minimal exertion, declines chest pain, nausea, vomiting, diarrhea, constipation, headache, fevers, chills.  Assessment & Plan:   Principal Problem:   Pneumonia due to COVID-19 virus Active Problems:   Acute respiratory failure (HCC)   Essential hypertension   Pulmonary vascular congestion   Acute metabolic encephalopathy   Assessment/Plan  Acute hypoxic respiratory failure in the setting of COVID-19 pneumonia, POA SpO2: 91 % O2 Flow Rate (L/min): 3 L/min FiO2 (%): (!) 3 % Continue to wean oxygen as tolerated Inflammatory labs as below Continue remdesivir, Decadron per protocol Albuterol as necessary Continue  to prone incentive spirometry and early ambulation as tolerated Continue vitamin C, zinc, antitussives, supportive care Recent Labs    08/16/19 1654 08/17/19 0500  DDIMER 1.97* 4.67*  FERRITIN 19  --   LDH 272* 222*  CRP 3.7* 2.6*   Pulmonary vascular congestion, history of grade 1 diastolic dysfunction, questionably an exacerbation, improving No documented history of CHF.  Chest x-ray showing cardiomegaly with vascular congestion.  BNP slightly elevated.  Last echo done January 2018 with LVEF 55 to [id number], grade 1 diastolic dysfunction, and mild LV hypertrophy. -Cardiac monitoring -IV Lasix 20 mg once appears more euvolemic today -Echocardiogram pending -Monitor intake and output, daily weights, low-sodium diet with fluid restriction  Acute metabolic encephalopathy, likely secondary to above, improving Related to COVID-19 infection Without focal neuro deficit appreciated on exam Resolving with treatment of viral infection as above TSH, B12 within normal limits  Chronic thrombocytosis Platelet count 708, chronically elevated.  No documented history of tobacco use. -Please ensure hematology follow-up for further work-up.  Hyperlipidemia -Continue home Lipitor  Hypertension -More well controlled today -Continue home amlodipine, Coreg -Continue hydralazine PRN  Bipolar disorder -Continue home Trileptal, Effexor  DVT prophylaxis: Lovenox Code Status: Full code Family Communication: No family available at this time. Disposition Plan: Anticipate discharge after clinical improvement. Consults called: None Admission status patient continues as inpatient status, continues to require supplemental oxygen well above baseline, IV antivirals and treatment, remains high risk for decompensation given age, comorbid conditions and acute illness.   Objective: Vitals:   08/17/19 0632 08/17/19 0738 08/17/19 1200 08/17/19 1626  BP: (!) 140/117 (!) 146/86 133/78 (!) 146/81  Pulse: 72  82 67 72  Resp: 18 17 19 16   Temp:  98.4 F (36.9 C) (!) 97.4 F (36.3 C) 98.1 F (36.7 C) 98.3 F (36.8 C)  TempSrc: Oral Oral Oral Oral  SpO2: 94% 94% 92% 91%  Weight:      Height:        Intake/Output Summary (Last 24 hours) at 08/17/2019 1657 Last data filed at 08/17/2019 1500 Gross per 24 hour  Intake 1950 ml  Output --  Net 1950 ml   Filed Weights   08/16/19 1513  Weight: 117.9 kg    Examination:  General exam: Appears calm and comfortable  Respiratory system: Clear to auscultation. Respiratory effort normal. Cardiovascular system: S1 & S2 heard, RRR. No JVD, murmurs, rubs, gallops or clicks. No pedal edema. Gastrointestinal system: Abdomen is nondistended, soft and nontender. No organomegaly or masses felt. Normal bowel sounds heard. Central nervous system: Alert and oriented. No focal neurological deficits. Extremities: Symmetric 5 x 5 power. Skin: No rashes, lesions or ulcers Psychiatry: Judgement and insight appear normal. Mood & affect appropriate.     Data Reviewed: I have personally reviewed following labs and imaging studies  CBC: Recent Labs  Lab 08/16/19 1654 08/17/19 0500  WBC 8.2 5.7  NEUTROABS 6.2 5.1  HGB 13.4 13.1  HCT 43.5 43.6  MCV 94.2 96.0  PLT 708* [id number]*   Basic Metabolic Panel: Recent Labs  Lab 08/16/19 1654 08/17/19 0500  NA 144 145  K 4.1 4.2  CL 97* 101  CO2 33* 33*  GLUCOSE 84 128*  BUN 23 23  CREATININE 1.19* 1.18*  CALCIUM 9.5 8.8*   GFR: Estimated Creatinine Clearance: 59.5 mL/min (A) (by C-G formula based on SCr of 1.18 mg/dL (H)). Liver Function Tests: Recent Labs  Lab 08/16/19 1654 08/17/19 0500  AST 27 22  ALT 26 22  ALKPHOS 125 123  BILITOT 0.5 0.4  PROT 7.7 7.4  ALBUMIN 3.4* 3.3*   No results for input(s): LIPASE, AMYLASE in the last 168 hours. Recent Labs  Lab 08/16/19 2009  AMMONIA 25   Coagulation Profile: No results for input(s): INR, PROTIME in the last 168 hours. Cardiac Enzymes: No  results for input(s): CKTOTAL, CKMB, CKMBINDEX, TROPONINI in the last 168 hours. BNP (last 3 results) No results for input(s): PROBNP in the last 8760 hours. HbA1C: No results for input(s): HGBA1C in the last 72 hours. CBG: No results for input(s): GLUCAP in the last 168 hours. Lipid Profile: Recent Labs    08/16/19 1654  TRIG 128   Thyroid Function Tests: Recent Labs    08/16/19 2009  TSH 1.218   Anemia Panel: Recent Labs    08/16/19 1654 08/16/19 2009  VITAMINB12  --  1,161*  FERRITIN 19  --    Sepsis Labs: Recent Labs  Lab 08/16/19 1654 08/16/19 1820  PROCALCITON <0.10  --   LATICACIDVEN 1.0 1.7    Recent Results (from the past 240 hour(s))  Respiratory Panel by RT PCR (Flu A&B, Covid) - Nasopharyngeal Swab     Status: Abnormal   Collection Time: 08/16/19  4:21 PM   Specimen: Nasopharyngeal Swab  Result Value Ref Range Status   SARS Coronavirus 2 by RT PCR POSITIVE (A) NEGATIVE Final    Comment: RESULT CALLED TO, READ BACK BY AND VERIFIED WITH: OXENDINE,J. RN @1917  ON 02.09.2021 BY COHEN,K (NOTE) SARS-CoV-2 target nucleic acids are DETECTED. SARS-CoV-2 RNA is generally detectable in upper respiratory specimens  during the acute phase of infection. Positive results are indicative of the presence of the identified virus, but do not rule out  bacterial infection or co-infection with other pathogens not detected by the test. Clinical correlation with patient history and other diagnostic information is necessary to determine patient infection status. The expected result is Negative. Fact Sheet for Patients:  PinkCheek.be Fact Sheet for Healthcare Providers: GravelBags.it This test is not yet approved or cleared by the [place] FDA and  has been authorized for detection and/or diagnosis of SARS-CoV-2 by FDA under an Emergency Use Authorization (EUA).  This EUA will remain in effect (meaning this test  can  be used) for the duration of  the COVID-19 declaration under Section 564(b)(1) of the Act, 21 U.S.C. section 360bbb-3(b)(1), unless the authorization is terminated or revoked sooner.    Influenza A by PCR NEGATIVE NEGATIVE Final   Influenza B by PCR NEGATIVE NEGATIVE Final    Comment: (NOTE) The Xpert Xpress SARS-CoV-2/FLU/RSV assay is intended as an aid in  the diagnosis of influenza from Nasopharyngeal swab specimens and  should not be used as a sole basis for treatment. Nasal washings and  aspirates are unacceptable for Xpert Xpress SARS-CoV-2/FLU/RSV  testing. Fact Sheet for Patients: PinkCheek.be Fact Sheet for Healthcare Providers: GravelBags.it This test is not yet approved or cleared by the [place] FDA and  has been authorized for detection and/or diagnosis of SARS-CoV-2 by  FDA under an Emergency Use Authorization (EUA). This EUA will remain  in effect (meaning this test can be used) for the duration of the  Covid-19 declaration under Section 564(b)(1) of the Act, 21  U.S.C. section 360bbb-3(b)(1), unless the authorization is  terminated or revoked. Performed at [facility], [place] [address], [place], [place] [address]   Blood Culture (routine x 2)     Status: None (Preliminary result)   Collection Time: 08/16/19  4:50 PM   Specimen: BLOOD  Result Value Ref Range Status   Specimen Description   Final    BLOOD RIGHT ANTECUBITAL Performed at [place] [address], [place], [place] [address]    Special Requests   Final    BOTTLES DRAWN AEROBIC AND ANAEROBIC Blood Culture adequate volume Performed at [facility] [place] [address] [place], [place] [address]    Culture   Final    NO GROWTH < 12 HOURS Performed at [facility] [address] [place], [place] [address]    Report Status PENDING  Incomplete  Blood Culture (routine x 2)      Status: None (Preliminary result)   Collection Time: 08/16/19  4:54 PM   Specimen: BLOOD  Result Value Ref Range Status   Specimen Description   Final    BLOOD LEFT WRIST Performed at [place] [address] [place], [place] [address]    Special Requests   Final    BOTTLES DRAWN AEROBIC AND ANAEROBIC Blood Culture adequate volume Performed at [facility] [address], [place], [place] [address]    Culture   Final    NO GROWTH < 12 HOURS Performed at [facility] [address] [place],  [address]    Report Status PENDING  Incomplete         Radiology Studies: CT HEAD WO CONTRAST  Result Date: 08/16/2019 CLINICAL DATA:  Altered mental status EXAM: CT HEAD WITHOUT CONTRAST TECHNIQUE: Contiguous axial images were obtained from the base of the skull through the vertex without intravenous contrast. COMPARISON:  08/24/2015 FINDINGS: Brain: No acute intracranial abnormality. Specifically, no hemorrhage, hydrocephalus,  mass lesion, acute infarction, or significant intracranial injury. Vascular: No hyperdense vessel or unexpected calcification. Skull: No acute calvarial abnormality. Sinuses/Orbits: Visualized paranasal sinuses and mastoids clear. Orbital soft tissues unremarkable. Other: None IMPRESSION: No intracranial abnormality. Electronically Signed   By: Rolm Baptise M.D.   On: 08/16/2019 21:38   DG Chest Port 1 View  Result Date: 08/16/2019 CLINICAL DATA:  Hypoxia. Flu positive. Decreased appetite and loss of smell. EXAM: PORTABLE CHEST 1 VIEW COMPARISON:  Radiograph 09/04/2015 FINDINGS: Dual lead left-sided pacemaker in place. Mild cardiomegaly is similar to prior. Unchanged mediastinal contours. Vascular congestion. Suggestion of patchy opacities in the periphery of the right mid upper lung zone. No large pleural effusion. No pneumothorax. Detailed evaluation is partially obscured by soft tissue attenuation from habitus.  IMPRESSION: 1. Patchy opacities in the periphery of the right mid upper lung zone, suspicious for pneumonia, including atypical viral infection. 2. Cardiomegaly with vascular congestion. Electronically Signed   By: Keith Rake M.D.   On: 08/16/2019 16:37   ECHOCARDIOGRAM COMPLETE  Result Date: 08/17/2019    ECHOCARDIOGRAM REPORT   Patient Name:   BETTYE AMMAN      Date of Exam: 08/17/2019 Medical Rec #:  [id number]              Height:       62.0 in Accession #:    [id number]             Weight:       260.0 lb Date of Birth:  10-11-1955              BSA:          2.14 m Patient Age:    35 years               BP:           146/86 mmHg Patient Gender: F                      HR:           82 bpm. Exam Location:  [facility]patient [facility] Procedure: 2D Echo Indications:    CHF  History:        Patient has prior history of Echocardiogram examinations, most                 recent 07/14/2016. COVID-19 Positive.  Sonographer:    Mikki Santee RDCS (AE) Referring Phys: [id number] [place]  1. Left ventricular ejection fraction, by estimation, is 65 to 70%. The left ventricle has normal function. There is moderately increased left ventricular hypertrophy of the basal septum. LV diastolic function not assessed.  2. Right ventricular systolic function was not well visualized. The right ventricular size is not well visualized. Tricuspid regurgitation signal is inadequate for assessing PA pressure.  3. The mitral valve is normal in structure and function. no evidence of mitral valve regurgitation. No evidence of mitral stenosis.  4. The aortic valve was not well visualized. Aortic valve regurgitation is trivial.  5. The inferior vena cava is normal in size with <50% respiratory variability, suggesting right atrial pressure of 8 mmHg.  6. No left atrial/left atrial appendage thrombus was detected. Conclusion(s)/Recomendation(s): Poor windows for evaluation of left ventricular function regional wall  motion by transthoracic echocardiography. Would recommend limited study with definity if clinically indicated. FINDINGS  Left Ventricle: Left ventricular ejection fraction, by estimation, is 60 to 65%. The left ventricle has normal function. The left  ventricle is not well visualized. The left ventricular internal cavity size was normal in size. There is moderately increased. There is moderately increased left ventricular hypertrophy of the basal-septal segment. Left ventricular diastolic function could not be evaluated. Right Ventricle: The right ventricular size is not well visualized. Right vetricular wall thickness was not assessed. Right ventricular systolic function was not well visualized. Tricuspid regurgitation signal is inadequate for assessing PA pressure. Left Atrium: Left atrial size was not well visualized. Right Atrium: Right atrial size was not well visualized. Pericardium: There is no evidence of pericardial effusion. Mitral Valve: The mitral valve is normal in structure and function. Normal mobility of the mitral valve leaflets. Mild mitral annular calcification. No evidence of mitral valve regurgitation. No evidence of mitral valve stenosis. Tricuspid Valve: The tricuspid valve is normal in structure. Tricuspid valve regurgitation is not demonstrated. No evidence of tricuspid stenosis. Aortic Valve: The aortic valve was not well visualized. Aortic valve regurgitation is trivial. Pulmonic Valve: The pulmonic valve was normal in structure. Pulmonic valve regurgitation is not visualized. No evidence of pulmonic stenosis. Aorta: The aortic root is normal in size and structure. Venous: The inferior vena cava is normal in size with less than 50% respiratory variability, suggesting right atrial pressure of 8 mmHg. The inferior vena cava and the hepatic vein show a normal flow pattern. IAS/Shunts: No atrial level shunt detected by color flow Doppler. Additional Comments: A pacer wire is visualized.  LEFT  VENTRICLE PLAX 2D LVIDd:         3.79 cm  Diastology LVIDs:         2.47 cm  LV e' lateral: 5.11 cm/s LV PW:         1.23 cm  LV e' medial:  4.68 cm/s LV IVS:        1.34 cm LVOT diam:     2.30 cm LV SV:         68.55 ml LV SV Index:   17.02 LVOT Area:     4.15 cm  LEFT ATRIUM             Index       RIGHT ATRIUM           Index LA diam:        3.00 cm 1.40 cm/m  RA Area:     17.60 cm LA Vol (A2C):   52.0 ml 24.33 ml/m RA Volume:   40.00 ml  18.72 ml/m LA Vol (A4C):   53.0 ml 24.80 ml/m LA Biplane Vol: 52.5 ml 24.56 ml/m  AORTIC VALVE LVOT Vmax:   71.80 cm/s LVOT Vmean:  43.100 cm/s LVOT VTI:    0.165 m  AORTA Ao Root diam: 3.00 cm  SHUNTS Systemic VTI:  0.16 m Systemic Diam: 2.30 cm Fransico Him MD Electronically signed by Fransico Him MD Signature Date/Time: 08/17/2019/10:21:51 AM    Final         Scheduled Meds:  amLODipine  5 mg Oral Q breakfast   vitamin C  500 mg Oral Daily   aspirin EC  81 mg Oral Daily   atorvastatin  20 mg Oral q1800   carvedilol  12.5 mg Oral BID WC   dexamethasone (DECADRON) injection  6 mg Intravenous Q24H   enoxaparin (LOVENOX) injection  60 mg Subcutaneous Q24H   famotidine  20 mg Oral QHS   fluticasone  1 spray Each Nare Daily   loratadine  10 mg Oral Daily   multivitamin with minerals  1  tablet Oral Daily   Oxcarbazepine  450 mg Oral BID   venlafaxine XR  75 mg Oral Q breakfast   zinc sulfate  220 mg Oral Daily   Continuous Infusions:  remdesivir 100 mg in NS 100 mL 100 mg (08/17/19 1105)     LOS: 1 day   Time spent: >51min  Remer Couse C Sheralyn Pinegar, DO [facility]  If 7PM-7AM, please contact night-coverage [email or web address]  08/17/2019, 4:57 PM

## 2019-08-18 LAB — COMPREHENSIVE METABOLIC PANEL
ALT: 22 U/L (ref 0–44)
AST: 19 U/L (ref 15–41)
Albumin: 3 g/dL — ABNORMAL LOW (ref 3.5–5.0)
Alkaline Phosphatase: 108 U/L (ref 38–126)
Anion gap: 10 (ref 5–15)
BUN: 37 mg/dL — ABNORMAL HIGH (ref 8–23)
CO2: 32 mmol/L (ref 22–32)
Calcium: 9 mg/dL (ref 8.9–10.3)
Chloride: 98 mmol/L (ref 98–111)
Creatinine, Ser: 1.27 mg/dL — ABNORMAL HIGH (ref 0.44–1.00)
GFR calc Af Amer: 52 mL/min — ABNORMAL LOW (ref >60)
GFR calc non Af Amer: 45 mL/min — ABNORMAL LOW (ref >60)
Glucose, Bld: 96 mg/dL (ref 70–99)
Potassium: 3.9 mmol/L (ref 3.5–5.1)
Sodium: 140 mmol/L (ref 135–145)
Total Bilirubin: 0.5 mg/dL (ref 0.3–1.2)
Total Protein: 6.8 g/dL (ref 6.5–8.1)

## 2019-08-18 LAB — CBC WITH DIFFERENTIAL/PLATELET
Abs Immature Granulocytes: 0.04 10*3/uL (ref 0.00–0.07)
Basophils Absolute: 0 10*3/uL (ref 0.0–0.1)
Basophils Relative: 0 %
Eosinophils Absolute: 0 10*3/uL (ref 0.0–0.5)
Eosinophils Relative: 0 %
HCT: 38.9 % (ref 36.0–46.0)
Hemoglobin: 11.7 g/dL — ABNORMAL LOW (ref 12.0–15.0)
Immature Granulocytes: 1 %
Lymphocytes Relative: 12 %
Lymphs Abs: 1 10*3/uL (ref 0.7–4.0)
MCH: 28.7 pg (ref 26.0–34.0)
MCHC: 30.1 g/dL (ref 30.0–36.0)
MCV: 95.6 fL (ref 80.0–100.0)
Monocytes Absolute: 1.1 10*3/uL — ABNORMAL HIGH (ref 0.1–1.0)
Monocytes Relative: 13 %
Neutro Abs: 6.4 10*3/uL (ref 1.7–7.7)
Neutrophils Relative %: 74 %
Platelets: 627 10*3/uL — ABNORMAL HIGH (ref 150–400)
RBC: 4.07 MIL/uL (ref 3.87–5.11)
RDW: 14.6 % (ref 11.5–15.5)
WBC: 8.6 10*3/uL (ref 4.0–10.5)
nRBC: 1.4 % — ABNORMAL HIGH (ref 0.0–0.2)

## 2019-08-18 LAB — GLUCOSE, CAPILLARY: Glucose-Capillary: 143 mg/dL — ABNORMAL HIGH (ref 70–99)

## 2019-08-18 LAB — D-DIMER, QUANTITATIVE: D-Dimer, Quant: 6.03 ug/mL-FEU — ABNORMAL HIGH (ref 0.00–0.50)

## 2019-08-18 LAB — C-REACTIVE PROTEIN: CRP: 1.3 mg/dL — ABNORMAL HIGH (ref <1.0)

## 2019-08-18 LAB — LACTATE DEHYDROGENASE: LDH: 239 U/L — ABNORMAL HIGH (ref 98–192)

## 2019-08-18 NOTE — Progress Notes (Signed)
Providing support with Marie Ramos in response to spiritual care consult.    Marie Ramos is Jewish - a member at [employer].  She has been able to contact her Rabbi via phone and has received prayers and support.  Chaplain provided support at bedside, engaging in life review, support around illness and hospitalization.   During conversation, Marie Ramos is mildly confused - evidenced by some tangentiality.  Marie Ramos indicates that she is scared, hopeful to return home soon, but voices awareness of her need to be in hospital.   She is well supported by family and received several calls from son and sister in law while chaplain was in room.  Chaplain will provide spiritual support in house as needed.   Jerene Pitch, MDiv, [facility]  Lead Clinical Chaplain  [place]

## 2019-08-18 NOTE — Progress Notes (Signed)
PROGRESS NOTE    Marie Ramos  [id number] DOB: [birth year] DOA: 08/16/2019 PCP: Lawerance Cruel, MD   HPI: Marie Ramos is a 64 y.o. female with medical history significant of bipolar disorder, CVA, TBI, anoxic brain injury, HOCM, QT prolongation, history of V. Fib cardiac arrest status post AICD, hypertension, hyperlipidemia, history of DVT presenting to the ED for evaluation of altered mental status.  History limited as patient appears confused.  States she has bipolar disorder but her husband is concerned that for the past 2 days she has not been acting right.  Reports having shortness of breath but is not sure when it started.  No other complaints.  Denies fevers, chills, chest pain, nausea, vomiting, abdominal pain, or diarrhea.  No additional history could be obtained from the patient. In ED: Oxygen saturation 82% on room air, improved with 3 L supplemental oxygen. Chest x-ray showing patchy opacities in the periphery of the right mid upper lung suspicious for pneumonia/atypical viral infection.  Also showing cardiomegaly with vascular congestion. Patient received Tylenol, Decadron, and remdesivir.  Subjective: No acute issues or events overnight, patient indicates her respiratory status is improving but not yet back to baseline, markedly dyspneic with even minimal exertion, declines chest pain, nausea, vomiting, diarrhea, constipation, headache, fevers, chills.  More awake alert oriented this morning than previous.  Assessment & Plan:   Principal Problem:   Pneumonia due to COVID-19 virus Active Problems:   Acute respiratory failure (HCC)   Essential hypertension   Pulmonary vascular congestion   Acute metabolic encephalopathy   Assessment/Plan   Acute hypoxic respiratory failure in the setting of COVID-19 pneumonia, POA SpO2: 91 % O2 Flow Rate (L/min): 2 L/min FiO2 (%): (!) 3 % Continue to wean oxygen as tolerated Inflammatory labs as below Continue remdesivir,  Decadron per protocol - stop date 2/13 and 2/17 respectively Albuterol as necessary Continue to prone incentive spirometry and early ambulation as tolerated Continue vitamin C, zinc, antitussives, supportive care Recent Labs    08/16/19 1654 08/17/19 0500 08/18/19 0315  DDIMER 1.97* 4.67* 6.03*  FERRITIN 19  --   --   LDH 272* 222* 239*  CRP 3.7* 2.6* 1.3*   Pulmonary vascular congestion, history of grade 1 diastolic dysfunction, questionably an exacerbation, improving -Chest x-ray showing cardiomegaly with vascular congestion.  BNP slightly elevated.   -Last echo done January 2018 with LVEF 55 to [id number], grade 1 diastolic dysfunction, and mild LV hypertrophy. -Echo as below poor study - unable to assess diastolic function; EF [id number] -Cardiac monitoring -IV Lasix 20 mg once appears more euvolemic today -Monitor intake and output, daily weights, low-sodium diet with fluid restriction  Acute metabolic encephalopathy, likely secondary to above, resolving -Related to COVID-19 infection -Without focal neuro deficit appreciated on exam -Resolving with treatment of viral infection as above - awake alert oriented this morning -TSH, B12 within normal limits  Chronic thrombocytosis -Platelet count 600s, chronically elevated (baseline 500).  -Follow up outpatient for further workup  Hyperlipidemia -Continue home Lipitor  Hypertension -More well controlled today -Continue home amlodipine, Coreg -Continue hydralazine PRN  Bipolar disorder -Continue home Trileptal, Effexor  DVT prophylaxis: Lovenox Code Status: Full code Family Communication: Patient to update Disposition Plan: Anticipate discharge after clinical improvement. Consults called: None Admission status patient continues as inpatient status, continues to require supplemental oxygen well above baseline, IV antivirals and treatment, remains high risk for decompensation given age, comorbid conditions and acute  illness.   Objective: Vitals:   08/18/19 0400  08/18/19 0755 08/18/19 0912 08/18/19 1141  BP: (!) 150/78 (!) 164/86  121/65  Pulse: 61  74 63  Resp: 14   (!) 22  Temp: 98.6 F (37 C) 98 F (36.7 C)  98.5 F (36.9 C)  TempSrc: Oral Oral  Oral  SpO2: 92%   91%  Weight:      Height:        Intake/Output Summary (Last 24 hours) at 08/18/2019 1533 Last data filed at 08/18/2019 1000 Gross per 24 hour  Intake 440 ml  Output 650 ml  Net -210 ml   Filed Weights   08/16/19 1513  Weight: 117.9 kg    Examination:  General exam: Appears calm and comfortable, awake alert oriented x3 Respiratory system: Clear to auscultation. Respiratory effort normal. Cardiovascular system: S1 & S2 heard, RRR. No JVD, murmurs, rubs, gallops or clicks. No pedal edema. Gastrointestinal system: Abdomen is nondistended, soft and nontender. No organomegaly or masses felt. Normal bowel sounds heard. Central nervous system: Alert and oriented. No focal neurological deficits. Extremities: Symmetric 5 x 5 power. Skin: No rashes, lesions or ulcers   Data Reviewed: I have personally reviewed following labs and imaging studies  CBC: Recent Labs  Lab 08/16/19 1654 08/17/19 0500 08/18/19 0315  WBC 8.2 5.7 8.6  NEUTROABS 6.2 5.1 6.4  HGB 13.4 13.1 11.7*  HCT 43.5 43.6 38.9  MCV 94.2 96.0 95.6  PLT 708* 619* [id number]   Basic Metabolic Panel: Recent Labs  Lab 08/16/19 1654 08/17/19 0500 08/18/19 0315  NA 144 145 140  K 4.1 4.2 3.9  CL 97* 101 98  CO2 33* 33* 32  GLUCOSE 84 128* 96  BUN 23 23 37*  CREATININE 1.19* 1.18* 1.27*  CALCIUM 9.5 8.8* 9.0   GFR: Estimated Creatinine Clearance: 55.3 mL/min (A) (by C-G formula based on SCr of 1.27 mg/dL (H)). Liver Function Tests: Recent Labs  Lab 08/16/19 1654 08/17/19 0500 08/18/19 0315  AST 27 22 19   ALT 26 22 22   ALKPHOS 125 123 108  BILITOT 0.5 0.4 0.5  PROT 7.7 7.4 6.8  ALBUMIN 3.4* 3.3* 3.0*   No results for input(s): LIPASE, AMYLASE in  the last 168 hours. Recent Labs  Lab 08/16/19 2009  AMMONIA 25   Coagulation Profile: No results for input(s): INR, PROTIME in the last 168 hours. Cardiac Enzymes: No results for input(s): CKTOTAL, CKMB, CKMBINDEX, TROPONINI in the last 168 hours. BNP (last 3 results) No results for input(s): PROBNP in the last 8760 hours. HbA1C: No results for input(s): HGBA1C in the last 72 hours. CBG: No results for input(s): GLUCAP in the last 168 hours. Lipid Profile: Recent Labs    08/16/19 1654  TRIG 128   Thyroid Function Tests: Recent Labs    08/16/19 2009  TSH 1.218   Anemia Panel: Recent Labs    08/16/19 1654 08/16/19 2009  VITAMINB12  --  1,161*  FERRITIN 19  --    Sepsis Labs: Recent Labs  Lab 08/16/19 1654 08/16/19 1820  PROCALCITON <0.10  --   LATICACIDVEN 1.0 1.7    Recent Results (from the past 240 hour(s))  Respiratory Panel by RT PCR (Flu A&B, Covid) - Nasopharyngeal Swab     Status: Abnormal   Collection Time: 08/16/19  4:21 PM   Specimen: Nasopharyngeal Swab  Result Value Ref Range Status   SARS Coronavirus 2 by RT PCR POSITIVE (A) NEGATIVE Final    Comment: RESULT CALLED TO, READ BACK BY AND VERIFIED WITH: OXENDINE,J. RN @1917   ON 02.09.2021 BY COHEN,K (NOTE) SARS-CoV-2 target nucleic acids are DETECTED. SARS-CoV-2 RNA is generally detectable in upper respiratory specimens  during the acute phase of infection. Positive results are indicative of the presence of the identified virus, but do not rule out bacterial infection or co-infection with other pathogens not detected by the test. Clinical correlation with patient history and other diagnostic information is necessary to determine patient infection status. The expected result is Negative. Fact Sheet for Patients:  PinkCheek.be Fact Sheet for Healthcare Providers: GravelBags.it This test is not yet approved or cleared by the [place]  FDA and  has been authorized for detection and/or diagnosis of SARS-CoV-2 by FDA under an Emergency Use Authorization (EUA).  This EUA will remain in effect (meaning this test can  be used) for the duration of  the COVID-19 declaration under Section 564(b)(1) of the Act, 21 U.S.C. section 360bbb-3(b)(1), unless the authorization is terminated or revoked sooner.    Influenza A by PCR NEGATIVE NEGATIVE Final   Influenza B by PCR NEGATIVE NEGATIVE Final    Comment: (NOTE) The Xpert Xpress SARS-CoV-2/FLU/RSV assay is intended as an aid in  the diagnosis of influenza from Nasopharyngeal swab specimens and  should not be used as a sole basis for treatment. Nasal washings and  aspirates are unacceptable for Xpert Xpress SARS-CoV-2/FLU/RSV  testing. Fact Sheet for Patients: PinkCheek.be Fact Sheet for Healthcare Providers: GravelBags.it This test is not yet approved or cleared by the [place] FDA and  has been authorized for detection and/or diagnosis of SARS-CoV-2 by  FDA under an Emergency Use Authorization (EUA). This EUA will remain  in effect (meaning this test can be used) for the duration of the  Covid-19 declaration under Section 564(b)(1) of the Act, 21  U.S.C. section 360bbb-3(b)(1), unless the authorization is  terminated or revoked. Performed at [facility], [place] [address] [place], [place] [address]   Blood Culture (routine x 2)     Status: None (Preliminary result)   Collection Time: 08/16/19  4:50 PM   Specimen: BLOOD  Result Value Ref Range Status   Specimen Description   Final    BLOOD RIGHT ANTECUBITAL Performed at [facility] [address], [place], [place] [address]    Special Requests   Final    BOTTLES DRAWN AEROBIC AND ANAEROBIC Blood Culture adequate volume Performed at [facility] [address] [place], [place] [address]    Culture    Final    NO GROWTH 2 DAYS Performed at [facility] [address], [place], [place] [address]    Report Status PENDING  Incomplete  Blood Culture (routine x 2)     Status: None (Preliminary result)   Collection Time: 08/16/19  4:54 PM   Specimen: BLOOD  Result Value Ref Range Status   Specimen Description   Final    BLOOD LEFT WRIST Performed at [facility] [address], [place], [place] [address]    Special Requests   Final    BOTTLES DRAWN AEROBIC AND ANAEROBIC Blood Culture adequate volume Performed at [facility] [address], [place], [place] [address]    Culture   Final    NO GROWTH 2 DAYS Performed at [facility] [address] [place], [place] [address]    Report Status PENDING  Incomplete     Radiology Studies: CT HEAD WO CONTRAST  Result Date: 08/16/2019 CLINICAL DATA:  Altered mental  status EXAM: CT HEAD WITHOUT CONTRAST TECHNIQUE: Contiguous axial images were obtained from the base of the skull through the vertex without intravenous contrast. COMPARISON:  08/24/2015 FINDINGS: Brain: No acute intracranial abnormality. Specifically, no hemorrhage, hydrocephalus, mass lesion, acute infarction, or significant intracranial injury. Vascular: No hyperdense vessel or unexpected calcification. Skull: No acute calvarial abnormality. Sinuses/Orbits: Visualized paranasal sinuses and mastoids clear. Orbital soft tissues unremarkable. Other: None IMPRESSION: No intracranial abnormality. Electronically Signed   By: Rolm Baptise M.D.   On: 08/16/2019 21:38   DG Chest Port 1 View  Result Date: 08/16/2019 CLINICAL DATA:  Hypoxia. Flu positive. Decreased appetite and loss of smell. EXAM: PORTABLE CHEST 1 VIEW COMPARISON:  Radiograph 09/04/2015 FINDINGS: Dual lead left-sided pacemaker in place. Mild cardiomegaly is similar to prior. Unchanged mediastinal contours. Vascular congestion. Suggestion of patchy opacities in the periphery  of the right mid upper lung zone. No large pleural effusion. No pneumothorax. Detailed evaluation is partially obscured by soft tissue attenuation from habitus. IMPRESSION: 1. Patchy opacities in the periphery of the right mid upper lung zone, suspicious for pneumonia, including atypical viral infection. 2. Cardiomegaly with vascular congestion. Electronically Signed   By: Keith Rake M.D.   On: 08/16/2019 16:37   ECHOCARDIOGRAM COMPLETE  Result Date: 08/17/2019    ECHOCARDIOGRAM REPORT   Patient Name:   Marie Ramos      Date of Exam: 08/17/2019 Medical Rec #:  [id number]              Height:       62.0 in Accession #:    [id number]             Weight:       260.0 lb Date of Birth:  Feb 28, 1956              BSA:          2.14 m Patient Age:    62 years               BP:           146/86 mmHg Patient Gender: F                      HR:           82 bpm. Exam Location:  Inpatient [name] Procedure: 2D Echo Indications:    CHF  History:        Patient has prior history of Echocardiogram examinations, most                 recent 07/14/2016. COVID-19 Positive.  Sonographer:    Mikki Santee RDCS (AE) Referring Phys: [id number] Nara Visa  1. Left ventricular ejection fraction, by estimation, is 65 to 70%. The left ventricle has normal function. There is moderately increased left ventricular hypertrophy of the basal septum. LV diastolic function not assessed.  2. Right ventricular systolic function was not well visualized. The right ventricular size is not well visualized. Tricuspid regurgitation signal is inadequate for assessing PA pressure.  3. The mitral valve is normal in structure and function. no evidence of mitral valve regurgitation. No evidence of mitral stenosis.  4. The aortic valve was not well visualized. Aortic valve regurgitation is trivial.  5. The inferior vena cava is normal in size with <50% respiratory variability, suggesting right atrial pressure of 8 mmHg.  6. No  left atrial/left atrial appendage thrombus was detected. Conclusion(s)/Recomendation(s): Poor windows for evaluation of left ventricular function  regional wall motion by transthoracic echocardiography. Would recommend limited study with definity if clinically indicated. FINDINGS  Left Ventricle: Left ventricular ejection fraction, by estimation, is 60 to 65%. The left ventricle has normal function. The left ventricle is not well visualized. The left ventricular internal cavity size was normal in size. There is moderately increased. There is moderately increased left ventricular hypertrophy of the basal-septal segment. Left ventricular diastolic function could not be evaluated. Right Ventricle: The right ventricular size is not well visualized. Right vetricular wall thickness was not assessed. Right ventricular systolic function was not well visualized. Tricuspid regurgitation signal is inadequate for assessing PA pressure. Left Atrium: Left atrial size was not well visualized. Right Atrium: Right atrial size was not well visualized. Pericardium: There is no evidence of pericardial effusion. Mitral Valve: The mitral valve is normal in structure and function. Normal mobility of the mitral valve leaflets. Mild mitral annular calcification. No evidence of mitral valve regurgitation. No evidence of mitral valve stenosis. Tricuspid Valve: The tricuspid valve is normal in structure. Tricuspid valve regurgitation is not demonstrated. No evidence of tricuspid stenosis. Aortic Valve: The aortic valve was not well visualized. Aortic valve regurgitation is trivial. Pulmonic Valve: The pulmonic valve was normal in structure. Pulmonic valve regurgitation is not visualized. No evidence of pulmonic stenosis. Aorta: The aortic root is normal in size and structure. Venous: The inferior vena cava is normal in size with less than 50% respiratory variability, suggesting right atrial pressure of 8 mmHg. The inferior vena cava and the  hepatic vein show a normal flow pattern. IAS/Shunts: No atrial level shunt detected by color flow Doppler. Additional Comments: A pacer wire is visualized.  LEFT VENTRICLE PLAX 2D LVIDd:         3.79 cm  Diastology LVIDs:         2.47 cm  LV e' lateral: 5.11 cm/s LV PW:         1.23 cm  LV e' medial:  4.68 cm/s LV IVS:        1.34 cm LVOT diam:     2.30 cm LV SV:         68.55 ml LV SV Index:   17.02 LVOT Area:     4.15 cm  LEFT ATRIUM             Index       RIGHT ATRIUM           Index LA diam:        3.00 cm 1.40 cm/m  RA Area:     17.60 cm LA Vol (A2C):   52.0 ml 24.33 ml/m RA Volume:   40.00 ml  18.72 ml/m LA Vol (A4C):   53.0 ml 24.80 ml/m LA Biplane Vol: 52.5 ml 24.56 ml/m  AORTIC VALVE LVOT Vmax:   71.80 cm/s LVOT Vmean:  43.100 cm/s LVOT VTI:    0.165 m  AORTA Ao Root diam: 3.00 cm  SHUNTS Systemic VTI:  0.16 m Systemic Diam: 2.30 cm Fransico Him MD Electronically signed by Fransico Him MD Signature Date/Time: 08/17/2019/10:21:51 AM    Final     Scheduled Meds: . amLODipine  5 mg Oral Q breakfast  . vitamin C  500 mg Oral Daily  . aspirin EC  81 mg Oral Daily  . atorvastatin  20 mg Oral q1800  . carvedilol  12.5 mg Oral BID WC  . dexamethasone (DECADRON) injection  6 mg Intravenous Q24H  . enoxaparin (LOVENOX) injection  60 mg Subcutaneous  Q24H  . famotidine  20 mg Oral QHS  . fluticasone  1 spray Each Nare Daily  . loratadine  10 mg Oral Daily  . multivitamin with minerals  1 tablet Oral Daily  . Oxcarbazepine  450 mg Oral BID  . venlafaxine XR  75 mg Oral Q breakfast  . zinc sulfate  220 mg Oral Daily   Continuous Infusions: . remdesivir 100 mg in NS 100 mL 100 mg (08/18/19 0920)     LOS: 2 days   Time spent: >11min  Velora Horstman C Athalene Kolle, DO [facility]  If 7PM-7AM, please contact night-coverage [email or web address]  08/18/2019, 3:33 PM

## 2019-08-18 NOTE — Progress Notes (Signed)
Occupational Therapy Evaluation Patient Details Name: Marie Ramos MRN: [id number] DOB: 1955-11-29 Today's Date: 08/18/2019    History of Present Illness 64 y.o. female with medical history significant of bipolar disorder, CVA, TBI, anoxic brain injury, HOCM, QT prolongation, history of V. Fib cardiac arrest status post AICD, hypertension, hyperlipidemia, history of DVT presenting to the ED for evaluation of altered mental status.   Clinical Impression   Patient is pleasant but cognition is still not at baseline.  Patient demonstrated poor short term memory, recalling 2/3 simple words. She required verbal cueing and needed many questions rephrased/repeated.  Patient lives at home (2 stories, not possible to live on lower level) with husband and is independent without devices at prior level.  First attempted functional mobility without RW as she stated she did not need it, and patient needed one hand held support from therapist and reached for surfaces with other hand.  Did much better with RW, she required min guard with functional mobility and standing ADLs.  Patient SpO2 resting 90 on 2L.  Desat to 83 with mobility, once bumped to 4L she was able to maintain SpO2 90.  Educated on flutter valve and incentive spirometer (pull 500).  Will continue to follow with OT acutely to address the deficits listed below to increase independence with ADLs.      Follow Up Recommendations  Home health OT    Equipment Recommendations  None recommended by OT    Recommendations for Other Services       Precautions / Restrictions Precautions Precautions: Fall Restrictions Weight Bearing Restrictions: No      Mobility Bed Mobility               General bed mobility comments: Up in chair  Transfers Overall transfer level: Needs assistance Equipment used: Rolling walker (2 wheeled) Transfers: Sit to/from Stand Sit to Stand: Supervision              Balance Overall balance  assessment: Needs assistance Sitting-balance support: No upper extremity supported;Feet supported Sitting balance-Leahy Scale: Good     Standing balance support: Single extremity supported;During functional activity Standing balance-Leahy Scale: Poor Standing balance comment: When without RW she required 1 hand held assist and used other hand to steady self on furniture/wall. Performed well with RW                           ADL either performed or assessed with clinical judgement   ADL Overall ADL's : Needs assistance/impaired Eating/Feeding: Independent;Sitting   Grooming: Min guard;Standing   Upper Body Bathing: Min guard;Sitting   Lower Body Bathing: Minimal assistance;Sit to/from stand   Upper Body Dressing : Set up;Sitting   Lower Body Dressing: Minimal assistance;Sit to/from stand   Toilet Transfer: Min guard;RW           Functional mobility during ADLs: Surveyor, minerals     Praxis      Pertinent Vitals/Pain Pain Assessment: No/denies pain     Hand Dominance Right   Extremity/Trunk Assessment Upper Extremity Assessment Upper Extremity Assessment: Generalized weakness           Communication Communication Communication: No difficulties   Cognition Arousal/Alertness: Awake/alert Behavior During Therapy: WFL for tasks assessed/performed Overall Cognitive Status: Impaired/Different from baseline Area of Impairment: Attention;Memory;Following commands;Safety/judgement;Problem solving  Current Attention Level: Selective Memory: Decreased short-term memory Following Commands: Follows multi-step commands inconsistently;Follows multi-step commands with increased time Safety/Judgement: Decreased awareness of safety;Decreased awareness of deficits   Problem Solving: Slow processing;Decreased initiation;Requires verbal cues;Difficulty sequencing General Comments: "Foggy", she  is aware and stated her memory is worse than normal. Not much insight into poor balance/activity tolerance   General Comments       Exercises Exercises: Other exercises Other Exercises Other Exercises: x10 flutter valve Other Exercises: x10 incentive spirometer Other Exercises: pursed lip breathin   Shoulder Instructions      Home Living Family/patient expects to be discharged to:: Private residence Living Arrangements: Spouse/significant other Available Help at Discharge: Family Type of Home: House Home Access: Stairs to enter     Home Layout: Two level;Bed/bath upstairs Alternate Level Stairs-Number of Steps: 11   Bathroom Shower/Tub: Technical sales engineer: Yes   Home Equipment: Clinical cytogeneticist - 2 wheels          Prior Functioning/Environment Level of Independence: Independent                 OT Problem List: Decreased strength;Decreased activity tolerance;Impaired balance (sitting and/or standing);Decreased cognition;Decreased safety awareness;Cardiopulmonary status limiting activity      OT Treatment/Interventions: Self-care/ADL training;Therapeutic exercise;Energy conservation;Therapeutic activities;Balance training;Patient/family education;Cognitive remediation/compensation    OT Goals(Current goals can be found in the care plan section) Acute Rehab OT Goals Patient Stated Goal: Home to husband OT Goal Formulation: With patient Time For Goal Achievement: 09/01/19 Potential to Achieve Goals: Good  OT Frequency: Min 3X/week   Barriers to D/C:            Co-evaluation              AM-PAC OT "6 Clicks" Daily Activity     Outcome Measure Help from another person eating meals?: None Help from another person taking care of personal grooming?: A Little Help from another person toileting, which includes using toliet, bedpan, or urinal?: A Little Help from another person bathing (including washing, rinsing, drying)?: A  Little Help from another person to put on and taking off regular upper body clothing?: A Little Help from another person to put on and taking off regular lower body clothing?: A Little 6 Click Score: 19   End of Session Equipment Utilized During Treatment: Oxygen;Rolling walker;Gait belt Nurse Communication: Mobility status  Activity Tolerance: Patient tolerated treatment well Patient left: in chair;with call bell/phone within reach;with chair alarm set  OT Visit Diagnosis: Unsteadiness on feet (R26.81);Muscle weakness (generalized) (M62.81);Other symptoms and signs involving cognitive function                Time: 1110-1142 OT Time Calculation (min): 32 min Charges:  OT General Charges $OT Visit: 1 Visit OT Evaluation $OT Eval Moderate Complexity: 1 Mod OT Treatments $Self Care/Home Management : 8-22 mins  August Luz, OTR/L   Marie Ramos 08/18/2019, 2:51 PM

## 2019-08-18 NOTE — Progress Notes (Signed)
Patient waledin room for about 5 mins with 2 L of O2.  Sats went to 87%. Quickly recovered after sitting down

## 2019-08-18 NOTE — Telephone Encounter (Signed)
reviewed

## 2019-08-19 LAB — COMPREHENSIVE METABOLIC PANEL
ALT: 24 U/L (ref 0–44)
AST: 21 U/L (ref 15–41)
Albumin: 2.9 g/dL — ABNORMAL LOW (ref 3.5–5.0)
Alkaline Phosphatase: 103 U/L (ref 38–126)
Anion gap: 9 (ref 5–15)
BUN: 41 mg/dL — ABNORMAL HIGH (ref 8–23)
CO2: 32 mmol/L (ref 22–32)
Calcium: 9.2 mg/dL (ref 8.9–10.3)
Chloride: 98 mmol/L (ref 98–111)
Creatinine, Ser: 1.2 mg/dL — ABNORMAL HIGH (ref 0.44–1.00)
GFR calc Af Amer: 56 mL/min — ABNORMAL LOW (ref >60)
GFR calc non Af Amer: 48 mL/min — ABNORMAL LOW (ref >60)
Glucose, Bld: 100 mg/dL — ABNORMAL HIGH (ref 70–99)
Potassium: 4.4 mmol/L (ref 3.5–5.1)
Sodium: 139 mmol/L (ref 135–145)
Total Bilirubin: 0.8 mg/dL (ref 0.3–1.2)
Total Protein: 6.6 g/dL (ref 6.5–8.1)

## 2019-08-19 LAB — CBC WITH DIFFERENTIAL/PLATELET
Abs Immature Granulocytes: 0.04 10*3/uL (ref 0.00–0.07)
Basophils Absolute: 0 10*3/uL (ref 0.0–0.1)
Basophils Relative: 0 %
Eosinophils Absolute: 0 10*3/uL (ref 0.0–0.5)
Eosinophils Relative: 0 %
HCT: 38.4 % (ref 36.0–46.0)
Hemoglobin: 11.6 g/dL — ABNORMAL LOW (ref 12.0–15.0)
Immature Granulocytes: 0 %
Lymphocytes Relative: 11 %
Lymphs Abs: 1.2 10*3/uL (ref 0.7–4.0)
MCH: 28.6 pg (ref 26.0–34.0)
MCHC: 30.2 g/dL (ref 30.0–36.0)
MCV: 94.8 fL (ref 80.0–100.0)
Monocytes Absolute: 1.1 10*3/uL — ABNORMAL HIGH (ref 0.1–1.0)
Monocytes Relative: 11 %
Neutro Abs: 8.1 10*3/uL — ABNORMAL HIGH (ref 1.7–7.7)
Neutrophils Relative %: 78 %
Platelets: 615 10*3/uL — ABNORMAL HIGH (ref 150–400)
RBC: 4.05 MIL/uL (ref 3.87–5.11)
RDW: 14.6 % (ref 11.5–15.5)
WBC: 10.4 10*3/uL (ref 4.0–10.5)
nRBC: 0.7 % — ABNORMAL HIGH (ref 0.0–0.2)

## 2019-08-19 LAB — C-REACTIVE PROTEIN: CRP: 1 mg/dL — ABNORMAL HIGH (ref <1.0)

## 2019-08-19 LAB — LACTATE DEHYDROGENASE: LDH: 240 U/L — ABNORMAL HIGH (ref 98–192)

## 2019-08-19 LAB — D-DIMER, QUANTITATIVE: D-Dimer, Quant: 6.35 ug/mL-FEU — ABNORMAL HIGH (ref 0.00–0.50)

## 2019-08-19 MED ORDER — ENOXAPARIN SODIUM 60 MG/0.6ML ~~LOC~~ SOLN
60.0000 mg | Freq: Two times a day (BID) | SUBCUTANEOUS | Status: DC
  Administered 2019-08-19 – 2019-08-22 (×6): 60 mg via SUBCUTANEOUS
  Filled 2019-08-19 (×6): qty 0.6

## 2019-08-19 NOTE — Progress Notes (Signed)
PROGRESS NOTE    MA OURSLER  [id number] DOB: [birth year] DOA: 08/16/2019 PCP: Lawerance Cruel, MD   HPI: Marie Ramos is a 64 y.o. female with medical history significant of bipolar disorder, CVA, TBI, anoxic brain injury, HOCM, QT prolongation, history of V. Fib cardiac arrest status post AICD, hypertension, hyperlipidemia, history of DVT presenting to the ED for evaluation of altered mental status.  History limited as patient appears confused.  States she has bipolar disorder but her husband is concerned that for the past 2 days she has not been acting right.  Reports having shortness of breath but is not sure when it started.  No other complaints.  Denies fevers, chills, chest pain, nausea, vomiting, abdominal pain, or diarrhea.  No additional history could be obtained from the patient. In ED: Oxygen saturation 82% on room air, improved with 3 L supplemental oxygen. Chest x-ray showing patchy opacities in the periphery of the right mid upper lung suspicious for pneumonia/atypical viral infection.  Also showing cardiomegaly with vascular congestion. Patient received Tylenol, Decadron, and remdesivir.  Subjective: No acute issues or events overnight, patient indicates her respiratory status is improving but not yet back to baseline, remains molderately dyspneic with exertion, declines chest pain, nausea, vomiting, diarrhea, constipation, headache, fevers, chills.  More awake alert oriented this morning than previous.  Assessment & Plan:   Principal Problem:   Pneumonia due to COVID-19 virus Active Problems:   Acute respiratory failure (HCC)   Essential hypertension   Pulmonary vascular congestion   Acute metabolic encephalopathy   Assessment/Plan  Acute hypoxic respiratory failure in the setting of COVID-19 pneumonia, POA SpO2: 90 % O2 Flow Rate (L/min): 3 L/min FiO2 (%): (!) 3 % Continue to wean oxygen as tolerated: goal sats 88-92% Inflammatory labs as  below Continue remdesivir, Decadron per protocol - stop date 2/13 and 2/17 respectively Albuterol as necessary Continue to prone incentive spirometry and early ambulation as tolerated Continue vitamin C, zinc, antitussives, supportive care Recent Labs    08/16/19 1654 08/16/19 1654 08/17/19 0500 08/18/19 0315 08/19/19 0220  DDIMER 1.97*   < > 4.67* 6.03* 6.35*  FERRITIN 19  --   --   --   --   LDH 272*   < > 222* 239* 240*  CRP 3.7*   < > 2.6* 1.3* 1.0*   < > = values in this interval not displayed.    Pulmonary vascular congestion, history of grade 1 diastolic dysfunction, questionably an exacerbation, resolving -Chest x-ray showing cardiomegaly with vascular congestion.  BNP slightly elevated.   -Last echo done January 2018 with LVEF 55 to [id number], grade 1 diastolic dysfunction, and mild LV hypertrophy. -Echo as below poor study - unable to assess diastolic function; EF [id number] -Cardiac monitoring -IV Lasix 20 mg x1 at admission - appears more euvolemic today -Monitor intake and output, daily weights, low-sodium diet with fluid restriction  Acute metabolic encephalopathy, likely secondary to above, resolved -Related to COVID-19 infection -Without focal neuro deficit appreciated on exam -Resolving with treatment of viral infection as above - more awake alert oriented this morning -TSH, B12 within normal limits  Chronic thrombocytosis -Platelet count stable in the 600s, chronically elevated (baseline 500).  -Follow up outpatient for further workup  Hyperlipidemia -Continue home Lipitor  Hypertension -More well controlled today -Continue home amlodipine, Coreg -Continue hydralazine PRN  Bipolar disorder -Continue home Trileptal, Effexor  DVT prophylaxis: Lovenox Code Status: Full code Family Communication: Patient to update Disposition Plan: Anticipate discharge home  after clinical improvement/completion of therapy Consults called: None Admission status patient  continues as inpatient status, continues to require supplemental oxygen well above baseline, IV antivirals and treatment, remains high risk for decompensation given age, comorbid conditions and acute illness.  Objective: Vitals:   08/19/19 1040 08/19/19 1058 08/19/19 1101 08/19/19 1118  BP:      Pulse: 69  71   Resp:      Temp:      TempSrc:      SpO2: 93% (!) 87% (!) 84% 90%  Weight:      Height:        Intake/Output Summary (Last 24 hours) at 08/19/2019 1148 Last data filed at 08/19/2019 0600 Gross per 24 hour  Intake 600 ml  Output 1050 ml  Net -450 ml   Filed Weights   08/16/19 1513  Weight: 117.9 kg    Examination:  General exam: Appears calm and comfortable, awake alert oriented x3 Respiratory system: Clear to auscultation. Respiratory effort normal. Cardiovascular system: S1 & S2 heard, RRR. No JVD, murmurs, rubs, gallops or clicks. No pedal edema. Gastrointestinal system: Abdomen is nondistended, soft and nontender. No organomegaly or masses felt. Normal bowel sounds heard. Central nervous system: Alert and oriented. No focal neurological deficits. Extremities: Symmetric 5 x 5 power. Skin: No rashes, lesions or ulcers   Data Reviewed: I have personally reviewed following labs and imaging studies  CBC: Recent Labs  Lab 08/16/19 1654 08/17/19 0500 08/18/19 0315 08/19/19 0220  WBC 8.2 5.7 8.6 10.4  NEUTROABS 6.2 5.1 6.4 8.1*  HGB 13.4 13.1 11.7* 11.6*  HCT 43.5 43.6 38.9 38.4  MCV 94.2 96.0 95.6 94.8  PLT 708* 619* 627* [id number]*   Basic Metabolic Panel: Recent Labs  Lab 08/16/19 1654 08/17/19 0500 08/18/19 0315 08/19/19 0220  NA 144 145 140 139  K 4.1 4.2 3.9 4.4  CL 97* 101 98 98  CO2 33* 33* 32 32  GLUCOSE 84 128* 96 100*  BUN 23 23 37* 41*  CREATININE 1.19* 1.18* 1.27* 1.20*  CALCIUM 9.5 8.8* 9.0 9.2   GFR: Estimated Creatinine Clearance: 58.5 mL/min (A) (by C-G formula based on SCr of 1.2 mg/dL (H)). Liver Function Tests: Recent Labs  Lab  08/16/19 1654 08/17/19 0500 08/18/19 0315 08/19/19 0220  AST 27 22 19 21   ALT 26 22 22 24   ALKPHOS 125 123 108 103  BILITOT 0.5 0.4 0.5 0.8  PROT 7.7 7.4 6.8 6.6  ALBUMIN 3.4* 3.3* 3.0* 2.9*   No results for input(s): LIPASE, AMYLASE in the last 168 hours. Recent Labs  Lab 08/16/19 2009  AMMONIA 25   CBG: Recent Labs  Lab 08/18/19 2004  GLUCAP 143*   Lipid Profile: Recent Labs    08/16/19 1654  TRIG 128   Thyroid Function Tests: Recent Labs    08/16/19 2009  TSH 1.218   Anemia Panel: Recent Labs    08/16/19 1654 08/16/19 2009  VITAMINB12  --  1,161*  FERRITIN 19  --    Sepsis Labs: Recent Labs  Lab 08/16/19 1654 08/16/19 1820  PROCALCITON <0.10  --   LATICACIDVEN 1.0 1.7    Recent Results (from the past 240 hour(s))  Respiratory Panel by RT PCR (Flu A&B, Covid) - Nasopharyngeal Swab     Status: Abnormal   Collection Time: 08/16/19  4:21 PM   Specimen: Nasopharyngeal Swab  Result Value Ref Range Status   SARS Coronavirus 2 by RT PCR POSITIVE (A) NEGATIVE Final    Comment: RESULT  CALLED TO, READ BACK BY AND VERIFIED WITH: OXENDINE,J. RN @1917  ON 02.09.2021 BY COHEN,K (NOTE) SARS-CoV-2 target nucleic acids are DETECTED. SARS-CoV-2 RNA is generally detectable in upper respiratory specimens  during the acute phase of infection. Positive results are indicative of the presence of the identified virus, but do not rule out bacterial infection or co-infection with other pathogens not detected by the test. Clinical correlation with patient history and other diagnostic information is necessary to determine patient infection status. The expected result is Negative. Fact Sheet for Patients:  PinkCheek.be Fact Sheet for Healthcare Providers: GravelBags.it This test is not yet approved or cleared by the [place] FDA and  has been authorized for detection and/or diagnosis of SARS-CoV-2 by FDA  under an Emergency Use Authorization (EUA).  This EUA will remain in effect (meaning this test can  be used) for the duration of  the COVID-19 declaration under Section 564(b)(1) of the Act, 21 U.S.C. section 360bbb-3(b)(1), unless the authorization is terminated or revoked sooner.    Influenza A by PCR NEGATIVE NEGATIVE Final   Influenza B by PCR NEGATIVE NEGATIVE Final    Comment: (NOTE) The Xpert Xpress SARS-CoV-2/FLU/RSV assay is intended as an aid in  the diagnosis of influenza from Nasopharyngeal swab specimens and  should not be used as a sole basis for treatment. Nasal washings and  aspirates are unacceptable for Xpert Xpress SARS-CoV-2/FLU/RSV  testing. Fact Sheet for Patients: PinkCheek.be Fact Sheet for Healthcare Providers: GravelBags.it This test is not yet approved or cleared by the [place] FDA and  has been authorized for detection and/or diagnosis of SARS-CoV-2 by  FDA under an Emergency Use Authorization (EUA). This EUA will remain  in effect (meaning this test can be used) for the duration of the  Covid-19 declaration under Section 564(b)(1) of the Act, 21  U.S.C. section 360bbb-3(b)(1), unless the authorization is  terminated or revoked. Performed at [facility], [place] [address] [place], [place] [address]   Blood Culture (routine x 2)     Status: None (Preliminary result)   Collection Time: 08/16/19  4:50 PM   Specimen: BLOOD  Result Value Ref Range Status   Specimen Description   Final    BLOOD RIGHT ANTECUBITAL Performed at [place] [address] [place], [place] [address]    Special Requests   Final    BOTTLES DRAWN AEROBIC AND ANAEROBIC Blood Culture adequate volume Performed at [place] [address] [place], [place] [address]    Culture   Final    NO GROWTH 3 DAYS Performed at [facility], [place] [address]  [place], [place] [address]    Report Status PENDING  Incomplete  Blood Culture (routine x 2)     Status: None (Preliminary result)   Collection Time: 08/16/19  4:54 PM   Specimen: BLOOD  Result Value Ref Range Status   Specimen Description   Final    BLOOD LEFT WRIST Performed at [place] [address], [place], [place] [address]    Special Requests   Final    BOTTLES DRAWN AEROBIC AND ANAEROBIC Blood Culture adequate volume Performed at [place] [address] [place], [place] [address]    Culture   Final    NO GROWTH 3 DAYS Performed at [facility], [place] [address] [place],  [address]    Report Status PENDING  Incomplete     Radiology Studies: No results  found.  Scheduled Meds: . amLODipine  5 mg Oral Q breakfast  . vitamin C  500 mg Oral Daily  . aspirin EC  81 mg Oral Daily  . atorvastatin  20 mg Oral q1800  . carvedilol  12.5 mg Oral BID WC  . dexamethasone (DECADRON) injection  6 mg Intravenous Q24H  . enoxaparin (LOVENOX) injection  60 mg Subcutaneous Q24H  . famotidine  20 mg Oral QHS  . fluticasone  1 spray Each Nare Daily  . loratadine  10 mg Oral Daily  . multivitamin with minerals  1 tablet Oral Daily  . Oxcarbazepine  450 mg Oral BID  . venlafaxine XR  75 mg Oral Q breakfast  . zinc sulfate  220 mg Oral Daily   Continuous Infusions: . remdesivir 100 mg in NS 100 mL 100 mg (08/19/19 1001)     LOS: 3 days   Time spent: >53min  Sherrina Zaugg C Eriel Dunckel, DO [facility]  If 7PM-7AM, please contact night-coverage [email or web address]  08/19/2019, 11:48 AM

## 2019-08-19 NOTE — Evaluation (Addendum)
Physical Therapy Evaluation Patient Details Name: Marie Ramos MRN: [id number] DOB: 1956-06-09 Today's Date: 08/19/2019   History of Present Illness  64 year old female who presented to the ED for evaluation of altered mental status. Patient also noting SOB but unsure how many days she has been experiencing this. Oxygen saturation 82% on room air, improved with 3 L supplemental oxygen. CXR: patchy opacities in the periphery of the right mid upper lung suspicious for pneumonia/atypical viral infection as well as cardiomegaly with vascular congestion. +COVID. Patient received Tylenol, Decadron, and Remdesivir. PMH: bipolar disorder, CVA, TBI, anxoic brain injury, HOCM, QT prolongation, Vfib cardiac arrest s/p AICD, HTN, HLD, history DVT.   Clinical Impression   Patient presents with decreased activity tolerance, desats on room air at rest and requires suppl oxygen via [place] at rest and with mobility, currently 2L at rest and 3L with mobility. Patient unsteady in standing without UE support. Improved mobility with use of RW. Patient has a RW at home she can use if needed. Patient ambulates with decreased speed and decreased step length with and without RW. She tends to hold her breath and needs cues for PLB. If patient is to discharge from the hospital in the next 24-48 hours, would recommend short term rehab at [facility]. If patient is to discharge from the hospital on Monday (current expected discharge date per patient), she may be able to progress her mobility and wean off of suppl oxygen in order to safely discharge home with home PT. Barriers to discharge home at this time are need for assistance with mobility, need for suppl oxygen and assistance with oxygen tubing during mobility, stairs in the home, and patient would be home alone at times as her husband works part time.    08/19/19 1040  Vitals  Pulse Rate 69  Oxygen Therapy  SpO2 93 %  O2 Device Nasal Cannula  O2 Flow Rate (L/min) 2 L/min   Patient Activity (if Appropriate) In chair  Pulse Oximetry Type Continuous     08/19/19 1058  Oxygen Therapy  SpO2 (!) 87 %  O2 Device Nasal Cannula  O2 Flow Rate (L/min) 2 L/min  Patient Activity (if Appropriate) Ambulating (when patient holds breath on 2 or 3L, O2 sat will dec to 86%)  Pulse Oximetry Type Continuous     08/19/19 1101  Vitals  Pulse Rate 71  Oxygen Therapy  SpO2 (!) 84 % (sitting x approx 2 minutes on room air)  O2 Device Room Air  Patient Activity (if Appropriate) In chair  Pulse Oximetry Type Continuous     08/19/19 1118  Oxygen Therapy  SpO2 90 %  O2 Device Nasal Cannula  O2 Flow Rate (L/min) 3 L/min  Patient Activity (if Appropriate) Ambulating  Pulse Oximetry Type Continuous      Follow Up Recommendations Supervision/Assistance - 24 hour;Home health PT;SNF(pending mobility progression in hospital setting)    Equipment Recommendations  Rolling walker with 5" wheels((pt owns RW))       Precautions / Restrictions Precautions Precautions: Fall;Other (comment)(monitor oxygen saturation ) Precaution Comments: patient tends to hold her breath Restrictions Weight Bearing Restrictions: No      Mobility  Bed Mobility     General bed mobility comments: (Patient up in chair upon PT arrival, no linens on bed )  Transfers Overall transfer level: Needs assistance   Transfers: Sit to/from Stand Sit to Stand: Min guard;Supervision         General transfer comment: sit<>stand from recliner chair without  AD and contact guard, sit<>stand from recliner chair with RW and supervision  Ambulation/Gait Ambulation/Gait assistance: Min guard;Min assist Gait Distance (Feet): 30 Feet(50) Assistive device: None;Rolling walker (2 wheeled) Gait Pattern/deviations: Step-through pattern;Decreased step length - right;Decreased step length - left(decreased speed) Gait velocity: decreased   General Gait Details: Unsteady requiring minA without AD trial 1  for 41ft in room on 2L suppl O2. Contact guard with RW trial 2 on 3L suppl O2.   Stairs Not tested this session Need to assess prior to discharge      Balance Overall balance assessment: Needs assistance         Standing balance support: No upper extremity supported Standing balance-Leahy Scale: Fair Standing balance comment: Improved mobility with use of RW. Requires assistance for balance without use of RW.        Pertinent Vitals/Pain Pain Assessment: 0-10 Pain Score: 5  Pain Location: R forearm where IV is Pain Descriptors / Indicators: Discomfort Pain Intervention(s): (will inform RN via secure chat)    Home Living Family/patient expects to be discharged to:: Private residence Living Arrangements: Spouse/significant other((husband works part time outside of the home)) Available Help at Discharge: Family Type of Home: House Home Access: (threshold to enter)   Technical brewer of Steps: threshold Home Layout: Two level;1/2 bath on main level Home Equipment: Clinical cytogeneticist - 2 wheels(unsure if she has BSC) Additional Comments: Independent PTA, no AD, modI with stairs using railing. Husband does laundry as it is located upstairs on 2nd floor. Husband works part time outside of the home. Patient is home alone at times. Patient's husband had COVID and is already recovered.    Prior Function Level of Independence: Independent without AD, husband does laundry   Comments: fall at dtr's house Nov 19, 2018 resulting in wrist injury and surgery        Extremity/Trunk Assessment    Lower Extremity Assessment Lower Extremity Assessment: LLE deficits/detail;RLE deficits/detail RLE Deficits / Details: RLE strength at ankle and knee 4/5, hip flexion 3-/5 RLE Sensation: (pt denies numbness or tingling) LLE Deficits / Details: LLE strength at ankle and knee 4/5, hip flexion 3-/5 LLE Sensation: (pt denies numbness or tingling)            Cognition  Arousal/Alertness: Awake/alert Behavior During Therapy: WFL for tasks assessed/performed         General Comments: oriented to person, place, time, situation         Exercises Other Exercises Other Exercises: flutter valve x 10, incentive spirometer x 10 (high 625)   Assessment/Plan    PT Assessment Patient needs continued PT services  PT Problem List Decreased strength;Decreased activity tolerance;Decreased balance;Decreased mobility;Cardiopulmonary status limiting activity       PT Treatment Interventions DME instruction;Gait training;Stair training;Functional mobility training;Therapeutic activities;Therapeutic exercise;Balance training;Patient/family education    PT Goals (Current goals can be found in the Care Plan section)  Acute Rehab PT Goals Potential to Achieve Goals: Good    Frequency Min 3X/week   Barriers to discharge Inaccessible home environment;Decreased caregiver support Patient notes that bedroom is on 2nd floor of the home. Half bath only first floor. Husband works part time outside of the home so patient is home alone at times.         AM-PAC PT "6 Clicks" Mobility  Outcome Measure Help needed turning from your back to your side while in a flat bed without using bedrails?: A Little Help needed moving from lying on your back to sitting on the side  of a flat bed without using bedrails?: A Little Help needed moving to and from a bed to a chair (including a wheelchair)?: A Little Help needed standing up from a chair using your arms (e.g., wheelchair or bedside chair)?: A Little Help needed to walk in hospital room?: A Little Help needed climbing 3-5 steps with a railing? : A Lot 6 Click Score: 17    End of Session Equipment Utilized During Treatment: Oxygen Activity Tolerance: Patient tolerated treatment well Patient left: in chair Nurse Communication: Mobility status(oxygen, IV discomfort, via secure chat in [place]) PT Visit Diagnosis: Unsteadiness on  feet (R26.81);Other abnormalities of gait and mobility (R26.89);Difficulty in walking, not elsewhere classified (R26.2)    Time: [id number] PT Time Calculation (min) (ACUTE ONLY): 45 min   Charges:   PT Evaluation $PT Eval Moderate Complexity: 1 Mod PT Treatments $Gait Training: 8-22 mins       Birdie Hopes, DPT, PT Acute Rehab [phone]    Birdie Hopes 08/19/2019, 2:20 PM

## 2019-08-19 NOTE — Plan of Care (Signed)
  Problem: Education: Goal: Knowledge of risk factors and measures for prevention of condition will improve Outcome: Progressing   Problem: Coping: Goal: Psychosocial and spiritual needs will be supported Outcome: Progressing   Problem: Respiratory: Goal: Will maintain a patent airway Outcome: Progressing Goal: Complications related to the disease process, condition or treatment will be avoided or minimized Outcome: Progressing   

## 2019-08-19 NOTE — Progress Notes (Addendum)
SATURATION QUALIFICATIONS: (This note is used to comply with regulatory documentation for home oxygen)  Patient Saturations on Room Air at Rest = 83%  Patient Saturations on Room Air while Ambulating = not tested as patient 83% at rest on room air  Patient Saturations on 2 Liters of oxygen while Ambulating = 87% Patient Saturations on Liters of oxygen while Ambulating = grossly 90% or better When patient holds her breath during ambulation, despite level of Liters of oxygen, O2 saturation will decrease to 86%  Please briefly explain why patient needs home oxygen: Currently patient requires suppl oxygen at rest and during mobility.

## 2019-08-20 LAB — LACTATE DEHYDROGENASE: LDH: 246 U/L — ABNORMAL HIGH (ref 98–192)

## 2019-08-20 LAB — COMPREHENSIVE METABOLIC PANEL
ALT: 43 U/L (ref 0–44)
AST: 34 U/L (ref 15–41)
Albumin: 3 g/dL — ABNORMAL LOW (ref 3.5–5.0)
Alkaline Phosphatase: 98 U/L (ref 38–126)
Anion gap: 12 (ref 5–15)
BUN: 43 mg/dL — ABNORMAL HIGH (ref 8–23)
CO2: 30 mmol/L (ref 22–32)
Calcium: 9.7 mg/dL (ref 8.9–10.3)
Chloride: 99 mmol/L (ref 98–111)
Creatinine, Ser: 0.8 mg/dL (ref 0.44–1.00)
GFR calc Af Amer: >60 mL/min (ref >60)
GFR calc non Af Amer: >60 mL/min (ref >60)
Glucose, Bld: 92 mg/dL (ref 70–99)
Potassium: 4.7 mmol/L (ref 3.5–5.1)
Sodium: 141 mmol/L (ref 135–145)
Total Bilirubin: 0.2 mg/dL — ABNORMAL LOW (ref 0.3–1.2)
Total Protein: 6.4 g/dL — ABNORMAL LOW (ref 6.5–8.1)

## 2019-08-20 LAB — CBC WITH DIFFERENTIAL/PLATELET
Abs Immature Granulocytes: 0.05 10*3/uL (ref 0.00–0.07)
Basophils Absolute: 0 10*3/uL (ref 0.0–0.1)
Basophils Relative: 0 %
Eosinophils Absolute: 0 10*3/uL (ref 0.0–0.5)
Eosinophils Relative: 0 %
HCT: 38.6 % (ref 36.0–46.0)
Hemoglobin: 11.6 g/dL — ABNORMAL LOW (ref 12.0–15.0)
Immature Granulocytes: 1 %
Lymphocytes Relative: 10 %
Lymphs Abs: 1 10*3/uL (ref 0.7–4.0)
MCH: 28.6 pg (ref 26.0–34.0)
MCHC: 30.1 g/dL (ref 30.0–36.0)
MCV: 95.3 fL (ref 80.0–100.0)
Monocytes Absolute: 1 10*3/uL (ref 0.1–1.0)
Monocytes Relative: 11 %
Neutro Abs: 7.7 10*3/uL (ref 1.7–7.7)
Neutrophils Relative %: 78 %
Platelets: 625 10*3/uL — ABNORMAL HIGH (ref 150–400)
RBC: 4.05 MIL/uL (ref 3.87–5.11)
RDW: 14.6 % (ref 11.5–15.5)
WBC: 9.8 10*3/uL (ref 4.0–10.5)
nRBC: 0.2 % (ref 0.0–0.2)

## 2019-08-20 LAB — D-DIMER, QUANTITATIVE: D-Dimer, Quant: 3.48 ug/mL-FEU — ABNORMAL HIGH (ref 0.00–0.50)

## 2019-08-20 LAB — C-REACTIVE PROTEIN: CRP: 1.6 mg/dL — ABNORMAL HIGH (ref <1.0)

## 2019-08-20 NOTE — Progress Notes (Signed)
Patient up to chair today and worked with pt/ ot today an walked the halls. Eating majority of meals. Patient received last dose of Remdesivir.Goal to discharge with home health on Monday.

## 2019-08-20 NOTE — Progress Notes (Signed)
Physical Therapy Treatment Patient Details Name: Marie Ramos MRN: [id number] DOB: May 12, 1956 Today's Date: 08/20/2019    History of Present Illness 64 year old female who presented to the ED for evaluation of altered mental status. Patient also noting SOB but unsure how many days she has been experiencing this. Oxygen saturation 82% on room air, improved with 3 L supplemental oxygen. CXR: patchy opacities in the periphery of the right mid upper lung suspicious for pneumonia/atypical viral infection as well as cardiomegaly with vascular congestion. +COVID. Patient received Tylenol, Decadron, and Remdesivir. PMH: bipolar disorder, CVA, TBI, anxoic brain injury, HOCM, QT prolongation, Vfib cardiac arrest s/p AICD, HTN, HLD, history DVT.     PT Comments    Pt did well in tx today, was able to ascend/descend 1 8 inch step 2 x5 with uni rail, om 0.5L /min via [place] desat to 89%. Ambulated 81ft with RW and SBA in room on 1L/min desat to 87%. Pt has been reinforced on incentive spirometer use and also flutter valve.     Follow Up Recommendations  Supervision/Assistance - 24 hour;Home health PT;SNF     Equipment Recommendations  Rolling walker with 5" wheels    Recommendations for Other Services       Precautions / Restrictions Precautions Precautions: Fall Restrictions Weight Bearing Restrictions: No    Mobility  Bed Mobility               General bed mobility comments: Up in chair  Transfers Overall transfer level: Needs assistance Equipment used: Rolling walker (2 wheeled) Transfers: Sit to/from Stand Sit to Stand: Supervision            Ambulation/Gait Ambulation/Gait assistance: Min guard Gait Distance (Feet): 48 Feet Assistive device: Rolling walker (2 wheeled) Gait Pattern/deviations: Step-through pattern;Decreased step length - right;Decreased step length - left Gait velocity: decreased       Stairs Stairs: Yes Stairs assistance: Min assist Stair  Management: One rail Left Number of Stairs: 5 General stair comments: ascend/descend 1 8 inch step 2 x 5 reps w/ min a and uni UE support, on 0.5L/min  and desat to min 89%   Wheelchair Mobility    Modified Rankin (Stroke Patients Only)       Balance Overall balance assessment: Needs assistance Sitting-balance support: No upper extremity supported;Feet supported Sitting balance-Leahy Scale: Good     Standing balance support: Bilateral upper extremity supported Standing balance-Leahy Scale: Fair                              Cognition Arousal/Alertness: Awake/alert Behavior During Therapy: WFL for tasks assessed/performed Overall Cognitive Status: Impaired/Different from baseline Area of Impairment: Attention;Memory;Following commands;Safety/judgement;Problem solving                   Current Attention Level: Selective Memory: Decreased short-term memory Following Commands: Follows multi-step commands inconsistently;Follows multi-step commands with increased time Safety/Judgement: Decreased awareness of safety;Decreased awareness of deficits   Problem Solving: Slow processing;Decreased initiation;Requires verbal cues;Difficulty sequencing General Comments: Cognitive level has improved some since last visit      Exercises Other Exercises Other Exercises: x10 fultter valve Other Exercises: x10 incentive spirometer Other Exercises: pursed lip breathin    General Comments        Pertinent Vitals/Pain Pain Assessment: Faces Faces Pain Scale: Hurts a little bit Pain Location: R forearm where IV is Pain Descriptors / Indicators: Discomfort Pain Intervention(s): Limited activity within patient's tolerance;Monitored during  session    Home Living                      Prior Function            PT Goals (current goals can now be found in the care plan section) Acute Rehab PT Goals Patient Stated Goal: Home to husband Potential to Achieve  Goals: Good Progress towards PT goals: Progressing toward goals    Frequency    Min 3X/week      PT Plan Current plan remains appropriate    Co-evaluation              AM-PAC PT "6 Clicks" Mobility   Outcome Measure  Help needed turning from your back to your side while in a flat bed without using bedrails?: A Little Help needed moving from lying on your back to sitting on the side of a flat bed without using bedrails?: A Little Help needed moving to and from a bed to a chair (including a wheelchair)?: A Little Help needed standing up from a chair using your arms (e.g., wheelchair or bedside chair)?: A Little Help needed to walk in hospital room?: A Little Help needed climbing 3-5 steps with a railing? : A Lot 6 Click Score: 17    End of Session Equipment Utilized During Treatment: Oxygen;Gait belt Activity Tolerance: Patient limited by fatigue;Patient limited by lethargy Patient left: in chair;with call bell/phone within reach Nurse Communication: Mobility status PT Visit Diagnosis: Unsteadiness on feet (R26.81);Other abnormalities of gait and mobility (R26.89);Difficulty in walking, not elsewhere classified (R26.2)     Time: [id number] PT Time Calculation (min) (ACUTE ONLY): 26 min  Charges:  $Gait Training: 8-22 mins $Therapeutic Activity: 8-22 mins                     Horald Chestnut, PT    Delford Field 08/20/2019, 4:48 PM

## 2019-08-20 NOTE — Progress Notes (Signed)
SATURATION QUALIFICATIONS: (This note is used to comply with regulatory documentation for home oxygen)  Patient Saturations on Room Air at Rest = 91%  Patient Saturations on Room Air while Ambulating = 81%  Patient Saturations on 2 Liters of oxygen while Ambulating = 92%  Please briefly explain why patient needs home oxygen: Patient SpO2 dropping during mobility and causing slight dizziness.  Dizziness subsides once on 2L.  August Luz, OTR/L

## 2019-08-20 NOTE — Progress Notes (Addendum)
PROGRESS NOTE    Marie Ramos  [id number] DOB: 06-11-1956 DOA: 08/16/2019 PCP: Lawerance Cruel, MD   HPI: Marie Ramos is a 64 y.o. female with medical history significant of bipolar disorder, CVA, TBI, anoxic brain injury, HOCM, QT prolongation, history of V. Fib cardiac arrest status post AICD, hypertension, hyperlipidemia, history of DVT presenting to the ED for evaluation of altered mental status.  History limited as patient appears confused.  States she has bipolar disorder but her husband is concerned that for the past 2 days she has not been acting right.  Reports having shortness of breath but is not sure when it started.  No other complaints.  Denies fevers, chills, chest pain, nausea, vomiting, abdominal pain, or diarrhea.  No additional history could be obtained from the patient. In ED: Oxygen saturation 82% on room air, improved with 3 L supplemental oxygen. Chest x-ray showing patchy opacities in the periphery of the right mid upper lung suspicious for pneumonia/atypical viral infection.  Also showing cardiomegaly with vascular congestion. Patient received Tylenol, Decadron, and remdesivir.  Subjective: No acute issues or events overnight, patient indicates her respiratory status is improving but not yet back to baseline, remains molderately dyspneic with exertion, declines chest pain, nausea, vomiting, diarrhea, constipation, headache, fevers, chills.    Assessment & Plan:   Principal Problem:   Pneumonia due to COVID-19 virus Active Problems:   Acute respiratory failure (HCC)   Essential hypertension   Pulmonary vascular congestion   Acute metabolic encephalopathy   Assessment/Plan  Acute hypoxic respiratory failure in the setting of COVID-19 pneumonia, POA SpO2: 91 % O2 Flow Rate (L/min): 1 L/min FiO2 (%): (!) 3 % Continue to wean oxygen as tolerated: goal sats 88-92% Ambulatory oxygen screen - 83% on RA while ambulating/91% on 2L [place] Remdesivir  completed Decadron per protocol - stop date 2/17 Albuterol as necessary Continue to prone incentive spirometry and early ambulation as tolerated Continue vitamin C, zinc, antitussives, supportive care Recent Labs    08/18/19 0315 08/19/19 0220 08/20/19 0115  DDIMER 6.03* 6.35* 3.48*  LDH 239* 240* 246*  CRP 1.3* 1.0* 1.6*    Pulmonary vascular congestion, history of grade 1 diastolic dysfunction, questionably an exacerbation, resolving -Chest x-ray showing cardiomegaly with vascular congestion.  BNP slightly elevated.   -Last echo done January 2018 with LVEF 55 to [id number], grade 1 diastolic dysfunction, and mild LV hypertrophy. -Echo as below poor study - unable to assess diastolic function; EF [id number] -Cardiac monitoring -IV Lasix 20 mg x1 at admission - appears more euvolemic today -Monitor intake and output, daily weights, low-sodium diet with fluid restriction  Acute metabolic encephalopathy, likely secondary to above, resolved -Related to COVID-19 infection -Without focal neuro deficit appreciated on exam -Resolving with treatment of viral infection as above - more awake alert oriented this morning -TSH, B12 within normal limits  Chronic thrombocytosis -Platelet count stable in the 600s, chronically elevated (baseline 500).  -Follow up outpatient for further workup  Hyperlipidemia -Continue home Lipitor  Hypertension -More well controlled today -Continue home amlodipine, Coreg -Continue hydralazine PRN  Bipolar disorder -Continue home Trileptal, Effexor  DVT prophylaxis: Lovenox Code Status: Full code Family Communication: Patient to update Disposition Plan: Anticipate discharge home after clinical improvement/completion of therapy; pending further eval from PT SNF may be necessary although patient requesting DC home if possible. Consults called: None Admission status patient continues as inpatient status, continues to require supplemental oxygen well above  baseline, IV antivirals and treatment, remains high risk  for decompensation given age, comorbid conditions and acute illness.  Objective: Vitals:   08/20/19 0700 08/20/19 0912 08/20/19 1037 08/20/19 1229  BP:  (!) 175/96 125/68   Pulse:      Resp:      Temp: (!) 97.5 F (36.4 C)     TempSrc: Oral     SpO2:   92% 91%  Weight:      Height:        Intake/Output Summary (Last 24 hours) at 08/20/2019 1243 Last data filed at 08/20/2019 0600 Gross per 24 hour  Intake 950 ml  Output 1550 ml  Net -600 ml   Filed Weights   08/16/19 1513  Weight: 117.9 kg    Examination:  General exam: Appears calm and comfortable, awake alert oriented x3 Respiratory system: Clear to auscultation. Respiratory effort normal. Cardiovascular system: S1 & S2 heard, RRR. No JVD, murmurs, rubs, gallops or clicks. No pedal edema. Gastrointestinal system: Abdomen is nondistended, soft and nontender. No organomegaly or masses felt. Normal bowel sounds heard. Central nervous system: Alert and oriented. No focal neurological deficits. Extremities: Symmetric 5 x 5 power. Skin: No rashes, lesions or ulcers   Data Reviewed: I have personally reviewed following labs and imaging studies  CBC: Recent Labs  Lab 08/16/19 1654 08/17/19 0500 08/18/19 0315 08/19/19 0220 08/20/19 0115  WBC 8.2 5.7 8.6 10.4 9.8  NEUTROABS 6.2 5.1 6.4 8.1* 7.7  HGB 13.4 13.1 11.7* 11.6* 11.6*  HCT 43.5 43.6 38.9 38.4 38.6  MCV 94.2 96.0 95.6 94.8 95.3  PLT 708* 619* 627* 615* [id number]*   Basic Metabolic Panel: Recent Labs  Lab 08/16/19 1654 08/17/19 0500 08/18/19 0315 08/19/19 0220 08/20/19 0115  NA 144 145 140 139 141  K 4.1 4.2 3.9 4.4 4.7  CL 97* 101 98 98 99  CO2 33* 33* 32 32 30  GLUCOSE 84 128* 96 100* 92  BUN 23 23 37* 41* 43*  CREATININE 1.19* 1.18* 1.27* 1.20* 0.80  CALCIUM 9.5 8.8* 9.0 9.2 9.7   GFR: Estimated Creatinine Clearance: 86.6 mL/min (by C-G formula based on SCr of 0.8 mg/dL). Liver Function  Tests: Recent Labs  Lab 08/16/19 1654 08/17/19 0500 08/18/19 0315 08/19/19 0220 08/20/19 0115  AST 27 22 19 21  34  ALT 26 22 22 24  43  ALKPHOS 125 123 108 103 98  BILITOT 0.5 0.4 0.5 0.8 0.2*  PROT 7.7 7.4 6.8 6.6 6.4*  ALBUMIN 3.4* 3.3* 3.0* 2.9* 3.0*   No results for input(s): LIPASE, AMYLASE in the last 168 hours. Recent Labs  Lab 08/16/19 2009  AMMONIA 25   CBG: Recent Labs  Lab 08/18/19 2004  GLUCAP 143*   Lipid Profile: No results for input(s): CHOL, HDL, LDLCALC, TRIG, CHOLHDL, LDLDIRECT in the last 72 hours. Thyroid Function Tests: No results for input(s): TSH, T4TOTAL, FREET4, T3FREE, THYROIDAB in the last 72 hours. Anemia Panel: No results for input(s): VITAMINB12, FOLATE, FERRITIN, TIBC, IRON, RETICCTPCT in the last 72 hours. Sepsis Labs: Recent Labs  Lab 08/16/19 1654 08/16/19 1820  PROCALCITON <0.10  --   LATICACIDVEN 1.0 1.7    Recent Results (from the past 240 hour(s))  Respiratory Panel by RT PCR (Flu A&B, Covid) - Nasopharyngeal Swab     Status: Abnormal   Collection Time: 08/16/19  4:21 PM   Specimen: Nasopharyngeal Swab  Result Value Ref Range Status   SARS Coronavirus 2 by RT PCR POSITIVE (A) NEGATIVE Final    Comment: RESULT CALLED TO, READ BACK BY AND VERIFIED  WITH: OXENDINE,J. RN @1917  ON 02.09.2021 BY COHEN,K (NOTE) SARS-CoV-2 target nucleic acids are DETECTED. SARS-CoV-2 RNA is generally detectable in upper respiratory specimens  during the acute phase of infection. Positive results are indicative of the presence of the identified virus, but do not rule out bacterial infection or co-infection with other pathogens not detected by the test. Clinical correlation with patient history and other diagnostic information is necessary to determine patient infection status. The expected result is Negative. Fact Sheet for Patients:  PinkCheek.be Fact Sheet for Healthcare  Providers: GravelBags.it This test is not yet approved or cleared by the [place] FDA and  has been authorized for detection and/or diagnosis of SARS-CoV-2 by FDA under an Emergency Use Authorization (EUA).  This EUA will remain in effect (meaning this test can  be used) for the duration of  the COVID-19 declaration under Section 564(b)(1) of the Act, 21 U.S.C. section 360bbb-3(b)(1), unless the authorization is terminated or revoked sooner.    Influenza A by PCR NEGATIVE NEGATIVE Final   Influenza B by PCR NEGATIVE NEGATIVE Final    Comment: (NOTE) The Xpert Xpress SARS-CoV-2/FLU/RSV assay is intended as an aid in  the diagnosis of influenza from Nasopharyngeal swab specimens and  should not be used as a sole basis for treatment. Nasal washings and  aspirates are unacceptable for Xpert Xpress SARS-CoV-2/FLU/RSV  testing. Fact Sheet for Patients: PinkCheek.be Fact Sheet for Healthcare Providers: GravelBags.it This test is not yet approved or cleared by the [place] FDA and  has been authorized for detection and/or diagnosis of SARS-CoV-2 by  FDA under an Emergency Use Authorization (EUA). This EUA will remain  in effect (meaning this test can be used) for the duration of the  Covid-19 declaration under Section 564(b)(1) of the Act, 21  U.S.C. section 360bbb-3(b)(1), unless the authorization is  terminated or revoked. Performed at [facility], [place] [address], [place], [place] [address]   Blood Culture (routine x 2)     Status: None (Preliminary result)   Collection Time: 08/16/19  4:50 PM   Specimen: BLOOD  Result Value Ref Range Status   Specimen Description   Final    BLOOD RIGHT ANTECUBITAL Performed at [facility] [address] [place], [place] [address]    Special Requests   Final    BOTTLES DRAWN AEROBIC AND ANAEROBIC Blood Culture  adequate volume Performed at [facility] [address], [place], [place] [address]    Culture   Final    NO GROWTH 3 DAYS Performed at [facility], [place] [address], [place], [place] [address]    Report Status PENDING  Incomplete  Blood Culture (routine x 2)     Status: None (Preliminary result)   Collection Time: 08/16/19  4:54 PM   Specimen: BLOOD  Result Value Ref Range Status   Specimen Description   Final    BLOOD LEFT WRIST Performed at [facility] [address], [place], [place] [address]    Special Requests   Final    BOTTLES DRAWN AEROBIC AND ANAEROBIC Blood Culture adequate volume Performed at [facility] [address] [place], [place] [address]    Culture   Final    NO GROWTH 3 DAYS Performed at [facility], [place] [address], [place], [place] [address]    Report Status PENDING  Incomplete     Radiology Studies: No results found.  Scheduled Meds: . amLODipine  5 mg Oral Q breakfast  . vitamin C  500 mg Oral Daily  . aspirin EC  81 mg Oral Daily  . atorvastatin  20 mg Oral q1800  . carvedilol  12.5 mg Oral BID WC  . dexamethasone (DECADRON) injection  6 mg Intravenous Q24H  . enoxaparin (LOVENOX) injection  60 mg Subcutaneous Q12H  . famotidine  20 mg Oral QHS  . fluticasone  1 spray Each Nare Daily  . loratadine  10 mg Oral Daily  . multivitamin with minerals  1 tablet Oral Daily  . Oxcarbazepine  450 mg Oral BID  . venlafaxine XR  75 mg Oral Q breakfast  . zinc sulfate  220 mg Oral Daily   Continuous Infusions:    LOS: 4 days   Time spent: >30min  Little Ishikawa, DO [facility]  If 7PM-7AM, please contact night-coverage [email or web address]  08/20/2019, 12:43 PM

## 2019-08-20 NOTE — Progress Notes (Signed)
Occupational Therapy Treatment Patient Details Name: Marie Ramos MRN: [id number] DOB: January 10, 1956 Today's Date: 08/20/2019    History of present illness 64 year old female who presented to the ED for evaluation of altered mental status. Patient also noting SOB but unsure how many days she has been experiencing this. Oxygen saturation 82% on room air, improved with 3 L supplemental oxygen. CXR: patchy opacities in the periphery of the right mid upper lung suspicious for pneumonia/atypical viral infection as well as cardiomegaly with vascular congestion. +COVID. Patient received Tylenol, Decadron, and Remdesivir. PMH: bipolar disorder, CVA, TBI, anxoic brain injury, HOCM, QT prolongation, Vfib cardiac arrest s/p AICD, HTN, HLD, history DVT.    OT comments  Patient up in chair on arrival and ready for therapy.  She required supervision for transfers and min guard for mobility with walker.  She walked 135ft in room and completed standing grooming at sink. Patient required one seated rest break during standing activities.  See ambulatory O2 note.  Cognitively patient is still requiring increased cueing with sequencing and demonstrating poor short term memory, but has improved since last visit.  Patient's mobility and activity tolerance also showing improvement.  Will continue to follow with OT acutely to address the deficits listed below.    Follow Up Recommendations  Home health OT    Equipment Recommendations  None recommended by OT    Recommendations for Other Services      Precautions / Restrictions Precautions Precautions: Fall Restrictions Weight Bearing Restrictions: No       Mobility Bed Mobility                  Transfers Overall transfer level: Needs assistance Equipment used: Rolling walker (2 wheeled) Transfers: Sit to/from Stand Sit to Stand: Supervision              Balance Overall balance assessment: Needs assistance Sitting-balance support: No upper  extremity supported;Feet supported Sitting balance-Leahy Scale: Good     Standing balance support: Bilateral upper extremity supported Standing balance-Leahy Scale: Fair                             ADL either performed or assessed with clinical judgement   ADL Overall ADL's : Needs assistance/impaired     Grooming: Wash/dry hands;Wash/dry face;Oral care;Brushing hair;Standing;Supervision/safety               Lower Body Dressing: Minimal assistance;Sitting/lateral leans   Toilet Transfer: Min guard;RW   Toileting- Clothing Manipulation and Hygiene: Supervision/safety;Sit to/from stand       Functional mobility during ADLs: Passenger transport manager     Praxis      Cognition Arousal/Alertness: Awake/alert Behavior During Therapy: WFL for tasks assessed/performed Overall Cognitive Status: Impaired/Different from baseline Area of Impairment: Attention;Memory;Following commands;Safety/judgement;Problem solving                   Current Attention Level: Selective Memory: Decreased short-term memory Following Commands: Follows multi-step commands inconsistently;Follows multi-step commands with increased time Safety/Judgement: Decreased awareness of safety;Decreased awareness of deficits   Problem Solving: Slow processing;Decreased initiation;Requires verbal cues;Difficulty sequencing General Comments: Cognitive level has improved some since last visit        Exercises Exercises: Other exercises Other Exercises Other Exercises: x10 fultter valve Other Exercises: x10 incentive spirometer Other Exercises: pursed lip breathin   Shoulder Instructions  General Comments      Pertinent Vitals/ Pain       Pain Assessment: Faces Faces Pain Scale: Hurts a little bit Pain Location: R forearm where IV is Pain Descriptors / Indicators: Discomfort Pain Intervention(s): Limited activity within patient's  tolerance;Monitored during session  Home Living                                          Prior Functioning/Environment              Frequency  Min 3X/week        Progress Toward Goals  OT Goals(current goals can now be found in the care plan section)  Progress towards OT goals: Progressing toward goals  Acute Rehab OT Goals Patient Stated Goal: Home to husband OT Goal Formulation: With patient Time For Goal Achievement: 09/01/19 Potential to Achieve Goals: Good  Plan Discharge plan remains appropriate    Co-evaluation                 AM-PAC OT "6 Clicks" Daily Activity     Outcome Measure   Help from another person eating meals?: None Help from another person taking care of personal grooming?: A Little Help from another person toileting, which includes using toliet, bedpan, or urinal?: A Little Help from another person bathing (including washing, rinsing, drying)?: A Little Help from another person to put on and taking off regular upper body clothing?: A Little Help from another person to put on and taking off regular lower body clothing?: A Little 6 Click Score: 19    End of Session Equipment Utilized During Treatment: Oxygen;Rolling walker;Gait belt  OT Visit Diagnosis: Unsteadiness on feet (R26.81);Muscle weakness (generalized) (M62.81);Other symptoms and signs involving cognitive function   Activity Tolerance Patient tolerated treatment well   Patient Left in chair;with call bell/phone within reach;with chair alarm set   Nurse Communication Mobility status        Time: [id number] OT Time Calculation (min): 47 min  Charges: OT General Charges $OT Visit: 1 Visit OT Treatments $Self Care/Home Management : 38-52 mins  Marie Ramos, OTR/L    [name] 08/20/2019, 2:34 PM

## 2019-08-21 LAB — CBC WITH DIFFERENTIAL/PLATELET
Abs Immature Granulocytes: 0.03 10*3/uL (ref 0.00–0.07)
Basophils Absolute: 0 10*3/uL (ref 0.0–0.1)
Basophils Relative: 0 %
Eosinophils Absolute: 0 10*3/uL (ref 0.0–0.5)
Eosinophils Relative: 0 %
HCT: 37.7 % (ref 36.0–46.0)
Hemoglobin: 11.5 g/dL — ABNORMAL LOW (ref 12.0–15.0)
Immature Granulocytes: 0 %
Lymphocytes Relative: 7 %
Lymphs Abs: 0.7 10*3/uL (ref 0.7–4.0)
MCH: 28.4 pg (ref 26.0–34.0)
MCHC: 30.5 g/dL (ref 30.0–36.0)
MCV: 93.1 fL (ref 80.0–100.0)
Monocytes Absolute: 1 10*3/uL (ref 0.1–1.0)
Monocytes Relative: 11 %
Neutro Abs: 7.6 10*3/uL (ref 1.7–7.7)
Neutrophils Relative %: 82 %
Platelets: 623 10*3/uL — ABNORMAL HIGH (ref 150–400)
RBC: 4.05 MIL/uL (ref 3.87–5.11)
RDW: 14.6 % (ref 11.5–15.5)
WBC: 9.3 10*3/uL (ref 4.0–10.5)
nRBC: 0 % (ref 0.0–0.2)

## 2019-08-21 LAB — CULTURE, BLOOD (ROUTINE X 2)
Culture: NO GROWTH
Culture: NO GROWTH
Special Requests: ADEQUATE
Special Requests: ADEQUATE

## 2019-08-21 LAB — D-DIMER, QUANTITATIVE: D-Dimer, Quant: 3.46 ug/mL-FEU — ABNORMAL HIGH (ref 0.00–0.50)

## 2019-08-21 LAB — COMPREHENSIVE METABOLIC PANEL
ALT: 48 U/L — ABNORMAL HIGH (ref 0–44)
AST: 31 U/L (ref 15–41)
Albumin: 3.1 g/dL — ABNORMAL LOW (ref 3.5–5.0)
Alkaline Phosphatase: 97 U/L (ref 38–126)
Anion gap: 9 (ref 5–15)
BUN: 40 mg/dL — ABNORMAL HIGH (ref 8–23)
CO2: 33 mmol/L — ABNORMAL HIGH (ref 22–32)
Calcium: 9.6 mg/dL (ref 8.9–10.3)
Chloride: 98 mmol/L (ref 98–111)
Creatinine, Ser: 0.91 mg/dL (ref 0.44–1.00)
GFR calc Af Amer: >60 mL/min (ref >60)
GFR calc non Af Amer: >60 mL/min (ref >60)
Glucose, Bld: 112 mg/dL — ABNORMAL HIGH (ref 70–99)
Potassium: 5 mmol/L (ref 3.5–5.1)
Sodium: 140 mmol/L (ref 135–145)
Total Bilirubin: 0.6 mg/dL (ref 0.3–1.2)
Total Protein: 6.8 g/dL (ref 6.5–8.1)

## 2019-08-21 LAB — LACTATE DEHYDROGENASE: LDH: 248 U/L — ABNORMAL HIGH (ref 98–192)

## 2019-08-21 LAB — C-REACTIVE PROTEIN: CRP: 1 mg/dL — ABNORMAL HIGH (ref <1.0)

## 2019-08-21 MED ORDER — ALPRAZOLAM 0.25 MG PO TABS
0.2500 mg | ORAL_TABLET | Freq: Every evening | ORAL | Status: DC | PRN
  Administered 2019-08-21: 21:00:00 0.25 mg via ORAL
  Filled 2019-08-21: qty 1

## 2019-08-21 MED ORDER — TRIAMCINOLONE ACETONIDE 0.1 % EX OINT
1.0000 "application " | TOPICAL_OINTMENT | Freq: Two times a day (BID) | CUTANEOUS | Status: DC
  Administered 2019-08-22: 1 via TOPICAL
  Filled 2019-08-21: qty 15

## 2019-08-21 MED ORDER — HYDROCERIN EX CREA
TOPICAL_CREAM | Freq: Every day | CUTANEOUS | Status: DC
  Filled 2019-08-21: qty 113

## 2019-08-21 MED ORDER — TRAMADOL HCL 50 MG PO TABS: 50.0000 mg | ORAL_TABLET | Freq: Every day | ORAL | Status: DC | PRN

## 2019-08-21 MED ORDER — FUROSEMIDE 20 MG PO TABS
20.0000 mg | ORAL_TABLET | Freq: Every day | ORAL | Status: DC
  Administered 2019-08-22: 10:00:00 20 mg via ORAL
  Filled 2019-08-21: qty 1

## 2019-08-21 MED ORDER — LOSARTAN POTASSIUM 25 MG PO TABS
100.0000 mg | ORAL_TABLET | Freq: Every day | ORAL | Status: DC
  Administered 2019-08-21 – 2019-08-22 (×2): 100 mg via ORAL
  Filled 2019-08-21 (×2): qty 4

## 2019-08-21 NOTE — Progress Notes (Signed)
PROGRESS NOTE    Marie Ramos  [id number] DOB: 1955/11/07 DOA: 08/16/2019 PCP: Lawerance Cruel, MD   HPI: Marie Ramos is a 64 y.o. female with medical history significant of bipolar disorder, CVA, TBI, anoxic brain injury, HOCM, QT prolongation, history of V. Fib cardiac arrest status post AICD, hypertension, hyperlipidemia, history of DVT presenting to the ED for evaluation of altered mental status.  History limited as patient appears confused.  States she has bipolar disorder but her husband is concerned that for the past 2 days she has not been acting right.  Reports having shortness of breath but is not sure when it started.  No other complaints.  Denies fevers, chills, chest pain, nausea, vomiting, abdominal pain, or diarrhea.  No additional history could be obtained from the patient. In ED: Oxygen saturation 82% on room air, improved with 3 L supplemental oxygen. Chest x-ray showing patchy opacities in the periphery of the right mid upper lung suspicious for pneumonia/atypical viral infection.  Also showing cardiomegaly with vascular congestion. Patient received Tylenol, Decadron, and remdesivir.  Subjective: No acute issues or events overnight, patient indicates her respiratory status is improving but not yet back to baseline, remains molderately dyspneic with exertion, declines chest pain, nausea, vomiting, diarrhea, constipation, headache, fevers, chills.    Assessment & Plan:   Principal Problem:   Pneumonia due to COVID-19 virus Active Problems:   Acute respiratory failure (HCC)   Essential hypertension   Pulmonary vascular congestion   Acute metabolic encephalopathy   Assessment/Plan  Acute hypoxic respiratory failure in the setting of COVID-19 pneumonia, POA SpO2: 95 % O2 Flow Rate (L/min): 2 L/min FiO2 (%): (!) 3 % Continue to wean oxygen as tolerated: goal sats 88-92% Ambulatory oxygen screen - 83% on RA while ambulating/91% on 2L [place] - follow repeat  Remdesivir completed Decadron per protocol - stop date 2/17 Albuterol as necessary Continue to prone incentive spirometry and early ambulation as tolerated Continue vitamin C, zinc, antitussives, supportive care Recent Labs    08/19/19 0220 08/20/19 0115 08/21/19 0115  DDIMER 6.35* 3.48* 3.46*  LDH 240* 246* 248*  CRP 1.0* 1.6* 1.0*    Pulmonary vascular congestion, history of grade 1 diastolic dysfunction, questionably an exacerbation, resolving -Chest x-ray showing cardiomegaly with vascular congestion.  BNP slightly elevated.   -Last echo done January 2018 with LVEF 55 to [id number], grade 1 diastolic dysfunction, and mild LV hypertrophy. -Echo as below poor study - unable to assess diastolic function; EF [id number] -Cardiac monitoring -IV Lasix 20 mg x1 at admission - appears more euvolemic today -Monitor intake and output, daily weights, low-sodium diet with fluid restriction  Acute metabolic encephalopathy, likely secondary to above, resolved -Related to COVID-19 infection -Without focal neuro deficit appreciated on exam -Resolving with treatment of viral infection as above - more awake alert oriented this morning -TSH, B12 within normal limits  Ambulatory dysfunction -Likely acute on chronic -PT following - recs for [facility] vs SNF -Patient continues to improve - does have large staircase at home, may need additional PT pending clinical course  Chronic thrombocytosis -Platelet count stable in the 600s, chronically elevated (baseline 500).  -Follow up outpatient for further workup  Hyperlipidemia -Continue home Lipitor  Hypertension -More well controlled today -Continue home amlodipine, Coreg -Continue hydralazine PRN  Bipolar disorder -Continue home Trileptal, Effexor  DVT prophylaxis: Lovenox Code Status: Full code Family Communication: Patient to update husband and son Disposition Plan: Anticipate discharge home after clinical improvement/completion of therapy;  pending further  eval from PT SNF may be necessary although patient requesting DC home if possible. Consults called: None Admission status patient continues as inpatient status, continues to require supplemental oxygen well above baseline, remains high risk for decompensation given age, comorbid conditions and acute illness.  Objective: Vitals:   08/21/19 0558 08/21/19 0559 08/21/19 0730 08/21/19 0808  BP: (!) 173/91   (!) 124/93  Pulse:  66  68  Resp:    18  Temp: 98.1 F (36.7 C)   97.8 F (36.6 C)  TempSrc: Oral   Oral  SpO2:  95% 94% 95%  Weight:      Height:        Intake/Output Summary (Last 24 hours) at 08/21/2019 1529 Last data filed at 08/20/2019 1700 Gross per 24 hour  Intake -  Output 2 ml  Net -2 ml   Filed Weights   08/16/19 1513  Weight: 117.9 kg    Examination:  General:  Pleasantly resting in bed, No acute distress. HEENT:  Normocephalic atraumatic.  Sclerae nonicteric, noninjected.  Extraocular movements intact bilaterally. Neck:  Without mass or deformity.  Trachea is midline. Lungs:  Clear to auscultate bilaterally without rhonchi, wheeze, or rales. Heart:  Regular rate and rhythm.  Without murmurs, rubs, or gallops. Abdomen:  Soft, nontender, nondistended.  Without guarding or rebound. Extremities: Without cyanosis, clubbing, edema, or obvious deformity. Vascular:  Dorsalis pedis and posterior tibial pulses palpable bilaterally. Skin:  Warm and dry, no erythema, no ulcerations.   Data Reviewed: I have personally reviewed following labs and imaging studies  CBC: Recent Labs  Lab 08/17/19 0500 08/18/19 0315 08/19/19 0220 08/20/19 0115 08/21/19 0115  WBC 5.7 8.6 10.4 9.8 9.3  NEUTROABS 5.1 6.4 8.1* 7.7 7.6  HGB 13.1 11.7* 11.6* 11.6* 11.5*  HCT 43.6 38.9 38.4 38.6 37.7  MCV 96.0 95.6 94.8 95.3 93.1  PLT 619* 627* 615* 625* 123456*   Basic Metabolic Panel: Recent Labs  Lab 08/17/19 0500 08/18/19 0315 08/19/19 0220 08/20/19 0115 08/21/19  0115  NA 145 140 139 141 140  K 4.2 3.9 4.4 4.7 5.0  CL 101 98 98 99 98  CO2 33* 32 32 30 33*  GLUCOSE 128* 96 100* 92 112*  BUN 23 37* 41* 43* 40*  CREATININE 1.18* 1.27* 1.20* 0.80 0.91  CALCIUM 8.8* 9.0 9.2 9.7 9.6   GFR: Estimated Creatinine Clearance: 76.1 mL/min (by C-G formula based on SCr of 0.91 mg/dL). Liver Function Tests: Recent Labs  Lab 08/17/19 0500 08/18/19 0315 08/19/19 0220 08/20/19 0115 08/21/19 0115  AST 22 19 21  34 31  ALT 22 22 24  43 48*  ALKPHOS 123 108 103 98 97  BILITOT 0.4 0.5 0.8 0.2* 0.6  PROT 7.4 6.8 6.6 6.4* 6.8  ALBUMIN 3.3* 3.0* 2.9* 3.0* 3.1*   No results for input(s): LIPASE, AMYLASE in the last 168 hours. Recent Labs  Lab 08/16/19 2009  AMMONIA 25   CBG: Recent Labs  Lab 08/18/19 2004  GLUCAP 143*   Lipid Profile: No results for input(s): CHOL, HDL, LDLCALC, TRIG, CHOLHDL, LDLDIRECT in the last 72 hours. Thyroid Function Tests: No results for input(s): TSH, T4TOTAL, FREET4, T3FREE, THYROIDAB in the last 72 hours. Anemia Panel: No results for input(s): VITAMINB12, FOLATE, FERRITIN, TIBC, IRON, RETICCTPCT in the last 72 hours. Sepsis Labs: Recent Labs  Lab 08/16/19 1654 08/16/19 1820  PROCALCITON <0.10  --   LATICACIDVEN 1.0 1.7    Recent Results (from the past 240 hour(s))  Respiratory Panel by RT PCR (  Flu A&B, Covid) - Nasopharyngeal Swab     Status: Abnormal   Collection Time: 08/16/19  4:21 PM   Specimen: Nasopharyngeal Swab  Result Value Ref Range Status   SARS Coronavirus 2 by RT PCR POSITIVE (A) NEGATIVE Final    Comment: RESULT CALLED TO, READ BACK BY AND VERIFIED WITH: OXENDINE,J. RN @1917  ON 02.09.2021 BY COHEN,K (NOTE) SARS-CoV-2 target nucleic acids are DETECTED. SARS-CoV-2 RNA is generally detectable in upper respiratory specimens  during the acute phase of infection. Positive results are indicative of the presence of the identified virus, but do not rule out bacterial infection or co-infection with  other pathogens not detected by the test. Clinical correlation with patient history and other diagnostic information is necessary to determine patient infection status. The expected result is Negative. Fact Sheet for Patients:  PinkCheek.be Fact Sheet for Healthcare Providers: GravelBags.it This test is not yet approved or cleared by the [place] FDA and  has been authorized for detection and/or diagnosis of SARS-CoV-2 by FDA under an Emergency Use Authorization (EUA).  This EUA will remain in effect (meaning this test can  be used) for the duration of  the COVID-19 declaration under Section 564(b)(1) of the Act, 21 U.S.C. section 360bbb-3(b)(1), unless the authorization is terminated or revoked sooner.    Influenza A by PCR NEGATIVE NEGATIVE Final   Influenza B by PCR NEGATIVE NEGATIVE Final    Comment: (NOTE) The Xpert Xpress SARS-CoV-2/FLU/RSV assay is intended as an aid in  the diagnosis of influenza from Nasopharyngeal swab specimens and  should not be used as a sole basis for treatment. Nasal washings and  aspirates are unacceptable for Xpert Xpress SARS-CoV-2/FLU/RSV  testing. Fact Sheet for Patients: PinkCheek.be Fact Sheet for Healthcare Providers: GravelBags.it This test is not yet approved or cleared by the [place] FDA and  has been authorized for detection and/or diagnosis of SARS-CoV-2 by  FDA under an Emergency Use Authorization (EUA). This EUA will remain  in effect (meaning this test can be used) for the duration of the  Covid-19 declaration under Section 564(b)(1) of the Act, 21  U.S.C. section 360bbb-3(b)(1), unless the authorization is  terminated or revoked. Performed at [facility], [place] [address] [place], [place] [address]   Blood Culture (routine x 2)     Status: None   Collection Time: 08/16/19  4:50 PM    Specimen: BLOOD  Result Value Ref Range Status   Specimen Description   Final    BLOOD RIGHT ANTECUBITAL Performed at [facility] [address], [place], [place] [address]    Special Requests   Final    BOTTLES DRAWN AEROBIC AND ANAEROBIC Blood Culture adequate volume Performed at [facility] [address], [place], [place] [address]    Culture   Final    NO GROWTH 5 DAYS Performed at [facility], [place] [address] [place], [place] [address]    Report Status 08/21/2019 FINAL  Final  Blood Culture (routine x 2)     Status: None   Collection Time: 08/16/19  4:54 PM   Specimen: BLOOD  Result Value Ref Range Status   Specimen Description   Final    BLOOD LEFT WRIST Performed at [facility] [address], [place], [place] [address]    Special Requests   Final    BOTTLES DRAWN AEROBIC AND ANAEROBIC Blood Culture adequate volume Performed at [facility] [address] [place],  [place] [address]    Culture   Final    NO GROWTH 5 DAYS Performed at [facility], [place] [address], [place], [place] [address]    Report Status 08/21/2019 FINAL  Final     Radiology Studies: No results found.  Scheduled Meds: . amLODipine  5 mg Oral Q breakfast  . vitamin C  500 mg Oral Daily  . aspirin EC  81 mg Oral Daily  . atorvastatin  20 mg Oral q1800  . carvedilol  12.5 mg Oral BID WC  . dexamethasone (DECADRON) injection  6 mg Intravenous Q24H  . enoxaparin (LOVENOX) injection  60 mg Subcutaneous Q12H  . famotidine  20 mg Oral QHS  . fluticasone  1 spray Each Nare Daily  . furosemide  20 mg Oral Daily  . loratadine  10 mg Oral Daily  . losartan  100 mg Oral Daily  . multivitamin with minerals  1 tablet Oral Daily  . Oxcarbazepine  450 mg Oral BID  . triamcinolone ointment  1 application Topical BID  . venlafaxine XR  75 mg Oral Q breakfast  . vitamin E   Topical Daily  . zinc sulfate  220 mg  Oral Daily   Continuous Infusions:    LOS: 5 days   Time spent: >68min  Little Ishikawa, DO [facility]  If 7PM-7AM, please contact night-coverage [email or web address]  08/21/2019, 3:29 PM

## 2019-08-21 NOTE — Progress Notes (Signed)
Physical Therapy Treatment Patient Details Name: Marie Ramos MRN: [id number] DOB: December 09, 1955 Today's Date: 08/21/2019    History of Present Illness 64 year old female who presented to the ED for evaluation of altered mental status. Patient also noting SOB but unsure how many days she has been experiencing this. Oxygen saturation 82% on room air, improved with 3 L supplemental oxygen. CXR: patchy opacities in the periphery of the right mid upper lung suspicious for pneumonia/atypical viral infection as well as cardiomegaly with vascular congestion. +COVID. Patient received Tylenol, Decadron, and Remdesivir. PMH: bipolar disorder, CVA, TBI, anxoic brain injury, HOCM, QT prolongation, Vfib cardiac arrest s/p AICD, HTN, HLD, history DVT.     PT Comments    Pt demonstrated improved activity tolerance today. She was able to perform toilet transfer with supervision, requiring total assistance for anal hygiene, but was able to perform vaginal hygiene with VCs for toilet paper location. Pt was able to demonstrate improved ambulation tolerance, walking 60 ft on 1L/min O2, with vitals remaining WFL and no reports of adverse effects. Pt was able to recover with 1 min sitting rest break before performing additional anxiety management exercises (deep breathing, progressive muscle relaxation, grounding techniques). Pt verbalized understanding and given hand out. Also performed standing exercises 10x each side with no reports of adverse effects. Due to continued weakness, anxiety, and unsteadiness in gait/ambulation will continue to follow patient acutely with physical therapy.   Follow Up Recommendations  Supervision/Assistance - 24 hour;Home health PT;SNF     Equipment Recommendations  Rolling walker with 5" wheels    Recommendations for Other Services       Precautions / Restrictions Restrictions Weight Bearing Restrictions: No    Mobility  Bed Mobility               General bed mobility  comments: Up in chair  Transfers Overall transfer level: Needs assistance Equipment used: Rolling walker (2 wheeled) Transfers: Sit to/from Stand Sit to Stand: Supervision         General transfer comment: sit<>stand from recliner chair without AD and contact guard, sit<>stand from recliner chair with RW and supervision  Ambulation/Gait Ambulation/Gait assistance: Min guard Gait Distance (Feet): 60 Feet Assistive device: Rolling walker (2 wheeled) Gait Pattern/deviations: Step-through pattern;Decreased step length - right;Decreased step length - left Gait velocity: decreased Gait velocity interpretation: <1.31 ft/sec, indicative of household ambulator General Gait Details: CGA with RW, no LOB demonstrated, very slow gait and reports high anxiety (though not specific to activity)   Chief Strategy Officer    Modified Rankin (Stroke Patients Only)       Balance Overall balance assessment: Needs assistance Sitting-balance support: No upper extremity supported;Feet supported Sitting balance-Leahy Scale: Good     Standing balance support: Bilateral upper extremity supported Standing balance-Leahy Scale: Fair Standing balance comment: Improved mobility with use of RW, continues to require VCs for sequencing for patient safety with tranfers and turning                            Cognition Arousal/Alertness: Awake/alert Behavior During Therapy: WFL for tasks assessed/performed Overall Cognitive Status: Impaired/Different from baseline Area of Impairment: Safety/judgement;Memory                   Current Attention Level: Focused Memory: Decreased short-term memory Following Commands: Follows multi-step commands with increased time Safety/Judgement: Decreased awareness of safety;Decreased  awareness of deficits   Problem Solving: Requires verbal cues;Requires tactile cues;Slow processing General Comments: Cognitive level has improved  some since last visit      Exercises General Exercises - Lower Extremity Ankle Circles/Pumps: Strengthening;10 reps;Standing Hip Flexion/Marching: Strengthening;10 reps;Standing Mini-Sqauts: Strengthening;10 reps;Standing Other Exercises Other Exercises: x10 fultter valve Other Exercises: x10 incentive spirometer Other Exercises: pursed lip breathin Other Exercises: Anxiety management exercises Other Exercises: deep breathing x 10 4s in 4s out    General Comments        Pertinent Vitals/Pain Pain Assessment: No/denies pain Pain Score: 0-No pain    Home Living                      Prior Function            PT Goals (current goals can now be found in the care plan section) Acute Rehab PT Goals Patient Stated Goal: Home to husband Potential to Achieve Goals: Good Progress towards PT goals: Progressing toward goals    Frequency    Min 3X/week      PT Plan Current plan remains appropriate    Co-evaluation              AM-PAC PT "6 Clicks" Mobility   Outcome Measure  Help needed turning from your back to your side while in a flat bed without using bedrails?: A Little Help needed moving from lying on your back to sitting on the side of a flat bed without using bedrails?: A Little Help needed moving to and from a bed to a chair (including a wheelchair)?: A Little Help needed standing up from a chair using your arms (e.g., wheelchair or bedside chair)?: A Little Help needed to walk in hospital room?: A Little Help needed climbing 3-5 steps with a railing? : A Lot 6 Click Score: 17    End of Session Equipment Utilized During Treatment: Oxygen Activity Tolerance: Patient limited by fatigue;Patient limited by lethargy Patient left: in chair;with call bell/phone within reach;with nursing/sitter in room Nurse Communication: Mobility status PT Visit Diagnosis: Unsteadiness on feet (R26.81);Other abnormalities of gait and mobility (R26.89);Difficulty in  walking, not elsewhere classified (R26.2)     Time: [id number] PT Time Calculation (min) (ACUTE ONLY): 50 min  Charges:  $Gait Training: 8-22 mins $Therapeutic Exercise: 8-22 mins $Therapeutic Activity: 8-22 mins                     Ann Held PT, DPT [place] P: [place] 08/21/2019, 9:45 AM

## 2019-08-22 MED ORDER — ENOXAPARIN SODIUM 60 MG/0.6ML ~~LOC~~ SOLN: 60.0000 mg | SUBCUTANEOUS | Status: DC

## 2019-08-22 MED ORDER — PREDNISONE 10 MG PO TABS: ORAL_TABLET | ORAL | 0 refills | Status: AC

## 2019-08-22 NOTE — Progress Notes (Signed)
Physical Therapy Treatment Patient Details Name: Marie Ramos MRN: [id number] DOB: 12-08-1955 Today's Date: 08/22/2019    History of Present Illness 64 year old female who presented to the ED for evaluation of altered mental status. Patient also noting SOB but unsure how many days she has been experiencing this. Oxygen saturation 82% on room air, improved with 3 L supplemental oxygen. CXR: patchy opacities in the periphery of the right mid upper lung suspicious for pneumonia/atypical viral infection as well as cardiomegaly with vascular congestion. +COVID. Patient received Tylenol, Decadron, and Remdesivir. PMH: bipolar disorder, CVA, TBI, anxoic brain injury, HOCM, QT prolongation, Vfib cardiac arrest s/p AICD, HTN, HLD, history DVT.     PT Comments    Patient does desat on room air to mid 80s but recovers quickly with seated rest (30 seconds or less). RN placed 1L suppl oxygen during session. Patient rates SOB 6/10 with mobility in room on room air using RW. Patient has a RW at home to use. Discussed need for first floor set-up at home or having assistance for negotiating the stairs down in the morning, staying on first floor, then up in the evening with assistance. With patient's permission, called patient's husband during session. Patient's husband aware of patient's current level of mobility and PT recommendations. If this level of supervision/assistance cannot be provided upon discharge discussed need for SNF for short term rehabilitation. Patient's husband reported he or someone will be with her upon discharge home and that this is not the first time she has had difficulty with her mobility. Plan as of right now, is home with home PT, use of RW, and husband there to support as needed.    08/22/19 1101  Vitals  Pulse Rate 88  Pulse Rate Source Monitor  Oxygen Therapy  SpO2 (!) 84 % (briefly after ambulation to restroom, recovered in 20 secs)     08/22/19 1117  Oxygen Therapy   SpO2 (!) 85 % (recovers to 90% or better < 30 seconds seated rest)  O2 Device Room Air  Patient Activity (if Appropriate) Other (Comment) (post ambulation)  Pulse Oximetry Type Continuous   84% post stair negotiation on room air    08/22/19 1135  Oxygen Therapy  SpO2 91 %  O2 Device Nasal Cannula  O2 Flow Rate (L/min) 1 L/min  Patient Activity (if Appropriate)  (stair negotiation)  Pulse Oximetry Type Continuous  Pain Assessment  Pain Scale 0-10  Pain Score 8  Pain Type Chronic pain  Pain Location Knee  Pain Orientation Right  Pain Intervention(s)  (at home, pt rubs her knee, wears sneakers for pain mgmt)        Follow Up Recommendations  Supervision/Assistance - 24 hour;Home health PT(spoke with patient's husband via phone during session)     Equipment Recommendations  (patient owns RW for use at home)       Precautions / Restrictions Precautions Precautions: Fall Precaution Comments: Cues and education to not hold breath with mobility. Patient does utilize PLB technique during recovery.    Mobility  Bed Mobility   General bed mobility comments: Patient already OOB in chair upon PT arrival.  Transfers Overall transfer level: Needs assistance Equipment used: None;Rolling walker (2 wheeled) Transfers: Sit to/from Stand Sit to Stand: Supervision;Modified independent (Device/Increase time)   General transfer comment: sit<>stand from recliner chair with and without RW (supervision to modI with RW, cl supervision to CGA without RW), toilet transfer with RW modI  Ambulation/Gait Ambulation/Gait assistance: Supervision;Min guard Gait Distance (Feet):  30 Feet(x 2, 60, 20ft x 2) Assistive device: Rolling walker (2 wheeled);None Gait Pattern/deviations: Step-through pattern;Step-to pattern;Decreased step length - right;Decreased step length - left;Decreased stance time - right Gait velocity: decreased   General Gait Details: Supervision with use of RW for  ambulation to and from bathroom and ambulation outside the room on room air. O2 saturation does decrease to 85% after mobility but patient quickly recovers with seated rest to >/=90%. Ambulation short distances in room without AD and min guard/CGA.    Stairs Stairs: Yes Stairs assistance: Min assist;Mod assist Stair Management: Step to pattern(L then R rail) Number of Stairs: 2(4, 1) General stair comments: Less assistance required with use of railing on L which is the side the railing is at home. MinA for negotiating up and down stairs with railing on L and HHA on R. Increased assistance required when patient attempted to negotiate with BUEs on railing. [place] required for negotiating up/down step with railing on R and HHA on L. Luckily, patient has railing on L side at home. Sneakers worn for last two stair negotiations to decrease her chronic knee pain and increase indepedence.       Balance Overall balance assessment: Needs assistance Sitting-balance support: Feet supported Sitting balance-Leahy Scale: (Good+) Sitting balance - Comments: Patient able to doff hospital socks, don personal socks and sneakers sitting in recliner chair with rest breaks as needed.   Standing balance support: Single extremity supported;Bilateral upper extremity supported;No upper extremity supported Standing balance-Leahy Scale: (Good- without use of AD) Standing balance comment: Supervision to modI with use of RW. Static standing balance at sink for hand hygiene modI.       Cognition Arousal/Alertness: Awake/alert Behavior During Therapy: Anxious Overall Cognitive Status: Within Functional Limits for tasks assessed       Exercises Other Exercises Other Exercises: flutter valve x 10 Other Exercises: incentive spirometer x 10 (max 825)    General Comments        Pertinent Vitals/Pain Pain Assessment: 0-10 Pain Score: 8  Pain Location: R knee Pain Descriptors / Indicators: (chronic osteoarthritis  pain) Pain Intervention(s): (mobility to tolerance, use of sneakers for part of session)    PT Goals (current goals can now be found in the care plan section) Acute Rehab PT Goals Potential to Achieve Goals: Good Progress towards PT goals: Progressing toward goals    Frequency    Min 3X/week      PT Plan Discharge plan needs to be updated       AM-PAC PT "6 Clicks" Mobility   Outcome Measure  Help needed turning from your back to your side while in a flat bed without using bedrails?: A Little Help needed moving from lying on your back to sitting on the side of a flat bed without using bedrails?: A Little Help needed moving to and from a bed to a chair (including a wheelchair)?: A Little Help needed standing up from a chair using your arms (e.g., wheelchair or bedside chair)?: None Help needed to walk in hospital room?: A Little Help needed climbing 3-5 steps with a railing? : A Little 6 Click Score: 19    End of Session Equipment Utilized During Treatment: Oxygen;Gait belt Activity Tolerance: Patient tolerated treatment well Patient left: in chair;with call bell/phone within reach Nurse Communication: Mobility status(oxygen saturation with mobility) PT Visit Diagnosis: Unsteadiness on feet (R26.81);Other abnormalities of gait and mobility (R26.89);Difficulty in walking, not elsewhere classified (R26.2)     Time: [id number] PT Time Calculation (min) (  ACUTE ONLY): 76 min  Charges:  $Gait Training: 68-82 mins                     Birdie Hopes, DPT, PT Acute Rehab [phone]     Birdie Hopes 08/22/2019, 1:45 PM

## 2019-08-22 NOTE — Progress Notes (Signed)
Pt asleep and O2 sats dropping to the 70%s, [place] 2L applied. O2 sats up to 92%

## 2019-08-22 NOTE — TOC Transition Note (Signed)
Transition of Care [facility]) - CM/SW Discharge Note   Patient Details  Name: Marie Ramos MRN: [id number] Date of Birth: 05-12-1956  [facility] CM/SW Contact:  Ross Ludwig, LCSW Phone Number: 08/22/2019, 5:38 PM   Clinical Narrative:    Patient is a 64 year old female who is alert and oriented x4.  Patient lives with her husband Nathaneil Canary, and she has never had home health before.  CSW spoke to patient's husband to discuss home health services, and what to expect with home health.  CSW informed him of what agencies are in network with insurance, and he agreed to have [facility] see patient for home health PT.  Patient's husband was not sure if they will need home health, CSW suggested that it gets set up from hospital, and then if they decide they don't want it they can always cancel.  Patient's husband agreed to suggestion.  Patient's husband did not haven any other questions or concerns.    Patient will be going home with home health through [place].  CSW signing off please reconsult with any other social work needs, home health agency has been notified of planned discharge.      Final next level of care: [place] Barriers to Discharge: Barriers Resolved   Patient Goals and CMS Choice Patient states their goals for this hospitalization and ongoing recovery are:: To return back home with her husband with home health PT CMS Medicare.gov Compare Post Acute Care list provided to:: Patient Represenative (must comment) Choice offered to / list presented to : Spouse  Discharge Placement  Patient will be discharging back home with home health PT and will be a new oxygen.                     Discharge Plan and Services    Patient discharging home with oxygen through [place].            DME Arranged: Oxygen DME Agency: [employer] Date DME Agency Contacted: 08/22/19 Time DME Agency Contacted: [phone] Representative spoke with at DME Agency:  [place]: PT [place]: [facility] (formerly [employer]) Date [place]: 08/22/19 Time [place]: [phone] Representative spoke with at [place]: [place] ([place]) Interventions     Readmission Risk Interventions No flowsheet data found.

## 2019-08-22 NOTE — Progress Notes (Signed)
Pt alert and oriented x3  Pt in good spirits pt denies any CP or SOB at this time.  Spoke to pt husband Marie Ramos  He states that he is home getting his home in order  I explained pt possibly could be discharged today

## 2019-08-22 NOTE — Progress Notes (Signed)
Pt up to brush teeth  Pt wiped herself down with CHG wipes  Pt up to chair after breakfast  Dr in to see pt possible discharge today  Will record o2/walk test

## 2019-08-22 NOTE — Progress Notes (Signed)
Pt discharged in care of her husband.  Pt went down via w/c on 1L [place].  Pt in good spirits IV right AC removed cannula intact

## 2019-08-22 NOTE — Progress Notes (Signed)
SATURATION QUALIFICATIONS: (This note is used to comply with regulatory documentation for home oxygen)  Patient Saturations on Room Air at Rest = 90%  Patient Saturations on Room Air while Ambulating = 85%  Patient Saturations on 1Liters of oxygen while Ambulating = 92%  Please briefly explain why patient needs home oxygen:  Pt has history of anxiety Pt anxiety increases with exertion.   Pt takes deep breaths while sitting and Oxygen demand is minimal  Pt requires at least 1Liter with exertion

## 2019-08-22 NOTE — Discharge Summary (Signed)
Physician Discharge Summary  Marie Ramos [id number] DOB: 1956-01-02 DOA: 08/16/2019  PCP: Lawerance Cruel, MD  Admit date: 08/16/2019 Discharge date: 08/22/2019  Admitted From: Home Disposition: Home with home health  Recommendations for Outpatient Follow-up:  1. Follow up with PCP in 1-2 weeks 2. Please obtain BMP/CBC in one week  Home Health: Yes, PT Equipment/Devices: None  Discharge Condition: Stable CODE STATUS: Full Diet recommendation: As tolerated  Brief/Interim Summary: Marie Ramos a 64 y.o.femalewith medical history significant ofbipolar disorder, CVA, TBI, anoxic brain injury,HOCM,QT prolongation, history of V. Fib cardiac arrest status post AICD, hypertension, hyperlipidemia, history of DVTpresenting to the ED for evaluation of altered mental status. History limited as patient appears confused. States she has bipolar disorder but her husband is concerned that for the past 2 days she has not been acting right. Reports having shortness of breath but is not sure when it started. No other complaints. Denies fevers, chills, chest pain, nausea, vomiting, abdominal pain, or diarrhea. No additional history could be obtained from the patient. In [id number] saturation 82% on room air, improved with 3 L supplemental oxygen. Chest x-ray showing patchy opacities in the periphery of the right mid upper lung suspicious for pneumonia/atypical viral infection. Also showing cardiomegaly with vascular congestion. Patient received Tylenol, Decadron, and remdesivir.  Patient admitted as above with acute hypoxic respiratory failure in the setting of COVID-19 pneumonia.  Patient also had marked mental status changes likely acute metabolic encephalopathy again in the setting of COVID-19 pneumonia.  At this time patient has resolved and essentially back to baseline mental status, family concurs.  Patient's respiratory status has markedly improved, patient is able to remain on  room air at rest without hypoxic events, with ambulation and exertion patient does have minimal hypoxia requiring 1 to 2 L nasal cannula to maintain appropriate saturations.  At this time patient will be discharged home with oxygen and home health to continue physical therapy supplemental oxygen until such time as they can be removed safely.  Patient will follow up with PCP in the next 3 to 5 days to ensure safe resolution and continue to quarantine for 21 days after nasal swab into March as discussed.  Patient's husband at home is available to assist with care as well as home health as outlined above.  Patient otherwise stable and agreeable for discharge home.  No medication changes during hospitalization other than continued steroid taper at discharge.   Discharge Diagnoses:  Principal Problem:   Pneumonia due to COVID-19 virus Active Problems:   Acute respiratory failure ([place])   Essential hypertension   Pulmonary vascular congestion   Acute metabolic encephalopathy    Discharge Instructions  Discharge Instructions    Call MD for:  difficulty breathing, headache or visual disturbances   Complete by: As directed    Call MD for:  extreme fatigue   Complete by: As directed    Call MD for:  hives   Complete by: As directed    Call MD for:  persistant dizziness or light-headedness   Complete by: As directed    Call MD for:  persistant nausea and vomiting   Complete by: As directed    Call MD for:  severe uncontrolled pain   Complete by: As directed    Call MD for:  temperature >100.4   Complete by: As directed    Diet - low sodium heart healthy   Complete by: As directed    Increase activity slowly   Complete by: As  directed      Allergies as of 08/22/2019      Reactions   Sulfa Antibiotics Itching   Face neck red   Naproxen Itching   Vicodin [hydrocodone-acetaminophen] Itching      Medication List    STOP taking these medications   oseltamivir 75 MG capsule Commonly  known as: TAMIFLU     TAKE these medications   ALPRAZolam 0.25 MG tablet Commonly known as: XANAX TAKE ONE TABLET BY MOUTH AT BEDTIME AS NEEDED for sleep or anxiety What changed: See the new instructions.   amLODipine 5 MG tablet Commonly known as: NORVASC Take 1 tablet (5 mg total) by mouth daily with breakfast.   aspirin EC 81 MG tablet Take 81 mg by mouth daily.   atorvastatin 20 MG tablet Commonly known as: LIPITOR Take 1 tablet (20 mg total) by mouth daily at 6 PM.   carvedilol 12.5 MG tablet Commonly known as: COREG TAKE ONE TABLET BY MOUTH TWICE DAILY WITH BREAKFAST AND DINNER What changed:   how much to take  how to take this  when to take this  additional instructions   famotidine 20 MG tablet Commonly known as: PEPCID Take 20 mg by mouth at bedtime.   fexofenadine 180 MG tablet Commonly known as: ALLEGRA Take 180 mg by mouth daily.   fluticasone 50 MCG/ACT nasal spray Commonly known as: FLONASE Place 1 spray into both nostrils daily. Use as directed   furosemide 20 MG tablet Commonly known as: LASIX TAKE ONE TABLET BY MOUTH ONE TIME DAILY   losartan 100 MG tablet Commonly known as: COZAAR Take 1 tablet (100 mg total) by mouth daily.   multivitamin Tabs tablet Take 1 tablet by mouth daily.   multivitamin with minerals Tabs tablet Take 1 tablet by mouth daily.   Oxcarbazepine 300 MG tablet Commonly known as: Trileptal Take 1.5 tablets (450 mg total) by mouth 2 (two) times daily.   predniSONE 10 MG tablet Commonly known as: DELTASONE Take 4 tablets (40 mg total) by mouth daily for 3 days, THEN 3 tablets (30 mg total) daily for 3 days, THEN 2 tablets (20 mg total) daily for 3 days, THEN 1 tablet (10 mg total) daily for 3 days. Start taking on: August 22, 2019   SKYRIZI (150 MG DOSE) Fortescue Inject into the skin every 3 (three) months.   traMADol 50 MG tablet Commonly known as: ULTRAM Take 50 mg by mouth daily as needed for moderate pain.    triamcinolone ointment 0.5 % Commonly known as: KENALOG Apply 1 application topically 2 (two) times daily.   venlafaxine XR 75 MG 24 hr capsule Commonly known as: EFFEXOR-XR TAKE ONE CAPSULE BY MOUTH ONE TIME DAILY What changed: when to take this   vitamin E 28000 units external oil Apply topically daily.            Durable Medical Equipment  (From admission, onward)         Start     Ordered   08/22/19 1645  DME Oxygen  Once    Question Answer Comment  Length of Need 6 Months   Mode or (Route) Nasal cannula   Liters per Minute 2   Frequency Continuous (stationary and portable oxygen unit needed)   Oxygen delivery system Gas      08/22/19 1644          Allergies  Allergen Reactions  . Sulfa Antibiotics Itching    Face neck red  . Naproxen Itching  .  Vicodin [Hydrocodone-Acetaminophen] Itching    Consultations:  None   Procedures/Studies: CT HEAD WO CONTRAST  Result Date: 08/16/2019 CLINICAL DATA:  Altered mental status EXAM: CT HEAD WITHOUT CONTRAST TECHNIQUE: Contiguous axial images were obtained from the base of the skull through the vertex without intravenous contrast. COMPARISON:  08/24/2015 FINDINGS: Brain: No acute intracranial abnormality. Specifically, no hemorrhage, hydrocephalus, mass lesion, acute infarction, or significant intracranial injury. Vascular: No hyperdense vessel or unexpected calcification. Skull: No acute calvarial abnormality. Sinuses/Orbits: Visualized paranasal sinuses and mastoids clear. Orbital soft tissues unremarkable. Other: None IMPRESSION: No intracranial abnormality. Electronically Signed   By: Rolm Baptise M.D.   On: 08/16/2019 21:38   DG Chest Port 1 View  Result Date: 08/16/2019 CLINICAL DATA:  Hypoxia. Flu positive. Decreased appetite and loss of smell. EXAM: PORTABLE CHEST 1 VIEW COMPARISON:  Radiograph 09/04/2015 FINDINGS: Dual lead left-sided pacemaker in place. Mild cardiomegaly is similar to prior. Unchanged  mediastinal contours. Vascular congestion. Suggestion of patchy opacities in the periphery of the right mid upper lung zone. No large pleural effusion. No pneumothorax. Detailed evaluation is partially obscured by soft tissue attenuation from habitus. IMPRESSION: 1. Patchy opacities in the periphery of the right mid upper lung zone, suspicious for pneumonia, including atypical viral infection. 2. Cardiomegaly with vascular congestion. Electronically Signed   By: Keith Rake M.D.   On: 08/16/2019 16:37   ECHOCARDIOGRAM COMPLETE  Result Date: 08/17/2019    ECHOCARDIOGRAM REPORT   Patient Name:   Marie Ramos      Date of Exam: 08/17/2019 Medical Rec #:  [id number]              Height:       62.0 in Accession #:    [id number]             Weight:       260.0 lb Date of Birth:  Jul 04, 1956              BSA:          2.14 m Patient Age:    64 years               BP:           146/86 mmHg Patient Gender: F                      HR:           82 bpm. Exam Location:  Inpatient [facility] Procedure: 2D Echo Indications:    CHF  History:        Patient has prior history of Echocardiogram examinations, most                 recent 07/14/2016. COVID-19 Positive.  Sonographer:    Mikki Santee RDCS (AE) Referring Phys: [id number] [place]  1. Left ventricular ejection fraction, by estimation, is 65 to 70%. The left ventricle has normal function. There is moderately increased left ventricular hypertrophy of the basal septum. LV diastolic function not assessed.  2. Right ventricular systolic function was not well visualized. The right ventricular size is not well visualized. Tricuspid regurgitation signal is inadequate for assessing PA pressure.  3. The mitral valve is normal in structure and function. no evidence of mitral valve regurgitation. No evidence of mitral stenosis.  4. The aortic valve was not well visualized. Aortic valve regurgitation is trivial.  5. The inferior vena cava is normal in  size with <50% respiratory variability, suggesting  right atrial pressure of 8 mmHg.  6. No left atrial/left atrial appendage thrombus was detected. Conclusion(s)/Recomendation(s): Poor windows for evaluation of left ventricular function regional wall motion by transthoracic echocardiography. Would recommend limited study with definity if clinically indicated. FINDINGS  Left Ventricle: Left ventricular ejection fraction, by estimation, is 60 to 65%. The left ventricle has normal function. The left ventricle is not well visualized. The left ventricular internal cavity size was normal in size. There is moderately increased. There is moderately increased left ventricular hypertrophy of the basal-septal segment. Left ventricular diastolic function could not be evaluated. Right Ventricle: The right ventricular size is not well visualized. Right vetricular wall thickness was not assessed. Right ventricular systolic function was not well visualized. Tricuspid regurgitation signal is inadequate for assessing PA pressure. Left Atrium: Left atrial size was not well visualized. Right Atrium: Right atrial size was not well visualized. Pericardium: There is no evidence of pericardial effusion. Mitral Valve: The mitral valve is normal in structure and function. Normal mobility of the mitral valve leaflets. Mild mitral annular calcification. No evidence of mitral valve regurgitation. No evidence of mitral valve stenosis. Tricuspid Valve: The tricuspid valve is normal in structure. Tricuspid valve regurgitation is not demonstrated. No evidence of tricuspid stenosis. Aortic Valve: The aortic valve was not well visualized. Aortic valve regurgitation is trivial. Pulmonic Valve: The pulmonic valve was normal in structure. Pulmonic valve regurgitation is not visualized. No evidence of pulmonic stenosis. Aorta: The aortic root is normal in size and structure. Venous: The inferior vena cava is normal in size with less than 50% respiratory  variability, suggesting right atrial pressure of 8 mmHg. The inferior vena cava and the hepatic vein show a normal flow pattern. IAS/Shunts: No atrial level shunt detected by color flow Doppler. Additional Comments: A pacer wire is visualized.  LEFT VENTRICLE PLAX 2D LVIDd:         3.79 cm  Diastology LVIDs:         2.47 cm  LV e' lateral: 5.11 cm/s LV PW:         1.23 cm  LV e' medial:  4.68 cm/s LV IVS:        1.34 cm LVOT diam:     2.30 cm LV SV:         68.55 ml LV SV Index:   17.02 LVOT Area:     4.15 cm  LEFT ATRIUM             Index       RIGHT ATRIUM           Index LA diam:        3.00 cm 1.40 cm/m  RA Area:     17.60 cm LA Vol (A2C):   52.0 ml 24.33 ml/m RA Volume:   40.00 ml  18.72 ml/m LA Vol (A4C):   53.0 ml 24.80 ml/m LA Biplane Vol: 52.5 ml 24.56 ml/m  AORTIC VALVE LVOT Vmax:   71.80 cm/s LVOT Vmean:  43.100 cm/s LVOT VTI:    0.165 m  AORTA Ao Root diam: 3.00 cm  SHUNTS Systemic VTI:  0.16 m Systemic Diam: 2.30 cm Fransico Him MD Electronically signed by Fransico Him MD Signature Date/Time: 08/17/2019/10:21:51 AM    Final     Subjective: No acute issues or events overnight, fatigue and weakness markedly improved, no longer hypoxic at rest on room air, able to ambulate on 1 to 2 L nasal cannula with moderate exertion and without hypoxia.  Denies chest  pain, nausea, vomiting, diarrhea, constipation, headache, fevers, chills.   Discharge Exam: Vitals:   08/22/19 1200 08/22/19 1600  BP:  (!) 147/77  Pulse:    Resp: 18 20  Temp: 97.8 F (36.6 C) 97.9 F (36.6 C)  SpO2:     Vitals:   08/22/19 1133 08/22/19 1135 08/22/19 1200 08/22/19 1600  BP:    (!) 147/77  Pulse:      Resp:   18 20  Temp:   97.8 F (36.6 C) 97.9 F (36.6 C)  TempSrc:   Oral Oral  SpO2: (!) 84% 91%    Weight:      Height:        General:  Pleasantly resting in bed, No acute distress. HEENT:  Normocephalic atraumatic.  Sclerae nonicteric, noninjected.  Extraocular movements intact bilaterally. Neck:   Without mass or deformity.  Trachea is midline. Lungs:  Clear to auscultate bilaterally without rhonchi, wheeze, or rales. Heart:  Regular rate and rhythm.  Without murmurs, rubs, or gallops. Abdomen:  Soft, nontender, nondistended.  Without guarding or rebound. Extremities: Without cyanosis, clubbing, edema, or obvious deformity. Vascular:  Dorsalis pedis and posterior tibial pulses palpable bilaterally. Skin:  Warm and dry, no erythema, no ulcerations.   The results of significant diagnostics from this hospitalization (including imaging, microbiology, ancillary and laboratory) are listed below for reference.     Microbiology: Recent Results (from the past 240 hour(s))  Respiratory Panel by RT PCR (Flu A&B, Covid) - Nasopharyngeal Swab     Status: Abnormal   Collection Time: 08/16/19  4:21 PM   Specimen: Nasopharyngeal Swab  Result Value Ref Range Status   SARS Coronavirus 2 by RT PCR POSITIVE (A) NEGATIVE Final    Comment: RESULT CALLED TO, READ BACK BY AND VERIFIED WITH: OXENDINE,J. RN @1917  ON 02.09.2021 BY COHEN,K (NOTE) SARS-CoV-2 target nucleic acids are DETECTED. SARS-CoV-2 RNA is generally detectable in upper respiratory specimens  during the acute phase of infection. Positive results are indicative of the presence of the identified virus, but do not rule out bacterial infection or co-infection with other pathogens not detected by the test. Clinical correlation with patient history and other diagnostic information is necessary to determine patient infection status. The expected result is Negative. Fact Sheet for Patients:  PinkCheek.be Fact Sheet for Healthcare Providers: GravelBags.it This test is not yet approved or cleared by the [place] FDA and  has been authorized for detection and/or diagnosis of SARS-CoV-2 by FDA under an Emergency Use Authorization (EUA).  This EUA will remain in effect (meaning this  test can  be used) for the duration of  the COVID-19 declaration under Section 564(b)(1) of the Act, 21 U.S.C. section 360bbb-3(b)(1), unless the authorization is terminated or revoked sooner.    Influenza A by PCR NEGATIVE NEGATIVE Final   Influenza B by PCR NEGATIVE NEGATIVE Final    Comment: (NOTE) The Xpert Xpress SARS-CoV-2/FLU/RSV assay is intended as an aid in  the diagnosis of influenza from Nasopharyngeal swab specimens and  should not be used as a sole basis for treatment. Nasal washings and  aspirates are unacceptable for Xpert Xpress SARS-CoV-2/FLU/RSV  testing. Fact Sheet for Patients: PinkCheek.be Fact Sheet for Healthcare Providers: GravelBags.it This test is not yet approved or cleared by the [place] FDA and  has been authorized for detection and/or diagnosis of SARS-CoV-2 by  FDA under an Emergency Use Authorization (EUA). This EUA will remain  in effect (meaning this test can be used) for the duration of  the  Covid-19 declaration under Section 564(b)(1) of the Act, 21  U.S.C. section 360bbb-3(b)(1), unless the authorization is  terminated or revoked. Performed at [facility], [place] [address] [place], [place] [address]   Blood Culture (routine x 2)     Status: None   Collection Time: 08/16/19  4:50 PM   Specimen: BLOOD  Result Value Ref Range Status   Specimen Description   Final    BLOOD RIGHT ANTECUBITAL Performed at [facility] [address], [place], [place] [address]    Special Requests   Final    BOTTLES DRAWN AEROBIC AND ANAEROBIC Blood Culture adequate volume Performed at [facility] [address], [place], [place] [address]    Culture   Final    NO GROWTH 5 DAYS Performed at [facility], [place] [address] [place], [place] [address]    Report Status 08/21/2019 FINAL  Final  Blood Culture (routine x 2)     Status: None    Collection Time: 08/16/19  4:54 PM   Specimen: BLOOD  Result Value Ref Range Status   Specimen Description   Final    BLOOD LEFT WRIST Performed at [facility] [address] [place], [place] [address]    Special Requests   Final    BOTTLES DRAWN AEROBIC AND ANAEROBIC Blood Culture adequate volume Performed at [facility] [address], [place], [place] [address]    Culture   Final    NO GROWTH 5 DAYS Performed at [facility], [place] [address], [place], [place] [address]    Report Status 08/21/2019 FINAL  Final     Labs: BNP (last 3 results) Recent Labs    08/16/19 1654  BNP [id number]*   Basic Metabolic Panel: Recent Labs  Lab 08/17/19 0500 08/18/19 0315 08/19/19 0220 08/20/19 0115 08/21/19 0115  NA 145 140 139 141 140  K 4.2 3.9 4.4 4.7 5.0  CL 101 98 98 99 98  CO2 33* 32 32 30 33*  GLUCOSE 128* 96 100* 92 112*  BUN 23 37* 41* 43* 40*  CREATININE 1.18* 1.27* 1.20* 0.80 0.91  CALCIUM 8.8* 9.0 9.2 9.7 9.6   Liver Function Tests: Recent Labs  Lab 08/17/19 0500 08/18/19 0315 08/19/19 0220 08/20/19 0115 08/21/19 0115  AST 22 19 21  34 31  ALT 22 22 24  43 48*  ALKPHOS 123 108 103 98 97  BILITOT 0.4 0.5 0.8 0.2* 0.6  PROT 7.4 6.8 6.6 6.4* 6.8  ALBUMIN 3.3* 3.0* 2.9* 3.0* 3.1*   No results for input(s): LIPASE, AMYLASE in the last 168 hours. Recent Labs  Lab 08/16/19 2009  AMMONIA 25   CBC: Recent Labs  Lab 08/17/19 0500 08/18/19 0315 08/19/19 0220 08/20/19 0115 08/21/19 0115  WBC 5.7 8.6 10.4 9.8 9.3  NEUTROABS 5.1 6.4 8.1* 7.7 7.6  HGB 13.1 11.7* 11.6* 11.6* 11.5*  HCT 43.6 38.9 38.4 38.6 37.7  MCV 96.0 95.6 94.8 95.3 93.1  PLT 619* 627* 615* 625* 623*   CBG: Recent Labs  Lab 08/18/19 2004  GLUCAP 143*   D-Dimer Recent Labs    08/20/19 0115 08/21/19 0115  DDIMER 3.48* 3.46*   Urinalysis    Component Value Date/Time   COLORURINE YELLOW 09/01/2015 1754   APPEARANCEUR CLOUDY (A)  09/01/2015 1754   LABSPEC 1.015 09/01/2015 1754   PHURINE 6.0 09/01/2015 1754   GLUCOSEU NEGATIVE 09/01/2015 1754   HGBUR SMALL (A) 09/01/2015 1754  BILIRUBINUR NEGATIVE 09/01/2015 1754   KETONESUR NEGATIVE 09/01/2015 1754   PROTEINUR NEGATIVE 09/01/2015 1754   NITRITE NEGATIVE 09/01/2015 1754   LEUKOCYTESUR TRACE (A) 09/01/2015 1754   Sepsis Labs Invalid input(s): PROCALCITONIN,  WBC,  LACTICIDVEN Microbiology Recent Results (from the past 240 hour(s))  Respiratory Panel by RT PCR (Flu A&B, Covid) - Nasopharyngeal Swab     Status: Abnormal   Collection Time: 08/16/19  4:21 PM   Specimen: Nasopharyngeal Swab  Result Value Ref Range Status   SARS Coronavirus 2 by RT PCR POSITIVE (A) NEGATIVE Final    Comment: RESULT CALLED TO, READ BACK BY AND VERIFIED WITH: OXENDINE,J. RN @1917  ON 02.09.2021 BY COHEN,K (NOTE) SARS-CoV-2 target nucleic acids are DETECTED. SARS-CoV-2 RNA is generally detectable in upper respiratory specimens  during the acute phase of infection. Positive results are indicative of the presence of the identified virus, but do not rule out bacterial infection or co-infection with other pathogens not detected by the test. Clinical correlation with patient history and other diagnostic information is necessary to determine patient infection status. The expected result is Negative. Fact Sheet for Patients:  PinkCheek.be Fact Sheet for Healthcare Providers: GravelBags.it This test is not yet approved or cleared by the [place] FDA and  has been authorized for detection and/or diagnosis of SARS-CoV-2 by FDA under an Emergency Use Authorization (EUA).  This EUA will remain in effect (meaning this test can  be used) for the duration of  the COVID-19 declaration under Section 564(b)(1) of the Act, 21 U.S.C. section 360bbb-3(b)(1), unless the authorization is terminated or revoked sooner.    Influenza A by PCR  NEGATIVE NEGATIVE Final   Influenza B by PCR NEGATIVE NEGATIVE Final    Comment: (NOTE) The Xpert Xpress SARS-CoV-2/FLU/RSV assay is intended as an aid in  the diagnosis of influenza from Nasopharyngeal swab specimens and  should not be used as a sole basis for treatment. Nasal washings and  aspirates are unacceptable for Xpert Xpress SARS-CoV-2/FLU/RSV  testing. Fact Sheet for Patients: PinkCheek.be Fact Sheet for Healthcare Providers: GravelBags.it This test is not yet approved or cleared by the [place] FDA and  has been authorized for detection and/or diagnosis of SARS-CoV-2 by  FDA under an Emergency Use Authorization (EUA). This EUA will remain  in effect (meaning this test can be used) for the duration of the  Covid-19 declaration under Section 564(b)(1) of the Act, 21  U.S.C. section 360bbb-3(b)(1), unless the authorization is  terminated or revoked. Performed at [facility], [place] [address], [place], [place] [address]   Blood Culture (routine x 2)     Status: None   Collection Time: 08/16/19  4:50 PM   Specimen: BLOOD  Result Value Ref Range Status   Specimen Description   Final    BLOOD RIGHT ANTECUBITAL Performed at [facility] [address], [place], [place] [address]    Special Requests   Final    BOTTLES DRAWN AEROBIC AND ANAEROBIC Blood Culture adequate volume Performed at [facility] [address], [place], [place] [address]    Culture   Final    NO GROWTH 5 DAYS Performed at [facility], [place] [address] [place],  [address]    Report Status 08/21/2019 FINAL  Final  Blood Culture (routine x 2)     Status: None   Collection Time: 08/16/19  4:54 PM   Specimen: BLOOD  Result Value Ref Range Status   Specimen Description  Final    BLOOD LEFT WRIST Performed at [facility] [address]  [place], [place] [address]    Special Requests   Final    BOTTLES DRAWN AEROBIC AND ANAEROBIC Blood Culture adequate volume Performed at [facility] [address] [place], [place] [address]    Culture   Final    NO GROWTH 5 DAYS Performed at [facility], [place] [address], [place], [place] [address]    Report Status 08/21/2019 FINAL  Final   Time coordinating discharge: Over 30 minutes  SIGNED:  Little Ishikawa, DO [facility] 08/22/2019, 4:45 PM Pager   If 7PM-7AM, please contact night-coverage [email or web address]

## 2019-08-23 ENCOUNTER — Telehealth: Payer: Self-pay | Admitting: Physician Assistant

## 2019-08-23 ENCOUNTER — Other Ambulatory Visit: Payer: Self-pay

## 2019-08-23 MED ORDER — VENLAFAXINE HCL ER 75 MG PO CP24: 75.0000 mg | ORAL_CAPSULE | Freq: Every day | ORAL | 5 refills | Status: DC

## 2019-08-23 MED ORDER — VENLAFAXINE HCL ER 75 MG PO CP24: 150.0000 mg | ORAL_CAPSULE | Freq: Every day | ORAL | 5 refills | Status: DC

## 2019-08-23 NOTE — Telephone Encounter (Signed)
Pt would like a refill on Effexor 75mg . Pt has been in Hospital and they changed her dose. She wants to know how much she should be taking. Please send to [facility] on [address].

## 2019-08-23 NOTE — Telephone Encounter (Signed)
2 qd

## 2019-08-23 NOTE — Telephone Encounter (Signed)
Updated Rx for 2/day Effexor 75 mg submitted to [facility]

## 2019-08-24 NOTE — Telephone Encounter (Signed)
Rtc to both patient and husband, advised them to increase Effexor 75 mg to 2 daily. Transferred to set up apt, husband feels like she needs to be seen in person but due to her Covid/Flu/Pneumonia it will be a few weeks out. Patient is feeling depressed but advised her the increase in Effexor should start helping soon. Informed them to call back with worsening symptoms.

## 2019-08-29 ENCOUNTER — Ambulatory Visit (INDEPENDENT_AMBULATORY_CARE_PROVIDER_SITE_OTHER): Payer: Medicare HMO

## 2019-08-29 DIAGNOSIS — I5032 Chronic diastolic (congestive) heart failure: Secondary | ICD-10-CM | POA: Diagnosis not present

## 2019-08-29 DIAGNOSIS — Z95810 Presence of automatic (implantable) cardiac defibrillator: Secondary | ICD-10-CM | POA: Diagnosis not present

## 2019-08-31 NOTE — Progress Notes (Signed)
EPIC Encounter for ICM Monitoring  Patient Name: Marie Ramos is a 64 y.o. female Date: 08/31/2019 Primary Care Physican: Lawerance Cruel, MD Primary Cardiologist: Allred Electrophysiologist: Allred 07/25/2019 Weight:248-249lbs  Spoke with patient and she is recovering from [place] virus and was hospitalized from  2/9 - 2/15.          Optivol thoracic impedance normal.  Prescribed:Furosemide 20 mg 1 tablet daily  Labs: 08/21/2019 Creatinine 0.91, BUN 40, Potassium 5.0, Sodium 140, GFR >60 08/20/2019 Creatinine 0.80, BUN 43, Potassium 4.7, Sodium 141, GFR >60  08/19/2019 Creatinine 1.20, BUN 41, Potassium 4.4, Sodium 139, GFR 48-56  08/18/2019 Creatinine 1.27  BUN 37, Potassium 3.9, Sodium 140, GFR 45-52  08/17/2019 Creatinine 1.18, BUN 23, Potassium 4.2, Sodium 145, GFR 49-57 08/16/2019 Creatinine 1.19, BUN 23, Potassium 4.1, Sodium 144, GFR 49-56  A complete set of results can be found in Results Review.  Recommendations: Reinforced limiting salt intake to < 2000 mg daily and fluid intake to 64 oz daily.  Encouraged to call if experiencing fluid symptoms.  Follow-up plan: ICM clinic phone appointment on 10/03/2019.   91 day device clinic remote transmission 11/17/2019.     Copy of ICM check sent to Dr. Rayann Heman  3 month ICM trend: 08/29/2019    1 Year ICM trend:       [name], RN 08/31/2019 3:56 PM

## 2019-09-05 DIAGNOSIS — J1282 Pneumonia due to coronavirus disease 2019: Secondary | ICD-10-CM | POA: Diagnosis not present

## 2019-09-05 DIAGNOSIS — U071 COVID-19: Secondary | ICD-10-CM | POA: Diagnosis not present

## 2019-09-05 DIAGNOSIS — Z09 Encounter for follow-up examination after completed treatment for conditions other than malignant neoplasm: Secondary | ICD-10-CM | POA: Diagnosis not present

## 2019-09-14 ENCOUNTER — Ambulatory Visit: Payer: Medicare HMO | Admitting: Physician Assistant

## 2019-09-20 ENCOUNTER — Other Ambulatory Visit: Payer: Self-pay

## 2019-09-22 DIAGNOSIS — H353132 Nonexudative age-related macular degeneration, bilateral, intermediate dry stage: Secondary | ICD-10-CM | POA: Diagnosis not present

## 2019-09-22 DIAGNOSIS — H3563 Retinal hemorrhage, bilateral: Secondary | ICD-10-CM | POA: Diagnosis not present

## 2019-09-23 ENCOUNTER — Other Ambulatory Visit: Payer: Self-pay

## 2019-09-23 ENCOUNTER — Encounter: Payer: Self-pay | Admitting: Physician Assistant

## 2019-09-23 ENCOUNTER — Ambulatory Visit (INDEPENDENT_AMBULATORY_CARE_PROVIDER_SITE_OTHER): Payer: Medicare HMO | Admitting: Physician Assistant

## 2019-09-23 DIAGNOSIS — F411 Generalized anxiety disorder: Secondary | ICD-10-CM

## 2019-09-23 DIAGNOSIS — F319 Bipolar disorder, unspecified: Secondary | ICD-10-CM

## 2019-09-23 NOTE — Progress Notes (Signed)
[facility] Check  Patient ID: Marie Ramos,  MRN: [id number]  PCP: Lawerance Cruel, MD  Date of Evaluation: 09/23/2019 Time spent:20 minutes  Chief Complaint:  Chief Complaint    Anxiety; Depression      HISTORY/CURRENT STATUS: HPI For routine med check.  Since LOV, she was in [place] for [place].  She has recovered from that but is still very tired.   We increased the Effexor XR about that same time. Hard to tell how much it's helping b/c she's still recovering. C/o daytime fatigue and when she was on Effexor in the past it made her sleepy then too.  Takes it in the morning. Watches tv all the time. No SI/HI.   Anxiety is still a problem but the Xanax does help. No decreased need for sleep. Sleeps okay but does get up 1-2 times per night to go to [place] or either b/c her husband has to go to the [place]. No impulsivity, risky behavior, no increased irritability or spending.   Patient denies increased energy with decreased need for sleep, no increased talkativeness, no racing thoughts, no impulsivity or risky behaviors, no increased spending, no increased libido, no grandiosity.  Denies dizziness, syncope, seizures, numbness, tingling, tremor, tics, unsteady gait, slurred speech, confusion. Denies muscle or joint pain, stiffness, or dystonia.  Individual Medical History/ Review of Systems: Changes? :Yes   Was in hosp for COVID pneumonia in Feb for 4 days.   Past medications for mental health diagnoses include: Lithium, Trileptal, Effexor XR,  Allergies: Sulfa antibiotics, Naproxen, and Vicodin [hydrocodone-acetaminophen]  Current Medications:  Current Outpatient Medications:  .  ALPRAZolam (XANAX) 0.25 MG tablet, TAKE ONE TABLET BY MOUTH AT BEDTIME AS NEEDED for sleep or anxiety (Patient taking differently: Take 0.25 mg by mouth at bedtime as needed for anxiety or sleep. ), Disp: 30 tablet, Rfl: 0 .  amLODipine (NORVASC) 5 MG tablet, Take 1 tablet (5 mg total) by mouth daily  with breakfast., Disp: 30 tablet, Rfl: 0 .  aspirin EC 81 MG tablet, Take 81 mg by mouth daily., Disp: , Rfl:  .  atorvastatin (LIPITOR) 20 MG tablet, Take 1 tablet (20 mg total) by mouth daily at 6 PM., Disp: 30 tablet, Rfl: 0 .  carvedilol (COREG) 12.5 MG tablet, TAKE ONE TABLET BY MOUTH TWICE DAILY WITH BREAKFAST AND DINNER (Patient taking differently: Take 12.5 mg by mouth 2 (two) times daily with a meal. BREAKFAST AND DINNER), Disp: 180 tablet, Rfl: 2 .  famotidine (PEPCID) 20 MG tablet, Take 20 mg by mouth at bedtime., Disp: , Rfl:  .  fexofenadine (ALLEGRA) 180 MG tablet, Take 180 mg by mouth daily., Disp: , Rfl:  .  fluticasone (FLONASE) 50 MCG/ACT nasal spray, Place 1 spray into both nostrils daily. Use as directed, Disp: , Rfl:  .  furosemide (LASIX) 20 MG tablet, TAKE ONE TABLET BY MOUTH ONE TIME DAILY  (Patient taking differently: Take 20 mg by mouth daily. ), Disp: 90 tablet, Rfl: 2 .  losartan (COZAAR) 100 MG tablet, Take 1 tablet (100 mg total) by mouth daily., Disp: 90 tablet, Rfl: 3 .  Multiple Vitamin (MULTIVITAMIN WITH MINERALS) TABS tablet, Take 1 tablet by mouth daily., Disp: , Rfl:  .  multivitamin (PROSIGHT) TABS tablet, Take 1 tablet by mouth daily., Disp: 30 each, Rfl: 0 .  Oxcarbazepine (TRILEPTAL) 300 MG tablet, Take 1.5 tablets (450 mg total) by mouth 2 (two) times daily., Disp: 270 tablet, Rfl: 1 .  Risankizumab-rzaa (SKYRIZI, 150 MG DOSE,  [place]), Inject into the skin every 3 (three) months., Disp: , Rfl:  .  triamcinolone ointment (KENALOG) 0.5 %, Apply 1 application topically 2 (two) times daily. , Disp: , Rfl: 0 .  venlafaxine XR (EFFEXOR-XR) 75 MG 24 hr capsule, Take 2 capsules (150 mg total) by mouth daily with breakfast., Disp: 60 capsule, Rfl: 5 .  traMADol (ULTRAM) 50 MG tablet, Take 50 mg by mouth daily as needed for moderate pain. , Disp: , Rfl: 0 .  vitamin E 28000 units external oil, Apply topically daily., Disp: , Rfl:  Medication Side Effects: fatigue/weakness  and hypersomnolence  Family Medical/ Social History: Changes? No  MENTAL HEALTH EXAM:  There were no vitals taken for this visit.There is no height or weight on file to calculate BMI.  General Appearance: Casual, Neat, Well Groomed and Obese  Eye Contact:  Good  Speech:  Clear and Coherent and Normal Rate  Volume:  Normal  Mood:  Euthymic  Affect:  Appropriate  Thought Process:  Goal Directed and Descriptions of Associations: Intact  Orientation:  Full (Time, Place, and Person)  Thought Content: Logical   Suicidal Thoughts:  No  Homicidal Thoughts:  No  Memory:  WNL  Judgement:  Good  Insight:  Good  Psychomotor Activity:  Normal  Concentration:  Concentration: Good  Recall:  Good  Fund of Knowledge: Good  Language: Good  Assets:  Desire for Improvement  ADL's:  Intact  Cognition: WNL  Prognosis:  Good    DIAGNOSES:    ICD-10-CM   1. Bipolar I disorder ([place])  F31.9   2. Generalized anxiety disorder  F41.1     Receiving Psychotherapy: No    RECOMMENDATIONS:  PDMP reviewed. I spent 20 mins w/ her. Suggest that she take the Effexor in the evening since it is causing drowsiness.  That may or may not help but we should try it before we make any other changes. I also tried to reassure her that some of the fatigue that she feels is due to the recent hospitalization.  For some people, it takes months to get over [place] and especially since she was hospitalized with pneumonia that will take even longer to recuperate. Continue Effexor XR 75 mg, 2 p.o. nightly. Continue Trileptal 300 mg, 1.5 pills p.o. twice daily. Continue Xanax 0.25 mg, 1 p.o. nightly as needed sleep or anxiety. Return in 4 weeks.  Donnal Moat, PA-C

## 2019-09-23 NOTE — Patient Instructions (Signed)
On the Effexor XR 75 mg, take 1 pill every evening which is Friday, 09/23/2019 and then start taking 2 pills every evening.

## 2019-09-26 ENCOUNTER — Ambulatory Visit (INDEPENDENT_AMBULATORY_CARE_PROVIDER_SITE_OTHER): Payer: Medicare HMO

## 2019-09-26 ENCOUNTER — Telehealth: Payer: Self-pay | Admitting: Physician Assistant

## 2019-09-26 DIAGNOSIS — Z95810 Presence of automatic (implantable) cardiac defibrillator: Secondary | ICD-10-CM

## 2019-09-26 DIAGNOSIS — I5032 Chronic diastolic (congestive) heart failure: Secondary | ICD-10-CM

## 2019-09-26 NOTE — Telephone Encounter (Signed)
Pt would like a call from [employer] nurse or assistant per her voicemail. She stated the pharmacy didn't give her 60 pills of her Effexor as prescribed.

## 2019-09-27 ENCOUNTER — Telehealth: Payer: Self-pay | Admitting: Physician Assistant

## 2019-09-27 NOTE — Telephone Encounter (Signed)
Contacted [facility] and they did give her #60 effexor on 09/20/2019, not sure what she's referring to. Left her a message to call back. [facility] also said if she has any questions or further concerns regarding her Rx's she needs to contact them.

## 2019-09-27 NOTE — Telephone Encounter (Signed)
noted 

## 2019-09-27 NOTE — Telephone Encounter (Signed)
Will contact [facility] to see why they didn't give patient her correct dose when they open at 10 am today

## 2019-09-27 NOTE — Telephone Encounter (Signed)
Patient called back and found her Effexor

## 2019-09-27 NOTE — Telephone Encounter (Signed)
Mrs. Bry called and just wants to let Traci know she found her Effexor. Pt says she does not need a return call.

## 2019-09-30 NOTE — Progress Notes (Signed)
EPIC Encounter for ICM Monitoring  Patient Name: Marie Ramos is a 64 y.o. female Date: 09/30/2019 Primary Care Physican: Lawerance Cruel, MD Primary Cardiologist: Allred Electrophysiologist: Allred 3/26/2021Weight:250-252lbs  Spoke with patient and reports feeling well at this time.  Denies fluid symptoms.          Optivol thoracic impedance normal.  Prescribed:Furosemide 20 mg 1 tablet daily  Labs: 08/21/2019 Creatinine 0.91, BUN 40, Potassium 5.0, Sodium 140, GFR >60 08/20/2019 Creatinine 0.80, BUN 43, Potassium 4.7, Sodium 141, GFR >60  08/19/2019 Creatinine 1.20, BUN 41, Potassium 4.4, Sodium 139, GFR 48-56  08/18/2019 Creatinine 1.27  BUN 37, Potassium 3.9, Sodium 140, GFR 45-52  08/17/2019 Creatinine 1.18, BUN 23, Potassium 4.2, Sodium 145, GFR 49-57 08/16/2019 Creatinine 1.19, BUN 23, Potassium 4.1, Sodium 144, GFR 49-56  A complete set of results can be found in Results Review.  Recommendations:Reinforced limiting salt intake to <2000 mg daily and fluid intake to 64 oz daily. Encouraged to call if experiencing fluid symptoms.  Follow-up plan: ICM clinic phone appointment on4/27/2021. 91 day device clinic remote transmission 11/17/2019.   Copy of ICM check sent to Dr.Allred  3 month ICM trend: 09/26/2019    1 Year ICM trend:       [name], RN 09/30/2019 11:03 AM

## 2019-10-20 ENCOUNTER — Ambulatory Visit: Payer: Medicare HMO | Admitting: Physician Assistant

## 2019-10-20 ENCOUNTER — Other Ambulatory Visit: Payer: Self-pay | Admitting: Family Medicine

## 2019-10-20 DIAGNOSIS — Z1231 Encounter for screening mammogram for malignant neoplasm of breast: Secondary | ICD-10-CM

## 2019-10-21 ENCOUNTER — Encounter: Payer: Self-pay | Admitting: Physician Assistant

## 2019-10-21 ENCOUNTER — Other Ambulatory Visit: Payer: Self-pay

## 2019-10-21 ENCOUNTER — Ambulatory Visit (INDEPENDENT_AMBULATORY_CARE_PROVIDER_SITE_OTHER): Payer: Medicare HMO | Admitting: Physician Assistant

## 2019-10-21 DIAGNOSIS — F411 Generalized anxiety disorder: Secondary | ICD-10-CM | POA: Diagnosis not present

## 2019-10-21 DIAGNOSIS — F319 Bipolar disorder, unspecified: Secondary | ICD-10-CM | POA: Diagnosis not present

## 2019-10-21 MED ORDER — ALPRAZOLAM 0.25 MG PO TABS: 0.2500 mg | ORAL_TABLET | Freq: Every evening | ORAL | 2 refills | Status: DC | PRN

## 2019-10-21 NOTE — Progress Notes (Signed)
[facility]  Patient ID: Marie Ramos,  MRN: [id number]  PCP: Lawerance Cruel, MD  Date of Evaluation: 10/21/2019 Time spent:20 minutes  Chief Complaint:  Chief Complaint    Depression; Anxiety      HISTORY/CURRENT STATUS: HPI For routine med check.  Doing better. Is some stronger after recovering from covid in February. Is more able to prepare meals now. More talkative, and more herself. Anxiety is controlled.  Doesn't need the Xanax every night.   Patient denies loss of interest in usual activities and is able to enjoy things.  Denies decreased energy or motivation.  Appetite has not changed.  No extreme sadness, tearfulness, or feelings of hopelessness.  Denies any changes in concentration, making decisions or remembering things.  Denies suicidal or homicidal thoughts.  Patient denies increased energy with decreased need for sleep, no increased talkativeness, no racing thoughts, no impulsivity or risky behaviors, no increased spending, no increased libido, no grandiosity.  Denies dizziness, syncope, seizures, numbness, tingling, tremor, tics, unsteady gait, slurred speech, confusion. Denies muscle or joint pain, stiffness, or dystonia.  Individual Medical History/ Review of Systems: Changes? :No     Past medications for mental health diagnoses include: Lithium, Trileptal, Effexor XR,  Allergies: Sulfa antibiotics, Naproxen, and Vicodin [hydrocodone-acetaminophen]  Current Medications:  Current Outpatient Medications:  .  ALPRAZolam (XANAX) 0.25 MG tablet, Take 1 tablet (0.25 mg total) by mouth at bedtime as needed for anxiety or sleep., Disp: 30 tablet, Rfl: 2 .  amLODipine (NORVASC) 5 MG tablet, Take 1 tablet (5 mg total) by mouth daily with breakfast., Disp: 30 tablet, Rfl: 0 .  aspirin EC 81 MG tablet, Take 81 mg by mouth daily., Disp: , Rfl:  .  atorvastatin (LIPITOR) 20 MG tablet, Take 1 tablet (20 mg total) by mouth daily at 6 PM., Disp: 30 tablet,  Rfl: 0 .  carvedilol (COREG) 12.5 MG tablet, TAKE ONE TABLET BY MOUTH TWICE DAILY WITH BREAKFAST AND DINNER (Patient taking differently: Take 12.5 mg by mouth 2 (two) times daily with a meal. BREAKFAST AND DINNER), Disp: 180 tablet, Rfl: 2 .  famotidine (PEPCID) 20 MG tablet, Take 20 mg by mouth at bedtime., Disp: , Rfl:  .  fexofenadine (ALLEGRA) 180 MG tablet, Take 180 mg by mouth daily., Disp: , Rfl:  .  fluticasone (FLONASE) 50 MCG/ACT nasal spray, Place 1 spray into both nostrils daily. Use as directed, Disp: , Rfl:  .  furosemide (LASIX) 20 MG tablet, TAKE ONE TABLET BY MOUTH ONE TIME DAILY  (Patient taking differently: Take 20 mg by mouth daily. ), Disp: 90 tablet, Rfl: 2 .  losartan (COZAAR) 100 MG tablet, Take 1 tablet (100 mg total) by mouth daily., Disp: 90 tablet, Rfl: 3 .  Multiple Vitamin (MULTIVITAMIN WITH MINERALS) TABS tablet, Take 1 tablet by mouth daily., Disp: , Rfl:  .  multivitamin (PROSIGHT) TABS tablet, Take 1 tablet by mouth daily., Disp: 30 each, Rfl: 0 .  Oxcarbazepine (TRILEPTAL) 300 MG tablet, Take 1.5 tablets (450 mg total) by mouth 2 (two) times daily., Disp: 270 tablet, Rfl: 1 .  Risankizumab-rzaa (SKYRIZI, 150 MG DOSE, [place]), Inject into the skin every 3 (three) months., Disp: , Rfl:  .  triamcinolone ointment (KENALOG) 0.5 %, Apply 1 application topically 2 (two) times daily. , Disp: , Rfl: 0 .  venlafaxine XR (EFFEXOR-XR) 75 MG 24 hr capsule, Take 2 capsules (150 mg total) by mouth daily with breakfast., Disp: 60 capsule, Rfl: 5 .  traMADol (ULTRAM)  50 MG tablet, Take 50 mg by mouth daily as needed for moderate pain. , Disp: , Rfl: 0 .  vitamin E 28000 units external oil, Apply topically daily., Disp: , Rfl:  Medication Side Effects: none  Family Medical/ Social History: Changes? No  MENTAL HEALTH EXAM:  There were no vitals taken for this visit.There is no height or weight on file to calculate BMI.  General Appearance: Casual, Neat, Well Groomed and Obese  Eye  Contact:  Good  Speech:  Clear and Coherent and Normal Rate  Volume:  Normal  Mood:  Euthymic  Affect:  Appropriate  Thought Process:  Goal Directed and Descriptions of Associations: Intact  Orientation:  Full (Time, Place, and Person)  Thought Content: Logical   Suicidal Thoughts:  No  Homicidal Thoughts:  No  Memory:  WNL  Judgement:  Good  Insight:  Good  Psychomotor Activity:  Normal  Concentration:  Concentration: Good  Recall:  Good  Fund of Knowledge: Good  Language: Good  Assets:  Desire for Improvement  ADL's:  Intact  Cognition: WNL  Prognosis:  Good    DIAGNOSES:    ICD-10-CM   1. Bipolar I disorder ([place])  F31.9   2. Generalized anxiety disorder  F41.1     Receiving Psychotherapy: No    RECOMMENDATIONS:  PDMP reviewed. I spent 20 mins w/ her. She seems to be doing much better now that she is taking the Effexor in the evening.  Plus she has had more time to recover from COVID and pneumonia. Continue Effexor XR 75 mg, 2 p.o. nightly. Continue Trileptal 300 mg, 1.5 pills p.o. twice daily. Continue Xanax 0.25 mg, 1 p.o. nightly as needed sleep or anxiety. Return in 3 months.  Donnal Moat, PA-C

## 2019-10-28 DIAGNOSIS — D72829 Elevated white blood cell count, unspecified: Secondary | ICD-10-CM | POA: Diagnosis not present

## 2019-10-28 DIAGNOSIS — I10 Essential (primary) hypertension: Secondary | ICD-10-CM | POA: Diagnosis not present

## 2019-10-28 DIAGNOSIS — E7800 Pure hypercholesterolemia, unspecified: Secondary | ICD-10-CM | POA: Diagnosis not present

## 2019-11-01 ENCOUNTER — Ambulatory Visit (INDEPENDENT_AMBULATORY_CARE_PROVIDER_SITE_OTHER): Payer: Medicare HMO

## 2019-11-01 DIAGNOSIS — Z95810 Presence of automatic (implantable) cardiac defibrillator: Secondary | ICD-10-CM

## 2019-11-01 DIAGNOSIS — H6122 Impacted cerumen, left ear: Secondary | ICD-10-CM | POA: Diagnosis not present

## 2019-11-01 DIAGNOSIS — I679 Cerebrovascular disease, unspecified: Secondary | ICD-10-CM | POA: Diagnosis not present

## 2019-11-01 DIAGNOSIS — E7800 Pure hypercholesterolemia, unspecified: Secondary | ICD-10-CM | POA: Diagnosis not present

## 2019-11-01 DIAGNOSIS — I10 Essential (primary) hypertension: Secondary | ICD-10-CM | POA: Diagnosis not present

## 2019-11-01 DIAGNOSIS — I5032 Chronic diastolic (congestive) heart failure: Secondary | ICD-10-CM

## 2019-11-01 DIAGNOSIS — R7989 Other specified abnormal findings of blood chemistry: Secondary | ICD-10-CM | POA: Diagnosis not present

## 2019-11-01 DIAGNOSIS — K219 Gastro-esophageal reflux disease without esophagitis: Secondary | ICD-10-CM | POA: Diagnosis not present

## 2019-11-01 DIAGNOSIS — Z0000 Encounter for general adult medical examination without abnormal findings: Secondary | ICD-10-CM | POA: Diagnosis not present

## 2019-11-04 NOTE — Progress Notes (Signed)
EPIC Encounter for ICM Monitoring  Patient Name: Marie Ramos is a 64 y.o. female Date: 11/04/2019 Primary Care Physican: Lawerance Cruel, MD Primary Cardiologist: Allred Electrophysiologist: Allred 4/30/2021Weight:245lbs  Spoke with patient and reports feeling well at this time.  Denies fluid symptoms.  She has been eating restaurant foods which may contribute to decreased impedance.  Optivol thoracic impedance normal.  Prescribed:Furosemide 20 mg 1 tablet daily  Labs: 08/21/2019 Creatinine0.91, BUN40, Potassium5.0, Sodium140, GFR>60 08/20/2019 Creatinine0.80, BUN43, Potassium4.7, Sodium141, GFR>60  08/19/2019 Creatinine1.20, BUN41, Potassium4.4, [id number], [id number]  02/11/2021Creatinine1.27BUN 37, Potassium3.9, [id number], GFR45-52  02/10/2021Creatinine 1.18, BUN23, Potassium4.2, [id number], [id number] 02/09/2021Creatinine 1.19, BUN23, Potassium4.1, [id number], [id number] A complete set of results can be found in Results Review.  Recommendations:Reinforced limiting salt intake to <2000 mg daily and fluid intake to 64 oz daily. Encouraged to call if experiencing fluid symptoms.  Follow-up plan: ICM clinic phone appointment on6/08/2019. 91 day device clinic remote transmission5/13/2021.   Copy of ICM check sent to Dr.Allred  3 month ICM trend: 11/01/2019    1 Year ICM trend:       [name], RN 11/04/2019 11:10 AM

## 2019-11-10 ENCOUNTER — Other Ambulatory Visit: Payer: Self-pay

## 2019-11-10 ENCOUNTER — Ambulatory Visit
Admission: RE | Admit: 2019-11-10 | Discharge: 2019-11-10 | Disposition: A | Discharge status: Home / Self Care | Payer: Medicare HMO | Source: Ambulatory Visit | Attending: Family Medicine | Admitting: Family Medicine

## 2019-11-10 DIAGNOSIS — Z1231 Encounter for screening mammogram for malignant neoplasm of breast: Secondary | ICD-10-CM | POA: Diagnosis not present

## 2019-11-17 ENCOUNTER — Ambulatory Visit (INDEPENDENT_AMBULATORY_CARE_PROVIDER_SITE_OTHER): Payer: Medicare HMO | Admitting: *Deleted

## 2019-11-17 DIAGNOSIS — I6340 Cerebral infarction due to embolism of unspecified cerebral artery: Secondary | ICD-10-CM | POA: Diagnosis not present

## 2019-11-17 DIAGNOSIS — I469 Cardiac arrest, cause unspecified: Secondary | ICD-10-CM

## 2019-11-17 LAB — CUP PACEART REMOTE DEVICE CHECK
Battery Remaining Longevity: 70 mo
Battery Voltage: 2.98 V
Brady Statistic AP VP Percent: 0 %
Brady Statistic AP VS Percent: 0.22 %
Brady Statistic AS VP Percent: 0.06 %
Brady Statistic AS VS Percent: 99.71 %
Brady Statistic RA Percent Paced: 0.22 %
Brady Statistic RV Percent Paced: 0.06 %
Date Time Interrogation Session: 20210513033325
HighPow Impedance: 86 Ohm
Implantable Lead Implant Date: 20170227
Implantable Lead Implant Date: 20170227
Implantable Lead Location: 753859
Implantable Lead Location: 753860
Implantable Lead Model: 5076
Implantable Pulse Generator Implant Date: 20170227
Lead Channel Impedance Value: 399 Ohm
Lead Channel Impedance Value: 456 Ohm
Lead Channel Impedance Value: 475 Ohm
Lead Channel Pacing Threshold Amplitude: 0.375 V
Lead Channel Pacing Threshold Amplitude: 0.625 V
Lead Channel Pacing Threshold Pulse Width: 0.4 ms
Lead Channel Pacing Threshold Pulse Width: 0.4 ms
Lead Channel Sensing Intrinsic Amplitude: 23.75 mV
Lead Channel Sensing Intrinsic Amplitude: 23.75 mV
Lead Channel Sensing Intrinsic Amplitude: 3.625 mV
Lead Channel Sensing Intrinsic Amplitude: 3.625 mV
Lead Channel Setting Pacing Amplitude: 2 V
Lead Channel Setting Pacing Amplitude: 2.5 V
Lead Channel Setting Pacing Pulse Width: 0.4 ms
Lead Channel Setting Sensing Sensitivity: 0.3 mV

## 2019-11-21 NOTE — Progress Notes (Signed)
Remote ICD transmission.   

## 2019-12-01 ENCOUNTER — Ambulatory Visit: Payer: Medicare HMO | Admitting: Physician Assistant

## 2019-12-02 ENCOUNTER — Telehealth: Payer: Self-pay | Admitting: Physician Assistant

## 2019-12-02 ENCOUNTER — Other Ambulatory Visit: Payer: Self-pay | Admitting: Physician Assistant

## 2019-12-02 NOTE — Telephone Encounter (Signed)
RX sent

## 2019-12-02 NOTE — Telephone Encounter (Signed)
Patient called and said that she needs a refill on her trileptal 300 mg to be sent to [facility] on [address]. Shge has an appt in july

## 2019-12-07 ENCOUNTER — Ambulatory Visit (INDEPENDENT_AMBULATORY_CARE_PROVIDER_SITE_OTHER): Payer: Medicare HMO

## 2019-12-07 DIAGNOSIS — Z95810 Presence of automatic (implantable) cardiac defibrillator: Secondary | ICD-10-CM

## 2019-12-07 DIAGNOSIS — I5032 Chronic diastolic (congestive) heart failure: Secondary | ICD-10-CM | POA: Diagnosis not present

## 2019-12-09 NOTE — Progress Notes (Signed)
EPIC Encounter for ICM Monitoring  Patient Name: Marie Ramos is a 64 y.o. female Date: 12/09/2019 Primary Care Physican: Lawerance Cruel, MD Primary Cardiologist: Allred Electrophysiologist: Allred 4/30/2021Weight:245lbs  Spoke with patient and reports feeling well at this time. Denies fluid symptoms. She reports eating restaurant foods and understands that type of food is typically high in salt.   Optivol thoracic impedance slightly below baseline normal.  Prescribed:Furosemide 20 mg 1 tablet daily  Labs: 08/21/2019 Creatinine0.91, BUN40, Potassium5.0, Sodium140, GFR>60 08/20/2019 Creatinine0.80, BUN43, Potassium4.7, Sodium141, GFR>60  08/19/2019 Creatinine1.20, BUN41, Potassium4.4, [id number], [id number]  02/11/2021Creatinine1.27BUN 37, Potassium3.9, [id number], GFR45-52  02/10/2021Creatinine 1.18, BUN23, Potassium4.2, [id number], [id number] 02/09/2021Creatinine 1.19, BUN23, Potassium4.1, [id number], [id number] A complete set of results can be found in Results Review.  Recommendations:Reinforced limiting salt intake to <2000 mg daily and fluid intake to 64 oz daily. Encouraged to call if experiencing fluid symptoms.  Follow-up plan: ICM clinic phone appointment on7/12/2019. 91 day device clinic remote transmission8/06/2020.   Copy of ICM check sent to Dr.Allred  3 month ICM trend: 12/07/2019    1 Year ICM trend:       [name], RN 12/09/2019 1:01 PM

## 2019-12-28 DIAGNOSIS — N898 Other specified noninflammatory disorders of vagina: Secondary | ICD-10-CM | POA: Diagnosis not present

## 2020-01-04 ENCOUNTER — Ambulatory Visit: Payer: Medicare HMO | Admitting: Physician Assistant

## 2020-01-10 ENCOUNTER — Ambulatory Visit (INDEPENDENT_AMBULATORY_CARE_PROVIDER_SITE_OTHER): Payer: Medicare HMO

## 2020-01-10 DIAGNOSIS — I5032 Chronic diastolic (congestive) heart failure: Secondary | ICD-10-CM

## 2020-01-10 DIAGNOSIS — Z95810 Presence of automatic (implantable) cardiac defibrillator: Secondary | ICD-10-CM

## 2020-01-11 NOTE — Progress Notes (Signed)
EPIC Encounter for ICM Monitoring  Patient Name: Marie Ramos is a 64 y.o. female Date: 01/11/2020 Primary Care Physican: Lawerance Cruel, MD Primary Cardiologist: Allred Electrophysiologist: Allred 7/6/2021Weight:248lbs  Spoke with patient and reports feeling well at this time. Denies fluid symptoms.  Optivol thoracic impedance normal.  Prescribed:Furosemide 20 mg 1 tablet daily  Labs: 08/21/2019 Creatinine0.91, BUN40, Potassium5.0, Sodium140, GFR>60 08/20/2019 Creatinine0.80, BUN43, Potassium4.7, Sodium141, GFR>60  08/19/2019 Creatinine1.20, BUN41, Potassium4.4, [id number], [id number]  02/11/2021Creatinine1.27BUN 37, Potassium3.9, [id number], GFR45-52  02/10/2021Creatinine 1.18, BUN23, Potassium4.2, [id number], [id number] 02/09/2021Creatinine 1.19, BUN23, Potassium4.1, [id number], [id number] A complete set of results can be found in Results Review.  Recommendations: Reinforced limiting salt intake to <2000 mg daily and fluid intake to 64 oz daily. Encouraged to call if experiencing fluid symptoms.  Follow-up plan: ICM clinic phone appointment on8/03/2020. 91 day device clinic remote transmission8/06/2020.   Copy of ICM check sent to Dr.Allred  3 month ICM trend: 01/10/2020    1 Year ICM trend:       [name], RN 01/11/2020 1:49 PM

## 2020-01-13 ENCOUNTER — Ambulatory Visit: Payer: Medicare HMO | Admitting: Physician Assistant

## 2020-01-19 DIAGNOSIS — M25531 Pain in right wrist: Secondary | ICD-10-CM | POA: Diagnosis not present

## 2020-01-20 ENCOUNTER — Ambulatory Visit: Payer: Medicare HMO | Admitting: Physician Assistant

## 2020-01-25 ENCOUNTER — Ambulatory Visit (INDEPENDENT_AMBULATORY_CARE_PROVIDER_SITE_OTHER): Payer: Medicare HMO | Admitting: Physician Assistant

## 2020-01-25 ENCOUNTER — Encounter: Payer: Self-pay | Admitting: Physician Assistant

## 2020-01-25 ENCOUNTER — Other Ambulatory Visit: Payer: Self-pay

## 2020-01-25 DIAGNOSIS — F411 Generalized anxiety disorder: Secondary | ICD-10-CM | POA: Diagnosis not present

## 2020-01-25 DIAGNOSIS — F99 Mental disorder, not otherwise specified: Secondary | ICD-10-CM | POA: Diagnosis not present

## 2020-01-25 DIAGNOSIS — F319 Bipolar disorder, unspecified: Secondary | ICD-10-CM

## 2020-01-25 DIAGNOSIS — F5105 Insomnia due to other mental disorder: Secondary | ICD-10-CM | POA: Diagnosis not present

## 2020-01-25 MED ORDER — ALPRAZOLAM 0.25 MG PO TABS: 0.2500 mg | ORAL_TABLET | Freq: Two times a day (BID) | ORAL | 2 refills | Status: DC | PRN

## 2020-01-25 MED ORDER — VENLAFAXINE HCL ER 75 MG PO CP24: 150.0000 mg | ORAL_CAPSULE | Freq: Every day | ORAL | 5 refills | Status: DC

## 2020-01-25 NOTE — Progress Notes (Signed)
[facility]  Patient ID: Marie Ramos,  MRN: [id number]  PCP: Lawerance Cruel, MD  Date of Evaluation: 01/25/2020 Time spent:20 minutes  Chief Complaint:  Chief Complaint    Anxiety; Depression      HISTORY/CURRENT STATUS: HPI For routine med check.  Feels like she is doing well with current medication regimen.  She is able to enjoy things.  Energy and motivation are good.  Appetite is normal without increase or decrease in weight.  Not crying easily.  She is excited to be going to [place] next month to a wedding.  Denies suicidal or homicidal thoughts.  Patient denies increased energy with decreased need for sleep, no increased talkativeness, no racing thoughts, no impulsivity or risky behaviors, no increased spending, no increased libido, no grandiosity.  Anxiety is well controlled.  States she wouldn't be able to fall asleep if she didn't have a xanax b/c her mind races.  Occasionally has anxiety during the day but not often.  Denies dizziness, syncope, seizures, numbness, tingling, tremor, tics, unsteady gait, slurred speech, confusion. Denies muscle or joint pain, stiffness, or dystonia.  Individual Medical History/ Review of Systems: Changes? :No     Past medications for mental health diagnoses include: Lithium, Trileptal, Effexor XR,  Allergies: Sulfa antibiotics, Naproxen, and Vicodin [hydrocodone-acetaminophen]  Current Medications:  Current Outpatient Medications:  .  ALPRAZolam (XANAX) 0.25 MG tablet, Take 1 tablet (0.25 mg total) by mouth 2 (two) times daily as needed for anxiety or sleep., Disp: 60 tablet, Rfl: 2 .  amLODipine (NORVASC) 5 MG tablet, Take 1 tablet (5 mg total) by mouth daily with breakfast., Disp: 30 tablet, Rfl: 0 .  aspirin EC 81 MG tablet, Take 81 mg by mouth daily., Disp: , Rfl:  .  atorvastatin (LIPITOR) 20 MG tablet, Take 1 tablet (20 mg total) by mouth daily at 6 PM., Disp: 30 tablet, Rfl: 0 .  carvedilol (COREG) 12.5 MG  tablet, TAKE ONE TABLET BY MOUTH TWICE DAILY WITH BREAKFAST AND DINNER (Patient taking differently: Take 12.5 mg by mouth 2 (two) times daily with a meal. BREAKFAST AND DINNER), Disp: 180 tablet, Rfl: 2 .  famotidine (PEPCID) 20 MG tablet, Take 20 mg by mouth at bedtime., Disp: , Rfl:  .  fexofenadine (ALLEGRA) 180 MG tablet, Take 180 mg by mouth daily., Disp: , Rfl:  .  fluticasone (FLONASE) 50 MCG/ACT nasal spray, Place 1 spray into both nostrils daily. Use as directed, Disp: , Rfl:  .  furosemide (LASIX) 20 MG tablet, TAKE ONE TABLET BY MOUTH ONE TIME DAILY  (Patient taking differently: Take 20 mg by mouth daily. ), Disp: 90 tablet, Rfl: 2 .  losartan (COZAAR) 100 MG tablet, Take 1 tablet (100 mg total) by mouth daily., Disp: 90 tablet, Rfl: 3 .  Multiple Vitamin (MULTIVITAMIN WITH MINERALS) TABS tablet, Take 1 tablet by mouth daily., Disp: , Rfl:  .  multivitamin (PROSIGHT) TABS tablet, Take 1 tablet by mouth daily., Disp: 30 each, Rfl: 0 .  Oxcarbazepine (TRILEPTAL) 300 MG tablet, TAKE ONE AND ONE-HALF TABLETS BY MOUTH TWICE DAILY , Disp: 270 tablet, Rfl: 0 .  Risankizumab-rzaa (SKYRIZI, 150 MG DOSE, [place]), Inject into the skin every 3 (three) months., Disp: , Rfl:  .  traMADol (ULTRAM) 50 MG tablet, Take 50 mg by mouth daily as needed for moderate pain. , Disp: , Rfl: 0 .  triamcinolone ointment (KENALOG) 0.5 %, Apply 1 application topically 2 (two) times daily. , Disp: , Rfl: 0 .  venlafaxine XR (EFFEXOR-XR) 75 MG 24 hr capsule, Take 2 capsules (150 mg total) by mouth daily with breakfast., Disp: 60 capsule, Rfl: 5 .  vitamin E 28000 units external oil, Apply topically daily., Disp: , Rfl:  Medication Side Effects: none  Family Medical/ Social History: Changes? No  MENTAL HEALTH EXAM:  There were no vitals taken for this visit.There is no height or weight on file to calculate BMI.  General Appearance: Casual, Neat, Well Groomed and Obese  Eye Contact:  Good  Speech:  Clear and Coherent  and Normal Rate  Volume:  Normal  Mood:  Euthymic  Affect:  Appropriate  Thought Process:  Goal Directed and Descriptions of Associations: Intact  Orientation:  Full (Time, Place, and Person)  Thought Content: Logical   Suicidal Thoughts:  No  Homicidal Thoughts:  No  Memory:  WNL  Judgement:  Good  Insight:  Good  Psychomotor Activity:  Normal  Concentration:  Concentration: Good  Recall:  Good  Fund of Knowledge: Good  Language: Good  Assets:  Desire for Improvement  ADL's:  Intact  Cognition: WNL  Prognosis:  Good    DIAGNOSES:    ICD-10-CM   1. Bipolar I disorder ([place])  F31.9   2. Generalized anxiety disorder  F41.1   3. Insomnia due to other mental disorder  F51.05    F99     Receiving Psychotherapy: No    RECOMMENDATIONS:  PDMP reviewed. I provided 20 minutes of face to face time during this encounter. She will be due for the Xanax during the time she will be out of state for the wedding.  Therefore I am giving her an increased quantity of the Xanax so there will not be any problems with her running out while she is gone.  She usually only takes 1 Xanax in the evening and will continue to do so. Continue Effexor XR 75 mg, 2 p.o. nightly. Continue Trileptal 300 mg, 1.5 pills p.o. twice daily. Continue Xanax 0.25 mg, 1 p.o. nightly as needed sleep or anxiety. Return in 3 months.  Donnal Moat, PA-C

## 2020-02-12 NOTE — Progress Notes (Signed)
Patient ID: Marie Ramos, female   DOB: 1955-10-05, 64 y.o.   MRN: [id number]    Cardiology Office Note Date:  02/12/2020  Patient ID:  Marie Ramos, Marie Ramos 10-07-1955, MRN [id number] PCP:  Marie Cruel, MD  Cardiologist:  None prior to hospital, (new) Dr. Burt Ramos Electrophysiologist: Dr. Rayann Ramos    Chief Complaint:  6 mo visit   History of Present Illness: Marie Ramos is a 64 y.o. female with history of cardiac arrest 08/24/15, she was at work, AED was immediately applied and recommended and received 2 shocks and CPR, FR found her in PEA with ROSC after epinephrine, very recently found to have AFib via remote device check.   08/24/15: presumd VF arrest with AED advising shocks, CPR, PEA , she had emergent cardiac cath with no CAD, long QT on her EKG noted. Hypokalemic noted as well.   She underwent hypothermia protocol.  Suspected her psych meds were the etiology pf her QT prolongation.  Echo suggested possible HCM,, MRI revealed hypertensive CM, not HCM.  MRI 2/26 noted CVA, neuro on case, possibly related to cath procedure, not recommended to have TEE or further w/u.  She underwent ICD implant  By Dr. Rayann Ramos on 09/04/15.  Psychiatry was consulted and aided in her medications, to avoid QT prolonging meds. Discharged from CIR on the current regime. She developed DVT to LLE started on Eliquis.  She was discharged to CIR until 09/14/15 and has undergone out patient rehab since then.  Is now off a/c  She comes in today being seen for Dr. Rayann Ramos, she was last seen by him in Jan 2019, was doing well, no changes were made to her device or therapy, planned for 6 mo f/u with APPs.    I saw her in July 2019, she was doing well.  Very active caring for her grandchild left her tired at the end of the day and sleeping well.  No CP, palpitations or SOB, no dizziness, near syncope or syncope.  No shocks.  Med list was reviewed with [facility] and confirmed no QT prolonging drugs.  No changes were  made.  12/06/2018 she was referred to the AFib clinic with a remote PPM check noting 2 days of AFib.  Marie Greenland, NP had a tele-health visit though quite unsuccessful in keeping the patient on topic and despite efforts in discussing Afib, embolic risks and [place], the patient (and her husband in the background) were unwilling to discuss.    I saw her again Aug 2020, she was doing well.  No CP, palpitations or SOB, no near syncope or syncope, no shocks.  She fell at her daughter's house (trip/fall) and broke her wrist requiring surgical repair. She had 3 AF episodes noted via her device 12/01/18 34 seconds  (day of her surgery) 5/29 had 2 episodes 38 minutes and 21 minutes Her Afib episodes occurred the day after she was discharged after her wrist surgery and she reports she was in significant pain once home We discussed embolic/stroke risk, potential risk/benefit of a/c.  She does have prior stroke, though she reports this was in the environment of her cardiac arrest, notes report suspect 2/2 LHC procedure.  She wanted to hold off on a/c.  I saw her 08/04/2019 She continues to do well.  No CP, palpitations or cardiac awareness, no SOB or DOE.  She has stairs at her town home/plaza, she walks these for exercise weather permitting.  No dizzy spells, near syncope or syncope. QT was  stable, she had 1 Aflutter episode 13 minutes, discussed role of a/c for her, discussed her stroke hx was suspect to be procedure related and uncertain if would contribute to her risk score. She was not agreeable to consideration of a/c with plans to monitor her burden.   TODAY She is doing well.  Noticed she is more aware of her device (can feel it when laying down and touches her chest).  No CO, palpitations or cardiac awareness.  She has "terrible knees" and has been recommended to have them replaced, but she does not want to do.  They limit her ability to exercise, she walks some for exercise and feels like she has good  exertional capacity. No dizzy spells, no syncope.  No Shocks She was by her eye doctor not too long ago and he told her to tell us he thought she should have the artieries in her neck checked.  He did not mention any signs of blood supply issue that she understood, she had mentioned having floaters intermittently and red eyes. She is looking forward to her niece's wedding, mentioned that guests will either need to be vaccinated or have a negative test within a week of the event.   Device information: MDT dual chamber ICD implanted 09/03/15, Dr. Rayann Ramos, secondary prevention/VF arrest  Past Medical History:  Diagnosis Date  . Bipolar 1 disorder ([place])   . Cardiac arrest ([place]) 08/24/2015  . Chronically dry eyes   . Dyspnea    r/t seasonal allergies  . Hypertrophic obstructive cardiomyopathy ([place]) 08/29/2015  . Macular degeneration   . QT prolongation 08/29/2015  . Stroke ([place])   . TBI (traumatic brain injury) [facility]) 2011   Inpatient rehab [facility]  . Ventricular fibrillation ([place]) 09/03/15   MDT ICD Dr. Rayann Ramos    Past Surgical History:  Procedure Laterality Date  . ABDOMINAL HYSTERECTOMY    . CARDIAC CATHETERIZATION N/A 08/24/2015   Procedure: Left Heart Cath and Coronary Angiography;  Surgeon: Marie Mocha, MD;  Location: [facility];  Service: Cardiovascular;  Laterality: N/A;  . EP IMPLANTABLE DEVICE N/A 09/03/2015   MDT dual chamber ICD  . OPEN REDUCTION INTERNAL FIXATION (ORIF) DISTAL RADIAL FRACTURE Left 12/01/2018   Procedure: OPEN REDUCTION INTERNAL FIXATION (ORIF) LEFT DISTAL RADIAL FRACTURE;  Surgeon: Marie Gash, MD;  Location: [facility];  Service: Orthopedics;  Laterality: Left;  . SPLENECTOMY, TOTAL  2011  . TONSILLECTOMY      Current Outpatient Medications  Medication Sig Dispense Refill  . ALPRAZolam (XANAX) 0.25 MG tablet Take 1 tablet (0.25 mg total) by mouth 2 (two) times daily as needed for anxiety or sleep. 60 tablet 2  . amLODipine (NORVASC) 5 MG tablet Take 1  tablet (5 mg total) by mouth daily with breakfast. 30 tablet 0  . aspirin EC 81 MG tablet Take 81 mg by mouth daily.    Marland Kitchen atorvastatin (LIPITOR) 20 MG tablet Take 1 tablet (20 mg total) by mouth daily at 6 PM. 30 tablet 0  . carvedilol (COREG) 12.5 MG tablet TAKE ONE TABLET BY MOUTH TWICE DAILY WITH BREAKFAST AND DINNER (Patient taking differently: Take 12.5 mg by mouth 2 (two) times daily with a meal. BREAKFAST AND DINNER) 180 tablet 2  . famotidine (PEPCID) 20 MG tablet Take 20 mg by mouth at bedtime.    . fexofenadine (ALLEGRA) 180 MG tablet Take 180 mg by mouth daily.    . fluticasone (FLONASE) 50 MCG/ACT nasal spray Place 1 spray into both nostrils daily.  Use as directed    . furosemide (LASIX) 20 MG tablet TAKE ONE TABLET BY MOUTH ONE TIME DAILY  (Patient taking differently: Take 20 mg by mouth daily. ) 90 tablet 2  . losartan (COZAAR) 100 MG tablet Take 1 tablet (100 mg total) by mouth daily. 90 tablet 3  . Multiple Vitamin (MULTIVITAMIN WITH MINERALS) TABS tablet Take 1 tablet by mouth daily.    . multivitamin (PROSIGHT) TABS tablet Take 1 tablet by mouth daily. 30 each 0  . Oxcarbazepine (TRILEPTAL) 300 MG tablet TAKE ONE AND ONE-HALF TABLETS BY MOUTH TWICE DAILY  270 tablet 0  . Risankizumab-rzaa (SKYRIZI, 150 MG DOSE, [place]) Inject into the skin every 3 (three) months.    . traMADol (ULTRAM) 50 MG tablet Take 50 mg by mouth daily as needed for moderate pain.   0  . triamcinolone ointment (KENALOG) 0.5 % Apply 1 application topically 2 (two) times daily.   0  . venlafaxine XR (EFFEXOR-XR) 75 MG 24 hr capsule Take 2 capsules (150 mg total) by mouth daily with breakfast. 60 capsule 5  . vitamin E 28000 units external oil Apply topically daily.     No current facility-administered medications for this visit.    Allergies:   Sulfa antibiotics, Naproxen, and Vicodin [hydrocodone-acetaminophen]   Social History:  The patient  reports that she has never smoked. She has never used smokeless  tobacco. She reports that she does not drink alcohol and does not use drugs.   Family History:  The patient's family history includes Arthritis/Rheumatoid in her paternal grandmother; CAD in her mother; COPD in her father; Cancer in her mother; Depression in her father, maternal grandfather, and mother; Diabetes in her maternal grandfather.  ROS:  Please see the history of present illness.    All other systems are reviewed and otherwise negative.   PHYSICAL EXAM:  VS:  There were no vitals taken for this visit. BMI: There is no height or weight on file to calculate BMI. Well nourished, well developed, in no acute distress  HEENT: normocephalic, atraumatic  Neck: no JVD, carotid bruits or masses Cardiac: RRR; no significant murmurs, no rubs, or gallops Lungs: CTA b/l , no wheezing, rhonchi or rales  Abd: soft, nontender, central obesity MS: no deformity or atrophy Ext:  no edema  Skin: warm and dry, psoriasis noted b/l LE Neuro:  No gross deficits appreciated Psych: euthymic mood, full affect   ICD site is stable,well healed, no tethering or discomfort   EKG:  Done today and reviewed by myself:  SR 73bpm, QT looks OK   ICD interrogation today and reviewed by myself.  Battery and lead measurements are good No arrhythmias or therapies OptiVol is way down   07/14/16: TTE Study Conclusions - Left ventricle: The cavity size was normal. Wall thickness was   increased in a pattern of mild LVH. Systolic function was normal.   The estimated ejection fraction was in the range of 55% to 60%.   Wall motion was normal; there were no regional wall motion   abnormalities. Doppler parameters are consistent with abnormal   left ventricular relaxation (grade 1 diastolic dysfunction). - Aortic valve: There was no stenosis. - Mitral valve: Mildly calcified annulus. There was no significant   regurgitation. - Right ventricle: The cavity size was normal. Pacer wire or   catheter noted in right  ventricle. Systolic function was normal. - Pulmonary arteries: No complete TR doppler jet so unable to   estimate PA systolic pressure. -  Inferior vena cava: The vessel was normal in size. The   respirophasic diameter changes were in the normal range (= 50%),   consistent with normal central venous pressure. Impressions: - Normal LV size with mild LV hypertrophy. EF 55-60%. Normal RV   size and systolic function. No significant valvular   abnormalities.  08/24/15: Echocardiogram:  Study Conclusions - Left ventricle: The cavity size was normal. Systolic function was  mildly to moderately reduced. The estimated ejection fraction was  in the range of 40% to 45%. There is severe asymmetric septal  hypetrophy without dynamic obstruction. Findings suggestive of  hypertrophic nonobstructive cardiomyopathy. Diffuse hypokinesis.  Doppler parameters are consistent with abnormal left ventricular  relaxation (grade 1 diastolic dysfunction). Septal thickness, ED  (2D): 17 mm. Posterior wall thickness, ED (2D): 10.5 mm. - Technically difficult study. Impressions: - Technically difficult study, however appears to have asymmetric  septal hypertrophic cardiomyoapathy without obstruction. In  setting of VF arrest, would consider cardiac MRI to better  evaluate as this could be the potential etiology of her  arrhythmia.  08/24/15: LHC Conclusion    Widely patent coronary arteries Normal LVEDP       Recent Labs: 08/16/2019: B Natriuretic Peptide 312.9; TSH 1.218 08/21/2019: ALT 48; BUN 40; Creatinine, Ser 0.91; Hemoglobin 11.5; Platelets 623; Potassium 5.0; Sodium 140  08/16/2019: Triglycerides 128   CrCl cannot be calculated (Patient's most recent lab result is older than the maximum 21 days allowed.).   Wt Readings from Last 3 Encounters:  08/16/19 260 lb (117.9 kg)  08/04/19 260 lb (117.9 kg)  03/02/19 261 lb (118.4 kg)     Other studies reviewed: Additional studies/records  reviewed today include: summarized above    ASSESSMENT AND PLAN:  1. VF arrest, QT prolongation felt secondary to psych meds 2. ICD     meds reviewed      QT is OK today     Reminded her to always ask the pharmacist to check prescriptions for QT        3. Bipolar     Continue f/u with psych      Avoid QT prolonging medications and caution with antipsychotics in the future     She is aware  4. HTN 5. hypertensive CM, chronic CHF (disatolic)     She has lost quite a bit of weight!  She had not particularly noticed wearing her summer clothes that tend to be looser fitting      No symptoms or exam findings to suggest fluid OL       OptiVol is way down      A recheck in her BP is only a little better, 150/88 She is encouraged to invest in a home machine.  She is very limited on a fixed income, though can borrow her mother in laws for a few weeks and she will let us know how it looks.       62. AFib    CHA2DS2Vasc is one for HTN, 2 with female     she does have prior stroke, though she reports this was in the environment of her cardiac arrest, notes report suspect 2/2 LHC procedure     None further so far  We have previously discussed AFib and embolic/stroke risk. 2, PLUS he prior stroke (though suspect to have been procedure related) She has not wanted to consider a/c, should we see more she would be willing to re-discuss   7. Her opthamologist recommended she have her carotis checked  She was not certain exactly why     No bruits today though given the recommendation will get ordered           Disposition:  Continue remotes Q 54mo, and will have her back in 31mo, sooner if needed    Current medicines are reviewed at length with the patient today.  The patient did not have any concerns regarding medicines.  Haywood Lasso, PA-C 02/12/2020 10:31 AM     [address] [place] [place] [address] [phone] (office)  [phone]  (fax)

## 2020-02-13 ENCOUNTER — Ambulatory Visit (INDEPENDENT_AMBULATORY_CARE_PROVIDER_SITE_OTHER): Payer: Medicare HMO

## 2020-02-13 ENCOUNTER — Other Ambulatory Visit: Payer: Self-pay

## 2020-02-13 ENCOUNTER — Ambulatory Visit: Payer: Medicare HMO | Admitting: Physician Assistant

## 2020-02-13 VITALS — BP 167/91 | HR 80 | Ht 62.0 in | Wt 236.0 lb

## 2020-02-13 DIAGNOSIS — R9431 Abnormal electrocardiogram [ECG] [EKG]: Secondary | ICD-10-CM

## 2020-02-13 DIAGNOSIS — Z95810 Presence of automatic (implantable) cardiac defibrillator: Secondary | ICD-10-CM

## 2020-02-13 DIAGNOSIS — I10 Essential (primary) hypertension: Secondary | ICD-10-CM

## 2020-02-13 DIAGNOSIS — I5032 Chronic diastolic (congestive) heart failure: Secondary | ICD-10-CM

## 2020-02-13 DIAGNOSIS — R0989 Other specified symptoms and signs involving the circulatory and respiratory systems: Secondary | ICD-10-CM

## 2020-02-13 NOTE — Patient Instructions (Addendum)
Medication Instructions:  Your physician recommends that you continue on your current medications as directed. Please refer to the Current Medication list given to you today.  *If you need a refill on your cardiac medications before your next appointment, please call your pharmacy*   Lab Work: [place]   If you have labs (blood work) drawn today and your tests are completely normal, you will receive your results only by: Marland Kitchen MyChart Message (if you have MyChart) OR . A paper copy in the mail If you have any lab test that is abnormal or we need to change your treatment, we will call you to review the results.   Testing/Procedures: Your physician has requested that you have a carotid duplex. This test is an ultrasound of the carotid arteries in your neck. It looks at blood flow through these arteries that supply the brain with blood. Allow one hour for this exam. There are no restrictions or special instructions.    Follow-Up: At [facility], you and your health needs are our priority.  As part of our continuing mission to provide you with exceptional heart care, we have created designated Provider Care Teams.  These Care Teams include your primary Cardiologist (physician) and Advanced Practice Providers (APPs -  Physician Assistants and Nurse Practitioners) who all work together to provide you with the care you need, when you need it.  We recommend signing up for the patient portal called "MyChart".  Sign up information is provided on this After Visit Summary.  MyChart is used to connect with patients for Virtual Visits (Telemedicine).  Patients are able to view lab/test results, encounter notes, upcoming appointments, etc.  Non-urgent messages can be sent to your provider as well.   To learn more about what you can do with MyChart, go to [email or web address].    Your next appointment:   6 month(s)  The format for your next appointment:   In Person  Provider:   You may  see Thompson Grayer, MD or one of the following Advanced Practice Providers on your designated Care Team:    Chanetta Marshall, NP  Tommye Standard, PA-C  Legrand Como "Oda [place], [place]    Other Instructions

## 2020-02-14 NOTE — Progress Notes (Signed)
EPIC Encounter for ICM Monitoring  Patient Name: Marie Ramos is a 64 y.o. female Date: 02/14/2020 Primary Care Physican: Lawerance Cruel, MD Primary Cardiologist: Allred Electrophysiologist: Allred 8/9/2021Office Weight:236lbs  Transmission reviewed.  Optivol thoracic impedancenormal.  Prescribed:Furosemide 20 mg 1 tablet daily  Labs: 08/21/2019 Creatinine0.91, BUN40, Potassium5.0, Sodium140, GFR>60 08/20/2019 Creatinine0.80, BUN43, Potassium4.7, Sodium141, GFR>60  08/19/2019 Creatinine1.20, BUN41, Potassium4.4, [id number], PPH43-27  02/11/2021Creatinine1.27BUN 37, Potassium3.9, [id number], GFR45-52  02/10/2021Creatinine 1.18, BUN23, Potassium4.2, [id number], DUK38-38 02/09/2021Creatinine 1.19, BUN23, Potassium4.1, [id number], KGO77-03 A complete set of results can be found in Results Review.  Recommendations:No changes  Follow-up plan: ICM clinic phone appointment on9/13/2021. 91 day device clinic remote transmission8/06/2020.   EP/Cardiology Office Visits: 08/11/2020 with Tommye Standard, PA.    Copy of ICM check sent to Dr. Rayann Heman.   3 month ICM trend: 02/13/2020    1 Year ICM trend:       [name], RN 02/14/2020 3:03 PM

## 2020-02-16 ENCOUNTER — Ambulatory Visit (INDEPENDENT_AMBULATORY_CARE_PROVIDER_SITE_OTHER): Payer: Medicare HMO | Admitting: *Deleted

## 2020-02-16 DIAGNOSIS — I469 Cardiac arrest, cause unspecified: Secondary | ICD-10-CM

## 2020-02-16 LAB — CUP PACEART REMOTE DEVICE CHECK
Battery Remaining Longevity: 63 mo
Battery Voltage: 2.97 V
Brady Statistic AP VP Percent: 0.01 %
Brady Statistic AP VS Percent: 0.55 %
Brady Statistic AS VP Percent: 0.09 %
Brady Statistic AS VS Percent: 99.36 %
Brady Statistic RA Percent Paced: 0.55 %
Brady Statistic RV Percent Paced: 0.1 %
Date Time Interrogation Session: 20210812022826
HighPow Impedance: 87 Ohm
Implantable Lead Implant Date: 20170227
Implantable Lead Implant Date: 20170227
Implantable Lead Location: 753859
Implantable Lead Location: 753860
Implantable Lead Model: 5076
Implantable Pulse Generator Implant Date: 20170227
Lead Channel Impedance Value: 399 Ohm
Lead Channel Impedance Value: 399 Ohm
Lead Channel Impedance Value: 532 Ohm
Lead Channel Pacing Threshold Amplitude: 0.5 V
Lead Channel Pacing Threshold Amplitude: 0.625 V
Lead Channel Pacing Threshold Pulse Width: 0.4 ms
Lead Channel Pacing Threshold Pulse Width: 0.4 ms
Lead Channel Sensing Intrinsic Amplitude: 23.5 mV
Lead Channel Sensing Intrinsic Amplitude: 23.5 mV
Lead Channel Sensing Intrinsic Amplitude: 3.125 mV
Lead Channel Sensing Intrinsic Amplitude: 3.125 mV
Lead Channel Setting Pacing Amplitude: 2 V
Lead Channel Setting Pacing Amplitude: 2.5 V
Lead Channel Setting Pacing Pulse Width: 0.4 ms
Lead Channel Setting Sensing Sensitivity: 0.3 mV

## 2020-02-20 NOTE — Progress Notes (Signed)
Remote ICD transmission.   

## 2020-02-22 DIAGNOSIS — Z03818 Encounter for observation for suspected exposure to other biological agents ruled out: Secondary | ICD-10-CM | POA: Diagnosis not present

## 2020-02-22 DIAGNOSIS — Z20822 Contact with and (suspected) exposure to covid-19: Secondary | ICD-10-CM | POA: Diagnosis not present

## 2020-03-05 ENCOUNTER — Ambulatory Visit (HOSPITAL_COMMUNITY)
Admission: RE | Admit: 2020-03-05 | Payer: Medicare HMO | Source: Ambulatory Visit | Attending: Physician Assistant | Admitting: Physician Assistant

## 2020-03-06 ENCOUNTER — Other Ambulatory Visit (HOSPITAL_COMMUNITY): Payer: Self-pay | Admitting: Family Medicine

## 2020-03-06 DIAGNOSIS — I679 Cerebrovascular disease, unspecified: Secondary | ICD-10-CM

## 2020-03-09 ENCOUNTER — Other Ambulatory Visit: Payer: Self-pay | Admitting: Physician Assistant

## 2020-03-09 NOTE — Telephone Encounter (Signed)
I think this is yours.

## 2020-03-09 NOTE — Telephone Encounter (Signed)
Please review

## 2020-03-13 ENCOUNTER — Encounter (HOSPITAL_COMMUNITY): Payer: Medicare HMO

## 2020-03-14 DIAGNOSIS — L400 Psoriasis vulgaris: Secondary | ICD-10-CM | POA: Diagnosis not present

## 2020-03-15 ENCOUNTER — Ambulatory Visit (HOSPITAL_COMMUNITY)
Admission: RE | Admit: 2020-03-15 | Discharge: 2020-03-15 | Disposition: A | Discharge status: Home / Self Care | Payer: Medicare HMO | Source: Ambulatory Visit | Attending: Family Medicine | Admitting: Family Medicine

## 2020-03-15 ENCOUNTER — Other Ambulatory Visit: Payer: Self-pay

## 2020-03-15 DIAGNOSIS — I679 Cerebrovascular disease, unspecified: Secondary | ICD-10-CM | POA: Insufficient documentation

## 2020-03-15 NOTE — Progress Notes (Signed)
Carotid study completed.   See CVProc for preliminary results.   Marie Ramos, RDMS, RVT 

## 2020-03-19 ENCOUNTER — Ambulatory Visit (INDEPENDENT_AMBULATORY_CARE_PROVIDER_SITE_OTHER): Payer: Medicare HMO

## 2020-03-19 DIAGNOSIS — I5032 Chronic diastolic (congestive) heart failure: Secondary | ICD-10-CM

## 2020-03-19 DIAGNOSIS — Z95810 Presence of automatic (implantable) cardiac defibrillator: Secondary | ICD-10-CM | POA: Diagnosis not present

## 2020-03-20 NOTE — Progress Notes (Signed)
EPIC Encounter for ICM Monitoring  Patient Name: Marie Ramos is a 64 y.o. female Date: 03/20/2020 Primary Care Physican: Lawerance Cruel, MD Primary Cardiologist: Allred Electrophysiologist: Allred 8/9/2021Office Weight:236lbs  Spoke with patient and reports feeling well at this time.  Denies fluid symptoms.    Optivol thoracic impedancenormal.  Prescribed:Furosemide 20 mg 1 tablet daily  Labs: 08/21/2019 Creatinine0.91, BUN40, Potassium5.0, Sodium140, GFR>60 08/20/2019 Creatinine0.80, BUN43, Potassium4.7, Sodium141, GFR>60  08/19/2019 Creatinine1.20, BUN41, Potassium4.4, [id number], KGY17-12  02/11/2021Creatinine1.27BUN 37, Potassium3.9, [id number], GFR45-52  02/10/2021Creatinine 1.18, BUN23, Potassium4.2, [id number], IPP95-58 02/09/2021Creatinine 1.19, BUN23, Potassium4.1, [id number], ZGF48-34 A complete set of results can be found in Results Review.  Recommendations:No changes and encouraged to call if experiencing any fluid symptoms.  Follow-up plan: ICM clinic phone appointment on10/18/2021. 91 day device clinic remote transmission11/05/2020.   EP/Cardiology Office Visits: 08/11/2020 with Tommye Standard, PA.    Copy of ICM check sent to Dr. Rayann Heman.   3 month ICM trend: 03/19/2020    1 Year ICM trend:       [name], RN 03/20/2020 1:54 PM

## 2020-04-11 DIAGNOSIS — J019 Acute sinusitis, unspecified: Secondary | ICD-10-CM | POA: Diagnosis not present

## 2020-04-23 ENCOUNTER — Ambulatory Visit (INDEPENDENT_AMBULATORY_CARE_PROVIDER_SITE_OTHER): Payer: Medicare HMO

## 2020-04-23 DIAGNOSIS — I5032 Chronic diastolic (congestive) heart failure: Secondary | ICD-10-CM

## 2020-04-23 DIAGNOSIS — Z95810 Presence of automatic (implantable) cardiac defibrillator: Secondary | ICD-10-CM

## 2020-04-24 NOTE — Progress Notes (Signed)
EPIC Encounter for ICM Monitoring  Patient Name: Marie Ramos is a 64 y.o. female Date: 04/24/2020 Primary Care Physican: Lawerance Cruel, MD Primary Cardiologist: Allred Electrophysiologist: Allred 8/9/2021OfficeWeight:236lbs  Transmission reviewed.   Optivol thoracic impedancenormal.  Prescribed:Furosemide 20 mg 1 tablet daily  Labs: 08/21/2019 Creatinine0.91, BUN40, Potassium5.0, Sodium140, GFR>60 08/20/2019 Creatinine0.80, BUN43, Potassium4.7, Sodium141, GFR>60  08/19/2019 Creatinine1.20, BUN41, Potassium4.4, [id number], BHA19-37  02/11/2021Creatinine1.27BUN 37, Potassium3.9, [id number], GFR45-52  02/10/2021Creatinine 1.18, BUN23, Potassium4.2, [id number], TMH96-22 02/09/2021Creatinine 1.19, BUN23, Potassium4.1, WLNLGX211, HER74-08 A complete set of results can be found in Results Review.  Recommendations:No changes and encouraged to call if experiencing any fluid symptoms.  Follow-up plan: ICM clinic phone appointment on11/22/2021. 91 day device clinic remote transmission11/05/2020.   EP/Cardiology Office Visits:2/5/2022with Newt Lukes.   Copy of ICM check sent to Dr.Allred.    3 month ICM trend: 04/23/2020    1 Year ICM trend:       [name], RN 04/24/2020 4:57 PM

## 2020-04-25 ENCOUNTER — Other Ambulatory Visit: Payer: Self-pay

## 2020-04-25 ENCOUNTER — Ambulatory Visit (INDEPENDENT_AMBULATORY_CARE_PROVIDER_SITE_OTHER): Payer: Medicare HMO | Admitting: Physician Assistant

## 2020-04-25 ENCOUNTER — Encounter: Payer: Self-pay | Admitting: Physician Assistant

## 2020-04-25 DIAGNOSIS — F99 Mental disorder, not otherwise specified: Secondary | ICD-10-CM

## 2020-04-25 DIAGNOSIS — F319 Bipolar disorder, unspecified: Secondary | ICD-10-CM | POA: Diagnosis not present

## 2020-04-25 DIAGNOSIS — F5105 Insomnia due to other mental disorder: Secondary | ICD-10-CM

## 2020-04-25 DIAGNOSIS — F411 Generalized anxiety disorder: Secondary | ICD-10-CM

## 2020-04-25 MED ORDER — ALPRAZOLAM 0.25 MG PO TABS: 0.2500 mg | ORAL_TABLET | Freq: Two times a day (BID) | ORAL | 2 refills | Status: DC | PRN

## 2020-04-25 NOTE — Progress Notes (Signed)
[facility] Med Check  Patient ID: Marie Ramos,  MRN: [id number]  PCP: Lawerance Cruel, MD  Date of Evaluation: 04/25/2020 Time spent:20 minutes  Chief Complaint:  Chief Complaint    Anxiety; Depression      HISTORY/CURRENT STATUS: HPI For routine med check.  Doing really well. Went to a family member's wedding at [facility] in the summer. She shows me beautiful pictures! Really enjoyed that.   Feels like she is doing well with current medication regimen.  She is able to enjoy things.  Energy and motivation are good.  Appetite is normal without increase or decrease in weight.  Not crying easily. 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 Denies suicidal or homicidal thoughts.  Patient denies increased energy with decreased need for sleep, no increased talkativeness, no racing thoughts, no impulsivity or risky behaviors, no increased spending, no increased libido, no grandiosity.  Anxiety is well controlled.  States she wouldn't be able to fall asleep if she didn't have a xanax b/c her mind races.  Occasionally has anxiety during the day but not often.  Denies dizziness, syncope, seizures, numbness, tingling, tremor, tics, unsteady gait, slurred speech, confusion. Denies muscle or joint pain, stiffness, or dystonia.  Individual Medical History/ Review of Systems: Changes? :No     Past medications for mental health diagnoses include: Lithium, Trileptal, Effexor XR,  Allergies: Sulfa antibiotics, Naproxen, and Vicodin [hydrocodone-acetaminophen]  Current Medications:  Current Outpatient Medications:    ALPRAZolam (XANAX) 0.25 MG tablet, Take 1 tablet (0.25 mg total) by mouth 2 (two) times daily as needed for anxiety or sleep., Disp: 60 tablet, Rfl: 2   amLODipine (NORVASC) 5 MG tablet, Take 1 tablet (5 mg total) by mouth daily with breakfast., Disp: 30 tablet, Rfl: 0   aspirin EC 81 MG tablet, Take 81 mg by mouth daily., Disp: , Rfl:    atorvastatin  (LIPITOR) 20 MG tablet, Take 1 tablet (20 mg total) by mouth daily at 6 PM., Disp: 30 tablet, Rfl: 0   carvedilol (COREG) 12.5 MG tablet, TAKE ONE TABLET BY MOUTH TWICE DAILY WITH BREAKFAST AND DINNER (Patient taking differently: Take 12.5 mg by mouth 2 (two) times daily with a meal. BREAKFAST AND DINNER), Disp: 180 tablet, Rfl: 2   famotidine (PEPCID) 20 MG tablet, Take 20 mg by mouth at bedtime., Disp: , Rfl:    fexofenadine (ALLEGRA) 180 MG tablet, Take 180 mg by mouth daily., Disp: , Rfl:    fluticasone (FLONASE) 50 MCG/ACT nasal spray, Place 1 spray into both nostrils daily. Use as directed, Disp: , Rfl:    furosemide (LASIX) 20 MG tablet, TAKE ONE TABLET BY MOUTH ONE TIME DAILY  (Patient taking differently: Take 20 mg by mouth daily. ), Disp: 90 tablet, Rfl: 2   losartan (COZAAR) 100 MG tablet, Take 1 tablet (100 mg total) by mouth daily., Disp: 90 tablet, Rfl: 3   Multiple Vitamin (MULTIVITAMIN WITH MINERALS) TABS tablet, Take 1 tablet by mouth daily., Disp: , Rfl:    multivitamin (PROSIGHT) TABS tablet, Take 1 tablet by mouth daily., Disp: 30 each, Rfl: 0   Oxcarbazepine (TRILEPTAL) 300 MG tablet, TAKE ONE AND ONE-HALF TABLETS BY MOUTH TWICE DAILY, Disp: 270 tablet, Rfl: 0   Risankizumab-rzaa (SKYRIZI, 150 MG DOSE, [place]), Inject into the skin every 3 (three) months., Disp: , Rfl:    triamcinolone ointment (KENALOG) 0.5 %, Apply 1 application topically 2 (two) times daily. , Disp: , Rfl: 0   venlafaxine XR (EFFEXOR-XR) 75 MG  24 hr capsule, Take 2 capsules (150 mg total) by mouth daily with breakfast., Disp: 60 capsule, Rfl: 5   vitamin E 28000 units external oil, Apply topically daily., Disp: , Rfl:  Medication Side Effects: none  Family Medical/ Social History: Changes? No  MENTAL HEALTH EXAM:  There were no vitals taken for this visit.There is no height or weight on file to calculate BMI.  General Appearance: Casual, Neat, Well Groomed and Obese  Eye Contact:  Good  Speech:   Clear and Coherent and Normal Rate  Volume:  Normal  Mood:  Euthymic  Affect:  Appropriate  Thought Process:  Goal Directed and Descriptions of Associations: Intact  Orientation:  Full (Time, Place, and Person)  Thought Content: Logical   Suicidal Thoughts:  No  Homicidal Thoughts:  No  Memory:  WNL  Judgement:  Good  Insight:  Good  Psychomotor Activity:  Normal  Concentration:  Concentration: Good  Recall:  Good  Fund of Knowledge: Good  Language: Good  Assets:  Desire for Improvement  ADL's:  Intact  Cognition: WNL  Prognosis:  Good    DIAGNOSES:    ICD-10-CM   1. Bipolar I disorder ([place])  F31.9   2. Generalized anxiety disorder  F41.1   3. Insomnia due to other mental disorder  F51.05    F99     Receiving Psychotherapy: No    RECOMMENDATIONS:  PDMP reviewed. I provided 20 minutes of face to face time during this encounter. I'm glad she's doing so well!  Continue Effexor XR 75 mg, 2 p.o. nightly. Continue Trileptal 300 mg, 1.5 pills p.o. twice daily. Continue Xanax 0.25 mg, 1 p.o. nightly as needed sleep or anxiety. Return in 4 months.  Donnal Moat, PA-C

## 2020-05-17 ENCOUNTER — Ambulatory Visit (INDEPENDENT_AMBULATORY_CARE_PROVIDER_SITE_OTHER): Payer: Medicare HMO

## 2020-05-17 DIAGNOSIS — I429 Cardiomyopathy, unspecified: Secondary | ICD-10-CM | POA: Diagnosis not present

## 2020-05-17 LAB — CUP PACEART REMOTE DEVICE CHECK
Battery Remaining Longevity: 63 mo
Battery Voltage: 2.98 V
Brady Statistic AP VP Percent: 0 %
Brady Statistic AP VS Percent: 0.01 %
Brady Statistic AS VP Percent: 0.04 %
Brady Statistic AS VS Percent: 99.94 %
Brady Statistic RA Percent Paced: 0.02 %
Brady Statistic RV Percent Paced: 0.05 %
Date Time Interrogation Session: 20211111043824
HighPow Impedance: 85 Ohm
Implantable Lead Implant Date: 20170227
Implantable Lead Implant Date: 20170227
Implantable Lead Location: 753859
Implantable Lead Location: 753860
Implantable Lead Model: 5076
Implantable Pulse Generator Implant Date: 20170227
Lead Channel Impedance Value: 399 Ohm
Lead Channel Impedance Value: 456 Ohm
Lead Channel Impedance Value: 513 Ohm
Lead Channel Pacing Threshold Amplitude: 0.375 V
Lead Channel Pacing Threshold Amplitude: 0.5 V
Lead Channel Pacing Threshold Pulse Width: 0.4 ms
Lead Channel Pacing Threshold Pulse Width: 0.4 ms
Lead Channel Sensing Intrinsic Amplitude: 22.75 mV
Lead Channel Sensing Intrinsic Amplitude: 22.75 mV
Lead Channel Sensing Intrinsic Amplitude: 3.625 mV
Lead Channel Sensing Intrinsic Amplitude: 3.625 mV
Lead Channel Setting Pacing Amplitude: 2 V
Lead Channel Setting Pacing Amplitude: 2.5 V
Lead Channel Setting Pacing Pulse Width: 0.4 ms
Lead Channel Setting Sensing Sensitivity: 0.3 mV

## 2020-05-21 NOTE — Progress Notes (Signed)
Remote ICD transmission.   

## 2020-05-26 ENCOUNTER — Other Ambulatory Visit: Payer: Self-pay | Admitting: Physician Assistant

## 2020-05-28 ENCOUNTER — Ambulatory Visit (INDEPENDENT_AMBULATORY_CARE_PROVIDER_SITE_OTHER): Payer: Medicare HMO

## 2020-05-28 DIAGNOSIS — I5032 Chronic diastolic (congestive) heart failure: Secondary | ICD-10-CM

## 2020-05-28 DIAGNOSIS — Z95810 Presence of automatic (implantable) cardiac defibrillator: Secondary | ICD-10-CM

## 2020-05-29 NOTE — Progress Notes (Signed)
EPIC Encounter for ICM Monitoring  Patient Name: Marie Ramos is a 64 y.o. female Date: 05/29/2020 Primary Care Physican: Lawerance Cruel, MD Primary Cardiologist: Allred Electrophysiologist: Allred 11/23/2021Weight:236lbs  Spoke with patient and reports feeling well at this time.  Denies fluid symptoms.    Optivol thoracic impedancenormal.  Prescribed:Furosemide 20 mg 1 tablet daily  Labs: 08/21/2019 Creatinine0.91, BUN40, Potassium5.0, Sodium140, GFR>60 08/20/2019 Creatinine0.80, BUN43, Potassium4.7, Sodium141, GFR>60  08/19/2019 Creatinine1.20, BUN41, Potassium4.4, CBJSEG315, VVO16-07  02/11/2021Creatinine1.27BUN 37, Potassium3.9, PXTGGY694, GFR45-52  02/10/2021Creatinine 1.18, BUN23, Potassium4.2, WNIOEV035, KKX38-18 02/09/2021Creatinine 1.19, BUN23, Potassium4.1, EXHBZJ696, VEL38-10 A complete set of results can be found in Results Review.  Recommendations:  No changes and encouraged to call if experiencing any fluid symptoms.  Follow-up plan: ICM clinic phone appointment on12/27/2021. 91 day device clinic remote transmission2/04/2021.   EP/Cardiology Office Visits:2/5/2022with Newt Lukes.   Copy of ICM check sent to Dr.Allred.    3 month ICM trend: 05/28/2020    1 Year ICM trend:       [name], RN 05/29/2020 12:40 PM

## 2020-06-19 ENCOUNTER — Other Ambulatory Visit: Payer: Self-pay | Admitting: Physician Assistant

## 2020-06-19 MED ORDER — VENLAFAXINE HCL ER 75 MG PO CP24: 150.0000 mg | ORAL_CAPSULE | Freq: Every day | ORAL | 1 refills | Status: DC

## 2020-07-02 ENCOUNTER — Ambulatory Visit (INDEPENDENT_AMBULATORY_CARE_PROVIDER_SITE_OTHER): Payer: Medicare HMO

## 2020-07-02 DIAGNOSIS — I5032 Chronic diastolic (congestive) heart failure: Secondary | ICD-10-CM

## 2020-07-02 DIAGNOSIS — L400 Psoriasis vulgaris: Secondary | ICD-10-CM | POA: Diagnosis not present

## 2020-07-02 DIAGNOSIS — Z95810 Presence of automatic (implantable) cardiac defibrillator: Secondary | ICD-10-CM

## 2020-07-04 NOTE — Progress Notes (Signed)
EPIC Encounter for ICM Monitoring  Patient Name: Marie Ramos is a 64 y.o. female Date: 07/04/2020 Primary Care Physican: Daisy Floro, MD Primary Cardiologist: Allred Electrophysiologist: Allred 12/29/2021Weight:238lbs  Spoke with patient and reports feeling well at this time.  Denies fluid symptoms.    Optivol thoracic impedancenormal.  Prescribed:Furosemide 20 mg 1 tablet daily  Labs: 08/21/2019 Creatinine0.91, BUN40, Potassium5.0, Sodium140, GFR>60 08/20/2019 Creatinine0.80, BUN43, Potassium4.7, Sodium141, GFR>60  08/19/2019 Creatinine1.20, BUN41, Potassium4.4, Sodium139, KWI09-73  02/11/2021Creatinine1.27BUN 37, Potassium3.9, Sodium140, GFR45-52  02/10/2021Creatinine 1.18, BUN23, Potassium4.2, Sodium145, ZHG99-24 02/09/2021Creatinine 1.19, BUN23, Potassium4.1, QASTMH962, IWL79-89 A complete set of results can be found in Results Review.  Recommendations:No changes and encouraged to call if experiencing any fluid symptoms.  Follow-up plan: ICM clinic phone appointment on2/01/2021. 91 day device clinic remote transmission2/04/2021.   EP/Cardiology Office Visits: Recall 2/5/2022with Marie Ramos.   Copy of ICM check sent to Dr.Allred.    3 month ICM trend: 07/02/2020    1 Year ICM trend:       Marie Soda, RN 07/04/2020 1:23 PM

## 2020-07-10 ENCOUNTER — Telehealth: Payer: Self-pay | Admitting: Emergency Medicine

## 2020-07-10 NOTE — Telephone Encounter (Signed)
Patient called due to alert for ongoing AF on  07/08/20 transmission at 2220. Patient reports she has not had any edema in BLE, CP, chest pressure, dizziness, syncope , or SOB. She reports that she has been sleeping more but has not noticed any increased fatigue. Remote transmission sent and reviewed that showed she is no longer in AF. She had 11 episodes of AF from 07/07/20 to transmission on 1/4/22with longest episode lasting 20 minutes, 49 seconds, no OAC. Will schedule with AF clinic and she is due for F/U with Macky Lower PA next month. Patient will expect call from AF clinic for appointment.

## 2020-07-10 NOTE — Telephone Encounter (Signed)
Called and spoke with patient.  She is aware of appt 07/19/2020 with Rudi Coco, NP.

## 2020-07-19 ENCOUNTER — Ambulatory Visit (HOSPITAL_COMMUNITY): Payer: Medicare HMO | Admitting: Nurse Practitioner

## 2020-07-25 ENCOUNTER — Encounter (HOSPITAL_COMMUNITY): Payer: Self-pay

## 2020-07-25 ENCOUNTER — Ambulatory Visit (HOSPITAL_COMMUNITY): Payer: Medicare HMO | Admitting: Nurse Practitioner

## 2020-07-25 NOTE — Telephone Encounter (Signed)
Patient a no show for afib clinic appointment.

## 2020-08-07 ENCOUNTER — Encounter (HOSPITAL_COMMUNITY): Payer: Self-pay

## 2020-08-07 ENCOUNTER — Ambulatory Visit (HOSPITAL_COMMUNITY): Payer: Medicare HMO | Admitting: Nurse Practitioner

## 2020-08-13 ENCOUNTER — Ambulatory Visit (INDEPENDENT_AMBULATORY_CARE_PROVIDER_SITE_OTHER): Payer: Medicare HMO

## 2020-08-13 DIAGNOSIS — Z95810 Presence of automatic (implantable) cardiac defibrillator: Secondary | ICD-10-CM | POA: Diagnosis not present

## 2020-08-13 DIAGNOSIS — I5032 Chronic diastolic (congestive) heart failure: Secondary | ICD-10-CM

## 2020-08-14 ENCOUNTER — Other Ambulatory Visit: Payer: Self-pay | Admitting: Physician Assistant

## 2020-08-16 ENCOUNTER — Encounter (HOSPITAL_COMMUNITY): Payer: Self-pay | Admitting: Nurse Practitioner

## 2020-08-16 ENCOUNTER — Ambulatory Visit (INDEPENDENT_AMBULATORY_CARE_PROVIDER_SITE_OTHER): Payer: Medicare HMO

## 2020-08-16 ENCOUNTER — Ambulatory Visit (HOSPITAL_COMMUNITY)
Admission: RE | Admit: 2020-08-16 | Discharge: 2020-08-16 | Disposition: A | Discharge status: Home / Self Care | Payer: Medicare HMO | Source: Ambulatory Visit | Attending: Nurse Practitioner | Admitting: Nurse Practitioner

## 2020-08-16 ENCOUNTER — Other Ambulatory Visit: Payer: Self-pay

## 2020-08-16 VITALS — BP 176/96 | HR 103 | Ht 62.0 in | Wt 267.0 lb

## 2020-08-16 DIAGNOSIS — I480 Paroxysmal atrial fibrillation: Secondary | ICD-10-CM | POA: Insufficient documentation

## 2020-08-16 DIAGNOSIS — I429 Cardiomyopathy, unspecified: Secondary | ICD-10-CM

## 2020-08-16 DIAGNOSIS — Z7901 Long term (current) use of anticoagulants: Secondary | ICD-10-CM | POA: Insufficient documentation

## 2020-08-16 DIAGNOSIS — Z86718 Personal history of other venous thrombosis and embolism: Secondary | ICD-10-CM | POA: Insufficient documentation

## 2020-08-16 DIAGNOSIS — Z79899 Other long term (current) drug therapy: Secondary | ICD-10-CM | POA: Diagnosis not present

## 2020-08-16 DIAGNOSIS — I10 Essential (primary) hypertension: Secondary | ICD-10-CM | POA: Diagnosis not present

## 2020-08-16 DIAGNOSIS — Z8673 Personal history of transient ischemic attack (TIA), and cerebral infarction without residual deficits: Secondary | ICD-10-CM | POA: Insufficient documentation

## 2020-08-16 DIAGNOSIS — Z8674 Personal history of sudden cardiac arrest: Secondary | ICD-10-CM | POA: Diagnosis not present

## 2020-08-16 DIAGNOSIS — D6869 Other thrombophilia: Secondary | ICD-10-CM

## 2020-08-16 LAB — CUP PACEART REMOTE DEVICE CHECK
Battery Remaining Longevity: 56 mo
Battery Voltage: 2.98 V
Brady Statistic AP VP Percent: 0 %
Brady Statistic AP VS Percent: 0 %
Brady Statistic AS VP Percent: 0.02 %
Brady Statistic AS VS Percent: 99.98 %
Brady Statistic RA Percent Paced: 0 %
Brady Statistic RV Percent Paced: 0.02 %
Date Time Interrogation Session: 20220210001704
HighPow Impedance: 90 Ohm
Implantable Lead Implant Date: 20170227
Implantable Lead Implant Date: 20170227
Implantable Lead Location: 753859
Implantable Lead Location: 753860
Implantable Lead Model: 5076
Implantable Pulse Generator Implant Date: 20170227
Lead Channel Impedance Value: 399 Ohm
Lead Channel Impedance Value: 418 Ohm
Lead Channel Impedance Value: 532 Ohm
Lead Channel Pacing Threshold Amplitude: 0.5 V
Lead Channel Pacing Threshold Amplitude: 0.5 V
Lead Channel Pacing Threshold Pulse Width: 0.4 ms
Lead Channel Pacing Threshold Pulse Width: 0.4 ms
Lead Channel Sensing Intrinsic Amplitude: 24.125 mV
Lead Channel Sensing Intrinsic Amplitude: 24.125 mV
Lead Channel Sensing Intrinsic Amplitude: 3.375 mV
Lead Channel Sensing Intrinsic Amplitude: 3.375 mV
Lead Channel Setting Pacing Amplitude: 2 V
Lead Channel Setting Pacing Amplitude: 2.5 V
Lead Channel Setting Pacing Pulse Width: 0.4 ms
Lead Channel Setting Sensing Sensitivity: 0.3 mV

## 2020-08-16 MED ORDER — APIXABAN 5 MG PO TABS: 5.0000 mg | ORAL_TABLET | Freq: Two times a day (BID) | ORAL | 3 refills | Status: DC

## 2020-08-16 NOTE — Patient Instructions (Signed)
Stop aspirin  Start Eliquis 5 mg twice a day.

## 2020-08-16 NOTE — Progress Notes (Signed)
Primary Care Physician: Marie Cruel, MD Referring Physician: Dr. Nat[facility] Ramos clinic   Marie Ramos is a 65 y.o. female with a h/o cardiac arrest 08/24/15,   AED was immediately applied, shock was recommended and  received 2 shocks and CPR, EMS found her in PEA with ROSC after epinephrine, also HTN, LV dysfunction, prior CVA and DVT, in afib clinic for recent several episodes of  AFib via remote device check. Pt was unaware.   I had a tele visit in spring of 2020 when afib was first noted, she and her husband would not consider anticoagulation on that visit.  Since then on recent past office visits, she again declined anticoagulation.   After  benefit  vrs risk of DOAC's in reference to stroke prevention, with her high CHA2DS2VASc of 8,  were again dicussed, her concerns re bruising  addressed, she is willing to start eliquis 5 mg bid. She will stop her bid ASA. She is in SR today  She had 11 episodes of AF from 07/07/20 to 07/10/20 with longest episode lasting 20 mins.   Today, she denies symptoms of palpitations, chest pain, shortness of breath, orthopnea, PND, lower extremity edema, dizziness, presyncope, syncope, or neurologic sequela. The patient is tolerating medications without difficulties and is otherwise without complaint today.   Past Medical History:  Diagnosis Date  . Bipolar 1 disorder ([place])   . Cardiac arrest ([place]) 08/24/2015  . Chronically dry eyes   . Dyspnea    r/t seasonal allergies  . Hypertrophic obstructive cardiomyopathy ([place]) 08/29/2015  . Macular degeneration   . QT prolongation 08/29/2015  . Stroke ([place])   . TBI (traumatic brain injury) [facility]) 2011   Inpatient rehab [facility]  . Ventricular fibrillation ([place]) 09/03/15   MDT ICD Dr. Rayann Heman   Past Surgical History:  Procedure Laterality Date  . ABDOMINAL HYSTERECTOMY    . CARDIAC CATHETERIZATION N/A 08/24/2015   Procedure: Left Heart Cath and Coronary Angiography;  Surgeon: Sherren Mocha, MD;  Location: [facility];  Service: Cardiovascular;  Laterality: N/A;  . EP IMPLANTABLE DEVICE N/A 09/03/2015   MDT dual chamber ICD  . OPEN REDUCTION INTERNAL FIXATION (ORIF) DISTAL RADIAL FRACTURE Left 12/01/2018   Procedure: OPEN REDUCTION INTERNAL FIXATION (ORIF) LEFT DISTAL RADIAL FRACTURE;  Surgeon: Hiram Gash, MD;  Location: [facility] ORS;  Service: Orthopedics;  Laterality: Left;  . SPLENECTOMY, TOTAL  2011  . TONSILLECTOMY      Current Outpatient Medications  Medication Sig Dispense Refill  . ALPRAZolam (XANAX) 0.25 MG tablet TAKE ONE TABLET BY MOUTH TWICE A DAY AS NEEDED FOR ANXIETY OR SLEEP 60 tablet 0  . amLODipine (NORVASC) 5 MG tablet Take 1 tablet (5 mg total) by mouth daily with breakfast. 30 tablet 0  . apixaban (ELIQUIS) 5 MG TABS tablet Take 1 tablet (5 mg total) by mouth 2 (two) times daily. 60 tablet 3  . Ascorbic Acid (VITAMIN C PO) Take 1 capsule by mouth daily.    Marland Kitchen atorvastatin (LIPITOR) 20 MG tablet Take 1 tablet (20 mg total) by mouth daily at 6 PM. 30 tablet 0  . carvedilol (COREG) 12.5 MG tablet TAKE ONE TABLET BY MOUTH TWICE DAILY WITH BREAKFAST AND DINNER (Patient taking differently: Take 12.5 mg by mouth 2 (two) times daily with a meal. BREAKFAST AND DINNER) 180 tablet 2  . famotidine (PEPCID) 20 MG tablet Take 20 mg by mouth at bedtime.    . fexofenadine (ALLEGRA) 180 MG tablet Take 180 mg by  mouth daily.    . fluticasone (FLONASE) 50 MCG/ACT nasal spray Place 1 spray into both nostrils daily. Use as directed    . furosemide (LASIX) 20 MG tablet TAKE ONE TABLET BY MOUTH ONE TIME DAILY  (Patient taking differently: Take 20 mg by mouth daily.) 90 tablet 2  . losartan (COZAAR) 100 MG tablet Take 1 tablet (100 mg total) by mouth daily. 90 tablet 3  . Multiple Vitamin (MULTIVITAMIN WITH MINERALS) TABS tablet Take 1 tablet by mouth daily.    . Multiple Vitamins-Minerals (OCUVITE PO) Take 1 capsule by mouth daily.    . Oxcarbazepine (TRILEPTAL) 300 MG tablet TAKE ONE AND ONE-HALF  TABLETS BY MOUTH TWICE DAILY 270 tablet 0  . Risankizumab-rzaa (SKYRIZI, 150 MG DOSE, ) Inject into the skin every 3 (three) months.    . triamcinolone ointment (KENALOG) 0.5 % Apply 1 application topically 2 (two) times daily.   0  . venlafaxine XR (EFFEXOR-XR) 75 MG 24 hr capsule Take 2 capsules (150 mg total) by mouth daily with breakfast. (Patient taking differently: Take 150 mg by mouth at bedtime.) 180 capsule 1   No current facility-administered medications for this encounter.    Allergies  Allergen Reactions  . Sulfa Antibiotics Itching    Face neck red  . Other Hives  . Sulfamethoxazole-Trimethoprim Hives  . Vicodin Hp [Hydrocodone-Acetaminophen] Hives  . Naproxen Itching  . Vicodin [Hydrocodone-Acetaminophen] Itching    Social History   Socioeconomic History  . Marital status: Married    Spouse name: Not on file  . Number of children: 2  . Years of education: Not on file  . Highest education level: Master's degree (e.g., MA, MS, MEng, MEd, MSW, MBA)  Occupational History  . Occupation: substitute Product manager: [employer]  Tobacco Use  . Smoking status: Never Smoker  . Smokeless tobacco: Never Used  Vaping Use  . Vaping Use: Never used  Substance and Sexual Activity  . Alcohol use: No  . Drug use: No  . Sexual activity: Yes    Birth control/protection: Surgical  Other Topics Concern  . Not on file  Social History Narrative   Married to Marie Ramos for 71 years 60st and only marriage.   2 kids, Marie Ramos is 37, and Marie Ramos is 43.      Has MBA in human resources but always wanted to teach.  Taught some business courses in past, but likes sub teaching.  High school or either pre-K.        From [place]. Moved here in 1997 b/c of husband's job at [employer].  Pt's son lives in [place]. Dtr lives in [place].  Has 1 granddaughter, 67 yo.      Grew up in home w/ both parents and sister.  Never abused.       Never legal issues except declared  bankruptcy.      Caffeine 1 cup coffee. Or 1 coke a day.      Is Jewish.      Is on Disability for Mental health and heart issues, for 2 years.          Social Determinants of Health   Financial Resource Strain: Not on file  Food Insecurity: Not on file  Transportation Needs: Not on file  Physical Activity: Not on file  Stress: Not on file  Social Connections: Not on file  Intimate Partner Violence: Not on file    Family History  Problem Relation Age of Onset  .  Cancer Mother   . Depression Mother   . CAD Mother   . COPD Father   . Depression Father   . Diabetes Maternal Grandfather   . Depression Maternal Grandfather   . Arthritis/Rheumatoid Paternal Grandmother     ROS- All systems are reviewed and negative except as per the HPI above  Physical Exam: Vitals:   08/16/20 1426  BP: (!) 176/96  Pulse: (!) 103  Weight: 121.1 kg  Height: 5\' 2"  (1.575 m)   Wt Readings from Last 3 Encounters:  08/16/20 121.1 kg  02/13/20 107 kg  08/16/19 117.9 kg    Labs: Lab Results  Component Value Date   NA 140 08/21/2019   K 5.0 08/21/2019   CL 98 08/21/2019   CO2 33 (H) 08/21/2019   GLUCOSE 112 (H) 08/21/2019   BUN 40 (H) 08/21/2019   CREATININE 0.91 08/21/2019   CALCIUM 9.6 08/21/2019   PHOS 4.1 08/31/2015   MG 2.1 06/23/2016   Lab Results  Component Value Date   INR 1.09 09/03/2015   Lab Results  Component Value Date   CHOL 225 (H) 09/03/2015   HDL 43 09/03/2015   LDLCALC 115 (H) 09/03/2015   TRIG 128 08/16/2019     GEN- The patient is well appearing, alert and oriented x 3 today.   Head- normocephalic, atraumatic Eyes-  Sclera clear, conjunctiva pink Ears- hearing intact Oropharynx- clear Neck- supple, no JVP Lymph- no cervical lymphadenopathy Lungs- Clear to ausculation bilaterally, normal work of breathing Heart- Regular rate and rhythm, no murmurs, rubs or gallops, PMI not laterally displaced GI- soft, NT, ND, + BS Extremities- no clubbing,  cyanosis, or edema MS- no significant deformity or atrophy Skin- no rash or lesion Psych- euthymic mood, full affect Neuro- strength and sensation are intact  EKG-sinus tach with first degree AV block at 103 bpm, pr int 210 ms, qrs int 90 ms, qtc 479 ms   Device note- 07/10/20- Patient called due to alert for ongoing AF on  07/08/20 transmission at 2220. Patient reports she has not had any edema in BLE, CP, chest pressure, dizziness, syncope , or SOB. She reports that she has been sleeping more but has not noticed any increased fatigue. Remote transmission sent and reviewed that showed she is no longer in AF. She had 11 episodes of AF from 07/07/20 to transmission on 1/4/22with longest episode lasting 20 minutes, 49 seconds, no OAC. Will schedule with AF clinic and she is due for F/U with Enedina Finner PA next month. Patient will expect call from AF clinic for appointment. ( pt was delayed coming to clinic as she kept moving appointment dates)  Echo-1. Left ventricular ejection fraction, by estimation, is 65 to 70%. The left ventricle has normal function. There is moderately increased left ventricular hypertrophy of the basal septum. LV diastolic function not assessed. 2. Right ventricular systolic function was not well visualized. The right ventricular size is not well visualized. Tricuspid regurgitation signal is inadequate for assessing PA pressure. 3. The mitral valve is normal in structure and function. no evidence of mitral valve regurgitation. No evidence of mitral stenosis. 4. The aortic valve was not well visualized. Aortic valve regurgitation is trivial. 5. The inferior vena cava is normal in size with <50% respiratory variability, suggesting right atrial pressure of 8 mmHg. 6. No left atrial/left atrial appendage thrombus was detected. Conclusion(s)/Recomendation(s): Poor windows for evaluation of left ventricular function regional wall motion by transthoracic echocardiography. Would recommend  limited study with definity if clinically  indicated  Assessment and Plan: 1. Paroxysmal afib  Asymptomatic  Pt is in SR today  Continue carvedilol 12.5 mg bid  This Ramos need to be increased in the future if more afib burden  2. CHA2DS2VASc score of 8  Pt has been resistant to take anticoagulation in the past  I discussed risk vrs benefit of DOAC's, bleeding precautions and she is willing to take  Start eliquis 5 mg bid, 30 day free card given  She will stop asa 81 mg bid  States steady on her feet   3. HTN Elevated today  States white coat hypertension   4. Cardiac arrest /ICD/long qt  Per Dr. Rayann Heman and device clinic Would avoid qt prolongation drugs with her history   I will see back in 10 days and f/u with Tommye Standard, PA in 6 weeks   Butch Penny C. Jonothan Heberle, [facility] [address] [place], [place] [address] [phone]

## 2020-08-17 NOTE — Progress Notes (Signed)
EPIC Encounter for ICM Monitoring  Patient Name: Marie Ramos is a 65 y.o. female Date: 08/17/2020 Primary Care Physican: Lawerance Cruel, MD Primary Cardiologist: Allred Electrophysiologist: Allred 12/29/2021Weight:238lbs  Spoke with patient and reports feeling well at this time.  Denies fluid symptoms.    Optivol thoracic impedancenormal.  Prescribed:Furosemide 20 mg 1 tablet daily  Labs: 08/21/2019 Creatinine0.91, BUN40, Potassium5.0, Sodium140, GFR>60 08/20/2019 Creatinine0.80, BUN43, Potassium4.7, Sodium141, GFR>60  08/19/2019 Creatinine1.20, BUN41, Potassium4.4, BFXOVA919, TYO06-00  02/11/2021Creatinine1.27BUN 37, Potassium3.9, KHTXHF414, GFR45-52  02/10/2021Creatinine 1.18, BUN23, Potassium4.2, ELTRVU023, XID56-86 02/09/2021Creatinine 1.19, BUN23, Potassium4.1, HUOHFG902, XJD55-20 A complete set of results can be found in Results Review.  Recommendations:No changes and encouraged to call if experiencing any fluid symptoms.  Follow-up plan: ICM clinic phone appointment on3/14/2022. 91 day device clinic remote transmission5/06/2021.   EP/Cardiology Office Visits: Recall 2/5/2022with Newt Lukes.   Copy of ICM check sent to Dr.Allred.   3 month ICM trend: 08/16/2020.    1 Year ICM trend:       [name], RN 08/17/2020 12:51 PM

## 2020-08-22 NOTE — Progress Notes (Signed)
Remote ICD transmission.   

## 2020-08-27 ENCOUNTER — Encounter: Payer: Self-pay | Admitting: Physician Assistant

## 2020-08-27 ENCOUNTER — Ambulatory Visit (INDEPENDENT_AMBULATORY_CARE_PROVIDER_SITE_OTHER): Payer: Medicare HMO | Admitting: Physician Assistant

## 2020-08-27 ENCOUNTER — Other Ambulatory Visit: Payer: Self-pay

## 2020-08-27 DIAGNOSIS — F411 Generalized anxiety disorder: Secondary | ICD-10-CM | POA: Diagnosis not present

## 2020-08-27 DIAGNOSIS — F99 Mental disorder, not otherwise specified: Secondary | ICD-10-CM | POA: Diagnosis not present

## 2020-08-27 DIAGNOSIS — F319 Bipolar disorder, unspecified: Secondary | ICD-10-CM | POA: Diagnosis not present

## 2020-08-27 DIAGNOSIS — F5105 Insomnia due to other mental disorder: Secondary | ICD-10-CM | POA: Diagnosis not present

## 2020-08-27 MED ORDER — ALPRAZOLAM 0.25 MG PO TABS: ORAL_TABLET | ORAL | 5 refills | Status: DC

## 2020-08-27 MED ORDER — OXCARBAZEPINE 300 MG PO TABS: 450.0000 mg | ORAL_TABLET | Freq: Two times a day (BID) | ORAL | 1 refills | Status: DC

## 2020-08-27 NOTE — Progress Notes (Signed)
[facility]  Patient ID: Marie Ramos,  MRN: [id number]  PCP: Lawerance Cruel, MD  Date of Evaluation: 08/27/2020 Time spent:20 minutes  Chief Complaint:  Chief Complaint    Anxiety; Depression; Insomnia      HISTORY/CURRENT STATUS: HPI For routine med check.  Manus Gunning is doing well. Meds are working like they should. Her son got married in [place] recently, she didn't go. Very happy for them and shows me pics. 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 Moving to a new apartment soon and looking forward to it.   She is able to enjoy things.  Energy and motivation are good.  Appetite is normal without increase or decrease in weight.  Not crying easily. Not isolating.  Denies suicidal or homicidal thoughts.  Patient denies increased energy with decreased need for sleep, no increased talkativeness, no racing thoughts, no impulsivity or risky behaviors, no increased spending, no increased libido, no grandiosity, no hallucinations.  Anxiety is well controlled.  States she wouldn't be able to fall asleep if she didn't have a xanax b/c her mind races.  Occasionally has anxiety during the day but not often.  Denies dizziness, syncope, seizures, numbness, tingling, tremor, tics, unsteady gait, slurred speech, confusion. Denies muscle or joint pain, stiffness, or dystonia.  Individual Medical History/ Review of Systems: Changes? :No     Past medications for mental health diagnoses include: Lithium, Trileptal, Effexor XR,  Allergies: Sulfa antibiotics, Other, Sulfamethoxazole-trimethoprim, Vicodin hp [hydrocodone-acetaminophen], Naproxen, and Vicodin [hydrocodone-acetaminophen]  Current Medications:  Current Outpatient Medications:  .  amLODipine (NORVASC) 5 MG tablet, Take 1 tablet (5 mg total) by mouth daily with breakfast., Disp: 30 tablet, Rfl: 0 .  apixaban (ELIQUIS) 5 MG TABS tablet, Take 1 tablet (5 mg total) by mouth 2 (two) times daily., Disp: 60  tablet, Rfl: 3 .  Ascorbic Acid (VITAMIN C PO), Take 1 capsule by mouth daily., Disp: , Rfl:  .  atorvastatin (LIPITOR) 20 MG tablet, Take 1 tablet (20 mg total) by mouth daily at 6 PM., Disp: 30 tablet, Rfl: 0 .  carvedilol (COREG) 12.5 MG tablet, TAKE ONE TABLET BY MOUTH TWICE DAILY WITH BREAKFAST AND DINNER (Patient taking differently: Take 12.5 mg by mouth 2 (two) times daily with a meal. BREAKFAST AND DINNER), Disp: 180 tablet, Rfl: 2 .  famotidine (PEPCID) 20 MG tablet, Take 20 mg by mouth at bedtime., Disp: , Rfl:  .  fexofenadine (ALLEGRA) 180 MG tablet, Take 180 mg by mouth daily., Disp: , Rfl:  .  fluticasone (FLONASE) 50 MCG/ACT nasal spray, Place 1 spray into both nostrils daily. Use as directed, Disp: , Rfl:  .  furosemide (LASIX) 20 MG tablet, TAKE ONE TABLET BY MOUTH ONE TIME DAILY  (Patient taking differently: Take 20 mg by mouth daily.), Disp: 90 tablet, Rfl: 2 .  losartan (COZAAR) 100 MG tablet, Take 1 tablet (100 mg total) by mouth daily., Disp: 90 tablet, Rfl: 3 .  Multiple Vitamin (MULTIVITAMIN WITH MINERALS) TABS tablet, Take 1 tablet by mouth daily., Disp: , Rfl:  .  Multiple Vitamins-Minerals (OCUVITE PO), Take 1 capsule by mouth daily., Disp: , Rfl:  .  Risankizumab-rzaa (SKYRIZI, 150 MG DOSE, [place]), Inject into the skin every 3 (three) months., Disp: , Rfl:  .  triamcinolone ointment (KENALOG) 0.5 %, Apply 1 application topically 2 (two) times daily. , Disp: , Rfl: 0 .  venlafaxine XR (EFFEXOR-XR) 75 MG 24 hr capsule, Take 2 capsules (150 mg total) by  mouth daily with breakfast. (Patient taking differently: Take 150 mg by mouth at bedtime.), Disp: 180 capsule, Rfl: 1 .  ALPRAZolam (XANAX) 0.25 MG tablet, TAKE ONE TABLET BY MOUTH TWICE A DAY AS NEEDED FOR ANXIETY OR SLEEP, Disp: 60 tablet, Rfl: 5 .  Oxcarbazepine (TRILEPTAL) 300 MG tablet, Take 1.5 tablets (450 mg total) by mouth 2 (two) times daily., Disp: 270 tablet, Rfl: 1 Medication Side Effects: none  Family Medical/  Social History: Changes? No  MENTAL HEALTH EXAM:  There were no vitals taken for this visit.There is no height or weight on file to calculate BMI.  General Appearance: Casual, Neat, Well Groomed and Obese  Eye Contact:  Good  Speech:  Clear and Coherent and Normal Rate  Volume:  Normal  Mood:  Euthymic  Affect:  Appropriate  Thought Process:  Goal Directed and Descriptions of Associations: Intact  Orientation:  Full (Time, Place, and Person)  Thought Content: Logical   Suicidal Thoughts:  No  Homicidal Thoughts:  No  Memory:  WNL  Judgement:  Good  Insight:  Good  Psychomotor Activity:  Normal  Concentration:  Concentration: Good  Recall:  Good  Fund of Knowledge: Good  Language: Good  Assets:  Desire for Improvement  ADL's:  Intact  Cognition: WNL  Prognosis:  Good    DIAGNOSES:    ICD-10-CM   1. Bipolar I disorder ([place])  F31.9   2. Generalized anxiety disorder  F41.1   3. Insomnia due to other mental disorder  F51.05    F99     Receiving Psychotherapy: No    RECOMMENDATIONS:  PDMP reviewed. I provided 20 minutes of face to face time during this encounter discussing her response to meds. She's doing great so no med changes are necessary. Continue Effexor XR 75 mg, 2 p.o. nightly. Continue Trileptal 300 mg, 1.5 pills p.o. twice daily. Continue Xanax 0.25 mg, 1 p.o. nightly as needed sleep or anxiety. Return in 6 months.  Donnal Moat, PA-C

## 2020-08-29 ENCOUNTER — Encounter (HOSPITAL_COMMUNITY): Payer: Self-pay | Admitting: Nurse Practitioner

## 2020-08-29 ENCOUNTER — Ambulatory Visit (HOSPITAL_COMMUNITY)
Admission: RE | Admit: 2020-08-29 | Discharge: 2020-08-29 | Disposition: A | Discharge status: Home / Self Care | Payer: Medicare HMO | Source: Ambulatory Visit | Attending: Nurse Practitioner | Admitting: Nurse Practitioner

## 2020-08-29 ENCOUNTER — Other Ambulatory Visit: Payer: Self-pay

## 2020-08-29 VITALS — BP 138/80 | HR 80 | Ht 62.0 in | Wt 271.2 lb

## 2020-08-29 DIAGNOSIS — Z8674 Personal history of sudden cardiac arrest: Secondary | ICD-10-CM | POA: Diagnosis not present

## 2020-08-29 DIAGNOSIS — Z7901 Long term (current) use of anticoagulants: Secondary | ICD-10-CM | POA: Diagnosis not present

## 2020-08-29 DIAGNOSIS — Z79899 Other long term (current) drug therapy: Secondary | ICD-10-CM | POA: Insufficient documentation

## 2020-08-29 DIAGNOSIS — I10 Essential (primary) hypertension: Secondary | ICD-10-CM | POA: Insufficient documentation

## 2020-08-29 DIAGNOSIS — I480 Paroxysmal atrial fibrillation: Secondary | ICD-10-CM | POA: Diagnosis not present

## 2020-08-29 DIAGNOSIS — Z95810 Presence of automatic (implantable) cardiac defibrillator: Secondary | ICD-10-CM | POA: Diagnosis not present

## 2020-08-29 DIAGNOSIS — I440 Atrioventricular block, first degree: Secondary | ICD-10-CM | POA: Diagnosis not present

## 2020-08-29 NOTE — Progress Notes (Signed)
Primary Care Physician: Marie Cruel, MD Referring Physician: Dr. [facility]   Marie Ramos is a 65 y.o. female with a h/o cardiac arrest 08/24/15,   AED was immediately applied, shock was recommended and  received 2 shocks and CPR, EMS found her in PEA with ROSC after epinephrine, also HTN, LV dysfunction, prior CVA and DVT, in afib clinic for recent several episodes of  AFib via remote device check. Pt was unaware.   I had a tele visit in spring of 2020 when afib was first noted, she and her husband would not consider anticoagulation on that visit.  Since then on recent past office visits, she again declined anticoagulation.   After  benefit  vrs risk of DOAC's in reference to stroke prevention, with her high CHA2DS2VASc of 8,  were again dicussed, her concerns re bruising  addressed, she is willing to start eliquis 5 mg bid. She will stop her bid ASA. She is in SR today  She had 11 episodes of AF from 07/07/20 to 07/10/20 with longest episode lasting 20 mins.   F/u in afib clinic, 08/29/20. She has been on anticoagulation x 2 weeks and feels well. No bleeding issues. In SR, she has not noted any afib.   Today, she denies symptoms of palpitations, chest pain, shortness of breath, orthopnea, PND, lower extremity edema, dizziness, presyncope, syncope, or neurologic sequela. The patient is tolerating medications without difficulties and is otherwise without complaint today.   Past Medical History:  Diagnosis Date  . Bipolar 1 disorder ([place])   . Cardiac arrest ([place]) 08/24/2015  . Chronically dry eyes   . Dyspnea    r/t seasonal allergies  . Hypertrophic obstructive cardiomyopathy ([place]) 08/29/2015  . Macular degeneration   . QT prolongation 08/29/2015  . Stroke ([place])   . TBI (traumatic brain injury) Marie Ramos) 2011   Inpatient rehab [facility]  . Ventricular fibrillation ([place]) 09/03/15   MDT ICD Dr. Rayann Ramos   Past Surgical History:  Procedure Laterality Date  . ABDOMINAL HYSTERECTOMY     . CARDIAC CATHETERIZATION N/A 08/24/2015   Procedure: Left Heart Cath and Coronary Angiography;  Surgeon: Marie Mocha, MD;  Location: [facility];  Service: Cardiovascular;  Laterality: N/A;  . EP IMPLANTABLE DEVICE N/A 09/03/2015   MDT dual chamber ICD  . OPEN REDUCTION INTERNAL FIXATION (ORIF) DISTAL RADIAL FRACTURE Left 12/01/2018   Procedure: OPEN REDUCTION INTERNAL FIXATION (ORIF) LEFT DISTAL RADIAL FRACTURE;  Surgeon: Marie Gash, MD;  Location: [facility];  Service: Orthopedics;  Laterality: Left;  . SPLENECTOMY, TOTAL  2011  . TONSILLECTOMY      Current Outpatient Medications  Medication Sig Dispense Refill  . ALPRAZolam (XANAX) 0.25 MG tablet TAKE ONE TABLET BY MOUTH TWICE A DAY AS NEEDED FOR ANXIETY OR SLEEP 60 tablet 5  . amLODipine (NORVASC) 5 MG tablet Take 1 tablet (5 mg total) by mouth daily with breakfast. 30 tablet 0  . apixaban (ELIQUIS) 5 MG TABS tablet Take 1 tablet (5 mg total) by mouth 2 (two) times daily. 60 tablet 3  . Ascorbic Acid (VITAMIN C PO) Take 1 capsule by mouth daily.    Marland Kitchen atorvastatin (LIPITOR) 20 MG tablet Take 1 tablet (20 mg total) by mouth daily at 6 PM. 30 tablet 0  . carvedilol (COREG) 12.5 MG tablet TAKE ONE TABLET BY MOUTH TWICE DAILY WITH BREAKFAST AND DINNER 180 tablet 2  . DENTAGEL 1.1 % GEL dental gel     . famotidine (PEPCID) 20  MG tablet Take 20 mg by mouth at bedtime.    . fexofenadine (ALLEGRA) 180 MG tablet Take 180 mg by mouth daily.    . fluticasone (FLONASE) 50 MCG/ACT nasal spray Place 1 spray into both nostrils daily. Use as directed    . furosemide (LASIX) 20 MG tablet TAKE ONE TABLET BY MOUTH ONE TIME DAILY  90 tablet 2  . losartan (COZAAR) 100 MG tablet Take 1 tablet (100 mg total) by mouth daily. 90 tablet 3  . Multiple Vitamin (MULTIVITAMIN WITH MINERALS) TABS tablet Take 1 tablet by mouth daily.    . Multiple Vitamins-Minerals (OCUVITE PO) Take 1 capsule by mouth daily.    . Oxcarbazepine (TRILEPTAL) 300 MG tablet Take  1.5 tablets (450 mg total) by mouth 2 (two) times daily. 270 tablet 1  . Risankizumab-rzaa (SKYRIZI, 150 MG DOSE, [place]) Inject into the skin every 3 (three) months.    . traMADol (ULTRAM) 50 MG tablet See admin instructions.    . triamcinolone ointment (KENALOG) 0.5 % Apply 1 application topically 2 (two) times daily.   0  . venlafaxine XR (EFFEXOR-XR) 75 MG 24 hr capsule Take 2 capsules (150 mg total) by mouth daily with breakfast. 180 capsule 1   No current facility-administered medications for this encounter.    Allergies  Allergen Reactions  . Sulfa Antibiotics Itching    Face neck red  . Other Hives  . Sulfamethoxazole-Trimethoprim Hives  . Vicodin Hp [Hydrocodone-Acetaminophen] Hives  . Naproxen Itching  . Vicodin [Hydrocodone-Acetaminophen] Itching    Social History   Socioeconomic History  . Marital status: Married    Spouse name: Not on file  . Number of children: 2  . Years of education: Not on file  . Highest education level: Master's degree (e.g., MA, MS, MEng, MEd, MSW, MBA)  Occupational History  . Occupation: substitute Product manager: [employer]  Tobacco Use  . Smoking status: Never Smoker  . Smokeless tobacco: Never Used  Vaping Use  . Vaping Use: Never used  Substance and Sexual Activity  . Alcohol use: No  . Drug use: No  . Sexual activity: Yes    Birth control/protection: Surgical  Other Topics Concern  . Not on file  Social History Narrative   Married to Marie Ramos for 30 years 15st and only marriage.   2 kids, Marie Ramos is 64, and Marie Ramos is 82.      Has MBA in human resources but always wanted to teach.  Taught some business courses in past, but likes sub teaching.  High school or either pre-K.        From [place]. Moved here in 1997 b/c of husband's job at [employer].  Pt's son lives in [place]. Dtr lives in [place].  Has 1 granddaughter, 37 yo.      Grew up in home w/ both parents and sister.  Never abused.       Never legal  issues except declared bankruptcy.      Caffeine 1 cup coffee. Or 1 coke a day.      Is Jewish.      Is on Disability for Mental health and heart issues, for 2 years.          Social Determinants of Health   Financial Resource Strain: Not on file  Food Insecurity: Not on file  Transportation Needs: Not on file  Physical Activity: Not on file  Stress: Not on file  Social Connections: Not on file  Intimate  Partner Violence: Not on file    Family History  Problem Relation Age of Onset  . Cancer Mother   . Depression Mother   . CAD Mother   . COPD Father   . Depression Father   . Diabetes Maternal Grandfather   . Depression Maternal Grandfather   . Arthritis/Rheumatoid Paternal Grandmother     ROS- All systems are reviewed and negative except as per the HPI above  Physical Exam: Vitals:   08/29/20 1536  BP: 138/80  Pulse: 80  Weight: 123 kg  Height: 5\' 2"  (1.575 m)   Wt Readings from Last 3 Encounters:  08/29/20 123 kg  08/16/20 121.1 kg  02/13/20 107 kg    Labs: Lab Results  Component Value Date   NA 140 08/21/2019   K 5.0 08/21/2019   CL 98 08/21/2019   CO2 33 (H) 08/21/2019   GLUCOSE 112 (H) 08/21/2019   BUN 40 (H) 08/21/2019   CREATININE 0.91 08/21/2019   CALCIUM 9.6 08/21/2019   PHOS 4.1 08/31/2015   MG 2.1 06/23/2016   Lab Results  Component Value Date   INR 1.09 09/03/2015   Lab Results  Component Value Date   CHOL 225 (H) 09/03/2015   HDL 43 09/03/2015   LDLCALC 115 (H) 09/03/2015   TRIG 128 08/16/2019     GEN- The patient is well appearing, alert and oriented x 3 today.   Head- normocephalic, atraumatic Eyes-  Sclera clear, conjunctiva pink Ears- hearing intact Oropharynx- clear Neck- supple, no JVP Lymph- no cervical lymphadenopathy Lungs- Clear to ausculation bilaterally, normal work of breathing Heart- Regular rate and rhythm, no murmurs, rubs or gallops, PMI not laterally displaced GI- soft, NT, ND, + BS Extremities- no  clubbing, cyanosis, or edema MS- no significant deformity or atrophy Skin- no rash or lesion Psych- euthymic mood, full affect Neuro- strength and sensation are intact  EKG-NSR with first degree AV block at 80 bpm, pr int 236 ms, qrs int 96 ms, qtc 477 ms   Device note- 07/10/20- Patient called due to alert for ongoing AF on  07/08/20 transmission at 2220. Patient reports she has not had any edema in BLE, CP, chest pressure, dizziness, syncope , or SOB. She reports that she has been sleeping more but has not noticed any increased fatigue. Remote transmission sent and reviewed that showed she is no longer in AF. She had 11 episodes of AF from 07/07/20 to transmission on 1/4/22with longest episode lasting 20 minutes, 49 seconds, no OAC. Will schedule with AF clinic and she is due for F/U with Charlcie Cradle PA next month. Patient will expect call from AF clinic for appointment. ( pt was delayed coming to clinic as she kept moving appointment dates)  Echo-1. Left ventricular ejection fraction, by estimation, is 65 to 70%. The left ventricle has normal function. There is moderately increased left ventricular hypertrophy of the basal septum. LV diastolic function not assessed. 2. Right ventricular systolic function was not well visualized. The right ventricular size is not well visualized. Tricuspid regurgitation signal is inadequate for assessing PA pressure. 3. The mitral valve is normal in structure and function. no evidence of mitral valve regurgitation. No evidence of mitral stenosis. 4. The aortic valve was not well visualized. Aortic valve regurgitation is trivial. 5. The inferior vena cava is normal in size with <50% respiratory variability, suggesting right atrial pressure of 8 mmHg. 6. No left atrial/left atrial appendage thrombus was detected. Conclusion(s)/Recomendation(s): Poor windows for evaluation of left ventricular function  regional wall motion by transthoracic echocardiography. Would recommend  limited study with definity if clinically indicated  Assessment and Plan: 1. Paroxysmal afib  Asymptomatic  Pt is in SR today  Continue carvedilol 12.5 mg bid  This Ramos need to be increased in the future if more afib burden  2. CHA2DS2VASc score of 8  Pt has been resistant to take anticoagulation in the past  Continue eliquis 5 mg bid  Off asa      3. HTN Stable   4. Cardiac arrest /ICD/long qt  Per Dr. Rayann Ramos and device clinic Would avoid qt prolongation drugs with her history   F/u with [facility] in 4 weeks Will need CBC at that visit with start of anticoagulation   Butch Penny C. Katanya Schlie, [place] [facility] [address], [place] [address] [phone]

## 2020-09-17 ENCOUNTER — Ambulatory Visit (INDEPENDENT_AMBULATORY_CARE_PROVIDER_SITE_OTHER): Payer: Medicare HMO

## 2020-09-17 DIAGNOSIS — I5032 Chronic diastolic (congestive) heart failure: Secondary | ICD-10-CM | POA: Diagnosis not present

## 2020-09-17 DIAGNOSIS — Z95810 Presence of automatic (implantable) cardiac defibrillator: Secondary | ICD-10-CM

## 2020-09-19 NOTE — Progress Notes (Signed)
EPIC Encounter for ICM Monitoring  Patient Name: Marie Ramos is a 65 y.o. female Date: 09/19/2020 Primary Care Physican: Lawerance Cruel, MD Primary Cardiologist: Allred Electrophysiologist: Allred 3/16/2022Weight:240lbs  Spoke with patient and reports feeling well at this time.  Denies fluid symptoms.    Optivol thoracic impedancenormal.  Prescribed:Furosemide 20 mg 1 tablet daily  Labs: 08/21/2019 Creatinine0.91, BUN40, Potassium5.0, Sodium140, GFR>60 08/20/2019 Creatinine0.80, BUN43, Potassium4.7, Sodium141, GFR>60  08/19/2019 Creatinine1.20, BUN41, Potassium4.4, [id number], [id number]  02/11/2021Creatinine1.27BUN 37, Potassium3.9, [id number], GFR45-52  02/10/2021Creatinine 1.18, BUN23, Potassium4.2, [id number], [id number] 02/09/2021Creatinine 1.19, BUN23, Potassium4.1, [id number], [id number] A complete set of results can be found in Results Review.  Recommendations: No changes and encouraged to call if experiencing any fluid symptoms.  Follow-up plan: ICM clinic phone appointment on4/18/2022. 91 day device clinic remote transmission5/06/2021.   EP/Cardiology Office Visits: 3/25/2022with [facility].   Copy of ICM check sent to Dr.Allred.  3 month ICM trend: 09/17/2020.    1 Year ICM trend:       [name], RN 09/19/2020 10:09 AM

## 2020-09-28 ENCOUNTER — Encounter: Payer: Self-pay | Admitting: Physician Assistant

## 2020-09-28 ENCOUNTER — Other Ambulatory Visit: Payer: Self-pay

## 2020-09-28 ENCOUNTER — Ambulatory Visit: Payer: Medicare HMO | Admitting: Physician Assistant

## 2020-09-28 VITALS — BP 140/70 | HR 102 | Ht 62.5 in | Wt 270.2 lb

## 2020-09-28 DIAGNOSIS — I469 Cardiac arrest, cause unspecified: Secondary | ICD-10-CM

## 2020-09-28 DIAGNOSIS — I10 Essential (primary) hypertension: Secondary | ICD-10-CM

## 2020-09-28 DIAGNOSIS — I5032 Chronic diastolic (congestive) heart failure: Secondary | ICD-10-CM | POA: Diagnosis not present

## 2020-09-28 DIAGNOSIS — I480 Paroxysmal atrial fibrillation: Secondary | ICD-10-CM

## 2020-09-28 DIAGNOSIS — Z95810 Presence of automatic (implantable) cardiac defibrillator: Secondary | ICD-10-CM

## 2020-09-28 DIAGNOSIS — Z79899 Other long term (current) drug therapy: Secondary | ICD-10-CM | POA: Diagnosis not present

## 2020-09-28 DIAGNOSIS — L400 Psoriasis vulgaris: Secondary | ICD-10-CM | POA: Diagnosis not present

## 2020-09-28 DIAGNOSIS — I5021 Acute systolic (congestive) heart failure: Secondary | ICD-10-CM | POA: Diagnosis not present

## 2020-09-28 LAB — CUP PACEART INCLINIC DEVICE CHECK
Battery Remaining Longevity: 51 mo
Battery Voltage: 2.97 V
Brady Statistic AP VP Percent: 0 %
Brady Statistic AP VS Percent: 0.07 %
Brady Statistic AS VP Percent: 0.05 %
Brady Statistic AS VS Percent: 99.88 %
Brady Statistic RA Percent Paced: 0.08 %
Brady Statistic RV Percent Paced: 0.05 %
Date Time Interrogation Session: 20220325171033
HighPow Impedance: 88 Ohm
Implantable Lead Implant Date: 20170227
Implantable Lead Implant Date: 20170227
Implantable Lead Location: 753859
Implantable Lead Location: 753860
Implantable Lead Model: 5076
Implantable Pulse Generator Implant Date: 20170227
Lead Channel Impedance Value: 361 Ohm
Lead Channel Impedance Value: 418 Ohm
Lead Channel Impedance Value: 513 Ohm
Lead Channel Pacing Threshold Amplitude: 0.5 V
Lead Channel Pacing Threshold Amplitude: 0.5 V
Lead Channel Pacing Threshold Pulse Width: 0.4 ms
Lead Channel Pacing Threshold Pulse Width: 0.4 ms
Lead Channel Sensing Intrinsic Amplitude: 24 mV
Lead Channel Sensing Intrinsic Amplitude: 24.75 mV
Lead Channel Sensing Intrinsic Amplitude: 3.25 mV
Lead Channel Sensing Intrinsic Amplitude: 3.5 mV
Lead Channel Setting Pacing Amplitude: 2 V
Lead Channel Setting Pacing Amplitude: 2.5 V
Lead Channel Setting Pacing Pulse Width: 0.4 ms
Lead Channel Setting Sensing Sensitivity: 0.3 mV

## 2020-09-28 NOTE — Progress Notes (Signed)
Patient ID: Marie Ramos, female   DOB: February 08, 1956, 65 y.o.   MRN: [id number]    Cardiology Office Note Date:  09/28/2020  Patient ID:  Marie, Ramos 03/27/1956, MRN [id number] PCP:  Lawerance Cruel, MD  Cardiologist:  None prior to hospital, (new) Dr. Burt Knack Electrophysiologist: Dr. Rayann Heman    Chief Complaint:  6 mo visit   History of Present Illness: Marie Ramos is a 65 y.o. female with history of cardiac arrest 08/24/15, she was at work, AED was immediately applied and recommended and received 2 shocks and CPR, FR found her in PEA with ROSC after epinephrine, very recently found to have AFib via remote device check.   08/24/15: presumd VF arrest with AED advising shocks, CPR, PEA , she had emergent cardiac cath with no CAD, long QT on her EKG noted. Hypokalemic noted as well.   She underwent hypothermia protocol.  Suspected her psych meds were the etiology pf her QT prolongation.  Echo suggested possible HCM,, MRI revealed hypertensive CM, not HCM.  MRI 2/26 noted CVA, neuro on case, possibly related to cath procedure, not recommended to have TEE or further w/u.  She underwent ICD implant  By Dr. Rayann Heman on 09/04/15.  Psychiatry was consulted and aided in her medications, to avoid QT prolonging meds. Discharged from CIR on the current regime. She developed DVT to LLE started on Eliquis.  She was discharged to CIR until 09/14/15 and has undergone out patient rehab since then.  Is now off a/c  She comes in today being seen for Dr. Rayann Heman, she was last seen by him in Jan 2019, was doing well, no changes were made to her device or therapy, planned for 6 mo f/u with APPs.    I saw her in July 2019, she was doing well.  Very active caring for her grandchild left her tired at the end of the day and sleeping well.  No CP, palpitations or SOB, no dizziness, near syncope or syncope.  No shocks.  Med list was reviewed with [facility] and confirmed no QT prolonging drugs.  No changes were  made.  12/06/2018 she was referred to the AFib clinic with a remote PPM check noting 2 days of AFib.  [name], NP had a tele-health visit though quite unsuccessful in keeping the patient on topic and despite efforts in discussing Afib, embolic risks and Pace, the patient (and her husband in the background) were unwilling to discuss.    I saw her again Aug 2020, she was doing well.  No CP, palpitations or SOB, no near syncope or syncope, no shocks.  She fell at her daughter's house (trip/fall) and broke her wrist requiring surgical repair. She had 3 AF episodes noted via her device 12/01/18 34 seconds  (day of her surgery) 5/29 had 2 episodes 38 minutes and 21 minutes Her Afib episodes occurred the day after she was discharged after her wrist surgery and she reports she was in significant pain once home We discussed embolic/stroke risk, potential risk/benefit of a/c.  She does have prior stroke, though she reports this was in the environment of her cardiac arrest, notes report suspect 2/2 LHC procedure.  She wanted to hold off on a/c.  I saw her 08/04/2019 She continues to do well.  No CP, palpitations or cardiac awareness, no SOB or DOE.  She has stairs at her town home/plaza, she walks these for exercise weather permitting.  No dizzy spells, near syncope or syncope. QT was  stable, she had 1 Aflutter episode 13 minutes, discussed role of a/c for her, discussed her stroke hx was suspect to be procedure related and uncertain if would contribute to her risk score. She was not agreeable to consideration of a/c with plans to monitor her burden.  I saw her 02/13/20 She is doing well.  Noticed she is more aware of her device (can feel it when laying down and touches her chest).  No CO, palpitations or cardiac awareness.  She has "terrible knees" and has been recommended to have them replaced, but she does not want to do.  They limit her ability to exercise, she walks some for exercise and feels like she has  good exertional capacity. No dizzy spells, no syncope.  No Shocks She was by her eye doctor not too long ago and he told her to tell us he thought she should have the artieries in her neck checked.  He did not mention any signs of blood supply issue that she understood, she had mentioned having floaters intermittently and red eyes. She is looking forward to her niece's wedding, mentioned that guests will either need to be vaccinated or have a negative test within a week of the event. BP was elevated, and was encouraged to monitor at home for a few weeks and let me know the readings for adjustment if needed in her meds.  In the interim she has been found again with AFib via her device.  She saw [name], NP 08/16/20 at the AF clinic, revisited indication for a/c and the patient agreeable to start. Doing well at a 2 week f/u without notable issues with her [place].  TODAY She is doing well. She and her husband are moving soon to a one story c ondo this will be very good for them. She denies any CP, palpitations, no cardiac awraeness. No d=SOB No dizzy spells, near syncope ro syncope, no shocks  No bleeding or signs of bleeding, tolerating a/c    Device information: MDT dual chamber ICD implanted 09/03/15, Dr. Rayann Heman, secondary prevention/VF arrest  Past Medical History:  Diagnosis Date  . Abnormality of gait   . Acute bilat watershed infarction [facility])   . Acute lower UTI   . Acute metabolic encephalopathy 07/15/2991  . Acute respiratory failure ([place])   . Acute systolic congestive heart failure ([place])   . AKI (acute kidney injury) ([place])   . Anoxic brain damage ([facility])   . Bipolar 1 disorder ([place])   . Bipolar affective disorder in remission ([place])   . Cardiac arrest ([place]) 08/24/2015  . Cerebral infarction due to embolism of cerebral artery ([place])   . Cerebrovascular accident (CVA) due to embolism of cerebral artery ([place]) 12/05/2015  . Chronically dry eyes   . Closed fracture of left distal radius  12/01/2018  . DVT (deep venous thrombosis) ([place]) 12/05/2015  . Dyspnea    r/t seasonal allergies  . Hypertrophic obstructive cardiomyopathy ([place]) 08/29/2015  . Leukocytosis   . Macular degeneration   . Pneumonia due to COVID-19 virus 08/16/2019  . Pulmonary vascular congestion 08/16/2019  . QT prolongation 08/29/2015  . Stroke ([place])   . Systolic dysfunction with acute on chronic heart failure ([place])   . TBI (traumatic brain injury) [facility]) 2011   Inpatient rehab [facility]  . Thrombocytosis   . Ventricular fibrillation ([place]) 09/03/15   MDT ICD Dr. Rayann Heman    Past Surgical History:  Procedure Laterality Date  . ABDOMINAL HYSTERECTOMY    . CARDIAC CATHETERIZATION  N/A 08/24/2015   Procedure: Left Heart Cath and Coronary Angiography;  Surgeon: Sherren Mocha, MD;  Location: [facility] CV [facility];  Service: Cardiovascular;  Laterality: N/A;  . EP IMPLANTABLE DEVICE N/A 09/03/2015   MDT dual chamber ICD  . OPEN REDUCTION INTERNAL FIXATION (ORIF) DISTAL RADIAL FRACTURE Left 12/01/2018   Procedure: OPEN REDUCTION INTERNAL FIXATION (ORIF) LEFT DISTAL RADIAL FRACTURE;  Surgeon: Hiram Gash, MD;  Location: [facility] ORS;  Service: Orthopedics;  Laterality: Left;  . SPLENECTOMY, TOTAL  2011  . TONSILLECTOMY      Current Outpatient Medications  Medication Sig Dispense Refill  . ALPRAZolam (XANAX) 0.25 MG tablet TAKE ONE TABLET BY MOUTH TWICE A DAY AS NEEDED FOR ANXIETY OR SLEEP 60 tablet 5  . amLODipine (NORVASC) 5 MG tablet Take 1 tablet (5 mg total) by mouth daily with breakfast. 30 tablet 0  . apixaban (ELIQUIS) 5 MG TABS tablet Take 1 tablet (5 mg total) by mouth 2 (two) times daily. 60 tablet 3  . Ascorbic Acid (VITAMIN C PO) Take 1 capsule by mouth daily.    Marland Kitchen atorvastatin (LIPITOR) 20 MG tablet Take 1 tablet (20 mg total) by mouth daily at 6 PM. 30 tablet 0  . carvedilol (COREG) 12.5 MG tablet TAKE ONE TABLET BY MOUTH TWICE DAILY WITH BREAKFAST AND DINNER 180 tablet 2  . DENTAGEL 1.1 % GEL dental gel     .  famotidine (PEPCID) 20 MG tablet Take 20 mg by mouth at bedtime.    . fexofenadine (ALLEGRA) 180 MG tablet Take 180 mg by mouth daily.    . fluticasone (FLONASE) 50 MCG/ACT nasal spray Place 1 spray into both nostrils daily. Use as directed    . furosemide (LASIX) 20 MG tablet TAKE ONE TABLET BY MOUTH ONE TIME DAILY  90 tablet 2  . losartan (COZAAR) 100 MG tablet Take 1 tablet (100 mg total) by mouth daily. 90 tablet 3  . Multiple Vitamin (MULTIVITAMIN WITH MINERALS) TABS tablet Take 1 tablet by mouth daily.    . Multiple Vitamins-Minerals (OCUVITE PO) Take 1 capsule by mouth daily.    . Oxcarbazepine (TRILEPTAL) 300 MG tablet Take 1.5 tablets (450 mg total) by mouth 2 (two) times daily. 270 tablet 1  . Risankizumab-rzaa (SKYRIZI, 150 MG DOSE, [place]) Inject into the skin every 3 (three) months.    . traMADol (ULTRAM) 50 MG tablet See admin instructions.    . triamcinolone ointment (KENALOG) 0.5 % Apply 1 application topically 2 (two) times daily.   0  . venlafaxine XR (EFFEXOR-XR) 75 MG 24 hr capsule Take 2 capsules (150 mg total) by mouth daily with breakfast. 180 capsule 1   No current facility-administered medications for this visit.    Allergies:   Sulfa antibiotics, Other, Sulfamethoxazole-trimethoprim, Vicodin hp [hydrocodone-acetaminophen], Naproxen, and Vicodin [hydrocodone-acetaminophen]   Social History:  The patient  reports that she has never smoked. She has never used smokeless tobacco. She reports that she does not drink alcohol and does not use drugs.   Family History:  The patient's family history includes Arthritis/Rheumatoid in her paternal grandmother; CAD in her mother; COPD in her father; Cancer in her mother; Depression in her father, maternal grandfather, and mother; Diabetes in her maternal grandfather.  ROS:  Please see the history of present illness.    All other systems are reviewed and otherwise negative.   PHYSICAL EXAM:  VS:  There were no vitals taken for this  visit. BMI: There is no height or weight on file  to calculate BMI. Well nourished, well developed, in no acute distress  HEENT: normocephalic, atraumatic  Neck: no JVD, carotid bruits or masses Cardiac: RRR; no significant murmurs, no rubs, or gallops Lungs:  CTA b/l , no wheezing, rhonchi or rales  Abd: soft, nontender, central obesity MS: no deformity or atrophy Ext:  no edema  Skin: warm and dry, psoriasis noted b/l LE Neuro:  No gross deficits appreciated Psych: euthymic mood, full affect  ICD site is stable,well healed, no tethering or discomfort   EKG:  Not done tdoay 2.23/22 personally reviewed, SR 80bpm, 1st degree AVBlock, 222ms, QTc 453ms   ICD interrogation today and reviewed by myself.  Battery and lead measurements are good No further AF no VT VS 99.9%   08/17/19: TTE IMPRESSIONS  1. Left ventricular ejection fraction, by estimation, is 65 to 70%. The  left ventricle has normal function. There is moderately increased left  ventricular hypertrophy of the basal septum. LV diastolic function not  assessed.  2. Right ventricular systolic function was not well visualized. The right  ventricular size is not well visualized. Tricuspid regurgitation signal is  inadequate for assessing PA pressure.  3. The mitral valve is normal in structure and function. no evidence of  mitral valve regurgitation. No evidence of mitral stenosis.  4. The aortic valve was not well visualized. Aortic valve regurgitation  is trivial.  5. The inferior vena cava is normal in size with <50% respiratory  variability, suggesting right atrial pressure of 8 mmHg.  6. No left atrial/left atrial appendage thrombus was detected.   Conclusion(s)/Recomendation(s): Poor windows for evaluation of left  ventricular function regional wall motion by transthoracic  echocardiography. Would recommend limited study with definity if  clinically indicated.    07/14/16: TTE Study Conclusions - Left  ventricle: The cavity size was normal. Wall thickness was   increased in a pattern of mild LVH. Systolic function was normal.   The estimated ejection fraction was in the range of 55% to 60%.   Wall motion was normal; there were no regional wall motion   abnormalities. Doppler parameters are consistent with abnormal   left ventricular relaxation (grade 1 diastolic dysfunction). - Aortic valve: There was no stenosis. - Mitral valve: Mildly calcified annulus. There was no significant   regurgitation. - Right ventricle: The cavity size was normal. Pacer wire or   catheter noted in right ventricle. Systolic function was normal. - Pulmonary arteries: No complete TR doppler jet so unable to   estimate PA systolic pressure. - Inferior vena cava: The vessel was normal in size. The   respirophasic diameter changes were in the normal range (= 50%),   consistent with normal central venous pressure. Impressions: - Normal LV size with mild LV hypertrophy. EF 55-60%. Normal RV   size and systolic function. No significant valvular   abnormalities.  08/24/15: Echocardiogram:  Study Conclusions - Left ventricle: The cavity size was normal. Systolic function was  mildly to moderately reduced. The estimated ejection fraction was  in the range of 40% to 45%. There is severe asymmetric septal  hypetrophy without dynamic obstruction. Findings suggestive of  hypertrophic nonobstructive cardiomyopathy. Diffuse hypokinesis.  Doppler parameters are consistent with abnormal left ventricular  relaxation (grade 1 diastolic dysfunction). Septal thickness, ED  (2D): 17 mm. Posterior wall thickness, ED (2D): 10.5 mm. - Technically difficult study. Impressions: - Technically difficult study, however appears to have asymmetric  septal hypertrophic cardiomyoapathy without obstruction. In  setting of VF arrest, would consider  cardiac MRI to better  evaluate as this could be the potential etiology of  her  arrhythmia.  08/24/15: LHC Conclusion    Widely patent coronary arteries Normal LVEDP       Recent Labs: No results found for requested labs within last 8760 hours.  No results found for requested labs within last 8760 hours.   CrCl cannot be calculated (Patient's most recent lab result is older than the maximum 21 days allowed.).   Wt Readings from Last 3 Encounters:  08/29/20 271 lb 3.2 oz (123 kg)  08/16/20 267 lb (121.1 kg)  02/13/20 236 lb (107 kg)     Other studies reviewed: Additional studies/records reviewed today include: summarized above    ASSESSMENT AND PLAN:  1. VF arrest, QT prolongation felt secondary to psych meds 2. ICD     meds reviewed      QT is OK today     Reminded her to always ask the pharmacist to check prescriptions for QT        3. Bipolar     Continue f/u with psych      Avoid QT prolonging medications and caution with antipsychotics in the future      She is aware  4. HTN 5. hypertensive CM, chronic CHF (disatolic)      Looks OK      OptiVol is way down        6. Paroxysmal AFib     SCAF    CHA2DS2Vasc is 58 (age, female, HTN, diastolic CHF)     she does have prior stroke, though she reports this was in the environment of her cardiac arrest, notes report suspect 2/2 LHC procedure    On Eliquis, appropriately dosed by last labs     0 % burden     Labs today         Disposition:  Remotes as usual and in clinic in 70mo, sooner if needed    Current medicines are reviewed at length with the patient today.  The patient did not have any concerns regarding medicines.  Haywood Lasso, PA-C 09/28/2020 5:34 AM     [address] [place] [address] [phone] (office)  [phone] (fax)

## 2020-09-28 NOTE — Patient Instructions (Signed)
Medication Instructions:    Your physician recommends that you continue on your current medications as directed. Please refer to the Current Medication list given to you today. *If you need a refill on your cardiac medications before your next appointment, please call your pharmacy*   Lab Work:  [place]    If you have labs (blood work) drawn today and your tests are completely normal, you will receive your results only by: Marland Kitchen MyChart Message (if you have MyChart) OR . A paper copy in the mail If you have any lab test that is abnormal or we need to change your treatment, we will call you to review the results.   Testing/Procedures:  NONE ORDERED  TODAY    Follow-Up: At [facility] you and your health needs are our priority.  As part of our continuing mission to provide you with exceptional heart care, we have created designated Provider Care Teams.  These Care Teams include your primary Cardiologist (physician) and Advanced Practice Providers (APPs -  Physician Assistants and Nurse Practitioners) who all work together to provide you with the care you need, when you need it.  We recommend signing up for the patient portal called "MyChart".  Sign up information is provided on this After Visit Summary.  MyChart is used to connect with patients for Virtual Visits (Telemedicine).  Patients are able to view lab/test results, encounter notes, upcoming appointments, etc.  Non-urgent messages can be sent to your provider as well.   To learn more about what you can do with MyChart, go to [email or web address].    Your next appointment:   6 month(s)  The format for your next appointment:   In Person  Provider:   You may see Thompson Grayer, MD or one of the following Advanced Practice Providers on your designated Care Team:    Chanetta Marshall, NP  Tommye Standard, PA-C  Legrand Como "Oda Kilts, [place]    Other Instructions

## 2020-09-29 LAB — CBC
Hematocrit: 39.4 % (ref 34.0–46.6)
Hemoglobin: 12.7 g/dL (ref 11.1–15.9)
MCH: 30.2 pg (ref 26.6–33.0)
MCHC: 32.2 g/dL (ref 31.5–35.7)
MCV: 94 fL (ref 79–97)
Platelets: 495 10*3/uL — ABNORMAL HIGH (ref 150–450)
RBC: 4.21 x10E6/uL (ref 3.77–5.28)
RDW: 13.7 % (ref 11.7–15.4)
WBC: 9.5 10*3/uL (ref 3.4–10.8)

## 2020-09-29 LAB — BASIC METABOLIC PANEL
BUN/Creatinine Ratio: 25 (ref 12–28)
BUN: 28 mg/dL — ABNORMAL HIGH (ref 8–27)
CO2: 25 mmol/L (ref 20–29)
Calcium: 9.9 mg/dL (ref 8.7–10.3)
Chloride: 103 mmol/L (ref 96–106)
Creatinine, Ser: 1.14 mg/dL — ABNORMAL HIGH (ref 0.57–1.00)
Glucose: 85 mg/dL (ref 65–99)
Potassium: 4.6 mmol/L (ref 3.5–5.2)
Sodium: 143 mmol/L (ref 134–144)
eGFR: 53 mL/min/{1.73_m2} — ABNORMAL LOW (ref >59)

## 2020-10-22 ENCOUNTER — Ambulatory Visit (INDEPENDENT_AMBULATORY_CARE_PROVIDER_SITE_OTHER): Payer: Medicare HMO

## 2020-10-22 DIAGNOSIS — I5032 Chronic diastolic (congestive) heart failure: Secondary | ICD-10-CM

## 2020-10-22 DIAGNOSIS — Z95810 Presence of automatic (implantable) cardiac defibrillator: Secondary | ICD-10-CM | POA: Diagnosis not present

## 2020-10-24 NOTE — Progress Notes (Signed)
EPIC Encounter for ICM Monitoring  Patient Name: Marie Ramos is a 66 y.o. female Date: 10/24/2020 Primary Care Physican: Lawerance Cruel, MD Primary Cardiologist: Allred Electrophysiologist: Allred 3/16/2022Weight:240lbs  Spoke with patient and reports feeling well at this time.  Denies fluid symptoms.    Optivol thoracic impedancenormal.  Prescribed:Furosemide 20 mg 1 tablet daily  Labs: 09/28/2020 Creatinine 1.14, BUN 28, Potassium 4.6, Sodium 143 A complete set of results can be found in Results Review.  Recommendations: No changes and encouraged to call if experiencing any fluid symptoms.  Follow-up plan: ICM clinic phone appointment on5/31/2022. 91 day device clinic remote transmission5/06/2021.   EP/Cardiology Office Visits: None at this time.   Copy of ICM check sent to Dr.Allred.   3 month ICM trend: 10/22/2020.    1 Year ICM trend:       [name], RN 10/24/2020 1:22 PM

## 2020-11-13 ENCOUNTER — Other Ambulatory Visit: Payer: Self-pay | Admitting: Physician Assistant

## 2020-11-15 ENCOUNTER — Ambulatory Visit (INDEPENDENT_AMBULATORY_CARE_PROVIDER_SITE_OTHER): Payer: Medicare HMO

## 2020-11-15 DIAGNOSIS — I469 Cardiac arrest, cause unspecified: Secondary | ICD-10-CM | POA: Diagnosis not present

## 2020-11-15 LAB — CUP PACEART REMOTE DEVICE CHECK
Battery Remaining Longevity: 49 mo
Battery Voltage: 2.98 V
Brady Statistic AP VP Percent: 0.02 %
Brady Statistic AP VS Percent: 0.06 %
Brady Statistic AS VP Percent: 0.1 %
Brady Statistic AS VS Percent: 99.82 %
Brady Statistic RA Percent Paced: 0.08 %
Brady Statistic RV Percent Paced: 0.12 %
Date Time Interrogation Session: 20220512001603
HighPow Impedance: 99 Ohm
Implantable Lead Implant Date: 20170227
Implantable Lead Implant Date: 20170227
Implantable Lead Location: 753859
Implantable Lead Location: 753860
Implantable Lead Model: 5076
Implantable Pulse Generator Implant Date: 20170227
Lead Channel Impedance Value: 399 Ohm
Lead Channel Impedance Value: 418 Ohm
Lead Channel Impedance Value: 475 Ohm
Lead Channel Pacing Threshold Amplitude: 0.5 V
Lead Channel Pacing Threshold Amplitude: 0.5 V
Lead Channel Pacing Threshold Pulse Width: 0.4 ms
Lead Channel Pacing Threshold Pulse Width: 0.4 ms
Lead Channel Sensing Intrinsic Amplitude: 25.25 mV
Lead Channel Sensing Intrinsic Amplitude: 25.25 mV
Lead Channel Sensing Intrinsic Amplitude: 3.125 mV
Lead Channel Sensing Intrinsic Amplitude: 3.125 mV
Lead Channel Setting Pacing Amplitude: 2 V
Lead Channel Setting Pacing Amplitude: 2.5 V
Lead Channel Setting Pacing Pulse Width: 0.4 ms
Lead Channel Setting Sensing Sensitivity: 0.3 mV

## 2020-11-21 DIAGNOSIS — H43393 Other vitreous opacities, bilateral: Secondary | ICD-10-CM | POA: Diagnosis not present

## 2020-11-21 DIAGNOSIS — H2513 Age-related nuclear cataract, bilateral: Secondary | ICD-10-CM | POA: Diagnosis not present

## 2020-11-21 DIAGNOSIS — H353132 Nonexudative age-related macular degeneration, bilateral, intermediate dry stage: Secondary | ICD-10-CM | POA: Diagnosis not present

## 2020-11-21 DIAGNOSIS — H02423 Myogenic ptosis of bilateral eyelids: Secondary | ICD-10-CM | POA: Diagnosis not present

## 2020-11-28 DIAGNOSIS — Z0100 Encounter for examination of eyes and vision without abnormal findings: Secondary | ICD-10-CM | POA: Diagnosis not present

## 2020-12-04 ENCOUNTER — Ambulatory Visit (INDEPENDENT_AMBULATORY_CARE_PROVIDER_SITE_OTHER): Payer: Medicare HMO

## 2020-12-04 DIAGNOSIS — Z95810 Presence of automatic (implantable) cardiac defibrillator: Secondary | ICD-10-CM

## 2020-12-04 DIAGNOSIS — I5032 Chronic diastolic (congestive) heart failure: Secondary | ICD-10-CM

## 2020-12-05 ENCOUNTER — Telehealth: Payer: Self-pay

## 2020-12-05 NOTE — Telephone Encounter (Signed)
Remote ICM transmission received.  Attempted call to patient regarding ICM remote transmission and number invalid.

## 2020-12-05 NOTE — Progress Notes (Signed)
EPIC Encounter for ICM Monitoring  Patient Name: Marie Ramos is a 65 y.o. female Date: 12/05/2020 Primary Care Physican: Lawerance Cruel, MD Primary Cardiologist: Allred Electrophysiologist: Allred 3/16/2022Weight:240lbs  Attempted call to patient and unable to reach.   Transmission reviewed.    Optivol thoracic impedancenormal.  Prescribed:Furosemide 20 mg 1 tablet daily  Labs: 09/28/2020 Creatinine 1.14, BUN 28, Potassium 4.6, Sodium 143 A complete set of results can be found in Results Review.  Recommendations: Unable to reach.    Follow-up plan: ICM clinic phone appointment on7/05/2021. 91 day device clinic remote transmission8/05/2021.   EP/Cardiology Office Visits:03/27/2021 with Tommye Standard, PA  Copy of ICM check sent to Dr.Allred.   3 month ICM trend: 12/04/2020.    1 Year ICM trend:       [name], RN 12/05/2020 5:06 PM

## 2020-12-07 ENCOUNTER — Other Ambulatory Visit (HOSPITAL_COMMUNITY): Payer: Self-pay | Admitting: Nurse Practitioner

## 2020-12-07 NOTE — Telephone Encounter (Signed)
73f, 122.6kg, scr 1.14 09/28/20, lovw/ursuy 09/28/20

## 2020-12-07 NOTE — Progress Notes (Signed)
Remote ICD transmission.   

## 2020-12-30 DIAGNOSIS — R062 Wheezing: Secondary | ICD-10-CM | POA: Diagnosis not present

## 2020-12-30 DIAGNOSIS — R059 Cough, unspecified: Secondary | ICD-10-CM | POA: Diagnosis not present

## 2020-12-30 DIAGNOSIS — R0602 Shortness of breath: Secondary | ICD-10-CM | POA: Diagnosis not present

## 2020-12-30 DIAGNOSIS — R0902 Hypoxemia: Secondary | ICD-10-CM | POA: Diagnosis not present

## 2020-12-30 DIAGNOSIS — Z20822 Contact with and (suspected) exposure to covid-19: Secondary | ICD-10-CM | POA: Diagnosis not present

## 2021-01-14 ENCOUNTER — Telehealth: Payer: Self-pay | Admitting: Physician Assistant

## 2021-01-14 ENCOUNTER — Other Ambulatory Visit: Payer: Self-pay

## 2021-01-14 ENCOUNTER — Ambulatory Visit (INDEPENDENT_AMBULATORY_CARE_PROVIDER_SITE_OTHER): Payer: Medicare HMO

## 2021-01-14 DIAGNOSIS — Z95810 Presence of automatic (implantable) cardiac defibrillator: Secondary | ICD-10-CM | POA: Diagnosis not present

## 2021-01-14 DIAGNOSIS — I5032 Chronic diastolic (congestive) heart failure: Secondary | ICD-10-CM

## 2021-01-14 MED ORDER — ALPRAZOLAM 0.25 MG PO TABS: ORAL_TABLET | ORAL | 0 refills | Status: DC

## 2021-01-14 NOTE — Telephone Encounter (Signed)
Pt left a message that she needs a refill on her xanax to be sent to the [facility] on [address]. She has an appt in august

## 2021-01-14 NOTE — Telephone Encounter (Signed)
Pended.

## 2021-01-15 ENCOUNTER — Telehealth: Payer: Self-pay | Admitting: Physician Assistant

## 2021-01-15 NOTE — Telephone Encounter (Signed)
Pt called to move apt up to asap. Not feeling well. Depression worsened. Also talked with husband [name]. He stated she was sick with bad cough for 3-4 weeks took Robitussin, antibiotics and albuterol inhaler. Did not have covid. Now depression worse.  May need med adjustment or cocktail.  On canc list. Contact # pt [phone] or [name]  [phone]

## 2021-01-16 ENCOUNTER — Other Ambulatory Visit: Payer: Self-pay

## 2021-01-16 MED ORDER — VENLAFAXINE HCL ER 75 MG PO CP24: 225.0000 mg | ORAL_CAPSULE | Freq: Every day | ORAL | 2 refills | Status: DC

## 2021-01-16 NOTE — Telephone Encounter (Signed)
Pt notified of increase to 225 mg daily. Updated Rx sent.

## 2021-01-16 NOTE — Progress Notes (Signed)
EPIC Encounter for ICM Monitoring  Patient Name: Marie Ramos is a 65 y.o. female Date: 01/16/2021 Primary Care Physican: Lawerance Cruel, MD Primary Cardiologist: Allred Electrophysiologist: Allred 01/16/2021 Weight: 250 lbs  AT/AF 1 Time in AT/AF <0.1 hr/day (<0.1%) (taking Eliquis) Longest AT/AF 55 seconds         Spoke with patient and heart failure questions reviewed.  Pt asymptomatic for fluid accumulation and feeing well.         Optivol thoracic impedance normal.   Prescribed: Furosemide 20 mg 1 tablet daily   Labs: 09/28/2020 Creatinine 1.14, BUN 28, Potassium 4.6, Sodium 143 A complete set of results can be found in Results Review.   Recommendations:  No changes and encouraged to call if experiencing any fluid symptoms.   Follow-up plan: ICM clinic phone appointment on 02/18/2021.  91 day device clinic remote transmission 02/14/2021.      EP/Cardiology Office Visits: 03/27/2021 with Oda Kilts, PA.    Copy of ICM check sent to Dr. Rayann Heman.      3 month ICM trend: 01/14/2021.    1 Year ICM trend:       [name], RN 01/16/2021 11:18 AM

## 2021-01-16 NOTE — Telephone Encounter (Signed)
Have her increase Effexor XR from 150 mg to total of 225 mg. Let me know if I need to send in new Rx.

## 2021-01-25 ENCOUNTER — Encounter: Payer: Self-pay | Admitting: Physician Assistant

## 2021-01-25 ENCOUNTER — Other Ambulatory Visit: Payer: Self-pay

## 2021-01-25 ENCOUNTER — Ambulatory Visit: Payer: Medicare HMO | Admitting: Physician Assistant

## 2021-01-25 DIAGNOSIS — F411 Generalized anxiety disorder: Secondary | ICD-10-CM

## 2021-01-25 DIAGNOSIS — F99 Mental disorder, not otherwise specified: Secondary | ICD-10-CM | POA: Diagnosis not present

## 2021-01-25 DIAGNOSIS — F331 Major depressive disorder, recurrent, moderate: Secondary | ICD-10-CM | POA: Diagnosis not present

## 2021-01-25 DIAGNOSIS — F5105 Insomnia due to other mental disorder: Secondary | ICD-10-CM | POA: Diagnosis not present

## 2021-01-25 DIAGNOSIS — F319 Bipolar disorder, unspecified: Secondary | ICD-10-CM | POA: Diagnosis not present

## 2021-01-25 MED ORDER — ALPRAZOLAM 0.25 MG PO TABS: 0.1250 mg | ORAL_TABLET | Freq: Three times a day (TID) | ORAL | 1 refills | Status: DC | PRN

## 2021-01-25 NOTE — Progress Notes (Signed)
[facility]  Patient ID: Marie Ramos,  MRN: [id number]  PCP: Lawerance Cruel, MD  Date of Evaluation: 01/25/2021 Time spent:40 minutes  Chief Complaint:  Chief Complaint   Follow-up; Bipolar I disorder ([place])     HISTORY/CURRENT STATUS: Not doing well.  For about a month, has not had any motivation at all, no energy, doesn't cry easily, doesn't want to do anything including being with her granddaughter which she loves to do normally. She doesn't make plans to do anything when she would sometimes. Her house isn't clean b/c she'll just sit and not do much of anything. Does Word Search puzzles. Does like to read but hasn't been doing much of that lately. Watches tv a lot. Personal hygiene is normal. Appetite is up. Effexor was increased on 01/15/2021. Denies SI/HI.  She answers "sometimes" to many of the questions I asked.  States she has a hard time knowing how to answer questions.  Reports anxiety, mostly generalized sense of unease or feelings of guilt that she does not want to do more things so that makes her anxious.  She does continue to use the Xanax which is helpful.  Does have to take it every night to help him relax to go to sleep.  Patient denies increased energy with decreased need for sleep, no increased talkativeness, no racing thoughts, no impulsivity or risky behaviors, no increased spending, no increased libido, no grandiosity, no increased irritability or anger, and no hallucinations.  Denies dizziness, syncope, seizures, numbness, tingling, tremor, tics, unsteady gait, slurred speech, confusion. Denies muscle or joint pain, stiffness, or dystonia.  Individual Medical History/ Review of Systems: Changes? :No     Past medications for mental health diagnoses include: Lithium, Trileptal, Effexor XR,  Allergies: Sulfa antibiotics, Other, Sulfamethoxazole-trimethoprim, Vicodin hp [hydrocodone-acetaminophen], Naproxen, and Vicodin  [hydrocodone-acetaminophen]  Current Medications:  Current Outpatient Medications:    amLODipine (NORVASC) 5 MG tablet, Take 1 tablet (5 mg total) by mouth daily with breakfast., Disp: 30 tablet, Rfl: 0   Ascorbic Acid (VITAMIN C PO), Take 1 capsule by mouth daily., Disp: , Rfl:    atorvastatin (LIPITOR) 20 MG tablet, Take 1 tablet (20 mg total) by mouth daily at 6 PM., Disp: 30 tablet, Rfl: 0   carvedilol (COREG) 12.5 MG tablet, TAKE ONE TABLET BY MOUTH TWICE DAILY WITH BREAKFAST AND DINNER, Disp: 180 tablet, Rfl: 2   DENTAGEL 1.1 % GEL dental gel, Place 1-2 application onto teeth 3 times/day as needed-between meals & bedtime., Disp: , Rfl:    ELIQUIS 5 MG TABS tablet, TAKE ONE TABLET BY MOUTH TWICE DAILY, Disp: 180 tablet, Rfl: 1   famotidine (PEPCID) 20 MG tablet, Take 20 mg by mouth at bedtime., Disp: , Rfl:    fexofenadine (ALLEGRA) 180 MG tablet, Take 180 mg by mouth daily., Disp: , Rfl:    fluticasone (FLONASE) 50 MCG/ACT nasal spray, Place 1 spray into both nostrils daily. Use as directed, Disp: , Rfl:    furosemide (LASIX) 20 MG tablet, TAKE ONE TABLET BY MOUTH ONE TIME DAILY , Disp: 90 tablet, Rfl: 2   losartan (COZAAR) 100 MG tablet, Take 1 tablet (100 mg total) by mouth daily., Disp: 90 tablet, Rfl: 3   Multiple Vitamin (MULTIVITAMIN WITH MINERALS) TABS tablet, Take 1 tablet by mouth daily., Disp: , Rfl:    Multiple Vitamins-Minerals (OCUVITE PO), Take 1 capsule by mouth daily., Disp: , Rfl:    Oxcarbazepine (TRILEPTAL) 300 MG tablet, Take 1.5 tablets (450 mg total) by mouth  2 (two) times daily., Disp: 270 tablet, Rfl: 1   Risankizumab-rzaa (SKYRIZI, 150 MG DOSE, [place]), Inject into the skin every 3 (three) months., Disp: , Rfl:    triamcinolone ointment (KENALOG) 0.5 %, Apply 1 application topically 2 (two) times daily. , Disp: , Rfl: 0   venlafaxine XR (EFFEXOR-XR) 75 MG 24 hr capsule, Take 3 capsules (225 mg total) by mouth at bedtime., Disp: 90 capsule, Rfl: 2   ALPRAZolam (XANAX)  0.25 MG tablet, Take 0.5-1 tablets (0.125-0.25 mg total) by mouth 3 (three) times daily as needed for anxiety., Disp: 90 tablet, Rfl: 1   traMADol (ULTRAM) 50 MG tablet, See admin instructions. (Patient not taking: Reported on 01/25/2021), Disp: , Rfl:  Medication Side Effects: none  Family Medical/ Social History: Changes? No  MENTAL HEALTH EXAM:  There were no vitals taken for this visit.There is no height or weight on file to calculate BMI.  General Appearance: Casual, Neat, Well Groomed and Obese  Eye Contact:  Good  Speech:  Clear and Coherent and Normal Rate  Volume:  Normal  Mood:  Depressed and Hopeless  Affect:  Depressed and Flat  Thought Process:  Goal Directed and Descriptions of Associations: Intact  Orientation:  Full (Time, Place, and Person)  Thought Content: Logical   Suicidal Thoughts:  No  Homicidal Thoughts:  No  Memory:  WNL  Judgement:  Good  Insight:  Good  Psychomotor Activity:  Normal  Concentration:  Concentration: Fair and Attention Span: Fair  Recall:  Good  Fund of Knowledge: Good  Language: Good  Assets:  Desire for Improvement  ADL's:  Intact  Cognition: WNL  Prognosis:  Good   09/28/2020 CBC, BMP reviewed   DIAGNOSES:    ICD-10-CM   1. Major depressive disorder, recurrent episode, moderate (HCC)  F33.1     2. Generalized anxiety disorder  F41.1     3. Insomnia due to other mental disorder  F51.05    F99     4. Bipolar I disorder ([place])  F31.9        Receiving Psychotherapy: No    RECOMMENDATIONS:  PDMP reviewed. Last Xanax was 01/14/2021.  I provided 40 minutes of face to face time during this encounter, including time spent before and after the visit in records review, medical decision making, and charting.  We discussed her symptoms and the different options for treatment.  The Effexor was increased by phone approximately 10 days ago so it is too soon to tell the efficacy yet.  I recommend that we stay on the current treatment for  another month or so before considering any other treatment.  She understands and agrees. Continue Effexor XR 75 mg, 3 p.o. daily. Continue Trileptal 300 mg, 1.5 pills p.o. twice daily. Continue Xanax 0.25 mg, 1 p.o. nightly as needed sleep or anxiety. Return in 1 month.  Avoid drugs that are known to cause prolonged QT interval  Donnal Moat, PA-C

## 2021-02-14 ENCOUNTER — Ambulatory Visit (INDEPENDENT_AMBULATORY_CARE_PROVIDER_SITE_OTHER): Payer: Medicare HMO

## 2021-02-14 DIAGNOSIS — I5032 Chronic diastolic (congestive) heart failure: Secondary | ICD-10-CM

## 2021-02-17 LAB — CUP PACEART REMOTE DEVICE CHECK
Battery Remaining Longevity: 45 mo
Battery Voltage: 2.97 V
Brady Statistic AP VP Percent: 0 %
Brady Statistic AP VS Percent: 0.27 %
Brady Statistic AS VP Percent: 0.07 %
Brady Statistic AS VS Percent: 99.65 %
Brady Statistic RA Percent Paced: 0.28 %
Brady Statistic RV Percent Paced: 0.08 %
Date Time Interrogation Session: 20220811192505
HighPow Impedance: 90 Ohm
Implantable Lead Implant Date: 20170227
Implantable Lead Implant Date: 20170227
Implantable Lead Location: 753859
Implantable Lead Location: 753860
Implantable Lead Model: 5076
Implantable Pulse Generator Implant Date: 20170227
Lead Channel Impedance Value: 399 Ohm
Lead Channel Impedance Value: 399 Ohm
Lead Channel Impedance Value: 513 Ohm
Lead Channel Pacing Threshold Amplitude: 0.5 V
Lead Channel Pacing Threshold Amplitude: 0.5 V
Lead Channel Pacing Threshold Pulse Width: 0.4 ms
Lead Channel Pacing Threshold Pulse Width: 0.4 ms
Lead Channel Sensing Intrinsic Amplitude: 25 mV
Lead Channel Sensing Intrinsic Amplitude: 25 mV
Lead Channel Sensing Intrinsic Amplitude: 3.625 mV
Lead Channel Sensing Intrinsic Amplitude: 3.625 mV
Lead Channel Setting Pacing Amplitude: 2 V
Lead Channel Setting Pacing Amplitude: 2.5 V
Lead Channel Setting Pacing Pulse Width: 0.4 ms
Lead Channel Setting Sensing Sensitivity: 0.3 mV

## 2021-02-18 ENCOUNTER — Ambulatory Visit (INDEPENDENT_AMBULATORY_CARE_PROVIDER_SITE_OTHER): Payer: Medicare HMO

## 2021-02-18 DIAGNOSIS — I5032 Chronic diastolic (congestive) heart failure: Secondary | ICD-10-CM

## 2021-02-18 DIAGNOSIS — Z95810 Presence of automatic (implantable) cardiac defibrillator: Secondary | ICD-10-CM

## 2021-02-19 NOTE — Progress Notes (Signed)
EPIC Encounter for ICM Monitoring  Patient Name: Marie Ramos is a 65 y.o. female Date: 02/19/2021 Primary Care Physican: Lawerance Cruel, MD Primary Cardiologist: Allred Electrophysiologist: Allred 01/16/2021 Weight: 250 lbs   Time in AT/AF  0.0 hr/day (0.0%) (taking Eliquis)         Spoke with patient and heart failure questions reviewed.  Pt asymptomatic for fluid accumulation and feeing well.         Optivol thoracic impedance normal.   Prescribed: Furosemide 20 mg 1 tablet daily   Labs: 09/28/2020 Creatinine 1.14, BUN 28, Potassium 4.6, Sodium 143 A complete set of results can be found in Results Review.   Recommendations:  No changes and encouraged to call if experiencing any fluid symptoms.   Follow-up plan: ICM clinic phone appointment on 03/25/2021.  91 day device clinic remote transmission 05/16/2021.      EP/Cardiology Office Visits: 03/27/2021 with Oda Kilts, PA.    Copy of ICM check sent to Dr. Rayann Heman.       3 month ICM trend: 02/18/2021.    1 Year ICM trend:      [name], RN 02/19/2021 3:29 PM

## 2021-02-21 ENCOUNTER — Other Ambulatory Visit: Payer: Self-pay

## 2021-02-21 ENCOUNTER — Ambulatory Visit: Payer: Medicare HMO | Admitting: Physician Assistant

## 2021-02-21 DIAGNOSIS — F411 Generalized anxiety disorder: Secondary | ICD-10-CM

## 2021-02-21 DIAGNOSIS — F5105 Insomnia due to other mental disorder: Secondary | ICD-10-CM | POA: Diagnosis not present

## 2021-02-21 DIAGNOSIS — D7589 Other specified diseases of blood and blood-forming organs: Secondary | ICD-10-CM | POA: Diagnosis not present

## 2021-02-21 DIAGNOSIS — F331 Major depressive disorder, recurrent, moderate: Secondary | ICD-10-CM | POA: Diagnosis not present

## 2021-02-21 DIAGNOSIS — F319 Bipolar disorder, unspecified: Secondary | ICD-10-CM | POA: Diagnosis not present

## 2021-02-21 DIAGNOSIS — E7800 Pure hypercholesterolemia, unspecified: Secondary | ICD-10-CM | POA: Diagnosis not present

## 2021-02-21 DIAGNOSIS — I10 Essential (primary) hypertension: Secondary | ICD-10-CM | POA: Diagnosis not present

## 2021-02-21 DIAGNOSIS — F99 Mental disorder, not otherwise specified: Secondary | ICD-10-CM

## 2021-02-21 NOTE — Progress Notes (Signed)
[facility]  Patient ID: Marie Ramos,  MRN: [id number]  PCP: Lawerance Cruel, MD  Date of Evaluation: 02/21/2021 Time spent:30 minutes  Chief Complaint:  Chief Complaint   Depression      HISTORY/CURRENT STATUS:  Last seen a month ago, Effexor dose had just been increased so no changes were made. She still shrugs her shoulders and answers 'sometimes' to many of my questions. Does say she's spending time with her granddaughter and is enjoying it more, doesn't dread things as much or feel as drained. Not isolating as much. ADLs nl. Not crying easily. No SI/HI.  Takes Xanax every night to help her relax to go to sleep. Anxiety isn't as bad as it was at the last visit. No PA. Has racing thoughts often, especially at night, Xanax helps.   Patient denies increased energy with decreased need for sleep, no increased talkativeness, no racing thoughts, no impulsivity or risky behaviors, no increased spending, no increased libido, no grandiosity, no increased irritability or anger, and no hallucinations.  Review of Systems  Constitutional:  Positive for malaise/fatigue.  HENT: Negative.    Eyes: Negative.   Respiratory: Negative.    Cardiovascular: Negative.   Gastrointestinal: Negative.   Genitourinary: Negative.   Musculoskeletal:  Positive for back pain.  Skin: Negative.   Neurological: Negative.   Endo/Heme/Allergies: Negative.   Psychiatric/Behavioral:         See HPI    Individual Medical History/ Review of Systems: Changes? :No     Past medications for mental health diagnoses include: Lithium, Trileptal, Effexor XR,  Allergies: Sulfa antibiotics, Other, Sulfamethoxazole-trimethoprim, Vicodin hp [hydrocodone-acetaminophen], Naproxen, and Vicodin [hydrocodone-acetaminophen]  Current Medications:  Current Outpatient Medications:    ALPRAZolam (XANAX) 0.25 MG tablet, Take 0.5-1 tablets (0.125-0.25 mg total) by mouth 3 (three) times daily as needed for  anxiety., Disp: 90 tablet, Rfl: 1   amLODipine (NORVASC) 5 MG tablet, Take 1 tablet (5 mg total) by mouth daily with breakfast., Disp: 30 tablet, Rfl: 0   Ascorbic Acid (VITAMIN C PO), Take 1 capsule by mouth daily., Disp: , Rfl:    atorvastatin (LIPITOR) 20 MG tablet, Take 1 tablet (20 mg total) by mouth daily at 6 PM., Disp: 30 tablet, Rfl: 0   ELIQUIS 5 MG TABS tablet, TAKE ONE TABLET BY MOUTH TWICE DAILY, Disp: 180 tablet, Rfl: 1   famotidine (PEPCID) 20 MG tablet, Take 20 mg by mouth at bedtime., Disp: , Rfl:    fexofenadine (ALLEGRA) 180 MG tablet, Take 180 mg by mouth daily., Disp: , Rfl:    fluticasone (FLONASE) 50 MCG/ACT nasal spray, Place 1 spray into both nostrils daily. Use as directed, Disp: , Rfl:    furosemide (LASIX) 20 MG tablet, TAKE ONE TABLET BY MOUTH ONE TIME DAILY , Disp: 90 tablet, Rfl: 2   losartan (COZAAR) 100 MG tablet, Take 1 tablet (100 mg total) by mouth daily., Disp: 90 tablet, Rfl: 3   Multiple Vitamin (MULTIVITAMIN WITH MINERALS) TABS tablet, Take 1 tablet by mouth daily., Disp: , Rfl:    Multiple Vitamins-Minerals (OCUVITE PO), Take 1 capsule by mouth daily., Disp: , Rfl:    Oxcarbazepine (TRILEPTAL) 300 MG tablet, Take 1.5 tablets (450 mg total) by mouth 2 (two) times daily., Disp: 270 tablet, Rfl: 1   Risankizumab-rzaa (SKYRIZI, 150 MG DOSE, [place]), Inject into the skin every 3 (three) months., Disp: , Rfl:    triamcinolone ointment (KENALOG) 0.5 %, Apply 1 application topically 2 (two) times daily. , Disp: ,  Rfl: 0   venlafaxine XR (EFFEXOR-XR) 75 MG 24 hr capsule, Take 3 capsules (225 mg total) by mouth at bedtime., Disp: 90 capsule, Rfl: 2   carvedilol (COREG) 12.5 MG tablet, TAKE ONE TABLET BY MOUTH TWICE DAILY WITH BREAKFAST AND DINNER (Patient not taking: Reported on 02/21/2021), Disp: 180 tablet, Rfl: 2   DENTAGEL 1.1 % GEL dental gel, Place 1-2 application onto teeth 3 times/day as needed-between meals & bedtime., Disp: , Rfl:    traMADol (ULTRAM) 50 MG  tablet, See admin instructions. (Patient not taking: No sig reported), Disp: , Rfl:  Medication Side Effects: none  Family Medical/ Social History: Changes? No  MENTAL HEALTH EXAM:  There were no vitals taken for this visit.There is no height or weight on file to calculate BMI.  General Appearance: Casual, Neat, Well Groomed and Obese  Eye Contact:  Good  Speech:  Clear and Coherent and Normal Rate  Volume:  Normal  Mood:  Depressed and seems more upbeat than last month.  Affect:  Appropriate  Thought Process:  Goal Directed and Descriptions of Associations: Intact  Orientation:  Full (Time, Place, and Person)  Thought Content: Logical   Suicidal Thoughts:  No  Homicidal Thoughts:  No  Memory:  WNL  Judgement:  Good  Insight:  Good  Psychomotor Activity:  Normal  Concentration:  Concentration: Fair and Attention Span: Fair  Recall:  Good  Fund of Knowledge: Good  Language: Good  Assets:  Desire for Improvement  ADL's:  Intact  Cognition: WNL  Prognosis:  Good    DIAGNOSES:    ICD-10-CM   1. Major depressive disorder, recurrent episode, moderate (HCC)  F33.1     2. Generalized anxiety disorder  F41.1     3. Insomnia due to other mental disorder  F51.05    F99     4. Bipolar I disorder ([place])  F31.9        Receiving Psychotherapy: No    RECOMMENDATIONS:  PDMP reviewed. Xanax 02/14/2021 I provided 30 minutes of face to face time during this encounter, including time spent before and after the visit in records review, medical decision making, and charting.  She seems to be responding to current tx so no changes will be made.  Continue Effexor XR 75 mg, 3 p.o. daily. Continue Trileptal 300 mg, 1.5 pills p.o. twice daily. Continue Xanax 0.25 mg, 1 p.o. nightly as needed sleep or anxiety. Strongly recommend therapy. Return in 6-8 weeks.  Avoid drugs that are known to cause prolonged QT interval  Donnal Moat, PA-C

## 2021-02-25 ENCOUNTER — Ambulatory Visit: Payer: Medicare HMO | Admitting: Physician Assistant

## 2021-02-27 DIAGNOSIS — I10 Essential (primary) hypertension: Secondary | ICD-10-CM | POA: Diagnosis not present

## 2021-02-27 DIAGNOSIS — E7800 Pure hypercholesterolemia, unspecified: Secondary | ICD-10-CM | POA: Diagnosis not present

## 2021-02-27 DIAGNOSIS — R7989 Other specified abnormal findings of blood chemistry: Secondary | ICD-10-CM | POA: Diagnosis not present

## 2021-02-27 DIAGNOSIS — K219 Gastro-esophageal reflux disease without esophagitis: Secondary | ICD-10-CM | POA: Diagnosis not present

## 2021-02-27 DIAGNOSIS — Z0000 Encounter for general adult medical examination without abnormal findings: Secondary | ICD-10-CM | POA: Diagnosis not present

## 2021-02-27 DIAGNOSIS — Z1211 Encounter for screening for malignant neoplasm of colon: Secondary | ICD-10-CM | POA: Diagnosis not present

## 2021-03-01 ENCOUNTER — Other Ambulatory Visit: Payer: Self-pay | Admitting: Family Medicine

## 2021-03-01 DIAGNOSIS — Z1231 Encounter for screening mammogram for malignant neoplasm of breast: Secondary | ICD-10-CM

## 2021-03-02 ENCOUNTER — Encounter: Payer: Self-pay | Admitting: Physician Assistant

## 2021-03-07 ENCOUNTER — Telehealth: Payer: Self-pay | Admitting: Physician Assistant

## 2021-03-07 ENCOUNTER — Other Ambulatory Visit: Payer: Self-pay

## 2021-03-07 MED ORDER — OXCARBAZEPINE 300 MG PO TABS: 450.0000 mg | ORAL_TABLET | Freq: Two times a day (BID) | ORAL | 0 refills | Status: DC

## 2021-03-07 NOTE — Telephone Encounter (Signed)
Rx sent 

## 2021-03-07 NOTE — Progress Notes (Signed)
Remote ICD transmission.   

## 2021-03-07 NOTE — Telephone Encounter (Signed)
Pt called and would like refill of Oxcarbonzine '300mg'$  to [facility].  She has 1 pill left.

## 2021-03-13 DIAGNOSIS — Z1211 Encounter for screening for malignant neoplasm of colon: Secondary | ICD-10-CM | POA: Diagnosis not present

## 2021-03-13 DIAGNOSIS — I10 Essential (primary) hypertension: Secondary | ICD-10-CM | POA: Diagnosis not present

## 2021-03-25 ENCOUNTER — Ambulatory Visit (INDEPENDENT_AMBULATORY_CARE_PROVIDER_SITE_OTHER): Payer: Medicare HMO

## 2021-03-25 DIAGNOSIS — Z95810 Presence of automatic (implantable) cardiac defibrillator: Secondary | ICD-10-CM

## 2021-03-25 DIAGNOSIS — I5032 Chronic diastolic (congestive) heart failure: Secondary | ICD-10-CM | POA: Diagnosis not present

## 2021-03-26 NOTE — Progress Notes (Signed)
Electrophysiology Office Note Date: 03/26/2021  ID:  Marie Ramos, Marie Ramos 1956/05/19, MRN [id number]  PCP: Lawerance Cruel, MD Primary Cardiologist: None Electrophysiologist: Thompson Grayer, MD   CC: Routine ICD follow-up  Marie Ramos is a 65 y.o. female seen today for Thompson Grayer, MD for routine electrophysiology followup.  Since last being seen in our clinic the patient reports doing overall well. She is SOB with more than mild exertion, chronically. Trying to lose weight with gradual exercise and "watching what she eats".  she denies chest pain, palpitations, PND, orthopnea, nausea, vomiting, dizziness, syncope, edema, weight gain, or early satiety. She has not had ICD shocks.   Device History: MDT dual chamber ICD implanted 09/03/15, Dr. Rayann Heman, secondary prevention/VF arrest  Past Medical History:  Diagnosis Date   Abnormality of gait    Acute bilat watershed infarction [facility])    Acute lower UTI    Acute metabolic encephalopathy 0/0/1749   Acute respiratory failure (HCC)    Acute systolic congestive heart failure ([place])    AKI (acute kidney injury) ([place])    Anoxic brain damage (HCC)    Bipolar 1 disorder (HCC)    Bipolar affective disorder in remission ([place])    Cardiac arrest ([place]) 08/24/2015   Cerebral infarction due to embolism of cerebral artery ([place])    Cerebrovascular accident (CVA) due to embolism of cerebral artery ([place]) 12/05/2015   Chronically dry eyes    Closed fracture of left distal radius 12/01/2018   DVT (deep venous thrombosis) ([place]) 12/05/2015   Dyspnea    r/t seasonal allergies   Hypertrophic obstructive cardiomyopathy ([place]) 08/29/2015   Leukocytosis    Macular degeneration    Pneumonia due to COVID-19 virus 08/16/2019   Pulmonary vascular congestion 08/16/2019   QT prolongation 08/29/2015   Stroke ([place])    Systolic dysfunction with acute on chronic heart failure ([place])    TBI (traumatic brain injury) ([place]) 2011   Inpatient rehab [facility]    Thrombocytosis    Ventricular fibrillation ([place]) 09/03/15   MDT ICD Dr. Rayann Heman   Past Surgical History:  Procedure Laterality Date   ABDOMINAL HYSTERECTOMY     CARDIAC CATHETERIZATION N/A 08/24/2015   Procedure: Left Heart Cath and Coronary Angiography;  Surgeon: Sherren Mocha, MD;  Location: [facility];  Service: Cardiovascular;  Laterality: N/A;   EP IMPLANTABLE DEVICE N/A 09/03/2015   MDT dual chamber ICD   OPEN REDUCTION INTERNAL FIXATION (ORIF) DISTAL RADIAL FRACTURE Left 12/01/2018   Procedure: OPEN REDUCTION INTERNAL FIXATION (ORIF) LEFT DISTAL RADIAL FRACTURE;  Surgeon: Hiram Gash, MD;  Location: [facility];  Service: Orthopedics;  Laterality: Left;   SPLENECTOMY, TOTAL  2011   TONSILLECTOMY      Current Outpatient Medications  Medication Sig Dispense Refill   ALPRAZolam (XANAX) 0.25 MG tablet Take 0.5-1 tablets (0.125-0.25 mg total) by mouth 3 (three) times daily as needed for anxiety. 90 tablet 1   amLODipine (NORVASC) 5 MG tablet Take 1 tablet (5 mg total) by mouth daily with breakfast. 30 tablet 0   Ascorbic Acid (VITAMIN C PO) Take 1 capsule by mouth daily.     atorvastatin (LIPITOR) 20 MG tablet Take 1 tablet (20 mg total) by mouth daily at 6 PM. 30 tablet 0   carvedilol (COREG) 12.5 MG tablet TAKE ONE TABLET BY MOUTH TWICE DAILY WITH BREAKFAST AND DINNER (Patient not taking: Reported on 02/21/2021) 180 tablet 2   DENTAGEL 1.1 % GEL dental gel Place 1-2 application onto teeth  3 times/day as needed-between meals & bedtime.     ELIQUIS 5 MG TABS tablet TAKE ONE TABLET BY MOUTH TWICE DAILY 180 tablet 1   famotidine (PEPCID) 20 MG tablet Take 20 mg by mouth at bedtime.     fexofenadine (ALLEGRA) 180 MG tablet Take 180 mg by mouth daily.     fluticasone (FLONASE) 50 MCG/ACT nasal spray Place 1 spray into both nostrils daily. Use as directed     furosemide (LASIX) 20 MG tablet TAKE ONE TABLET BY MOUTH ONE TIME DAILY  90 tablet 2   losartan (COZAAR) 100 MG tablet Take 1 tablet  (100 mg total) by mouth daily. 90 tablet 3   Multiple Vitamin (MULTIVITAMIN WITH MINERALS) TABS tablet Take 1 tablet by mouth daily.     Multiple Vitamins-Minerals (OCUVITE PO) Take 1 capsule by mouth daily.     Oxcarbazepine (TRILEPTAL) 300 MG tablet Take 1.5 tablets (450 mg total) by mouth 2 (two) times daily. 270 tablet 0   Risankizumab-rzaa (SKYRIZI, 150 MG DOSE, [place]) Inject into the skin every 3 (three) months.     traMADol (ULTRAM) 50 MG tablet See admin instructions. (Patient not taking: No sig reported)     triamcinolone ointment (KENALOG) 0.5 % Apply 1 application topically 2 (two) times daily.   0   venlafaxine XR (EFFEXOR-XR) 75 MG 24 hr capsule Take 3 capsules (225 mg total) by mouth at bedtime. 90 capsule 2   No current facility-administered medications for this visit.    Allergies:   Sulfa antibiotics, Other, Sulfamethoxazole-trimethoprim, Vicodin hp [hydrocodone-acetaminophen], Naproxen, and Vicodin [hydrocodone-acetaminophen]   Social History: Social History   Socioeconomic History   Marital status: Married    Spouse name: Not on file   Number of children: 2   Years of education: Not on file   Highest education level: Master's degree (e.g., MA, MS, MEng, MEd, MSW, MBA)  Occupational History   Occupation: substitute Product manager: [employer]  Tobacco Use   Smoking status: Never   Smokeless tobacco: Never  Vaping Use   Vaping Use: Never used  Substance and Sexual Activity   Alcohol use: No   Drug use: No   Sexual activity: Yes    Birth control/protection: Surgical  Other Topics Concern   Not on file  Social History Narrative   Married to [place] for 55 years 19st and only marriage.   2 kids, [name] is 85, and [name] is 6.      Has MBA in human resources but always wanted to teach.  Taught some business courses in past, but likes sub teaching.  High school or either pre-K.        From [place]. Moved here in 1997 b/c of husband's job at  [employer].  Pt's son lives in [place]. Dtr lives in [place].  Has 1 granddaughter, 23 yo.      Grew up in home w/ both parents and sister.  Never abused.       Never legal issues except declared bankruptcy.      Caffeine 1 cup coffee. Or 1 coke a day.      Is Jewish.      Is on Disability for Mental health and heart issues, for 2 years.          Social Determinants of Health   Financial Resource Strain: Not on file  Food Insecurity: Not on file  Transportation Needs: Not on file  Physical Activity: Not on file  Stress:  Not on file  Social Connections: Not on file  Intimate Partner Violence: Not on file    Family History: Family History  Problem Relation Age of Onset   Cancer Mother    Depression Mother    CAD Mother    COPD Father    Depression Father    Diabetes Maternal Grandfather    Depression Maternal Grandfather    Arthritis/Rheumatoid Paternal Grandmother     Review of Systems: All other systems reviewed and are otherwise negative except as noted above.   Physical Exam: There were no vitals filed for this visit.   GEN- The patient is well appearing, alert and oriented x 3 today.   HEENT: normocephalic, atraumatic; sclera clear, conjunctiva pink; hearing intact; oropharynx clear; neck supple, no JVP Lymph- no cervical lymphadenopathy Lungs- Clear to ausculation bilaterally, normal work of breathing.  No wheezes, rales, rhonchi Heart- Regular rate and rhythm, no murmurs, rubs or gallops, PMI not laterally displaced GI- soft, non-tender, non-distended, bowel sounds present, no hepatosplenomegaly Extremities- no clubbing or cyanosis. No edema; DP/PT/radial pulses 2+ bilaterally MS- no significant deformity or atrophy Skin- warm and dry, no rash or lesion; ICD pocket well healed Psych- euthymic mood, full affect Neuro- strength and sensation are intact  ICD interrogation- reviewed in detail today,  See PACEART report  EKG:  EKG is ordered today. Personal  review of EKG ordered today shows NSR at 80 bpm with stable QTc ~ 450 ms  Recent Labs: 09/28/2020: BUN 28; Creatinine, Ser 1.14; Hemoglobin 12.7; Platelets 495; Potassium 4.6; Sodium 143   Wt Readings from Last 3 Encounters:  09/28/20 270 lb 3.2 oz (122.6 kg)  08/29/20 271 lb 3.2 oz (123 kg)  08/16/20 267 lb (121.1 kg)     Other studies Reviewed: Additional studies/ records that were reviewed today include: Previous EP office notes.   Assessment and Plan:  1.  VF arrest, QT prolongation felt secondary to psych meds s/p Medtronic dual chamber ICD  euvolemic today Stable on an appropriate medical regimen Normal ICD function See Pace Art report No changes today  2. Bipolar Continue f/u with psych Avoid QT prolonging medications and caution with antipsychotics in the future  3. HTN Stable on current regimen   4. Chronic diastolic CHF Volume status OK  5. PAF On eliquis for CHA2DS2-VASc of at least 4. She does have prior stroke, though she reports this was in the environment of her cardiac arrest, notes report suspect 2/2 LHC procedure  Current medicines are reviewed at length with the patient today.    Disposition:   Follow up with EP APP in 6 months   Signed, Shirley Friar, PA-C  03/26/2021 2:44 PM  [address] [place] [address] [phone] (office) [phone] (fax)

## 2021-03-27 ENCOUNTER — Other Ambulatory Visit: Payer: Self-pay

## 2021-03-27 ENCOUNTER — Ambulatory Visit: Payer: Medicare HMO | Admitting: Student

## 2021-03-27 ENCOUNTER — Encounter: Payer: Self-pay | Admitting: Student

## 2021-03-27 VITALS — BP 140/82 | HR 80 | Ht 62.0 in | Wt 262.4 lb

## 2021-03-27 DIAGNOSIS — I480 Paroxysmal atrial fibrillation: Secondary | ICD-10-CM | POA: Diagnosis not present

## 2021-03-27 DIAGNOSIS — R9431 Abnormal electrocardiogram [ECG] [EKG]: Secondary | ICD-10-CM

## 2021-03-27 DIAGNOSIS — Z95810 Presence of automatic (implantable) cardiac defibrillator: Secondary | ICD-10-CM

## 2021-03-27 DIAGNOSIS — I5032 Chronic diastolic (congestive) heart failure: Secondary | ICD-10-CM

## 2021-03-27 LAB — CUP PACEART INCLINIC DEVICE CHECK
Battery Remaining Longevity: 40 mo
Battery Voltage: 2.95 V
Brady Statistic AP VP Percent: 0 %
Brady Statistic AP VS Percent: 0.14 %
Brady Statistic AS VP Percent: 0.06 %
Brady Statistic AS VS Percent: 99.79 %
Brady Statistic RA Percent Paced: 0.14 %
Brady Statistic RV Percent Paced: 0.07 %
Date Time Interrogation Session: 20220921101637
HighPow Impedance: 93 Ohm
Implantable Lead Implant Date: 20170227
Implantable Lead Implant Date: 20170227
Implantable Lead Location: 753859
Implantable Lead Location: 753860
Implantable Lead Model: 5076
Implantable Pulse Generator Implant Date: 20170227
Lead Channel Impedance Value: 399 Ohm
Lead Channel Impedance Value: 475 Ohm
Lead Channel Impedance Value: 513 Ohm
Lead Channel Pacing Threshold Amplitude: 0.5 V
Lead Channel Pacing Threshold Amplitude: 0.625 V
Lead Channel Pacing Threshold Pulse Width: 0.4 ms
Lead Channel Pacing Threshold Pulse Width: 0.4 ms
Lead Channel Sensing Intrinsic Amplitude: 24.75 mV
Lead Channel Sensing Intrinsic Amplitude: 28.75 mV
Lead Channel Sensing Intrinsic Amplitude: 3.5 mV
Lead Channel Sensing Intrinsic Amplitude: 5 mV
Lead Channel Setting Pacing Amplitude: 2 V
Lead Channel Setting Pacing Amplitude: 2.5 V
Lead Channel Setting Pacing Pulse Width: 0.4 ms
Lead Channel Setting Sensing Sensitivity: 0.3 mV

## 2021-03-27 NOTE — Progress Notes (Signed)
EPIC Encounter for ICM Monitoring  Patient Name: Marie Ramos is a 65 y.o. female Date: 03/27/2021 Primary Care Physican: Lawerance Cruel, MD Primary Cardiologist: Allred Electrophysiologist: Allred 01/16/2021 Weight: 250 lbs   Time in AT/AF  0.0 hr/day (0.0%) (taking Eliquis)         Spoke with patient and heart failure questions reviewed.  Pt asymptomatic for fluid accumulation and feeling well.         Optivol thoracic impedance normal.   Prescribed: Furosemide 20 mg 1 tablet daily   Labs: 09/28/2020 Creatinine 1.14, BUN 28, Potassium 4.6, Sodium 143 A complete set of results can be found in Results Review.   Recommendations:  No changes and encouraged to call if experiencing any fluid symptoms.   Follow-up plan: ICM clinic phone appointment on 04/29/2021.  91 day device clinic remote transmission 05/16/2021.      EP/Cardiology Office Visits: 03/27/2021 with Oda Kilts, PA.    Copy of ICM check sent to Dr. Rayann Heman.      3 month ICM trend: 03/26/2021.    1 Year ICM trend:       [name], RN 03/27/2021 8:10 AM

## 2021-03-27 NOTE — Patient Instructions (Signed)
Medication Instructions:  Your physician recommends that you continue on your current medications as directed. Please refer to the Current Medication list given to you today.  *If you need a refill on your cardiac medications before your next appointment, please call your pharmacy*   Lab Work: None If you have labs (blood work) drawn today and your tests are completely normal, you will receive your results only by: MyChart Message (if you have MyChart) OR A paper copy in the mail If you have any lab test that is abnormal or we need to change your treatment, we will call you to review the results.   Follow-Up: At [facility], you and your health needs are our priority.  As part of our continuing mission to provide you with exceptional heart care, we have created designated Provider Care Teams.  These Care Teams include your primary Cardiologist (physician) and Advanced Practice Providers (APPs -  Physician Assistants and Nurse Practitioners) who all work together to provide you with the care you need, when you need it.   Your next appointment:   6 month(s)  The format for your next appointment:   In Person  Provider:   You may see James Allred, MD or one of the following Advanced Practice Providers on your designated Care Team:   Renee Ursuy, PA-C Michael "Andy" Tillery, PA-C    

## 2021-04-03 DIAGNOSIS — Z0000 Encounter for general adult medical examination without abnormal findings: Secondary | ICD-10-CM | POA: Diagnosis not present

## 2021-04-10 ENCOUNTER — Other Ambulatory Visit: Payer: Self-pay | Admitting: Family Medicine

## 2021-04-10 DIAGNOSIS — E2839 Other primary ovarian failure: Secondary | ICD-10-CM

## 2021-04-16 ENCOUNTER — Other Ambulatory Visit: Payer: Self-pay | Admitting: Physician Assistant

## 2021-04-18 ENCOUNTER — Other Ambulatory Visit: Payer: Self-pay | Admitting: Physician Assistant

## 2021-04-19 NOTE — Telephone Encounter (Signed)
Refill not due till 18th at earliest. Pt has apt that day.

## 2021-04-23 ENCOUNTER — Other Ambulatory Visit: Payer: Self-pay

## 2021-04-23 ENCOUNTER — Ambulatory Visit (INDEPENDENT_AMBULATORY_CARE_PROVIDER_SITE_OTHER): Payer: Medicare HMO | Admitting: Physician Assistant

## 2021-04-23 ENCOUNTER — Encounter: Payer: Self-pay | Admitting: Physician Assistant

## 2021-04-23 DIAGNOSIS — F5105 Insomnia due to other mental disorder: Secondary | ICD-10-CM

## 2021-04-23 DIAGNOSIS — F99 Mental disorder, not otherwise specified: Secondary | ICD-10-CM | POA: Diagnosis not present

## 2021-04-23 DIAGNOSIS — F331 Major depressive disorder, recurrent, moderate: Secondary | ICD-10-CM

## 2021-04-23 DIAGNOSIS — F411 Generalized anxiety disorder: Secondary | ICD-10-CM | POA: Diagnosis not present

## 2021-04-23 DIAGNOSIS — F319 Bipolar disorder, unspecified: Secondary | ICD-10-CM

## 2021-04-23 MED ORDER — VENLAFAXINE HCL ER 75 MG PO CP24: 225.0000 mg | ORAL_CAPSULE | Freq: Every day | ORAL | 0 refills | Status: DC

## 2021-04-23 NOTE — Progress Notes (Signed)
[facility] Check  Patient ID: Marie Ramos,  MRN: [id number]  PCP: Lawerance Cruel, MD  Date of Evaluation: 04/23/2021 Time spent:40 minutes  Chief Complaint:  Chief Complaint   Anxiety; Depression; Follow-up     HISTORY/CURRENT STATUS: For routine med check.  Still having anxiety. Worries about things she shouldn't. No PA, just worries a lot.  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  There is just a generalized sense of unease almost all the time.  She is understandably concerned about her health problems, especially her heart. Has a hard time describing how she is feeling, says "I do not know," a lot.  The Xanax does help.  States she does not enjoy much of anything and energy and motivation are low but she is not sure if that is due to physical or mental issues.  Appetite is normal and states she is not losing any weight.  She does not do anything for fun, watches TV most days.  She does not cry easily.  Not isolating purposefully.  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  No suicidal or homicidal thoughts.  Patient denies increased energy with decreased need for sleep, no increased talkativeness, no racing thoughts, no impulsivity or risky behaviors, no increased spending, no increased libido, no grandiosity, no increased irritability or anger, and no hallucinations.  Review of Systems  Constitutional:  Positive for malaise/fatigue.  HENT: Negative.    Eyes: Negative.   Respiratory: Negative.    Cardiovascular: Negative.   Gastrointestinal: Negative.   Genitourinary: Negative.   Musculoskeletal: Negative.   Skin: Negative.   Neurological: Negative.   Endo/Heme/Allergies: Negative.   Psychiatric/Behavioral:         See HPI    Individual Medical History/ Review of Systems: Changes? :No     Past medications for mental health diagnoses include: Lithium, Trileptal, Effexor  XR,  Allergies: Sulfa antibiotics, Albuterol, Other, Sulfamethoxazole-trimethoprim, Vicodin hp [hydrocodone-acetaminophen], Naproxen, and Vicodin [hydrocodone-acetaminophen]  Current Medications:  Current Outpatient Medications:    ALPRAZolam (XANAX) 0.25 MG tablet, Take 0.5-1 tablets (0.125-0.25 mg total) by mouth 3 (three) times daily as needed for anxiety., Disp: 90 tablet, Rfl: 1   amLODipine (NORVASC) 5 MG tablet, Take 1 tablet (5 mg total) by mouth daily with breakfast., Disp: 30 tablet, Rfl: 0   Ascorbic Acid (VITAMIN C PO), Take 1 capsule by mouth daily., Disp: , Rfl:    atorvastatin (LIPITOR) 20 MG tablet, Take 1 tablet (20 mg total) by mouth daily at 6 PM., Disp: 30 tablet, Rfl: 0   carvedilol (COREG) 12.5 MG tablet, TAKE ONE TABLET BY MOUTH TWICE DAILY WITH BREAKFAST AND DINNER, Disp: 180 tablet, Rfl: 2   DENTAGEL 1.1 % GEL dental gel, Place 1-2 application onto teeth 3 times/day as needed-between meals & bedtime., Disp: , Rfl:    ELIQUIS 5 MG TABS tablet, TAKE ONE TABLET BY MOUTH TWICE DAILY, Disp: 180 tablet, Rfl: 1   famotidine (PEPCID) 20 MG tablet, Take 20 mg by mouth at bedtime., Disp: , Rfl:    fexofenadine (ALLEGRA) 180 MG tablet, Take 180 mg by mouth daily., Disp: , Rfl:    fluocinonide (LIDEX) 0.05 % external solution, as needed., Disp: , Rfl:    fluticasone (FLONASE) 50 MCG/ACT nasal spray, Place 1 spray into both nostrils daily. Use as directed, Disp: , Rfl:    furosemide (LASIX) 20 MG tablet, TAKE ONE TABLET BY MOUTH  ONE TIME DAILY , Disp: 90 tablet, Rfl: 2   losartan (COZAAR) 100 MG tablet, Take 1 tablet (100 mg total) by mouth daily., Disp: 90 tablet, Rfl: 3   Multiple Vitamin (MULTIVITAMIN WITH MINERALS) TABS tablet, Take 1 tablet by mouth daily., Disp: , Rfl:    Multiple Vitamins-Minerals (OCUVITE PO), Take 1 capsule by mouth daily., Disp: , Rfl:    Oxcarbazepine (TRILEPTAL) 300 MG tablet, Take 1.5 tablets (450 mg total) by mouth 2 (two) times daily., Disp: 270  tablet, Rfl: 0   Risankizumab-rzaa (SKYRIZI, 150 MG DOSE, [place]), Inject into the skin every 3 (three) months., Disp: , Rfl:    triamcinolone ointment (KENALOG) 0.5 %, Apply 1 application topically as needed., Disp: , Rfl: 0   venlafaxine XR (EFFEXOR-XR) 75 MG 24 hr capsule, Take 3 capsules (225 mg total) by mouth at bedtime., Disp: 90 capsule, Rfl: 0 Medication Side Effects: none  Family Medical/ Social History: Changes? No  MENTAL HEALTH EXAM:  There were no vitals taken for this visit.There is no height or weight on file to calculate BMI.  General Appearance: Casual, Neat, Well Groomed and Obese  Eye Contact:  Good  Speech:  Clear and Coherent and Normal Rate  Volume:  Normal  Mood:   Melancholy  Affect:  Congruent       Thought Process:  Goal Directed and Descriptions of Associations: Intact  Orientation:  Full (Time, Place, and Person)  Thought Content: Logical   Suicidal Thoughts:  No  Homicidal Thoughts:  No  Memory:  WNL  Judgement:  Good  Insight:  Good  Psychomotor Activity:  Normal  Concentration:  Concentration: Fair and Attention Span: Fair  Recall:  Good  Fund of Knowledge: Good  Language: Good  Assets:  Desire for Improvement  ADL's:  Intact  Cognition: WNL  Prognosis:  Good    DIAGNOSES:    ICD-10-CM   1. Major depressive disorder, recurrent episode, moderate (HCC)  F33.1     2. Generalized anxiety disorder  F41.1     3. Insomnia due to other mental disorder  F51.05    F99     4. Bipolar I disorder ([place])  F31.9       Receiving Psychotherapy: No    RECOMMENDATIONS:  PDMP reviewed. Last Xanax 03/26/21 I provided 40+ minutes of face to face time during this encounter, including time spent before and after the visit in records review, medical decision making, counseling pertinent to this visit, and charting.  I recommend increasing the Effexor XR to a total of 300 mg.  This will help the depression as well as the anxiety.  Because of her heart disease, I  will will double check with her cardiology team before I increase that medication.  If she does not hear back from our office by 04/26/2021, she should call.  I have sent a note to her last cardiology provider and will await their response before making any changes.  She understands. She understands to take the Xanax whenever needed.  As long as its not making her sleepy or confused this will be helpful for the anxiety. Continue Effexor XR 75 mg, 3 p.o. daily.(Until I hear back from cardiology.) Continue Trileptal 300 mg, 1.5 pills p.o. twice daily. Continue Xanax 0.25 mg, 1 p.o. nightly as needed sleep or anxiety. Strongly recommend therapy. Return in 6 weeks.  Avoid drugs that are known to cause prolonged QT interval  Donnal Moat, PA-C

## 2021-04-26 ENCOUNTER — Telehealth: Payer: Self-pay | Admitting: Physician Assistant

## 2021-04-26 NOTE — Telephone Encounter (Signed)
Yes, I just heard back this afternoon.  The PA that she saw the last time stated it is okay to increase the Effexor to a total of 300 mg.  If she needs a prescription sent in let me know, she has a prescription of Effexor XR 75 mg so if she has enough of those she can take 4. The PA wants her to have a cardiology appointment in 10 to 14 days after we increased the dose.  When you talk to her let me know which day she plans to start this and I will let him know.  She should call their office next week to set up the appointment, OR they may call her to set up the appointment.  I am not exactly sure.  But she should follow-up with them either way.  Thank you.

## 2021-04-26 NOTE — Telephone Encounter (Signed)
Pt stated she will start the increase today

## 2021-04-26 NOTE — Telephone Encounter (Signed)
Aislin called and asked if Helene Kelp had gotten hold of her cardiologist yet about her medications. Her phone number is [phone].

## 2021-04-26 NOTE — Telephone Encounter (Signed)
Please review

## 2021-04-28 NOTE — Telephone Encounter (Signed)
Noted. I sent a note to her Cardiology PA, Barrington Ellison, letting him know the increase took place on 10/21 and Manus Gunning will contact their office to set up a follow-up appointment, if she has not heard from them early this week.

## 2021-04-29 ENCOUNTER — Ambulatory Visit (INDEPENDENT_AMBULATORY_CARE_PROVIDER_SITE_OTHER): Payer: Medicare HMO

## 2021-04-29 DIAGNOSIS — I5032 Chronic diastolic (congestive) heart failure: Secondary | ICD-10-CM | POA: Diagnosis not present

## 2021-04-29 DIAGNOSIS — Z95810 Presence of automatic (implantable) cardiac defibrillator: Secondary | ICD-10-CM

## 2021-04-30 NOTE — Progress Notes (Signed)
EPIC Encounter for ICM Monitoring  Patient Name: Marie Ramos is a 65 y.o. female Date: 04/30/2021 Primary Care Physican: Lawerance Cruel, MD Primary Cardiologist: Allred Electrophysiologist: Allred 04/30/2021 Weight: 262 lbs   Time in AT/AF  0.0 hr/day (0.0%) (taking Eliquis)         Spoke with patient and heart failure questions reviewed.  Pt asymptomatic for fluid accumulation and feeling well.         Optivol thoracic impedance suggesting possible fluid accumulation starting 04/24/2021.   Prescribed: Furosemide 20 mg 1 tablet daily   Labs: 09/28/2020 Creatinine 1.14, BUN 28, Potassium 4.6, Sodium 143 A complete set of results can be found in Results Review.   Recommendations:   Advised to take Furosemide 2 tablets x 1 day and then return to prescribed dosage of 1 tablet daily.    Follow-up plan: ICM clinic phone appointment on 06/10/2021 (Fluid levels will be rechecked at 11/2 OV).  91 day device clinic remote transmission 05/16/2021.      EP/Cardiology Office Visits:   05/08/2021 with Tommye Standard, PA.    Copy of ICM check sent to Dr. Rayann Heman.    3 month ICM trend: 04/29/2021.    1 Year ICM trend:       [name], RN 04/30/2021 4:20 PM

## 2021-05-05 NOTE — Progress Notes (Signed)
Patient ID: Marie Ramos, female   DOB: [birth year], 65 y.o.   MRN: [id number]    Cardiology Office Note Date:  05/05/2021  Patient ID:  Marie, Ramos [birth year], MRN [id number] PCP:  Lawerance Cruel, MD  Cardiologist:  None prior to hospital, (new) Dr. Burt Knack Electrophysiologist: Dr. Rayann Heman    Chief Complaint:  6 mo visit   History of Present Illness: Marie Ramos is a 65 y.o. female with history of cardiac arrest 08/24/15, she was at work, AED was immediately applied and recommended and received 2 shocks and CPR, FR found her in PEA with ROSC after epinephrine, very recently found to have AFib via remote device check.   08/24/15: presumd VF arrest with AED advising shocks, CPR, PEA , she had emergent cardiac cath with no CAD, long QT on her EKG noted. Hypokalemic noted as well.   She underwent hypothermia protocol.  Suspected her psych meds were the etiology pf her QT prolongation.  Echo suggested possible HCM,, MRI revealed hypertensive CM, not HCM.  MRI 2/26 noted CVA, neuro on case, possibly related to cath procedure, not recommended to have TEE or further w/u.  She underwent ICD implant  By Dr. Rayann Heman on 09/04/15.  Psychiatry was consulted and aided in her medications, to avoid QT prolonging meds. Discharged from CIR on the current regime. She developed DVT to LLE started on Eliquis.  She was discharged to CIR until 09/14/15 and has undergone out patient rehab since then.  Is now off a/c   I saw her 02/13/20 She is doing well.  Noticed she is more aware of her device (can feel it when laying down and touches her chest).  No CO, palpitations or cardiac awareness.  She has "terrible knees" and has been recommended to have them replaced, but she does not want to do.  They limit her ability to exercise, she walks some for exercise and feels like she has good exertional capacity. No dizzy spells, no syncope.  No Shocks She was by her eye doctor not too long ago and he told her to  tell us he thought she should have the artieries in her neck checked.  He did not mention any signs of blood supply issue that she understood, she had mentioned having floaters intermittently and red eyes. She is looking forward to her niece's wedding, mentioned that guests will either need to be vaccinated or have a negative test within a week of the event. BP was elevated, and was encouraged to monitor at home for a few weeks and let me know the readings for adjustment if needed in her meds.  In the interim she has been found again with AFib via her device.  She saw [name], NP 08/16/20 at the AF clinic, revisited indication for a/c and the patient agreeable to start. Doing well at a 2 week f/u without notable issues with her [place].  I saw her 09/28/20 She is doing well. She and her husband are moving soon to a one story c ondo this will be very good for them. She denies any CP, palpitations, no cardiac awraeness. No SOB No dizzy spells, near syncope ro syncope, no shocks No bleeding or signs of bleeding, tolerating a/c  No AF or VT No changes were made  She saw A. Tillery, PA-C 03/27/21, she was at her baseline with no new/unusual SOB, no changes were made.  Planned for 6 mo visit   More recently she saw [facility] and recommended  increasing her Effexor, in d/w [place], planned to come in for an EKG post dose increase  TODAY She is doing "OK", mentions they may want to add Buspar at her next visit. She feels like she has a lotof anxiety. Cardiac-wise, she is doing OK Says she was called and told she was retaining fluid, but felt well and has not had SOB. She took an extra lasix the one day as instructed No CP, palpitations No near syncope or syncope. No bleeding or signs of bleeding  Device information: MDT dual chamber ICD implanted 09/03/15, Dr. Rayann Heman, secondary prevention/VF arrest  Past Medical History:  Diagnosis Date   Abnormality of gait    Acute bilat watershed infarction [facility])     Acute lower UTI    Acute metabolic encephalopathy 11/05/8839   Acute respiratory failure (HCC)    Acute systolic congestive heart failure ([place])    AKI (acute kidney injury) ([place])    Anoxic brain damage (HCC)    Bipolar 1 disorder (HCC)    Bipolar affective disorder in remission ([place])    Cardiac arrest ([place]) 08/24/2015   Cerebral infarction due to embolism of cerebral artery ([place])    Cerebrovascular accident (CVA) due to embolism of cerebral artery ([place]) 12/05/2015   Chronically dry eyes    Closed fracture of left distal radius 12/01/2018   DVT (deep venous thrombosis) ([place]) 12/05/2015   Dyspnea    r/t seasonal allergies   Hypertrophic obstructive cardiomyopathy ([place]) 08/29/2015   Leukocytosis    Macular degeneration    Pneumonia due to COVID-19 virus 08/16/2019   Pulmonary vascular congestion 08/16/2019   QT prolongation 08/29/2015   Stroke ([place])    Systolic dysfunction with acute on chronic heart failure (HCC)    TBI (traumatic brain injury) 2011   Inpatient rehab [facility]   Thrombocytosis    Ventricular fibrillation ([place]) 09/03/15   MDT ICD Dr. Rayann Heman    Past Surgical History:  Procedure Laterality Date   ABDOMINAL HYSTERECTOMY     CARDIAC CATHETERIZATION N/A 08/24/2015   Procedure: Left Heart Cath and Coronary Angiography;  Surgeon: Sherren Mocha, MD;  Location: [place] CV [facility];  Service: Cardiovascular;  Laterality: N/A;   EP IMPLANTABLE DEVICE N/A 09/03/2015   MDT dual chamber ICD   OPEN REDUCTION INTERNAL FIXATION (ORIF) DISTAL RADIAL FRACTURE Left 12/01/2018   Procedure: OPEN REDUCTION INTERNAL FIXATION (ORIF) LEFT DISTAL RADIAL FRACTURE;  Surgeon: Hiram Gash, MD;  Location: [facility];  Service: Orthopedics;  Laterality: Left;   SPLENECTOMY, TOTAL  2011   TONSILLECTOMY      Current Outpatient Medications  Medication Sig Dispense Refill   ALPRAZolam (XANAX) 0.25 MG tablet Take 0.5-1 tablets (0.125-0.25 mg total) by mouth 3 (three) times daily as needed for anxiety. 90 tablet 1    amLODipine (NORVASC) 5 MG tablet Take 1 tablet (5 mg total) by mouth daily with breakfast. 30 tablet 0   Ascorbic Acid (VITAMIN C PO) Take 1 capsule by mouth daily.     atorvastatin (LIPITOR) 20 MG tablet Take 1 tablet (20 mg total) by mouth daily at 6 PM. 30 tablet 0   carvedilol (COREG) 12.5 MG tablet TAKE ONE TABLET BY MOUTH TWICE DAILY WITH BREAKFAST AND DINNER 180 tablet 2   DENTAGEL 1.1 % GEL dental gel Place 1-2 application onto teeth 3 times/day as needed-between meals & bedtime.     ELIQUIS 5 MG TABS tablet TAKE ONE TABLET BY MOUTH TWICE DAILY 180 tablet 1   famotidine (PEPCID) 20 MG  tablet Take 20 mg by mouth at bedtime.     fexofenadine (ALLEGRA) 180 MG tablet Take 180 mg by mouth daily.     fluocinonide (LIDEX) 0.05 % external solution as needed.     fluticasone (FLONASE) 50 MCG/ACT nasal spray Place 1 spray into both nostrils daily. Use as directed     furosemide (LASIX) 20 MG tablet TAKE ONE TABLET BY MOUTH ONE TIME DAILY  90 tablet 2   losartan (COZAAR) 100 MG tablet Take 1 tablet (100 mg total) by mouth daily. 90 tablet 3   Multiple Vitamin (MULTIVITAMIN WITH MINERALS) TABS tablet Take 1 tablet by mouth daily.     Multiple Vitamins-Minerals (OCUVITE PO) Take 1 capsule by mouth daily.     Oxcarbazepine (TRILEPTAL) 300 MG tablet Take 1.5 tablets (450 mg total) by mouth 2 (two) times daily. 270 tablet 0   Risankizumab-rzaa (SKYRIZI, 150 MG DOSE, [place]) Inject into the skin every 3 (three) months.     triamcinolone ointment (KENALOG) 0.5 % Apply 1 application topically as needed.  0   venlafaxine XR (EFFEXOR-XR) 75 MG 24 hr capsule Take 3 capsules (225 mg total) by mouth at bedtime. 90 capsule 0   No current facility-administered medications for this visit.    Allergies:   Sulfa antibiotics, Albuterol, Other, Sulfamethoxazole-trimethoprim, Vicodin hp [hydrocodone-acetaminophen], Naproxen, and Vicodin [hydrocodone-acetaminophen]   Social History:  The patient  reports that she has  never smoked. She has never used smokeless tobacco. She reports that she does not drink alcohol and does not use drugs.   Family History:  The patient's family history includes Arthritis/Rheumatoid in her paternal grandmother; CAD in her mother; COPD in her father; Cancer in her mother; Depression in her father, maternal grandfather, and mother; Diabetes in her maternal grandfather.  ROS:  Please see the history of present illness.    All other systems are reviewed and otherwise negative.   PHYSICAL EXAM:  VS:  There were no vitals taken for this visit. BMI: There is no height or weight on file to calculate BMI. Well nourished, well developed, in no acute distress  HEENT: normocephalic, atraumatic  Neck: no JVD, carotid bruits or masses Cardiac: RRR; no significant murmurs, no rubs, or gallops Lungs:  CTA b/l , no wheezing, rhonchi or rales  Abd: soft, nontender, central obesity MS: no deformity or atrophy Ext:  no edema  Skin: warm and dry, psoriasis noted b/l LE Neuro:  No gross deficits appreciated Psych: euthymic mood, full affect  ICD site is stable,well healed, no tethering or discomfort   EKG:  done today and reviewed by myself SR 81bpm, 1st degree Avblock 244ms, QTc 433ms 2.23/22 personally reviewed, SR 80bpm, 1st degree AVBlock, 251ms, QTc 468ms   ICD interrogation today and reviewed by myself.  Battery and lead measurements are good No arrhythmias   08/17/19: TTE IMPRESSIONS   1. Left ventricular ejection fraction, by estimation, is 65 to 70%. The  left ventricle has normal function. There is moderately increased left  ventricular hypertrophy of the basal septum. LV diastolic function not  assessed.   2. Right ventricular systolic function was not well visualized. The right  ventricular size is not well visualized. Tricuspid regurgitation signal is  inadequate for assessing PA pressure.   3. The mitral valve is normal in structure and function. no evidence of   mitral valve regurgitation. No evidence of mitral stenosis.   4. The aortic valve was not well visualized. Aortic valve regurgitation  is trivial.  5. The inferior vena cava is normal in size with <50% respiratory  variability, suggesting right atrial pressure of 8 mmHg.   6. No left atrial/left atrial appendage thrombus was detected.   Conclusion(s)/Recomendation(s): Poor windows for evaluation of left  ventricular function regional wall motion by transthoracic  echocardiography. Would recommend limited study with definity if  clinically indicated.    07/14/16: TTE Study Conclusions - Left ventricle: The cavity size was normal. Wall thickness was   increased in a pattern of mild LVH. Systolic function was normal.   The estimated ejection fraction was in the range of 55% to 60%.   Wall motion was normal; there were no regional wall motion   abnormalities. Doppler parameters are consistent with abnormal   left ventricular relaxation (grade 1 diastolic dysfunction). - Aortic valve: There was no stenosis. - Mitral valve: Mildly calcified annulus. There was no significant   regurgitation. - Right ventricle: The cavity size was normal. Pacer wire or   catheter noted in right ventricle. Systolic function was normal. - Pulmonary arteries: No complete TR doppler jet so unable to   estimate PA systolic pressure. - Inferior vena cava: The vessel was normal in size. The   respirophasic diameter changes were in the normal range (= 50%),   consistent with normal central venous pressure. Impressions: - Normal LV size with mild LV hypertrophy. EF 55-60%. Normal RV   size and systolic function. No significant valvular   abnormalities.  08/24/15: Echocardiogram:  Study Conclusions  - Left ventricle: The cavity size was normal. Systolic function was   mildly to moderately reduced. The estimated ejection fraction was   in the range of 40% to 45%. There is severe asymmetric septal   hypetrophy  without dynamic obstruction. Findings suggestive of   hypertrophic nonobstructive cardiomyopathy. Diffuse hypokinesis.   Doppler parameters are consistent with abnormal left ventricular   relaxation (grade 1 diastolic dysfunction). Septal thickness, ED   (2D): 17 mm. Posterior wall thickness, ED (2D): 10.5 mm. - Technically difficult study.  Impressions:  - Technically difficult study, however appears to have asymmetric   septal hypertrophic cardiomyoapathy without obstruction. In   setting of VF arrest, would consider cardiac MRI to better   evaluate as this could be the potential etiology of her   arrhythmia.  08/24/15: LHC Conclusion     Widely patent coronary arteries Normal LVEDP       Recent Labs: 09/28/2020: BUN 28; Creatinine, Ser 1.14; Hemoglobin 12.7; Platelets 495; Potassium 4.6; Sodium 143  No results found for requested labs within last 8760 hours.   CrCl cannot be calculated (Patient's most recent lab result is older than the maximum 21 days allowed.).   Wt Readings from Last 3 Encounters:  03/27/21 262 lb 6.4 oz (119 kg)  09/28/20 270 lb 3.2 oz (122.6 kg)  08/29/20 271 lb 3.2 oz (123 kg)     Other studies reviewed: Additional studies/records reviewed today include: summarized above    ASSESSMENT AND PLAN:  1. VF arrest, QT prolongation felt secondary to psych meds 2. ICD     Meds reviewed with RPH and discussed Buspar, should not affect QT     QTc stable with uptitration of Effexor     Intact ICD function, no programming changes made      3. Bipolar     Continue f/u with psych      Avoid QT prolonging medications and caution with antipsychotics in the future      She is aware  4. HTN 5. hypertensive CM, chronic CHF (disatolic)       Looks OK      OptiVol is below threshold though increasing      No symptoms or exam findings of volume OL Weight is down       No changes        6. Paroxysmal AFib     SCAF    CHA2DS2Vasc is  4      she does  have prior stroke, though she reports this was in the environment of her cardiac arrest, notes report suspect 2/2 LHC procedure     On Eliquis, appropriately dosed by last labs     0 % burden     Reports recent labs by PMD, will request a copy         Disposition:  remotes as usual, in clinic visit in 71mo, sooner if needed    Current medicines are reviewed at length with the patient today.  The patient did not have any concerns regarding medicines.  Haywood Lasso, PA-C 05/05/2021 3:01 PM     [place] [address] [phone] (office)  [phone] (fax)

## 2021-05-08 ENCOUNTER — Other Ambulatory Visit: Payer: Self-pay

## 2021-05-08 ENCOUNTER — Encounter: Payer: Self-pay | Admitting: Physician Assistant

## 2021-05-08 ENCOUNTER — Telehealth: Payer: Self-pay | Admitting: Physician Assistant

## 2021-05-08 ENCOUNTER — Ambulatory Visit: Payer: Medicare HMO | Admitting: Physician Assistant

## 2021-05-08 VITALS — BP 138/84 | HR 81 | Ht 62.0 in | Wt 258.0 lb

## 2021-05-08 DIAGNOSIS — I6340 Cerebral infarction due to embolism of unspecified cerebral artery: Secondary | ICD-10-CM

## 2021-05-08 DIAGNOSIS — Z95810 Presence of automatic (implantable) cardiac defibrillator: Secondary | ICD-10-CM

## 2021-05-08 DIAGNOSIS — Z79899 Other long term (current) drug therapy: Secondary | ICD-10-CM

## 2021-05-08 DIAGNOSIS — I469 Cardiac arrest, cause unspecified: Secondary | ICD-10-CM | POA: Diagnosis not present

## 2021-05-08 DIAGNOSIS — I428 Other cardiomyopathies: Secondary | ICD-10-CM

## 2021-05-08 DIAGNOSIS — I480 Paroxysmal atrial fibrillation: Secondary | ICD-10-CM

## 2021-05-08 DIAGNOSIS — I421 Obstructive hypertrophic cardiomyopathy: Secondary | ICD-10-CM | POA: Diagnosis not present

## 2021-05-08 LAB — CUP PACEART INCLINIC DEVICE CHECK
Battery Remaining Longevity: 38 mo
Battery Voltage: 2.96 V
Brady Statistic AP VP Percent: 0 %
Brady Statistic AP VS Percent: 1.27 %
Brady Statistic AS VP Percent: 0.07 %
Brady Statistic AS VS Percent: 98.66 %
Brady Statistic RA Percent Paced: 1.28 %
Brady Statistic RV Percent Paced: 0.07 %
Date Time Interrogation Session: 20221102162328
HighPow Impedance: 90 Ohm
Implantable Lead Implant Date: 20170227
Implantable Lead Implant Date: 20170227
Implantable Lead Location: 753859
Implantable Lead Location: 753860
Implantable Lead Model: 5076
Implantable Pulse Generator Implant Date: 20170227
Lead Channel Impedance Value: 399 Ohm
Lead Channel Impedance Value: 418 Ohm
Lead Channel Impedance Value: 513 Ohm
Lead Channel Pacing Threshold Amplitude: 0.5 V
Lead Channel Pacing Threshold Amplitude: 0.5 V
Lead Channel Pacing Threshold Pulse Width: 0.4 ms
Lead Channel Pacing Threshold Pulse Width: 0.4 ms
Lead Channel Sensing Intrinsic Amplitude: 2.875 mV
Lead Channel Sensing Intrinsic Amplitude: 22.5 mV
Lead Channel Sensing Intrinsic Amplitude: 24.125 mV
Lead Channel Sensing Intrinsic Amplitude: 3.25 mV
Lead Channel Setting Pacing Amplitude: 2 V
Lead Channel Setting Pacing Amplitude: 2.5 V
Lead Channel Setting Pacing Pulse Width: 0.4 ms
Lead Channel Setting Sensing Sensitivity: 0.3 mV

## 2021-05-08 MED ORDER — APIXABAN 5 MG PO TABS: 5.0000 mg | ORAL_TABLET | Freq: Two times a day (BID) | ORAL | 5 refills | Status: DC

## 2021-05-08 NOTE — Telephone Encounter (Signed)
Pt called and said that the cardiologist said it was ok to take the buspar and she also needs a refill on her xanax [facility]

## 2021-05-08 NOTE — Telephone Encounter (Signed)
Pended xanax

## 2021-05-08 NOTE — Patient Instructions (Signed)
Medication Instructions:   Your physician recommends that you continue on your current medications as directed. Please refer to the Current Medication list given to you today.  *If you need a refill on your cardiac medications before your next appointment, please call your pharmacy*   Lab Work: [place]   If you have labs (blood work) drawn today and your tests are completely normal, you will receive your results only by: [place] (if you have MyChart) OR A paper copy in the mail If you have any lab test that is abnormal or we need to change your treatment, we will call you to review the results.   Testing/Procedures: NONE ORDERED  TODAY     Follow-Up: At [facility] you and your health needs are our priority.  As part of our continuing mission to provide you with exceptional heart care, we have created designated Provider Care Teams.  These Care Teams include your primary Cardiologist (physician) and Advanced Practice Providers (APPs -  Physician Assistants and Nurse Practitioners) who all work together to provide you with the care you need, when you need it.  We recommend signing up for the patient portal called "MyChart".  Sign up information is provided on this After Visit Summary.  MyChart is used to connect with patients for Virtual Visits (Telemedicine).  Patients are able to view lab/test results, encounter notes, upcoming appointments, etc.  Non-urgent messages can be sent to your provider as well.   To learn more about what you can do with MyChart, go to [email or web address].    Your next appointment:   6 month(s)  The format for your next appointment:   In Person  Provider:   You will see one of the following Advanced Practice Providers on your designated Care Team:   Tommye Standard, [place]      Other Instructions

## 2021-05-09 MED ORDER — ALPRAZOLAM 0.25 MG PO TABS
0.1250 mg | ORAL_TABLET | Freq: Three times a day (TID) | ORAL | 0 refills | Status: DC | PRN
Start: 2021-05-09 — End: 2021-06-03

## 2021-05-09 NOTE — Telephone Encounter (Signed)
Please let her know that I do not want to start the BuSpar yet.  I am hoping that increasing the Effexor will be all she needs to control the anxiety.  But it is good to know that she can take BuSpar if we need it.  We just increased the Effexor a week or 2 ago, so that is not enough time to reach maximum efficacy yet. Thank you.

## 2021-05-09 NOTE — Telephone Encounter (Signed)
Pt informed

## 2021-05-20 DIAGNOSIS — H02423 Myogenic ptosis of bilateral eyelids: Secondary | ICD-10-CM | POA: Diagnosis not present

## 2021-05-20 DIAGNOSIS — H353132 Nonexudative age-related macular degeneration, bilateral, intermediate dry stage: Secondary | ICD-10-CM | POA: Diagnosis not present

## 2021-05-20 DIAGNOSIS — H2513 Age-related nuclear cataract, bilateral: Secondary | ICD-10-CM | POA: Diagnosis not present

## 2021-05-20 DIAGNOSIS — H43393 Other vitreous opacities, bilateral: Secondary | ICD-10-CM | POA: Diagnosis not present

## 2021-05-23 ENCOUNTER — Telehealth: Payer: Self-pay | Admitting: Physician Assistant

## 2021-05-23 ENCOUNTER — Other Ambulatory Visit: Payer: Self-pay | Admitting: Physician Assistant

## 2021-05-23 ENCOUNTER — Other Ambulatory Visit: Payer: Self-pay

## 2021-05-23 MED ORDER — VENLAFAXINE HCL ER 150 MG PO CP24: 300.0000 mg | ORAL_CAPSULE | Freq: Every day | ORAL | 1 refills | Status: DC

## 2021-05-23 NOTE — Telephone Encounter (Signed)
Prescription was sent

## 2021-05-23 NOTE — Telephone Encounter (Signed)
Should the new rx be for 2 of the 150 mg tabs daily?

## 2021-05-23 NOTE — Telephone Encounter (Signed)
Pt requesting new Rx for Effexor 300 mg 1/d at [facility]. She has been taking 4 of the 75 mg daily. Provider canc apt 11/30. On canc list. Contact Pt @ [phone] if needed.

## 2021-05-23 NOTE — Telephone Encounter (Signed)
Pt requesting new Rx for EFFEXOR XR 300 mg caps 1/d @ [facility]. Have been taking 4 of the 75 mg =300 mg . Seems to be working well. Provider canc apt 11/30. On canc list. Pt # [phone]

## 2021-05-27 ENCOUNTER — Telehealth: Payer: Self-pay | Admitting: Internal Medicine

## 2021-05-27 MED ORDER — APIXABAN 5 MG PO TABS: 5.0000 mg | ORAL_TABLET | Freq: Two times a day (BID) | ORAL | 5 refills | Status: DC

## 2021-05-27 NOTE — Telephone Encounter (Signed)
Eliquis 5 mg refill request received. Patient is 65 years old, weight- 117 kg, Crea- 1.14 on 09/28/20, Diagnosis-PAF, and last seen by Tommye Standard, PA on 05/08/21. Dose is appropriate based on dosing criteria. Will send in refill to requested pharmacy.

## 2021-05-27 NOTE — Telephone Encounter (Signed)
*  STAT* If patient is at the pharmacy, call can be transferred to refill team.   1. Which medications need to be refilled? (please list name of each medication and dose if known) apixaban (ELIQUIS) 5 MG TABS tablet  2. Which pharmacy/location (including street and city if local pharmacy) is medication to be sent to? [facility] # [place], [place]  3. Do they need a 30 day or 90 day supply? [place]

## 2021-06-03 ENCOUNTER — Other Ambulatory Visit: Payer: Self-pay

## 2021-06-03 ENCOUNTER — Encounter: Payer: Self-pay | Admitting: Physician Assistant

## 2021-06-03 ENCOUNTER — Ambulatory Visit: Payer: Medicare HMO | Admitting: Physician Assistant

## 2021-06-03 DIAGNOSIS — F319 Bipolar disorder, unspecified: Secondary | ICD-10-CM

## 2021-06-03 DIAGNOSIS — F411 Generalized anxiety disorder: Secondary | ICD-10-CM | POA: Diagnosis not present

## 2021-06-03 DIAGNOSIS — F99 Mental disorder, not otherwise specified: Secondary | ICD-10-CM

## 2021-06-03 DIAGNOSIS — F5105 Insomnia due to other mental disorder: Secondary | ICD-10-CM | POA: Diagnosis not present

## 2021-06-03 MED ORDER — ALPRAZOLAM 0.25 MG PO TABS
0.1250 mg | ORAL_TABLET | Freq: Three times a day (TID) | ORAL | 5 refills | Status: DC | PRN
Start: 2021-06-03 — End: 2021-10-20

## 2021-06-03 MED ORDER — VENLAFAXINE HCL ER 150 MG PO CP24: 300.0000 mg | ORAL_CAPSULE | Freq: Every evening | ORAL | 1 refills | Status: DC

## 2021-06-03 MED ORDER — GINKGO BILOBA 40 MG PO TABS: 40.0000 mg | ORAL_TABLET | Freq: Two times a day (BID) | ORAL | 11 refills | Status: DC

## 2021-06-03 MED ORDER — OXCARBAZEPINE 300 MG PO TABS: 450.0000 mg | ORAL_TABLET | Freq: Two times a day (BID) | ORAL | 5 refills | Status: DC

## 2021-06-03 MED ORDER — NAC 600 MG PO CAPS: 600.0000 mg | ORAL_CAPSULE | Freq: Every day | ORAL | 11 refills | Status: DC

## 2021-06-03 NOTE — Progress Notes (Signed)
[facility]  Patient ID: Marie Ramos,  MRN: [id number]  PCP: Lawerance Cruel, MD  Date of Evaluation: 06/03/2021 Time spent:20 minutes  Chief Complaint:  Chief Complaint   Anxiety; Depression; Follow-up      HISTORY/CURRENT STATUS: For routine med check.  Effexor XR was increased about a month ago.  I discussed this with her cardiologist prior to increasing it.  When asked if she thinks it has been helpful, she said "I guess so" and shrugged her shoulders.  Stated she enjoyed Thanksgiving at her daughter's house and being around her 11-year-old granddaughter.  She still enjoys watching TV but is not isolating.  Does not cry easily.  Energy and motivation are still low but improved from last time.  Still gets tired easily, like if she has to go to the grocery store and walk up and down the aisles.  Her husband does all of the shopping now but they do help each other with the laundry and things like that.  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  She does want to work.  Sleep is still a problem but she takes 2 Xanax at night so she can relax and go to sleep.  No suicidal or homicidal thoughts.  States that her husband feels like she is more forgetful.  She does not remember him saying something and she will ask about it, then he says he already told her about what ever situation it is.  She does notice that but does not feel like it is worse than any other time.  Patient denies increased energy with decreased need for sleep, no increased talkativeness, no racing thoughts, no impulsivity or risky behaviors, no increased spending, no increased libido, no grandiosity, no increased irritability or anger, and no hallucinations.  Review of Systems  Constitutional:  Positive for malaise/fatigue.  HENT: Negative.    Eyes: Negative.    Respiratory: Negative.    Cardiovascular: Negative.   Gastrointestinal: Negative.   Genitourinary: Negative.   Musculoskeletal: Negative.   Skin: Negative.   Neurological: Negative.   Endo/Heme/Allergies: Negative.   Psychiatric/Behavioral:         See HPI    Individual Medical History/ Review of Systems: Changes? :No     Past medications for mental health diagnoses include: Lithium, Trileptal, Effexor XR,  Allergies: Sulfa antibiotics, Albuterol, Other, Sulfamethoxazole-trimethoprim, Vicodin hp [hydrocodone-acetaminophen], Naproxen, and Vicodin [hydrocodone-acetaminophen]  Current Medications:  Current Outpatient Medications:    Acetylcysteine (NAC) 600 MG CAPS, Take 1 capsule (600 mg total) by mouth daily., Disp: 30 capsule, Rfl: 11   amLODipine (NORVASC) 5 MG tablet, Take 1 tablet (5 mg total) by mouth daily with breakfast., Disp: 30 tablet, Rfl: 0   apixaban (ELIQUIS) 5 MG TABS tablet, Take 1 tablet (5 mg total) by mouth 2 (two) times daily., Disp: 30 tablet, Rfl: 5   Ascorbic Acid (VITAMIN C PO), Take 1 capsule by mouth daily., Disp: , Rfl:    atorvastatin (LIPITOR) 20 MG tablet, Take 1 tablet (20 mg total) by mouth daily at 6 PM., Disp: 30 tablet, Rfl: 0   carvedilol (COREG) 12.5 MG tablet, TAKE ONE TABLET BY MOUTH TWICE DAILY WITH BREAKFAST AND DINNER, Disp: 180 tablet, Rfl: 2   famotidine (PEPCID) 20 MG tablet, Take 20 mg by mouth at bedtime., Disp: , Rfl:  fexofenadine (ALLEGRA) 180 MG tablet, Take 180 mg by mouth daily., Disp: , Rfl:    fluocinonide (LIDEX) 0.05 % external solution, as needed., Disp: , Rfl:    fluticasone (FLONASE) 50 MCG/ACT nasal spray, Place 1 spray into both nostrils daily. Use as directed, Disp: , Rfl:    furosemide (LASIX) 20 MG tablet, TAKE ONE TABLET BY MOUTH ONE TIME DAILY , Disp: 90 tablet, Rfl: 2   Ginkgo Biloba 40 MG TABS, Take 1 tablet (40 mg total) by mouth 2 (two) times daily., Disp: 180 tablet, Rfl: 11   losartan (COZAAR) 100 MG tablet,  Take 1 tablet (100 mg total) by mouth daily., Disp: 90 tablet, Rfl: 3   Multiple Vitamin (MULTIVITAMIN WITH MINERALS) TABS tablet, Take 1 tablet by mouth daily., Disp: , Rfl:    Multiple Vitamins-Minerals (OCUVITE PO), Take 1 capsule by mouth daily., Disp: , Rfl:    triamcinolone ointment (KENALOG) 0.5 %, Apply 1 application topically as needed., Disp: , Rfl: 0   ALPRAZolam (XANAX) 0.25 MG tablet, Take 0.5-1 tablets (0.125-0.25 mg total) by mouth 3 (three) times daily as needed for anxiety., Disp: 90 tablet, Rfl: 5   Oxcarbazepine (TRILEPTAL) 300 MG tablet, Take 1.5 tablets (450 mg total) by mouth 2 (two) times daily., Disp: 90 tablet, Rfl: 5   Risankizumab-rzaa (SKYRIZI, 150 MG DOSE, [place]), Inject into the skin every 3 (three) months. (Patient not taking: Reported on 05/08/2021), Disp: , Rfl:    venlafaxine XR (EFFEXOR-XR) 150 MG 24 hr capsule, Take 2 capsules (300 mg total) by mouth at bedtime., Disp: 180 capsule, Rfl: 1 Medication Side Effects: none  Family Medical/ Social History: Changes? No  MENTAL HEALTH EXAM:  There were no vitals taken for this visit.There is no height or weight on file to calculate BMI.  General Appearance: Casual, Neat, Well Groomed and Obese  Eye Contact:  Good  Speech:  Clear and Coherent and Normal Rate  Volume:  Normal  Mood:  Euthymic  Affect:  Congruent       Thought Process:  Goal Directed and Descriptions of Associations: Intact  Orientation:  Full (Time, Place, and Person)  Thought Content: Logical   Suicidal Thoughts:  No  Homicidal Thoughts:  No  Memory:  Immediate;   Fair Recent;   Fair Remote;   Good  Judgement:  Good  Insight:  Good  Psychomotor Activity:  Normal  Concentration:  Concentration: Fair and Attention Span: Fair  Recall:  Good  Fund of Knowledge: Good  Language: Good  Assets:  Desire for Improvement  ADL's:  Intact  Cognition: WNL  Prognosis:  Good    DIAGNOSES:    ICD-10-CM   1. Bipolar I disorder ([place])  F31.9     2.  Generalized anxiety disorder  F41.1     3. Insomnia due to other mental disorder  F51.05    F99        Receiving Psychotherapy: No    RECOMMENDATIONS:  PDMP reviewed. Last Xanax 05/09/2021 I provided 20 minutes of face to face time during this encounter, including time spent before and after the visit in records review, medical decision making, counseling pertinent to today's visit, and charting.  I think she is improving, is more upbeat and enjoying things that she previously enjoyed.  No changes in treatment at this time. Continued good sleep hygiene. Continue XR 150 mg, 2 p.o. nightly. Continue Trileptal 300 mg, 1.5 pills p.o. twice daily. Continue Xanax 0.25 mg, 1 p.o. nightly as needed sleep or  anxiety. Strongly recommend therapy. Return in 2 months.  Avoid drugs that are known to cause prolonged QT interval  Donnal Moat, PA-C

## 2021-06-05 ENCOUNTER — Ambulatory Visit: Payer: Medicare HMO | Admitting: Physician Assistant

## 2021-06-10 ENCOUNTER — Ambulatory Visit (INDEPENDENT_AMBULATORY_CARE_PROVIDER_SITE_OTHER): Payer: Medicare HMO

## 2021-06-10 DIAGNOSIS — I5032 Chronic diastolic (congestive) heart failure: Secondary | ICD-10-CM

## 2021-06-10 DIAGNOSIS — Z95810 Presence of automatic (implantable) cardiac defibrillator: Secondary | ICD-10-CM | POA: Diagnosis not present

## 2021-06-11 ENCOUNTER — Telehealth: Payer: Self-pay | Admitting: Internal Medicine

## 2021-06-11 MED ORDER — APIXABAN 5 MG PO TABS: 5.0000 mg | ORAL_TABLET | Freq: Two times a day (BID) | ORAL | 5 refills | Status: DC

## 2021-06-11 NOTE — Telephone Encounter (Signed)
Eliquis 5mg  refill request received. Patient is 65 years old, weight-117kg, Crea-1.14 on 09/28/2020, Diagnosis-Afib, DVT, & CVA and last seen by Tommye Standard on 05/08/2021. Dose is appropriate based on dosing criteria. Will send in refill to requested pharmacy.   Eliquis refill was sent on 05/27/2021 for 30 tabs with 5 refills to [facility].  This message is requesting to be sent to [facility] and co pay card/coupon; will send message back to refills since they have copay/coupon cards.

## 2021-06-11 NOTE — Telephone Encounter (Signed)
  *  STAT* If patient is at the pharmacy, call can be transferred to refill team.   1. Which medications need to be refilled? (please list name of each medication and dose if known) apixaban (ELIQUIS) 5 MG TABS tablet  2. Which pharmacy/location (including street and city if local pharmacy) is medication to be sent to?[facility] # [place], [place]  3. Do they need a 30 day or 90 day supply? 30 days ( 60 tablets)  Pt only got 15 days worth of eliquis, she also requesting if we can provide co-pay card or coupon for this meds

## 2021-06-11 NOTE — Telephone Encounter (Signed)
Called pt to inform her that is she doesn't have Pharmacist, community, that she would not be able to use the copay card, pt stated that she has medicare. I advised the pt that her medication has already been sent to her pharmacy as requested. I advised the pt that if she has any other problems, questions or concerns, to give our office a call back. Pt verbalized understanding.

## 2021-06-12 ENCOUNTER — Telehealth: Payer: Self-pay

## 2021-06-12 NOTE — Telephone Encounter (Signed)
Remote ICM transmission received.  Attempted call to patient regarding ICM remote transmission and number not in service.

## 2021-06-12 NOTE — Progress Notes (Signed)
EPIC Encounter for ICM Monitoring  Patient Name: Marie Ramos is a 65 y.o. female Date: 06/12/2021 Primary Care Physican: Lawerance Cruel, MD Primary Cardiologist: Allred Electrophysiologist: Allred 04/30/2021 Weight: 262 lbs   Time in AT/AF  0.0 hr/day (0.0%) (taking Eliquis)         Attempted call to patient and unable to reach.  Transmission reviewed.          Optivol thoracic impedance suggesting possible fluid accumulation starting 04/24/2021.   Prescribed: Furosemide 20 mg 1 tablet daily   Labs: 09/28/2020 Creatinine 1.14, BUN 28, Potassium 4.6, Sodium 143 A complete set of results can be found in Results Review.   Recommendations:   Unable to reach.     Follow-up plan: ICM clinic phone appointment on 07/15/2021.  91 day device clinic remote transmission 08/15/2021.      EP/Cardiology Office Visits:  Recall 11/04/2021 with Tommye Standard, PA.    Copy of ICM check sent to Dr. Rayann Heman.    3 month ICM trend: 06/10/2021.    12-14 Month ICM trend:       [name], RN 06/12/2021 10:18 AM

## 2021-06-27 ENCOUNTER — Telehealth: Payer: Self-pay

## 2021-06-27 NOTE — Telephone Encounter (Signed)
Received voice mail message stating she received the letter that Dr Rayann Heman is leaving the practice and the letter stated for her to call to get another physician.

## 2021-06-27 NOTE — Progress Notes (Signed)
Correction of optivol reading.  12/5 thoracic impedance at baseline normal.

## 2021-06-27 NOTE — Telephone Encounter (Signed)
Spoke with patient.  Advised pt that she will be transitioned to one of the other 4 EP physicians after Dr Rayann Heman leaves the practice.  She reports the letter said she needed to call about getting another doctor.  I advised the transition will be automatically and nothing she needs to do at this time.  She is doing well and denies any fluid symptoms at this time.  Encouraged to call if she experiences any symptoms and next ICM Remote transmission is 07/15/2021.

## 2021-07-11 DIAGNOSIS — L400 Psoriasis vulgaris: Secondary | ICD-10-CM | POA: Diagnosis not present

## 2021-07-11 DIAGNOSIS — L661 Lichen planopilaris: Secondary | ICD-10-CM | POA: Diagnosis not present

## 2021-07-11 DIAGNOSIS — L817 Pigmented purpuric dermatosis: Secondary | ICD-10-CM | POA: Diagnosis not present

## 2021-07-12 ENCOUNTER — Ambulatory Visit: Payer: Medicare HMO | Admitting: Physician Assistant

## 2021-07-15 ENCOUNTER — Ambulatory Visit (INDEPENDENT_AMBULATORY_CARE_PROVIDER_SITE_OTHER): Payer: Medicare HMO

## 2021-07-15 DIAGNOSIS — Z95810 Presence of automatic (implantable) cardiac defibrillator: Secondary | ICD-10-CM | POA: Diagnosis not present

## 2021-07-15 DIAGNOSIS — I5032 Chronic diastolic (congestive) heart failure: Secondary | ICD-10-CM | POA: Diagnosis not present

## 2021-07-17 NOTE — Progress Notes (Signed)
EPIC Encounter for ICM Monitoring  Patient Name: Marie Ramos is a 66 y.o. female Date: 07/17/2021 Primary Care Physican: Lawerance Cruel, MD Primary Cardiologist: Allred Electrophysiologist: Allred 07/17/2021 Weight: 264 lbs   Time in AT/AF  0.0 hr/day (0.0%) (taking Eliquis)         Spoke with patient and heart failure questions reviewed.  Pt asymptomatic for fluid accumulation.  Reports feeling well at this time and voices no complaints.           Optivol thoracic impedance suggesting normal fluid levels.   Prescribed: Furosemide 20 mg 1 tablet daily   Labs: 09/28/2020 Creatinine 1.14, BUN 28, Potassium 4.6, Sodium 143 A complete set of results can be found in Results Review.   Recommendations:   No changes and encouraged to call if experiencing any fluid symptoms.   Follow-up plan: ICM clinic phone appointment on 08/16/2021.  91 day device clinic remote transmission 08/15/2021.      EP/Cardiology Office Visits:  Recall 11/04/2021 with Tommye Standard, PA.    Copy of ICM check sent to Dr. Rayann Heman.    3 month ICM trend: 07/15/2021.    12-14 Month ICM trend:     [name], RN 07/17/2021 3:52 PM

## 2021-08-07 ENCOUNTER — Encounter: Payer: Self-pay | Admitting: Physician Assistant

## 2021-08-07 ENCOUNTER — Other Ambulatory Visit: Payer: Self-pay

## 2021-08-07 ENCOUNTER — Ambulatory Visit: Payer: Medicare HMO | Admitting: Physician Assistant

## 2021-08-07 DIAGNOSIS — F411 Generalized anxiety disorder: Secondary | ICD-10-CM | POA: Diagnosis not present

## 2021-08-07 DIAGNOSIS — F99 Mental disorder, not otherwise specified: Secondary | ICD-10-CM

## 2021-08-07 DIAGNOSIS — F5105 Insomnia due to other mental disorder: Secondary | ICD-10-CM | POA: Diagnosis not present

## 2021-08-07 DIAGNOSIS — F319 Bipolar disorder, unspecified: Secondary | ICD-10-CM

## 2021-08-07 NOTE — Progress Notes (Signed)
[facility]  Patient ID: Marie Ramos,  MRN: [id number]  PCP: Lawerance Cruel, MD  Date of Evaluation: 08/07/2021 Time spent:20 minutes  Chief Complaint:  Chief Complaint   Anxiety; Depression; Follow-up      HISTORY/CURRENT STATUS: For routine med check.  Doing well for the most part.  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  States she would like to be working some but also enjoys spending time with her family and especially her granddaughter.  She is sleeping well most of the time.  She watches TV a lot and reads in her free time.  Since increasing the Effexor a few months ago her mood has been much better.  She is able to enjoy things and is not "down" all the time.  Energy and motivation are fair to good depending on the day.  She is not isolating.  Does not cry easily.  ADLs are normal.  Personal hygiene is normal.  No suicidal or homicidal thoughts.  She still has anxiety but is usually only taking the Xanax in the evening.  Often times she cuts that in half, and is taking 0.125 mg only.  It works fine.  Patient denies increased energy with decreased need for sleep, no increased talkativeness, no racing thoughts, no impulsivity or risky behaviors, no increased spending, no increased libido, no grandiosity, no increased irritability or anger, and no hallucinations.  Denies dizziness, syncope, seizures, numbness, tingling, tremor, tics, unsteady gait, slurred speech, confusion. Denies muscle or joint pain, stiffness, or dystonia.  Individual Medical History/ Review of Systems: Changes? :No   she will be assigned a new cardiologist at some point in the near future as her current doctor is taking on a new position.   Past medications for mental health diagnoses include: Lithium, Trileptal, Effexor XR,  Allergies: Sulfa antibiotics, Albuterol, Other, Sulfamethoxazole-trimethoprim, Vicodin hp [hydrocodone-acetaminophen],  Naproxen, and Vicodin [hydrocodone-acetaminophen]  Current Medications:  Current Outpatient Medications:    Acetylcysteine (NAC) 600 MG CAPS, Take 1 capsule (600 mg total) by mouth daily., Disp: 30 capsule, Rfl: 11   ALPRAZolam (XANAX) 0.25 MG tablet, Take 0.5-1 tablets (0.125-0.25 mg total) by mouth 3 (three) times daily as needed for anxiety., Disp: 90 tablet, Rfl: 5   amLODipine (NORVASC) 5 MG tablet, Take 1 tablet (5 mg total) by mouth daily with breakfast., Disp: 30 tablet, Rfl: 0   apixaban (ELIQUIS) 5 MG TABS tablet, Take 1 tablet (5 mg total) by mouth 2 (two) times daily., Disp: 60 tablet, Rfl: 5   Ascorbic Acid (VITAMIN C PO), Take 1 capsule by mouth daily., Disp: , Rfl:    atorvastatin (LIPITOR) 20 MG tablet, Take 1 tablet (20 mg total) by mouth daily at 6 PM., Disp: 30 tablet, Rfl: 0   carvedilol (COREG) 12.5 MG tablet, TAKE ONE TABLET BY MOUTH TWICE DAILY WITH BREAKFAST AND DINNER, Disp: 180 tablet, Rfl: 2   famotidine (PEPCID) 20 MG tablet, Take 20 mg by mouth at bedtime., Disp: , Rfl:    fexofenadine (ALLEGRA) 180 MG tablet, Take 180 mg by mouth daily., Disp: , Rfl:    fluocinonide (LIDEX) 0.05 % external solution, as needed., Disp: , Rfl:    fluticasone (FLONASE) 50 MCG/ACT nasal spray, Place 1 spray into both nostrils daily. Use as directed, Disp: , Rfl:    furosemide (LASIX) 20 MG tablet, TAKE ONE TABLET BY MOUTH ONE TIME DAILY , Disp: 90 tablet, Rfl: 2   Ginkgo Biloba 40  MG TABS, Take 1 tablet (40 mg total) by mouth 2 (two) times daily., Disp: 180 tablet, Rfl: 11   losartan (COZAAR) 100 MG tablet, Take 1 tablet (100 mg total) by mouth daily., Disp: 90 tablet, Rfl: 3   Multiple Vitamin (MULTIVITAMIN WITH MINERALS) TABS tablet, Take 1 tablet by mouth daily., Disp: , Rfl:    Multiple Vitamins-Minerals (OCUVITE PO), Take 1 capsule by mouth daily., Disp: , Rfl:    Oxcarbazepine (TRILEPTAL) 300 MG tablet, Take 1.5 tablets (450 mg total) by mouth 2 (two) times daily., Disp: 90 tablet,  Rfl: 5   triamcinolone ointment (KENALOG) 0.5 %, Apply 1 application topically as needed., Disp: , Rfl: 0   venlafaxine XR (EFFEXOR-XR) 150 MG 24 hr capsule, Take 2 capsules (300 mg total) by mouth at bedtime., Disp: 180 capsule, Rfl: 1   Risankizumab-rzaa (SKYRIZI, 150 MG DOSE, [place]), Inject into the skin every 3 (three) months. (Patient not taking: Reported on 05/08/2021), Disp: , Rfl:  Medication Side Effects: none  Family Medical/ Social History: Changes? No  MENTAL HEALTH EXAM:  There were no vitals taken for this visit.There is no height or weight on file to calculate BMI.  General Appearance: Casual, Neat, Well Groomed and Obese  Eye Contact:  Good  Speech:  Clear and Coherent and Normal Rate  Volume:  Normal  Mood:  Euthymic  Affect:  Congruent       Thought Process:  Goal Directed and Descriptions of Associations: Circumstantial  Orientation:  Full (Time, Place, and Person)  Thought Content: Logical   Suicidal Thoughts:  No  Homicidal Thoughts:  No  Memory:  Immediate;   Fair Recent;   Fair Remote;   Good  Judgement:  Good  Insight:  Good  Psychomotor Activity:  Normal  Concentration:  Concentration: Good and Attention Span: Good  Recall:  Good  Fund of Knowledge: Good  Language: Good  Assets:  Desire for Improvement  ADL's:  Intact  Cognition: WNL  Prognosis:  Good    DIAGNOSES:    ICD-10-CM   1. Bipolar I disorder ([place])  F31.9     2. Generalized anxiety disorder  F41.1     3. Insomnia due to other mental disorder  F51.05    F99         Receiving Psychotherapy: No    RECOMMENDATIONS:  PDMP reviewed. Last Xanax 06/07/2021 I provided 20 minutes of face to face time during this encounter, including time spent before and after the visit in records review, medical decision making, counseling pertinent to today's visit, and charting.  She is doing well on the current medications so no changes will be made. Continue Xanax 0.25 mg, 1 p.o. nightly as needed  sleep or anxiety. Continue Trileptal 300 mg, 1.5 pills p.o. twice daily. Continue Effexor XR 150 mg, 2 p.o. daily. Continue NAC, multivitamin, B complex, fish oil, ginkgo biloba. Return in 3 months.  Avoid drugs that are known to cause prolonged QT interval  Donnal Moat, PA-C

## 2021-08-15 ENCOUNTER — Ambulatory Visit (INDEPENDENT_AMBULATORY_CARE_PROVIDER_SITE_OTHER): Payer: Medicare HMO

## 2021-08-15 DIAGNOSIS — I428 Other cardiomyopathies: Secondary | ICD-10-CM

## 2021-08-16 ENCOUNTER — Ambulatory Visit: Payer: Medicare HMO

## 2021-08-16 LAB — CUP PACEART REMOTE DEVICE CHECK
Battery Remaining Longevity: 35 mo
Battery Voltage: 2.96 V
Brady Statistic AP VP Percent: 0 %
Brady Statistic AP VS Percent: 1.38 %
Brady Statistic AS VP Percent: 0.04 %
Brady Statistic AS VS Percent: 98.58 %
Brady Statistic RA Percent Paced: 1.38 %
Brady Statistic RV Percent Paced: 0.04 %
Date Time Interrogation Session: 20230210110516
HighPow Impedance: 86 Ohm
Implantable Lead Implant Date: 20170227
Implantable Lead Implant Date: 20170227
Implantable Lead Location: 753859
Implantable Lead Location: 753860
Implantable Lead Model: 5076
Implantable Pulse Generator Implant Date: 20170227
Lead Channel Impedance Value: 399 Ohm
Lead Channel Impedance Value: 418 Ohm
Lead Channel Impedance Value: 513 Ohm
Lead Channel Pacing Threshold Amplitude: 0.5 V
Lead Channel Pacing Threshold Amplitude: 0.5 V
Lead Channel Pacing Threshold Pulse Width: 0.4 ms
Lead Channel Pacing Threshold Pulse Width: 0.4 ms
Lead Channel Sensing Intrinsic Amplitude: 2.75 mV
Lead Channel Sensing Intrinsic Amplitude: 2.75 mV
Lead Channel Sensing Intrinsic Amplitude: 25.625 mV
Lead Channel Sensing Intrinsic Amplitude: 25.625 mV
Lead Channel Setting Pacing Amplitude: 2 V
Lead Channel Setting Pacing Amplitude: 2.5 V
Lead Channel Setting Pacing Pulse Width: 0.4 ms
Lead Channel Setting Sensing Sensitivity: 0.3 mV

## 2021-08-16 NOTE — Progress Notes (Signed)
No ICM remote transmission received for 08/16/2021 and next ICM transmission scheduled for 09/02/2021.

## 2021-08-20 NOTE — Progress Notes (Signed)
Remote ICD transmission.   

## 2021-09-02 ENCOUNTER — Ambulatory Visit (INDEPENDENT_AMBULATORY_CARE_PROVIDER_SITE_OTHER): Payer: Medicare HMO

## 2021-09-02 DIAGNOSIS — Z95810 Presence of automatic (implantable) cardiac defibrillator: Secondary | ICD-10-CM

## 2021-09-02 DIAGNOSIS — I5032 Chronic diastolic (congestive) heart failure: Secondary | ICD-10-CM

## 2021-09-04 NOTE — Progress Notes (Signed)
EPIC Encounter for ICM Monitoring  Patient Name: Marie Ramos is a 66 y.o. female Date: 09/04/2021 Primary Care Physican: Lawerance Cruel, MD Primary Cardiologist: Allred Electrophysiologist: Allred 07/17/2021 Weight: 264 lbs   Time in AT/AF  0.0 hr/day (0.0%) (taking Eliquis)         Spoke with patient and heart failure questions reviewed.  Pt asymptomatic for fluid accumulation.  Reports feeling well at this time and voices no complaints.           Optivol thoracic impedance suggesting normal fluid levels.   Prescribed: Furosemide 20 mg 1 tablet daily   Labs: 09/28/2020 Creatinine 1.14, BUN 28, Potassium 4.6, Sodium 143 A complete set of results can be found in Results Review.   Recommendations:   No changes and encouraged to call if experiencing any fluid symptoms.   Follow-up plan: ICM clinic phone appointment on 10/07/2021.  91 day device clinic remote transmission 10/15/2021.      EP/Cardiology Office Visits:  Recall 11/04/2021 with Tommye Standard, PA.    Copy of ICM check sent to Dr. Rayann Heman.   3 month ICM trend: 09/02/2021.    12-14 Month ICM trend:     [name], RN 09/04/2021 2:46 PM

## 2021-10-01 ENCOUNTER — Encounter: Payer: Self-pay | Admitting: Physician Assistant

## 2021-10-01 ENCOUNTER — Other Ambulatory Visit: Payer: Self-pay

## 2021-10-01 ENCOUNTER — Ambulatory Visit (INDEPENDENT_AMBULATORY_CARE_PROVIDER_SITE_OTHER): Payer: Medicare HMO | Admitting: Physician Assistant

## 2021-10-01 DIAGNOSIS — F99 Mental disorder, not otherwise specified: Secondary | ICD-10-CM

## 2021-10-01 DIAGNOSIS — R9431 Abnormal electrocardiogram [ECG] [EKG]: Secondary | ICD-10-CM | POA: Diagnosis not present

## 2021-10-01 DIAGNOSIS — F3112 Bipolar disorder, current episode manic without psychotic features, moderate: Secondary | ICD-10-CM | POA: Diagnosis not present

## 2021-10-01 DIAGNOSIS — F411 Generalized anxiety disorder: Secondary | ICD-10-CM | POA: Diagnosis not present

## 2021-10-01 DIAGNOSIS — F5105 Insomnia due to other mental disorder: Secondary | ICD-10-CM | POA: Diagnosis not present

## 2021-10-01 MED ORDER — RISPERIDONE 1 MG PO TABS: 1.0000 mg | ORAL_TABLET | Freq: Every day | ORAL | 1 refills | Status: DC

## 2021-10-01 MED ORDER — QUETIAPINE FUMARATE ER 150 MG PO TB24: 150.0000 mg | ORAL_TABLET | Freq: Every day | ORAL | 1 refills | Status: DC

## 2021-10-01 NOTE — Progress Notes (Signed)
[facility] ? ?Patient ID: Marie Ramos,  ?MRN: [id number] ? ?PCP: Lawerance Cruel, MD ? ?Date of Evaluation: 10/01/2021 ?Time spent:40 minutes ? ?Chief Complaint:  ?Chief Complaint   ?Manic Behavior ?  ? ? ?HISTORY/CURRENT STATUS: ?For routine med check. Husband [name] is with is with her.  ? ?He reports she's been manic for about a week.  It has been many years since she has been manic.  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  He took debit card away. She had bought bras on a whim before he realized what was going on.  In the past she had gotten in trouble financially because of increased spending.  Denies grandiosity.  Appetite is normal.  No paranoia. hallucinations. ? ?She denies any symptoms of depression.  He is very worried about that though, once the manic episode is over, that she will crash and be more depressed than she has been. Patient denies loss of interest in usual activities and is able to enjoy things.  Denies decreased energy or motivation. No extreme sadness, tearfulness, or feelings of hopelessness.  Denies any changes in concentration, making decisions or remembering things.  Denies suicidal or homicidal thoughts. ? ?She is taking the Xanax usually at night only.  Often times only at 0.125 mg.  It helps her relax.  ? ?Review of Systems  ?Constitutional: Negative.   ?HENT: Negative.    ?Eyes: Negative.   ?Respiratory: Negative.    ?Cardiovascular: Negative.   ?Gastrointestinal: Negative.   ?Genitourinary: Negative.   ?Musculoskeletal: Negative.   ?Skin: Negative.   ?Neurological: Negative.   ?Endo/Heme/Allergies: Negative.   ?Psychiatric/Behavioral:    ?     See HPI  ? ?Individual Medical History/ Review of Systems: Changes? :No   she will be assigned a new cardiologist at some point  in the near future as her current doctor is taking on a new position. ?  ?Past medications for mental health diagnoses include: ?Lithium caused diarrhea and increased thirst, Trileptal, Effexor XR, Seroquel worked well, Abilify unknown what happened ? ?Allergies: Sulfa antibiotics, Albuterol, Other, Sulfamethoxazole-trimethoprim, Vicodin hp [hydrocodone-acetaminophen], Naproxen, and Vicodin [hydrocodone-acetaminophen] ? ?Current Medications:  ?Current Outpatient Medications:  ?  Acetylcysteine (NAC) 600 MG CAPS, Take 1 capsule (600 mg total) by mouth daily., Disp: 30 capsule, Rfl: 11 ?  ALPRAZolam (XANAX) 0.25 MG tablet, Take 0.5-1 tablets (0.125-0.25 mg total) by mouth 3 (three) times daily as needed for anxiety., Disp: 90 tablet, Rfl: 5 ?  amLODipine (NORVASC) 5 MG tablet, Take 1 tablet (5 mg total) by mouth daily with breakfast., Disp: 30 tablet, Rfl: 0 ?  apixaban (ELIQUIS) 5 MG TABS tablet, Take 1 tablet (5 mg total) by mouth 2 (two) times daily., Disp: 60 tablet, Rfl: 5 ?  Ascorbic Acid (VITAMIN C PO), Take 1 capsule by mouth daily., Disp: , Rfl:  ?  atorvastatin (LIPITOR) 20 MG tablet, Take 1 tablet (20 mg total) by mouth daily at 6 PM., Disp: 30 tablet, Rfl: 0 ?  carvedilol (COREG) 12.5 MG tablet, TAKE ONE TABLET BY MOUTH TWICE DAILY WITH BREAKFAST AND DINNER, Disp: 180 tablet, Rfl: 2 ?  famotidine (PEPCID) 20 MG tablet, Take 20 mg by mouth at bedtime., Disp: , Rfl:  ?  fexofenadine (ALLEGRA) 180 MG tablet, Take 180 mg by  mouth daily., Disp: , Rfl:  ?  fluocinonide (LIDEX) 0.05 % external solution, as needed., Disp: , Rfl:  ?  fluticasone (FLONASE) 50 MCG/ACT nasal spray, Place 1 spray into both nostrils daily. Use as directed, Disp: , Rfl:  ?  furosemide (LASIX) 20 MG tablet, TAKE ONE TABLET BY MOUTH ONE TIME DAILY , Disp: 90 tablet, Rfl: 2 ?  Ginkgo Biloba 40 MG TABS, Take 1 tablet (40 mg total) by mouth 2 (two) times daily., Disp: 180 tablet, Rfl: 11 ?  losartan (COZAAR) 100 MG tablet, Take 1 tablet (100  mg total) by mouth daily., Disp: 90 tablet, Rfl: 3 ?  Multiple Vitamin (MULTIVITAMIN WITH MINERALS) TABS tablet, Take 1 tablet by mouth daily., Disp: , Rfl:  ?  Multiple Vitamins-Minerals (OCUVITE PO), Take 1 capsule by mouth daily., Disp: , Rfl:  ?  Oxcarbazepine (TRILEPTAL) 300 MG tablet, Take 1.5 tablets (450 mg total) by mouth 2 (two) times daily., Disp: 90 tablet, Rfl: 5 ?  risperiDONE (RISPERDAL) 1 MG tablet, Take 1 tablet (1 mg total) by mouth at bedtime., Disp: 30 tablet, Rfl: 1 ?  triamcinolone ointment (KENALOG) 0.5 %, Apply 1 application topically as needed., Disp: , Rfl: 0 ?  venlafaxine XR (EFFEXOR-XR) 150 MG 24 hr capsule, Take 2 capsules (300 mg total) by mouth at bedtime., Disp: 180 capsule, Rfl: 1 ?  Risankizumab-rzaa (SKYRIZI, 150 MG DOSE, [place]), Inject into the skin every 3 (three) months. (Patient not taking: Reported on 05/08/2021), Disp: , Rfl:  ?Medication Side Effects: none ? ?Family Medical/ Social History: Changes? No ? ?MENTAL HEALTH EXAM: ? ?There were no vitals taken for this visit.There is no height or weight on file to calculate BMI.  ?General Appearance: Casual, Neat, Well Groomed and Obese  ?Eye Contact:  Good  ?Speech:  Clear and Coherent, Normal Rate, Pressured, and Talkative  ?Volume:  Normal  ?Mood:  Euphoric  ?Affect:  Congruent       ?Thought Process:  Goal Directed and Descriptions of Associations: Circumstantial  ?Orientation:  Full (Time, Place, and Person)  ?Thought Content: Logical   ?Suicidal Thoughts:  No  ?Homicidal Thoughts:  No  ?Memory:  Immediate;   Fair ?Recent;   Fair ?Remote;   Good  ?Judgement:  Good  ?Insight:  Good  ?Psychomotor Activity:  Normal  ?Concentration:  Concentration: Good and Attention Span: Good  ?Recall:  Good  ?Fund of Knowledge: Good  ?Language: Good  ?Assets:  Desire for Improvement ?Financial Resources/Insurance ?Housing ?Social Support ?Talents/Skills ?Transportation  ?ADL's:  Intact  ?Cognition: WNL  ?Prognosis:  Good  ? ? ?DIAGNOSES:  ?   ICD-10-CM   ?1. Bipolar 1 disorder with moderate mania (HCC)  F31.12   ?  ?2. Generalized anxiety disorder  F41.1   ?  ?3. Insomnia due to other mental disorder  F51.05   ? F99   ?  ?4. QT prolongation  R94.31   ?  ? ? ? ? ?Receiving Psychotherapy: No  ? ? ?RECOMMENDATIONS:  ?PDMP reviewed. Last Xanax 09/23/2021. ?I provided 40 minutes of face to face time during this encounter, including time spent before and after the visit in records review, medical decision making, counseling pertinent to today's visit, and charting.  ?We discussed different options for treatment.  She needs to be on an antipsychotic.  Lithium would be another obvious choice but she did not do well with that in the past, had GI upset and weight gain.  She has taken Seroquel in the past which  worked well and she had no problems with and Abilify with unknown results or side effects.  We discussed Seroquel and I had originally recommended that, since the prescription in, but before the patient left the office I reviewed her chart that meds that can cause prolonged QT interval are contraindicated.  The prescription for the Seroquel was canceled. Risperdal was then recommended, benefits, risks and side effects were discussed and she and her husband accept.  They are aware of the metabolic effects, she has known diabetes and hyperlipidemia and already treated and monitored for that regularly.  The 3 of [name] decide that the benefit of the medication is worth the possible side effects at this time.  I will start Risperdal at a fairly high dose and reevaluate in a few weeks.  Of course if the mania worsens or is not any better at all within 5 days or so then they should call the office.  In the meantime I recommend she not drive and did not work until this episode is under control. ? ?Continue Xanax 0.25 mg, 1 p.o. nightly as needed sleep or anxiety. ?Continue Trileptal 300 mg, 1.5 pills p.o. twice daily. ?Start Risperdal 1 mg, 1 p.o. nightly. ?Continue  Effexor XR 150 mg, 2 p.o. daily. ?Continue NAC, multivitamin, B complex, fish oil, ginkgo biloba. ?Return in 2 weeks. ? ?Avoid drugs that are known to cause prolonged QT interval ? ?Donnal Moat, PA-C  ?

## 2021-10-02 ENCOUNTER — Telehealth: Payer: Self-pay | Admitting: Physician Assistant

## 2021-10-02 NOTE — Telephone Encounter (Signed)
Patient notified

## 2021-10-02 NOTE — Telephone Encounter (Signed)
Please see message from patient. I will call once I get your response.  ?

## 2021-10-02 NOTE — Telephone Encounter (Signed)
Marie Ramos called this afternoon at 3:15 to report that she has an appt 4/6 with her original cardiologist Dr. Rayann Heman.  She wanted to make sure that the appt is ok or is it too far away?  It is her original Dr. And she wanted to see him and grabbed the appt since it was available.  But again wants to make sure that you ok with her waiting until then.  Please call. ?

## 2021-10-02 NOTE — Telephone Encounter (Addendum)
I called Marie Ramos, gave her the information on risk of QT prolongation with Risperdal.  She and I discussed the risk benefit ratio and at this time we agree that the benefit from the Risperdal outweighs the cardiac risk.  However, she needs very close follow-up with cardiology.  I have told her to contact the cardiology office this afternoon to set up an appointment, being sure to let the receptionist know that she needs to be seen as soon as possible.  If she has any symptoms such as dizziness, presyncope, syncope, chest pain, increased shortness of breath or any new problems, call 911 or go to the ER.  She verbalizes understanding. ? ? ?----- Message from Leeroy Bock, [place] sent at 10/02/2021  9:50 AM EDT ----- ?Regarding: RE: Med change ?Ziprasidone has a higher risk of QTc prolongation than risperidone, olanzapine and lurasidone's risk are more comparable with risperidone but not necessarily safer - should be ok to continue with risperidone with close EKG follow up and advisement of increased risk of QTc prolongation. ? ?Thanks, ?Megan ?----- Message ----- ?From: Addison Lank, PA-C ?Sent: 10/02/2021   9:46 AM EDT ?To: Leeroy Bock, RPH-CPP, # ?Subject: RE: Med change                                ? ?Thank you both for your reply.  Would Zyprexa (olanzapine) Geodon (ziprasidone) be any safer?  What about Latuda (lurasidone) although that would not be my first choice because not FDA approved for mixed mania/depression but is certainly used off label often times.  The newer ones like Vraylar Caplyta Rexulti Lybalvi are all branded still and would be too expensive. ?She refuses lithium, had a bad experience with that in the past. ?If Zyprexa, Geodon, or Latuda have all the same risk, then I will continue the Risperdal but call her letting her know the increased risk and to contact your office for follow-up ASAP. ?Does the sound like a good plan? ?Thanks again, ?Marie Ramos ? ?----- Message ----- ?From: Leeroy Bock, RPH-CPP ?Sent: 10/02/2021   9:23 AM EDT ?To: Addison Lank, PA-C, # ?Subject: RE: Med change                                ? ?Risperidone does have a lower risk of QTc prolongation than other antipsychotics but does still carry the risk - agree with close EKG monitoring after med initiation. ?----- Message ----- ?From: Shirley Friar, PA-C ?Sent: 10/01/2021  10:27 PM EDT ?To: Addison Lank, PA-C, Leeroy Bock, RPH-CPP, # ?Subject: RE: Med change                                ? ?Qt prolongation has been described with both prescribed and overdoses of Risperdal with warnings of "Conditional risk of Torsades" and "Drug to avoid for cLQTS patients, if possible, but does appear to be one of the more safe options.  ? ?I will also include are clinical pharmacists.  ? ?If no other options are available, She will need VERY close follow up to monitor her QT by EKG and it should be made sure she understands the risk given history of VF arrest.  ? ?----- Message ----- ?From: Addison Lank, PA-C ?Sent: 10/01/2021   8:54  PM EDT ?To: Baldwin [place], PA-C ?Subject: Med change                                    ? ?Hi there, ?I just wanted to make y'all aware of a med change. Today I started her on Risperdal for an acute manic episode. She has taken Seroquel in the past and I sent in an Rx for it. But thankfully I caught my mistake before she left the office, explained the reason Seroquel is contraindicated, and the prescription for Seroquel was canceled.  I want to confirm that the Risperdal is okay.  Thanks for your help! ?Marie Ramos ? ? ? ? ? ?

## 2021-10-02 NOTE — Telephone Encounter (Signed)
Opened by mistake.  See other phone note. ?

## 2021-10-02 NOTE — Telephone Encounter (Signed)
Yes it should be fine.

## 2021-10-04 ENCOUNTER — Telehealth: Payer: Self-pay

## 2021-10-04 ENCOUNTER — Telehealth: Payer: Self-pay | Admitting: Physician Assistant

## 2021-10-04 NOTE — Telephone Encounter (Signed)
See message from patient. I called her back and told her that was a marital issue. She laughed and said that was fine but she wanted you to know. She had rapid speech and was bouncing back and forth about the next appt with you and her cardiology appt.  ?

## 2021-10-04 NOTE — Telephone Encounter (Signed)
Marie Ramos called today at 3:45 because she is seeking advise on how to convince he husband to take her to a celebration.  He doesn't want to take her because he is afraid she might enter a manic state and he doesn't want to be a part of that in a public setting. ?

## 2021-10-04 NOTE — Telephone Encounter (Signed)
Yes, the thing with her husband is not a medication management problem.  Risperdal was started a few days ago, will need more time to take effect. ?

## 2021-10-04 NOTE — Telephone Encounter (Signed)
Returned call to patient per voice mail request.  She was asking about medtronic monitor and advised it is by her bedside since she moved into new apartment.  She has appointment with Dr Rayann Heman and will discuss new psychiatric medication.  Advised will check fluid levels on 4/3 and will call with results.   ?

## 2021-10-10 ENCOUNTER — Telehealth: Payer: Self-pay

## 2021-10-10 ENCOUNTER — Ambulatory Visit (INDEPENDENT_AMBULATORY_CARE_PROVIDER_SITE_OTHER): Payer: Medicare HMO | Admitting: Internal Medicine

## 2021-10-10 ENCOUNTER — Encounter: Payer: Self-pay | Admitting: Internal Medicine

## 2021-10-10 VITALS — BP 144/80 | HR 88 | Ht 62.0 in | Wt 250.0 lb

## 2021-10-10 DIAGNOSIS — R9431 Abnormal electrocardiogram [ECG] [EKG]: Secondary | ICD-10-CM

## 2021-10-10 DIAGNOSIS — D6869 Other thrombophilia: Secondary | ICD-10-CM

## 2021-10-10 DIAGNOSIS — I469 Cardiac arrest, cause unspecified: Secondary | ICD-10-CM | POA: Diagnosis not present

## 2021-10-10 DIAGNOSIS — I480 Paroxysmal atrial fibrillation: Secondary | ICD-10-CM

## 2021-10-10 NOTE — Patient Instructions (Addendum)
Medication Instructions:  ?Your physician recommends that you continue on your current medications as directed. Please refer to the Current Medication list given to you today. ?*If you need a refill on your cardiac medications before your next appointment, please call your pharmacy* ? ?Lab Work: ?CBC, BMP, Mag ?If you have labs (blood work) drawn today and your tests are completely normal, you will receive your results only by: ?MyChart Message (if you have MyChart) OR ?A paper copy in the mail ?If you have any lab test that is abnormal or we need to change your treatment, we will call you to review the results. ? ?Testing/Procedures: ?None. ? ?Follow-Up: ?At [facility], you and your health needs are our priority.  As part of our continuing mission to provide you with exceptional heart care, we have created designated Provider Care Teams.  These Care Teams include your primary Cardiologist (physician) and Advanced Practice Providers (APPs -  Physician Assistants and Nurse Practitioners) who all work together to provide you with the care you need, when you need it. ? ?Your physician wants you to follow-up in: 9 months with one of the following Advanced Practice Providers on your designated Care Team:   ? ?Tommye Standard, PA-C ? ?  You will receive a reminder letter in the mail two months in advance. If you don't receive a letter, please call our office to schedule the follow-up appointment. ? ?Remote monitoring is used to monitor you ICD from home. This monitoring reduces the number of office visits required to check your device to one time per year. It allows [name] to keep an eye on the functioning of your device to ensure it is working properly. You are scheduled for a device check from home on 11/14/21. You may send your transmission at any time that day. If you have a wireless device, the transmission will be sent automatically. After your physician reviews your transmission, you will receive a postcard with your  next transmission date. ? ?We recommend signing up for the patient portal called "MyChart".  Sign up information is provided on this After Visit Summary.  MyChart is used to connect with patients for Virtual Visits (Telemedicine).  Patients are able to view lab/test results, encounter notes, upcoming appointments, etc.  Non-urgent messages can be sent to your provider as well.   ?To learn more about what you can do with MyChart, go to [email or web address].   ? ?Any Other Special Instructions Will Be Listed Below (If Applicable). ? ? ? ? ?  ? ? ?

## 2021-10-10 NOTE — Telephone Encounter (Signed)
I called the patient about missed ICM transmission but no answer/no voicemail. ?

## 2021-10-10 NOTE — Progress Notes (Signed)
? ?PCP: Lawerance Cruel, MD ?  ?Primary EP: Dr Rayann Heman ? ?Marie Ramos is a 66 y.o. female who presents today for routine electrophysiology followup.  Since last being seen in our clinic, the patient reports doing very well.  Today, she denies symptoms of palpitations, chest pain, shortness of breath,  lower extremity edema, dizziness, presyncope, syncope, or ICD shocks.  The patient is otherwise without complaint today.  ? ?Past Medical History:  ?Diagnosis Date  ? Abnormality of gait   ? Acute bilat watershed infarction [facility])   ? Acute lower UTI   ? Acute metabolic encephalopathy 08/08/9796  ? Acute respiratory failure ([place])   ? Acute systolic congestive heart failure ([place])   ? AKI (acute kidney injury) ([place])   ? Anoxic brain damage (HCC)   ? Bipolar 1 disorder ([place])   ? Bipolar affective disorder in remission [facility])   ? Cardiac arrest ([place]) 08/24/2015  ? Cerebral infarction due to embolism of cerebral artery ([place])   ? Cerebrovascular accident (CVA) due to embolism of cerebral artery ([place]) 12/05/2015  ? Chronically dry eyes   ? Closed fracture of left distal radius 12/01/2018  ? DVT (deep venous thrombosis) ([place]) 12/05/2015  ? Dyspnea   ? r/t seasonal allergies  ? Hypertrophic obstructive cardiomyopathy ([place]) 08/29/2015  ? Leukocytosis   ? Macular degeneration   ? Pneumonia due to COVID-19 virus 08/16/2019  ? Pulmonary vascular congestion 08/16/2019  ? QT prolongation 08/29/2015  ? Stroke [facility])   ? Systolic dysfunction with acute on chronic heart failure ([place])   ? TBI (traumatic brain injury) [facility]) 2011  ? Inpatient rehab [facility]  ? Thrombocytosis   ? Ventricular fibrillation ([place]) 09/03/15  ? MDT ICD Dr. Rayann Heman  ? ?Past Surgical History:  ?Procedure Laterality Date  ? ABDOMINAL HYSTERECTOMY    ? CARDIAC CATHETERIZATION N/A 08/24/2015  ? Procedure: Left Heart Cath and Coronary Angiography;  Surgeon: Sherren Mocha, MD;  Location: [facility];  Service: Cardiovascular;  Laterality: N/A;  ? EP IMPLANTABLE DEVICE N/A  09/03/2015  ? MDT dual chamber ICD  ? OPEN REDUCTION INTERNAL FIXATION (ORIF) DISTAL RADIAL FRACTURE Left 12/01/2018  ? Procedure: OPEN REDUCTION INTERNAL FIXATION (ORIF) LEFT DISTAL RADIAL FRACTURE;  Surgeon: Hiram Gash, MD;  Location: [facility] ORS;  Service: Orthopedics;  Laterality: Left;  ? SPLENECTOMY, TOTAL  2011  ? TONSILLECTOMY    ? ? ?ROS- all systems are reviewed and negative except as per HPI above ? ?Current Outpatient Medications  ?Medication Sig Dispense Refill  ? Acetylcysteine (NAC) 600 MG CAPS Take 1 capsule (600 mg total) by mouth daily. 30 capsule 11  ? ALPRAZolam (XANAX) 0.25 MG tablet Take 0.5-1 tablets (0.125-0.25 mg total) by mouth 3 (three) times daily as needed for anxiety. 90 tablet 5  ? amLODipine (NORVASC) 5 MG tablet Take 1 tablet (5 mg total) by mouth daily with breakfast. 30 tablet 0  ? apixaban (ELIQUIS) 5 MG TABS tablet Take 1 tablet (5 mg total) by mouth 2 (two) times daily. 60 tablet 5  ? Ascorbic Acid (VITAMIN C PO) Take 1 capsule by mouth daily.    ? atorvastatin (LIPITOR) 20 MG tablet Take 1 tablet (20 mg total) by mouth daily at 6 PM. 30 tablet 0  ? carvedilol (COREG) 12.5 MG tablet TAKE ONE TABLET BY MOUTH TWICE DAILY WITH BREAKFAST AND DINNER 180 tablet 2  ? famotidine (PEPCID) 20 MG tablet Take 20 mg by mouth at bedtime.    ? fexofenadine (ALLEGRA)  180 MG tablet Take 180 mg by mouth daily.    ? fluocinonide (LIDEX) 0.05 % external solution as needed.    ? fluticasone (FLONASE) 50 MCG/ACT nasal spray Place 1 spray into both nostrils daily. Use as directed    ? furosemide (LASIX) 20 MG tablet TAKE ONE TABLET BY MOUTH ONE TIME DAILY  90 tablet 2  ? Ginkgo Biloba 40 MG TABS Take 1 tablet (40 mg total) by mouth 2 (two) times daily. 180 tablet 11  ? losartan (COZAAR) 100 MG tablet Take 1 tablet (100 mg total) by mouth daily. 90 tablet 3  ? Multiple Vitamin (MULTIVITAMIN WITH MINERALS) TABS tablet Take 1 tablet by mouth daily.    ? Multiple Vitamins-Minerals (OCUVITE PO) Take 1 capsule  by mouth daily.    ? Oxcarbazepine (TRILEPTAL) 300 MG tablet Take 1.5 tablets (450 mg total) by mouth 2 (two) times daily. 90 tablet 5  ? Risankizumab-rzaa (SKYRIZI, 150 MG DOSE, [place]) Inject into the skin every 3 (three) months.    ? risperiDONE (RISPERDAL) 1 MG tablet Take 1 tablet (1 mg total) by mouth at bedtime. 30 tablet 1  ? triamcinolone ointment (KENALOG) 0.5 % Apply 1 application topically as needed.  0  ? venlafaxine XR (EFFEXOR-XR) 150 MG 24 hr capsule Take 2 capsules (300 mg total) by mouth at bedtime. 180 capsule 1  ? ?No current facility-administered medications for this visit.  ? ? ?Physical Exam: ?Vitals:  ? 10/10/21 1226  ?BP: (!) 144/80  ?Pulse: 88  ?SpO2: 92%  ?Weight: 250 lb (113.4 kg)  ?Height: '5\' 2"'$  (1.575 m)  ? ? ?GEN- The patient is well appearing, alert and oriented x 3 today.   ?Head- normocephalic, atraumatic ?Eyes-  Sclera clear, conjunctiva pink ?Ears- hearing intact ?Oropharynx- clear ?Lungs- Clear to ausculation bilaterally, normal work of breathing ?Chest- ICD pocket is well healed ?Heart- Regular rate and rhythm, no murmurs, rubs or gallops, PMI not laterally displaced ?GI- soft, NT, ND, + BS ?Extremities- no clubbing, cyanosis, or edema ? ?ICD interrogation- reviewed in detail today,  See PACEART report ? ?ekg tracing ordered today is personally reviewed and shows sinus rhythm. Qtc 470 msec ? ?Wt Readings from Last 3 Encounters:  ?10/10/21 250 lb (113.4 kg)  ?05/08/21 258 lb (117 kg)  ?03/27/21 262 lb 6.4 oz (119 kg)  ? ? ?Assessment and Plan: ? ?1.  Prior VF arrest with long QT syndrome ?Stable on an appropriate medical regimen ?Normal ICD function ?See [facility] report ?No changes today ?she is not device dependant today ?Bmet and mg today ? ?2. HTN ?Stable ?No change required today ? ?3. Bipolar disorder ?Caution with qt prolonging medicines (pt aware) ? ?4. Obesity ?Body mass index is 45.73 kg/m?. ?Lifestyle modification advised ? ?5. Subclinical AF ?Burden remains low  (<1%) ?Given prior stroke, she is on eliquis ?Cbc and bmet today ? ?Risks, benefits and potential toxicities for medications prescribed and/or refilled reviewed with patient today.  ? ?Return to see EP APP In 9 months ? ?Thompson Grayer MD, FACC ?10/10/2021 ?12:30 PM ? ?

## 2021-10-11 LAB — BASIC METABOLIC PANEL
BUN/Creatinine Ratio: 21 (ref 12–28)
BUN: 25 mg/dL (ref 8–27)
CO2: 25 mmol/L (ref 20–29)
Calcium: 9.9 mg/dL (ref 8.7–10.3)
Chloride: 103 mmol/L (ref 96–106)
Creatinine, Ser: 1.21 mg/dL — ABNORMAL HIGH (ref 0.57–1.00)
Glucose: 82 mg/dL (ref 70–99)
Potassium: 5.2 mmol/L (ref 3.5–5.2)
Sodium: 142 mmol/L (ref 134–144)
eGFR: 49 mL/min/{1.73_m2} — ABNORMAL LOW (ref >59)

## 2021-10-11 LAB — CBC
Hematocrit: 36.1 % (ref 34.0–46.6)
Hemoglobin: 11.8 g/dL (ref 11.1–15.9)
MCH: 31.2 pg (ref 26.6–33.0)
MCHC: 32.7 g/dL (ref 31.5–35.7)
MCV: 96 fL (ref 79–97)
Platelets: 501 10*3/uL — ABNORMAL HIGH (ref 150–450)
RBC: 3.78 x10E6/uL (ref 3.77–5.28)
RDW: 13 % (ref 11.7–15.4)
WBC: 11.7 10*3/uL — ABNORMAL HIGH (ref 3.4–10.8)

## 2021-10-11 LAB — MAGNESIUM: Magnesium: 2.5 mg/dL — ABNORMAL HIGH (ref 1.6–2.3)

## 2021-10-11 NOTE — Progress Notes (Signed)
No ICM remote transmission received for 10/07/2021 and next ICM transmission scheduled for 10/28/2021.   ?

## 2021-10-18 ENCOUNTER — Encounter: Payer: Self-pay | Admitting: Physician Assistant

## 2021-10-18 ENCOUNTER — Ambulatory Visit (INDEPENDENT_AMBULATORY_CARE_PROVIDER_SITE_OTHER): Payer: Medicare HMO | Admitting: Physician Assistant

## 2021-10-18 DIAGNOSIS — R9431 Abnormal electrocardiogram [ECG] [EKG]: Secondary | ICD-10-CM | POA: Diagnosis not present

## 2021-10-18 DIAGNOSIS — F3112 Bipolar disorder, current episode manic without psychotic features, moderate: Secondary | ICD-10-CM

## 2021-10-18 DIAGNOSIS — F411 Generalized anxiety disorder: Secondary | ICD-10-CM | POA: Diagnosis not present

## 2021-10-18 MED ORDER — RISPERIDONE 2 MG PO TABS: 2.0000 mg | ORAL_TABLET | Freq: Every day | ORAL | 0 refills | Status: DC

## 2021-10-18 NOTE — Progress Notes (Signed)
[facility] ? ?Patient ID: Marie Ramos,  ?MRN: [id number] ? ?PCP: Lawerance Cruel, MD ? ?Date of Evaluation: 10/18/2021 ?Time spent:40 minutes ? ?Chief Complaint:  ?Chief Complaint   ?Follow-up ?  ? ? ?HISTORY/CURRENT STATUS: ?For routine med check. Husband [name] is with is with her.  ? ?Was started on Risperdal 10/01/2021 due to acute mania.  She states she is sleeping better.  [name] agrees but states she is still talking a lot and can get irritable easily.  She is doing a lot of things around the house, cleaning more, looking through old pictures, contacting people that she has not talked with in years.  He reports that is not quite as bad as it was before we started the Risperdal 2 weeks ago.  She "surrendered" her debit card and credit card to her husband.  No grandiosity, paranoia, or hallucinations. ? ?No signs or symptoms of depression.  ADLs and personal hygiene are normal.  She is not isolating.  Anxiety is well controlled.  Xanax is beneficial when she is overwhelmed.  No suicidal or homicidal thoughts. ? ?Since starting the Risperdal she has noticed a mild tremor in hands bilaterally.  [name] says he has not noticed it.  She has complained of occasional dizziness but not severe enough to keep her from doing things that she wants to.  Except for working.  She does want to substitute teach, but her husband does not think it is a good idea due to her mania. ? ?Review of Systems  ?Constitutional: Negative.   ?HENT: Negative.    ?Eyes: Negative.   ?Respiratory: Negative.    ?Cardiovascular: Negative.   ?Gastrointestinal: Negative.   ?Genitourinary: Negative.   ?Musculoskeletal: Negative.   ?Skin: Negative.   ?Neurological: Negative.   ?Endo/Heme/Allergies: Negative.   ?Psychiatric/Behavioral:    ?     See HPI.  ? ? ?Individual Medical History/ Review of Systems: Changes? :Yes    saw Dr. Rayann Heman on 10/10/2021. ? ?Past medications for mental health diagnoses include: ?Lithium caused diarrhea and  increased thirst, Trileptal, Effexor XR, Seroquel worked well, Abilify unknown what happened, Adderall caused  ? ?Allergies: Sulfa antibiotics, Albuterol, Other, Sulfamethoxazole-trimethoprim, Vicodin hp [hydrocodone-acetaminophen], Naproxen, and Vicodin [hydrocodone-acetaminophen] ? ?Current Medications:  ?Current Outpatient Medications:  ?  Acetylcysteine (NAC) 600 MG CAPS, Take 1 capsule (600 mg total) by mouth daily., Disp: 30 capsule, Rfl: 11 ?  ALPRAZolam (XANAX) 0.25 MG tablet, Take 0.5-1 tablets (0.125-0.25 mg total) by mouth 3 (three) times daily as needed for anxiety., Disp: 90 tablet, Rfl: 5 ?  amLODipine (NORVASC) 5 MG tablet, Take 1 tablet (5 mg total) by mouth daily with breakfast., Disp: 30 tablet, Rfl: 0 ?  apixaban (ELIQUIS) 5 MG TABS tablet, Take 1 tablet (5 mg total) by mouth 2 (two) times daily., Disp: 60 tablet, Rfl: 5 ?  Ascorbic Acid (VITAMIN C PO), Take 1 capsule by mouth daily., Disp: , Rfl:  ?  atorvastatin (LIPITOR) 20 MG tablet, Take 1 tablet (20 mg total) by mouth daily at 6 PM., Disp: 30 tablet, Rfl: 0 ?  carvedilol (COREG) 12.5 MG tablet, TAKE ONE TABLET BY MOUTH TWICE DAILY WITH BREAKFAST AND DINNER, Disp: 180 tablet, Rfl: 2 ?  famotidine (PEPCID) 20 MG tablet, Take 20 mg by mouth at bedtime., Disp: , Rfl:  ?  fexofenadine (ALLEGRA) 180 MG tablet, Take 180 mg by mouth daily., Disp: , Rfl:  ?  fluocinonide (LIDEX) 0.05 % external solution, as needed., Disp: , Rfl:  ?  fluticasone (FLONASE) 50 MCG/ACT nasal spray, Place 1 spray into both nostrils daily. Use as directed, Disp: , Rfl:  ?  furosemide (LASIX) 20 MG tablet, TAKE ONE TABLET BY MOUTH ONE TIME DAILY , Disp: 90 tablet, Rfl: 2 ?  Ginkgo Biloba 40 MG TABS, Take 1 tablet (40 mg total) by mouth 2 (two) times daily., Disp: 180 tablet, Rfl: 11 ?  losartan (COZAAR) 100 MG tablet, Take 1 tablet (100 mg total) by mouth daily., Disp: 90 tablet, Rfl: 3 ?  Multiple Vitamin (MULTIVITAMIN WITH MINERALS) TABS tablet, Take 1 tablet by mouth  daily., Disp: , Rfl:  ?  Multiple Vitamins-Minerals (OCUVITE PO), Take 1 capsule by mouth daily., Disp: , Rfl:  ?  Oxcarbazepine (TRILEPTAL) 300 MG tablet, Take 1.5 tablets (450 mg total) by mouth 2 (two) times daily., Disp: 90 tablet, Rfl: 5 ?  Risankizumab-rzaa (SKYRIZI, 150 MG DOSE, [place]), Inject into the skin every 3 (three) months., Disp: , Rfl:  ?  risperiDONE (RISPERDAL) 2 MG tablet, Take 1 tablet (2 mg total) by mouth at bedtime., Disp: 30 tablet, Rfl: 0 ?  triamcinolone ointment (KENALOG) 0.5 %, Apply 1 application topically as needed., Disp: , Rfl: 0 ?  venlafaxine XR (EFFEXOR-XR) 150 MG 24 hr capsule, Take 2 capsules (300 mg total) by mouth at bedtime., Disp: 180 capsule, Rfl: 1 ?Medication Side Effects: none ? ?Family Medical/ Social History: Changes? No ? ?MENTAL HEALTH EXAM: ? ?There were no vitals taken for this visit.There is no height or weight on file to calculate BMI.  ?General Appearance: Casual, Well Groomed, and Obese  ?Eye Contact:  Good  ?Speech:  Clear and Coherent, Normal Rate, and Talkative  ?Volume:  Normal  ?Mood:  Euphoric  ?Affect:  Congruent       ?Thought Process:  Goal Directed and Descriptions of Associations: Circumstantial  ?Orientation:  Full (Time, Place, and Person)  ?Thought Content: Logical   ?Suicidal Thoughts:  No  ?Homicidal Thoughts:  No  ?Memory:  Immediate;   Fair ?Recent;   Fair ?Remote;   Good  ?Judgement:  Good  ?Insight:  Good  ?Psychomotor Activity:   No tremor at this time  ?Concentration:  Concentration: Good and Attention Span: Good  ?Recall:  Good  ?Fund of Knowledge: Good  ?Language: Good  ?Assets:  Desire for Improvement ?Financial Resources/Insurance ?Housing ?Social Support ?Talents/Skills ?Transportation  ?ADL's:  Intact  ?Cognition: WNL  ?Prognosis:  Good  ? ?10/10/2021 progress note Per Dr. Rayann Heman reviewed ?Labs 10/10/2021 CBC, BMP, and magnesium results reviewed ? ?DIAGNOSES:  ?  ICD-10-CM   ?1. Bipolar 1 disorder with moderate mania (HCC)  F31.12   ?  ?2.  Generalized anxiety disorder  F41.1   ?  ?3. QT prolongation  R94.31   ?  ? ? ?Receiving Psychotherapy: No  ? ? ?RECOMMENDATIONS:  ?PDMP reviewed. Last Xanax 09/23/2021. ?I provided 40 minutes of face to face time during this encounter, including time spent before and after the visit in records review, medical decision making, counseling pertinent to today's visit, and charting.  ?On 10/01/2021 I did communicate with Barrington Ellison, PA-C and Fuller Canada RPH-CCP in the same cardiology group of Dr. Rayann Heman.  Megan's response was "Risperidone does have a lower risk of QTc prolongation than other antipsychotics but does still carry the risk - agree with close EKG monitoring after med initiation."  In Dr. Jackalyn Lombard note on 10/10/2021 he cautioned using medications with prolongation of QT interval.  She will follow-up with cardiology in 9  months. ?At this point an antipsychotic is necessary to treat the mania and the patient and her husband understand the risk. ?Since she is slightly better I think Risperdal will be effective, dose needs to be increased though. ? ?Continue Xanax 0.25 mg, 1 p.o. nightly as needed sleep or anxiety. ?Continue Trileptal 300 mg, 1.5 pills p.o. twice daily. ?Increase Risperdal to 2 mg nightly. ?Continue Effexor XR 150 mg, 2 p.o. daily. ?Continue NAC, multivitamin, B complex, fish oil, ginkgo biloba. ?Return in 2 weeks. ? ?Avoid drugs that are known to cause prolonged QT interval ? ?Donnal Moat, PA-C  ?

## 2021-10-20 ENCOUNTER — Other Ambulatory Visit: Payer: Self-pay

## 2021-10-21 MED ORDER — ALPRAZOLAM 0.25 MG PO TABS: 0.1250 mg | ORAL_TABLET | Freq: Three times a day (TID) | ORAL | 5 refills | Status: DC | PRN

## 2021-10-22 ENCOUNTER — Telehealth: Payer: Self-pay | Admitting: Physician Assistant

## 2021-10-22 NOTE — Telephone Encounter (Signed)
Patient c/o hand tremors that she relates to Risperdal. She thought she had been on it 2-3 weeks, but looks like it has only been a few days. She said you had another medication you could prescribe to address the tremors. Patient does not describe any other SE. She was very pleasant, laughing, and talking about purchasing a new car yesterday.  ? ? ?Pharmacy - [facility] in [place].  ?

## 2021-10-22 NOTE — Telephone Encounter (Signed)
She has only been on Risperdal for a few weeks and this dose only a few days, so I prefer to wait on adding another medication.  At the last visit it had not been bothering her at all although she did mention a mild tremor.  If it starts affecting ADLs, example if she is not able to eat because food falling off the fork or spoon, then we will start Cogentin.  Have her call us back if that is the case.  Otherwise she and I will discuss it at the appointment on 11/06/2021.  Thanks.

## 2021-10-22 NOTE — Telephone Encounter (Signed)
Pt advised Marie Ramos put her on Risperdal recently.  She reports hands shaking and dizziness.  Pls call her back. ? ?Next appt 5/3 ?

## 2021-10-23 NOTE — Telephone Encounter (Signed)
Noted  

## 2021-10-23 NOTE — Telephone Encounter (Signed)
Patient notified of recommendations. She said she will wait until her next appointment to discuss this with you.  ?

## 2021-10-28 DIAGNOSIS — N898 Other specified noninflammatory disorders of vagina: Secondary | ICD-10-CM | POA: Diagnosis not present

## 2021-10-28 DIAGNOSIS — R350 Frequency of micturition: Secondary | ICD-10-CM | POA: Diagnosis not present

## 2021-10-28 DIAGNOSIS — K1120 Sialoadenitis, unspecified: Secondary | ICD-10-CM | POA: Diagnosis not present

## 2021-10-29 ENCOUNTER — Telehealth: Payer: Self-pay

## 2021-10-29 NOTE — Telephone Encounter (Signed)
Spoke with patient and advised [employer] home monitor is disconnected.  Advised to call [employer] tech support number for assistance.  She will call to get assistance with monitor.  Rescheduled ICM remote transmission for 5/2. ?

## 2021-11-06 ENCOUNTER — Telehealth: Payer: Self-pay

## 2021-11-06 ENCOUNTER — Ambulatory Visit (INDEPENDENT_AMBULATORY_CARE_PROVIDER_SITE_OTHER): Payer: Medicare HMO | Admitting: Physician Assistant

## 2021-11-06 ENCOUNTER — Telehealth: Payer: Self-pay | Admitting: Internal Medicine

## 2021-11-06 ENCOUNTER — Encounter: Payer: Self-pay | Admitting: Physician Assistant

## 2021-11-06 DIAGNOSIS — F3112 Bipolar disorder, current episode manic without psychotic features, moderate: Secondary | ICD-10-CM | POA: Diagnosis not present

## 2021-11-06 DIAGNOSIS — F99 Mental disorder, not otherwise specified: Secondary | ICD-10-CM | POA: Diagnosis not present

## 2021-11-06 DIAGNOSIS — R69 Illness, unspecified: Secondary | ICD-10-CM | POA: Diagnosis not present

## 2021-11-06 DIAGNOSIS — I469 Cardiac arrest, cause unspecified: Secondary | ICD-10-CM

## 2021-11-06 DIAGNOSIS — R9431 Abnormal electrocardiogram [ECG] [EKG]: Secondary | ICD-10-CM

## 2021-11-06 DIAGNOSIS — I5032 Chronic diastolic (congestive) heart failure: Secondary | ICD-10-CM

## 2021-11-06 DIAGNOSIS — F5105 Insomnia due to other mental disorder: Secondary | ICD-10-CM | POA: Diagnosis not present

## 2021-11-06 DIAGNOSIS — R600 Localized edema: Secondary | ICD-10-CM | POA: Diagnosis not present

## 2021-11-06 DIAGNOSIS — F411 Generalized anxiety disorder: Secondary | ICD-10-CM

## 2021-11-06 NOTE — Telephone Encounter (Signed)
Pt c/o swelling: STAT is pt has developed SOB within 24 hours ? ?How much weight have you gained and in what time span?  ?No weight gain ? ?If swelling, where is the swelling located?  ?Legs, ankles, feet (mainly left) ? ?Are you currently taking a fluid pill?  ?Yes, patient takes Lasix 20 MG as prescribed ? ?Are you currently SOB?  ?No  ? ?Do you have a log of your daily weights (if so, list)?  ?4/24: 260 lbs 128/81 82 ? ?Have you gained 3 pounds in a day or 5 pounds in a week?  ?No  ? ?Have you traveled recently?  ? ? ?

## 2021-11-06 NOTE — Telephone Encounter (Signed)
Received voice mail message from patient stating she is waiting on new remote monitor to be send to home.  Once received, she will send ICM remote transmission.  ICM remote transmission rescheduled to 11/18/2021. ?

## 2021-11-06 NOTE — Progress Notes (Signed)
[facility] ? ?Patient ID: Marie Ramos,  ?MRN: [id number] ? ?PCP: Lawerance Cruel, MD ? ?Date of Evaluation: 11/06/2021 ?Time spent:40 minutes ? ?Chief Complaint:  ?Chief Complaint   ?Follow-up ?  ? ? ?HISTORY/CURRENT STATUS: ?For routine med check. Husband [name] is with is with her.  ? ?Last seen almost 3 weeks ago.  Was still manic after being on Risperdal for 2 weeks.  Dose was doubled.  She is sleeping more, when she wakes up every morning and has to use the bathroom she is able to go back to sleep whereas that was more difficult when the mania first hit.  She is enjoying baking more which seems new.  Does not seem to be as hyper or confused as she had been.  We are not letting her drive, as she had gotten lost about a month ago and did not show up for work for about 5 hours.  It is hard to know how she would do though if she was driving now.  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  She is not cleaning as much as she had been or going through photos, things like that when she was more manic.  She is still talking to friends and family that she has not talked to in years but does not have this extreme sense of urgency and increased talkativeness like she had been having.  Not spending a lot of money as she surrendered her credit and debit cards to her husband over a month ago.  No paranoia, no hallucinations. ? ?States she's been having LE edema bilat 'since we increased the Risperdal.'  That was on 10/18/2021.  She is on Lasix and takes it faithfully.  She has shortness of breath but that is chronic and no worse than normal.  Denies pain in her calves.  No palpitations, tachycardia or chest pain.  Her husband states that he noticed the swelling 2 or 3 days ago.  Pt states she saw cardiology on 10/28/2021.  States he did not mention the swelling in her legs. According to the chart, the last visit  was 10/10/2021 with Dr. Rayann Heman.  She did see another provider on 10/28/2021, I am just not sure who that was. She mentioned Dr. Harrington Challenger, but that was 'awhile ago.' I don't see a visit for that date in Epic or under 'care everywhere.' ? ?Patient denies loss of interest in usual activities and is able to enjoy things.  ADLs and personal hygiene are normal.  Appetite has not changed.  Weight is stable.  No extreme sadness, tearfulness, or feelings of hopelessness.  Denies any changes in concentration, making decisions or remembering things.  Does have some anxiety but not terrible.  Xanax helps.  Denies suicidal or homicidal thoughts. ? ?Review of Systems  ?Constitutional: Negative.   ?HENT: Negative.    ?Eyes: Negative.   ?Respiratory:  Positive for shortness of breath.   ?     See HPI  ?Cardiovascular:  Positive for leg swelling.  ?     See HPI  ?Gastrointestinal: Negative.   ?Genitourinary: Negative.   ?Musculoskeletal: Negative.   ?Skin: Negative.   ?Neurological: Negative.   ?Endo/Heme/Allergies: Negative.   ?Psychiatric/Behavioral:    ?     See HPI  ? ? ?Individual Medical History/ Review of Systems: Changes? :Yes   see HPI. ? ?Past medications for mental  health diagnoses include: ?Lithium caused diarrhea and increased thirst, Trileptal, Effexor XR, Seroquel worked well, Abilify unknown what happened, Adderall caused  ? ?Allergies: Sulfa antibiotics, Albuterol, Other, Sulfamethoxazole-trimethoprim, Vicodin hp [hydrocodone-acetaminophen], Naproxen, and Vicodin [hydrocodone-acetaminophen] ? ?Current Medications:  ?Current Outpatient Medications:  ?  Acetylcysteine (NAC) 600 MG CAPS, Take 1 capsule (600 mg total) by mouth daily., Disp: 30 capsule, Rfl: 11 ?  ALPRAZolam (XANAX) 0.25 MG tablet, Take 0.5-1 tablets (0.125-0.25 mg total) by mouth 3 (three) times daily as needed for anxiety., Disp: 90 tablet, Rfl: 5 ?  amLODipine (NORVASC) 5 MG tablet, Take 1 tablet (5 mg total) by mouth daily with breakfast., Disp: 30 tablet,  Rfl: 0 ?  apixaban (ELIQUIS) 5 MG TABS tablet, Take 1 tablet (5 mg total) by mouth 2 (two) times daily., Disp: 60 tablet, Rfl: 5 ?  Ascorbic Acid (VITAMIN C PO), Take 1 capsule by mouth daily., Disp: , Rfl:  ?  atorvastatin (LIPITOR) 20 MG tablet, Take 1 tablet (20 mg total) by mouth daily at 6 PM., Disp: 30 tablet, Rfl: 0 ?  carvedilol (COREG) 12.5 MG tablet, TAKE ONE TABLET BY MOUTH TWICE DAILY WITH BREAKFAST AND DINNER, Disp: 180 tablet, Rfl: 2 ?  famotidine (PEPCID) 20 MG tablet, Take 20 mg by mouth at bedtime., Disp: , Rfl:  ?  fexofenadine (ALLEGRA) 180 MG tablet, Take 180 mg by mouth daily., Disp: , Rfl:  ?  fluticasone (FLONASE) 50 MCG/ACT nasal spray, Place 1 spray into both nostrils daily. Use as directed, Disp: , Rfl:  ?  furosemide (LASIX) 20 MG tablet, TAKE ONE TABLET BY MOUTH ONE TIME DAILY , Disp: 90 tablet, Rfl: 2 ?  Ginkgo Biloba 40 MG TABS, Take 1 tablet (40 mg total) by mouth 2 (two) times daily., Disp: 180 tablet, Rfl: 11 ?  losartan (COZAAR) 100 MG tablet, Take 1 tablet (100 mg total) by mouth daily., Disp: 90 tablet, Rfl: 3 ?  Multiple Vitamin (MULTIVITAMIN WITH MINERALS) TABS tablet, Take 1 tablet by mouth daily., Disp: , Rfl:  ?  Multiple Vitamins-Minerals (OCUVITE PO), Take 1 capsule by mouth daily., Disp: , Rfl:  ?  Oxcarbazepine (TRILEPTAL) 300 MG tablet, Take 1.5 tablets (450 mg total) by mouth 2 (two) times daily., Disp: 90 tablet, Rfl: 5 ?  risperiDONE (RISPERDAL) 2 MG tablet, Take 1 tablet (2 mg total) by mouth at bedtime., Disp: 30 tablet, Rfl: 0 ?  triamcinolone ointment (KENALOG) 0.5 %, Apply 1 application topically as needed., Disp: , Rfl: 0 ?  venlafaxine XR (EFFEXOR-XR) 150 MG 24 hr capsule, Take 2 capsules (300 mg total) by mouth at bedtime., Disp: 180 capsule, Rfl: 1 ?  fluocinonide (LIDEX) 0.05 % external solution, as needed. (Patient not taking: Reported on 11/06/2021), Disp: , Rfl:  ?  Risankizumab-rzaa (SKYRIZI, 150 MG DOSE, [place]), Inject into the skin every 3 (three) months.  (Patient not taking: Reported on 11/06/2021), Disp: , Rfl:  ?Medication Side Effects: none ? ?Family Medical/ Social History: Changes? No ? ?MENTAL HEALTH EXAM: ? ?There were no vitals taken for this visit.There is no height or weight on file to calculate BMI.  ?General Appearance: Casual, Well Groomed, and Obese  ?Eye Contact:  Good  ?Speech:  Clear and Coherent, Normal Rate, and not as talkative as she has been the past 2 visits over the past month.  ?Volume:  Normal  ?Mood:  Euthymic  ?Affect:  Congruent       ?Thought Process:  Goal Directed and Descriptions of Associations: Circumstantial  ?Orientation:  Full (Time, Place, and Person)  ?Thought Content: Logical   ?Suicidal Thoughts:  No  ?Homicidal Thoughts:  No  ?Memory:  Immediate;   Fair ?Recent;   Fair ?Remote;   Good  ?Judgement:  Good  ?Insight:  Good  ?Psychomotor Activity:  Normal  ?Concentration:  Concentration: Good and Attention Span: Good  ?Recall:  Good  ?Fund of Knowledge: Good  ?Language: Good  ?Assets:  Desire for Improvement ?Financial Resources/Insurance ?Housing ?Social Support ?Talents/Skills ?Transportation  ?ADL's:  Intact  ?Cognition: WNL  ?Prognosis:  Good  ? ?Lower extremities, 2+ pitting edema in the left lower extremity, 1+ pitting edema on the right.  Negative Homans' sign bilaterally. ? ? ?DIAGNOSES:  ?  ICD-10-CM   ?1. Bipolar 1 disorder with moderate mania (HCC)  F31.12   ?  ?2. Generalized anxiety disorder  F41.1   ?  ?3. Insomnia due to other mental disorder  F51.05   ? F99   ?  ?4. QT prolongation  R94.31   ?  ?5. Lower extremity edema  R60.0   ?  ?6. Chronic illness  R69   ?  ? ? ? ?Receiving Psychotherapy: No  ? ? ?RECOMMENDATIONS:  ?PDMP reviewed. Last Xanax 10/24/2021. ?I provided 40 minutes of face to face time during this encounter, including time spent before and after the visit in records review, medical decision making, counseling pertinent to today's visit, and charting.  ?We discussed the bilateral lower extremity  edema. I have asked her to get in touch with her cardiologist today to discuss with them.  She has a history of DVT and has CHF, although on Eliquis and Lasix, there could be something going on. I would like

## 2021-11-06 NOTE — Telephone Encounter (Signed)
Outreach made to Pt. ?Per Pt she was started on Risperdal a few weeks ago.  Since she has started this medication she has noticed swelling in her legs.  She denies SOB. ? ?Her doctor would like to double her current dose of risperdal.  She is wanting to know what she can do in regards to the swelling. ? ?Will discuss. ?

## 2021-11-06 NOTE — Telephone Encounter (Signed)
Discussed with DOD. ? ?Ok to take an additional dose of lasix today. ? ?Also discussed with Dr. Rayann Heman.  Per Dr. Rayann Heman refer Pt to gen cards for management of CDHF and edema. ? ?New Pt appt made for 11/08/21 with Dr. Ali Lowe to establish care and further fluid management. ? ?Pt in agreement with appt. ? ?Await further needs. ? ? ?

## 2021-11-07 ENCOUNTER — Telehealth: Payer: Self-pay | Admitting: Physician Assistant

## 2021-11-07 NOTE — Telephone Encounter (Signed)
Late entry:  Pt advised that her home monitor was not working.  She has contacted industry and they are shipping her a new system. ?

## 2021-11-07 NOTE — Progress Notes (Addendum)
?Cardiology Office Note:   ? ?Date:  11/08/2021  ? ?ID:  EMMALI KAROW, DOB Apr 14, 1956, MRN [id number] ? ?PCP:  Lawerance Cruel, MD  ? ?[facility] Providers ?Cardiologist:  Lenna Sciara, MD ?Referring MD: Lawerance Cruel, MD  ? ?Chief Complaint/Reason for Referral: Dyspnea ? ?ASSESSMENT:   ? ?1. Chronic diastolic congestive heart failure ([place])   ?2. Stage 3a chronic kidney disease ([place])   ?3. Cardiac arrest ([place])   ?4. Essential hypertension   ?5. Cerebrovascular accident (CVA) due to embolism of cerebral artery ([place])   ?6. BMI 45.0-49.9, adult ([place])   ? ? ?PLAN:   ? ?In order of problems listed above: ?1.  Chronic diastolic heart failure: We will start Jardiance 10 mg daily and Lasix 20 mg twice daily.  Her leg swelling has been relatively new and she has been on Norvasc for quite a while.  If needed we could stop this medication in the future and adjust her other antihypertensives.  Continue Cozaar and Coreg.  Will check BMP in 1 week.  Follow-up in 9 months or earlier if needed. ?2.  Stage III chronic kidney disease: Monitor for now we will check BMP next week. ?3.  Cardiac arrest: Status post ICD and followed by the EP division ?4.  Hypertension: Blood pressure on initial measure was elevated today but repeat measure after several minutes was within goal.   ?5.  History of stroke: The patient had an isolated DVT in 2017 but due to a stroke and low atrial fibrillation burden.  I reviewed this with Tommye Standard of the EP division.  We will stop the patient's anticoagulation.  She will be continually monitored.  If she has significant burden of atrial fibrillation in the future, we can restart anticoagulation.  I confirmed with her that this was logistically feasible.  I will start aspirin 81 mg daily. ?6.  Elevated BMI:  Will refer to Pharmacy for recommendations. ?     ? ?Dispo:  Return in about 9 months (around 08/11/2022).  ? ?  ? ?Medication Adjustments/Labs and Tests Ordered: ?Current medicines are  reviewed at length with the patient today.  Concerns regarding medicines are outlined above. ? ?The following changes have been made:    ? ?Labs/tests ordered: ?No orders of the defined types were placed in this encounter. ? ? ?Medication Changes: ?No orders of the defined types were placed in this encounter. ? ? ?Current medicines are reviewed at length with the patient today.  The patient does not have concerns regarding medicines. ? ? ?History of Present Illness:   ? ?FOCUSED PROBLEM LIST:   ?1.  VF arrest status post ICD with noted long QT; cardiac catheterization 2017 with no obstructive coronary artery disease ?2.  Chronic kidney disease with GFR of 49 and a serum creatinine of 1.2 ?3.  Prior stroke and per EP note subclinical atrial fibrillation so remains on Eliquis ?4.  Hypertension ?5.  BMI 49 ?6.  Status post OptiVol monitoring device ? ?The patient is a 66 y.o. female with the indicated medical history here to establish general cardiology care.  The patient is followed by the EP division given her subclinical atrial fibrillation and ICD placement for ventricular fibrillatory arrest thought due to long QT syndrome.  The patient contacted the office recently due to shortness of breath. ? ?The patient has been short of breath for many years.  I reviewed her weights over the last several years and they have been stable.  Shortness  of breath is not a new issue.  She noticed a little bit more peripheral edema particularly in the evenings.  This is sometimes associated with difficulty putting her shoes on.  She tells me she is short of breath with walking.  She is not short of breath at rest.  She is unsure if she is short of breath when she lies down but she only uses 1 pillow to sleep.  She denies any exertional angina, presyncope, syncope, palpitations, signs or symptoms of stroke, or severe bleeding while on Eliquis.  She has required no emergency room visits or hospitalizations. ?   ?  ?Current  Medications: ?Current Meds  ?Medication Sig  ? Acetylcysteine (NAC) 600 MG CAPS Take 1 capsule (600 mg total) by mouth daily.  ? ALPRAZolam (XANAX) 0.25 MG tablet Take 0.5-1 tablets (0.125-0.25 mg total) by mouth 3 (three) times daily as needed for anxiety.  ? amLODipine (NORVASC) 5 MG tablet Take 1 tablet (5 mg total) by mouth daily with breakfast.  ? apixaban (ELIQUIS) 5 MG TABS tablet Take 1 tablet (5 mg total) by mouth 2 (two) times daily.  ? Ascorbic Acid (VITAMIN C PO) Take 1 capsule by mouth daily.  ? atorvastatin (LIPITOR) 20 MG tablet Take 1 tablet (20 mg total) by mouth daily at 6 PM.  ? carvedilol (COREG) 12.5 MG tablet TAKE ONE TABLET BY MOUTH TWICE DAILY WITH BREAKFAST AND DINNER  ? famotidine (PEPCID) 20 MG tablet Take 20 mg by mouth at bedtime.  ? fexofenadine (ALLEGRA) 180 MG tablet Take 180 mg by mouth daily.  ? fluocinonide (LIDEX) 0.05 % external solution as needed.  ? fluticasone (FLONASE) 50 MCG/ACT nasal spray Place 1 spray into both nostrils daily. Use as directed  ? furosemide (LASIX) 20 MG tablet TAKE ONE TABLET BY MOUTH ONE TIME DAILY   ? Ginkgo Biloba 40 MG TABS Take 1 tablet (40 mg total) by mouth 2 (two) times daily.  ? losartan (COZAAR) 100 MG tablet Take 1 tablet (100 mg total) by mouth daily.  ? Multiple Vitamin (MULTIVITAMIN WITH MINERALS) TABS tablet Take 1 tablet by mouth daily.  ? Multiple Vitamins-Minerals (OCUVITE PO) Take 1 capsule by mouth daily.  ? Oxcarbazepine (TRILEPTAL) 300 MG tablet Take 1.5 tablets (450 mg total) by mouth 2 (two) times daily.  ? Risankizumab-rzaa (SKYRIZI, 150 MG DOSE, [place]) Inject into the skin every 3 (three) months.  ? risperiDONE (RISPERDAL) 2 MG tablet Take 1 tablet (2 mg total) by mouth at bedtime.  ? triamcinolone ointment (KENALOG) 0.5 % Apply 1 application topically as needed.  ? venlafaxine XR (EFFEXOR-XR) 150 MG 24 hr capsule Take 2 capsules (300 mg total) by mouth at bedtime.  ?  ? ?Allergies:    ?Sulfa antibiotics, Albuterol, Other,  Sulfamethoxazole-trimethoprim, Vicodin hp [hydrocodone-acetaminophen], Naproxen, and Vicodin [hydrocodone-acetaminophen]  ? ?Social History:   ?Social History  ? ?Tobacco Use  ? Smoking status: Never  ? Smokeless tobacco: Never  ?Vaping Use  ? Vaping Use: Never used  ?Substance Use Topics  ? Alcohol use: No  ? Drug use: No  ?  ? ?Family Hx: ?Family History  ?Problem Relation Age of Onset  ? Cancer Mother   ? Depression Mother   ? CAD Mother   ? COPD Father   ? Depression Father   ? Diabetes Maternal Grandfather   ? Depression Maternal Grandfather   ? Arthritis/Rheumatoid Paternal Grandmother   ?  ? ?Review of Systems:   ?Please see the history of present illness.    ?  All other systems reviewed and are negative. ?  ? ? ?EKGs/Labs/Other Test Reviewed:   ? ?EKG:  EKG performed April 2023 that I personally reviewed demonstrates sinus rhythm. ? ?Prior CV studies: ? ?2022 carotid ultrasound with mild disease bilaterally ? ?2022 TTE ejection fraction of 65 to 70% with left ventricular hypertrophy of basal septum and no significant valvular abnormalities ? ?2017 coronary angiography with no obstructive disease ? ?2017 cardiac MRI with no late gadolinium enhancement with ejection fraction of 45% and moderate concentric hypertrophy ? ?Other studies Reviewed: ?Review of the additional studies/records demonstrates: None relevant ? ?Recent Labs: ?10/10/2021: BUN 25; Creatinine, Ser 1.21; Hemoglobin 11.8; Magnesium 2.5; Platelets 501; Potassium 5.2; Sodium 142  ? ?Recent Lipid Panel ?Lab Results  ?Component Value Date/Time  ? CHOL 225 (H) 09/03/2015 02:48 AM  ? TRIG 128 08/16/2019 04:54 PM  ? HDL 43 09/03/2015 02:48 AM  ? LDLCALC 115 (H) 09/03/2015 02:48 AM  ? ? ?Risk Assessment/Calculations:   ? ?  ?    ? ?Physical Exam:   ? ?VS:  BP (!) 160/82   Pulse 86   Ht '5\' 2"'$  (1.575 m)   Wt 268 lb (121.6 kg)   SpO2 92%   BMI 49.02 kg/m?    ?Wt Readings from Last 3 Encounters:  ?11/08/21 268 lb (121.6 kg)  ?10/10/21 250 lb (113.4 kg)   ?05/08/21 258 lb (117 kg)  ?  ?GENERAL:  No apparent distress, AOx3 ?HEENT:  No carotid bruits, +2 carotid impulses, no scleral icterus ?CAR: RRR no murmurs, gallops, rubs, or thrills ?RES:  Clear to auscultation bilaterally ?

## 2021-11-07 NOTE — Telephone Encounter (Signed)
Spoke with patient regarding future appt and she informed me that she has a visit with the cardiologist Marie Ramos for this Friday May 5th. She wanted me to let you know. ?

## 2021-11-07 NOTE — Telephone Encounter (Signed)
Noted.  I spoke with Vick Frees, PA-C via email yesterday about Fran's signs/sx. Appreciate cardiology input. ?

## 2021-11-08 ENCOUNTER — Ambulatory Visit (INDEPENDENT_AMBULATORY_CARE_PROVIDER_SITE_OTHER): Payer: Medicare HMO | Admitting: Internal Medicine

## 2021-11-08 ENCOUNTER — Encounter: Payer: Self-pay | Admitting: Internal Medicine

## 2021-11-08 VITALS — BP 140/72 | HR 86 | Ht 62.0 in | Wt 268.0 lb

## 2021-11-08 DIAGNOSIS — I10 Essential (primary) hypertension: Secondary | ICD-10-CM | POA: Diagnosis not present

## 2021-11-08 DIAGNOSIS — N1831 Chronic kidney disease, stage 3a: Secondary | ICD-10-CM

## 2021-11-08 DIAGNOSIS — I5032 Chronic diastolic (congestive) heart failure: Secondary | ICD-10-CM | POA: Diagnosis not present

## 2021-11-08 DIAGNOSIS — I469 Cardiac arrest, cause unspecified: Secondary | ICD-10-CM

## 2021-11-08 DIAGNOSIS — I6340 Cerebral infarction due to embolism of unspecified cerebral artery: Secondary | ICD-10-CM | POA: Diagnosis not present

## 2021-11-08 DIAGNOSIS — Z6841 Body Mass Index (BMI) 40.0 and over, adult: Secondary | ICD-10-CM

## 2021-11-08 MED ORDER — ASPIRIN EC 81 MG PO TBEC: 81.0000 mg | DELAYED_RELEASE_TABLET | Freq: Every day | ORAL | 3 refills | Status: AC

## 2021-11-08 MED ORDER — FUROSEMIDE 20 MG PO TABS: 20.0000 mg | ORAL_TABLET | Freq: Two times a day (BID) | ORAL | 6 refills | Status: DC

## 2021-11-08 MED ORDER — EMPAGLIFLOZIN 10 MG PO TABS: 10.0000 mg | ORAL_TABLET | Freq: Every day | ORAL | 3 refills | Status: DC

## 2021-11-08 NOTE — Patient Instructions (Addendum)
Medication Instructions:  ?STOP APIXOBAN ?START JARDIANCE 10 MG EVERY DAY  ?ASPIRIN 81 MG EVERY DAY  ?*If you need a refill on your cardiac medications before your next appointment, please call your pharmacy* ? ? ?Lab Work: ?BMET TODAY ?If you have labs (blood work) drawn today and your tests are completely normal, you will receive your results only by: ?MyChart Message (if you have MyChart) OR ?A paper copy in the mail ?If you have any lab test that is abnormal or we need to change your treatment, we will call you to review the results. ? ? ?Testing/Procedures: ?NONE ? ? ?Follow-Up: ?At [facility], you and your health needs are our priority.  As part of our continuing mission to provide you with exceptional heart care, we have created designated Provider Care Teams.  These Care Teams include your primary Cardiologist (physician) and Advanced Practice Providers (APPs -  Physician Assistants and Nurse Practitioners) who all work together to provide you with the care you need, when you need it. ? ?We recommend signing up for the patient portal called "MyChart".  Sign up information is provided on this After Visit Summary.  MyChart is used to connect with patients for Virtual Visits (Telemedicine).  Patients are able to view lab/test results, encounter notes, upcoming appointments, etc.  Non-urgent messages can be sent to your provider as well.   ?To learn more about what you can do with MyChart, go to [email or web address].   ? ?Your next appointment:   ?9 month(s) ? ?The format for your next appointment:   ?In Person ? ?Provider:   ?DR Ali Lowe  ? ? ?Other Instructions ?REFERRAL TO PHARMACY  ? ?Important Information About Sugar ? ? ? ? ?  ?

## 2021-11-11 ENCOUNTER — Other Ambulatory Visit: Payer: Self-pay | Admitting: Physician Assistant

## 2021-11-11 ENCOUNTER — Ambulatory Visit (INDEPENDENT_AMBULATORY_CARE_PROVIDER_SITE_OTHER): Payer: Medicare HMO

## 2021-11-11 ENCOUNTER — Telehealth: Payer: Self-pay

## 2021-11-11 DIAGNOSIS — Z95810 Presence of automatic (implantable) cardiac defibrillator: Secondary | ICD-10-CM

## 2021-11-11 DIAGNOSIS — I5032 Chronic diastolic (congestive) heart failure: Secondary | ICD-10-CM

## 2021-11-11 NOTE — Progress Notes (Signed)
EPIC Encounter for ICM Monitoring ? ?Patient Name: Marie Ramos is a 66 y.o. female ?Date: 11/11/2021 ?Primary Care Physican: Lawerance Cruel, MD ?Primary Cardiologist: Ali Lowe ?Electrophysiologist: Allred ?07/17/2021 Weight: 264 lbs ?11/11/2021 Weight: 262 lbs ?  ?Time in AT/AF  0.0 hr/day (0.0%) (taking Eliquis) ?  ?      Spoke with patient and heart failure questions reviewed.  Pt reports Lasix increased to twice at 5/5 OV due to swelling of ankle/feet and wheezing during 5/5 OV.   ?  ?      Optivol thoracic impedance suggesting possible fluid accumulation starting 4/1. ?  ?Prescribed:  ?Furosemide 20 mg 1 tablet by mouth twice a day (increased dosage started 5/6). ?Jardiance 10 mg take 1 tablet by mouth daily. ? ?Labs: ?11/12/2021 BMET scheduled ?10/10/2021 Creatinine 1.21, BUN 25, Potassium 5.2, Sodium 142 ?A complete set of results can be found in Results Review. ?  ?Recommendations:   Advised to limit salt intake to 2000 mg daily.  Explained fluid levels may not have improved yet since Lasix was just increased at 5/5 OV.   Sent to Dr. Ali Lowe as Juluis Rainier and review following 5/5 OV.     ?  ?Follow-up plan: ICM clinic phone appointment on 11/18/2021 to recheck fluid level.  91 day device clinic remote transmission 11/14/2021.    ?  ?EP/Cardiology Office Visits:  Recall 11/04/2021 with Tommye Standard, PA.  ?  ?Copy of ICM check sent to Dr. Rayann Heman.  ? ?3 month ICM trend: 11/11/2021. ? ? ? ?12-14 Month ICM trend:  ? ? ? ?[name], RN ?11/11/2021 ?11:22 AM ? ?

## 2021-11-11 NOTE — Telephone Encounter (Signed)
Pt called for a refill of Risperdal to [facility]. ? ?Next appt 5/22 ?

## 2021-11-11 NOTE — Telephone Encounter (Signed)
Returned call to patient as requested by voice mail message regarding new Carelink monitor.  Assisted with sending remote transmission for fluid level review.  See ICM note for remote transmission review.  ?

## 2021-11-11 NOTE — Progress Notes (Signed)
[name], MD  [name], RN ?Thanks  ?

## 2021-11-12 ENCOUNTER — Other Ambulatory Visit: Payer: Medicare HMO

## 2021-11-14 ENCOUNTER — Ambulatory Visit (INDEPENDENT_AMBULATORY_CARE_PROVIDER_SITE_OTHER): Payer: Medicare HMO

## 2021-11-14 DIAGNOSIS — I469 Cardiac arrest, cause unspecified: Secondary | ICD-10-CM | POA: Diagnosis not present

## 2021-11-14 LAB — CUP PACEART REMOTE DEVICE CHECK
Battery Remaining Longevity: 31 mo
Battery Voltage: 2.95 V
Brady Statistic AP VP Percent: 0 %
Brady Statistic AP VS Percent: 0.01 %
Brady Statistic AS VP Percent: 0.04 %
Brady Statistic AS VS Percent: 99.95 %
Brady Statistic RA Percent Paced: 0.01 %
Brady Statistic RV Percent Paced: 0.04 %
Date Time Interrogation Session: 20230511033426
HighPow Impedance: 75 Ohm
Implantable Lead Implant Date: 20170227
Implantable Lead Implant Date: 20170227
Implantable Lead Location: 753859
Implantable Lead Location: 753860
Implantable Lead Model: 5076
Implantable Pulse Generator Implant Date: 20170227
Lead Channel Impedance Value: 399 Ohm
Lead Channel Impedance Value: 456 Ohm
Lead Channel Impedance Value: 532 Ohm
Lead Channel Pacing Threshold Amplitude: 0.5 V
Lead Channel Pacing Threshold Amplitude: 0.625 V
Lead Channel Pacing Threshold Pulse Width: 0.4 ms
Lead Channel Pacing Threshold Pulse Width: 0.4 ms
Lead Channel Sensing Intrinsic Amplitude: 2.875 mV
Lead Channel Sensing Intrinsic Amplitude: 2.875 mV
Lead Channel Sensing Intrinsic Amplitude: 22.375 mV
Lead Channel Sensing Intrinsic Amplitude: 22.375 mV
Lead Channel Setting Pacing Amplitude: 2 V
Lead Channel Setting Pacing Amplitude: 2.5 V
Lead Channel Setting Pacing Pulse Width: 0.4 ms
Lead Channel Setting Sensing Sensitivity: 0.3 mV

## 2021-11-15 ENCOUNTER — Other Ambulatory Visit: Payer: Medicare HMO | Admitting: *Deleted

## 2021-11-15 ENCOUNTER — Other Ambulatory Visit: Payer: Self-pay | Admitting: Physician Assistant

## 2021-11-15 ENCOUNTER — Other Ambulatory Visit: Payer: Self-pay

## 2021-11-15 DIAGNOSIS — N1831 Chronic kidney disease, stage 3a: Secondary | ICD-10-CM | POA: Diagnosis not present

## 2021-11-15 LAB — BASIC METABOLIC PANEL
BUN/Creatinine Ratio: 39 — ABNORMAL HIGH (ref 12–28)
BUN: 41 mg/dL — ABNORMAL HIGH (ref 8–27)
CO2: 29 mmol/L (ref 20–29)
Calcium: 9.1 mg/dL (ref 8.7–10.3)
Chloride: 98 mmol/L (ref 96–106)
Creatinine, Ser: 1.05 mg/dL — ABNORMAL HIGH (ref 0.57–1.00)
Glucose: 103 mg/dL — ABNORMAL HIGH (ref 70–99)
Potassium: 5.1 mmol/L (ref 3.5–5.2)
Sodium: 139 mmol/L (ref 134–144)
eGFR: 59 mL/min/{1.73_m2} — ABNORMAL LOW (ref >59)

## 2021-11-15 MED ORDER — VENLAFAXINE HCL ER 150 MG PO CP24: 300.0000 mg | ORAL_CAPSULE | Freq: Every evening | ORAL | 1 refills | Status: DC

## 2021-11-18 ENCOUNTER — Telehealth: Payer: Self-pay

## 2021-11-18 ENCOUNTER — Telehealth: Payer: Self-pay | Admitting: Physician Assistant

## 2021-11-18 ENCOUNTER — Ambulatory Visit (INDEPENDENT_AMBULATORY_CARE_PROVIDER_SITE_OTHER): Payer: Medicare HMO

## 2021-11-18 DIAGNOSIS — H02423 Myogenic ptosis of bilateral eyelids: Secondary | ICD-10-CM | POA: Diagnosis not present

## 2021-11-18 DIAGNOSIS — I5032 Chronic diastolic (congestive) heart failure: Secondary | ICD-10-CM

## 2021-11-18 DIAGNOSIS — H2513 Age-related nuclear cataract, bilateral: Secondary | ICD-10-CM | POA: Diagnosis not present

## 2021-11-18 DIAGNOSIS — H353132 Nonexudative age-related macular degeneration, bilateral, intermediate dry stage: Secondary | ICD-10-CM | POA: Diagnosis not present

## 2021-11-18 DIAGNOSIS — H43393 Other vitreous opacities, bilateral: Secondary | ICD-10-CM | POA: Diagnosis not present

## 2021-11-18 DIAGNOSIS — Z95810 Presence of automatic (implantable) cardiac defibrillator: Secondary | ICD-10-CM

## 2021-11-18 NOTE — Telephone Encounter (Signed)
Remote ICM transmission received.  Attempted call to patient regarding ICM remote transmission and left detailed message per DPR.  Left message to return call. ?

## 2021-11-18 NOTE — Progress Notes (Signed)
EPIC Encounter for ICM Monitoring ? ?Patient Name: Marie Ramos is a 66 y.o. female ?Date: 11/18/2021 ?Primary Care Physican: Lawerance Cruel, MD ?Primary Cardiologist: Ali Lowe ?Electrophysiologist: Allred ?07/17/2021 Weight: 264 lbs ?11/11/2021 Weight: 262 lbs ?  ?Time in AT/AF  0.0 hr/day (0.0%) (taking Eliquis) ?  ?      Attempted call to patient and unable to reach.  Left detailed message per DPR regarding transmission. Transmission reviewed.  ?  ?      Optivol thoracic impedance suggesting possible fluid accumulation starting 4/1 even after Lasix increase to twice a day on 5/6.  Fluid index > normal threshold starting 4/16 for first time in year.  ?  ?Prescribed:  ?Furosemide 20 mg 1 tablet by mouth twice a day (increased dosage started 5/6). ?Jardiance 10 mg take 1 tablet by mouth daily. ?  ?Labs: ?11/15/2021 Creatinine 1.05, BUN 41, Potassium 5.1, Sodium 139, GFR 59 ?10/10/2021 Creatinine 1.21, BUN 25, Potassium 5.2, Sodium 142 ?A complete set of results can be found in Results Review. ?  ?Recommendations:   Unable to reach.      ?  ?Follow-up plan: ICM clinic phone appointment on 11/25/2021 to recheck fluid level.  91 day device clinic remote transmission 11/14/2021.    ?  ?EP/Cardiology Office Visits:  Recall 11/04/2021 with Tommye Standard, PA.  Recall 08/05/2022 with Dr Ali Lowe. ?  ?Copy of ICM check sent to Dr. Rayann Heman. Will send to Dr Ali Lowe for review if patient is reached. ? ?3 month ICM trend: 11/18/2021. ? ? ? ?12-14 Month ICM trend:  ? ? ? ?[name], RN ?11/18/2021 ?1:31 PM ? ?

## 2021-11-18 NOTE — Telephone Encounter (Signed)
Pt originally left a message stating "if it's more than 1 month send it to [place]".  I called to see what medication she was referrring to and she talked about having "dry eye".  I said that wouldn't be Helene Kelp.  Then she said she wants to talk to Helene Kelp about her feet being swollen. ? ?Next appt 5/22 ?

## 2021-11-19 NOTE — Telephone Encounter (Signed)
Called patient and told her to let the prescribing physician know about the dry eye medication. She had no concerns today about her foot swelling. She did want to verify her next appt with Helene Kelp and it is Monday 5/22. She had originally been scheduled for 5/18, but provider was not available.  ?

## 2021-11-19 NOTE — Progress Notes (Signed)
Spoke with patient and heart failure questions reviewed.  Pt reports swelling in feet and legs. Weight is stable at 262 lbs.  She reports limiting salt and fluid intake.   ? ?She is taking increased Lasix dosage of 20 mg twice a day.   ? ?Copy sent to Dr Ali Lowe for review and recommendations if needed.  ?

## 2021-11-19 NOTE — Progress Notes (Signed)
[name], MD  Abdon Petrosky Panda, RN ?Thanks  ?

## 2021-11-20 ENCOUNTER — Telehealth: Payer: Self-pay | Admitting: *Deleted

## 2021-11-20 DIAGNOSIS — I5032 Chronic diastolic (congestive) heart failure: Secondary | ICD-10-CM

## 2021-11-20 MED ORDER — FUROSEMIDE 40 MG PO TABS: 40.0000 mg | ORAL_TABLET | Freq: Two times a day (BID) | ORAL | 3 refills | Status: DC

## 2021-11-20 NOTE — Telephone Encounter (Signed)
Patient returned my call. ?Reviewed recommendations. ?She will come next week for blood work. ? ?She will monitor swelling and aware after lab work she will receive further recommendations. ?

## 2021-11-20 NOTE — Progress Notes (Signed)
Marie Osmond, MD  Sheikh Leverich Panda, RN; Rodman Key, RN ?Thanks!  ? ?Michalene, let's have her up her dose to '40mg'$  bid with BMP next week.     ? ? ? ?

## 2021-11-20 NOTE — Telephone Encounter (Signed)
Message from Dr. Ali Lowe: -  ?Marie Ramos, let's have her up her dose to '40mg'$  bid with BMP next week.    ? ? ?Per ICM call w patient: ? ?Spoke with patient and heart failure questions reviewed.  Pt reports swelling in feet and legs. Weight is stable at 262 lbs.  She reports limiting salt and fluid intake.   ?  ?She is taking increased Lasix dosage of 20 mg twice a day.   ?  ?Copy sent to Dr Ali Lowe for review and recommendations if needed.   ?  ? ? ? ? ?Left message for patient to call back. ?Sent message to her in [place]. ?

## 2021-11-20 NOTE — Progress Notes (Signed)
Pt will be contacted by Dr Dara Hoyer nurse, Caren Hazy to provide recommendations to increase Lasix to '40mg'$  bid with BMP next week.     ?

## 2021-11-21 ENCOUNTER — Ambulatory Visit: Payer: Medicare HMO | Admitting: Physician Assistant

## 2021-11-21 NOTE — Progress Notes (Signed)
Remote ICD transmission.   

## 2021-11-22 ENCOUNTER — Telehealth: Payer: Self-pay | Admitting: Internal Medicine

## 2021-11-22 NOTE — Telephone Encounter (Signed)
Patient would like to know if it's okay for her Tylenol for a headache.

## 2021-11-22 NOTE — Telephone Encounter (Signed)
Sent mychart message

## 2021-11-22 NOTE — Telephone Encounter (Signed)
Returned call to Pt.  Advised ok to take Tylenol for headache or any body pain.

## 2021-11-25 ENCOUNTER — Telehealth: Payer: Self-pay

## 2021-11-25 ENCOUNTER — Encounter: Payer: Self-pay | Admitting: Physician Assistant

## 2021-11-25 ENCOUNTER — Ambulatory Visit (INDEPENDENT_AMBULATORY_CARE_PROVIDER_SITE_OTHER): Payer: Medicare HMO

## 2021-11-25 ENCOUNTER — Ambulatory Visit (INDEPENDENT_AMBULATORY_CARE_PROVIDER_SITE_OTHER): Payer: Medicare HMO | Admitting: Physician Assistant

## 2021-11-25 DIAGNOSIS — F3110 Bipolar disorder, current episode manic without psychotic features, unspecified: Secondary | ICD-10-CM | POA: Diagnosis not present

## 2021-11-25 DIAGNOSIS — F411 Generalized anxiety disorder: Secondary | ICD-10-CM | POA: Diagnosis not present

## 2021-11-25 DIAGNOSIS — R9431 Abnormal electrocardiogram [ECG] [EKG]: Secondary | ICD-10-CM | POA: Diagnosis not present

## 2021-11-25 DIAGNOSIS — G251 Drug-induced tremor: Secondary | ICD-10-CM

## 2021-11-25 DIAGNOSIS — F5105 Insomnia due to other mental disorder: Secondary | ICD-10-CM

## 2021-11-25 DIAGNOSIS — F99 Mental disorder, not otherwise specified: Secondary | ICD-10-CM

## 2021-11-25 DIAGNOSIS — I5032 Chronic diastolic (congestive) heart failure: Secondary | ICD-10-CM

## 2021-11-25 DIAGNOSIS — Z95810 Presence of automatic (implantable) cardiac defibrillator: Secondary | ICD-10-CM

## 2021-11-25 MED ORDER — OXCARBAZEPINE 300 MG PO TABS: 450.0000 mg | ORAL_TABLET | Freq: Two times a day (BID) | ORAL | 1 refills | Status: DC

## 2021-11-25 MED ORDER — RISPERIDONE 2 MG PO TABS: 2.0000 mg | ORAL_TABLET | Freq: Every day | ORAL | 1 refills | Status: DC

## 2021-11-25 NOTE — Progress Notes (Signed)
[facility]  Patient ID: Marie Ramos,  MRN: [id number]  PCP: Lawerance Cruel, MD  Date of Evaluation: 11/25/2021 Time spent:40 minutes  Chief Complaint:  Chief Complaint   Anxiety; Depression; Insomnia; Follow-up    HISTORY/CURRENT STATUS: For routine med check. Husband [name] is with is with her.   She was last seen 11/06/2021, no med changes at that visit.  She had bilateral lower extremity edema and due to her diagnosis of CHF and history of DVT I recommended she see cardiology.  She saw them on 11/08/2021 and Lasix was started.  The edema has improved since being on the Lasix and follow-up for 9 months planned.  As far as her mental health goes, she is doing well.  Risperdal was started 10/01/2021 for acute mania, was increased 10/18/2021 and has been on the same dose since that visit.  She is not manic now.  She is not as talkative, not starting projects without completing them, and not calling people she has not seen in years.  No impulsive behaviors now.  No grandiosity.  Denies decreased need for sleep, is sleeping pretty well.  She denies racing thoughts or increased spending, no grandiosity, no increased irritability or anger, no paranoia, and no hallucinations.  Is able to enjoy things.  Denies decreased energy.  Denies decreased motivation.  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  ADLs and personal hygiene are normal.  Appetite has not changed.  Weight is stable.  No extreme sadness, tearfulness, or feelings of hopelessness.  Unsure if the memory is better or about the same.  She does take Xanax every night to help her relax to go to sleep, it is helpful.  Not having a lot of anxiety but Xanax helps when needed.  She denies suicidal or homicidal thoughts.  Still complains of a tremor in her hands bilaterally.  Worse in the morning.  This has caused her to drop things, even 2 dinner plates and broke them.  She cannot hold her cell phone  without dropping it.  The tremor does not affect her ability to hold a fork or spoon and eat normally.  She has not tried to write anything.  Denies dizziness, syncope, seizures, numbness, tingling, tics, unsteady gait, slurred speech, confusion.  She does have chronic arthritic pain throughout her body, that is unchanged.  Individual Medical History/ Review of Systems: Changes? :Yes   see HPI.  Past medications for mental health diagnoses include: Lithium caused diarrhea and increased thirst, Trileptal, Effexor XR, Seroquel worked well, Abilify unknown what happened, Adderall, Risperdal  Allergies: Sulfa antibiotics, Albuterol, Other, Sulfamethoxazole-trimethoprim, Vicodin hp [hydrocodone-acetaminophen], Naproxen, and Vicodin [hydrocodone-acetaminophen]  Current Medications:  Current Outpatient Medications:    Acetylcysteine (NAC) 600 MG CAPS, Take 1 capsule (600 mg total) by mouth daily., Disp: 30 capsule, Rfl: 11   ALPRAZolam (XANAX) 0.25 MG tablet, Take 0.5-1 tablets (0.125-0.25 mg total) by mouth 3 (three) times daily as needed for anxiety., Disp: 90 tablet, Rfl: 5   amLODipine (NORVASC) 5 MG tablet, Take 1 tablet (5 mg total) by mouth daily with breakfast., Disp: 30 tablet, Rfl: 0   Ascorbic Acid (VITAMIN C PO), Take 1 capsule by mouth daily., Disp: , Rfl:    aspirin EC 81 MG tablet, Take 1 tablet (81 mg total) by mouth daily. Swallow whole., Disp: 90 tablet, Rfl: 3   atorvastatin (LIPITOR) 20 MG tablet, Take 1 tablet (20 mg total) by mouth daily at 6 PM., Disp:  30 tablet, Rfl: 0   carvedilol (COREG) 12.5 MG tablet, TAKE ONE TABLET BY MOUTH TWICE DAILY WITH BREAKFAST AND DINNER, Disp: 180 tablet, Rfl: 2   famotidine (PEPCID) 20 MG tablet, Take 20 mg by mouth at bedtime., Disp: , Rfl:    fexofenadine (ALLEGRA) 180 MG tablet, Take 180 mg by mouth daily., Disp: , Rfl:    fluocinonide (LIDEX) 0.05 % external solution, as needed., Disp: , Rfl:    fluticasone (FLONASE) 50 MCG/ACT nasal spray,  Place 1 spray into both nostrils daily. Use as directed, Disp: , Rfl:    furosemide (LASIX) 40 MG tablet, Take 1 tablet (40 mg total) by mouth 2 (two) times daily., Disp: 90 tablet, Rfl: 3   Ginkgo Biloba 40 MG TABS, Take 1 tablet (40 mg total) by mouth 2 (two) times daily., Disp: 180 tablet, Rfl: 11   losartan (COZAAR) 100 MG tablet, Take 1 tablet (100 mg total) by mouth daily., Disp: 90 tablet, Rfl: 3   Multiple Vitamin (MULTIVITAMIN WITH MINERALS) TABS tablet, Take 1 tablet by mouth daily., Disp: , Rfl:    Multiple Vitamins-Minerals (OCUVITE PO), Take 1 capsule by mouth daily., Disp: , Rfl:    triamcinolone ointment (KENALOG) 0.5 %, Apply 1 application topically as needed., Disp: , Rfl: 0   venlafaxine XR (EFFEXOR-XR) 150 MG 24 hr capsule, Take 2 capsules (300 mg total) by mouth at bedtime., Disp: 180 capsule, Rfl: 1   empagliflozin (JARDIANCE) 10 MG TABS tablet, Take 1 tablet (10 mg total) by mouth daily. (Patient not taking: Reported on 11/25/2021), Disp: 90 tablet, Rfl: 3   Oxcarbazepine (TRILEPTAL) 300 MG tablet, Take 1.5 tablets (450 mg total) by mouth 2 (two) times daily., Disp: 270 tablet, Rfl: 1   Risankizumab-rzaa (SKYRIZI, 150 MG DOSE, [place]), Inject into the skin every 3 (three) months. (Patient not taking: Reported on 11/25/2021), Disp: , Rfl:    risperiDONE (RISPERDAL) 2 MG tablet, Take 1 tablet (2 mg total) by mouth at bedtime., Disp: 90 tablet, Rfl: 1 Medication Side Effects:  tremor  Family Medical/ Social History: Changes? No  MENTAL HEALTH EXAM:  There were no vitals taken for this visit.There is no height or weight on file to calculate BMI.  General Appearance: Casual, Well Groomed, and Obese  Eye Contact:  Good  Speech:  Clear and Coherent and Normal Rate  Volume:  Normal  Mood:  Euthymic  Affect:  Congruent       Thought Process:  Goal Directed and Descriptions of Associations: Circumstantial  Orientation:  Full (Time, Place, and Person)  Thought Content: Logical    Suicidal Thoughts:  No  Homicidal Thoughts:  No  Memory:  Immediate;   Fair Recent;   Good and Fair Remote;   Good  Judgement:  Good  Insight:  Good  Psychomotor Activity:   Walks slowly, no tremor at rest or with outstretched arms at this time.  Concentration:  Concentration: Good and Attention Span: Good  Recall:  Good  Fund of Knowledge: Good  Language: Good  Assets:  Desire for Improvement Financial Resources/Insurance Housing Social Support Talents/Skills Transportation  ADL's:  Intact  Cognition: WNL  Prognosis:  Good   11/15/2021 BMP glucose 103, BUN 41, creatinine 1.0.  Cardiology has ordered a repeat BMP which has not been done yet.  Cardiology note from 11/08/2021 was reviewed.  DIAGNOSES:    ICD-10-CM   1. Bipolar I disorder, most recent episode (or current) manic ([place])  F31.10     2. Tremor due  to multiple drugs  G25.1     3. Generalized anxiety disorder  F41.1     4. Insomnia due to other mental disorder  F51.05    F99     5. QT prolongation  R94.31       Receiving Psychotherapy: No   RECOMMENDATIONS:  PDMP reviewed. Last Xanax 10/24/2021. I provided 40 minutes of face to face time during this encounter, including time spent before and after the visit in records review, medical decision making, counseling pertinent to today's visit, and charting.  Her tremor needs to be addressed, Artane or Cogentin would be most effective.  But due to polypharmacy, I would like to discuss this with her pharmacist that she will be seeing in 4 days through her cardiology office, before I prescribe either of those drugs.  Patient and her husband understand.  Note given to her to take to the pharmacist stating "is it safe to add either Cogentin or Artane for tremor caused by Risperdal?  Please let me know.  Thanks." She is no longer manic, has been on Risperdal for almost 2 months now with the current dose for 4 to 5 weeks.  Will continue all meds at present doses, and will see  what the pharmacist says about adding Artane or Cogentin.  Continue Xanax 0.25 mg, 1 p.o. nightly as needed sleep or anxiety. Continue Trileptal 300 mg, 1.5 pills p.o. twice daily. Continue Risperdal 2 mg nightly. Continue Effexor XR 150 mg, 2 p.o. daily. Continue NAC, multivitamin, B complex, fish oil, ginkgo biloba. Will discuss lipid panel and hemoglobin A1c at her next visit.   Return in 4 weeks.  Avoid drugs that are known to cause prolonged QT interval  Donnal Moat, PA-C

## 2021-11-25 NOTE — Progress Notes (Unsigned)
EPIC Encounter for ICM Monitoring  Patient Name: Marie Ramos is a 66 y.o. female Date: 11/25/2021 Primary Care Physican: Lawerance Cruel, MD Primary Cardiologist: Ali Lowe Electrophysiologist: Allred 07/17/2021 Weight: 264 lbs 11/11/2021 Weight: 262 lbs 11/25/2021 Weight: 261 lbs   Time in AT/AF  0.0 hr/day (0.0%) (taking Eliquis)         Spoke with patient and heart failure questions reviewed.  Pt reports feet are still swollen but better since taking Furosemide 40 mg bid.  She thinks she is following low salt diet and limiting fluid intake.         Optivol thoracic impedance suggesting possible fluid accumulation continues even after increasing Furosemide to 40 mg bid on 5/17 but did improve for one day on 5/18.    Prescribed:  Furosemide 40 mg  Take 1 tablet (40 mg total) by mouth twice a day (increased dosage started 5/17). Jardiance 10 mg take 1 tablet by mouth daily.   Labs: 11/27/2021 BMET scheduled 11/15/2021 Creatinine 1.05, BUN 41, Potassium 5.1, Sodium 139, GFR 59 10/10/2021 Creatinine 1.21, BUN 25, Potassium 5.2, Sodium 142 A complete set of results can be found in Results Review.   Recommendations:   Sent to Dr Ali Lowe for review and show effectiveness of Lasix change.  Will recheck fluid levels before 5/26 Pharmacy appointment.      Follow-up plan: ICM clinic phone appointment on 11/28/2021 (manual) to recheck fluid level.  91 day device clinic remote transmission 02/13/2022.      EP/Cardiology Office Visits:  Recall 11/04/2021 with Tommye Standard, PA.  11/29/2021 with Pharmacist.  Recall 08/05/2022 with Dr Ali Lowe.   Copy of ICM check sent to Dr. Rayann Heman.      3 month ICM trend: 11/25/2021.    12-14 Month ICM trend:       [name], RN 11/25/2021 11:02 AM

## 2021-11-25 NOTE — Telephone Encounter (Signed)
Remote ICM transmission received.  Attempted call to patient regarding ICM remote transmission and left detailed message per DPR.  Advised to return call.

## 2021-11-26 NOTE — Progress Notes (Unsigned)
Attempted call to patient and unable to reach.    

## 2021-11-26 NOTE — Progress Notes (Unsigned)
[name], MD  Ulices Maack Panda, RN Lets keep with bid lasix for now, thanks.

## 2021-11-27 ENCOUNTER — Other Ambulatory Visit: Payer: Medicare HMO | Admitting: *Deleted

## 2021-11-27 DIAGNOSIS — I5032 Chronic diastolic (congestive) heart failure: Secondary | ICD-10-CM

## 2021-11-27 LAB — BASIC METABOLIC PANEL
BUN/Creatinine Ratio: 25 (ref 12–28)
BUN: 31 mg/dL — ABNORMAL HIGH (ref 8–27)
CO2: 36 mmol/L — ABNORMAL HIGH (ref 20–29)
Calcium: 9.6 mg/dL (ref 8.7–10.3)
Chloride: 95 mmol/L — ABNORMAL LOW (ref 96–106)
Creatinine, Ser: 1.26 mg/dL — ABNORMAL HIGH (ref 0.57–1.00)
Glucose: 80 mg/dL (ref 70–99)
Potassium: 5 mmol/L (ref 3.5–5.2)
Sodium: 142 mmol/L (ref 134–144)
eGFR: 47 mL/min/{1.73_m2} — ABNORMAL LOW (ref >59)

## 2021-11-27 NOTE — Progress Notes (Signed)
Attempted call to patient and unable to reach.  Mail box is full.

## 2021-11-27 NOTE — Progress Notes (Signed)
Spoke with patient and advised Dr Ali Lowe recommended she continue with current Lasix dosage of twice a day.  Pt verbalized understanding.  She will send remote transmission 5/25 in the event pharmacist would like to review for 5/26 appointment

## 2021-11-28 ENCOUNTER — Telehealth: Payer: Self-pay

## 2021-11-28 ENCOUNTER — Ambulatory Visit (INDEPENDENT_AMBULATORY_CARE_PROVIDER_SITE_OTHER): Payer: Medicare HMO

## 2021-11-28 DIAGNOSIS — Z95810 Presence of automatic (implantable) cardiac defibrillator: Secondary | ICD-10-CM

## 2021-11-28 DIAGNOSIS — I5032 Chronic diastolic (congestive) heart failure: Secondary | ICD-10-CM

## 2021-11-28 NOTE — Progress Notes (Unsigned)
Patient ID: Marie Ramos                 DOB: [birth year]                    MRN: [id number]     HPI: Marie Ramos is a 66 y.o. female patient referred to pharmacy clinic by Dr Ali Lowe for weight loss management. PMH is significant for diastolic CHF, cardiac arrest, CVA, HTN, and obesity.  Pt presents today for follow up. Has not started on Jardiance yet for her diastolic CHF. Still has some swelling in her ankles. Has a limited income. 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 Walking is limited by knee pain.  Diet:  -Breakfast: waffles and OJ today, usually has a grain cereal with skim milk -Lunch: sandwich or salad, applesauce or banana -Dinner: protein, vegetable, salad. Doesn't cook with salt -Snacks: granola bars -Drinks: limiting to 62oz per day, mostly water  Exercise: limited walking due to knee pain.  Family History: Mother with cancer, depression and CAD. Father with COPD and depression. Maternal grandmother with DM.   Social History: Denies tobacco, alcohol and drug use.  Labs: Lab Results  Component Value Date   HGBA1C 6.2 (H) 09/03/2015    Wt Readings from Last 1 Encounters:  11/08/21 268 lb (121.6 kg)    BP Readings from Last 1 Encounters:  11/08/21 140/72   Pulse Readings from Last 1 Encounters:  11/08/21 86       Component Value Date/Time   CHOL 225 (H) 09/03/2015 0248   TRIG 128 08/16/2019 1654   HDL 43 09/03/2015 0248   CHOLHDL 5.2 09/03/2015 0248   VLDL 67 (H) 09/03/2015 0248   LDLCALC 115 (H) 09/03/2015 0248    Past Medical History:  Diagnosis Date   Abnormality of gait    [facility]    Acute lower UTI    Acute metabolic encephalopathy 09/04/5398   Acute respiratory failure (HCC)    Acute systolic congestive heart failure (HCC)    AKI (acute kidney injury) ([place])    Anoxic brain damage (HCC)    Bipolar 1 disorder (HCC)    Bipolar  affective disorder in remission ([place])    Cardiac arrest ([place]) 08/24/2015   Cerebral infarction due to embolism of cerebral artery (HCC)    Cerebrovascular accident (CVA) due to embolism of cerebral artery ([place]) 12/05/2015   Chronically dry eyes    Closed fracture of left distal radius 12/01/2018   DVT (deep venous thrombosis) ([place]) 12/05/2015   Dyspnea    r/t seasonal allergies   Hypertrophic obstructive cardiomyopathy ([place]) 08/29/2015   Leukocytosis    Macular degeneration    Pneumonia due to COVID-19 virus 08/16/2019   Pulmonary vascular congestion 08/16/2019   QT prolongation 08/29/2015   Stroke ([place])    Systolic dysfunction with acute on chronic heart failure (HCC)    TBI (traumatic brain injury) ([place]) 2011   Inpatient rehab [facility]   Thrombocytosis    Ventricular fibrillation ([place]) 09/03/15   MDT ICD Dr. Rayann Heman    Current Outpatient Medications on File Prior to Visit  Medication Sig Dispense Refill   Acetylcysteine (NAC) 600 MG CAPS Take 1 capsule (600 mg total) by mouth daily. 30 capsule 11   ALPRAZolam (XANAX) 0.25 MG tablet Take 0.5-1 tablets (0.125-0.25 mg total) by mouth 3 (three) times daily as needed for anxiety. 90 tablet 5   amLODipine (NORVASC)  5 MG tablet Take 1 tablet (5 mg total) by mouth daily with breakfast. 30 tablet 0   Ascorbic Acid (VITAMIN C PO) Take 1 capsule by mouth daily.     aspirin EC 81 MG tablet Take 1 tablet (81 mg total) by mouth daily. Swallow whole. 90 tablet 3   atorvastatin (LIPITOR) 20 MG tablet Take 1 tablet (20 mg total) by mouth daily at 6 PM. 30 tablet 0   carvedilol (COREG) 12.5 MG tablet TAKE ONE TABLET BY MOUTH TWICE DAILY WITH BREAKFAST AND DINNER 180 tablet 2   empagliflozin (JARDIANCE) 10 MG TABS tablet Take 1 tablet (10 mg total) by mouth daily. (Patient not taking: Reported on 11/25/2021) 90 tablet 3   famotidine (PEPCID) 20 MG tablet Take 20 mg by mouth at bedtime.     fexofenadine (ALLEGRA) 180 MG tablet Take 180 mg by mouth daily.      fluocinonide (LIDEX) 0.05 % external solution as needed.     fluticasone (FLONASE) 50 MCG/ACT nasal spray Place 1 spray into both nostrils daily. Use as directed     furosemide (LASIX) 40 MG tablet Take 1 tablet (40 mg total) by mouth 2 (two) times daily. 90 tablet 3   Ginkgo Biloba 40 MG TABS Take 1 tablet (40 mg total) by mouth 2 (two) times daily. 180 tablet 11   losartan (COZAAR) 100 MG tablet Take 1 tablet (100 mg total) by mouth daily. 90 tablet 3   Multiple Vitamin (MULTIVITAMIN WITH MINERALS) TABS tablet Take 1 tablet by mouth daily.     Multiple Vitamins-Minerals (OCUVITE PO) Take 1 capsule by mouth daily.     Oxcarbazepine (TRILEPTAL) 300 MG tablet Take 1.5 tablets (450 mg total) by mouth 2 (two) times daily. 270 tablet 1   Risankizumab-rzaa (SKYRIZI, 150 MG DOSE, [place]) Inject into the skin every 3 (three) months. (Patient not taking: Reported on 11/25/2021)     risperiDONE (RISPERDAL) 2 MG tablet Take 1 tablet (2 mg total) by mouth at bedtime. 90 tablet 1   triamcinolone ointment (KENALOG) 0.5 % Apply 1 application topically as needed.  0   venlafaxine XR (EFFEXOR-XR) 150 MG 24 hr capsule Take 2 capsules (300 mg total) by mouth at bedtime. 180 capsule 1   No current facility-administered medications on file prior to visit.    Allergies  Allergen Reactions   Sulfa Antibiotics Itching    Face neck red   Albuterol Itching   Other Hives   Sulfamethoxazole-Trimethoprim Hives   Vicodin Hp [Hydrocodone-Acetaminophen] Hives   Naproxen Itching   Vicodin [Hydrocodone-Acetaminophen] Itching     Assessment/Plan:  1. Weight loss - BMI currently 50. Physical activity limited by knee pain. Does eat a heart healthy diet. Discussed addition of Ozempic which would be off-label use as pt does not have a dx of DM and her Medicare insurance does not cover branded weight loss medications. Checking updated baseline A1c today as she was prediabetic when this was checked 7 years ago. She is ok with  $45/month anticipated copay for Ozempic. Discussed that if screening A1c is normal and she starts on Ozempic, may have to discontinue in the future if her insurance criteria changes to only cover therapy for diabetic patients.  Advised patient on common side effects including nausea, diarrhea, dyspepsia, decreased appetite, and fatigue. Counseled patient on reducing meal size and how to titrate medication to minimize side effects. Counseled patient to call if intolerable side effects or if experiencing dehydration, abdominal pain, or dizziness. Injection technique reviewed  at today's visit  Titration Plan:  Will plan to follow the titration plan as below, pending patient is tolerating each dose before increasing to the next. Can slow titration if needed for tolerability.    -Month 1: Inject Ozempic 0.'25mg'$  SQ once weekly x 4 weeks -Month 2: Inject Ozempic 0.'5mg'$  SQ once weekly x 6 weeks -Month 3: Inject Ozempic '1mg'$  SQ once weekly x 4 weeks -Month 4+: Inject Ozempic '2mg'$  SQ once weekly   Follow up in 1 month via telephone.  Radiah Lubinski E. Avontae Burkhead, PharmD, BCACP, Secretary [id number], [place], [place] [address] Phone: [phone]; Fax: [phone] 11/29/2021 10:29 AM

## 2021-11-28 NOTE — Progress Notes (Signed)
EPIC Encounter for ICM Monitoring  Patient Name: Marie Ramos is a 66 y.o. female Date: 11/28/2021 Primary Care Physican: Lawerance Cruel, MD Primary Cardiologist: Ali Lowe Electrophysiologist: Allred 07/17/2021 Weight: 264 lbs 11/11/2021 Weight: 262 lbs 11/25/2021 Weight: 261 lbs   Time in AT/AF  0.0 hr/day (0.0%) (taking Eliquis)         Spoke with patient and heart failure questions reviewed.  She is feeling okay.           Optivol thoracic impedance suggesting possible fluid accumulation continues even after increasing Furosemide to 40 mg bid on 5/17 but did improve for one day on 5/18.    Prescribed:  Furosemide 40 mg  Take 1 tablet (40 mg total) by mouth twice a day (increased dosage started 5/17). Jardiance 10 mg take 1 tablet by mouth daily.   Labs: 11/27/2021 Creatinine 1.26, BUN 31, Potassium 5.0, Sodium 142, GFR 47 11/15/2021 Creatinine 1.05, BUN 41, Potassium 5.1, Sodium 139, GFR 59 10/10/2021 Creatinine 1.21, BUN 25, Potassium 5.2, Sodium 142 A complete set of results can be found in Results Review.   Recommendations:  Dr Ali Lowe recommended 5/22 she stay on Furosemide 40 mg bid.  Rechecking fluid levels for levels before 5/26 Pharmacy appointment.      Follow-up plan: ICM clinic phone appointment on 12/04/2021 to recheck fluid levels.  91 day device clinic remote transmission 02/13/2022.      EP/Cardiology Office Visits:  Recall 11/04/2021 with Tommye Standard, PA.  11/29/2021 with Pharmacist.  Recall 08/05/2022 with Dr Ali Lowe.   Copy of ICM check sent to Dr. Rayann Heman.   3 month ICM trend: 11/28/2021.    12-14 Month ICM trend:     [name], RN 11/28/2021 3:12 PM

## 2021-11-28 NOTE — Telephone Encounter (Signed)
Spoke with patient.  Requested to send remote transmission to recheck fluid levels and she will send manual transmission.

## 2021-11-28 NOTE — Telephone Encounter (Signed)
Attempted patient call to request ICM remote transmission for review.  Mail box is full.

## 2021-11-29 ENCOUNTER — Telehealth: Payer: Self-pay

## 2021-11-29 ENCOUNTER — Ambulatory Visit (INDEPENDENT_AMBULATORY_CARE_PROVIDER_SITE_OTHER): Payer: Medicare HMO | Admitting: Pharmacist

## 2021-11-29 ENCOUNTER — Telehealth: Payer: Self-pay | Admitting: Pharmacist

## 2021-11-29 DIAGNOSIS — R7309 Other abnormal glucose: Secondary | ICD-10-CM | POA: Diagnosis not present

## 2021-11-29 DIAGNOSIS — E66813 Obesity, class 3: Secondary | ICD-10-CM

## 2021-11-29 HISTORY — DX: Obesity, class 3: E66.813

## 2021-11-29 LAB — HEMOGLOBIN A1C
Est. average glucose Bld gHb Est-mCnc: 126 mg/dL
Hgb A1c MFr Bld: 6 % — ABNORMAL HIGH (ref 4.8–5.6)

## 2021-11-29 MED ORDER — EMPAGLIFLOZIN 10 MG PO TABS: 10.0000 mg | ORAL_TABLET | Freq: Every day | ORAL | 5 refills | Status: DC

## 2021-11-29 MED ORDER — SEMAGLUTIDE(0.25 OR 0.5MG/DOS) 2 MG/3ML ~~LOC~~ SOPN: PEN_INJECTOR | SUBCUTANEOUS | 1 refills | Status: DC

## 2021-11-29 NOTE — Telephone Encounter (Signed)
Pt states her Ozempic is $350 at the pharmacy. Ozempic is a Tier 3 medication on her formulary which is $45/1 month or $125/3 months, no deductible. This copay is higher than even donut hole pricing would be. Advised pt to contact her insurance to see if they can explain why they aren't covering it for the expected copay amount.

## 2021-11-29 NOTE — Telephone Encounter (Signed)
Patient called back and said after she "wasted two hours" trying to get in touch with her insurance company, all she was told was that even with insurance she would have to pay $245. She can not afford thi medication. She would like to know what she needs to do next. Please advise

## 2021-11-29 NOTE — Telephone Encounter (Signed)
Note sent to [facility] pharmacist at her cardiology office asking about edema as related to Risperdal.  I will await her response before making any recommendations.

## 2021-11-29 NOTE — Telephone Encounter (Signed)
It's hard to say how long she will need the Risperdal. It's highly unlikely the edema is caused by the Risperdal. It's much more likely it's coming from the CHF, being overweight, and sitting a lot. Follow the recommendations of cardiology and I think she'll get better over time.

## 2021-11-29 NOTE — Patient Instructions (Addendum)
Ozempic Counseling Points This medication reduces your appetite and may make you feel fuller longer.  Stop eating when your body tells you that you are full. This will likely happen sooner than you are used to. Store your medication in the fridge until you are ready to use it. Inject your medication in the fatty tissue of your lower abdominal area (2 inches away from belly button) or upper outer thigh. Rotate injection sites. Each pen will last you about 1 month (the first month it will last a few weeks longer). Use a different needle with each weekly injection. Common side effects include: nausea, diarrhea/constipation, and heartburn, and are more likely to occur if you overeat.  Dosing schedule: - Month 1: Inject Ozempic 0.'25mg'$  subcutaneously once weekly for 4 weeks - Month 2: Inject Ozempic 0.'5mg'$  subcutaneously once weekly for 4 weeks - Month 3: Inject Ozempic '1mg'$  subcutaneously once weekly for 4 weeks - Month 4+: Inject Ozempic '2mg'$  subcutaneously once weekly  Tips for living a healthier life     Building a Healthy and Balanced Diet Make most of your meal vegetables and fruits -  of your plate. Aim for color and variety, and remember that potatoes don't count as vegetables on the Healthy Eating Plate because of their negative impact on blood sugar.  Go for whole grains -  of your plate. Whole and intact grains--whole wheat, barley, wheat berries, quinoa, oats, brown rice, and foods made with them, such as whole wheat pasta--have a milder effect on blood sugar and insulin than white bread, white rice, and other refined grains.  Protein power -  of your plate. Fish, poultry, beans, and nuts are all healthy, versatile protein sources--they can be mixed into salads, and pair well with vegetables on a plate. Limit red meat, and avoid processed meats such as bacon and sausage.  Healthy plant oils - in moderation. Choose healthy vegetable oils like olive, canola, soy, corn, sunflower,  peanut, and others, and avoid partially hydrogenated oils, which contain unhealthy trans fats. Remember that low-fat does not mean "healthy."  Drink water, coffee, or tea. Skip sugary drinks, limit milk and dairy products to one to two servings per day, and limit juice to a small glass per day.  Stay active. The red figure running across the [place] is a reminder that staying active is also important in weight control.  The main message of the Healthy Eating Plate is to focus on diet quality:  The type of carbohydrate in the diet is more important than the amount of carbohydrate in the diet, because some sources of carbohydrate--like vegetables (other than potatoes), fruits, whole grains, and beans--are healthier than others. The Healthy Eating Plate also advises consumers to avoid sugary beverages, a major source of calories--usually with little nutritional value--in the American diet. The Healthy Eating Plate encourages consumers to use healthy oils, and it does not set a maximum on the percentage of calories people should get each day from healthy sources of fat. In this way, the Healthy Eating Plate recommends the opposite of the low-fat message promoted for decades by the USDA.  DeskDistributor.no  SUGAR  Sugar is a huge problem in the modern day diet. Sugar is a big contributor to heart disease, diabetes, high triglyceride levels, fatty liver disease and obesity. Sugar is hidden in almost all packaged foods/beverages. Added sugar is extra sugar that is added beyond what is naturally found and has no nutritional benefit for your body. The American Heart Association recommends  limiting added sugars to no more than 25g for women and 36 grams for men per day. There are many names for sugar including maltose, sucrose (names ending in "ose"), high fructose corn syrup, molasses, cane sugar, corn sweetener, raw sugar, syrup,  honey or fruit juice concentrate.   One of the best ways to limit your added sugars is to stop drinking sweetened beverages such as soda, sweet tea, and fruit juice.  There is 65g of added sugars in one 20oz bottle of Coke! That is equal to 7.5 donuts.   Pay attention and read all nutrition facts labels. Below is an examples of a nutrition facts label. The #1 is showing you the total sugars where the # 2 is showing you the added sugars. This one serving has almost the max amount of added sugars per day!     20 oz Soda 65g Sugar = 7.5 Glazed Donuts  16oz Energy  Drink 54g Sugar = 6.5 Glazed Donuts  Large Sweet  Tea 38g Sugar = 4 Glazed Donuts  20oz Sports  Drink 34g Sugar = 3.5 Glazed Donuts  8oz Chocolate Milk 24g Sugar =2.5 Glazed Donuts  8oz Orange  Juice 21g Sugar = 2 Glazed Donuts  1 Juice Box 14g Sugar = 1.5 Glazed Donuts  16oz Water= NO SUGAR!!  EXERCISE  Exercise is good. We've all heard that. In an ideal world, we would all have time and resources to get plenty of it. When you are active, your heart pumps more efficiently and you will feel better.  Multiple studies show that even walking regularly has benefits that include living a longer life. The American Heart Association recommends 150 minutes per week of exercise (30 minutes per day most days of the week). You can do this in any increment you wish. Nine or more 10-minute walks count. So does an hour-long exercise class. Break the time apart into what will work in your life. Some of the best things you can do include walking briskly, jogging, cycling or swimming laps. Not everyone is ready to "exercise." Sometimes we need to start with just getting active. Here are some easy ways to be more active throughout the day:  Take the stairs instead of the elevator  Go for a 10-15 minute walk during your lunch break (find a friend to make it more enjoyable)  When shopping, park at the back of the parking lot  If you  take public transportation, get off one stop early and walk the extra distance  Pace around while making phone calls  Check with your doctor if you aren't sure what your limitations may be. Always remember to drink plenty of water when doing any type of exercise. Don't feel like a failure if you're not getting the 90-150 minutes per week. If you started by being a couch potato, then just a 10-minute walk each day is a huge improvement. Start with little victories and work your way up.   HEALTHY EATING TIPS  When looking to improve your eating habits, whether to lose weight, lower blood pressure or just be healthier, it helps to know what a serving size is.   Grains 1 slice of bread,  bagel,  cup pasta or rice  Vegetables 1 cup fresh or raw vegetables,  cup cooked or canned Fruits 1 piece of medium sized fruit,  cup canned,   Meats/Proteins  cup dried       1 oz meat, 1 egg,  cup cooked beans, nuts or seeds  Dairy        Fats Individual yogurt container, 1 cup (8oz)    1 teaspoon margarine/butter or vegetable  milk or milk alternative, 1 slice of cheese          oil; 1 tablespoon mayonnaise or salad dressing                  Plan ahead: make a menu of the meals for a week then create a grocery list to go with that menu. Consider meals that easily stretch into a night of leftovers, such as stews or casseroles. Or consider making two of your favorite meal and put one in the freezer for another night. Try a night or two each week that is "meatless" or "no cook" such as salads. When you get home from the grocery store wash and prepare your vegetables and fruits. Then when you need them they are ready to go.   Tips for going to the grocery store:  [place] store or generic brands  Check the weekly ad from your store on-line or in their in-store flyer  Look at the unit price on the shelf tag to compare/contrast the costs of different items  Buy fruits/vegetables in season  Carrots, bananas and  apples are low-cost, naturally healthy items  If meats or frozen vegetables are on sale, buy some extras and put in your freezer  Limit buying prepared or "ready to eat" items, even if they are pre-made salads or fruit snacks  Do not shop when you're hungry  Foods at eye level tend to be more expensive. Look on the high and low shelves for deals.  Consider shopping at the farmer's market for fresh foods in season.  Avoid the cookie and chip aisles (these are expensive, high in calories and low in nutritional value). Shop on the outside of the grocery store.  Healthy food preparations:  If you can't get lean hamburger, be sure to drain the fat when cooking  Steam, saut (in olive oil), grill or bake foods  Experiment with different seasonings to avoid adding salt to your foods. Kosher salt, sea salt and Himalayan salt are all still salt and should be avoided. Try seasoning food with onion, garlic, thyme, rosemary, basil ect. Onion powder or garlic powder is ok. Avoid if it says salt (ie garlic salt).

## 2021-12-03 ENCOUNTER — Other Ambulatory Visit: Payer: Self-pay

## 2021-12-03 ENCOUNTER — Telehealth: Payer: Self-pay | Admitting: Internal Medicine

## 2021-12-03 ENCOUNTER — Other Ambulatory Visit: Payer: Self-pay | Admitting: Physician Assistant

## 2021-12-03 MED ORDER — BENZTROPINE MESYLATE 0.5 MG PO TABS: ORAL_TABLET | ORAL | 0 refills | Status: DC

## 2021-12-03 NOTE — Telephone Encounter (Signed)
Patient asks me which medication she should take for tremors. Based on your note you recommend Artane or Cogentin. She asks which did you recommend first. I'm not sure if she was supposed to call the pharmacy to ask about the medications, but she mentions the "C one".  If I need to check with the pharmacy please let me know and I'll call them.

## 2021-12-03 NOTE — Telephone Encounter (Signed)
Patient notified

## 2021-12-03 NOTE — Telephone Encounter (Signed)
Tried calling patient and mailbox is full

## 2021-12-03 NOTE — Telephone Encounter (Addendum)
Do not know why pharmacy is charging her this much for Ozempic. If cost is prohibitive, pt will not be able to start Ozempic. She is using this off label for weight loss, there are no pt assistance options.  Called her pharmacy, they cannot see why copay is high either ($315) and stated pt would need to call her insurance. She already has and they were not helpful. Jardiance fill is too soon until the 3rd so they can't price compare. They stated her Eliquis is $45 and that's also a Tier 3 med. We do not even have this med on her med list.  Insurance says split between normal phase and donut hole phase of coverage. Stated rx is being run as a 42 day supply instead of 30 day which is likely affecting price as well. Copay is $270 when run as 30 day supply.  Advised pt that donut hole is explaining higher cost of Ozempic and we do not have a way to lower this price. She states she will lower the price by talking to her insurance company. Unclear how she plans to do this. Advised her to let us know if she starts on Ozempic.

## 2021-12-03 NOTE — Telephone Encounter (Signed)
Prescription was sent

## 2021-12-03 NOTE — Telephone Encounter (Signed)
Spoke with the patient.  She feels upset that she wont be able to afford to take Ozempic.  We discussed her increased activities and diet and she will keep this up as tolerated.  Wants Dr. Ali Lowe to know she wont be able to start med due to cost.  Pt reports increase in wheezing and swelling which is worse by the end of the day.  She tries to increase legs while sitting.  Weighs but not daily.  Will start daily weights.  No abd swelling or tightness.  Continues lasix 40 mg BID.  Does not add salt while cooking.  Will monitor other foods/drinks/sodium labels.  Is aware I will forward to Dr. Ali Lowe and will call her if any changes are recommended in medications.

## 2021-12-03 NOTE — Telephone Encounter (Signed)
Marie Ramos called today at 11:15 wanting to discuss further the risperidone.  Please call.

## 2021-12-03 NOTE — Telephone Encounter (Signed)
Send to [place] please.

## 2021-12-03 NOTE — Telephone Encounter (Signed)
I'll send in Cogentin. Please confirm pharmacy. Thanks.

## 2021-12-03 NOTE — Telephone Encounter (Signed)
Pt c/o medication issue:  1. Name of Medication: Ozempic furosemide (LASIX) 40 MG tablet  2. How are you currently taking this medication (dosage and times per day)?   3. Are you having a reaction (difficulty breathing--STAT)?   4. What is your medication issue? Pt says the cost of this medication is not what was told to her in office. States she was told $45 and the pharmacist is saying different and would like clarification on this.   She also states that she is experiencing wheezing when she walks and want to know if this is a result of the lasix. Please advise.

## 2021-12-04 ENCOUNTER — Ambulatory Visit (INDEPENDENT_AMBULATORY_CARE_PROVIDER_SITE_OTHER): Payer: Medicare HMO

## 2021-12-04 ENCOUNTER — Telehealth: Payer: Self-pay | Admitting: Internal Medicine

## 2021-12-04 DIAGNOSIS — Z95810 Presence of automatic (implantable) cardiac defibrillator: Secondary | ICD-10-CM

## 2021-12-04 DIAGNOSIS — I5032 Chronic diastolic (congestive) heart failure: Secondary | ICD-10-CM

## 2021-12-04 NOTE — Telephone Encounter (Signed)
Spoke w the patient.  She spoke w ICM nurse and is pleased to hear fluid levels appear to be improving.  She is aware that per Dr. Ali Lowe she should continue on same medications.    No further concerns about her teeth/mouth bleeding.  Her food was too hot last night and burned her gum.    Pt aware that her follow up is due in Jan.  Will call if any needs arise sooner.

## 2021-12-04 NOTE — Telephone Encounter (Signed)
Patient called stating last night she ate food that was very hot in temperature. She can't get her two front teeth to stop bleeding.  She stated she is not on blood thinners. She wants to know what she should do.

## 2021-12-04 NOTE — Progress Notes (Signed)
EPIC Encounter for ICM Monitoring  Patient Name: Marie Ramos is a 66 y.o. female Date: 12/04/2021 Primary Care Physican: Lawerance Cruel, MD Primary Cardiologist: Ali Lowe Electrophysiologist: Allred 07/17/2021 Weight: 264 lbs 11/11/2021 Weight: 262 lbs 11/25/2021 Weight: 261 lbs   Time in AT/AF  0.0 hr/day (0.0%) (taking Eliquis)         Transmission reviewed.         Optivol thoracic impedance suggesting fluid levels improving    Prescribed:  Furosemide 40 mg  Take 1 tablet (40 mg total) by mouth twice a day (increased dosage started 5/17). Jardiance 10 mg take 1 tablet by mouth daily.   Labs: 11/27/2021 Creatinine 1.26, BUN 31, Potassium 5.0, Sodium 142, GFR 47 11/15/2021 Creatinine 1.05, BUN 41, Potassium 5.1, Sodium 139, GFR 59 10/10/2021 Creatinine 1.21, BUN 25, Potassium 5.2, Sodium 142 A complete set of results can be found in Results Review.   Recommendations:  No changes.     Follow-up plan: ICM clinic phone appointment on 12/16/2021.  91 day device clinic remote transmission 02/13/2022.      EP/Cardiology Office Visits:  Recall 11/04/2021 with Tommye Standard, PA.  11/29/2021 with Pharmacist.  Recall 08/05/2022 with Dr Ali Lowe.   Copy of ICM check sent to Dr. Rayann Heman.   3 month ICM trend: 12/04/2021.    12-14 Month ICM trend:     [name], RN 12/04/2021 1:15 PM

## 2021-12-09 ENCOUNTER — Telehealth: Payer: Self-pay

## 2021-12-09 ENCOUNTER — Telehealth: Payer: Self-pay | Admitting: Physician Assistant

## 2021-12-09 MED ORDER — OXCARBAZEPINE 300 MG PO TABS: 450.0000 mg | ORAL_TABLET | Freq: Two times a day (BID) | ORAL | 0 refills | Status: DC

## 2021-12-09 NOTE — Telephone Encounter (Signed)
Pt called at 9:17 am and said that the medicine teresa put her on for her shaking hands is not working. She would like to talk to someone about what to do. Please call [phone]

## 2021-12-09 NOTE — Addendum Note (Signed)
Addended by: Bonney Leitz T on: 12/09/2021 04:47 PM   Modules accepted: Orders

## 2021-12-09 NOTE — Telephone Encounter (Signed)
Patient called back and I spoke with patient and her husband. Husband said they cannot afford a 90-day supply of oxcarbazepine and want a Rx for 30-day supply sent to [facility]. Will send.

## 2021-12-09 NOTE — Telephone Encounter (Signed)
A RX had been sent to [facility] recently. I called [facility] and the order was received on 5/27. Patient had requested to use [facility]. Called patient back to discuss the [facility] order. She said they sent 3 big bottles. I told her she got a 90-day supply and she should have gotten 3 bottles. She wants to send it back to [facility] and then wants me to send another RX to [facility], as well as a 30-day Rx to [facility]. She is concerned about cost. Her husband is at work and she will discuss with him and let me know what she wants to do.   Patient is also asking if she could take a 9-hour drive to [place] or a 3-hour plane trip. I told her that was a discussion to have with her cardiologist, as she is on Eliquis.

## 2021-12-09 NOTE — Telephone Encounter (Signed)
Patient said she isn't going to take Cogentin or anything else for her hand tremors. She said she had difficulty breathing with the Cogentin and had to sit up in a chair in order to breathe. She said she didn't want to try anything else.

## 2021-12-09 NOTE — Telephone Encounter (Signed)
Noted  

## 2021-12-09 NOTE — Telephone Encounter (Signed)
Thank you, Marie Ramos. You're right, Cardiology should make that call.  From a mental health standpoint she is okay to go.  No need to call her back, this is just for the record.

## 2021-12-09 NOTE — Telephone Encounter (Signed)
Marie Ramos called to report a rash on both ankle after increasing Lasix.    Several attempts have been made to reach out to [name] but the voicemail is full. The Lasix is prescribed by Dr. Ali Lowe not here  by [facility]. [name] will need contact her PCP or Dr. Ali Lowe.

## 2021-12-09 NOTE — Telephone Encounter (Signed)
Pt called back asking for refill of Oxcarbazepine to [facility].  She wants a 30 day script then she wants to try to get it from [facility] order.  Next appt 6/23

## 2021-12-10 ENCOUNTER — Telehealth: Payer: Self-pay | Admitting: Physician Assistant

## 2021-12-10 NOTE — Telephone Encounter (Signed)
If she's having trouble breathing, tongue swelling, difficulty swallowing, go to ER.  Remind her that the Risperdal is NOT likely the cause of the swelling, this has been discussed numerous times with her and I've disc w/ her cardiology team. She has CHF and obesity, both can cause or worsen edema.  I do not recommend going off it, b/c of her heart disease she doesn't have many medication options for the bipolar disorder.  But she is adamant about stopping it and now reporting a rash so needs to wean off, have her take Risperdal 1 mg p.o. nightly for 5 days, then 0.5 mg nightly for 5 days and then stop.  If she needs a prescription sent in let me know. In the meantime have her take Zyrtec over-the-counter, have her check with the pharmacist to make sure it is okay with her diagnoses and look for drug interactions. Thank you.

## 2021-12-10 NOTE — Telephone Encounter (Signed)
Pt Lvm  at 9:04a requesting a call back.  She called back again at 9:11a to advise that she doesn't want to take Risperdal anymore.  She said there are too many side effects.  Pls call her back.  Next appt 6/23

## 2021-12-10 NOTE — Telephone Encounter (Signed)
Pt stated she has been on Risperdal for 2 weeks.She has developed a rash on her leg and also has swelling in her legs and feet.She would like to stop med

## 2021-12-11 NOTE — Telephone Encounter (Signed)
I called her back.  States she already stopped the Risperdal, last dose was 2 days ago.  Reports that the mania is gone.  Her husband looked up the side effects of Risperdal and she decided she is not going to take it anymore and did not wean off.  We discussed the possible withdrawals that may occur with abrupt cessation including recurrence of mania.  States she is willing to take that risk.  She still feels like the Risperdal has caused the lower extremity edema even after this has been discussed several times that it is unlikely.   Denies shortness of breath, trouble swallowing, edema of the tongue, but does have a rash which she mentioned in the above messages.  She tries to describe the rash she has but it is difficult to know what she is talking about.  It sounds like a macular discoloration where the edema is present on her feet and ankles.  Although I cannot be sure because I am not looking at it.  She knows to go to the ER if she starts having trouble breathing.  See her PCP if the rash lingers.

## 2021-12-11 NOTE — Telephone Encounter (Signed)
I tried to give her info and she stated she wants to speak to you directly

## 2021-12-13 ENCOUNTER — Other Ambulatory Visit: Payer: Self-pay

## 2021-12-13 ENCOUNTER — Telehealth: Payer: Self-pay | Admitting: Physician Assistant

## 2021-12-13 MED ORDER — OXCARBAZEPINE 300 MG PO TABS: 450.0000 mg | ORAL_TABLET | Freq: Two times a day (BID) | ORAL | 0 refills | Status: DC

## 2021-12-13 NOTE — Telephone Encounter (Signed)
Rx resent.

## 2021-12-13 NOTE — Telephone Encounter (Signed)
Patient called in for refill on Oxcarbazepine '300mg'$ . States she spoke with pharmacy before calling [facility] and was told they hadn't received anything. Patient is down to last few people and would for prescription to be sent over ASAP. Appt 6/23 Ph: [phone] [place]

## 2021-12-16 ENCOUNTER — Ambulatory Visit (INDEPENDENT_AMBULATORY_CARE_PROVIDER_SITE_OTHER): Payer: Medicare HMO

## 2021-12-16 DIAGNOSIS — Z95810 Presence of automatic (implantable) cardiac defibrillator: Secondary | ICD-10-CM

## 2021-12-16 DIAGNOSIS — I5032 Chronic diastolic (congestive) heart failure: Secondary | ICD-10-CM | POA: Diagnosis not present

## 2021-12-18 NOTE — Progress Notes (Signed)
EPIC Encounter for ICM Monitoring  Patient Name: Marie Ramos is a 66 y.o. female Date: 12/18/2021 Primary Care Physican: Lawerance Cruel, MD Primary Cardiologist: Ali Lowe Electrophysiologist: Allred 11/25/2021 Weight: 261 lbs 12/18/2021 Weight: 261 lbs   Time in AT/AF  0.0 hr/day (0.0%) (taking Eliquis)         Spoke with patient and heart failure questions reviewed.  Pt reports swelling of lower extremities have improved and she is feeling better. Good urine output with Furosemide dosage.          Optivol thoracic impedance suggesting fluid levels returned to normal.  Fluid index returned to normal threshold.   Prescribed:  Furosemide 40 mg  Take 1 tablet (40 mg total) by mouth twice a day (increased dosage started 5/17). Jardiance 10 mg take 1 tablet by mouth daily.   Labs: 11/27/2021 Creatinine 1.26, BUN 31, Potassium 5.0, Sodium 142, GFR 47 11/15/2021 Creatinine 1.05, BUN 41, Potassium 5.1, Sodium 139, GFR 59 10/10/2021 Creatinine 1.21, BUN 25, Potassium 5.2, Sodium 142 A complete set of results can be found in Results Review.   Recommendations:  No changes and encouraged to call if experiencing any fluid symptoms.   Follow-up plan: ICM clinic phone appointment on 01/20/2022.  91 day device clinic remote transmission 02/13/2022.      EP/Cardiology Office Visits:  Recall 11/04/2021 with Tommye Standard, PA.  Recall 08/05/2022 with Dr Ali Lowe.   Copy of ICM check sent to Dr. Rayann Heman.    3 month ICM trend: 12/16/2021.    12-14 Month ICM trend:     [name], RN 12/18/2021 10:14 AM

## 2021-12-26 ENCOUNTER — Telehealth: Payer: Self-pay | Admitting: Physician Assistant

## 2021-12-26 NOTE — Telephone Encounter (Signed)
Marie Ramos lvm requesting a refill on the Alprazolam. She is aware of her appointment on 6/23, but is in need of the medication today.  Fill at [facility] # [address], [place]  [address] [name] [address]  Phone:  [phone]  Fax:  [phone]

## 2021-12-27 ENCOUNTER — Telehealth: Payer: Self-pay | Admitting: Internal Medicine

## 2021-12-27 ENCOUNTER — Other Ambulatory Visit: Payer: Self-pay

## 2021-12-27 ENCOUNTER — Encounter: Payer: Self-pay | Admitting: Physician Assistant

## 2021-12-27 ENCOUNTER — Ambulatory Visit (INDEPENDENT_AMBULATORY_CARE_PROVIDER_SITE_OTHER): Payer: Medicare HMO | Admitting: Physician Assistant

## 2021-12-27 DIAGNOSIS — F411 Generalized anxiety disorder: Secondary | ICD-10-CM

## 2021-12-27 DIAGNOSIS — R9431 Abnormal electrocardiogram [ECG] [EKG]: Secondary | ICD-10-CM | POA: Diagnosis not present

## 2021-12-27 DIAGNOSIS — F3110 Bipolar disorder, current episode manic without psychotic features, unspecified: Secondary | ICD-10-CM | POA: Diagnosis not present

## 2021-12-27 MED ORDER — ALPRAZOLAM 0.25 MG PO TABS: 0.1250 mg | ORAL_TABLET | Freq: Three times a day (TID) | ORAL | 0 refills | Status: DC | PRN

## 2021-12-27 NOTE — Telephone Encounter (Signed)
Patient wanted to know how long she has to keep taking Lasix 40 mg BID.  She had ICM nurse interrogation on 12/16/21:     Spoke with patient and heart failure questions reviewed.  Pt reports swelling of lower extremities have improved and she is feeling better. Good urine output with Furosemide dosage.          Optivol thoracic impedance suggesting fluid levels returned to normal.  Fluid index returned to normal threshold.   Prescribed:  Furosemide 40 mg  Take 1 tablet (40 mg total) by mouth twice a day (increased dosage started 5/17). Jardiance 10 mg take 1 tablet by mouth daily.   The patient understands to continue Lasix 40 mg BID and after her next ICM check we can check w Dr. Lynnette Caffey about possibly lowering the dose of Lasix depending on those results.

## 2021-12-30 ENCOUNTER — Telehealth: Payer: Self-pay | Admitting: Internal Medicine

## 2021-12-30 MED ORDER — FUROSEMIDE 40 MG PO TABS
40.0000 mg | ORAL_TABLET | Freq: Every day | ORAL | 3 refills | Status: DC
Start: 2021-12-30 — End: 2022-12-01

## 2022-01-20 ENCOUNTER — Ambulatory Visit (INDEPENDENT_AMBULATORY_CARE_PROVIDER_SITE_OTHER): Payer: Medicare HMO

## 2022-01-20 DIAGNOSIS — Z95810 Presence of automatic (implantable) cardiac defibrillator: Secondary | ICD-10-CM | POA: Diagnosis not present

## 2022-01-20 DIAGNOSIS — I5032 Chronic diastolic (congestive) heart failure: Secondary | ICD-10-CM

## 2022-01-24 NOTE — Progress Notes (Signed)
EPIC Encounter for ICM Monitoring  Patient Name: Marie Ramos is a 66 y.o. female Date: 01/24/2022 Primary Care Physican: Lawerance Cruel, MD Primary Cardiologist: Ali Lowe Electrophysiologist: Allred 11/25/2021 Weight: 261 lbs 12/18/2021 Weight: 261 lbs   Time in AT/AF  0.0 hr/day (0.0%) (taking Eliquis)         Spoke with patient and heart failure questions reviewed.  Pt reports she is doing well and has no complaints.          Optivol thoracic impedance suggesting normal fluid levels.   Prescribed:  Furosemide 40 mg  Take 1 tablet (40 mg total) by mouth daily. Jardiance 10 mg take 1 tablet by mouth daily.   Labs: 11/27/2021 Creatinine 1.26, BUN 31, Potassium 5.0, Sodium 142, GFR 47 11/15/2021 Creatinine 1.05, BUN 41, Potassium 5.1, Sodium 139, GFR 59 10/10/2021 Creatinine 1.21, BUN 25, Potassium 5.2, Sodium 142 A complete set of results can be found in Results Review.   Recommendations:  No changes and encouraged to call if experiencing any fluid symptoms.   Follow-up plan: ICM clinic phone appointment on 02/24/2022.  91 day device clinic remote transmission 02/13/2022.      EP/Cardiology Office Visits:  Recall 11/04/2021 with Tommye Standard, PA.  Recall 08/05/2022 with Dr Ali Lowe.   Copy of ICM check sent to Dr. Rayann Heman.    3 month ICM trend: 01/20/2022.    12-14 Month ICM trend:     [name], RN 01/24/2022 10:41 AM

## 2022-01-31 ENCOUNTER — Encounter: Payer: Self-pay | Admitting: Physician Assistant

## 2022-01-31 ENCOUNTER — Ambulatory Visit (INDEPENDENT_AMBULATORY_CARE_PROVIDER_SITE_OTHER): Payer: Medicare HMO | Admitting: Physician Assistant

## 2022-01-31 DIAGNOSIS — F411 Generalized anxiety disorder: Secondary | ICD-10-CM | POA: Diagnosis not present

## 2022-01-31 DIAGNOSIS — F99 Mental disorder, not otherwise specified: Secondary | ICD-10-CM | POA: Diagnosis not present

## 2022-01-31 DIAGNOSIS — F5105 Insomnia due to other mental disorder: Secondary | ICD-10-CM

## 2022-01-31 DIAGNOSIS — F3341 Major depressive disorder, recurrent, in partial remission: Secondary | ICD-10-CM

## 2022-01-31 DIAGNOSIS — F319 Bipolar disorder, unspecified: Secondary | ICD-10-CM | POA: Diagnosis not present

## 2022-01-31 DIAGNOSIS — R9431 Abnormal electrocardiogram [ECG] [EKG]: Secondary | ICD-10-CM

## 2022-01-31 MED ORDER — ALPRAZOLAM 0.25 MG PO TABS: 0.1250 mg | ORAL_TABLET | Freq: Three times a day (TID) | ORAL | 1 refills | Status: DC | PRN

## 2022-01-31 MED ORDER — OXCARBAZEPINE 300 MG PO TABS: 450.0000 mg | ORAL_TABLET | Freq: Two times a day (BID) | ORAL | 3 refills | Status: DC

## 2022-01-31 NOTE — Progress Notes (Signed)
[facility]  Patient ID: Marie Ramos,  MRN: [id number]  PCP: Lawerance Cruel, MD  Date of Evaluation: 01/31/2022 Time spent:20 minutes  Chief Complaint:  Chief Complaint   Anxiety; Depression; Follow-up     HISTORY/CURRENT STATUS: For routine med check.  Doing really well, not on Risperdal for about 6 weeks. No manic sx at all.  No increased energy with decreased need for sleep, no increased talkativeness, no racing thoughts, no impulsivity or risky behaviors, no increased spending, no increased libido, no grandiosity, no increased irritability or anger, no paranoia, and no hallucinations.  Since going off the Risperdal she has had no further swelling of her legs.  She has been swimming some for exercise and has enjoyed that.  She is eating pretty well.  Is still overweight, but states she is working on it.  Patient denies loss of interest in usual activities and is able to enjoy things.  Denies decreased energy.  Denies decreased motivation.  ADLs and personal hygiene are normal.  Appetite has not changed.  Weight is stable.  No extreme sadness, tearfulness, or feelings of hopelessness.  Sleeps well most of the time but has to take the Xanax in order to do so.  Denies any changes in concentration, making decisions or remembering things.  Not having anxiety very often.  Usually only takes the Xanax at night to help her sleep.  Denies suicidal or homicidal thoughts.  Denies dizziness, syncope, seizures, numbness, tingling, tremor, tics, unsteady gait, slurred speech, confusion.  Chronic multijoint arthritis and discomfort from that.  Unchanged.  No abnormal muscle movements.  No TD.  Individual Medical History/ Review of Systems: Changes? :No       Past medications for mental health diagnoses include: Lithium caused diarrhea and increased thirst, Trileptal, Effexor XR, Seroquel worked well, Abilify unknown what happened, Adderall, Risperdal helped mania but caused  bilateral lower extremity edema and dry mouth.  Allergies: Sulfa antibiotics, Albuterol, Other, Sulfamethoxazole-trimethoprim, Vicodin hp [hydrocodone-acetaminophen], Naproxen, and Vicodin [hydrocodone-acetaminophen]  Current Medications:  Current Outpatient Medications:    Acetylcysteine (NAC) 600 MG CAPS, Take 1 capsule (600 mg total) by mouth daily., Disp: 30 capsule, Rfl: 11   amLODipine (NORVASC) 5 MG tablet, Take 1 tablet (5 mg total) by mouth daily with breakfast., Disp: 30 tablet, Rfl: 0   Ascorbic Acid (VITAMIN C PO), Take 1 capsule by mouth daily., Disp: , Rfl:    aspirin EC 81 MG tablet, Take 1 tablet (81 mg total) by mouth daily. Swallow whole., Disp: 90 tablet, Rfl: 3   atorvastatin (LIPITOR) 20 MG tablet, Take 1 tablet (20 mg total) by mouth daily at 6 PM., Disp: 30 tablet, Rfl: 0   carvedilol (COREG) 12.5 MG tablet, TAKE ONE TABLET BY MOUTH TWICE DAILY WITH BREAKFAST AND DINNER, Disp: 180 tablet, Rfl: 2   empagliflozin (JARDIANCE) 10 MG TABS tablet, Take 1 tablet (10 mg total) by mouth daily., Disp: 30 tablet, Rfl: 5   famotidine (PEPCID) 20 MG tablet, Take 20 mg by mouth at bedtime., Disp: , Rfl:    fexofenadine (ALLEGRA) 180 MG tablet, Take 180 mg by mouth daily., Disp: , Rfl:    fluocinonide (LIDEX) 0.05 % external solution, as needed., Disp: , Rfl:    fluticasone (FLONASE) 50 MCG/ACT nasal spray, Place 1 spray into both nostrils daily. Use as directed, Disp: , Rfl:    furosemide (LASIX) 40 MG tablet, Take 1 tablet (40 mg total) by mouth daily., Disp: 90 tablet, Rfl: 3   Ginkgo  Biloba 40 MG TABS, Take 1 tablet (40 mg total) by mouth 2 (two) times daily., Disp: 180 tablet, Rfl: 11   losartan (COZAAR) 100 MG tablet, Take 1 tablet (100 mg total) by mouth daily., Disp: 90 tablet, Rfl: 3   Multiple Vitamin (MULTIVITAMIN WITH MINERALS) TABS tablet, Take 1 tablet by mouth daily., Disp: , Rfl:    Multiple Vitamins-Minerals (OCUVITE PO), Take 1 capsule by mouth daily., Disp: , Rfl:     triamcinolone ointment (KENALOG) 0.5 %, Apply 1 application topically as needed., Disp: , Rfl: 0   venlafaxine XR (EFFEXOR-XR) 150 MG 24 hr capsule, Take 2 capsules (300 mg total) by mouth at bedtime., Disp: 180 capsule, Rfl: 1   ALPRAZolam (XANAX) 0.25 MG tablet, Take 0.5-1 tablets (0.125-0.25 mg total) by mouth 3 (three) times daily as needed for anxiety., Disp: 90 tablet, Rfl: 1   Oxcarbazepine (TRILEPTAL) 300 MG tablet, Take 1.5 tablets (450 mg total) by mouth 2 (two) times daily., Disp: 90 tablet, Rfl: 3   Risankizumab-rzaa (SKYRIZI, 150 MG DOSE, [place]), Inject into the skin every 3 (three) months. (Patient not taking: Reported on 11/25/2021), Disp: , Rfl:    Semaglutide,0.25 or 0.'5MG'$ /DOS, 2 MG/3ML SOPN, Inject 0.'25mg'$  subcutaneously once weekly for 4 weeks, then increase to 0.'5mg'$  subcutaneously (Patient not taking: Reported on 01/31/2022), Disp: 3 mL, Rfl: 1 Medication Side Effects: none  Family Medical/ Social History: Changes? No  MENTAL HEALTH EXAM:  There were no vitals taken for this visit.There is no height or weight on file to calculate BMI.  General Appearance: Casual, Well Groomed, and Obese  Eye Contact:  Good  Speech:  Clear and Coherent and Normal Rate  Volume:  Normal  Mood:  Euthymic  Affect:  Congruent       Thought Process:  Goal Directed and Descriptions of Associations: Circumstantial  Orientation:  Full (Time, Place, and Person)  Thought Content: Logical   Suicidal Thoughts:  No  Homicidal Thoughts:  No  Memory:  Immediate;   Good and Fair Recent;   Good Remote;   Good  Judgement:  Good  Insight:  Good  Psychomotor Activity:  Normal  Concentration:  Concentration: Good and Attention Span: Good  Recall:  Good  Fund of Knowledge: Good  Language: Good  Assets:  Desire for Improvement Financial Resources/Insurance Housing Social Support Transportation  ADL's:  Intact  Cognition: WNL  Prognosis:  Good   DIAGNOSES:    ICD-10-CM   1. Bipolar I disorder ([place])   F31.9     2. Generalized anxiety disorder  F41.1     3. Insomnia due to other mental disorder  F51.05    F99     4. QT prolongation  R94.31     5. Recurrent major depression in partial remission ([place])  F33.41       Receiving Psychotherapy: No   RECOMMENDATIONS:  PDMP reviewed. Last Xanax 12/27/2021. I provided 20 minutes of face to face time during this encounter, including time spent before and after the visit in records review, medical decision making, counseling pertinent to today's visit, and charting.   Continue Xanax 0.25 mg, 1 p.o. nightly as needed sleep or anxiety. Continue Trileptal 300 mg, 1.5 pills p.o. twice daily. Continue Effexor XR 150 mg, 2 p.o. daily. Continue NAC, multivitamin, B complex, fish oil, ginkgo biloba. Return in 3 months.  Avoid drugs that are known to cause prolonged QT interval  Donnal Moat, PA-C

## 2022-02-13 ENCOUNTER — Ambulatory Visit (INDEPENDENT_AMBULATORY_CARE_PROVIDER_SITE_OTHER): Payer: Medicare HMO

## 2022-02-13 DIAGNOSIS — I469 Cardiac arrest, cause unspecified: Secondary | ICD-10-CM

## 2022-02-13 LAB — CUP PACEART REMOTE DEVICE CHECK
Battery Remaining Longevity: 32 mo
Battery Voltage: 2.95 V
Brady Statistic AP VP Percent: 0 %
Brady Statistic AP VS Percent: 0.02 %
Brady Statistic AS VP Percent: 0.04 %
Brady Statistic AS VS Percent: 99.94 %
Brady Statistic RA Percent Paced: 0.02 %
Brady Statistic RV Percent Paced: 0.05 %
Date Time Interrogation Session: 20230810043822
HighPow Impedance: 101 Ohm
Implantable Lead Implant Date: 20170227
Implantable Lead Implant Date: 20170227
Implantable Lead Location: 753859
Implantable Lead Location: 753860
Implantable Lead Model: 5076
Implantable Pulse Generator Implant Date: 20170227
Lead Channel Impedance Value: 399 Ohm
Lead Channel Impedance Value: 456 Ohm
Lead Channel Impedance Value: 475 Ohm
Lead Channel Pacing Threshold Amplitude: 0.5 V
Lead Channel Pacing Threshold Amplitude: 0.625 V
Lead Channel Pacing Threshold Pulse Width: 0.4 ms
Lead Channel Pacing Threshold Pulse Width: 0.4 ms
Lead Channel Sensing Intrinsic Amplitude: 25.125 mV
Lead Channel Sensing Intrinsic Amplitude: 25.125 mV
Lead Channel Sensing Intrinsic Amplitude: 3.375 mV
Lead Channel Sensing Intrinsic Amplitude: 3.375 mV
Lead Channel Setting Pacing Amplitude: 2 V
Lead Channel Setting Pacing Amplitude: 2.5 V
Lead Channel Setting Pacing Pulse Width: 0.4 ms
Lead Channel Setting Sensing Sensitivity: 0.3 mV

## 2022-02-19 DIAGNOSIS — H16223 Keratoconjunctivitis sicca, not specified as Sjogren's, bilateral: Secondary | ICD-10-CM | POA: Diagnosis not present

## 2022-02-24 ENCOUNTER — Telehealth: Payer: Self-pay | Admitting: Internal Medicine

## 2022-02-24 ENCOUNTER — Ambulatory Visit (INDEPENDENT_AMBULATORY_CARE_PROVIDER_SITE_OTHER): Payer: Medicare HMO

## 2022-02-24 DIAGNOSIS — Z95810 Presence of automatic (implantable) cardiac defibrillator: Secondary | ICD-10-CM | POA: Diagnosis not present

## 2022-02-24 DIAGNOSIS — I5032 Chronic diastolic (congestive) heart failure: Secondary | ICD-10-CM

## 2022-02-24 NOTE — Telephone Encounter (Signed)
Pt called asking if she can come pick up another form for handicap. She states she got her placards already but she lost her form

## 2022-02-24 NOTE — Telephone Encounter (Signed)
The patient states she misplaced her application for handicap placard.  I let her know when Dr. Ali Lowe returns to the office later this week we will complete another form and place it in the mail to her.  She is grateful for assistance.

## 2022-02-26 NOTE — Progress Notes (Signed)
EPIC Encounter for ICM Monitoring  Patient Name: Marie Ramos is a 66 y.o. female Date: 02/26/2022 Primary Care Physican: Lawerance Cruel, MD Primary Cardiologist: Ali Lowe Electrophysiologist: Allred 11/25/2021 Weight: 261 lbs 12/18/2021 Weight: 261 lbs   Time in AT/AF  0.0 hr/day (0.0%) (taking Eliquis)         Spoke with patient and heart failure questions reviewed.  Pt reports she is doing well and has no complaints.   Discussed limiting salt intake.  She will be traveling to [place] in September.          Optivol thoracic impedance suggesting possible fluid accumulation starting 8/11 but trending close to baseline.   Prescribed:  Furosemide 40 mg Take 1 tablet (40 mg total) by mouth daily. Jardiance 10 mg take 1 tablet by mouth daily.   Labs: 11/27/2021 Creatinine 1.26, BUN 31, Potassium 5.0, Sodium 142, GFR 47 11/15/2021 Creatinine 1.05, BUN 41, Potassium 5.1, Sodium 139, GFR 59 10/10/2021 Creatinine 1.21, BUN 25, Potassium 5.2, Sodium 142 A complete set of results can be found in Results Review.   Recommendations:  Recommendation to limit salt intake to 2000 mg daily and fluid intake to 64 oz daily.  Encouraged to call if experiencing any fluid symptoms.    Follow-up plan: ICM clinic phone appointment on 03/03/2022 to recheck fluid levels.  91 day device clinic remote transmission 05/15/2022.      EP/Cardiology Office Visits:  Recall 11/04/2021 with Tommye Standard, PA.  Recall 08/05/2022 with Dr Ali Lowe.   Copy of ICM check sent to Dr. Rayann Heman.   3 month ICM trend: 02/24/2022.    12-14 Month ICM trend:     [name], RN 02/26/2022 1:35 PM

## 2022-02-28 NOTE — Telephone Encounter (Signed)
Handicap form signed by Dr. Ali Lowe and placed into outgoing mail.

## 2022-03-03 ENCOUNTER — Ambulatory Visit (INDEPENDENT_AMBULATORY_CARE_PROVIDER_SITE_OTHER): Payer: Medicare HMO

## 2022-03-03 DIAGNOSIS — I5032 Chronic diastolic (congestive) heart failure: Secondary | ICD-10-CM

## 2022-03-03 DIAGNOSIS — Z95810 Presence of automatic (implantable) cardiac defibrillator: Secondary | ICD-10-CM

## 2022-03-04 DIAGNOSIS — R7989 Other specified abnormal findings of blood chemistry: Secondary | ICD-10-CM | POA: Diagnosis not present

## 2022-03-04 DIAGNOSIS — I10 Essential (primary) hypertension: Secondary | ICD-10-CM | POA: Diagnosis not present

## 2022-03-04 DIAGNOSIS — E7800 Pure hypercholesterolemia, unspecified: Secondary | ICD-10-CM | POA: Diagnosis not present

## 2022-03-05 DIAGNOSIS — K219 Gastro-esophageal reflux disease without esophagitis: Secondary | ICD-10-CM | POA: Diagnosis not present

## 2022-03-05 DIAGNOSIS — M1990 Unspecified osteoarthritis, unspecified site: Secondary | ICD-10-CM | POA: Diagnosis not present

## 2022-03-05 DIAGNOSIS — I679 Cerebrovascular disease, unspecified: Secondary | ICD-10-CM | POA: Diagnosis not present

## 2022-03-05 DIAGNOSIS — J309 Allergic rhinitis, unspecified: Secondary | ICD-10-CM | POA: Diagnosis not present

## 2022-03-05 DIAGNOSIS — I10 Essential (primary) hypertension: Secondary | ICD-10-CM | POA: Diagnosis not present

## 2022-03-05 DIAGNOSIS — Z0000 Encounter for general adult medical examination without abnormal findings: Secondary | ICD-10-CM | POA: Diagnosis not present

## 2022-03-05 DIAGNOSIS — E7800 Pure hypercholesterolemia, unspecified: Secondary | ICD-10-CM | POA: Diagnosis not present

## 2022-03-05 DIAGNOSIS — R7989 Other specified abnormal findings of blood chemistry: Secondary | ICD-10-CM | POA: Diagnosis not present

## 2022-03-05 NOTE — Progress Notes (Signed)
EPIC Encounter for ICM Monitoring  Patient Name: Marie Ramos is a 66 y.o. female Date: 03/05/2022 Primary Care Physican: Lawerance Cruel, MD Primary Cardiologist: Ali Lowe Electrophysiologist: Curt Bears 11/25/2021 Weight: 261 lbs 12/18/2021 Weight: 261 lbs   Time in AT/AF  0.0 hr/day (0.0%) (taking Eliquis)         Spoke with patient and heart failure questions reviewed.  Pt reports is well and was in the MD office.  She will be traveling to [place] in September.          Optivol thoracic impedance suggesting fluid levels returned to normal.   Prescribed:  Furosemide 40 mg Take 1 tablet (40 mg total) by mouth daily. Jardiance 10 mg take 1 tablet by mouth daily.   Labs: 11/27/2021 Creatinine 1.26, BUN 31, Potassium 5.0, Sodium 142, GFR 47 11/15/2021 Creatinine 1.05, BUN 41, Potassium 5.1, Sodium 139, GFR 59 10/10/2021 Creatinine 1.21, BUN 25, Potassium 5.2, Sodium 142 A complete set of results can be found in Results Review.   Recommendations:  Recommendation to limit salt intake to 2000 mg daily and fluid intake to 64 oz daily.  Encouraged to call if experiencing any fluid symptoms.    Follow-up plan: ICM clinic phone appointment on 03/31/2022.  91 day device clinic remote transmission 05/15/2022.      EP/Cardiology Office Visits:  Recall 11/04/2021 with Tommye Standard, PA.  Recall 08/05/2022 with Dr Ali Lowe.   Copy of ICM check sent to Dr. Curt Bears (replacing Dr Rayann Heman).   3 month ICM trend: 03/03/2022.    12-14 Month ICM trend:     [name], RN 03/05/2022 4:01 PM

## 2022-03-06 ENCOUNTER — Other Ambulatory Visit: Payer: Self-pay | Admitting: Family Medicine

## 2022-03-06 DIAGNOSIS — E2839 Other primary ovarian failure: Secondary | ICD-10-CM

## 2022-03-11 NOTE — Progress Notes (Signed)
Remote ICD transmission.   

## 2022-03-31 ENCOUNTER — Ambulatory Visit (INDEPENDENT_AMBULATORY_CARE_PROVIDER_SITE_OTHER): Payer: Medicare HMO

## 2022-03-31 DIAGNOSIS — Z95810 Presence of automatic (implantable) cardiac defibrillator: Secondary | ICD-10-CM

## 2022-03-31 DIAGNOSIS — I5032 Chronic diastolic (congestive) heart failure: Secondary | ICD-10-CM | POA: Diagnosis not present

## 2022-04-03 NOTE — Progress Notes (Signed)
EPIC Encounter for ICM Monitoring  Patient Name: Marie Ramos is a 66 y.o. female Date: 04/03/2022 Primary Care Physican: Lawerance Cruel, MD Primary Cardiologist: Ali Lowe Electrophysiologist: Curt Bears 11/25/2021 Weight: 261 lbs 12/18/2021 Weight: 261 lbs 04/03/2022 Weight: 264 lbs   Time in AT/AF  0.0 hr/day (0.0%) (taking Eliquis)         Spoke with patient and heart failure questions reviewed.  Pt is asymptomatic for fluid accumulation.  She is feeling well.          Optivol thoracic impedance suggesting normal fluid levels.   Prescribed:  Furosemide 40 mg Take 1 tablet (40 mg total) by mouth daily. Jardiance 10 mg take 1 tablet by mouth daily.   Labs: 11/27/2021 Creatinine 1.26, BUN 31, Potassium 5.0, Sodium 142, GFR 47 11/15/2021 Creatinine 1.05, BUN 41, Potassium 5.1, Sodium 139, GFR 59 10/10/2021 Creatinine 1.21, BUN 25, Potassium 5.2, Sodium 142 A complete set of results can be found in Results Review.   Recommendations:    Encouraged to call if experiencing any fluid symptoms.    Follow-up plan: ICM clinic phone appointment on 05/05/2022.  91 day device clinic remote transmission 05/15/2022.      EP/Cardiology Office Visits:  Recall 11/04/2021 with Tommye Standard, PA.  Recall 08/05/2022 with Dr Ali Lowe.   Copy of ICM check sent to Dr. Curt Bears.   3 month ICM trend: 03/31/2022.    12-14 Month ICM trend:     [name], RN 04/03/2022 1:37 PM

## 2022-04-14 DIAGNOSIS — Z0000 Encounter for general adult medical examination without abnormal findings: Secondary | ICD-10-CM | POA: Diagnosis not present

## 2022-04-25 ENCOUNTER — Other Ambulatory Visit: Payer: Self-pay | Admitting: Physician Assistant

## 2022-05-05 ENCOUNTER — Encounter: Payer: Self-pay | Admitting: Physician Assistant

## 2022-05-05 ENCOUNTER — Ambulatory Visit (INDEPENDENT_AMBULATORY_CARE_PROVIDER_SITE_OTHER): Payer: Medicare HMO

## 2022-05-05 ENCOUNTER — Ambulatory Visit (INDEPENDENT_AMBULATORY_CARE_PROVIDER_SITE_OTHER): Payer: Medicare HMO | Admitting: Physician Assistant

## 2022-05-05 DIAGNOSIS — Z95810 Presence of automatic (implantable) cardiac defibrillator: Secondary | ICD-10-CM | POA: Diagnosis not present

## 2022-05-05 DIAGNOSIS — R9431 Abnormal electrocardiogram [ECG] [EKG]: Secondary | ICD-10-CM | POA: Diagnosis not present

## 2022-05-05 DIAGNOSIS — F411 Generalized anxiety disorder: Secondary | ICD-10-CM

## 2022-05-05 DIAGNOSIS — F5105 Insomnia due to other mental disorder: Secondary | ICD-10-CM

## 2022-05-05 DIAGNOSIS — I5032 Chronic diastolic (congestive) heart failure: Secondary | ICD-10-CM

## 2022-05-05 DIAGNOSIS — F99 Mental disorder, not otherwise specified: Secondary | ICD-10-CM

## 2022-05-05 DIAGNOSIS — R69 Illness, unspecified: Secondary | ICD-10-CM

## 2022-05-05 DIAGNOSIS — F319 Bipolar disorder, unspecified: Secondary | ICD-10-CM

## 2022-05-05 DIAGNOSIS — F331 Major depressive disorder, recurrent, moderate: Secondary | ICD-10-CM | POA: Diagnosis not present

## 2022-05-05 MED ORDER — VENLAFAXINE HCL ER 75 MG PO CP24: 75.0000 mg | ORAL_CAPSULE | Freq: Every day | ORAL | 1 refills | Status: DC

## 2022-05-05 MED ORDER — OXCARBAZEPINE 300 MG PO TABS
450.0000 mg | ORAL_TABLET | Freq: Two times a day (BID) | ORAL | 3 refills | Status: DC
Start: 2022-05-05 — End: 2022-07-30

## 2022-05-05 MED ORDER — ALPRAZOLAM 0.25 MG PO TABS: 0.1250 mg | ORAL_TABLET | Freq: Three times a day (TID) | ORAL | 1 refills | Status: DC | PRN

## 2022-05-05 NOTE — Progress Notes (Signed)
[facility]  Patient ID: Marie Ramos,  MRN: [id number]  PCP: Lawerance Cruel, MD  Date of Evaluation: 01/31/2022 Time spent:30 minutes  Chief Complaint:  Chief Complaint   Depression    HISTORY/CURRENT STATUS: For routine med check. Husband, Marie Ramos is with her.   Has been depressed for a few months. Husband gives most of the hx. Anhedonia. He pushes her sometimes to go out but she doesn't initiate anything. Not working, and doesn't want to. Sleeps ok. Doesn't always feel rested when she gets up. Appetite is about the same, she doesn't want to cook. Marie Ramos has to do all the shopping. She doesn't want to bake. Forgets things more often. Personal hygiene is nl. Not crying easily. No SI/HI.   Not anxious very much. Takes Xanax at night only or she couldn't relax to go to sleep.   Patient denies increased energy with decreased need for sleep, increased talkativeness, racing thoughts, impulsivity or risky behaviors, increased spending, increased libido, grandiosity, increased irritability or anger, paranoia, or hallucinations.  Review of Systems  Constitutional:  Positive for malaise/fatigue.  HENT: Negative.    Eyes: Negative.   Respiratory: Negative.    Cardiovascular: Negative.   Gastrointestinal: Negative.   Genitourinary: Negative.   Musculoskeletal: Negative.   Skin: Negative.   Neurological: Negative.   Endo/Heme/Allergies: Negative.   Psychiatric/Behavioral:         See HPI    Individual Medical History/ Review of Systems: Changes? :No       Past medications for mental health diagnoses include: Lithium caused diarrhea and increased thirst, Trileptal, Effexor XR, Seroquel worked well, Abilify unknown what happened, Adderall, Risperdal helped mania but caused bilateral lower extremity edema and dry mouth.  Allergies: Sulfa antibiotics, Albuterol, Other, Sulfamethoxazole-trimethoprim, Vicodin hp [hydrocodone-acetaminophen], Naproxen, and Vicodin  [hydrocodone-acetaminophen]  Current Medications:  Current Outpatient Medications:    Acetylcysteine (NAC) 600 MG CAPS, Take 1 capsule (600 mg total) by mouth daily., Disp: 30 capsule, Rfl: 11   amLODipine (NORVASC) 5 MG tablet, Take 1 tablet (5 mg total) by mouth daily with breakfast., Disp: 30 tablet, Rfl: 0   Ascorbic Acid (VITAMIN C PO), Take 1 capsule by mouth daily., Disp: , Rfl:    aspirin EC 81 MG tablet, Take 1 tablet (81 mg total) by mouth daily. Swallow whole., Disp: 90 tablet, Rfl: 3   atorvastatin (LIPITOR) 20 MG tablet, Take 1 tablet (20 mg total) by mouth daily at 6 PM., Disp: 30 tablet, Rfl: 0   carvedilol (COREG) 12.5 MG tablet, TAKE ONE TABLET BY MOUTH TWICE DAILY WITH BREAKFAST AND DINNER, Disp: 180 tablet, Rfl: 2   cholecalciferol (VITAMIN D3) 25 MCG (1000 UNIT) tablet, Take 2,000 Units by mouth daily., Disp: , Rfl:    famotidine (PEPCID) 20 MG tablet, Take 20 mg by mouth at bedtime., Disp: , Rfl:    fexofenadine (ALLEGRA) 180 MG tablet, Take 180 mg by mouth daily., Disp: , Rfl:    fluocinonide (LIDEX) 0.05 % external solution, as needed., Disp: , Rfl:    fluticasone (FLONASE) 50 MCG/ACT nasal spray, Place 1 spray into both nostrils daily. Use as directed, Disp: , Rfl:    furosemide (LASIX) 40 MG tablet, Take 1 tablet (40 mg total) by mouth daily., Disp: 90 tablet, Rfl: 3   Ginkgo Biloba 40 MG TABS, Take 1 tablet (40 mg total) by mouth 2 (two) times daily., Disp: 180 tablet, Rfl: 11   losartan (COZAAR) 100 MG tablet, Take 1 tablet (100 mg total) by mouth  daily., Disp: 90 tablet, Rfl: 3   Multiple Vitamin (MULTIVITAMIN WITH MINERALS) TABS tablet, Take 1 tablet by mouth daily., Disp: , Rfl:    Multiple Vitamins-Minerals (OCUVITE PO), Take 1 capsule by mouth daily., Disp: , Rfl:    triamcinolone ointment (KENALOG) 0.5 %, Apply 1 application topically as needed., Disp: , Rfl: 0   venlafaxine XR (EFFEXOR XR) 75 MG 24 hr capsule, Take 1 capsule (75 mg total) by mouth daily with  breakfast. Take with Effexor XR 300 mg, Disp: 30 capsule, Rfl: 1   venlafaxine XR (EFFEXOR-XR) 150 MG 24 hr capsule, TAKE 2 CAPSULES AT BEDTIME, Disp: 180 capsule, Rfl: 1   ALPRAZolam (XANAX) 0.25 MG tablet, Take 0.5-1 tablets (0.125-0.25 mg total) by mouth 3 (three) times daily as needed for anxiety., Disp: 90 tablet, Rfl: 1   empagliflozin (JARDIANCE) 10 MG TABS tablet, Take 1 tablet (10 mg total) by mouth daily. (Patient not taking: Reported on 05/05/2022), Disp: 30 tablet, Rfl: 5   Oxcarbazepine (TRILEPTAL) 300 MG tablet, Take 1.5 tablets (450 mg total) by mouth 2 (two) times daily., Disp: 90 tablet, Rfl: 3   Risankizumab-rzaa (SKYRIZI, 150 MG DOSE, [place]), Inject into the skin every 3 (three) months. (Patient not taking: Reported on 11/25/2021), Disp: , Rfl:    Semaglutide,0.25 or 0.'5MG'$ /DOS, 2 MG/3ML SOPN, Inject 0.'25mg'$  subcutaneously once weekly for 4 weeks, then increase to 0.'5mg'$  subcutaneously (Patient not taking: Reported on 01/31/2022), Disp: 3 mL, Rfl: 1 Medication Side Effects: none  Family Medical/ Social History: Changes? No  MENTAL HEALTH EXAM:  There were no vitals taken for this visit.There is no height or weight on file to calculate BMI.  General Appearance: Casual, Well Groomed, and Obese  Eye Contact:  Good  Speech:  Clear and Coherent and Normal Rate  Volume:  Normal  Mood:  Depressed  Affect:  Congruent       Thought Process:  Goal Directed and Descriptions of Associations: Circumstantial  Orientation:  Full (Time, Place, and Person)  Thought Content: Logical   Suicidal Thoughts:  No  Homicidal Thoughts:  No  Memory:  Immediate;   Good and Fair Recent;   Good Remote;   Good  Judgement:  Good  Insight:  Good  Psychomotor Activity:  Normal  Concentration:  Concentration: Good and Attention Span: Good  Recall:  Good  Fund of Knowledge: Good  Language: Good  Assets:  Desire for Improvement Financial Resources/Insurance Housing Social Support Transportation  ADL's:   Intact  Cognition: WNL  Prognosis:  Good   DIAGNOSES:    ICD-10-CM   1. Major depressive disorder, recurrent episode, moderate ([facility])  F33.1     2. Bipolar I disorder ([place])  F31.9     3. Generalized anxiety disorder  F41.1     4. QT prolongation  R94.31     5. Chronic illness  R69     6. Insomnia due to other mental disorder  F51.05    F99       Receiving Psychotherapy: No   RECOMMENDATIONS:  PDMP reviewed. Last Xanax 03/18/2022. I provided 30 minutes of face to face time during this encounter, including time spent before and after the visit in records review, medical decision making, counseling pertinent to today's visit, and charting.   We discussed her symptoms.  She has responded to Effexor and has been on the current dose for a year.  Due to multiple other medical problems it is difficult to find safe medication options.  I have discussed this  with her cardiology pharmacist before and the Effexor is one of the safest choices, so I will increase that dose to maximum and follow-up in a month or so.  Recommend mood therapy lamp.  Continue Xanax 0.25 mg, 1 p.o. nightly as needed sleep or anxiety. Continue Trileptal 300 mg, 1.5 pills p.o. twice daily. Continue Effexor XR 150 mg, 2 p.o. daily plus Effexor XR 75 mg, 1 p.o. daily. Continue NAC, multivitamin, B complex, fish oil, vitamin D 2000 IUs daily, ginkgo biloba. Return in 6 weeks.  Avoid drugs that are known to cause prolonged QT interval  Donnal Moat, PA-C

## 2022-05-06 NOTE — Progress Notes (Signed)
EPIC Encounter for ICM Monitoring  Patient Name: Marie Ramos is a 66 y.o. female Date: 05/06/2022 Primary Care Physican: Lawerance Cruel, MD Primary Cardiologist: Ali Lowe Electrophysiologist: Curt Bears 11/25/2021 Weight: 261 lbs 12/18/2021 Weight: 261 lbs 04/03/2022 Weight: 264 lbs   Time in AT/AF  0.0 hr/day (0.0%) (taking Eliquis)         Spoke with patient and heart failure questions reviewed.  Transmission results reviewed.  Pt asymptomatic for fluid accumulation.               Diet:  [employer] foods couple of times a week.           Optivol thoracic impedance suggesting possible fluid accumulation starting 10/13.   Prescribed:  Furosemide 40 mg Take 1 tablet (40 mg total) by mouth daily. Jardiance 10 mg take 1 tablet by mouth daily.   Labs: 11/27/2021 Creatinine 1.26, BUN 31, Potassium 5.0, Sodium 142, GFR 47 11/15/2021 Creatinine 1.05, BUN 41, Potassium 5.1, Sodium 139, GFR 59 10/10/2021 Creatinine 1.21, BUN 25, Potassium 5.2, Sodium 142 A complete set of results can be found in Results Review.   Recommendations:  Advised to avoid restaurant foods if possible due to high salt content.  Recommendation to limit salt intake to 2000 mg daily and fluid intake to 64 oz daily.  Encouraged to call if experiencing any fluid symptoms.    Follow-up plan: ICM clinic phone appointment on 05/19/2022 to recheck fluid levels.  91 day device clinic remote transmission 05/15/2022.      EP/Cardiology Office Visits:  Recall 1/15/20245 with Dr Curt Bears.  Recall 08/05/2022 with Dr Ali Lowe.   Copy of ICM check sent to Dr. Curt Bears.   3 month ICM trend: 05/05/2022.    12-14 Month ICM trend:     [name], RN 05/06/2022 7:45 AM

## 2022-05-15 ENCOUNTER — Ambulatory Visit (INDEPENDENT_AMBULATORY_CARE_PROVIDER_SITE_OTHER): Payer: Medicare HMO

## 2022-05-15 DIAGNOSIS — I469 Cardiac arrest, cause unspecified: Secondary | ICD-10-CM | POA: Diagnosis not present

## 2022-05-15 LAB — CUP PACEART REMOTE DEVICE CHECK
Battery Remaining Longevity: 30 mo
Battery Voltage: 2.95 V
Brady Statistic AP VP Percent: 0 %
Brady Statistic AP VS Percent: 0.22 %
Brady Statistic AS VP Percent: 0.06 %
Brady Statistic AS VS Percent: 99.72 %
Brady Statistic RA Percent Paced: 0.22 %
Brady Statistic RV Percent Paced: 0.06 %
Date Time Interrogation Session: 20231109031605
HighPow Impedance: 99 Ohm
Implantable Lead Connection Status: 753985
Implantable Lead Connection Status: 753985
Implantable Lead Implant Date: 20170227
Implantable Lead Implant Date: 20170227
Implantable Lead Location: 753859
Implantable Lead Location: 753860
Implantable Lead Model: 5076
Implantable Pulse Generator Implant Date: 20170227
Lead Channel Impedance Value: 399 Ohm
Lead Channel Impedance Value: 399 Ohm
Lead Channel Impedance Value: 532 Ohm
Lead Channel Pacing Threshold Amplitude: 0.625 V
Lead Channel Pacing Threshold Amplitude: 0.625 V
Lead Channel Pacing Threshold Pulse Width: 0.4 ms
Lead Channel Pacing Threshold Pulse Width: 0.4 ms
Lead Channel Sensing Intrinsic Amplitude: 21.625 mV
Lead Channel Sensing Intrinsic Amplitude: 21.625 mV
Lead Channel Sensing Intrinsic Amplitude: 3 mV
Lead Channel Sensing Intrinsic Amplitude: 3 mV
Lead Channel Setting Pacing Amplitude: 2 V
Lead Channel Setting Pacing Amplitude: 2.5 V
Lead Channel Setting Pacing Pulse Width: 0.4 ms
Lead Channel Setting Sensing Sensitivity: 0.3 mV
Zone Setting Status: 755011
Zone Setting Status: 755011

## 2022-05-16 DIAGNOSIS — H1032 Unspecified acute conjunctivitis, left eye: Secondary | ICD-10-CM | POA: Diagnosis not present

## 2022-05-19 ENCOUNTER — Ambulatory Visit (INDEPENDENT_AMBULATORY_CARE_PROVIDER_SITE_OTHER): Payer: Medicare HMO

## 2022-05-19 DIAGNOSIS — I5032 Chronic diastolic (congestive) heart failure: Secondary | ICD-10-CM

## 2022-05-19 DIAGNOSIS — Z95810 Presence of automatic (implantable) cardiac defibrillator: Secondary | ICD-10-CM

## 2022-05-21 NOTE — Progress Notes (Signed)
EPIC Encounter for ICM Monitoring  Patient Name: Marie Ramos is a 66 y.o. female Date: 05/21/2022 Primary Care Physican: Lawerance Cruel, MD Primary Cardiologist: Ali Lowe Electrophysiologist: Curt Bears 11/25/2021 Weight: 261 lbs 12/18/2021 Weight: 261 lbs 04/03/2022 Weight: 264 lbs 05/21/2022 Weight: unknown   Time in AT/AF  0.0 hr/day (0.0%) (taking Eliquis)         Spoke with patient and heart failure questions reviewed.  Transmission results reviewed.  Pt asymptomatic for fluid accumulation.  Does not weight at home very often.  Unsure of last weight. Dicussed weighing daily to monitor for fluid weight.              Diet:  Is not strict with limiting salt.  Eats restaurant foods couple of times a week.           Optivol thoracic impedance suggesting possible fluid accumulation starting 10/13.   Prescribed:  Furosemide 40 mg Take 1 tablet (40 mg total) by mouth daily. Jardiance 10 mg take 1 tablet by mouth daily.   Labs: 11/27/2021 Creatinine 1.26, BUN 31, Potassium 5.0, Sodium 142, GFR 47 11/15/2021 Creatinine 1.05, BUN 41, Potassium 5.1, Sodium 139, GFR 59 10/10/2021 Creatinine 1.21, BUN 25, Potassium 5.2, Sodium 142 A complete set of results can be found in Results Review.   Recommendations:  Copy sent to Dr Ali Lowe for review and recommendations if needed.  Discussed daily morning weights and limiting salt intake.  Confirms she take Furosemide 40 mg daily as prescribed.    Follow-up plan: ICM clinic phone appointment on 06/09/2022 for 31 day and recheck fluid levels.  91 day device clinic remote transmission 08/14/2022.      EP/Cardiology Office Visits:  Recall 1/15/20245 with Dr Curt Bears.  Recall 08/05/2022 with Dr Ali Lowe.   Copy of ICM check sent to Dr. Curt Bears.   3 month ICM trend: 05/19/2022.    12-14 Month ICM trend:     [name], RN 05/21/2022 10:56 AM

## 2022-05-22 NOTE — Progress Notes (Signed)
Remote ICD transmission.   

## 2022-05-23 NOTE — Progress Notes (Signed)
Returned call to patient.  Her weight today 255 lbs.   She asked if there any changes needed to her meds for fluid and advised not at this time.  Advised to continue with current Furosemide dosage and if any recommendations are received will call her back.  Will recheck manual remote transmission on 11/20.

## 2022-05-28 NOTE — Progress Notes (Signed)
No ICM remote transmission received for 05/26/2022 and next ICM transmission scheduled for 06/09/2022.

## 2022-06-09 ENCOUNTER — Ambulatory Visit (INDEPENDENT_AMBULATORY_CARE_PROVIDER_SITE_OTHER): Payer: Medicare HMO

## 2022-06-09 DIAGNOSIS — I5032 Chronic diastolic (congestive) heart failure: Secondary | ICD-10-CM

## 2022-06-09 DIAGNOSIS — Z95810 Presence of automatic (implantable) cardiac defibrillator: Secondary | ICD-10-CM

## 2022-06-09 NOTE — Progress Notes (Signed)
EPIC Encounter for ICM Monitoring  Patient Name: Marie Ramos is a 66 y.o. female Date: 06/09/2022 Primary Care Physican: Lawerance Cruel, MD Primary Cardiologist: Ali Lowe Electrophysiologist: Curt Bears 11/25/2021 Weight: 261 lbs 12/18/2021 Weight: 261 lbs 04/03/2022 Weight: 264 lbs 05/21/2022 Weight: unknown   Time in AT/AF  0.0 hr/day (0.0%) (taking Eliquis)         Spoke with patient and heart failure questions reviewed.  Transmission results reviewed.  Pt asymptomatic for fluid accumulation.  Reports feeling well at this time and voices no complaints.                Diet:  Is not strict with limiting salt.  Eats restaurant foods couple of times a week.           Optivol thoracic impedance suggesting possible fluid accumulation starting 10/13 and returning close to baseline.   Prescribed:  Furosemide 40 mg Take 1 tablet (40 mg total) by mouth daily. Jardiance 10 mg take 1 tablet by mouth daily.   Labs: 11/27/2021 Creatinine 1.26, BUN 31, Potassium 5.0, Sodium 142, GFR 47 11/15/2021 Creatinine 1.05, BUN 41, Potassium 5.1, Sodium 139, GFR 59 10/10/2021 Creatinine 1.21, BUN 25, Potassium 5.2, Sodium 142 A complete set of results can be found in Results Review.   Recommendations:  Recommendation to limit salt intake to 2000 mg daily and fluid intake to 64 oz daily.  Encouraged to call if experiencing any fluid symptoms.     Follow-up plan: ICM clinic phone appointment on 07/21/2022.  91 day device clinic remote transmission 08/14/2022.      EP/Cardiology Office Visits:  Recall 1/15/20245 with Dr Curt Bears.  Recall 08/05/2022 with Dr Ali Lowe.   Copy of ICM check sent to Dr. Curt Bears.   3 month ICM trend: 06/09/2022.    12-14 Month ICM trend:     Marie Billings, RN 06/09/2022 9:29 AM

## 2022-06-18 ENCOUNTER — Ambulatory Visit (INDEPENDENT_AMBULATORY_CARE_PROVIDER_SITE_OTHER): Payer: Medicare HMO | Admitting: Physician Assistant

## 2022-06-18 ENCOUNTER — Encounter: Payer: Self-pay | Admitting: Physician Assistant

## 2022-06-18 DIAGNOSIS — R9431 Abnormal electrocardiogram [ECG] [EKG]: Secondary | ICD-10-CM

## 2022-06-18 DIAGNOSIS — F411 Generalized anxiety disorder: Secondary | ICD-10-CM

## 2022-06-18 DIAGNOSIS — R69 Illness, unspecified: Secondary | ICD-10-CM | POA: Diagnosis not present

## 2022-06-18 DIAGNOSIS — F99 Mental disorder, not otherwise specified: Secondary | ICD-10-CM

## 2022-06-18 DIAGNOSIS — F5105 Insomnia due to other mental disorder: Secondary | ICD-10-CM

## 2022-06-18 DIAGNOSIS — F319 Bipolar disorder, unspecified: Secondary | ICD-10-CM

## 2022-06-18 MED ORDER — VENLAFAXINE HCL ER 75 MG PO CP24
75.0000 mg | ORAL_CAPSULE | Freq: Every day | ORAL | 1 refills | Status: DC
Start: 2022-06-18 — End: 2022-11-24

## 2022-06-18 NOTE — Progress Notes (Signed)
[facility]  Patient ID: Marie Ramos,  MRN: [id number]  PCP: Lawerance Cruel, MD  Date of Evaluation: 06/18/2022 Time spent:20 minutes  Chief Complaint:  Chief Complaint   Anxiety; Depression; Insomnia; Follow-up    HISTORY/CURRENT STATUS: For routine med check. [name], husband, is with her.   Increased the Effexor 6 wks ago. She is some better. [name] says it's not helping as much as he'd like, but there has been a gradual improvement.  Patient states she is at least 60% better, maybe 70%.  She has driven once by herself, took the initiative to go somewhere, which hasn't happened in months. Still hasn't started baking again, which she normally loves, but she hopes to get to that point soon, and teach her granddaughter some things. Energy and motivation are fair in the morning but gets better as the day goes on.  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   No extreme sadness, tearfulness, or feelings of hopelessness.  Sleeps well most of the time, takes Xanax in the evening to help her relax to go to sleep.  Not needing the Xanax any other time.  No panic attacks.  ADLs and personal hygiene are normal.   Denies any changes in concentration, making decisions, or remembering things.  Appetite has not changed.  Weight is stable.  Denies suicidal or homicidal thoughts.  Patient denies increased energy with decreased need for sleep, increased talkativeness, racing thoughts, impulsivity or risky behaviors, increased spending, increased libido, grandiosity, increased irritability or anger, paranoia, or hallucinations.  Denies dizziness, syncope, seizures, numbness, tingling, tremor, tics, unsteady gait, slurred speech, confusion. Denies muscle or joint pain, stiffness, or dystonia.  Individual Medical History/ Review of Systems: Changes? :No       Past medications for mental health diagnoses include: Lithium caused diarrhea and increased  thirst, Trileptal, Effexor XR, Seroquel worked well, Abilify unknown what happened, Adderall, Risperdal helped mania but caused bilateral lower extremity edema and dry mouth.  Allergies: Sulfa antibiotics, Albuterol, Other, Sulfamethoxazole-trimethoprim, Vicodin hp [hydrocodone-acetaminophen], Naproxen, and Vicodin [hydrocodone-acetaminophen]  Current Medications:  Current Outpatient Medications:    Acetylcysteine (NAC) 600 MG CAPS, Take 1 capsule (600 mg total) by mouth daily., Disp: 30 capsule, Rfl: 11   ALPRAZolam (XANAX) 0.25 MG tablet, Take 0.5-1 tablets (0.125-0.25 mg total) by mouth 3 (three) times daily as needed for anxiety., Disp: 90 tablet, Rfl: 1   amLODipine (NORVASC) 5 MG tablet, Take 1 tablet (5 mg total) by mouth daily with breakfast., Disp: 30 tablet, Rfl: 0   Ascorbic Acid (VITAMIN C PO), Take 1 capsule by mouth daily., Disp: , Rfl:    aspirin EC 81 MG tablet, Take 1 tablet (81 mg total) by mouth daily. Swallow whole., Disp: 90 tablet, Rfl: 3   atorvastatin (LIPITOR) 20 MG tablet, Take 1 tablet (20 mg total) by mouth daily at 6 PM., Disp: 30 tablet, Rfl: 0   carvedilol (COREG) 12.5 MG tablet, TAKE ONE TABLET BY MOUTH TWICE DAILY WITH BREAKFAST AND DINNER, Disp: 180 tablet, Rfl: 2   cholecalciferol (VITAMIN D3) 25 MCG (1000 UNIT) tablet, Take 2,000 Units by mouth daily., Disp: , Rfl:    famotidine (PEPCID) 20 MG tablet, Take 20 mg by mouth at bedtime., Disp: , Rfl:    fexofenadine (ALLEGRA) 180 MG tablet, Take 180 mg by mouth daily., Disp: , Rfl:    fluocinonide (LIDEX) 0.05 % external solution, as needed., Disp: , Rfl:    fluticasone (FLONASE) 50 MCG/ACT  nasal spray, Place 1 spray into both nostrils daily. Use as directed, Disp: , Rfl:    furosemide (LASIX) 40 MG tablet, Take 1 tablet (40 mg total) by mouth daily., Disp: 90 tablet, Rfl: 3   Ginkgo Biloba 40 MG TABS, Take 1 tablet (40 mg total) by mouth 2 (two) times daily., Disp: 180 tablet, Rfl: 11   losartan (COZAAR) 100 MG  tablet, Take 1 tablet (100 mg total) by mouth daily., Disp: 90 tablet, Rfl: 3   Multiple Vitamin (MULTIVITAMIN WITH MINERALS) TABS tablet, Take 1 tablet by mouth daily., Disp: , Rfl:    Multiple Vitamins-Minerals (OCUVITE PO), Take 1 capsule by mouth daily., Disp: , Rfl:    Oxcarbazepine (TRILEPTAL) 300 MG tablet, Take 1.5 tablets (450 mg total) by mouth 2 (two) times daily., Disp: 90 tablet, Rfl: 3   triamcinolone ointment (KENALOG) 0.5 %, Apply 1 application topically as needed., Disp: , Rfl: 0   venlafaxine XR (EFFEXOR-XR) 150 MG 24 hr capsule, TAKE 2 CAPSULES AT BEDTIME, Disp: 180 capsule, Rfl: 1   empagliflozin (JARDIANCE) 10 MG TABS tablet, Take 1 tablet (10 mg total) by mouth daily. (Patient not taking: Reported on 05/05/2022), Disp: 30 tablet, Rfl: 5   Risankizumab-rzaa (SKYRIZI, 150 MG DOSE, [place]), Inject into the skin every 3 (three) months. (Patient not taking: Reported on 11/25/2021), Disp: , Rfl:    Semaglutide,0.25 or 0.'5MG'$ /DOS, 2 MG/3ML SOPN, Inject 0.'25mg'$  subcutaneously once weekly for 4 weeks, then increase to 0.'5mg'$  subcutaneously (Patient not taking: Reported on 01/31/2022), Disp: 3 mL, Rfl: 1   venlafaxine XR (EFFEXOR XR) 75 MG 24 hr capsule, Take 1 capsule (75 mg total) by mouth daily with breakfast. Take with Effexor XR 300 mg, Disp: 90 capsule, Rfl: 1 Medication Side Effects: none  Family Medical/ Social History: Changes?  Son is having more depression, relationship between patient, her husband, and their daughter-in-law is strained.  MENTAL HEALTH EXAM:  There were no vitals taken for this visit.There is no height or weight on file to calculate BMI.  General Appearance: Casual, Well Groomed, and Obese  Eye Contact:  Good  Speech:  Clear and Coherent and Normal Rate  Volume:  Normal  Mood:  Euthymic  Affect:  Congruent       Thought Process:  Goal Directed and Descriptions of Associations: Circumstantial  Orientation:  Full (Time, Place, and Person)  Thought Content:  Logical   Suicidal Thoughts:  No  Homicidal Thoughts:  No  Memory:  Immediate;   Good and Fair Recent;   Good Remote;   Good  Judgement:  Good  Insight:  Good  Psychomotor Activity:  Normal  Concentration:  Concentration: Good and Attention Span: Good  Recall:  Good  Fund of Knowledge: Good  Language: Good  Assets:  Desire for Improvement Financial Resources/Insurance Housing Social Support Transportation  ADL's:  Intact  Cognition: WNL  Prognosis:  Good   DIAGNOSES:    ICD-10-CM   1. Bipolar depression ([place])  F31.9     2. Generalized anxiety disorder  F41.1     3. Insomnia due to other mental disorder  F51.05    F99     4. Chronic illness  R69     5. QT prolongation  R94.31       Receiving Psychotherapy: No   RECOMMENDATIONS:  PDMP reviewed. Last Xanax 05/05/2022. I provided 20 minutes of face to face time during this encounter, including time spent before and after the visit in records review, medical decision making, counseling  pertinent to today's visit, and charting.   The depression is improving, I do not think she has reached maximum benefit with the increase of Effexor 6 weeks ago so we all agree to continue the same treatment and reevaluate in 4 to 6 weeks.    Again recommend mood therapy lamp.  Continue Xanax 0.25 mg, 1 p.o. nightly as needed sleep or anxiety. Continue Trileptal 300 mg, 1.5 pills p.o. twice daily. Continue Effexor XR 150 mg, 2 p.o. daily plus Effexor XR 75 mg, 1 p.o. daily. Continue NAC, multivitamin, B complex, fish oil, vitamin D 2000 IUs daily, ginkgo biloba. Return in 4-6 weeks.  Avoid drugs that are known to cause prolonged QT interval  Donnal Moat, PA-C

## 2022-07-21 ENCOUNTER — Ambulatory Visit (INDEPENDENT_AMBULATORY_CARE_PROVIDER_SITE_OTHER): Payer: Medicare HMO

## 2022-07-21 DIAGNOSIS — Z95810 Presence of automatic (implantable) cardiac defibrillator: Secondary | ICD-10-CM

## 2022-07-21 DIAGNOSIS — I5032 Chronic diastolic (congestive) heart failure: Secondary | ICD-10-CM | POA: Diagnosis not present

## 2022-07-24 NOTE — Progress Notes (Signed)
EPIC Encounter for ICM Monitoring  Patient Name: Marie Ramos is a 67 y.o. female Date: 07/24/2022 Primary Care Physican: Lawerance Cruel, MD Primary Cardiologist: Ali Lowe Electrophysiologist: Curt Bears 11/25/2021 Weight: 261 lbs 12/18/2021 Weight: 261 lbs 04/03/2022 Weight: 264 lbs 05/21/2022 Weight: unknown   Time in AT/AF  0.0 hr/day (0.0%) (taking Eliquis)         Spoke with patient and heart failure questions reviewed.  Transmission results reviewed.  Pt asymptomatic for fluid accumulation.  Reports feeling well at this time and voices no complaints.                  Diet:  Is not strict with limiting salt.  Eats restaurant foods couple of times a week.           Optivol thoracic impedance suggesting intermittent days with possible fluid accumulation within last month.   Prescribed:  Furosemide 40 mg Take 1 tablet (40 mg total) by mouth daily. Jardiance 10 mg take 1 tablet by mouth daily.   Labs: 11/27/2021 Creatinine 1.26, BUN 31, Potassium 5.0, Sodium 142, GFR 47 11/15/2021 Creatinine 1.05, BUN 41, Potassium 5.1, Sodium 139, GFR 59 10/10/2021 Creatinine 1.21, BUN 25, Potassium 5.2, Sodium 142 A complete set of results can be found in Results Review.   Recommendations:  No changes and encouraged to call if experiencing any fluid symptoms.   Follow-up plan: ICM clinic phone appointment on 08/26/2022.  91 day device clinic remote transmission 08/14/2022.      EP/Cardiology Office Visits:  08/07/2022 Nicholes Rough, PA.  Recall 07/21/2022 with Dr Curt Bears.  Recall 08/05/2022 with Dr Ali Lowe.   Copy of ICM check sent to Dr. Curt Bears.   3 month ICM trend: 07/21/2022.    12-14 Month ICM trend:     [name], RN 07/24/2022 7:53 AM

## 2022-07-30 ENCOUNTER — Encounter: Payer: Self-pay | Admitting: Physician Assistant

## 2022-07-30 ENCOUNTER — Ambulatory Visit: Payer: Medicare HMO | Admitting: Physician Assistant

## 2022-07-30 DIAGNOSIS — Z79899 Other long term (current) drug therapy: Secondary | ICD-10-CM

## 2022-07-30 DIAGNOSIS — F99 Mental disorder, not otherwise specified: Secondary | ICD-10-CM

## 2022-07-30 DIAGNOSIS — R5381 Other malaise: Secondary | ICD-10-CM

## 2022-07-30 DIAGNOSIS — F411 Generalized anxiety disorder: Secondary | ICD-10-CM

## 2022-07-30 DIAGNOSIS — R5383 Other fatigue: Secondary | ICD-10-CM

## 2022-07-30 DIAGNOSIS — F3112 Bipolar disorder, current episode manic without psychotic features, moderate: Secondary | ICD-10-CM | POA: Diagnosis not present

## 2022-07-30 DIAGNOSIS — F5105 Insomnia due to other mental disorder: Secondary | ICD-10-CM | POA: Diagnosis not present

## 2022-07-30 MED ORDER — OXCARBAZEPINE 600 MG PO TABS: 600.0000 mg | ORAL_TABLET | Freq: Two times a day (BID) | ORAL | 1 refills | Status: DC

## 2022-07-30 NOTE — Progress Notes (Signed)
[facility]  Patient ID: Marie Ramos,  MRN: [id number]  PCP: Lawerance Cruel, MD  Date of Evaluation: 07/30/2022 Time spent:40 minutes  Chief Complaint:  Chief Complaint   Anxiety; Depression; Follow-up; Manic Behavior    HISTORY/CURRENT STATUS: For routine med check. [name], husband, is with her.   Celebrated her grandaughter's birthday with a party 3 days ago. For the past 2 days she's been overly happy, has wanted to start substitute teaching again, not spending money, but not sleeping as well but could be d/t ceiling fan, is more talkative, has racing thoughts, no impulsivity or risky behaviors, not grandiose. [name] is telling most of this. Also their dtr has asked [name] if she's ok, she's noticed her being a bit hyper too. No paranoia or hallucintations.   Patient is able to enjoy things.  Energy and motivation are good.   No extreme sadness, tearfulness, or feelings of hopelessness.  Sleeps well most of the time. ADLs and personal hygiene are normal.   Denies any changes in concentration, making decisions, or remembering things.  Appetite has not changed.  Weight is stable.  Anxiety is well controlled.  Usually just takes the Xanax at night.  Denies suicidal or homicidal thoughts.  Review of Systems  Constitutional:  Positive for malaise/fatigue.       Had been tiring easily and until the past few days.  HENT: Negative.    Eyes: Negative.   Respiratory: Negative.    Cardiovascular: Negative.   Gastrointestinal: Negative.   Genitourinary: Negative.   Musculoskeletal: Negative.   Skin: Negative.   Neurological: Negative.   Endo/Heme/Allergies: Negative.   Psychiatric/Behavioral:         See HPI.    Individual Medical History/ Review of Systems: Changes? :No       Past medications for mental health diagnoses include: Lithium caused diarrhea and increased thirst, Trileptal, Effexor XR, Seroquel worked well, Abilify unknown what happened, Adderall,  Risperdal helped mania but caused bilateral lower extremity edema and dry mouth.  Allergies: Sulfa antibiotics, Albuterol, Other, Sulfamethoxazole-trimethoprim, Vicodin hp [hydrocodone-acetaminophen], Naproxen, and Vicodin [hydrocodone-acetaminophen]  Current Medications:  Current Outpatient Medications:    Acetylcysteine (NAC) 600 MG CAPS, Take 1 capsule (600 mg total) by mouth daily., Disp: 30 capsule, Rfl: 11   ALPRAZolam (XANAX) 0.25 MG tablet, Take 0.5-1 tablets (0.125-0.25 mg total) by mouth 3 (three) times daily as needed for anxiety., Disp: 90 tablet, Rfl: 1   amLODipine (NORVASC) 5 MG tablet, Take 1 tablet (5 mg total) by mouth daily with breakfast., Disp: 30 tablet, Rfl: 0   Ascorbic Acid (VITAMIN C PO), Take 1 capsule by mouth daily., Disp: , Rfl:    aspirin EC 81 MG tablet, Take 1 tablet (81 mg total) by mouth daily. Swallow whole., Disp: 90 tablet, Rfl: 3   atorvastatin (LIPITOR) 20 MG tablet, Take 1 tablet (20 mg total) by mouth daily at 6 PM., Disp: 30 tablet, Rfl: 0   carvedilol (COREG) 12.5 MG tablet, TAKE ONE TABLET BY MOUTH TWICE DAILY WITH BREAKFAST AND DINNER, Disp: 180 tablet, Rfl: 2   cholecalciferol (VITAMIN D3) 25 MCG (1000 UNIT) tablet, Take 2,000 Units by mouth daily., Disp: , Rfl:    famotidine (PEPCID) 20 MG tablet, Take 20 mg by mouth at bedtime., Disp: , Rfl:    fexofenadine (ALLEGRA) 180 MG tablet, Take 180 mg by mouth daily., Disp: , Rfl:    fluocinonide (LIDEX) 0.05 % external solution, as needed., Disp: , Rfl:    fluticasone (FLONASE)  50 MCG/ACT nasal spray, Place 1 spray into both nostrils daily. Use as directed, Disp: , Rfl:    furosemide (LASIX) 40 MG tablet, Take 1 tablet (40 mg total) by mouth daily., Disp: 90 tablet, Rfl: 3   Ginkgo Biloba 40 MG TABS, Take 1 tablet (40 mg total) by mouth 2 (two) times daily., Disp: 180 tablet, Rfl: 11   losartan (COZAAR) 100 MG tablet, Take 1 tablet (100 mg total) by mouth daily., Disp: 90 tablet, Rfl: 3   Multiple  Vitamin (MULTIVITAMIN WITH MINERALS) TABS tablet, Take 1 tablet by mouth daily., Disp: , Rfl:    Multiple Vitamins-Minerals (OCUVITE PO), Take 1 capsule by mouth daily., Disp: , Rfl:    oxcarbazepine (TRILEPTAL) 600 MG tablet, Take 1 tablet (600 mg total) by mouth 2 (two) times daily., Disp: 60 tablet, Rfl: 1   triamcinolone ointment (KENALOG) 0.5 %, Apply 1 application topically as needed., Disp: , Rfl: 0   venlafaxine XR (EFFEXOR XR) 75 MG 24 hr capsule, Take 1 capsule (75 mg total) by mouth daily with breakfast. Take with Effexor XR 300 mg, Disp: 90 capsule, Rfl: 1   empagliflozin (JARDIANCE) 10 MG TABS tablet, Take 1 tablet (10 mg total) by mouth daily. (Patient not taking: Reported on 05/05/2022), Disp: 30 tablet, Rfl: 5   Risankizumab-rzaa (SKYRIZI, 150 MG DOSE, [place]), Inject into the skin every 3 (three) months. (Patient not taking: Reported on 11/25/2021), Disp: , Rfl:    Semaglutide,0.25 or 0.'5MG'$ /DOS, 2 MG/3ML SOPN, Inject 0.'25mg'$  subcutaneously once weekly for 4 weeks, then increase to 0.'5mg'$  subcutaneously (Patient not taking: Reported on 01/31/2022), Disp: 3 mL, Rfl: 1   venlafaxine XR (EFFEXOR-XR) 150 MG 24 hr capsule, TAKE 2 CAPSULES AT BEDTIME, Disp: 180 capsule, Rfl: 1 Medication Side Effects: none  Family Medical/ Social History: Changes?  no  MENTAL HEALTH EXAM:  There were no vitals taken for this visit.There is no height or weight on file to calculate BMI.  General Appearance: Casual, Well Groomed, and Obese  Eye Contact:  Good  Speech:  Clear and Coherent, Pressured, and Talkative  Volume:  Normal  Mood:  Euphoric  Affect:  Congruent       Thought Process:  Goal Directed and Descriptions of Associations: Circumstantial  Orientation:  Full (Time, Place, and Person)  Thought Content: Logical   Suicidal Thoughts:  No  Homicidal Thoughts:  No  Memory:  Immediate;   Good and Fair Recent;   Good Remote;   Good  Judgement:  Good  Insight:  Good  Psychomotor Activity:  Normal   Concentration:  Concentration: Good and Attention Span: Good  Recall:  Good  Fund of Knowledge: Good  Language: Good  Assets:  Desire for Improvement Financial Resources/Insurance Housing Social Support Transportation  ADL's:  Intact  Cognition: WNL  Prognosis:  Good   DIAGNOSES:    ICD-10-CM   1. Bipolar 1 disorder with moderate mania ([facility])  F31.12 CBC with Differential/Platelet    Comprehensive metabolic panel    [id number]    Hemoglobin A1c    TSH    2. Generalized anxiety disorder  F41.1     3. Insomnia due to other mental disorder  F51.05    F99     4. Malaise and fatigue  R53.81 CBC with Differential/Platelet   R53.83 Comprehensive metabolic panel    Hemoglobin A1c    TSH    5. Encounter for long-term (current) use of medications  Z79.899 CBC with Differential/Platelet    Comprehensive metabolic panel  10-Hydroxycarbazepine    Hemoglobin A1c    TSH      Receiving Psychotherapy: No   RECOMMENDATIONS:  PDMP reviewed. Last Xanax 06/19/2022. I provided 40 minutes of face to face time during this encounter, including time spent before and after the visit in records review, medical decision making, counseling pertinent to today's visit, and charting.   Her symptoms of elation may be secondary to happiness related to her granddaughter's birthday party.  However [name] and their daughter feel that her manic episode last year started in the same way and they would like to treat before it gets worse, if it is a manic episode that is coming on.  I completely agree. She has prolonged QT interval so we cannot use Seroquel.  She took Risperdal last year but had side effects from that.  I recommend increasing the Trileptal.  We will get labs drawn today for evaluation and then tonight go ahead and start the increase.  They understand.  Continue Xanax 0.25 mg, 1 p.o. nightly as needed sleep or anxiety. Increase Trileptal to 600 mg, 1 p.o. twice daily. Continue  Effexor XR 150 mg, 2 p.o. daily plus Effexor XR 75 mg, 1 p.o. daily. Continue NAC, multivitamin, B complex, fish oil, vitamin D 2000 IUs daily, ginkgo biloba. Labs ordered as above. Return in 2 weeks.  Avoid drugs that are known to cause prolonged QT interval  Donnal Moat, PA-C

## 2022-07-31 ENCOUNTER — Encounter: Payer: Self-pay | Admitting: Physician Assistant

## 2022-08-05 ENCOUNTER — Other Ambulatory Visit: Payer: Self-pay

## 2022-08-05 MED ORDER — ALPRAZOLAM 0.25 MG PO TABS: 0.1250 mg | ORAL_TABLET | Freq: Three times a day (TID) | ORAL | 1 refills | Status: DC | PRN

## 2022-08-05 NOTE — Progress Notes (Signed)
Please call the lab and see if the Trileptal level is back. Thanks

## 2022-08-05 NOTE — Progress Notes (Signed)
Results should be resulted today. Will fax and send via interface when available.

## 2022-08-06 ENCOUNTER — Telehealth: Payer: Self-pay | Admitting: Physician Assistant

## 2022-08-06 LAB — CBC WITH DIFFERENTIAL/PLATELET
Basophils Absolute: 0.1 10*3/uL (ref 0.0–0.2)
Basos: 1 %
EOS (ABSOLUTE): 0.1 10*3/uL (ref 0.0–0.4)
Eos: 1 %
Hematocrit: 35.5 % (ref 34.0–46.6)
Hemoglobin: 11.4 g/dL (ref 11.1–15.9)
Immature Grans (Abs): 0 10*3/uL (ref 0.0–0.1)
Immature Granulocytes: 0 %
Lymphocytes Absolute: 1.1 10*3/uL (ref 0.7–3.1)
Lymphs: 12 %
MCH: 31.5 pg (ref 26.6–33.0)
MCHC: 32.1 g/dL (ref 31.5–35.7)
MCV: 98 fL — ABNORMAL HIGH (ref 79–97)
Monocytes Absolute: 0.7 10*3/uL (ref 0.1–0.9)
Monocytes: 8 %
Neutrophils Absolute: 7.3 10*3/uL — ABNORMAL HIGH (ref 1.4–7.0)
Neutrophils: 78 %
Platelets: 471 10*3/uL — ABNORMAL HIGH (ref 150–450)
RBC: 3.62 x10E6/uL — ABNORMAL LOW (ref 3.77–5.28)
RDW: 13.1 % (ref 11.7–15.4)
WBC: 9.4 10*3/uL (ref 3.4–10.8)

## 2022-08-06 LAB — COMPREHENSIVE METABOLIC PANEL
ALT: 23 IU/L (ref 0–32)
AST: 16 IU/L (ref 0–40)
Albumin/Globulin Ratio: 1.4 (ref 1.2–2.2)
Albumin: 4 g/dL (ref 3.9–4.9)
Alkaline Phosphatase: 163 IU/L — ABNORMAL HIGH (ref 44–121)
BUN/Creatinine Ratio: 23 (ref 12–28)
BUN: 34 mg/dL — ABNORMAL HIGH (ref 8–27)
Bilirubin Total: <0.2 mg/dL (ref 0.0–1.2)
CO2: 23 mmol/L (ref 20–29)
Calcium: 9.7 mg/dL (ref 8.7–10.3)
Chloride: 102 mmol/L (ref 96–106)
Creatinine, Ser: 1.49 mg/dL — ABNORMAL HIGH (ref 0.57–1.00)
Globulin, Total: 2.8 g/dL (ref 1.5–4.5)
Glucose: 75 mg/dL (ref 70–99)
Potassium: 4.5 mmol/L (ref 3.5–5.2)
Sodium: 141 mmol/L (ref 134–144)
Total Protein: 6.8 g/dL (ref 6.0–8.5)
eGFR: 39 mL/min/{1.73_m2} — ABNORMAL LOW (ref >59)

## 2022-08-06 LAB — HEMOGLOBIN A1C
Est. average glucose Bld gHb Est-mCnc: 137 mg/dL
Hgb A1c MFr Bld: 6.4 % — ABNORMAL HIGH (ref 4.8–5.6)

## 2022-08-06 LAB — TSH: TSH: 2.42 u[IU]/mL (ref 0.450–4.500)

## 2022-08-06 LAB — 10-HYDROXYCARBAZEPINE: Oxcarbazepine SerPl-Mcnc: 33 ug/mL (ref 10–35)

## 2022-08-06 NOTE — Telephone Encounter (Signed)
Appt 2/8

## 2022-08-06 NOTE — Telephone Encounter (Signed)
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 She thinks she would like to try it. Wants your advice.

## 2022-08-06 NOTE — Progress Notes (Signed)
Office Visit    Patient Name: Marie Ramos Date of Encounter: 08/07/2022  PCP:  Lawerance Cruel, [facility]  Cardiologist:  [name], MD  Advanced Practice Provider:  No care team member to display Electrophysiologist:  None    HPI    Marie Ramos is a 67 y.o. female with a past medical history of chronic diastolic congestive heart failure, stage IIIa chronic kidney disease, cardiac arrest, bipolar disorder, essential hypertension, CVA, and BMI 45-49 presents today for follow-up appointment.  Sometimes when she walks to the car she is SOB. She has a big bruise on her left upper arm from a fall. She has considered using a cane before for balance and I encouraged this if she is leaving the house.  No issues with her medication. Encouraged a low sodium diet. She does suffer from bipolar disorder but it has been controlled on medications.   Reports no shortness of breath nor dyspnea on exertion. Reports no chest pain, pressure, or tightness. No edema, orthopnea, PND. Reports no palpitations.   Past Medical History    Past Medical History:  Diagnosis Date   Abnormality of gait    Acute bilat watershed infarction [facility])    Acute lower UTI    Acute metabolic encephalopathy 09/09/5730   Acute respiratory failure (HCC)    Acute systolic congestive heart failure (HCC)    AKI (acute kidney injury) ([place])    Anoxic brain damage (HCC)    Bipolar 1 disorder (HCC)    Bipolar affective disorder in remission ([place])    Cardiac arrest ([place]) 08/24/2015   Cerebral infarction due to embolism of cerebral artery (HCC)    Cerebrovascular accident (CVA) due to embolism of cerebral artery ([place]) 12/05/2015   Chronically dry eyes    Closed fracture of left distal radius 12/01/2018   DVT (deep venous thrombosis) ([place]) 12/05/2015   Dyspnea    r/t seasonal allergies   Hypertrophic obstructive cardiomyopathy ([place]) 08/29/2015   Leukocytosis    Macular degeneration     Pneumonia due to COVID-19 virus 08/16/2019   Pulmonary vascular congestion 08/16/2019   QT prolongation 08/29/2015   Stroke ([place])    Systolic dysfunction with acute on chronic heart failure ([place])    TBI (traumatic brain injury) ([place]) 2011   Inpatient rehab [facility]   Thrombocytosis    Ventricular fibrillation ([place]) 09/03/15   MDT ICD Dr. Rayann Heman   Past Surgical History:  Procedure Laterality Date   ABDOMINAL HYSTERECTOMY     CARDIAC CATHETERIZATION N/A 08/24/2015   Procedure: Left Heart Cath and Coronary Angiography;  Surgeon: Sherren Mocha, MD;  Location: [facility];  Service: Cardiovascular;  Laterality: N/A;   EP IMPLANTABLE DEVICE N/A 09/03/2015   MDT dual chamber ICD   OPEN REDUCTION INTERNAL FIXATION (ORIF) DISTAL RADIAL FRACTURE Left 12/01/2018   Procedure: OPEN REDUCTION INTERNAL FIXATION (ORIF) LEFT DISTAL RADIAL FRACTURE;  Surgeon: Hiram Gash, MD;  Location: [facility];  Service: Orthopedics;  Laterality: Left;   SPLENECTOMY, TOTAL  2011   TONSILLECTOMY      Allergies  Allergies  Allergen Reactions   Sulfa Antibiotics Itching    Face neck red   Albuterol Itching   Other Hives   Sulfamethoxazole-Trimethoprim Hives   Vicodin Hp [Hydrocodone-Acetaminophen] Hives   Naproxen Itching   Vicodin [Hydrocodone-Acetaminophen] Itching     EKGs/Labs/Other Studies Reviewed:   The following studies were reviewed today:  Prior Studies:   2022 carotid  ultrasound with mild disease bilaterally   2022 TTE ejection fraction of 65 to 70% with left ventricular hypertrophy of basal septum and no significant valvular abnormalities   2017 coronary angiography with no obstructive disease   2017 cardiac MRI with no late gadolinium enhancement with ejection fraction of 45% and moderate concentric hypertrophy  EKG:  EKG ordered today and went to Dr. Curt Bears  Recent Labs: 10/10/2021: Magnesium 2.5 07/30/2022: ALT 23; BUN 34; Creatinine, Ser 1.49; Hemoglobin 11.4; Platelets 471;  Potassium 4.5; Sodium 141; TSH 2.420  Recent Lipid Panel    Component Value Date/Time   CHOL 225 (H) 09/03/2015 0248   TRIG 128 08/16/2019 1654   HDL 43 09/03/2015 0248   CHOLHDL 5.2 09/03/2015 0248   VLDL 67 (H) 09/03/2015 0248   LDLCALC 115 (H) 09/03/2015 0248    Home Medications   Current Meds  Medication Sig   Acetylcysteine (NAC) 600 MG CAPS Take 1 capsule (600 mg total) by mouth daily.   ALPRAZolam (XANAX) 0.25 MG tablet Take 0.5-1 tablets (0.125-0.25 mg total) by mouth 3 (three) times daily as needed for anxiety.   amLODipine (NORVASC) 5 MG tablet Take 1 tablet (5 mg total) by mouth daily with breakfast.   Ascorbic Acid (VITAMIN C PO) Take 1 capsule by mouth daily.   aspirin EC 81 MG tablet Take 1 tablet (81 mg total) by mouth daily. Swallow whole.   atorvastatin (LIPITOR) 20 MG tablet Take 1 tablet (20 mg total) by mouth daily at 6 PM.   carvedilol (COREG) 12.5 MG tablet TAKE ONE TABLET BY MOUTH TWICE DAILY WITH BREAKFAST AND DINNER   cholecalciferol (VITAMIN D3) 25 MCG (1000 UNIT) tablet Take 2,000 Units by mouth daily.   famotidine (PEPCID) 20 MG tablet Take 20 mg by mouth at bedtime.   fexofenadine (ALLEGRA) 180 MG tablet Take 180 mg by mouth daily.   fluticasone (FLONASE) 50 MCG/ACT nasal spray Place 1 spray into both nostrils daily. Use as directed   furosemide (LASIX) 40 MG tablet Take 1 tablet (40 mg total) by mouth daily.   Ginkgo Biloba 40 MG TABS Take 1 tablet (40 mg total) by mouth 2 (two) times daily.   losartan (COZAAR) 100 MG tablet Take 1 tablet (100 mg total) by mouth daily.   Multiple Vitamin (MULTIVITAMIN WITH MINERALS) TABS tablet Take 1 tablet by mouth daily.   Multiple Vitamins-Minerals (OCUVITE PO) Take 1 capsule by mouth daily.   oxcarbazepine (TRILEPTAL) 600 MG tablet Take 1 tablet (600 mg total) by mouth 2 (two) times daily.   Risankizumab-rzaa (SKYRIZI, 150 MG DOSE, ) Inject into the skin every 3 (three) months.   triamcinolone ointment (KENALOG)  0.5 % Apply 1 application topically as needed.   venlafaxine XR (EFFEXOR XR) 75 MG 24 hr capsule Take 1 capsule (75 mg total) by mouth daily with breakfast. Take with Effexor XR 300 mg   venlafaxine XR (EFFEXOR-XR) 150 MG 24 hr capsule TAKE 2 CAPSULES AT BEDTIME     Review of Systems      All other systems reviewed and are otherwise negative except as noted above.  Physical Exam    VS:  BP 138/70   Pulse 76   Ht '5\' 2"'$  (1.575 m)   Wt 255 lb (115.7 kg)   SpO2 94%   BMI 46.64 kg/m  , BMI Body mass index is 46.64 kg/m.  Wt Readings from Last 3 Encounters:  08/07/22 255 lb (115.7 kg)  11/29/21 274 lb (124.3 kg)  11/08/21 268  lb (121.6 kg)     GEN: Well nourished, well developed, in no acute distress. HEENT: normal. Neck: Supple, no JVD, carotid bruits, or masses. Cardiac: RRR, no murmurs, rubs, or gallops. No clubbing, cyanosis, edema.  Radials/PT 2+ and equal bilaterally.  Respiratory:  Respirations regular and unlabored, clear to auscultation bilaterally. GI: Soft, nontender, nondistended. MS: No deformity or atrophy. Skin: Warm and dry, no rash. Neuro:  Strength and sensation are intact. Psych: Normal affect.  Assessment & Plan    Chronic diastolic congestive heart failure -no swelling in her legs and does have some SOB with ambulation -will update an echo -she has not been driving yet  Cardiac arrest s/p ICD -She has an appointment with Dr. Curt Bears later today   Essential hypertension -Continue current medications including amlodipine 5 mg daily, aspirin 81 mg daily, Lipitor 20 mg daily, Coreg 12.5 mg twice daily, Lasix 40 mg daily, Cozaar 100 mg daily   BMI 45-49 -encouraged ambulation 30 minutes a day x 5 days a week         Disposition: Follow up 3 months with [name], MD or APP.  Signed, Elgie Collard, PA-C 08/07/2022, 3:31 PM [place]

## 2022-08-07 ENCOUNTER — Ambulatory Visit: Payer: Medicare HMO | Attending: Cardiology | Admitting: Cardiology

## 2022-08-07 ENCOUNTER — Encounter: Payer: Self-pay | Admitting: Physician Assistant

## 2022-08-07 ENCOUNTER — Encounter: Payer: Self-pay | Admitting: Cardiology

## 2022-08-07 ENCOUNTER — Ambulatory Visit: Payer: Medicare HMO | Admitting: Physician Assistant

## 2022-08-07 VITALS — BP 138/70 | HR 76 | Ht 62.0 in | Wt 255.0 lb

## 2022-08-07 DIAGNOSIS — Z6841 Body Mass Index (BMI) 40.0 and over, adult: Secondary | ICD-10-CM

## 2022-08-07 DIAGNOSIS — I4891 Unspecified atrial fibrillation: Secondary | ICD-10-CM | POA: Diagnosis not present

## 2022-08-07 DIAGNOSIS — I6340 Cerebral infarction due to embolism of unspecified cerebral artery: Secondary | ICD-10-CM

## 2022-08-07 DIAGNOSIS — I10 Essential (primary) hypertension: Secondary | ICD-10-CM

## 2022-08-07 DIAGNOSIS — N1831 Chronic kidney disease, stage 3a: Secondary | ICD-10-CM

## 2022-08-07 DIAGNOSIS — R0609 Other forms of dyspnea: Secondary | ICD-10-CM

## 2022-08-07 DIAGNOSIS — I4901 Ventricular fibrillation: Secondary | ICD-10-CM | POA: Diagnosis not present

## 2022-08-07 NOTE — Patient Instructions (Signed)
Medication Instructions:  Your physician recommends that you continue on your current medications as directed. Please refer to the Current Medication list given to you today.  *If you need a refill on your cardiac medications before your next appointment, please call your pharmacy*   Lab Work: None ordered If you have labs (blood work) drawn today and your tests are completely normal, you will receive your results only by: [place] (if you have MyChart) OR A paper copy in the mail If you have any lab test that is abnormal or we need to change your treatment, we will call you to review the results.   Testing/Procedures: Your physician has requested that you have an echocardiogram. Echocardiography is a painless test that uses sound waves to create images of your heart. It provides your doctor with information about the size and shape of your heart and how well your heart's chambers and valves are working. This procedure takes approximately one hour. There are no restrictions for this procedure. Please do NOT wear cologne, perfume, aftershave, or lotions (deodorant is allowed). Please arrive 15 minutes prior to your appointment time.    Follow-Up: At [facility], you and your health needs are our priority.  As part of our continuing mission to provide you with exceptional heart care, we have created designated Provider Care Teams.  These Care Teams include your primary Cardiologist (physician) and Advanced Practice Providers (APPs -  Physician Assistants and Nurse Practitioners) who all work together to provide you with the care you need, when you need it.   Your next appointment:   3 month(s)  Provider:   [name], MD

## 2022-08-07 NOTE — Progress Notes (Signed)
Please let her know the Trileptal level is the upper limit of normal which is fine.  Continue the current Trileptal dose. Her BUN and creatinine, as well as GFR are abnormal.  Slightly worse since they were drawn in May of last year.  Also alkaline phosphatase is elevated some but not a lot.  Have her see her PCP as soon as she can to go over these labs and formulate a treatment plan. CBC is stable, hemoglobin A1c is 6.4 which is elevated, prediabetic range, TSH is normal.  Thanks.

## 2022-08-07 NOTE — Progress Notes (Signed)
Electrophysiology Office Note   Date:  08/07/2022   ID:  Marie, Ramos March 17, 1956, MRN [id number]  PCP:  Lawerance Cruel, MD  Cardiologist:  Ali Lowe Primary Electrophysiologist:  Hillarie Harrigan Meredith Leeds, MD    Chief Complaint: ICD   History of Present Illness: Marie Ramos is a 67 y.o. female who is being seen today for the evaluation of ICD at the request of Lawerance Cruel, MD. Presenting today for electrophysiology evaluation.  She has a history significant for bipolar 1 disorder, cardiac arrest, DVT, hypertrophic cardiomyopathy, CVA.  She is status post Medtronic ICD implanted 09/03/2015.  Today, she denies symptoms of palpitations, chest pain, shortness of breath, orthopnea, PND, lower extremity edema, claudication, dizziness, presyncope, syncope, bleeding, or neurologic sequela. The patient is tolerating medications without difficulties.    Past Medical History:  Diagnosis Date   Abnormality of gait    Acute bilat watershed infarction [facility])    Acute lower UTI    Acute metabolic encephalopathy 7/0/3500   Acute respiratory failure (HCC)    Acute systolic congestive heart failure (HCC)    AKI (acute kidney injury) (HCC)    Anoxic brain damage (HCC)    Bipolar 1 disorder (HCC)    Bipolar affective disorder in remission ([place])    Cardiac arrest ([place]) 08/24/2015   Cerebral infarction due to embolism of cerebral artery (HCC)    Cerebrovascular accident (CVA) due to embolism of cerebral artery ([place]) 12/05/2015   Chronically dry eyes    Closed fracture of left distal radius 12/01/2018   DVT (deep venous thrombosis) ([place]) 12/05/2015   Dyspnea    r/t seasonal allergies   Hypertrophic obstructive cardiomyopathy ([place]) 08/29/2015   Leukocytosis    Macular degeneration    Pneumonia due to COVID-19 virus 08/16/2019   Pulmonary vascular congestion 08/16/2019   QT prolongation 08/29/2015   Stroke ([place])    Systolic dysfunction with acute on chronic heart failure ([place])    TBI  (traumatic brain injury) ([place]) 2011   Inpatient rehab [facility]   Thrombocytosis    Ventricular fibrillation ([place]) 09/03/15   MDT ICD Dr. Rayann Heman   Past Surgical History:  Procedure Laterality Date   ABDOMINAL HYSTERECTOMY     CARDIAC CATHETERIZATION N/A 08/24/2015   Procedure: Left Heart Cath and Coronary Angiography;  Surgeon: Sherren Mocha, MD;  Location: [facility];  Service: Cardiovascular;  Laterality: N/A;   EP IMPLANTABLE DEVICE N/A 09/03/2015   MDT dual chamber ICD   OPEN REDUCTION INTERNAL FIXATION (ORIF) DISTAL RADIAL FRACTURE Left 12/01/2018   Procedure: OPEN REDUCTION INTERNAL FIXATION (ORIF) LEFT DISTAL RADIAL FRACTURE;  Surgeon: Hiram Gash, MD;  Location: [facility];  Service: Orthopedics;  Laterality: Left;   SPLENECTOMY, TOTAL  2011   TONSILLECTOMY       Current Outpatient Medications  Medication Sig Dispense Refill   Acetylcysteine (NAC) 600 MG CAPS Take 1 capsule (600 mg total) by mouth daily. 30 capsule 11   ALPRAZolam (XANAX) 0.25 MG tablet Take 0.5-1 tablets (0.125-0.25 mg total) by mouth 3 (three) times daily as needed for anxiety. 90 tablet 1   amLODipine (NORVASC) 5 MG tablet Take 1 tablet (5 mg total) by mouth daily with breakfast. 30 tablet 0   Ascorbic Acid (VITAMIN C PO) Take 1 capsule by mouth daily.     aspirin EC 81 MG tablet Take 1 tablet (81 mg total) by mouth daily. Swallow whole. 90 tablet 3   atorvastatin (LIPITOR) 20 MG tablet Take 1 tablet (  20 mg total) by mouth daily at 6 PM. 30 tablet 0   carvedilol (COREG) 12.5 MG tablet TAKE ONE TABLET BY MOUTH TWICE DAILY WITH BREAKFAST AND DINNER 180 tablet 2   cholecalciferol (VITAMIN D3) 25 MCG (1000 UNIT) tablet Take 2,000 Units by mouth daily.     famotidine (PEPCID) 20 MG tablet Take 20 mg by mouth at bedtime.     fexofenadine (ALLEGRA) 180 MG tablet Take 180 mg by mouth daily.     fluticasone (FLONASE) 50 MCG/ACT nasal spray Place 1 spray into both nostrils daily. Use as directed     furosemide (LASIX)  40 MG tablet Take 1 tablet (40 mg total) by mouth daily. 90 tablet 3   Ginkgo Biloba 40 MG TABS Take 1 tablet (40 mg total) by mouth 2 (two) times daily. 180 tablet 11   losartan (COZAAR) 100 MG tablet Take 1 tablet (100 mg total) by mouth daily. 90 tablet 3   Multiple Vitamin (MULTIVITAMIN WITH MINERALS) TABS tablet Take 1 tablet by mouth daily.     Multiple Vitamins-Minerals (OCUVITE PO) Take 1 capsule by mouth daily.     oxcarbazepine (TRILEPTAL) 600 MG tablet Take 1 tablet (600 mg total) by mouth 2 (two) times daily. 60 tablet 1   Risankizumab-rzaa (SKYRIZI, 150 MG DOSE, [place]) Inject into the skin every 3 (three) months.     triamcinolone ointment (KENALOG) 0.5 % Apply 1 application topically as needed.  0   venlafaxine XR (EFFEXOR XR) 75 MG 24 hr capsule Take 1 capsule (75 mg total) by mouth daily with breakfast. Take with Effexor XR 300 mg 90 capsule 1   venlafaxine XR (EFFEXOR-XR) 150 MG 24 hr capsule TAKE 2 CAPSULES AT BEDTIME 180 capsule 1   No current facility-administered medications for this visit.    Allergies:   Sulfa antibiotics, Albuterol, Other, Sulfamethoxazole-trimethoprim, Vicodin hp [hydrocodone-acetaminophen], Naproxen, and Vicodin [hydrocodone-acetaminophen]   Social History:  The patient  reports that she has never smoked. She has never used smokeless tobacco. She reports that she does not drink alcohol and does not use drugs.   Family History:  The patient's family history includes Arthritis/Rheumatoid in her paternal grandmother; CAD in her mother; COPD in her father; Cancer in her mother; Depression in her father, maternal grandfather, and mother; Diabetes in her maternal grandfather.    ROS:  Please see the history of present illness.   Otherwise, review of systems is positive for none.   All other systems are reviewed and negative.    PHYSICAL EXAM: VS:  BP 138/70   Pulse 76   Ht '5\' 2"'$  (1.575 m)   Wt 255 lb (115.7 kg)   SpO2 94%   BMI 46.64 kg/m  , BMI Body  mass index is 46.64 kg/m. GEN: Well nourished, well developed, in no acute distress  HEENT: normal  Neck: no JVD, carotid bruits, or masses Cardiac: RRR; no murmurs, rubs, or gallops,no edema  Respiratory:  clear to auscultation bilaterally, normal work of breathing GI: soft, nontender, nondistended, + BS MS: no deformity or atrophy  Skin: warm and dry, device pocket is well healed Neuro:  Strength and sensation are intact Psych: euthymic mood, full affect  EKG:  EKG is ordered today. Personal review of the ekg ordered shows sinus rhythm, first-degree AV block, QTc 463  Device interrogation is reviewed today in detail.  See PaceArt for details.   Recent Labs: 10/10/2021: Magnesium 2.5 07/30/2022: ALT 23; BUN 34; Creatinine, Ser 1.49; Hemoglobin 11.4; Platelets 471;  Potassium 4.5; Sodium 141; TSH 2.420    Lipid Panel     Component Value Date/Time   CHOL 225 (H) 09/03/2015 0248   TRIG 128 08/16/2019 1654   HDL 43 09/03/2015 0248   CHOLHDL 5.2 09/03/2015 0248   VLDL 67 (H) 09/03/2015 0248   LDLCALC 115 (H) 09/03/2015 0248     Wt Readings from Last 3 Encounters:  08/07/22 255 lb (115.7 kg)  08/07/22 255 lb (115.7 kg)  11/29/21 274 lb (124.3 kg)      Other studies Reviewed: Additional studies/ records that were reviewed today include: TTE 08/17/19  Review of the above records today demonstrates:   1. Left ventricular ejection fraction, by estimation, is 65 to 70%. The  left ventricle has normal function. There is moderately increased left  ventricular hypertrophy of the basal septum. LV diastolic function not  assessed.   2. Right ventricular systolic function was not well visualized. The right  ventricular size is not well visualized. Tricuspid regurgitation signal is  inadequate for assessing PA pressure.   3. The mitral valve is normal in structure and function. no evidence of  mitral valve regurgitation. No evidence of mitral stenosis.   4. The aortic valve was not  well visualized. Aortic valve regurgitation  is trivial.   5. The inferior vena cava is normal in size with <50% respiratory  variability, suggesting right atrial pressure of 8 mmHg.   6. No left atrial/left atrial appendage thrombus was detected.    ASSESSMENT AND PLAN:  1.  Prior VF arrest with long QT syndrome: Status post Medtronic ICD.  Currently on carvedilol.  2.  Hypertension: well controlled  3.  Bipolar disorder: Needs caution with QT prolonging medications.  4.  Obesity: Lifestyle modification encouraged Body mass index is 46.64 kg/m.  5.  Subclinical atrial fibrillation: Had a stroke but with rare atrial fibrillation that has been short-lived.  Marie Ramos continue to monitor and if further episodes Marie Ramos likely need anticoagulation.  Current medicines are reviewed at length with the patient today.   The patient does not have concerns regarding her medicines.  The following changes were made today:  none  Labs/ tests ordered today include:  No orders of the defined types were placed in this encounter.    Disposition:   FU 1 year  Signed, Melora Menon Meredith Leeds, MD  08/07/2022 4:45 PM     [place] [address] [place] [address] [phone] (office) [phone] (fax)

## 2022-08-07 NOTE — Patient Instructions (Signed)
Medication Instructions:  Your physician recommends that you continue on your current medications as directed. Please refer to the Current Medication list given to you today.  *If you need a refill on your cardiac medications before your next appointment, please call your pharmacy*   Lab Work: None ordered   Testing/Procedures: None ordered   Follow-Up: At [facility], you and your health needs are our priority.  As part of our continuing mission to provide you with exceptional heart care, we have created designated Provider Care Teams.  These Care Teams include your primary Cardiologist (physician) and Advanced Practice Providers (APPs -  Physician Assistants and Nurse Practitioners) who all work together to provide you with the care you need, when you need it.  Remote monitoring is used to monitor your Pacemaker or ICD from home. This monitoring reduces the number of office visits required to check your device to one time per year. It allows [name] to keep an eye on the functioning of your device to ensure it is working properly. You are scheduled for a device check from home on 08/14/22. You may send your transmission at any time that day. If you have a wireless device, the transmission will be sent automatically. After your physician reviews your transmission, you will receive a postcard with your next transmission date.  Your next appointment:   1 year(s)  The format for your next appointment:   In Person  Provider:   You will see one of the following Advanced Practice Providers on your designated Care Team:   Tommye Standard, [place] Legrand Como "Jonni Sanger" Chalmers Cater, [place]  Thank you for choosing [facility]!!   Trinidad Curet, RN [phone]

## 2022-08-14 ENCOUNTER — Encounter (HOSPITAL_COMMUNITY): Payer: Self-pay | Admitting: *Deleted

## 2022-08-14 ENCOUNTER — Ambulatory Visit (INDEPENDENT_AMBULATORY_CARE_PROVIDER_SITE_OTHER): Payer: Medicare HMO | Admitting: Physician Assistant

## 2022-08-14 ENCOUNTER — Ambulatory Visit (INDEPENDENT_AMBULATORY_CARE_PROVIDER_SITE_OTHER): Payer: Medicare HMO

## 2022-08-14 DIAGNOSIS — I469 Cardiac arrest, cause unspecified: Secondary | ICD-10-CM

## 2022-08-14 LAB — CUP PACEART REMOTE DEVICE CHECK
Battery Remaining Longevity: 34 mo
Battery Voltage: 2.95 V
Brady Statistic AP VP Percent: 0 %
Brady Statistic AP VS Percent: 0.03 %
Brady Statistic AS VP Percent: 0.04 %
Brady Statistic AS VS Percent: 99.92 %
Brady Statistic RA Percent Paced: 0.04 %
Brady Statistic RV Percent Paced: 0.04 %
Date Time Interrogation Session: 20240208012405
HighPow Impedance: 93 Ohm
Implantable Lead Connection Status: 753985
Implantable Lead Connection Status: 753985
Implantable Lead Implant Date: 20170227
Implantable Lead Implant Date: 20170227
Implantable Lead Location: 753859
Implantable Lead Location: 753860
Implantable Lead Model: 5076
Implantable Pulse Generator Implant Date: 20170227
Lead Channel Impedance Value: 399 Ohm
Lead Channel Impedance Value: 418 Ohm
Lead Channel Impedance Value: 513 Ohm
Lead Channel Pacing Threshold Amplitude: 0.625 V
Lead Channel Pacing Threshold Amplitude: 0.625 V
Lead Channel Pacing Threshold Pulse Width: 0.4 ms
Lead Channel Pacing Threshold Pulse Width: 0.4 ms
Lead Channel Sensing Intrinsic Amplitude: 2.625 mV
Lead Channel Sensing Intrinsic Amplitude: 2.625 mV
Lead Channel Sensing Intrinsic Amplitude: 24.5 mV
Lead Channel Sensing Intrinsic Amplitude: 24.5 mV
Lead Channel Setting Pacing Amplitude: 2 V
Lead Channel Setting Pacing Amplitude: 2.5 V
Lead Channel Setting Pacing Pulse Width: 0.4 ms
Lead Channel Setting Sensing Sensitivity: 0.3 mV
Zone Setting Status: 755011
Zone Setting Status: 755011

## 2022-08-19 DIAGNOSIS — N1831 Chronic kidney disease, stage 3a: Secondary | ICD-10-CM | POA: Diagnosis not present

## 2022-08-19 DIAGNOSIS — Z6841 Body Mass Index (BMI) 40.0 and over, adult: Secondary | ICD-10-CM | POA: Diagnosis not present

## 2022-08-19 DIAGNOSIS — R2681 Unsteadiness on feet: Secondary | ICD-10-CM | POA: Diagnosis not present

## 2022-08-19 DIAGNOSIS — S40022A Contusion of left upper arm, initial encounter: Secondary | ICD-10-CM | POA: Diagnosis not present

## 2022-08-21 ENCOUNTER — Encounter: Payer: Self-pay | Admitting: Physician Assistant

## 2022-08-21 ENCOUNTER — Ambulatory Visit (INDEPENDENT_AMBULATORY_CARE_PROVIDER_SITE_OTHER): Payer: Medicare HMO | Admitting: Physician Assistant

## 2022-08-21 DIAGNOSIS — F99 Mental disorder, not otherwise specified: Secondary | ICD-10-CM

## 2022-08-21 DIAGNOSIS — F319 Bipolar disorder, unspecified: Secondary | ICD-10-CM | POA: Diagnosis not present

## 2022-08-21 DIAGNOSIS — F5105 Insomnia due to other mental disorder: Secondary | ICD-10-CM

## 2022-08-21 DIAGNOSIS — F411 Generalized anxiety disorder: Secondary | ICD-10-CM | POA: Diagnosis not present

## 2022-08-21 DIAGNOSIS — R9431 Abnormal electrocardiogram [ECG] [EKG]: Secondary | ICD-10-CM | POA: Diagnosis not present

## 2022-08-21 MED ORDER — OXCARBAZEPINE 300 MG PO TABS: ORAL_TABLET | ORAL | 1 refills | Status: DC

## 2022-08-21 NOTE — Progress Notes (Signed)
[facility]  Patient ID: Marie Ramos,  MRN: [id number]  PCP: Lawerance Cruel, MD  Date of Evaluation: 08/21/2022 Time spent:20 minutes  Chief Complaint:  Chief Complaint   Follow-up    HISTORY/CURRENT STATUS: For routine med check. [name], husband, is with her.   Trileptal was increased last month. It's working but 'maybe too good.' No longer has the elation she was feeling before then. Since adding this, she's been more 'dull.' Has started baking again.  Energy and motivation are fair to good.  No extreme sadness, tearfulness, or feelings of hopelessness.  Sleeps well.  ADLs and personal hygiene are normal.   Denies any changes in concentration, making decisions, or remembering things.  Appetite has not changed.  Weight is stable.   Xanax helps w/ any anxiety she has.  Denies suicidal or homicidal thoughts.  No manic sx now.  Patient denies increased energy with decreased need for sleep, increased talkativeness, racing thoughts, impulsivity or risky behaviors, increased spending, increased libido, grandiosity, increased irritability or anger, paranoia, or hallucinations.  Denies dizziness, syncope, seizures, numbness, tingling, tremor, tics, unsteady gait, slurred speech, confusion. Denies muscle or joint pain, stiffness, or dystonia.  Individual Medical History/ Review of Systems: Changes? :No       Past medications for mental health diagnoses include: Lithium caused diarrhea and increased thirst, Trileptal, Effexor XR, Seroquel worked well, Abilify unknown what happened, Adderall, Risperdal helped mania but caused bilateral lower extremity edema and dry mouth.  Allergies: Sulfa antibiotics, Albuterol, Other, Sulfamethoxazole-trimethoprim, Vicodin hp [hydrocodone-acetaminophen], Naproxen, and Vicodin [hydrocodone-acetaminophen]  Current Medications:  Current Outpatient Medications:    Acetylcysteine (NAC) 600 MG CAPS, Take 1 capsule (600 mg total) by mouth  daily., Disp: 30 capsule, Rfl: 11   ALPRAZolam (XANAX) 0.25 MG tablet, Take 0.5-1 tablets (0.125-0.25 mg total) by mouth 3 (three) times daily as needed for anxiety., Disp: 90 tablet, Rfl: 1   amLODipine (NORVASC) 5 MG tablet, Take 1 tablet (5 mg total) by mouth daily with breakfast., Disp: 30 tablet, Rfl: 0   Ascorbic Acid (VITAMIN C PO), Take 1 capsule by mouth daily., Disp: , Rfl:    aspirin EC 81 MG tablet, Take 1 tablet (81 mg total) by mouth daily. Swallow whole., Disp: 90 tablet, Rfl: 3   atorvastatin (LIPITOR) 20 MG tablet, Take 1 tablet (20 mg total) by mouth daily at 6 PM., Disp: 30 tablet, Rfl: 0   carvedilol (COREG) 12.5 MG tablet, TAKE ONE TABLET BY MOUTH TWICE DAILY WITH BREAKFAST AND DINNER, Disp: 180 tablet, Rfl: 2   cholecalciferol (VITAMIN D3) 25 MCG (1000 UNIT) tablet, Take 2,000 Units by mouth daily., Disp: , Rfl:    famotidine (PEPCID) 20 MG tablet, Take 20 mg by mouth at bedtime., Disp: , Rfl:    fexofenadine (ALLEGRA) 180 MG tablet, Take 180 mg by mouth daily., Disp: , Rfl:    fluticasone (FLONASE) 50 MCG/ACT nasal spray, Place 1 spray into both nostrils daily. Use as directed, Disp: , Rfl:    furosemide (LASIX) 40 MG tablet, Take 1 tablet (40 mg total) by mouth daily., Disp: 90 tablet, Rfl: 3   Ginkgo Biloba 40 MG TABS, Take 1 tablet (40 mg total) by mouth 2 (two) times daily., Disp: 180 tablet, Rfl: 11   losartan (COZAAR) 100 MG tablet, Take 1 tablet (100 mg total) by mouth daily., Disp: 90 tablet, Rfl: 3   Multiple Vitamin (MULTIVITAMIN WITH MINERALS) TABS tablet, Take 1 tablet by mouth daily., Disp: , Rfl:  Multiple Vitamins-Minerals (OCUVITE PO), Take 1 capsule by mouth daily., Disp: , Rfl:    Oxcarbazepine (TRILEPTAL) 300 MG tablet, 2 po am, 1.5 pills in the evening, Disp: 105 tablet, Rfl: 1   triamcinolone ointment (KENALOG) 0.5 %, Apply 1 application topically as needed., Disp: , Rfl: 0   venlafaxine XR (EFFEXOR XR) 75 MG 24 hr capsule, Take 1 capsule (75 mg total)  by mouth daily with breakfast. Take with Effexor XR 300 mg, Disp: 90 capsule, Rfl: 1   venlafaxine XR (EFFEXOR-XR) 150 MG 24 hr capsule, TAKE 2 CAPSULES AT BEDTIME, Disp: 180 capsule, Rfl: 1   Risankizumab-rzaa (SKYRIZI, 150 MG DOSE, [place]), Inject into the skin every 3 (three) months. (Patient not taking: Reported on 08/21/2022), Disp: , Rfl:  Medication Side Effects: none  Family Medical/ Social History: Changes?  no  MENTAL HEALTH EXAM:  There were no vitals taken for this visit.There is no height or weight on file to calculate BMI.  General Appearance: Casual, Well Groomed, and Obese  Eye Contact:  Good  Speech:  Clear and Coherent, Pressured, and Talkative  Volume:  Normal  Mood:  Euphoric  Affect:  Congruent       Thought Process:  Goal Directed and Descriptions of Associations: Circumstantial  Orientation:  Full (Time, Place, and Person)  Thought Content: Logical   Suicidal Thoughts:  No  Homicidal Thoughts:  No  Memory:  Immediate;   Good and Fair Recent;   Good Remote;   Good  Judgement:  Good  Insight:  Good  Psychomotor Activity:  Normal  Concentration:  Concentration: Good and Attention Span: Good  Recall:  Good  Fund of Knowledge: Good  Language: Good  Assets:  Desire for Improvement Financial Resources/Insurance Housing Social Support Transportation  ADL's:  Intact  Cognition: WNL  Prognosis:  Good   DIAGNOSES:    ICD-10-CM   1. Bipolar I disorder ([place])  F31.9     2. Generalized anxiety disorder  F41.1     3. Insomnia due to other mental disorder  F51.05    F99     4. QT prolongation  R94.31       Receiving Psychotherapy: No   RECOMMENDATIONS:  PDMP reviewed. Last Xanax filled 08/05/2022. I provided 20 minutes of face to face time during this encounter, including time spent before and after the visit in records review, medical decision making, counseling pertinent to today's visit, and charting.   Discussed her mood and affect. Recommend decreasing  the Trileptal but only by a small amount. This may prevent emotional flattening. No other changes will be made. 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  No other changes.   Continue Xanax 0.25 mg, 1 p.o. nightly as needed sleep or anxiety. Decrease Trileptal to 300 mg, 2 q am, and 1.5 qhs (from 1200 mg daily to 1,050 mg daily) Continue Effexor XR 150 mg, 2 p.o. daily plus Effexor XR 75 mg, 1 p.o. daily. Continue NAC, multivitamin, B complex, fish oil, vitamin D 2000 IUs daily, ginkgo biloba. Labs ordered as above. Return in 4 weeks.  Avoid drugs that are known to cause prolonged QT interval  Donnal Moat, PA-C

## 2022-08-25 DIAGNOSIS — H02423 Myogenic ptosis of bilateral eyelids: Secondary | ICD-10-CM | POA: Diagnosis not present

## 2022-08-25 DIAGNOSIS — H16223 Keratoconjunctivitis sicca, not specified as Sjogren's, bilateral: Secondary | ICD-10-CM | POA: Diagnosis not present

## 2022-08-25 DIAGNOSIS — H2513 Age-related nuclear cataract, bilateral: Secondary | ICD-10-CM | POA: Diagnosis not present

## 2022-08-25 DIAGNOSIS — H353132 Nonexudative age-related macular degeneration, bilateral, intermediate dry stage: Secondary | ICD-10-CM | POA: Diagnosis not present

## 2022-08-26 ENCOUNTER — Ambulatory Visit: Payer: Medicare HMO | Attending: Cardiology

## 2022-08-26 DIAGNOSIS — Z95810 Presence of automatic (implantable) cardiac defibrillator: Secondary | ICD-10-CM | POA: Diagnosis not present

## 2022-08-26 DIAGNOSIS — I5032 Chronic diastolic (congestive) heart failure: Secondary | ICD-10-CM | POA: Diagnosis not present

## 2022-08-26 NOTE — Progress Notes (Unsigned)
EPIC Encounter for ICM Monitoring  Patient Name: Marie Ramos is a 67 y.o. female Date: 08/26/2022 Primary Care Physican: Lawerance Cruel, MD Primary Cardiologist: Ali Lowe Electrophysiologist: Curt Bears 11/25/2021 Weight: 261 lbs 12/18/2021 Weight: 261 lbs 04/03/2022 Weight: 264 lbs 05/21/2022 Weight: unknown   Time in AT/AF  0.0 hr/day (0.0%)          Attempted call to patient and unable to reach.  Left detailed message per DPR regarding transmission. Transmission reviewed.                   Diet:  Is not strict with limiting salt.  Eats restaurant foods couple of times a week.           Optivol thoracic impedance suggesting possible fluid accumulation starting 1/28.  Fluid index greater than normal threshold starting 08/09/22.   Prescribed:  Furosemide 40 mg Take 1 tablet (40 mg total) by mouth daily. Jardiance 10 mg take 1 tablet by mouth daily.   Labs: 11/27/2021 Creatinine 1.26, BUN 31, Potassium 5.0, Sodium 142, GFR 47 11/15/2021 Creatinine 1.05, BUN 41, Potassium 5.1, Sodium 139, GFR 59 10/10/2021 Creatinine 1.21, BUN 25, Potassium 5.2, Sodium 142 A complete set of results can be found in Results Review.   Recommendations:   Left voice mail with ICM number and encouraged to call if experiencing any fluid symptoms.  Will send copy sent to Nicholes Rough PA for review if patient is reached.    Follow-up plan: ICM clinic phone appointment on 09/01/2022 (manual) to recheck fluid levels.  91 day device clinic remote transmission 11/13/2022.      EP/Cardiology Office Visits:   Recall 07/21/2022 with Dr Curt Bears.  11/24/2022 with Dr Ali Lowe.   Copy of ICM check sent to Dr. Curt Bears.   3 month ICM trend: 08/26/2022.    12-14 Month ICM trend:     [name], RN 08/26/2022 9:52 AM

## 2022-08-27 NOTE — Progress Notes (Signed)
  Received: Today Constance Haw, MD  Sayde Lish Panda, RN If reachable, Lasix twice daily 40 mg for 4 days.

## 2022-08-27 NOTE — Progress Notes (Signed)
Spoke with patient and heart failure questions reviewed.  Transmission results reviewed.  Pt asymptomatic for fluid accumulation.  Patient reports a fall but no injury and now using a cane as needed.  She is losing weight and today's weight 254 lbs.  Advised Dr Camnitz's recommendation to take Furosemide 40 mg 1 tablet twice a day x 4 days.  She verbalized understanding and repeated back correctly.   Will recheck fluid levels on 2/26.

## 2022-09-01 ENCOUNTER — Ambulatory Visit: Payer: Medicare HMO | Attending: Cardiology

## 2022-09-01 DIAGNOSIS — Z95810 Presence of automatic (implantable) cardiac defibrillator: Secondary | ICD-10-CM

## 2022-09-01 DIAGNOSIS — I5032 Chronic diastolic (congestive) heart failure: Secondary | ICD-10-CM

## 2022-09-02 ENCOUNTER — Ambulatory Visit: Payer: Medicare HMO | Admitting: Physician Assistant

## 2022-09-02 NOTE — Progress Notes (Signed)
EPIC Encounter for ICM Monitoring  Patient Name: Marie Ramos is a 67 y.o. female Date: 09/02/2022 Primary Care Physican: Lawerance Cruel, MD Primary Cardiologist: Ali Lowe Electrophysiologist: Curt Bears 09/02/2022 Weight: 248 lbs    Time in AT/AF  0.0 hr/day (0.0%)          Spoke with patient and heart failure questions reviewed.  Transmission results reviewed.  Pt asymptomatic for fluid accumulation.  Reports feeling well at this time and voices no complaints.                  Diet:  She is better at limiting salt intake.         Optivol thoracic impedance suggesting possible fluid accumulation continues (since 1/28) even after taking Furosemide 40 mg 1 tablet bid x 4 days, 2/21-2/25.  Fluid index greater than normal threshold starting 08/09/22.   Prescribed:  Furosemide 40 mg Take 1 tablet (40 mg total) by mouth daily. Jardiance 10 mg take 1 tablet by mouth daily.   Labs: 11/27/2021 Creatinine 1.26, BUN 31, Potassium 5.0, Sodium 142, GFR 47 11/15/2021 Creatinine 1.05, BUN 41, Potassium 5.1, Sodium 139, GFR 59 10/10/2021 Creatinine 1.21, BUN 25, Potassium 5.2, Sodium 142 A complete set of results can be found in Results Review.   Recommendations:  Advised to limit salt intake and call if she experiences any fluid symptoms.    Follow-up plan: ICM clinic phone appointment on 09/08/2022 to recheck fluid levels.  91 day device clinic remote transmission 11/13/2022.      EP/Cardiology Office Visits:   Recall 08/02/2023 with Tommye Standard, PA.  11/24/2022 with Dr Ali Lowe.   Copy of ICM check sent to Dr. Curt Bears and Nicholes Rough PA for review and recommendations if needed.     3 month ICM trend: 09/01/2022.    12-14 Month ICM trend:     [name], RN 09/02/2022 3:21 PM

## 2022-09-03 NOTE — Progress Notes (Signed)
Remote ICD transmission.   

## 2022-09-04 ENCOUNTER — Ambulatory Visit (HOSPITAL_COMMUNITY): Payer: Medicare HMO | Attending: Physician Assistant

## 2022-09-04 DIAGNOSIS — R0609 Other forms of dyspnea: Secondary | ICD-10-CM | POA: Diagnosis not present

## 2022-09-04 LAB — ECHOCARDIOGRAM COMPLETE
Area-P 1/2: 2.72 cm2
Est EF: 55
S' Lateral: 2.25 cm

## 2022-09-04 MED ORDER — PERFLUTREN LIPID MICROSPHERE
1.0000 mL | INTRAVENOUS | Status: AC | PRN
  Administered 2022-09-04: 3 mL via INTRAVENOUS

## 2022-09-08 ENCOUNTER — Telehealth: Payer: Self-pay | Admitting: *Deleted

## 2022-09-08 ENCOUNTER — Ambulatory Visit: Payer: Medicare HMO | Attending: Cardiology

## 2022-09-08 DIAGNOSIS — Z79899 Other long term (current) drug therapy: Secondary | ICD-10-CM

## 2022-09-08 DIAGNOSIS — I5032 Chronic diastolic (congestive) heart failure: Secondary | ICD-10-CM

## 2022-09-08 DIAGNOSIS — Z95810 Presence of automatic (implantable) cardiac defibrillator: Secondary | ICD-10-CM

## 2022-09-08 NOTE — Telephone Encounter (Signed)
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 Please advise.  Thanks, Brenee Gajda

## 2022-09-08 NOTE — Progress Notes (Unsigned)
EPIC Encounter for ICM Monitoring  Patient Name: Marie Ramos is a 67 y.o. female Date: 09/08/2022 Primary Care Physican: Lawerance Cruel, MD Primary Cardiologist: Ali Lowe Electrophysiologist: Curt Bears 09/02/2022 Weight: 248 lbs  09/08/2022 Weight: 248 lbs   Time in AT/AF  0.0 hr/day (0.0%)          Spoke with patient and heart failure questions reviewed.  Transmission results reviewed.  Pt asymptomatic for fluid accumulation.  Reports feeling well at this time and voices no complaints.                  Diet:  She is better at limiting salt intake.         Optivol thoracic impedance suggesting possible fluid accumulation continues (since 1/28) even after taking Furosemide 40 mg 1 tablet bid x 4 days, 2/21-2/25.  Fluid index greater than normal threshold starting 08/09/22.   Prescribed:  Furosemide 40 mg Take 1 tablet (40 mg total) by mouth daily. Jardiance 10 mg take 1 tablet by mouth daily.   Labs: 11/27/2021 Creatinine 1.26, BUN 31, Potassium 5.0, Sodium 142, GFR 47 11/15/2021 Creatinine 1.05, BUN 41, Potassium 5.1, Sodium 139, GFR 59 10/10/2021 Creatinine 1.21, BUN 25, Potassium 5.2, Sodium 142 A complete set of results can be found in Results Review.   Recommendations:  Advised to limit salt intake and call if she experiences any fluid symptoms.  Confirmed she is taking Furosemide 40 mg daily.   Follow-up plan: ICM clinic phone appointment on 09/29/2022.  91 day device clinic remote transmission 11/13/2022.      EP/Cardiology Office Visits:   Recall 08/02/2023 with Tommye Standard, PA.  11/24/2022 with Dr Ali Lowe.   Copy of ICM check sent to Dr. Curt Bears and Dr Ali Lowe for review and recommendations if needed.     3 month ICM trend: 09/08/2022.    12-14 Month ICM trend:     [name], RN 09/08/2022 4:01 PM

## 2022-09-09 NOTE — Telephone Encounter (Signed)
Spoke with patient and informed her of response below per Tessa. She states her understanding and appreciation for the call.

## 2022-09-11 NOTE — Progress Notes (Signed)
No recommendations received.

## 2022-09-19 NOTE — Addendum Note (Signed)
Addended by: Shellia Cleverly on: 09/19/2022 09:23 AM   Modules accepted: Orders

## 2022-09-19 NOTE — Patient Instructions (Signed)
Pt aware to increase Lasix to 40 mg BID per Dr Darylene Price.  She reports she does not need a new RX at this time.  She will call when to does need a new RX.  BMP ordered.  Pt will come for blood work 09/26/22 for BMP.  Appt scheduled.

## 2022-09-23 ENCOUNTER — Encounter: Payer: Self-pay | Admitting: Physician Assistant

## 2022-09-23 ENCOUNTER — Ambulatory Visit (INDEPENDENT_AMBULATORY_CARE_PROVIDER_SITE_OTHER): Payer: Medicare HMO | Admitting: Physician Assistant

## 2022-09-23 DIAGNOSIS — R69 Illness, unspecified: Secondary | ICD-10-CM

## 2022-09-23 DIAGNOSIS — F319 Bipolar disorder, unspecified: Secondary | ICD-10-CM

## 2022-09-23 DIAGNOSIS — R9431 Abnormal electrocardiogram [ECG] [EKG]: Secondary | ICD-10-CM

## 2022-09-23 DIAGNOSIS — F411 Generalized anxiety disorder: Secondary | ICD-10-CM

## 2022-09-23 NOTE — Progress Notes (Signed)
[facility]  Patient ID: Marie Ramos,  MRN: [id number]  PCP: Lawerance Cruel, MD  Date of Evaluation: 08/21/2022 Time spent:20 minutes  Chief Complaint:  Chief Complaint   Follow-up    HISTORY/CURRENT STATUS: For routine med check. Marie Ramos, husband, is with her.   Trileptal was decreased last month b/c of emotional dulling.  The dose she is currently on is working well.  No manic sx now.  Patient denies increased energy with decreased need for sleep, increased talkativeness, racing thoughts, impulsivity or risky behaviors, increased spending, increased libido, grandiosity, increased irritability or anger, paranoia, or hallucinations.  Patient is able to enjoy things.  Energy and motivation are fair to good depending on the day.  Good.  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  Enjoying that.  No extreme sadness, tearfulness, or feelings of hopelessness.  ADLs and personal hygiene are normal.   Denies any changes in concentration, making decisions, or remembering things.  Appetite has not changed.  Weight is stable.  Anxiety is controlled.  She usually only takes the Xanax in the evening to help her relax to go to sleep.  Denies suicidal or homicidal thoughts.  Denies dizziness, syncope, seizures, numbness, tingling, tremor, tics, unsteady gait, slurred speech, confusion. Denies muscle or joint pain, stiffness, or dystonia.  Individual Medical History/ Review of Systems: Changes? :Yes   Lasix was increased by cardiology to twice daily.      Past medications for mental health diagnoses include: Lithium caused diarrhea and increased thirst, Trileptal, Effexor XR, Seroquel worked well, Abilify unknown what happened, Adderall, Risperdal helped mania but caused bilateral lower extremity edema and dry mouth.  Allergies: Sulfa antibiotics, Albuterol, Other, Sulfamethoxazole-trimethoprim, Vicodin hp [hydrocodone-acetaminophen], Naproxen, and Vicodin  [hydrocodone-acetaminophen]  Current Medications:  Current Outpatient Medications:    Acetylcysteine (NAC) 600 MG CAPS, Take 1 capsule (600 mg total) by mouth daily., Disp: 30 capsule, Rfl: 11   ALPRAZolam (XANAX) 0.25 MG tablet, Take 0.5-1 tablets (0.125-0.25 mg total) by mouth 3 (three) times daily as needed for anxiety., Disp: 90 tablet, Rfl: 1   amLODipine (NORVASC) 5 MG tablet, Take 1 tablet (5 mg total) by mouth daily with breakfast., Disp: 30 tablet, Rfl: 0   Ascorbic Acid (VITAMIN C PO), Take 1 capsule by mouth daily., Disp: , Rfl:    aspirin EC 81 MG tablet, Take 1 tablet (81 mg total) by mouth daily. Swallow whole., Disp: 90 tablet, Rfl: 3   atorvastatin (LIPITOR) 20 MG tablet, Take 1 tablet (20 mg total) by mouth daily at 6 PM., Disp: 30 tablet, Rfl: 0   carvedilol (COREG) 12.5 MG tablet, TAKE ONE TABLET BY MOUTH TWICE DAILY WITH BREAKFAST AND DINNER, Disp: 180 tablet, Rfl: 2   cholecalciferol (VITAMIN D3) 25 MCG (1000 UNIT) tablet, Take 2,000 Units by mouth daily., Disp: , Rfl:    famotidine (PEPCID) 20 MG tablet, Take 20 mg by mouth at bedtime., Disp: , Rfl:    fexofenadine (ALLEGRA) 180 MG tablet, Take 180 mg by mouth daily., Disp: , Rfl:    fluticasone (FLONASE) 50 MCG/ACT nasal spray, Place 1 spray into both nostrils daily. Use as directed, Disp: , Rfl:    furosemide (LASIX) 40 MG tablet, Take 1 tablet (40 mg total) by mouth daily. (Patient taking differently: Take 40 mg by mouth 2 (two) times daily. Increased 09/19/22 - pt will call when she needs a new rx), Disp: 90 tablet, Rfl: 3   Ginkgo Biloba 40 MG TABS,  Take 1 tablet (40 mg total) by mouth 2 (two) times daily., Disp: 180 tablet, Rfl: 11   losartan (COZAAR) 100 MG tablet, Take 1 tablet (100 mg total) by mouth daily., Disp: 90 tablet, Rfl: 3   Multiple Vitamin (MULTIVITAMIN WITH MINERALS) TABS tablet, Take 1 tablet by mouth daily., Disp: , Rfl:    Multiple Vitamins-Minerals (OCUVITE PO), Take 1 capsule by mouth daily., Disp: ,  Rfl:    Oxcarbazepine (TRILEPTAL) 300 MG tablet, 2 po am, 1.5 pills in the evening, Disp: 105 tablet, Rfl: 1   triamcinolone ointment (KENALOG) 0.5 %, Apply 1 application topically as needed., Disp: , Rfl: 0   venlafaxine XR (EFFEXOR XR) 75 MG 24 hr capsule, Take 1 capsule (75 mg total) by mouth daily with breakfast. Take with Effexor XR 300 mg, Disp: 90 capsule, Rfl: 1   venlafaxine XR (EFFEXOR-XR) 150 MG 24 hr capsule, TAKE 2 CAPSULES AT BEDTIME, Disp: 180 capsule, Rfl: 1   Risankizumab-rzaa (SKYRIZI, 150 MG DOSE, [place]), Inject into the skin every 3 (three) months. (Patient not taking: Reported on 08/21/2022), Disp: , Rfl:  Medication Side Effects: none  Family Medical/ Social History: Changes?  no  MENTAL HEALTH EXAM:  There were no vitals taken for this visit.There is no height or weight on file to calculate BMI.  General Appearance: Casual, Well Groomed, and Obese  Eye Contact:  Good  Speech:  Clear and Coherent, Pressured, and Talkative  Volume:  Normal  Mood:  Euphoric  Affect:  Congruent       Thought Process:  Goal Directed and Descriptions of Associations: Circumstantial  Orientation:  Full (Time, Place, and Person)  Thought Content: Logical   Suicidal Thoughts:  No  Homicidal Thoughts:  No  Memory:  Immediate;   Good and Fair Recent;   Good Remote;   Good  Judgement:  Good  Insight:  Good  Psychomotor Activity:  Normal and uses a walking stick  Concentration:  Concentration: Good and Attention Span: Good  Recall:  Good  Fund of Knowledge: Good  Language: Good  Assets:  Desire for Improvement Financial Resources/Insurance Housing Social Support Transportation  ADL's:  Intact  Cognition: WNL  Prognosis:  Good   DIAGNOSES:    ICD-10-CM   1. Bipolar I disorder ([place])  F31.9     2. Generalized anxiety disorder  F41.1     3. Chronic illness  R69     4. QT prolongation  R94.31       Receiving Psychotherapy: No   RECOMMENDATIONS:  PDMP reviewed. Last Xanax  filled 09/15/2022. I provided 20 minutes of face to face time during this encounter, including time spent before and after the visit in records review, medical decision making, counseling pertinent to today's visit, and charting.   I am glad to see her doing better.  No change in mental health medications.  Continue Xanax 0.25 mg, 1 p.o. nightly as needed sleep or anxiety. Continue Trileptal 300 mg, 2 q am, and 1.5 qhs (1,050 mg daily) Continue Effexor XR 150 mg, 2 p.o. daily plus Effexor XR 75 mg, 1 p.o. daily. Continue NAC, multivitamin, B complex, fish oil, vitamin D 2000 IUs daily, ginkgo biloba. Return in 2 months.  Avoid drugs that are known to cause prolonged QT interval  Donnal Moat, PA-C

## 2022-09-26 ENCOUNTER — Ambulatory Visit: Payer: Medicare HMO | Attending: Internal Medicine

## 2022-09-26 DIAGNOSIS — Z79899 Other long term (current) drug therapy: Secondary | ICD-10-CM

## 2022-09-26 DIAGNOSIS — I5032 Chronic diastolic (congestive) heart failure: Secondary | ICD-10-CM

## 2022-09-27 LAB — BASIC METABOLIC PANEL
BUN/Creatinine Ratio: 33 — ABNORMAL HIGH (ref 12–28)
BUN: 50 mg/dL — ABNORMAL HIGH (ref 8–27)
CO2: 29 mmol/L (ref 20–29)
Calcium: 9.6 mg/dL (ref 8.7–10.3)
Chloride: 99 mmol/L (ref 96–106)
Creatinine, Ser: 1.52 mg/dL — ABNORMAL HIGH (ref 0.57–1.00)
Glucose: 73 mg/dL (ref 70–99)
Potassium: 4.9 mmol/L (ref 3.5–5.2)
Sodium: 144 mmol/L (ref 134–144)
eGFR: 37 mL/min/{1.73_m2} — ABNORMAL LOW (ref >59)

## 2022-09-29 ENCOUNTER — Ambulatory Visit: Payer: Medicare HMO | Attending: Cardiology

## 2022-09-29 DIAGNOSIS — Z95810 Presence of automatic (implantable) cardiac defibrillator: Secondary | ICD-10-CM

## 2022-09-29 DIAGNOSIS — I5032 Chronic diastolic (congestive) heart failure: Secondary | ICD-10-CM | POA: Diagnosis not present

## 2022-09-29 NOTE — Progress Notes (Signed)
EPIC Encounter for ICM Monitoring  Patient Name: Marie Ramos is a 67 y.o. female Date: 09/29/2022 Primary Care Physican: Lawerance Cruel, MD Primary Cardiologist: Ali Lowe Electrophysiologist: Curt Bears 09/02/2022 Weight: 248 lbs  09/08/2022 Weight: 248 lbs 09/29/2022 Weight: 248 lbs   Time in AT/AF  0.0 hr/day (0.0%)          Spoke with patient and heart failure questions reviewed.  Transmission results reviewed.  Pt asymptomatic for fluid accumulation.  Reports feeling well at this time and voices no complaints.                  Diet:  She is better at limiting salt intake.         Optivol thoracic impedance suggesting possible fluid accumulation continues since 1/28 even after Furosemide dosage increased to 40 mg twice a day per 3/4 ICM note updated by Dr Dara Hoyer nurse Josiah Lobo.  Fluid index greater than normal threshold starting 08/09/22.   Prescribed:  Furosemide 40 mg Take 1 tablet (40 mg total) by mouth daily.  Per 3/4 ICM note updated by Dr Dara Hoyer nurse Josiah Lobo, Furosemide increased to 40 mg twice a day but not updated in Epic.    Jardiance 10 mg take 1 tablet by mouth daily.   Labs: 09/26/2022 Creatinine 1.52, BUN 50, Potassium 4.9, Sodium 144. GFR 37 07/30/2022 Creatinine 1.49, BUN 34, Potassium 4.5, Sodium 141, GFR 39 A complete set of results can be found in Results Review.   Recommendations:  Advised to limit salt intake and call if she experiences any fluid symptoms.   Confirmed she is taking Furosemide 40 mg twice a day.   Follow-up plan: ICM clinic phone appointment on 10/20/2022 to recheck fluid levels.  91 day device clinic remote transmission 11/13/2022.      EP/Cardiology Office Visits:   Recall 08/02/2023 with Tommye Standard, PA.  11/24/2022 with Dr Ali Lowe.   Copy of ICM check sent to Dr. Curt Bears.   Dr Ali Lowe recommended, on 3/4, to increase Furosemide dosage to twice a day.    3 month ICM trend: 09/22/2022.    12-14 Month ICM trend:      [name], RN 09/29/2022 12:44 PM

## 2022-10-20 ENCOUNTER — Ambulatory Visit: Payer: Medicare HMO | Attending: Cardiology

## 2022-10-20 DIAGNOSIS — Z95810 Presence of automatic (implantable) cardiac defibrillator: Secondary | ICD-10-CM

## 2022-10-20 DIAGNOSIS — I5032 Chronic diastolic (congestive) heart failure: Secondary | ICD-10-CM

## 2022-10-21 NOTE — Progress Notes (Signed)
EPIC Encounter for ICM Monitoring  Patient Name: Marie Ramos is a 67 y.o. female Date: 10/21/2022 Primary Care Physican: Daisy Floro, MD Primary Cardiologist: Lynnette Caffey Electrophysiologist: Elberta Fortis 09/02/2022 Weight: 248 lbs  09/08/2022 Weight: 248 lbs 09/29/2022 Weight: 248 lbs 10/21/2022 Weight: 248 lbs   Time in AT/AF  0.0 hr/day (0.0%)          Spoke with patient and heart failure questions reviewed.  Transmission results reviewed.  Pt asymptomatic for fluid accumulation.  Reports feeling well at this time and voices no complaints.                  Diet:  She is better at limiting salt intake.         Optivol thoracic impedance suggesting possible fluid accumulation starting 2/3 and returned to normal on 4/7.  Fluid index greater than threshold from 2/3-4/10.   Prescribed:  Furosemide 40 mg Take 1 tablet (40 mg total) by mouth daily.  Per 3/4 ICM note updated by Dr Trula Ore nurse Gildardo Pounds, Furosemide increased to 40 mg twice a day but not updated in Epic.    Jardiance 10 mg take 1 tablet by mouth daily.   Labs: 09/26/2022 Creatinine 1.52, BUN 50, Potassium 4.9, Sodium 144. GFR 37 07/30/2022 Creatinine 1.49, BUN 34, Potassium 4.5, Sodium 141, GFR 39 A complete set of results can be found in Results Review.   Recommendations:  ANo changes and encouraged to call if experiencing any fluid symptoms.   Follow-up plan: ICM clinic phone appointment on 11/10/2022.  91 day device clinic remote transmission 11/13/2022.      EP/Cardiology Office Visits:   Recall 08/02/2023 with Francis Dowse, PA.  11/24/2022 with Dr Lynnette Caffey.   Copy of ICM check sent to Dr. Elberta Fortis.     3 month ICM trend: 10/20/2022.    12-14 Month ICM trend:     [name], RN 10/21/2022 1:19 PM

## 2022-10-23 ENCOUNTER — Other Ambulatory Visit: Payer: Self-pay | Admitting: Physician Assistant

## 2022-10-23 ENCOUNTER — Telehealth: Payer: Self-pay | Admitting: Physician Assistant

## 2022-10-23 NOTE — Telephone Encounter (Signed)
Patient needs RF on alprazolam. Received RF request from pharmacy today and it has been pended to provider.

## 2022-10-23 NOTE — Telephone Encounter (Signed)
Pt LVM @ 10:02a. She said she is having issues with her scripts.  Next appt 5/20

## 2022-11-05 DIAGNOSIS — S060XAA Concussion with loss of consciousness status unknown, initial encounter: Secondary | ICD-10-CM | POA: Diagnosis present

## 2022-11-05 HISTORY — DX: Concussion with loss of consciousness status unknown, initial encounter: S06.0XAA

## 2022-11-10 ENCOUNTER — Ambulatory Visit: Payer: Medicare HMO | Attending: Cardiology

## 2022-11-10 DIAGNOSIS — Z95810 Presence of automatic (implantable) cardiac defibrillator: Secondary | ICD-10-CM | POA: Diagnosis not present

## 2022-11-10 DIAGNOSIS — I5032 Chronic diastolic (congestive) heart failure: Secondary | ICD-10-CM | POA: Diagnosis not present

## 2022-11-10 NOTE — Progress Notes (Unsigned)
EPIC Encounter for ICM Monitoring  Patient Name: Marie Ramos is a 67 y.o. female Date: 11/10/2022 Primary Care Physican: Daisy Floro, MD Primary Cardiologist: Lynnette Caffey Electrophysiologist: Elberta Fortis 09/02/2022 Weight: 248 lbs  09/08/2022 Weight: 248 lbs 09/29/2022 Weight: 248 lbs 10/21/2022 Weight: 248 lbs   Time in AT/AF  0.0 hr/day (0.0%)          Spoke with patient and heart failure questions reviewed.  Transmission results reviewed.  Pt asymptomatic for fluid accumulation.  Reports feeling well at this time and voices no complaints.                  Diet:  She is better at limiting salt intake.         Optivol thoracic impedance suggesting intermittent days with possible fluid accumulation since 4/7.     Prescribed:  Furosemide 40 mg Take 1 tablet (40 mg total) by mouth daily.  Per 3/4 ICM note updated by Dr Trula Ore nurse Gildardo Pounds, Furosemide increased to 40 mg twice a day but not updated in Epic.    Jardiance 10 mg take 1 tablet by mouth daily.   Labs: 09/26/2022 Creatinine 1.52, BUN 50, Potassium 4.9, Sodium 144. GFR 37 07/30/2022 Creatinine 1.49, BUN 34, Potassium 4.5, Sodium 141, GFR 39 A complete set of results can be found in Results Review.   Recommendations:  No changes and encouraged to call if experiencing any fluid symptoms.   Follow-up plan: ICM clinic phone appointment on 12/15/2022.  91 day device clinic remote transmission 11/13/2022.      EP/Cardiology Office Visits:   Recall 08/02/2023 with Francis Dowse, PA.  11/24/2022 with Dr Lynnette Caffey.   Copy of ICM check sent to Dr. Elberta Fortis.    3 month ICM trend: 11/10/2022.    12-14 Month ICM trend:     [name], RN 11/10/2022 10:36 AM

## 2022-11-11 DIAGNOSIS — Z9989 Dependence on other enabling machines and devices: Secondary | ICD-10-CM | POA: Diagnosis not present

## 2022-11-11 DIAGNOSIS — Z6841 Body Mass Index (BMI) 40.0 and over, adult: Secondary | ICD-10-CM | POA: Diagnosis not present

## 2022-11-11 DIAGNOSIS — R2689 Other abnormalities of gait and mobility: Secondary | ICD-10-CM | POA: Diagnosis not present

## 2022-11-13 ENCOUNTER — Ambulatory Visit: Payer: Medicare HMO

## 2022-11-13 ENCOUNTER — Other Ambulatory Visit: Payer: Self-pay | Admitting: Physician Assistant

## 2022-11-13 LAB — CUP PACEART REMOTE DEVICE CHECK
Battery Remaining Longevity: 32 mo
Battery Voltage: 2.94 V
Brady Statistic AP VP Percent: 0 %
Brady Statistic AP VS Percent: 0.02 %
Brady Statistic AS VP Percent: 0.04 %
Brady Statistic AS VS Percent: 99.93 %
Brady Statistic RA Percent Paced: 0.02 %
Brady Statistic RV Percent Paced: 0.04 %
Date Time Interrogation Session: 20240509012205
HighPow Impedance: 85 Ohm
Implantable Lead Connection Status: 753985
Implantable Lead Connection Status: 753985
Implantable Lead Implant Date: 20170227
Implantable Lead Implant Date: 20170227
Implantable Lead Location: 753859
Implantable Lead Location: 753860
Implantable Lead Model: 5076
Implantable Pulse Generator Implant Date: 20170227
Lead Channel Impedance Value: 361 Ohm
Lead Channel Impedance Value: 399 Ohm
Lead Channel Impedance Value: 418 Ohm
Lead Channel Pacing Threshold Amplitude: 0.625 V
Lead Channel Pacing Threshold Amplitude: 0.625 V
Lead Channel Pacing Threshold Pulse Width: 0.4 ms
Lead Channel Pacing Threshold Pulse Width: 0.4 ms
Lead Channel Sensing Intrinsic Amplitude: 20.625 mV
Lead Channel Sensing Intrinsic Amplitude: 20.625 mV
Lead Channel Sensing Intrinsic Amplitude: 3.125 mV
Lead Channel Sensing Intrinsic Amplitude: 3.125 mV
Lead Channel Setting Pacing Amplitude: 2 V
Lead Channel Setting Pacing Amplitude: 2.5 V
Lead Channel Setting Pacing Pulse Width: 0.4 ms
Lead Channel Setting Sensing Sensitivity: 0.3 mV
Zone Setting Status: 755011
Zone Setting Status: 755011

## 2022-11-19 NOTE — Progress Notes (Signed)
Cardiology Office Note:    Date:  11/24/2022   ID:  Marie Ramos, Marie Ramos 09-27-1955, MRN [id number]  PCP:  Daisy Floro, MD   [facility] Providers Cardiologist:  Alverda Skeans, MD Referring MD: Daisy Floro, MD   Chief Complaint/Reason for Referral: Dyspnea  PATIENT DID NOT APPEAR FOR APPOINTMENT   ASSESSMENT:    1. Chronic diastolic congestive heart failure (HCC)   2. Stage 3a chronic kidney disease (HCC)   3. Cardiac arrest (HCC)   4. Essential hypertension   5. Cerebrovascular accident (CVA), unspecified mechanism (HCC)   6. BMI 45.0-49.9, adult (HCC)   7. Hyperlipidemia LDL goal <70      PLAN:    In order of problems listed above: 1.  Chronic diastolic heart failure: Continue losartan 100 mg, Coreg, and consider restarting Jardiance.  Continue Lasix 40 mg daily.  Start spironolactone 25mg  qday and check BMP in one week. 2.  Stage III chronic kidney disease: Continue Jardiance and losartan for renal protection. 3.  Cardiac arrest: Status post ICD and followed by the EP division; recent interrogation demonstrated no atrial fibrillation. 4.  Hypertension:  5.  History of stroke: Continue aspirin 81 mg. 6.  Elevated BMI: Continue diet and exercise modification. 7.  Hyperlipidemia: Continue atorvastatin 20 mg; will check lipid panel, LFTs, LP(a) today.  Given her history of stroke her LDL goal is less than 55.         History of Present Illness:    FOCUSED PROBLEM LIST:   1.  VF arrest status post ICD with noted long QT; cardiac catheterization 2017 with no obstructive coronary artery disease 2.  Chronic kidney disease with GFR of 49 and a serum creatinine of 1.2 3.  Prior stroke and per EP note subclinical atrial fibrillation so remains on Eliquis; review of monitoring with EP division May 2023 showed very low atrial fibrillation burden so Eliquis discontinued. 4.  Hypertension 5.  BMI 49 6.  Status post OptiVol monitoring device 7.   Hyperlipidemia  May 2023: The patient is a 67 y.o. female with the indicated medical history here to establish general cardiology care.  The patient is followed by the EP division given her subclinical atrial fibrillation and ICD placement for ventricular fibrillatory arrest thought due to long QT syndrome.  The patient contacted the office recently due to shortness of breath.  The patient has been short of breath for many years.  I reviewed her weights over the last several years and they have been stable.  Shortness of breath is not a new issue.  She noticed a little bit more peripheral edema particularly in the evenings.  This is sometimes associated with difficulty putting her shoes on.  She tells me she is short of breath with walking.  She is not short of breath at rest.  She is unsure if she is short of breath when she lies down but she only uses 1 pillow to sleep.  She denies any exertional angina, presyncope, syncope, palpitations, signs or symptoms of stroke, or severe bleeding while on Eliquis.  She has required no emergency room visits or hospitalizations.  Plan: Start Jardiance 10 mg daily and Lasix 20 mg twice daily; stop Eliquis due to very low atrial fibrillation burden and start aspirin 81 mg.  Refer to pharmacy given elevated BMI.  Today: In the interim the patient had her ICD interrogated which showed absolutely no atrial fibrillation.  Echocardiogram demonstrated preserved LV function with ejection fraction 55% with  no significant valvular abnormalities.  She was seen in this division in February and was doing fairly well.        Current Medications: No outpatient medications have been marked as taking for the 11/24/22 encounter (Office Visit) with Orbie Pyo, MD.     Allergies:    Sulfa antibiotics, Albuterol, Other, Sulfamethoxazole-trimethoprim, Vicodin hp [hydrocodone-acetaminophen], Naproxen, and Vicodin [hydrocodone-acetaminophen]   Social History:   Social History    Tobacco Use   Smoking status: Never   Smokeless tobacco: Never  Vaping Use   Vaping Use: Never used  Substance Use Topics   Alcohol use: No   Drug use: No     Family Hx: Family History  Problem Relation Age of Onset   Cancer Mother    Depression Mother    CAD Mother    COPD Father    Depression Father    Diabetes Maternal Grandfather    Depression Maternal Grandfather    Arthritis/Rheumatoid Paternal Grandmother      Review of Systems:   Please see the history of present illness.    All other systems reviewed and are negative.     EKGs/Labs/Other Test Reviewed:    EKG:  EKG performed April 2023 that I personally reviewed demonstrates sinus rhythm.  Prior CV studies:  2024 TTE  1. Left ventricular ejection fraction, by estimation, is 55%. The left  ventricle has normal function. Left ventricular endocardial border not  optimally defined to evaluate regional wall motion despite use of Definity  contrast. There is severe left  ventricular hypertrophy of the septal segment (15 mm), and otherwise wall  thickness is not well visualized. No left ventricular outflow tract  obstruction demonstrated. Left ventricular diastolic parameters are  consistent with Grade I diastolic  dysfunction (impaired relaxation).   2. Right ventricular systolic function is normal. The right ventricular  size is normal. Tricuspid regurgitation signal is inadequate for assessing  PA pressure.   3. The mitral valve is degenerative. Trivial mitral valve regurgitation.  No evidence of mitral stenosis.   4. The aortic valve was not well visualized. There is mild calcification  of the aortic valve. Aortic valve regurgitation is trivial. Aortic valve  sclerosis/calcification is present, without any evidence of aortic  stenosis.   5. The inferior vena cava is normal in size with greater than 50%  respiratory variability, suggesting right atrial pressure of 3 mmHg.   2022 carotid ultrasound  with mild disease bilaterally  2022 TTE ejection fraction of 65 to 70% with left ventricular hypertrophy of basal septum and no significant valvular abnormalities  2017 coronary angiography with no obstructive disease  2017 cardiac MRI with no late gadolinium enhancement with ejection fraction of 45% and moderate concentric hypertrophy  Other studies Reviewed: Review of the additional studies/records demonstrates: None relevant  Recent Labs: 07/30/2022: TSH 2.420 11/24/2022: ALT 24; BUN 49; Creatinine, Ser 1.70; Hemoglobin 11.2; Hemoglobin 11.2; Magnesium 2.9; Platelets 451; Potassium 4.9; Potassium 4.9; Sodium 139; Sodium 138   Recent Lipid Panel Lab Results  Component Value Date/Time   CHOL 225 (H) 09/03/2015 02:48 AM   TRIG 128 08/16/2019 04:54 PM   HDL 43 09/03/2015 02:48 AM   LDLCALC 115 (H) 09/03/2015 02:48 AM    Risk Assessment/Calculations:           Physical Exam:      Note:  This document was prepared using Dragon voice recognition software and may include unintentional dictation errors.

## 2022-11-24 ENCOUNTER — Inpatient Hospital Stay (HOSPITAL_COMMUNITY)
Admission: EM | Admit: 2022-11-24 | Discharge: 2022-12-02 | DRG: 291 | Disposition: A | Discharge status: Skilled Nursing Facility | Payer: Medicare HMO | Attending: Internal Medicine | Admitting: Internal Medicine

## 2022-11-24 ENCOUNTER — Emergency Department (HOSPITAL_COMMUNITY)
Admit: 2022-11-24 | Discharge: 2022-11-24 | Disposition: A | Discharge status: Home / Self Care | Payer: Medicare HMO | Attending: Emergency Medicine | Admitting: Emergency Medicine

## 2022-11-24 ENCOUNTER — Ambulatory Visit (INDEPENDENT_AMBULATORY_CARE_PROVIDER_SITE_OTHER): Payer: Medicare HMO | Admitting: Physician Assistant

## 2022-11-24 ENCOUNTER — Encounter: Payer: Self-pay | Admitting: Physician Assistant

## 2022-11-24 ENCOUNTER — Encounter (HOSPITAL_COMMUNITY): Payer: Self-pay

## 2022-11-24 ENCOUNTER — Ambulatory Visit (INDEPENDENT_AMBULATORY_CARE_PROVIDER_SITE_OTHER): Payer: Medicare HMO | Admitting: Internal Medicine

## 2022-11-24 ENCOUNTER — Emergency Department (HOSPITAL_COMMUNITY): Payer: Medicare HMO

## 2022-11-24 ENCOUNTER — Other Ambulatory Visit: Payer: Self-pay

## 2022-11-24 DIAGNOSIS — F5105 Insomnia due to other mental disorder: Secondary | ICD-10-CM | POA: Diagnosis not present

## 2022-11-24 DIAGNOSIS — I5032 Chronic diastolic (congestive) heart failure: Secondary | ICD-10-CM

## 2022-11-24 DIAGNOSIS — G931 Anoxic brain damage, not elsewhere classified: Secondary | ICD-10-CM | POA: Diagnosis present

## 2022-11-24 DIAGNOSIS — Z9081 Acquired absence of spleen: Secondary | ICD-10-CM | POA: Diagnosis not present

## 2022-11-24 DIAGNOSIS — R0609 Other forms of dyspnea: Secondary | ICD-10-CM | POA: Diagnosis not present

## 2022-11-24 DIAGNOSIS — Z8616 Personal history of COVID-19: Secondary | ICD-10-CM

## 2022-11-24 DIAGNOSIS — R531 Weakness: Secondary | ICD-10-CM

## 2022-11-24 DIAGNOSIS — Z885 Allergy status to narcotic agent status: Secondary | ICD-10-CM

## 2022-11-24 DIAGNOSIS — Z818 Family history of other mental and behavioral disorders: Secondary | ICD-10-CM

## 2022-11-24 DIAGNOSIS — I110 Hypertensive heart disease with heart failure: Secondary | ICD-10-CM | POA: Diagnosis not present

## 2022-11-24 DIAGNOSIS — N1832 Chronic kidney disease, stage 3b: Secondary | ICD-10-CM | POA: Diagnosis not present

## 2022-11-24 DIAGNOSIS — G251 Drug-induced tremor: Secondary | ICD-10-CM

## 2022-11-24 DIAGNOSIS — I639 Cerebral infarction, unspecified: Secondary | ICD-10-CM

## 2022-11-24 DIAGNOSIS — Z8673 Personal history of transient ischemic attack (TIA), and cerebral infarction without residual deficits: Secondary | ICD-10-CM

## 2022-11-24 DIAGNOSIS — Z90710 Acquired absence of both cervix and uterus: Secondary | ICD-10-CM

## 2022-11-24 DIAGNOSIS — N1831 Chronic kidney disease, stage 3a: Secondary | ICD-10-CM

## 2022-11-24 DIAGNOSIS — F411 Generalized anxiety disorder: Secondary | ICD-10-CM | POA: Diagnosis not present

## 2022-11-24 DIAGNOSIS — J9601 Acute respiratory failure with hypoxia: Secondary | ICD-10-CM | POA: Diagnosis not present

## 2022-11-24 DIAGNOSIS — Z882 Allergy status to sulfonamides status: Secondary | ICD-10-CM

## 2022-11-24 DIAGNOSIS — I5033 Acute on chronic diastolic (congestive) heart failure: Secondary | ICD-10-CM

## 2022-11-24 DIAGNOSIS — M25461 Effusion, right knee: Secondary | ICD-10-CM | POA: Diagnosis not present

## 2022-11-24 DIAGNOSIS — E785 Hyperlipidemia, unspecified: Secondary | ICD-10-CM

## 2022-11-24 DIAGNOSIS — I10 Essential (primary) hypertension: Secondary | ICD-10-CM | POA: Diagnosis not present

## 2022-11-24 DIAGNOSIS — Z825 Family history of asthma and other chronic lower respiratory diseases: Secondary | ICD-10-CM

## 2022-11-24 DIAGNOSIS — R296 Repeated falls: Secondary | ICD-10-CM | POA: Diagnosis present

## 2022-11-24 DIAGNOSIS — R5381 Other malaise: Secondary | ICD-10-CM | POA: Diagnosis present

## 2022-11-24 DIAGNOSIS — I5043 Acute on chronic combined systolic (congestive) and diastolic (congestive) heart failure: Secondary | ICD-10-CM | POA: Diagnosis present

## 2022-11-24 DIAGNOSIS — D7589 Other specified diseases of blood and blood-forming organs: Secondary | ICD-10-CM | POA: Diagnosis present

## 2022-11-24 DIAGNOSIS — R32 Unspecified urinary incontinence: Secondary | ICD-10-CM | POA: Diagnosis present

## 2022-11-24 DIAGNOSIS — N179 Acute kidney failure, unspecified: Secondary | ICD-10-CM | POA: Diagnosis present

## 2022-11-24 DIAGNOSIS — I130 Hypertensive heart and chronic kidney disease with heart failure and stage 1 through stage 4 chronic kidney disease, or unspecified chronic kidney disease: Principal | ICD-10-CM | POA: Diagnosis present

## 2022-11-24 DIAGNOSIS — M1712 Unilateral primary osteoarthritis, left knee: Secondary | ICD-10-CM | POA: Diagnosis not present

## 2022-11-24 DIAGNOSIS — Z6841 Body Mass Index (BMI) 40.0 and over, adult: Secondary | ICD-10-CM | POA: Diagnosis not present

## 2022-11-24 DIAGNOSIS — N189 Chronic kidney disease, unspecified: Secondary | ICD-10-CM | POA: Diagnosis present

## 2022-11-24 DIAGNOSIS — Z886 Allergy status to analgesic agent status: Secondary | ICD-10-CM | POA: Diagnosis not present

## 2022-11-24 DIAGNOSIS — Z833 Family history of diabetes mellitus: Secondary | ICD-10-CM

## 2022-11-24 DIAGNOSIS — Z86718 Personal history of other venous thrombosis and embolism: Secondary | ICD-10-CM

## 2022-11-24 DIAGNOSIS — I469 Cardiac arrest, cause unspecified: Secondary | ICD-10-CM | POA: Diagnosis present

## 2022-11-24 DIAGNOSIS — Z8249 Family history of ischemic heart disease and other diseases of the circulatory system: Secondary | ICD-10-CM

## 2022-11-24 DIAGNOSIS — I509 Heart failure, unspecified: Secondary | ICD-10-CM | POA: Diagnosis not present

## 2022-11-24 DIAGNOSIS — I421 Obstructive hypertrophic cardiomyopathy: Secondary | ICD-10-CM | POA: Diagnosis present

## 2022-11-24 DIAGNOSIS — Z881 Allergy status to other antibiotic agents status: Secondary | ICD-10-CM | POA: Diagnosis not present

## 2022-11-24 DIAGNOSIS — F99 Mental disorder, not otherwise specified: Secondary | ICD-10-CM | POA: Diagnosis not present

## 2022-11-24 DIAGNOSIS — I5021 Acute systolic (congestive) heart failure: Secondary | ICD-10-CM | POA: Diagnosis present

## 2022-11-24 DIAGNOSIS — Z95810 Presence of automatic (implantable) cardiac defibrillator: Secondary | ICD-10-CM | POA: Diagnosis present

## 2022-11-24 DIAGNOSIS — R69 Illness, unspecified: Secondary | ICD-10-CM | POA: Diagnosis not present

## 2022-11-24 DIAGNOSIS — R9431 Abnormal electrocardiogram [ECG] [EKG]: Secondary | ICD-10-CM | POA: Diagnosis not present

## 2022-11-24 DIAGNOSIS — M1711 Unilateral primary osteoarthritis, right knee: Secondary | ICD-10-CM | POA: Diagnosis not present

## 2022-11-24 DIAGNOSIS — R55 Syncope and collapse: Secondary | ICD-10-CM | POA: Diagnosis not present

## 2022-11-24 DIAGNOSIS — I6389 Other cerebral infarction: Secondary | ICD-10-CM | POA: Diagnosis present

## 2022-11-24 DIAGNOSIS — M6281 Muscle weakness (generalized): Secondary | ICD-10-CM | POA: Diagnosis not present

## 2022-11-24 DIAGNOSIS — R0689 Other abnormalities of breathing: Secondary | ICD-10-CM | POA: Diagnosis not present

## 2022-11-24 DIAGNOSIS — F3170 Bipolar disorder, currently in remission, most recent episode unspecified: Secondary | ICD-10-CM | POA: Diagnosis not present

## 2022-11-24 DIAGNOSIS — Z7401 Bed confinement status: Secondary | ICD-10-CM | POA: Diagnosis not present

## 2022-11-24 DIAGNOSIS — R41841 Cognitive communication deficit: Secondary | ICD-10-CM | POA: Diagnosis not present

## 2022-11-24 DIAGNOSIS — I6340 Cerebral infarction due to embolism of unspecified cerebral artery: Secondary | ICD-10-CM | POA: Diagnosis present

## 2022-11-24 DIAGNOSIS — J9600 Acute respiratory failure, unspecified whether with hypoxia or hypercapnia: Secondary | ICD-10-CM | POA: Diagnosis not present

## 2022-11-24 DIAGNOSIS — R413 Other amnesia: Secondary | ICD-10-CM

## 2022-11-24 DIAGNOSIS — Z8674 Personal history of sudden cardiac arrest: Secondary | ICD-10-CM

## 2022-11-24 DIAGNOSIS — R269 Unspecified abnormalities of gait and mobility: Secondary | ICD-10-CM

## 2022-11-24 DIAGNOSIS — W19XXXD Unspecified fall, subsequent encounter: Secondary | ICD-10-CM | POA: Diagnosis not present

## 2022-11-24 DIAGNOSIS — R609 Edema, unspecified: Secondary | ICD-10-CM | POA: Diagnosis not present

## 2022-11-24 DIAGNOSIS — R0602 Shortness of breath: Secondary | ICD-10-CM | POA: Diagnosis not present

## 2022-11-24 DIAGNOSIS — W19XXXA Unspecified fall, initial encounter: Principal | ICD-10-CM

## 2022-11-24 DIAGNOSIS — R29898 Other symptoms and signs involving the musculoskeletal system: Secondary | ICD-10-CM | POA: Diagnosis not present

## 2022-11-24 DIAGNOSIS — Z7982 Long term (current) use of aspirin: Secondary | ICD-10-CM

## 2022-11-24 DIAGNOSIS — I69811 Memory deficit following other cerebrovascular disease: Secondary | ICD-10-CM | POA: Diagnosis not present

## 2022-11-24 DIAGNOSIS — F319 Bipolar disorder, unspecified: Secondary | ICD-10-CM

## 2022-11-24 DIAGNOSIS — Z79899 Other long term (current) drug therapy: Secondary | ICD-10-CM

## 2022-11-24 DIAGNOSIS — M25462 Effusion, left knee: Secondary | ICD-10-CM | POA: Diagnosis not present

## 2022-11-24 HISTORY — DX: Acute on chronic diastolic (congestive) heart failure: I50.33

## 2022-11-24 LAB — CBC WITH DIFFERENTIAL/PLATELET
Abs Immature Granulocytes: 0.04 10*3/uL (ref 0.00–0.07)
Basophils Absolute: 0 10*3/uL (ref 0.0–0.1)
Basophils Relative: 0 %
Eosinophils Absolute: 0.2 10*3/uL (ref 0.0–0.5)
Eosinophils Relative: 2 %
HCT: 35 % — ABNORMAL LOW (ref 36.0–46.0)
Hemoglobin: 10.3 g/dL — ABNORMAL LOW (ref 12.0–15.0)
Immature Granulocytes: 0 %
Lymphocytes Relative: 9 %
Lymphs Abs: 0.9 10*3/uL (ref 0.7–4.0)
MCH: 31.4 pg (ref 26.0–34.0)
MCHC: 29.4 g/dL — ABNORMAL LOW (ref 30.0–36.0)
MCV: 106.7 fL — ABNORMAL HIGH (ref 80.0–100.0)
Monocytes Absolute: 0.9 10*3/uL (ref 0.1–1.0)
Monocytes Relative: 9 %
Neutro Abs: 7.8 10*3/uL — ABNORMAL HIGH (ref 1.7–7.7)
Neutrophils Relative %: 80 %
Platelets: 451 10*3/uL — ABNORMAL HIGH (ref 150–400)
RBC: 3.28 MIL/uL — ABNORMAL LOW (ref 3.87–5.11)
RDW: 15.3 % (ref 11.5–15.5)
WBC: 9.9 10*3/uL (ref 4.0–10.5)
nRBC: 0.4 % — ABNORMAL HIGH (ref 0.0–0.2)

## 2022-11-24 LAB — URINALYSIS, ROUTINE W REFLEX MICROSCOPIC
Bilirubin Urine: NEGATIVE
Glucose, UA: NEGATIVE mg/dL
Hgb urine dipstick: NEGATIVE
Ketones, ur: NEGATIVE mg/dL
Leukocytes,Ua: NEGATIVE
Nitrite: NEGATIVE
Protein, ur: 100 mg/dL — AB
Specific Gravity, Urine: 1.014 (ref 1.005–1.030)
pH: 5 (ref 5.0–8.0)

## 2022-11-24 LAB — COMPREHENSIVE METABOLIC PANEL
ALT: 24 U/L (ref 0–44)
AST: 23 U/L (ref 15–41)
Albumin: 2.8 g/dL — ABNORMAL LOW (ref 3.5–5.0)
Alkaline Phosphatase: 95 U/L (ref 38–126)
Anion gap: 9 (ref 5–15)
BUN: 46 mg/dL — ABNORMAL HIGH (ref 8–23)
CO2: 28 mmol/L (ref 22–32)
Calcium: 8.9 mg/dL (ref 8.9–10.3)
Chloride: 101 mmol/L (ref 98–111)
Creatinine, Ser: 1.71 mg/dL — ABNORMAL HIGH (ref 0.44–1.00)
GFR, Estimated: 32 mL/min — ABNORMAL LOW (ref >60)
Glucose, Bld: 72 mg/dL (ref 70–99)
Potassium: 4.9 mmol/L (ref 3.5–5.1)
Sodium: 138 mmol/L (ref 135–145)
Total Bilirubin: 0.5 mg/dL (ref 0.3–1.2)
Total Protein: 6.4 g/dL — ABNORMAL LOW (ref 6.5–8.1)

## 2022-11-24 LAB — I-STAT CHEM 8, ED
BUN: 49 mg/dL — ABNORMAL HIGH (ref 8–23)
Calcium, Ion: 1.13 mmol/L — ABNORMAL LOW (ref 1.15–1.40)
Chloride: 104 mmol/L (ref 98–111)
Creatinine, Ser: 1.7 mg/dL — ABNORMAL HIGH (ref 0.44–1.00)
Glucose, Bld: 74 mg/dL (ref 70–99)
HCT: 33 % — ABNORMAL LOW (ref 36.0–46.0)
Hemoglobin: 11.2 g/dL — ABNORMAL LOW (ref 12.0–15.0)
Potassium: 4.9 mmol/L (ref 3.5–5.1)
Sodium: 139 mmol/L (ref 135–145)
TCO2: 34 mmol/L — ABNORMAL HIGH (ref 22–32)

## 2022-11-24 LAB — I-STAT VENOUS BLOOD GAS, ED
Acid-Base Excess: 7 mmol/L — ABNORMAL HIGH (ref 0.0–2.0)
Bicarbonate: 32.5 mmol/L — ABNORMAL HIGH (ref 20.0–28.0)
Calcium, Ion: 1.13 mmol/L — ABNORMAL LOW (ref 1.15–1.40)
HCT: 33 % — ABNORMAL LOW (ref 36.0–46.0)
Hemoglobin: 11.2 g/dL — ABNORMAL LOW (ref 12.0–15.0)
O2 Saturation: 100 %
Potassium: 4.9 mmol/L (ref 3.5–5.1)
Sodium: 138 mmol/L (ref 135–145)
TCO2: 34 mmol/L — ABNORMAL HIGH (ref 22–32)
pCO2, Ven: 50.9 mmHg (ref 44–60)
pH, Ven: 7.413 (ref 7.25–7.43)
pO2, Ven: 181 mmHg — ABNORMAL HIGH (ref 32–45)

## 2022-11-24 LAB — TROPONIN I (HIGH SENSITIVITY)
Troponin I (High Sensitivity): 16 ng/L (ref <18)
Troponin I (High Sensitivity): 47 ng/L — ABNORMAL HIGH (ref <18)
Troponin I (High Sensitivity): 13 ng/L (ref <18)
Troponin I (High Sensitivity): 16 ng/L (ref <18)

## 2022-11-24 LAB — D-DIMER, QUANTITATIVE: D-Dimer, Quant: 1.99 ug/mL-FEU — ABNORMAL HIGH (ref 0.00–0.50)

## 2022-11-24 LAB — BRAIN NATRIURETIC PEPTIDE: B Natriuretic Peptide: 120.2 pg/mL — ABNORMAL HIGH (ref 0.0–100.0)

## 2022-11-24 LAB — MAGNESIUM: Magnesium: 2.9 mg/dL — ABNORMAL HIGH (ref 1.7–2.4)

## 2022-11-24 MED ORDER — CARVEDILOL 12.5 MG PO TABS
12.5000 mg | ORAL_TABLET | Freq: Two times a day (BID) | ORAL | Status: DC
  Administered 2022-11-25 – 2022-12-02 (×15): 12.5 mg via ORAL
  Filled 2022-11-24 (×15): qty 1

## 2022-11-24 MED ORDER — LOSARTAN POTASSIUM 50 MG PO TABS
100.0000 mg | ORAL_TABLET | Freq: Every day | ORAL | Status: DC
  Administered 2022-11-25: 100 mg via ORAL
  Filled 2022-11-24: qty 2

## 2022-11-24 MED ORDER — ACETAMINOPHEN 500 MG PO TABS
500.0000 mg | ORAL_TABLET | Freq: Four times a day (QID) | ORAL | Status: DC | PRN
  Administered 2022-11-26 – 2022-12-01 (×3): 500 mg via ORAL
  Filled 2022-11-24 (×4): qty 1

## 2022-11-24 MED ORDER — VENLAFAXINE HCL ER 75 MG PO CP24
300.0000 mg | ORAL_CAPSULE | Freq: Every day | ORAL | Status: DC
  Administered 2022-11-24 – 2022-11-25 (×2): 300 mg via ORAL
  Filled 2022-11-24 (×2): qty 4

## 2022-11-24 MED ORDER — ASPIRIN 81 MG PO TBEC
81.0000 mg | DELAYED_RELEASE_TABLET | Freq: Every day | ORAL | Status: DC
  Administered 2022-11-25 – 2022-12-02 (×8): 81 mg via ORAL
  Filled 2022-11-24 (×8): qty 1

## 2022-11-24 MED ORDER — ATORVASTATIN CALCIUM 10 MG PO TABS
20.0000 mg | ORAL_TABLET | Freq: Every day | ORAL | Status: DC
  Administered 2022-11-25 – 2022-12-01 (×7): 20 mg via ORAL
  Filled 2022-11-24 (×7): qty 2

## 2022-11-24 MED ORDER — FUROSEMIDE 10 MG/ML IJ SOLN
20.0000 mg | Freq: Two times a day (BID) | INTRAMUSCULAR | Status: DC
  Administered 2022-11-25: 20 mg via INTRAVENOUS
  Filled 2022-11-24: qty 2

## 2022-11-24 MED ORDER — VENLAFAXINE HCL ER 150 MG PO CP24: 300.0000 mg | ORAL_CAPSULE | Freq: Every day | ORAL | 1 refills | Status: DC

## 2022-11-24 MED ORDER — VENLAFAXINE HCL ER 75 MG PO CP24: 75.0000 mg | ORAL_CAPSULE | Freq: Every day | ORAL | 1 refills | Status: DC

## 2022-11-24 MED ORDER — NYSTATIN 100000 UNIT/GM EX CREA
1.0000 | TOPICAL_CREAM | Freq: Two times a day (BID) | CUTANEOUS | Status: DC
  Administered 2022-11-25 – 2022-12-02 (×16): 1 via TOPICAL
  Filled 2022-11-24: qty 30

## 2022-11-24 MED ORDER — FUROSEMIDE 10 MG/ML IJ SOLN
40.0000 mg | Freq: Once | INTRAMUSCULAR | Status: AC
  Administered 2022-11-24: 40 mg via INTRAVENOUS
  Filled 2022-11-24: qty 4

## 2022-11-24 MED ORDER — AMLODIPINE BESYLATE 5 MG PO TABS
5.0000 mg | ORAL_TABLET | Freq: Every day | ORAL | Status: DC
  Administered 2022-11-25: 5 mg via ORAL
  Filled 2022-11-24 (×2): qty 1

## 2022-11-24 MED ORDER — ALPRAZOLAM 0.5 MG PO TABS
0.5000 mg | ORAL_TABLET | Freq: Every evening | ORAL | Status: DC | PRN
  Administered 2022-11-25 – 2022-12-01 (×8): 0.5 mg via ORAL
  Filled 2022-11-24: qty 1
  Filled 2022-11-24: qty 2
  Filled 2022-11-24 (×6): qty 1

## 2022-11-24 MED ORDER — BISACODYL 5 MG PO TBEC: 5.0000 mg | DELAYED_RELEASE_TABLET | Freq: Every day | ORAL | Status: DC | PRN

## 2022-11-24 MED ORDER — VENLAFAXINE HCL ER 75 MG PO CP24
75.0000 mg | ORAL_CAPSULE | Freq: Every day | ORAL | Status: DC
  Administered 2022-11-25 – 2022-12-02 (×8): 75 mg via ORAL
  Filled 2022-11-24 (×8): qty 1

## 2022-11-24 MED ORDER — FAMOTIDINE 20 MG PO TABS
20.0000 mg | ORAL_TABLET | Freq: Every day | ORAL | Status: DC
  Administered 2022-11-25 – 2022-12-01 (×7): 20 mg via ORAL
  Filled 2022-11-24 (×7): qty 1

## 2022-11-24 MED ORDER — ACETAMINOPHEN 650 MG RE SUPP: 650.0000 mg | Freq: Four times a day (QID) | RECTAL | Status: DC | PRN

## 2022-11-24 MED ORDER — OXCARBAZEPINE 300 MG PO TABS: ORAL_TABLET | ORAL | 0 refills | Status: DC

## 2022-11-24 MED ORDER — OXCARBAZEPINE 300 MG PO TABS
600.0000 mg | ORAL_TABLET | Freq: Every day | ORAL | Status: DC
  Administered 2022-11-25 – 2022-11-26 (×2): 600 mg via ORAL
  Filled 2022-11-24 (×2): qty 2

## 2022-11-24 NOTE — Progress Notes (Signed)
Bilateral lower extremity venous duplex has been completed. Preliminary results can be found in CV Proc through chart review.  Results were given to Dr. Renaye Rakers.  11/24/22 6:56 PM Olen Cordial RVT

## 2022-11-24 NOTE — ED Notes (Signed)
Patient's O2 noted to drop while sleeping, increased to 4 lpm. Patient denies normally wearing a CPAP at home while sleeping.

## 2022-11-24 NOTE — ED Notes (Signed)
Spoke with dr. Renaye Rakers for Marie Ramos  inform him about the nm pulmonary is not available at this time . Pt will be admitted and will be done sometime tomorrow

## 2022-11-24 NOTE — ED Provider Notes (Signed)
[place] EMERGENCY DEPARTMENT AT [facility] Provider Note   CSN: [id number] Arrival date & time: 11/24/22  1055     History  Chief Complaint  Patient presents with   Marie Ramos is a 67 y.o. female.  HPI Patient presents for falls.  Medical history includes HTN, CHF, CVA, TBI, anoxic brain injury, HLD, DVT.  She reports 6-10 falls over the past 6 weeks.  This did begin shortly after she went up on her Trileptal.  Most recent fall was 3 days ago.  During that fall, she did land on both of her knees.  Since then, she has had bilateral knee pain as well as worsened mobility.  She had a psychiatry telehealth visit today.  She was instructed to go down on her Trileptal.  Told cardiology visit for later today but came to the ED due to her concern of decreased mobility.  EMS was called to her home.  When EMS arrived on scene, patient had SpO2 of 70% on room air.  She does not wear oxygen at baseline.  EMS placed her on supplemental oxygen.  She was able to improved to normal SpO2 on 2 L.  Besides her knee pain, patient endorses some mild right posterior shoulder pain.  She denies any other current areas of discomfort.  Patient attributes her falls to episodes of dizziness and lightheadedness.  When EMS stood her up today she had an episode of urine incontinence.  EMS noted heart rate in the range of 60s.  She has a defibrillator that is not a pacemaker.  She is on AV nodal blocker medication.    Home Medications Prior to Admission medications   Medication Sig Start Date End Date Taking? Authorizing Provider  Acetylcysteine (NAC) 600 MG CAPS Take 1 capsule (600 mg total) by mouth daily. 06/03/21   Cherie Ouch, PA-C  ALPRAZolam Prudy Feeler) 0.25 MG tablet Take one-half to one tablet by  mouth 3 times daily as needed for anxiety Patient taking differently: Take 0.5 mg by mouth at bedtime. 10/24/22   Melony Overly T, PA-C  amLODipine (NORVASC) 5 MG tablet Take 1 tablet (5 mg  total) by mouth daily with breakfast. 10/11/15   Marcello Fennel, MD  Ascorbic Acid (VITAMIN C PO) Take 1 capsule by mouth daily.    [provider]  aspirin EC 81 MG tablet Take 1 tablet (81 mg total) by mouth daily. Swallow whole. 11/08/21   Orbie Pyo, MD  atorvastatin (LIPITOR) 20 MG tablet Take 1 tablet (20 mg total) by mouth daily at 6 PM. 10/11/15   Marcello Fennel, MD  carvedilol (COREG) 12.5 MG tablet TAKE ONE TABLET BY MOUTH TWICE DAILY WITH BREAKFAST AND DINNER 05/09/19   Sheilah Pigeon, PA-C  cholecalciferol (VITAMIN D3) 25 MCG (1000 UNIT) tablet Take 2,000 Units by mouth daily.    [provider]  famotidine (PEPCID) 20 MG tablet Take 20 mg by mouth at bedtime.    [provider]  fexofenadine (ALLEGRA) 180 MG tablet Take 180 mg by mouth daily.    [provider]  fluticasone (FLONASE) 50 MCG/ACT nasal spray Place 1 spray into both nostrils daily. Use as directed    [provider]  furosemide (LASIX) 40 MG tablet Take 1 tablet (40 mg total) by mouth daily. Patient taking differently: Take 40 mg by mouth 2 (two) times daily. Increased 09/19/22 - pt will call when she needs a new rx 12/30/21  Orbie Pyo, MD  Ginkgo Biloba 40 MG TABS Take 1 tablet (40 mg total) by mouth 2 (two) times daily. 06/03/21   Melony Overly T, PA-C  losartan (COZAAR) 100 MG tablet Take 1 tablet (100 mg total) by mouth daily. 06/23/16   Allred, Fayrene Fearing, MD  Multiple Vitamin (MULTIVITAMIN WITH MINERALS) TABS tablet Take 1 tablet by mouth daily.    [provider]  Multiple Vitamins-Minerals (OCUVITE PO) Take 1 capsule by mouth daily.    [provider]  Oxcarbazepine (TRILEPTAL) 300 MG tablet Take two tablets by mouth every morning, and take one tablet in the evening 11/24/22   Hurst, Teresa T, PA-C  Risankizumab-rzaa (SKYRIZI, 150 MG DOSE, [place]) Inject into the skin every 3 (three) months. Patient not taking: Reported on 08/21/2022    [provider]  triamcinolone ointment (KENALOG) 0.5 % Apply 1 application topically as needed. 07/10/17   [provider]  venlafaxine XR (EFFEXOR XR) 75 MG 24 hr capsule Take 1 capsule (75 mg total) by mouth daily with breakfast. Take with Effexor XR 300 mg 11/24/22   Melony Overly T, PA-C  venlafaxine XR (EFFEXOR-XR) 150 MG 24 hr capsule Take 2 capsules (300 mg total) by mouth at bedtime. 11/24/22   Melony Overly T, PA-C      Allergies    Sulfa antibiotics, Albuterol, Other, Sulfamethoxazole-trimethoprim, Vicodin hp [hydrocodone-acetaminophen], Naproxen, and Vicodin [hydrocodone-acetaminophen]    Review of Systems   Review of Systems  Musculoskeletal:  Positive for arthralgias.  Neurological:  Positive for dizziness and light-headedness.  All other systems reviewed and are negative.   Physical Exam Updated Vital Signs BP (!) 155/113   Pulse 71   Temp 98.2 F (36.8 C) (Oral)   Resp 15   SpO2 95%  Physical Exam Vitals and nursing note reviewed.  Constitutional:      General: She is not in acute distress.    Appearance: Normal appearance. She is well-developed. She is ill-appearing (Chronically). She is not toxic-appearing or diaphoretic.  HENT:     Head: Normocephalic and atraumatic.     Right Ear: External ear normal.     Left Ear: External ear normal.     Nose: Nose normal.     Mouth/Throat:     Mouth: Mucous membranes are moist.  Eyes:     Extraocular Movements: Extraocular movements intact.     Conjunctiva/sclera: Conjunctivae normal.  Cardiovascular:     Rate and Rhythm: Normal rate and regular rhythm.     Heart sounds: No murmur heard. Pulmonary:     Effort: Pulmonary effort is normal. No respiratory distress.     Breath sounds: Normal breath sounds. No wheezing or rales.  Chest:     Chest wall: No tenderness.  Abdominal:     General: There is no distension.     Palpations: Abdomen is soft.     Tenderness: There is no abdominal tenderness.   Musculoskeletal:        General: No swelling. Normal range of motion.     Cervical back: Normal range of motion and neck supple.     Right lower leg: No edema.     Left lower leg: No edema.  Skin:    General: Skin is warm and dry.     Coloration: Skin is not jaundiced or pale.     Comments: Abrasions to bilateral knees  Neurological:     General: No focal deficit present.     Mental Status: She is alert and  oriented to person, place, and time.  Psychiatric:        Mood and Affect: Mood normal.        Behavior: Behavior normal.     ED Results / Procedures / Treatments   Labs (all labs ordered are listed, but only abnormal results are displayed) Labs Reviewed  CBC WITH DIFFERENTIAL/PLATELET - Abnormal; Notable for the following components:      Result Value   RBC 3.28 (*)    Hemoglobin 10.3 (*)    HCT 35.0 (*)    MCV 106.7 (*)    MCHC 29.4 (*)    Platelets 451 (*)    nRBC 0.4 (*)    Neutro Abs 7.8 (*)    All other components within normal limits  COMPREHENSIVE METABOLIC PANEL - Abnormal; Notable for the following components:   BUN 46 (*)    Creatinine, Ser 1.71 (*)    Total Protein 6.4 (*)    Albumin 2.8 (*)    GFR, Estimated 32 (*)    All other components within normal limits  MAGNESIUM - Abnormal; Notable for the following components:   Magnesium 2.9 (*)    All other components within normal limits  D-DIMER, QUANTITATIVE - Abnormal; Notable for the following components:   D-Dimer, Quant 1.99 (*)    All other components within normal limits  I-STAT CHEM 8, ED - Abnormal; Notable for the following components:   BUN 49 (*)    Creatinine, Ser 1.70 (*)    Calcium, Ion 1.13 (*)    TCO2 34 (*)    Hemoglobin 11.2 (*)    HCT 33.0 (*)    All other components within normal limits  I-STAT VENOUS BLOOD GAS, ED - Abnormal; Notable for the following components:   pO2, Ven 181 (*)    Bicarbonate 32.5 (*)    TCO2 34 (*)    Acid-Base Excess 7.0 (*)    Calcium, Ion 1.13 (*)     HCT 33.0 (*)    Hemoglobin 11.2 (*)    All other components within normal limits  TROPONIN I (HIGH SENSITIVITY) - Abnormal; Notable for the following components:   Troponin I (High Sensitivity) 47 (*)    All other components within normal limits  RESP PANEL BY RT-PCR (RSV, FLU A&B, COVID)  RVPGX2  URINALYSIS, ROUTINE W REFLEX MICROSCOPIC  BRAIN NATRIURETIC PEPTIDE  TROPONIN I (HIGH SENSITIVITY)  TROPONIN I (HIGH SENSITIVITY)    EKG EKG Interpretation  Date/Time:  Monday Nov 24 2022 11:13:43 EDT Ventricular Rate:  63 PR Interval:  207 QRS Duration: 108 QT Interval:  436 QTC Calculation: 447 R Axis:   91 Text Interpretation: Sinus rhythm Right axis deviation Confirmed by Alvester Chou [phone]) on 11/24/2022 5:26:04 PM  Radiology CT HEAD WO CONTRAST  Result Date: 11/24/2022 CLINICAL DATA:  Syncope/presyncope EXAM: CT HEAD WITHOUT CONTRAST TECHNIQUE: Contiguous axial images were obtained from the base of the skull through the vertex without intravenous contrast. RADIATION DOSE REDUCTION: This exam was performed according to the departmental dose-optimization program which includes automated exposure control, adjustment of the mA and/or kV according to patient size and/or use of iterative reconstruction technique. COMPARISON:  08/16/2019 FINDINGS: Brain: No acute intracranial findings are seen. Ventricles are not dilated. There are no signs of bleeding. There is no focal mass effect. Cortical sulci are prominent. Vascular: Coarse calcifications are seen arterial branches. Skull: No acute findings are seen. Sinuses/Orbits: There is mucosal thickening in the ethmoid sinus, especially in the posterior ethmoid sinus  on the right side. Other: There is increased amount of fluid density in sella suggesting possible partial empty sella. IMPRESSION: No acute intracranial findings are seen in noncontrast CT brain. Electronically Signed   By: Ernie Avena M.D.   On: 11/24/2022 13:00   DG Knee  2 Views Right  Result Date: 11/24/2022 CLINICAL DATA:  Fall.  Bilateral anterior knee abrasions. EXAM: RIGHT KNEE - 1-2 VIEW COMPARISON:  None Available. FINDINGS: There is diffuse decreased bone mineralization. Moderate to severe medial compartment joint space narrowing and mild-to-moderate peripheral osteophytosis. Mild peripheral lateral compartment degenerative osteophytosis without joint space narrowing. Moderate patellofemoral joint space narrowing and peripheral osteophytosis. A 5 mm well corticated chronic ossicle is seen inferior to the patella, overlying Hoffa's fat pad on lateral view. Small joint effusion. No acute fracture or dislocation. IMPRESSION: 1. Moderate-to-severe medial and moderate patellofemoral compartment osteoarthritis. 2. Small joint effusion. Electronically Signed   By: Neita Garnet M.D.   On: 11/24/2022 12:37   DG Knee 2 Views Left  Result Date: 11/24/2022 CLINICAL DATA:  Fall.  Bilateral knee anterior abrasions. EXAM: LEFT KNEE - 1-2 VIEW COMPARISON:  None Available. FINDINGS: There is diffuse decreased bone mineralization. Severe medial compartment joint space narrowing with bone-on-bone contact, subchondral sclerosis, and moderate peripheral osteophytosis. Mild peripheral glenoid degenerative osteophytosis without joint space narrowing. Severe patellofemoral joint space narrowing and peripheral osteophytosis. Well-corticated chronic 15 mm loose body overlying the distal anterior femoral metaphyseal cortex and the suprapatellar joint space. Small joint effusion. Mild-to-moderate chronic enthesopathic change at the quadriceps insertion on the patella. No acute fracture or dislocation. IMPRESSION: 1. Severe medial and patellofemoral compartment osteoarthritis. 2. Small joint effusion. 3. Chronic 15 mm loose body within the suprapatellar joint space. Electronically Signed   By: Neita Garnet M.D.   On: 11/24/2022 12:36   DG Chest Port 1 View  Result Date: 11/24/2022 CLINICAL  DATA:  Fall.  Shortness of breath. EXAM: PORTABLE CHEST 1 VIEW COMPARISON:  Chest radiographs 08/16/2019 and 09/04/2015 FINDINGS: Cardiac silhouette is moderately to markedly enlarged, stable to increased from 08/16/2019 most recent prior. Mediastinal contours are within normal limits. Calcification is again seen within the aortic arch. Left chest wall cardiac AICD with leads again overlying the right atrium and right ventricle. Mild-to-moderate bilateral interstitial thickening is mildly improved from 08/16/2019 and similar to 09/04/2015. No definite pleural effusion. No pneumothorax. Old healed bilateral rib fractures are grossly similar to prior. IMPRESSION: 1. Mild-to-moderate bilateral interstitial thickening, mildly improved from 08/16/2019 and similar to 09/04/2015. Findings may represent interstitial scarring. Improved, mild interstitial pulmonary edema is also possible. 2. Moderate to marked cardiomegaly, stable to increased from 08/16/2019. Electronically Signed   By: Neita Garnet M.D.   On: 11/24/2022 12:34    Procedures Procedures    Medications Ordered in ED Medications - No data to display  ED Course/ Medical Decision Making/ A&P                             Medical Decision Making Amount and/or Complexity of Data Reviewed Labs: ordered. Radiology: ordered.   This patient presents to the ED for concern of falls and generalized weakness, this involves an extensive number of treatment options, and is a complaint that carries with it a high risk of complications and morbidity.  The differential diagnosis includes orthostatic hypotension, polypharmacy, PE, infection, metabolic derangements, acute injuries   Co morbidities that complicate the patient evaluation   HTN, CHF, CVA, TBI,  anoxic brain injury, HLD, DVT   Additional history obtained:  Additional history obtained from EMS, patient's family External records from outside source obtained and reviewed including EMR   Lab  Tests:  I Ordered, and personally interpreted labs.  The pertinent results include: Although initial troponin was normal, it did increase on repeat.  Creatinine is slightly increased from baseline.  Hypermagnesemia is present with otherwise normal electrolytes.  Hemoglobin is slightly decreased from baseline and a new macrocytosis is present.  No leukocytosis is present.   Imaging Studies ordered:  I ordered imaging studies including x-ray of chest and bilateral knees, CT head I independently visualized and interpreted imaging which showed small bilateral knee joint effusions, no other acute findings I agree with the radiologist interpretation   Cardiac Monitoring: / EKG:  The patient was maintained on a cardiac monitor.  I personally viewed and interpreted the cardiac monitored which showed an underlying rhythm of: Sinus rhythm  Problem List / ED Course / Critical interventions / Medication management  Patient presents for ongoing falls which she describes as dizziness and loss of balance that typically occur in the mornings and with standing.  She most recently sustained a fall on Friday and has since been unable to walk.  She describes this as a combination of knee pain and generalized weakness.  She typically walks with a cane.  This weekend, husband bought her a walker but she has still required 2 person assist to ambulate.  EMS noted SpO2 in the 70s on room air.  She is not on oxygen at baseline.  She arrives on supplemental oxygen.  Patient denies any shortness of breath.  Lungs clear to auscultation.  Knees show very mild abrasions with no deformities and no swelling.  Diagnostic workup was initiated.  On x-ray imaging of knees, there are small bilateral joint effusions.  I have low suspicion of septic arthritis.  There were some subtle lab findings: Hemoglobin is slightly decreased from baseline and a macrocytosis is now present.  Initial troponin was normal, however, second troponin  increased to 47.  Given her hypoxia, this raises concern of PE.  Patient has a documented history of DVT.  When questioned about this, she denies knowledge of blood clots in her legs in the past.  She states that she was previously on Eliquis following her stroke.  She is not sure why she was taken off of this, however, family believes that it was because she has been a fall risk.  D-dimer was ordered and was pending at time of signout.  Patient remained on 3 L of supplemental oxygen with SpO2 in the high 90s.  Patient will require admission.  Care of patient was signed out to oncoming ED provider.   Social Determinants of Health:  Lives at home with family        Final Clinical Impression(s) / ED Diagnoses Final diagnoses:  Fall, initial encounter  Generalized weakness  Acute respiratory failure with hypoxia [facility])    Rx / DC Orders ED Discharge Orders     None         Gloris Manchester, MD 11/24/22 1816

## 2022-11-24 NOTE — ED Notes (Signed)
Pt refused nasal respiratory swab. Pt educated about the suspect for respiratory infection due to SOB. Pt verbalizes understanding and refused swab.

## 2022-11-24 NOTE — H&P (Addendum)
History and Physical    Patient: Marie Ramos ZOX:[id number] DOB: 1956/02/17 DOA: 11/24/2022 DOS: the patient was seen and examined on 11/24/2022 PCP: Daisy Floro, MD  Patient coming from: Home  Chief Complaint:  Chief Complaint  Patient presents with   Fall   HPI: Marie Ramos is a 67 y.o. female with medical history significant for cardiac arrest now with a ICD, sick brain injury from cardiac arrest, bipolar disorder, stage IIIa chronic kidney disease, chronic diastolic congestive heart failure and BMI of 47 who was brought in for evaluation for multiple falls.  The patient has fallen approximately 6 times in the last 2 months.  Generally she has not injured herself she gets back up until her last fall which was 3 days ago on Friday.  Since that fall the patient has not been able to get up she has been on the couch and the family has been bringing her a bedpan.  She is too weak to get off the couch at all.  This has never happened before.  She has been having trouble with increased weakness and increased falls for the last 2 months since her Trileptal dose was increased.  She has also had increased tremors from this medication.  The patient had a telemedicine psychiatry visit today and her medication dose was decreased.  The family is unable to manage her since she is not able to walk at this time.  Denies any pain just seems to be too weak to get up. The patient was also incontinent of urine today and seem to not even know that she was incontinent.  That is definitely not her baseline. Evaluation in the emergency department revealed that her O2 sats were in the 80s.  It took 3 L O2 nasal cannula to get her sats back above 92.  She was also felt to be volume overloaded.  The patient is followed by [facility] cardiology and recently has been battling some volume overload.  Her Lasix dose has been increased as an outpatient and she is currently taking 40 mg p.o. twice daily.  The  patient is being admitted for CHF exacerbation with hypoxia and weakness and recurrent falls.   Review of Systems: unable to review all systems due to the inability of the patient to answer questions. Past Medical History:  Diagnosis Date   Abnormality of gait    Acute bilat watershed infarction [facility])    Acute lower UTI    Acute metabolic encephalopathy 08/16/2019   Acute respiratory failure (HCC)    Acute systolic congestive heart failure (HCC)    AKI (acute kidney injury) (HCC)    Anoxic brain damage (HCC)    Bipolar 1 disorder (HCC)    Bipolar affective disorder in remission (HCC)    Cardiac arrest (HCC) 08/24/2015   Cerebral infarction due to embolism of cerebral artery (HCC)    Cerebrovascular accident (CVA) due to embolism of cerebral artery (HCC) 12/05/2015   Chronically dry eyes    Closed fracture of left distal radius 12/01/2018   DVT (deep venous thrombosis) (HCC) 12/05/2015   Dyspnea    r/t seasonal allergies   Hypertrophic obstructive cardiomyopathy (HCC) 08/29/2015   Leukocytosis    Macular degeneration    Pneumonia due to COVID-19 virus 08/16/2019   Pulmonary vascular congestion 08/16/2019   QT prolongation 08/29/2015   Stroke (HCC)    Systolic dysfunction with acute on chronic heart failure (HCC)    TBI (traumatic brain injury) (HCC) 2011  Inpatient rehab [facility]   Thrombocytosis    Ventricular fibrillation (HCC) 09/03/15   MDT ICD Dr. Johney Frame   Past Surgical History:  Procedure Laterality Date   ABDOMINAL HYSTERECTOMY     CARDIAC CATHETERIZATION N/A 08/24/2015   Procedure: Left Heart Cath and Coronary Angiography;  Surgeon: Tonny Bollman, MD;  Location: [facility] INVASIVE CV LAB;  Service: Cardiovascular;  Laterality: N/A;   EP IMPLANTABLE DEVICE N/A 09/03/2015   MDT dual chamber ICD   OPEN REDUCTION INTERNAL FIXATION (ORIF) DISTAL RADIAL FRACTURE Left 12/01/2018   Procedure: OPEN REDUCTION INTERNAL FIXATION (ORIF) LEFT DISTAL RADIAL FRACTURE;  Surgeon: Bjorn Pippin, MD;   Location: [facility] ORS;  Service: Orthopedics;  Laterality: Left;   SPLENECTOMY, TOTAL  2011   TONSILLECTOMY     Social History:  reports that she has never smoked. She has never used smokeless tobacco. She reports that she does not drink alcohol and does not use drugs.  Allergies  Allergen Reactions   Sulfa Antibiotics Itching    Face neck red   Albuterol Itching   Other Hives   Sulfamethoxazole-Trimethoprim Hives   Vicodin Hp [Hydrocodone-Acetaminophen] Hives   Naproxen Itching   Vicodin [Hydrocodone-Acetaminophen] Itching    Family History  Problem Relation Age of Onset   Cancer Mother    Depression Mother    CAD Mother    COPD Father    Depression Father    Diabetes Maternal Grandfather    Depression Maternal Grandfather    Arthritis/Rheumatoid Paternal Grandmother     Prior to Admission medications   Medication Sig Start Date End Date Taking? Authorizing Provider  Acetylcysteine (NAC) 600 MG CAPS Take 1 capsule (600 mg total) by mouth daily. 06/03/21  Yes Hurst, Glade Nurse, PA-C  ALPRAZolam Prudy Feeler) 0.25 MG tablet Take one-half to one tablet by  mouth 3 times daily as needed for anxiety Patient taking differently: Take 0.5 mg by mouth at bedtime as needed for anxiety or sleep. 10/24/22  Yes Melony Overly T, PA-C  amLODipine (NORVASC) 5 MG tablet Take 1 tablet (5 mg total) by mouth daily with breakfast. 10/11/15  Yes Marcello Fennel, MD  Ascorbic Acid (VITAMIN C PO) Take 1 capsule by mouth daily.   Yes [provider]  aspirin EC 81 MG tablet Take 1 tablet (81 mg total) by mouth daily. Swallow whole. 11/08/21  Yes Orbie Pyo, MD  carvedilol (COREG) 12.5 MG tablet TAKE ONE TABLET BY MOUTH TWICE DAILY WITH BREAKFAST AND DINNER 05/09/19  Yes Sheilah Pigeon, PA-C  cholecalciferol (VITAMIN D3) 25 MCG (1000 UNIT) tablet Take 2,000 Units by mouth daily.   Yes [provider]  famotidine (PEPCID) 20 MG tablet Take 20 mg by mouth at bedtime.   Yes [provider]  fexofenadine (ALLEGRA) 180 MG tablet Take 180 mg by mouth daily.   Yes [provider]  fluticasone (FLONASE) 50 MCG/ACT nasal spray Place 1 spray into both nostrils daily. Use as directed   Yes [provider]  furosemide (LASIX) 40 MG tablet Take 1 tablet (40 mg total) by mouth daily. Patient taking differently: Take 40 mg by mouth 2 (two) times daily. Increased 09/19/22 - pt will call when she needs a new rx 12/30/21  Yes Orbie Pyo, MD  Ginkgo Biloba 40 MG TABS Take 1 tablet (40 mg total) by mouth 2 (two) times daily. 06/03/21  Yes Melony Overly T, PA-C  losartan (COZAAR) 100 MG tablet Take 1 tablet (100 mg total) by mouth daily.  06/23/16  Yes Allred, Fayrene Fearing, MD  Multiple Vitamin (MULTIVITAMIN WITH MINERALS) TABS tablet Take 1 tablet by mouth daily.   Yes [provider]  Multiple Vitamins-Minerals (OCUVITE PO) Take 1 capsule by mouth daily.   Yes [provider]  Oxcarbazepine (TRILEPTAL) 300 MG tablet Take two tablets by mouth every morning, and take one tablet in the evening Patient taking differently: Take 600 mg by mouth daily. Take two tablets by mouth every morning, and take one-half tablet in the evening 11/24/22  Yes Hurst, Teresa T, PA-C  venlafaxine XR (EFFEXOR XR) 75 MG 24 hr capsule Take 1 capsule (75 mg total) by mouth daily with breakfast. Take with Effexor XR 300 mg 11/24/22  Yes Hurst, Teresa T, PA-C  venlafaxine XR (EFFEXOR-XR) 150 MG 24 hr capsule Take 2 capsules (300 mg total) by mouth at bedtime. 11/24/22  Yes Melony Overly T, PA-C  atorvastatin (LIPITOR) 20 MG tablet Take 1 tablet (20 mg total) by mouth daily at 6 PM. 10/11/15   Patel, Maryln Gottron, MD  Risankizumab-rzaa (SKYRIZI, 150 MG DOSE, [place]) Inject into the skin every 3 (three) months. Patient not taking: Reported on 08/21/2022    [provider]  triamcinolone ointment (KENALOG) 0.5 % Apply 1 application topically as needed. 07/10/17   [provider]     Physical Exam: Vitals:   11/24/22 2010 11/24/22 2015 11/24/22 2024 11/24/22 2030  BP:  (!) 150/76  (!) 143/72  Pulse:  70  70  Resp:  15  19  Temp:      TempSrc:      SpO2:  94% 97% 96%  Weight: 112.5 kg     Height: 5\' 2"  (1.575 m)       Data Reviewed:  Results for orders placed or performed during the hospital encounter of 11/24/22 (from the past 24 hour(s))  CBC WITH DIFFERENTIAL     Status: Abnormal   Collection Time: 11/24/22 12:07 PM  Result Value Ref Range   WBC 9.9 4.0 - 10.5 K/uL   RBC 3.28 (L) 3.87 - 5.11 MIL/uL   Hemoglobin 10.3 (L) 12.0 - 15.0 g/dL   HCT 13.0 (L) 86.5 - 78.4 %   MCV 106.7 (H) 80.0 - 100.0 fL   MCH 31.4 26.0 - 34.0 pg   MCHC 29.4 (L) 30.0 - 36.0 g/dL   RDW 69.6 29.5 - 28.4 %   Platelets 451 (H) 150 - 400 K/uL   nRBC 0.4 (H) 0.0 - 0.2 %   Neutrophils Relative % 80 %   Neutro Abs 7.8 (H) 1.7 - 7.7 K/uL   Lymphocytes Relative 9 %   Lymphs Abs 0.9 0.7 - 4.0 K/uL   Monocytes Relative 9 %   Monocytes Absolute 0.9 0.1 - 1.0 K/uL   Eosinophils Relative 2 %   Eosinophils Absolute 0.2 0.0 - 0.5 K/uL   Basophils Relative 0 %   Basophils Absolute 0.0 0.0 - 0.1 K/uL   Immature Granulocytes 0 %   Abs Immature Granulocytes 0.04 0.00 - 0.07 K/uL  Comprehensive metabolic panel     Status: Abnormal   Collection Time: 11/24/22 12:07 PM  Result Value Ref Range   Sodium 138 135 - 145 mmol/L   Potassium 4.9 3.5 - 5.1 mmol/L   Chloride 101 98 - 111 mmol/L   CO2 28 22 - 32 mmol/L   Glucose, Bld 72 70 - 99 mg/dL   BUN 46 (H) 8 - 23 mg/dL   Creatinine, Ser 1.32 (H) 0.44 - 1.00  mg/dL   Calcium 8.9 8.9 - 16.1 mg/dL   Total Protein 6.4 (L) 6.5 - 8.1 g/dL   Albumin 2.8 (L) 3.5 - 5.0 g/dL   AST 23 15 - 41 U/L   ALT 24 0 - 44 U/L   Alkaline Phosphatase 95 38 - 126 U/L   Total Bilirubin 0.5 0.3 - 1.2 mg/dL   GFR, Estimated 32 (L) >60 mL/min   Anion gap 9 5 - 15  Magnesium     Status: Abnormal   Collection Time: 11/24/22 12:07 PM  Result Value Ref Range    Magnesium 2.9 (H) 1.7 - 2.4 mg/dL  Troponin I (High Sensitivity)     Status: None   Collection Time: 11/24/22 12:07 PM  Result Value Ref Range   Troponin I (High Sensitivity) 16 <18 ng/L  Troponin I (High Sensitivity)     Status: Abnormal   Collection Time: 11/24/22 12:07 PM  Result Value Ref Range   Troponin I (High Sensitivity) 47 (H) <18 ng/L  I-Stat Chem 8, ED     Status: Abnormal   Collection Time: 11/24/22 12:19 PM  Result Value Ref Range   Sodium 139 135 - 145 mmol/L   Potassium 4.9 3.5 - 5.1 mmol/L   Chloride 104 98 - 111 mmol/L   BUN 49 (H) 8 - 23 mg/dL   Creatinine, Ser 0.96 (H) 0.44 - 1.00 mg/dL   Glucose, Bld 74 70 - 99 mg/dL   Calcium, Ion 0.45 (L) 1.15 - 1.40 mmol/L   TCO2 34 (H) 22 - 32 mmol/L   Hemoglobin 11.2 (L) 12.0 - 15.0 g/dL   HCT 40.9 (L) 81.1 - 91.4 %  I-Stat venous blood gas, ([facility] ED, [facility], [facility])     Status: Abnormal   Collection Time: 11/24/22 12:19 PM  Result Value Ref Range   pH, Ven 7.413 7.25 - 7.43   pCO2, Ven 50.9 44 - 60 mmHg   pO2, Ven 181 (H) 32 - 45 mmHg   Bicarbonate 32.5 (H) 20.0 - 28.0 mmol/L   TCO2 34 (H) 22 - 32 mmol/L   O2 Saturation 100 %   Acid-Base Excess 7.0 (H) 0.0 - 2.0 mmol/L   Sodium 138 135 - 145 mmol/L   Potassium 4.9 3.5 - 5.1 mmol/L   Calcium, Ion 1.13 (L) 1.15 - 1.40 mmol/L   HCT 33.0 (L) 36.0 - 46.0 %   Hemoglobin 11.2 (L) 12.0 - 15.0 g/dL   Sample type VENOUS   D-dimer, quantitative     Status: Abnormal   Collection Time: 11/24/22  5:31 PM  Result Value Ref Range   D-Dimer, Quant 1.99 (H) 0.00 - 0.50 ug/mL-FEU  Troponin I (High Sensitivity)     Status: None   Collection Time: 11/24/22  5:31 PM  Result Value Ref Range   Troponin I (High Sensitivity) 13 <18 ng/L  Urinalysis, Routine w reflex microscopic -Urine, Clean Catch     Status: Abnormal   Collection Time: 11/24/22  7:45 PM  Result Value Ref Range   Color, Urine YELLOW YELLOW   APPearance HAZY (A) CLEAR   Specific Gravity, Urine 1.014 1.005 - 1.030   pH 5.0  5.0 - 8.0   Glucose, UA NEGATIVE NEGATIVE mg/dL   Hgb urine dipstick NEGATIVE NEGATIVE   Bilirubin Urine NEGATIVE NEGATIVE   Ketones, ur NEGATIVE NEGATIVE mg/dL   Protein, ur 782 (A) NEGATIVE mg/dL   Nitrite NEGATIVE NEGATIVE   Leukocytes,Ua NEGATIVE NEGATIVE   RBC / HPF 0-5 0 - 5 RBC/hpf  WBC, UA 0-5 0 - 5 WBC/hpf   Bacteria, UA RARE (A) NONE SEEN   Squamous Epithelial / HPF 6-10 0 - 5 /HPF   Mucus PRESENT    Hyaline Casts, UA PRESENT   Troponin I (High Sensitivity)     Status: None   Collection Time: 11/24/22  7:59 PM  Result Value Ref Range   Troponin I (High Sensitivity) 16 <18 ng/L  Brain natriuretic peptide     Status: Abnormal   Collection Time: 11/24/22  7:59 PM  Result Value Ref Range   B Natriuretic Peptide 120.2 (H) 0.0 - 100.0 pg/mL   Physical Exam:  General: No acute distress,obese, chronically ill appearing HEENT: Normocephalic, atraumatic, PER Cardiovascular: Normal rate and rhythm. Distal pulses intact. Pulmonary: Normal pulmonary effort, normal breath sounds Gastrointestinal: Nondistended abdomen, soft, non-tender, normoactive bowel sounds, no organomegaly Musculoskeletal:no lower ext edema Lymphadenopathy: No cervical LAD. Skin: Skin is warm and dry. Neuro: No focal deficits noted, AAO but poor memory PSYCH: Attentive and cooperative, Cannot recall events form earlier today in detail   Assessment and Plan: # CHF Exacerbation/acute hypoxic respiratory failure now requiring 3 L O2 nasal cannula -The patient has been on an increased dose of Lasix at home we will convert her now to IV Lasix. -Wean oxygen as tolerated - Her sats drop more when sleeping. Consider sleep study  # Elevated D-dimer-her D-dimer was minimally elevated but since she did present with hypoxia a VQ scan has been ordered for the a.m. Patient is not on long-term anticoagulation because of multiple falls. She was not initially placed on full anticoagulation empirically as her clinical  likelihood of DVT was felt to be low.  # Multiple Falls - Likely multifactorial.  The patient started having increased falls after her Trileptal dose was increased 2 months ago.  Her psychiatrist has decreased the dose as of today. Before Friday when the patient would fall she would just get up but since Friday she has not been able to get up.  She has not been able to leave the couch essentially.  The hypoxia may have something to do with that. - PT/ OT assessment.  I suspect the patient may need inpatient rehab  # History of anoxic brain injury post cardiac arrest as well as strokes - the daughter feels that the patient's memory is getting worse.  Monitor  # Bipolar disorder- continue routine psychiatric meds  # Morbid obesity  # Borderline DM - Daughter worried about borderline DM.  Check HgA1c   Advance Care Planning:   Code Status: Full Code  The patient, the patient has been, and the patient's daughter all agree that the patient should be full code.  The patient's husband is her healthcare power of attorney.  Consults: Consider cardiology.  She follows with [facility] cardiology  Family Communication: Patient's husband and daughter were present in the room  Severity of Illness: The appropriate patient status for this patient is INPATIENT. Inpatient status is judged to be reasonable and necessary in order to provide the required intensity of service to ensure the patient's safety. The patient's presenting symptoms, physical exam findings, and initial radiographic and laboratory data in the context of their chronic comorbidities is felt to place them at high risk for further clinical deterioration. Furthermore, it is not anticipated that the patient will be medically stable for discharge from the hospital within 2 midnights of admission.   * I certify that at the point of admission it is my clinical judgment that  the patient will require inpatient hospital care spanning beyond 2 midnights from  the point of admission due to high intensity of service, high risk for further deterioration and high frequency of surveillance required.*  Author: Buena Irish, MD 11/24/2022 9:27 PM  For on call review [email or web address].

## 2022-11-24 NOTE — ED Provider Notes (Signed)
67 yo female w/ hx of Dvt, TBI, cardiac arrest, presenting from home with balance difficulties, falling   Physical Exam  BP 131/62   Pulse 71   Resp 15   SpO2 93%   Physical Exam  Procedures  Procedures  ED Course / MDM   Clinical Course as of 11/24/22 1854  Mon Nov 24, 2022  1853 Admitted to hospitalist, unclear why BNP did not result, I asked nurse to clarify with lab or redraw this level [MT]    Clinical Course User Index [MT] Ysenia Filice, Kermit Balo, MD   Medical Decision Making Amount and/or Complexity of Data Reviewed Labs: ordered. Radiology: ordered.  Risk Prescription drug management.   Ddimer chronically elevated.  We are awaiting vascular ultrasound.  If there is concern for acute DVT it would be reasonable to initiate anticoagulation in the hospital.  However it appears this has not been started due to concern for fall risk at home.  In the meantime of ordered diuretics as the patient does have some pulmonary edema and history of congestive heart failure.  She was admitted in stable condition to the hospitalist on 2 to 3 L oxygen as a new oxygen requirement       Terald Sleeper, MD 11/24/22 [phone]

## 2022-11-24 NOTE — Progress Notes (Signed)
[facility]  Patient ID: DEMETRISS KO,  MRN: [id number]  PCP: Daisy Floro, MD  Date of Evaluation: 11/24/2022 Time spent:30 minutes  Chief Complaint:  Chief Complaint   Anxiety; Depression; Memory Loss   Virtual Visit via Telehealth  I connected with patient by telephone, with their informed consent, and verified patient privacy and that I am speaking with the correct person using two identifiers.  I am private, in my office and the patient is at home.  I discussed the limitations, risks, security and privacy concerns of performing an evaluation and management service by telephone and the availability of in person appointments. I also discussed with the patient that there may be a patient responsible charge related to this service. The patient expressed understanding and agreed to proceed.   I discussed the assessment and treatment plan with the patient. The patient was provided an opportunity to ask questions and all were answered. The patient agreed with the plan and demonstrated an understanding of the instructions.   The patient was advised to call back or seek an in-person evaluation if the symptoms worsen or if the condition fails to improve as anticipated.  I provided 30 minutes of non-face-to-face time during this encounter.  HISTORY/CURRENT STATUS: For routine 2 month med check.  She is not doing well.  Gala Romney, is also on the call.   Since her last visit she has had 6-10 falls.  Says she gets dizzy and then falls.  She has fallen at home, fallen into the car when she was getting in, has fallen at work a couple of times.  She is a Lawyer.  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  She fell on her face one of the times, had a lot of bruising but no medical care was needed.  This last fall on 11/21/2022, 3 days ago, has been the worst, she is having trouble walking so after this appointment they are going to [facility] hospital ER for evaluation.  Also  reports memory loss.  It is hard to say whether this was occurring prior to the falls or before.  An example is not able to remember her pin number to get into her phone.  Also reports tremor in her hands bilaterally, worse since last visit.  As far as her mood goes she has been doing well.  No symptoms of depression at all. Patient is able to enjoy things.  Energy and motivation are good.  Work is going well.  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  She does not work daily.  No extreme sadness, tearfulness, or feelings of hopelessness.  Sleeps well most of the time.  Only takes the Xanax at bedtime to help her relax to go to sleep.  States the dizziness can be any time of the day, it does not happen after she takes Xanax.  Personal hygiene is normal.  ADLs were within normal limits prior to the falls.  Now her husband is doing the laundry, cooking, etc.  Appetite has not changed.  Weight is stable.  Not having anxiety during the day.  No panic attacks.  Denies suicidal or homicidal thoughts.  She has been on oxcarbazepine for several years, but dose was increased in January because of manic behavior.  It was dulling her emotions so the dose was decreased from 1200 mg down to 1050 mg total daily in February.  She has been on that dose since then.  The Effexor has not changed in several years.  No other recent med changes.  She has known heart disease, psych medications have been discussed with her pharmacist at the cardiologist in the past.  She follows up with cardiology frequently.  Patient denies increased energy with decreased need for sleep, increased talkativeness, racing thoughts, impulsivity or risky behaviors, increased spending, increased libido, grandiosity, increased irritability or anger, paranoia, or hallucinations.  Review of Systems  Constitutional: Negative.   HENT: Negative.    Eyes: Negative.   Respiratory: Negative.    Cardiovascular:  Negative.   Gastrointestinal: Negative.   Genitourinary: Negative.   Musculoskeletal: Negative.   Skin: Negative.   Neurological:        See HPI  Endo/Heme/Allergies: Negative.   Psychiatric/Behavioral:         See HPI   Individual Medical History/ Review of Systems: Changes? :Yes   has had 6-10 falls in past 2 months.  Has fallen at work, Saw PCP a couple of times.  Last fall was 3 days ago.  See HPI.  Past medications for mental health diagnoses include: Lithium caused diarrhea and increased thirst, Trileptal, Effexor XR, Seroquel worked well, Abilify unknown what happened, Adderall, Risperdal helped mania but caused bilateral lower extremity edema and dry mouth.  Allergies: Sulfa antibiotics, Albuterol, Other, Sulfamethoxazole-trimethoprim, Vicodin hp [hydrocodone-acetaminophen], Naproxen, and Vicodin [hydrocodone-acetaminophen]  Current Medications:  Current Outpatient Medications:    Acetylcysteine (NAC) 600 MG CAPS, Take 1 capsule (600 mg total) by mouth daily., Disp: 30 capsule, Rfl: 11   ALPRAZolam (XANAX) 0.25 MG tablet, Take one-half to one tablet by  mouth 3 times daily as needed for anxiety (Patient taking differently: Take 0.5 mg by mouth at bedtime.), Disp: 90 tablet, Rfl: 0   amLODipine (NORVASC) 5 MG tablet, Take 1 tablet (5 mg total) by mouth daily with breakfast., Disp: 30 tablet, Rfl: 0   Ascorbic Acid (VITAMIN C PO), Take 1 capsule by mouth daily., Disp: , Rfl:    aspirin EC 81 MG tablet, Take 1 tablet (81 mg total) by mouth daily. Swallow whole., Disp: 90 tablet, Rfl: 3   atorvastatin (LIPITOR) 20 MG tablet, Take 1 tablet (20 mg total) by mouth daily at 6 PM., Disp: 30 tablet, Rfl: 0   carvedilol (COREG) 12.5 MG tablet, TAKE ONE TABLET BY MOUTH TWICE DAILY WITH BREAKFAST AND DINNER, Disp: 180 tablet, Rfl: 2   cholecalciferol (VITAMIN D3) 25 MCG (1000 UNIT) tablet, Take 2,000 Units by mouth daily., Disp: , Rfl:    famotidine (PEPCID) 20 MG tablet, Take 20 mg by mouth  at bedtime., Disp: , Rfl:    fexofenadine (ALLEGRA) 180 MG tablet, Take 180 mg by mouth daily., Disp: , Rfl:    fluticasone (FLONASE) 50 MCG/ACT nasal spray, Place 1 spray into both nostrils daily. Use as directed, Disp: , Rfl:    furosemide (LASIX) 40 MG tablet, Take 1 tablet (40 mg total) by mouth daily. (Patient taking differently: Take 40 mg by mouth 2 (two) times daily. Increased 09/19/22 - pt will call when she needs a new rx), Disp: 90 tablet, Rfl: 3   Ginkgo Biloba 40 MG TABS, Take 1 tablet (40 mg total) by mouth 2 (two) times daily., Disp: 180 tablet, Rfl: 11   losartan (COZAAR) 100 MG tablet, Take 1 tablet (100 mg total) by mouth daily., Disp: 90 tablet, Rfl: 3   Multiple Vitamin (MULTIVITAMIN WITH MINERALS) TABS tablet, Take 1 tablet by mouth daily., Disp: , Rfl:    Multiple Vitamins-Minerals (  OCUVITE PO), Take 1 capsule by mouth daily., Disp: , Rfl:    triamcinolone ointment (KENALOG) 0.5 %, Apply 1 application topically as needed., Disp: , Rfl: 0   Oxcarbazepine (TRILEPTAL) 300 MG tablet, Take two tablets by mouth every morning, and take one tablet in the evening, Disp: 105 tablet, Rfl: 0   Risankizumab-rzaa (SKYRIZI, 150 MG DOSE, [place]), Inject into the skin every 3 (three) months. (Patient not taking: Reported on 08/21/2022), Disp: , Rfl:    venlafaxine XR (EFFEXOR XR) 75 MG 24 hr capsule, Take 1 capsule (75 mg total) by mouth daily with breakfast. Take with Effexor XR 300 mg, Disp: 90 capsule, Rfl: 1   venlafaxine XR (EFFEXOR-XR) 150 MG 24 hr capsule, Take 2 capsules (300 mg total) by mouth at bedtime., Disp: 180 capsule, Rfl: 1 Medication Side Effects: none  Family Medical/ Social History: Changes?  no  MENTAL HEALTH EXAM:  There were no vitals taken for this visit.There is no height or weight on file to calculate BMI.  General Appearance:  Unable to assess  Eye Contact:   Unable to assess  Speech:  Clear and Coherent and Normal Rate  Volume:  Normal  Mood:  Euthymic  Affect:    Unable to assess        Thought Process:  Goal Directed and Descriptions of Associations: Circumstantial  Orientation:  Full (Time, Place, and Person)  Thought Content: Logical   Suicidal Thoughts:  No  Homicidal Thoughts:  No  Memory:  Immediate;   Fair Recent;   Fair Remote;   Good  Judgement:  Good  Insight:  Good  Psychomotor Activity:   Unable to assess  Concentration:  Concentration: Good and Attention Span: Good  Recall:  Good  Fund of Knowledge: Good  Language: Good  Assets:  Desire for Improvement Financial Resources/Insurance Housing Social Support Transportation  ADL's:  Intact  Cognition: WNL  Prognosis:  Good   DIAGNOSES:    ICD-10-CM   1. Bipolar I disorder (HCC)  F31.9     2. Generalized anxiety disorder  F41.1     3. Multiple falls  R29.6     4. Insomnia due to other mental disorder  F51.05    F99     5. Chronic illness  R69     6. QT prolongation  R94.31     7. Tremor due to multiple drugs  G25.1     8. Memory change  R41.3       Receiving Psychotherapy: No   RECOMMENDATIONS:  PDMP reviewed. Last Xanax filled 10/24/2022. I provided 30 minutes of non-face-to-face time during this encounter, including time spent before and after the visit in records review, medical decision making, counseling pertinent to today's visit, and charting.   She is going to the ER after this appointment.  We discussed the falls.  The oxcarbazepine could be the cause, or at least in part.  It can cause ataxia, recommend decreasing the dose from a total of 1050 mg daily to 900 mg daily.  Depending on the outcome of the ER visit, I may decrease the dose further, we will decide at our appointment next week.  The Xanax can cause falls and confusion but she is only taking it at night so she can relax to go to sleep.  It does not seem to be related at this time.  She understands the risks associated with any benzodiazepine for a person over 22 or 67 years old.  At this time I  do not think it is necessary to stop that drug.  Continue Xanax 0.25 mg, 1 p.o. nightly as needed sleep or anxiety. Decrease oxcarbazepine 300 mg to 2 p.o. every morning and 1 p.o. nightly. Continue Effexor XR 150 mg, 2 p.o. daily plus Effexor XR 75 mg, 1 p.o. daily. Continue NAC, multivitamin, B complex, fish oil, vitamin D 2000 IUs daily, ginkgo biloba. Return in 1 week.  Avoid drugs that are known to cause prolonged QT interval  Melony Overly, PA-C

## 2022-11-25 ENCOUNTER — Inpatient Hospital Stay (HOSPITAL_COMMUNITY): Payer: Medicare HMO

## 2022-11-25 DIAGNOSIS — G931 Anoxic brain damage, not elsewhere classified: Secondary | ICD-10-CM | POA: Diagnosis not present

## 2022-11-25 DIAGNOSIS — I5033 Acute on chronic diastolic (congestive) heart failure: Secondary | ICD-10-CM | POA: Diagnosis not present

## 2022-11-25 DIAGNOSIS — N179 Acute kidney failure, unspecified: Secondary | ICD-10-CM

## 2022-11-25 DIAGNOSIS — N189 Chronic kidney disease, unspecified: Secondary | ICD-10-CM

## 2022-11-25 DIAGNOSIS — F3170 Bipolar disorder, currently in remission, most recent episode unspecified: Secondary | ICD-10-CM | POA: Diagnosis not present

## 2022-11-25 LAB — POCT I-STAT EG7
Acid-Base Excess: 8 mmol/L — ABNORMAL HIGH (ref 0.0–2.0)
Acid-Base Excess: 8 mmol/L — ABNORMAL HIGH (ref 0.0–2.0)
Bicarbonate: 36.6 mmol/L — ABNORMAL HIGH (ref 20.0–28.0)
Bicarbonate: 36.8 mmol/L — ABNORMAL HIGH (ref 20.0–28.0)
Calcium, Ion: 1.23 mmol/L (ref 1.15–1.40)
Calcium, Ion: 1.19 mmol/L (ref 1.15–1.40)
HCT: 35 % — ABNORMAL LOW (ref 36.0–46.0)
HCT: 36 % (ref 36.0–46.0)
Hemoglobin: 11.9 g/dL — ABNORMAL LOW (ref 12.0–15.0)
Hemoglobin: 12.2 g/dL (ref 12.0–15.0)
O2 Saturation: 73 %
O2 Saturation: 97 %
Potassium: 4.4 mmol/L (ref 3.5–5.1)
Potassium: 4.6 mmol/L (ref 3.5–5.1)
Sodium: 143 mmol/L (ref 135–145)
Sodium: 142 mmol/L (ref 135–145)
TCO2: 39 mmol/L — ABNORMAL HIGH (ref 22–32)
TCO2: 39 mmol/L — ABNORMAL HIGH (ref 22–32)
pCO2, Ven: 72.4 mmHg (ref 44–60)
pCO2, Ven: 72.4 mmHg (ref 44–60)
pH, Ven: 7.311 (ref 7.25–7.43)
pH, Ven: 7.314 (ref 7.25–7.43)
pO2, Ven: 44 mmHg (ref 32–45)
pO2, Ven: 109 mmHg — ABNORMAL HIGH (ref 32–45)

## 2022-11-25 LAB — COMPREHENSIVE METABOLIC PANEL
ALT: 49 U/L — ABNORMAL HIGH (ref 0–44)
AST: 50 U/L — ABNORMAL HIGH (ref 15–41)
Albumin: 2.9 g/dL — ABNORMAL LOW (ref 3.5–5.0)
Alkaline Phosphatase: 107 U/L (ref 38–126)
Anion gap: 10 (ref 5–15)
BUN: 45 mg/dL — ABNORMAL HIGH (ref 8–23)
CO2: 33 mmol/L — ABNORMAL HIGH (ref 22–32)
Calcium: 9.2 mg/dL (ref 8.9–10.3)
Chloride: 101 mmol/L (ref 98–111)
Creatinine, Ser: 1.84 mg/dL — ABNORMAL HIGH (ref 0.44–1.00)
GFR, Estimated: 30 mL/min — ABNORMAL LOW (ref >60)
Glucose, Bld: 117 mg/dL — ABNORMAL HIGH (ref 70–99)
Potassium: 4.3 mmol/L (ref 3.5–5.1)
Sodium: 144 mmol/L (ref 135–145)
Total Bilirubin: 0.3 mg/dL (ref 0.3–1.2)
Total Protein: 6.9 g/dL (ref 6.5–8.1)

## 2022-11-25 LAB — HIV ANTIBODY (ROUTINE TESTING W REFLEX): HIV Screen 4th Generation wRfx: NONREACTIVE

## 2022-11-25 LAB — HEMOGLOBIN A1C
Hgb A1c MFr Bld: 6.2 % — ABNORMAL HIGH (ref 4.8–5.6)
Mean Plasma Glucose: 131.24 mg/dL

## 2022-11-25 LAB — FOLATE: Folate: 27.3 ng/mL (ref >5.9)

## 2022-11-25 LAB — VITAMIN B12: Vitamin B-12: 685 pg/mL (ref 180–914)

## 2022-11-25 MED ORDER — TECHNETIUM TO 99M ALBUMIN AGGREGATED
4.6000 | Freq: Once | INTRAVENOUS | Status: AC | PRN
  Administered 2022-11-25: 4.6 via INTRAVENOUS

## 2022-11-25 MED ORDER — FUROSEMIDE 10 MG/ML IJ SOLN
60.0000 mg | Freq: Two times a day (BID) | INTRAMUSCULAR | Status: DC
  Administered 2022-11-25 – 2022-11-26 (×2): 60 mg via INTRAVENOUS
  Filled 2022-11-25 (×2): qty 6

## 2022-11-25 MED ORDER — ENOXAPARIN SODIUM 120 MG/0.8ML IJ SOSY
120.0000 mg | PREFILLED_SYRINGE | Freq: Two times a day (BID) | INTRAMUSCULAR | Status: DC
  Administered 2022-11-25 (×2): 120 mg via SUBCUTANEOUS
  Filled 2022-11-25 (×2): qty 0.8

## 2022-11-25 NOTE — Progress Notes (Signed)
Hospitalist [facility]  The patient's VQ scan revealed no perfusion defects  Plan: Will discontinue full-strength subcu Lovenox.  Buena Irish, MD

## 2022-11-25 NOTE — Progress Notes (Signed)
? ?  Inpatient Rehab Admissions Coordinator : ? ?Per therapy recommendations, patient was screened for CIR candidacy by Rodrigues Urbanek RN MSN.  At this time patient appears to be a potential candidate for CIR. I will place a rehab consult per protocol for full assessment. Please call me with any questions. ? ?Kruti Horacek RN MSN ?Admissions Coordinator ?[phone] ?  ?

## 2022-11-25 NOTE — Assessment & Plan Note (Signed)
Continue with venlafaxine, and alprazolam.

## 2022-11-25 NOTE — ED Notes (Signed)
VBG Results 0505 11/25/22 pH- 7.314 PCO2- 72.4 PO2- 109 BE,B- 8 HCO3- 36.8 TCO2- 39 sO2- 97 Na- 142 K- 4.6 iCa- 1.19 Hct- 36 Hgb- 12.2

## 2022-11-25 NOTE — Assessment & Plan Note (Addendum)
Echocardiogram with preserved LV systolic function with EF 55%, severe hypertrophy of the septal segment. RV systolic function is preserved. No significant valvular disease.   Sp ICD due to cardiac arrest.   Urine output not documented Systolic blood pressure 136 to 146 mmHg.  Plan to continue diuresis with IV furosemide 40 mg bid.  Continue with losartan and carvedilol.  Limited pharmacologic options due to low GFR.    V/Q scan low probability for pulmonary embolism.

## 2022-11-25 NOTE — Progress Notes (Incomplete)
PROGRESS NOTE    Marie Ramos  ZHY:[id number] DOB: August 29, 1955 DOA: 11/24/2022 PCP: Marie Floro, MD  67/F with history of cardiac arrest, anoxic brain injury, CKD, diastolic CHF, bipolar disorder and obesity presented to the ED after mechanical fall.  History of frequent falls over the last 6 months, last fall 3 days PTA, limited mobility since then, prior to admission furosemide dose was increased due to volume overload however continues to be weak and deconditioned. -Vital signs stable, sodium 138, BUN 46, creatinine 1.7, mag 2.9, troponin 47, hemoglobin 10.3, D-dimer 1.9 -Head CT negative for acute changes.  -Chest x-ray noted cardiomegaly pulmonary vascular congestion  -Admitted, started on IV Lasix    Subjective: -Feels better, breathing improving  Assessment and Plan:  Acute on chronic diastolic CHF Acute hypoxic respiratory failure -Echo with EF 55%, severe hypertrophy of the septal segment.  No outflow obstruction, RV systolic function is preserved.  -Sp ICD due to cardiac arrest.  -continue diuresis with IV furosemide 40 mg bid.  She is 1.5 L negative -Continue carvedilol, hold losartan, add jardiance -V/Q scan low probability for pulmonary embolism.  -Wean O2 as tolerated -PT OT following, CIR recommended  Acute kidney injury superimposed on chronic kidney disease (HCC) CKD stage 3b.  -hold ARB, continue lasix  Debility/freq falls -PT/OT> CIR eval -High-dose ox carbamazepine likely contributing to frequent falls, tremors and unsteady gait, will decrease dose  Anoxic brain damage (HCC) History of CVA with cognitive impairment. Continue aspirin and statin therapy.    Bipolar affective disorder in remission (HCC) Continue venlafaxine, oxcarbazepine, dose decreased  Class 3 obesity (HCC) Calculated BMI is 47.6    DVT prophylaxis: lovenox Code Status: Full code Family Communication: Spouse at bedside Disposition Plan: CIR likely 1 to 2  days  Consultants:    Procedures:   Antimicrobials:    Objective: Vitals:   11/26/22 0011 11/26/22 0019 11/26/22 0408 11/26/22 0712  BP: 127/65  139/77 (!) 142/73  Pulse: 65  71 70  Resp: 18  17 17   Temp:   97.8 F (36.6 C) 98.5 F (36.9 C)  TempSrc:   Oral Oral  SpO2: 92% 95% 93% 98%  Weight:   117.9 kg   Height:        Intake/Output Summary (Last 24 hours) at 11/26/2022 1237 Last data filed at 11/26/2022 1000 Gross per 24 hour  Intake 738 ml  Output 2200 ml  Net -1462 ml   Filed Weights   11/24/22 2010 11/25/22 0923 11/26/22 0408  Weight: 112.5 kg 118.2 kg 117.9 kg    Examination:  Obese pleasant female sitting up in bed, AAOx3 HEENT: No JVD CVS: S1-S2, regular rhythm Lungs: Decreased breath sounds to bases Abdomen: Soft, nontender, bowel sounds present Extremities: Trace edema   Data Reviewed:   CBC: Recent Labs  Lab 11/24/22 1207 11/24/22 1219 11/25/22 0135 11/25/22 0505  WBC 9.9  --   --   --   NEUTROABS 7.8*  --   --   --   HGB 10.3* 11.2*  11.2* 11.9* 12.2  HCT 35.0* 33.0*  33.0* 35.0* 36.0  MCV 106.7*  --   --   --   PLT 451*  --   --   --    Basic Metabolic Panel: Recent Labs  Lab 11/24/22 1207 11/24/22 1219 11/25/22 0126 11/25/22 0135 11/25/22 0505 11/26/22 0116  NA 138 138  139 144 143 142 137  K 4.9 4.9  4.9 4.3 4.4 4.6 4.1  CL 101  104 101  --   --  97*  CO2 28  --  33*  --   --  34*  GLUCOSE 72 74 117*  --   --  99  BUN 46* 49* 45*  --   --  46*  CREATININE 1.71* 1.70* 1.84*  --   --  1.67*  CALCIUM 8.9  --  9.2  --   --  8.9  MG 2.9*  --   --   --   --  2.4   GFR: Estimated Creatinine Clearance: 39.8 mL/min (A) (by C-G formula based on SCr of 1.67 mg/dL (H)). Liver Function Tests: Recent Labs  Lab 11/24/22 1207 11/25/22 0126  AST 23 50*  ALT 24 49*  ALKPHOS 95 107  BILITOT 0.5 0.3  PROT 6.4* 6.9  ALBUMIN 2.8* 2.9*   No results for input(s): "LIPASE", "AMYLASE" in the last 168 hours. No results for  input(s): "AMMONIA" in the last 168 hours. Coagulation Profile: No results for input(s): "INR", "PROTIME" in the last 168 hours. Cardiac Enzymes: No results for input(s): "CKTOTAL", "CKMB", "CKMBINDEX", "TROPONINI" in the last 168 hours. BNP (last 3 results) No results for input(s): "PROBNP" in the last 8760 hours. HbA1C: Recent Labs    11/25/22 0126  HGBA1C 6.2*   CBG: No results for input(s): "GLUCAP" in the last 168 hours. Lipid Profile: No results for input(s): "CHOL", "HDL", "LDLCALC", "TRIG", "CHOLHDL", "LDLDIRECT" in the last 72 hours. Thyroid Function Tests: No results for input(s): "TSH", "T4TOTAL", "FREET4", "T3FREE", "THYROIDAB" in the last 72 hours. Anemia Panel: Recent Labs    11/25/22 0126  VITAMINB12 685  FOLATE 27.3   Urine analysis:    Component Value Date/Time   COLORURINE YELLOW 11/24/2022 1945   APPEARANCEUR HAZY (A) 11/24/2022 1945   LABSPEC 1.014 11/24/2022 1945   PHURINE 5.0 11/24/2022 1945   GLUCOSEU NEGATIVE 11/24/2022 1945   HGBUR NEGATIVE 11/24/2022 1945   BILIRUBINUR NEGATIVE 11/24/2022 1945   KETONESUR NEGATIVE 11/24/2022 1945   PROTEINUR 100 (A) 11/24/2022 1945   NITRITE NEGATIVE 11/24/2022 1945   LEUKOCYTESUR NEGATIVE 11/24/2022 1945   Sepsis Labs: @LABRCNTIP (procalcitonin:4,lacticidven:4)  )No results found for this or any previous visit (from the past 240 hour(s)).   Radiology Studies: VAS [name] LOWER EXTREMITY VENOUS (DVT) (ONLY [facility] & [facility])  Result Date: 11/25/2022  Lower Venous DVT Study Patient Name:  Marie Ramos  Date of Exam:   11/24/2022 Medical Rec #: [id number]          Accession #:    [id number] Date of Birth: 26-Dec-1955          Patient Gender: F Patient Age:   80 years Exam Location:  [facility] Procedure:      VAS [name] LOWER EXTREMITY VENOUS (DVT) Referring Phys: Marie Ramos --------------------------------------------------------------------------------  Indications: Edema.  Risk Factors: None identified.  Limitations: Poor ultrasound/tissue interface. Comparison Study: No prior studies. Performing Technologist: Marie Ramos RVT  Examination Guidelines: A complete evaluation includes B-mode imaging, spectral Doppler, color Doppler, and power Doppler as needed of all accessible portions of each vessel. Bilateral testing is considered an integral part of a complete examination. Limited examinations for reoccurring indications may be performed as noted. The reflux portion of the exam is performed with the patient in reverse Trendelenburg.  +---------+---------------+---------+-----------+----------+--------------+ RIGHT    CompressibilityPhasicitySpontaneityPropertiesThrombus Aging +---------+---------------+---------+-----------+----------+--------------+ CFV      Full           Yes      Yes                                 +---------+---------------+---------+-----------+----------+--------------+  SFJ      Full                                                        +---------+---------------+---------+-----------+----------+--------------+ FV Prox  Full                                                        +---------+---------------+---------+-----------+----------+--------------+ FV Mid   Full                                                        +---------+---------------+---------+-----------+----------+--------------+ FV DistalFull                                                        +---------+---------------+---------+-----------+----------+--------------+ PFV      Full                                                        +---------+---------------+---------+-----------+----------+--------------+ POP      Full           Yes      Yes                                 +---------+---------------+---------+-----------+----------+--------------+ PTV      Full                                                         +---------+---------------+---------+-----------+----------+--------------+ PERO     Full                                                        +---------+---------------+---------+-----------+----------+--------------+   +---------+---------------+---------+-----------+----------+--------------+ LEFT     CompressibilityPhasicitySpontaneityPropertiesThrombus Aging +---------+---------------+---------+-----------+----------+--------------+ CFV      Full           Yes      Yes                                 +---------+---------------+---------+-----------+----------+--------------+ SFJ      Full                                                        +---------+---------------+---------+-----------+----------+--------------+  FV Prox  Full                                                        +---------+---------------+---------+-----------+----------+--------------+ FV Mid   Full                                                        +---------+---------------+---------+-----------+----------+--------------+ FV Distal               Yes      Yes                                 +---------+---------------+---------+-----------+----------+--------------+ PFV      Full                                                        +---------+---------------+---------+-----------+----------+--------------+ POP      Full           Yes      Yes                                 +---------+---------------+---------+-----------+----------+--------------+ PTV      Full                                                        +---------+---------------+---------+-----------+----------+--------------+ PERO     Full                                                        +---------+---------------+---------+-----------+----------+--------------+     Summary: RIGHT: - There is no evidence of deep vein thrombosis in the lower extremity.  - No cystic structure found in  the popliteal fossa.  LEFT: - There is no evidence of deep vein thrombosis in the lower extremity. However, portions of this examination were limited- see technologist comments above.  - No cystic structure found in the popliteal fossa.  *See table(s) above for measurements and observations. Electronically signed by Coral Else MD on 11/25/2022 at 7:17:17 PM.    Final    NM Pulmonary Perfusion  Result Date: 11/25/2022 CLINICAL DATA:  Shortness of breath EXAM: NUCLEAR MEDICINE PERFUSION LUNG SCAN TECHNIQUE: Perfusion images were obtained in multiple projections after intravenous injection of radiopharmaceutical. Ventilation scans intentionally deferred if perfusion scan and chest x-ray adequate for interpretation during COVID 19 epidemic. RADIOPHARMACEUTICALS:  4.6 mCi Tc-62m MAA IV COMPARISON:  X-ray 11/24/2022 FINDINGS: Focal defect along the left hemithorax consistent with patient's battery pack for defibrillator. Mediastinal shadows enlarged consistent with patient's enlarged heart. Otherwise only a few  small subsegmental peripheral defects identified. No larger defects seen on perfusion. IMPRESSION: Very low probability perfusion lung scan Electronically Signed   By: Karen Kays M.D.   On: 11/25/2022 12:43   CT HEAD WO CONTRAST  Result Date: 11/24/2022 CLINICAL DATA:  Syncope/presyncope EXAM: CT HEAD WITHOUT CONTRAST TECHNIQUE: Contiguous axial images were obtained from the base of the skull through the vertex without intravenous contrast. RADIATION DOSE REDUCTION: This exam was performed according to the departmental dose-optimization program which includes automated exposure control, adjustment of the mA and/or kV according to patient size and/or use of iterative reconstruction technique. COMPARISON:  08/16/2019 FINDINGS: Brain: No acute intracranial findings are seen. Ventricles are not dilated. There are no signs of bleeding. There is no focal mass effect. Cortical sulci are prominent. Vascular:  Coarse calcifications are seen arterial branches. Skull: No acute findings are seen. Sinuses/Orbits: There is mucosal thickening in the ethmoid sinus, especially in the posterior ethmoid sinus on the right side. Other: There is increased amount of fluid density in sella suggesting possible partial empty sella. IMPRESSION: No acute intracranial findings are seen in noncontrast CT brain. Electronically Signed   By: Ernie Avena M.D.   On: 11/24/2022 13:00     Scheduled Meds:  aspirin EC  81 mg Oral Daily   atorvastatin  20 mg Oral q1800   carvedilol  12.5 mg Oral BID WC   famotidine  20 mg Oral QHS   furosemide  60 mg Intravenous BID   nystatin cream  1 Application Topical BID   [START ON 11/27/2022] Oxcarbazepine  300 mg Oral Daily   venlafaxine XR  300 mg Oral QHS   venlafaxine XR  75 mg Oral Q breakfast   Continuous Infusions:   LOS: 2 days    Time spent:    Zannie Cove, MD [facility]   11/26/2022, 12:37 PM

## 2022-11-25 NOTE — Assessment & Plan Note (Signed)
CKD stage 3b.   Renal function with serum cr at 1,84 with K at 4,3 and serum bicarbonate at 33. Plan to continue furosemide IV and follow up renal function in am.

## 2022-11-25 NOTE — Progress Notes (Signed)
[facility] night cross cover note:   I was notified by RN that this patient, who was admitted earlier in the evening for acute on chronic diastolic heart failure, undergoing IV diuresis, is maintaining oxygen saturations in the high 80s on existing CPAP with 8 L of bleed in oxygen.  RN conveys that he has discussed case with respiratory therapy, who conveys that settings on CPAP have been optimized, and recommends consideration for transitioning to BiPAP.  I subsequently placed order for initiation of BiPAP, as above, and added a VBG to be checked at 8 AM this morning.  Of note, remainder of the patient's vital signs appear stable leading up to initiation of BiPAP, including afebrile, most recent blood pressure 147/75, heart rates in the 80s.     Newton Pigg, DO Hospitalist.[facility]

## 2022-11-25 NOTE — Progress Notes (Signed)
Patient taken off Bipap to rest and placed on [place] 4L. Patient states her breathing feels comfortable. Patient has no increased RR or WOB at this time. RN notified.

## 2022-11-25 NOTE — Significant Event (Addendum)
Cross Cover  The patient's O2 sats dropped when she went to sleep.  She is not in distress and wakes up easily but her O2 sats were in the low 80s. Even when her oxygen was turned up to 6 L her sats only went up to about 90.  Given her body habitus we will try CPAP with  sleeping which will increase her level of care.   Buena Irish, MD

## 2022-11-25 NOTE — Progress Notes (Signed)
Progress Note  Patient elevated to Bipap last night.  Her mild CHF does not explain that level of hypoxia.  Her VQ scan is still pending.  Will go ahead with full strength Lovenox until it results.   Buena Irish, MD

## 2022-11-25 NOTE — ED Notes (Signed)
.ED TO INPATIENT HANDOFF REPORT  ED Nurse Name and Phone #: [phone]  S Name/Age/Gender Norris Cross 67 y.o. female Room/Bed: 007C/007C  Code Status   Code Status: Full Code  Home/SNF/Other Home Patient oriented to: self, place, time, and situation Is this baseline? Yes   Triage Complete: Triage complete  Chief Complaint Acute exacerbation of CHF (congestive heart failure) (HCC) [I50.9]  Triage Note No notes on file   Allergies Allergies  Allergen Reactions   Sulfa Antibiotics Itching    Face neck red   Albuterol Itching   Other Hives   Sulfamethoxazole-Trimethoprim Hives   Vicodin Hp [Hydrocodone-Acetaminophen] Hives   Naproxen Itching   Vicodin [Hydrocodone-Acetaminophen] Itching    Level of Care/Admitting Diagnosis ED Disposition     ED Disposition  Admit   Condition  --   Comment  Hospital Area: [facility] [[id number]]  Level of Care: Telemetry Cardiac [[id number]]  May admit patient to [facility] or [facility] if equivalent level of care is available:: No  Covid Evaluation: Asymptomatic - no recent exposure (last 10 days) testing not required  Diagnosis: Acute exacerbation of CHF (congestive heart failure) [facility]) [[id number]]  Admitting Physician: Buena Irish [[id number]]  Attending Physician: Buena Irish [[id number]]  Certification:: I certify this patient will need inpatient services for at least 2 midnights  Estimated Length of Stay: 5          B Medical/Surgery History Past Medical History:  Diagnosis Date   Abnormality of gait    Acute bilat watershed infarction [facility])    Acute lower UTI    Acute metabolic encephalopathy 08/16/2019   Acute respiratory failure (HCC)    Acute systolic congestive heart failure (HCC)    AKI (acute kidney injury) (HCC)    Anoxic brain damage (HCC)    Bipolar 1 disorder (HCC)    Bipolar affective disorder in remission (HCC)    Cardiac arrest (HCC) 08/24/2015   Cerebral infarction due to embolism of  cerebral artery (HCC)    Cerebrovascular accident (CVA) due to embolism of cerebral artery (HCC) 12/05/2015   Chronically dry eyes    Closed fracture of left distal radius 12/01/2018   DVT (deep venous thrombosis) (HCC) 12/05/2015   Dyspnea    r/t seasonal allergies   Hypertrophic obstructive cardiomyopathy (HCC) 08/29/2015   Leukocytosis    Macular degeneration    Pneumonia due to COVID-19 virus 08/16/2019   Pulmonary vascular congestion 08/16/2019   QT prolongation 08/29/2015   Stroke (HCC)    Systolic dysfunction with acute on chronic heart failure (HCC)    TBI (traumatic brain injury) (HCC) 2011   Inpatient rehab [facility]   Thrombocytosis    Ventricular fibrillation (HCC) 09/03/15   MDT ICD Dr. Johney Frame   Past Surgical History:  Procedure Laterality Date   ABDOMINAL HYSTERECTOMY     CARDIAC CATHETERIZATION N/A 08/24/2015   Procedure: Left Heart Cath and Coronary Angiography;  Surgeon: Tonny Bollman, MD;  Location: [facility] INVASIVE CV LAB;  Service: Cardiovascular;  Laterality: N/A;   EP IMPLANTABLE DEVICE N/A 09/03/2015   MDT dual chamber ICD   OPEN REDUCTION INTERNAL FIXATION (ORIF) DISTAL RADIAL FRACTURE Left 12/01/2018   Procedure: OPEN REDUCTION INTERNAL FIXATION (ORIF) LEFT DISTAL RADIAL FRACTURE;  Surgeon: Bjorn Pippin, MD;  Location: [facility] ORS;  Service: Orthopedics;  Laterality: Left;   SPLENECTOMY, TOTAL  2011   TONSILLECTOMY       A IV Location/Drains/Wounds Patient Lines/Drains/Airways Status     Active Line/Drains/Airways  Name Placement date Placement time Site Days   Peripheral IV 11/24/22 20 G Anterior;Left;Proximal Forearm 11/24/22  1135  Forearm  1   External Urinary Catheter 11/25/22  0547  --  less than 1   Incision (Closed) 12/01/18 Arm Left 12/01/18  1424  -- 1455            Intake/Output Last 24 hours No intake or output data in the 24 hours ending 11/25/22 0839  Labs/Imaging Results for orders placed or performed during the hospital encounter of 11/24/22  (from the past 48 hour(s))  CBC WITH DIFFERENTIAL     Status: Abnormal   Collection Time: 11/24/22 12:07 PM  Result Value Ref Range   WBC 9.9 4.0 - 10.5 K/uL   RBC 3.28 (L) 3.87 - 5.11 MIL/uL   Hemoglobin 10.3 (L) 12.0 - 15.0 g/dL   HCT 60.4 (L) 54.0 - 98.1 %   MCV 106.7 (H) 80.0 - 100.0 fL   MCH 31.4 26.0 - 34.0 pg   MCHC 29.4 (L) 30.0 - 36.0 g/dL   RDW 19.1 47.8 - 29.5 %   Platelets 451 (H) 150 - 400 K/uL   nRBC 0.4 (H) 0.0 - 0.2 %   Neutrophils Relative % 80 %   Neutro Abs 7.8 (H) 1.7 - 7.7 K/uL   Lymphocytes Relative 9 %   Lymphs Abs 0.9 0.7 - 4.0 K/uL   Monocytes Relative 9 %   Monocytes Absolute 0.9 0.1 - 1.0 K/uL   Eosinophils Relative 2 %   Eosinophils Absolute 0.2 0.0 - 0.5 K/uL   Basophils Relative 0 %   Basophils Absolute 0.0 0.0 - 0.1 K/uL   Immature Granulocytes 0 %   Abs Immature Granulocytes 0.04 0.00 - 0.07 K/uL    Comment: Performed at [facility], [address], [place], [place] [address]  Comprehensive metabolic panel     Status: Abnormal   Collection Time: 11/24/22 12:07 PM  Result Value Ref Range   Sodium 138 135 - 145 mmol/L   Potassium 4.9 3.5 - 5.1 mmol/L   Chloride 101 98 - 111 mmol/L   CO2 28 22 - 32 mmol/L   Glucose, Bld 72 70 - 99 mg/dL    Comment: Glucose reference range applies only to samples taken after fasting for at least 8 hours.   BUN 46 (H) 8 - 23 mg/dL   Creatinine, Ser 8.65 (H) 0.44 - 1.00 mg/dL   Calcium 8.9 8.9 - 78.4 mg/dL   Total Protein 6.4 (L) 6.5 - 8.1 g/dL   Albumin 2.8 (L) 3.5 - 5.0 g/dL   AST 23 15 - 41 U/L   ALT 24 0 - 44 U/L   Alkaline Phosphatase 95 38 - 126 U/L   Total Bilirubin 0.5 0.3 - 1.2 mg/dL   GFR, Estimated 32 (L) >60 mL/min    Comment: (NOTE) Calculated using the CKD-EPI Creatinine Equation (2021)    Anion gap 9 5 - 15    Comment: Performed at [facility], [address], [place], [place] [address]  Magnesium     Status: Abnormal   Collection Time: 11/24/22 12:07 PM  Result Value Ref  Range   Magnesium 2.9 (H) 1.7 - 2.4 mg/dL    Comment: Performed at [facility], [address] [place], [place] [address]  Troponin I (High Sensitivity)     Status: None   Collection Time: 11/24/22 12:07 PM  Result Value Ref Range   Troponin I (High Sensitivity)  16 <18 ng/L    Comment: (NOTE) Elevated high sensitivity troponin I (hsTnI) values and significant  changes across serial measurements may suggest ACS but many other  chronic and acute conditions are known to elevate hsTnI results.  Refer to the "Links" section for chest pain algorithms and additional  guidance. Performed at [facility], [address] [place], [place] [address]   Troponin I (High Sensitivity)     Status: Abnormal   Collection Time: 11/24/22 12:07 PM  Result Value Ref Range   Troponin I (High Sensitivity) 47 (H) <18 ng/L    Comment: RESULT CALLED TO, READ BACK BY AND VERIFIED WITH BLANCHARD,P,RN 11/24/22 1455 SATRAIN,R (NOTE) Elevated high sensitivity troponin I (hsTnI) values and significant  changes across serial measurements may suggest ACS but many other  chronic and acute conditions are known to elevate hsTnI results.  Refer to the Links section for chest pain algorithms and additional  guidance. Performed at [facility], [address], [place], [place] [address] CORRECTED ON 05/20 AT 1500: PREVIOUSLY REPORTED AS 47 RESULT CALLED TO, READ BACK BY AND VERIFIED WITH   I-Stat Chem 8, ED     Status: Abnormal   Collection Time: 11/24/22 12:19 PM  Result Value Ref Range   Sodium 139 135 - 145 mmol/L   Potassium 4.9 3.5 - 5.1 mmol/L   Chloride 104 98 - 111 mmol/L   BUN 49 (H) 8 - 23 mg/dL   Creatinine, Ser 0.98 (H) 0.44 - 1.00 mg/dL   Glucose, Bld 74 70 - 99 mg/dL    Comment: Glucose reference range applies only to samples taken after fasting for at least 8 hours.   Calcium, Ion 1.13 (L) 1.15 - 1.40 mmol/L   TCO2 34 (H) 22 - 32 mmol/L   Hemoglobin 11.2 (L) 12.0 - 15.0 g/dL    HCT 11.9 (L) 14.7 - 46.0 %  I-Stat venous blood gas, ([facility] ED, [facility], [facility])     Status: Abnormal   Collection Time: 11/24/22 12:19 PM  Result Value Ref Range   pH, Ven 7.413 7.25 - 7.43   pCO2, Ven 50.9 44 - 60 mmHg   pO2, Ven 181 (H) 32 - 45 mmHg   Bicarbonate 32.5 (H) 20.0 - 28.0 mmol/L   TCO2 34 (H) 22 - 32 mmol/L   O2 Saturation 100 %   Acid-Base Excess 7.0 (H) 0.0 - 2.0 mmol/L   Sodium 138 135 - 145 mmol/L   Potassium 4.9 3.5 - 5.1 mmol/L   Calcium, Ion 1.13 (L) 1.15 - 1.40 mmol/L   HCT 33.0 (L) 36.0 - 46.0 %   Hemoglobin 11.2 (L) 12.0 - 15.0 g/dL   Sample type VENOUS   D-dimer, quantitative     Status: Abnormal   Collection Time: 11/24/22  5:31 PM  Result Value Ref Range   D-Dimer, Quant 1.99 (H) 0.00 - 0.50 ug/mL-FEU    Comment: (NOTE) At the manufacturer cut-off value of 0.5 g/mL FEU, this assay has a negative predictive value of 95-100%.This assay is intended for use in conjunction with a clinical pretest probability (PTP) assessment model to exclude pulmonary embolism (PE) and deep venous thrombosis (DVT) in outpatients suspected of PE or DVT. Results should be correlated with clinical presentation. Performed at [facility], [address] [place], [place] [address]   Troponin I (High Sensitivity)     Status: None   Collection Time: 11/24/22  5:31 PM  Result Value Ref Range   Troponin  I (High Sensitivity) 13 <18 ng/L    Comment: DELTA CHECK NOTED (NOTE) Elevated high sensitivity troponin I (hsTnI) values and significant  changes across serial measurements may suggest ACS but many other  chronic and acute conditions are known to elevate hsTnI results.  Refer to the "Links" section for chest pain algorithms and additional  guidance. Performed at [facility], [address] [place], [place] [address]   Urinalysis, Routine w reflex microscopic -Urine, Clean Catch     Status: Abnormal   Collection Time: 11/24/22  7:45 PM  Result Value Ref Range    Color, Urine YELLOW YELLOW   APPearance HAZY (A) CLEAR   Specific Gravity, Urine 1.014 1.005 - 1.030   pH 5.0 5.0 - 8.0   Glucose, UA NEGATIVE NEGATIVE mg/dL   Hgb urine dipstick NEGATIVE NEGATIVE   Bilirubin Urine NEGATIVE NEGATIVE   Ketones, ur NEGATIVE NEGATIVE mg/dL   Protein, ur 409 (A) NEGATIVE mg/dL   Nitrite NEGATIVE NEGATIVE   Leukocytes,Ua NEGATIVE NEGATIVE   RBC / HPF 0-5 0 - 5 RBC/hpf   WBC, UA 0-5 0 - 5 WBC/hpf   Bacteria, UA RARE (A) NONE SEEN   Squamous Epithelial / HPF 6-10 0 - 5 /HPF   Mucus PRESENT    Hyaline Casts, UA PRESENT     Comment: Performed at [facility], [address], [place], [place] [address]  Troponin I (High Sensitivity)     Status: None   Collection Time: 11/24/22  7:59 PM  Result Value Ref Range   Troponin I (High Sensitivity) 16 <18 ng/L    Comment: (NOTE) Elevated high sensitivity troponin I (hsTnI) values and significant  changes across serial measurements may suggest ACS but many other  chronic and acute conditions are known to elevate hsTnI results.  Refer to the "Links" section for chest pain algorithms and additional  guidance. Performed at [facility], [address], [place], [place] [address]   Brain natriuretic peptide     Status: Abnormal   Collection Time: 11/24/22  7:59 PM  Result Value Ref Range   B Natriuretic Peptide 120.2 (H) 0.0 - 100.0 pg/mL    Comment: Performed at [facility], [address], [place], [place] [address]  HIV Antibody (routine testing w rflx)     Status: None   Collection Time: 11/25/22  1:26 AM  Result Value Ref Range   HIV Screen 4th Generation wRfx Non Reactive Non Reactive    Comment: Performed at [facility], [address], [place], [place] [address]  Comprehensive metabolic panel     Status: Abnormal   Collection Time: 11/25/22  1:26 AM  Result Value Ref Range   Sodium 144 135 - 145 mmol/L   Potassium 4.3 3.5 - 5.1 mmol/L   Chloride 101 98 - 111 mmol/L   CO2 33  (H) 22 - 32 mmol/L   Glucose, Bld 117 (H) 70 - 99 mg/dL    Comment: Glucose reference range applies only to samples taken after fasting for at least 8 hours.   BUN 45 (H) 8 - 23 mg/dL   Creatinine, Ser 8.46 (H) 0.44 - 1.00 mg/dL   Calcium 9.2 8.9 - 96.2 mg/dL   Total Protein 6.9 6.5 - 8.1 g/dL   Albumin 2.9 (L) 3.5 - 5.0 g/dL   AST 50 (H) 15 - 41 U/L   ALT 49 (H) 0 - 44 U/L   Alkaline Phosphatase 107 38 -  126 U/L   Total Bilirubin 0.3 0.3 - 1.2 mg/dL   GFR, Estimated 30 (L) >60 mL/min    Comment: (NOTE) Calculated using the CKD-EPI Creatinine Equation (2021)    Anion gap 10 5 - 15    Comment: Performed at [facility], [address], [place], [place] [address]  Hemoglobin A1c     Status: Abnormal   Collection Time: 11/25/22  1:26 AM  Result Value Ref Range   Hgb A1c MFr Bld 6.2 (H) 4.8 - 5.6 %    Comment: (NOTE) Pre diabetes:          5.7%-6.4%  Diabetes:              >6.4%  Glycemic control for   <7.0% adults with diabetes    Mean Plasma Glucose 131.24 mg/dL    Comment: Performed at [facility], [address], [place], [place] [address]   VAS [name] LOWER EXTREMITY VENOUS (DVT) (ONLY [facility])  Result Date: 11/24/2022  Lower Venous DVT Study Patient Name:  JOURNEII SHOLAR  Date of Exam:   11/24/2022 Medical Rec #: [id number]          Accession #:    [id number] Date of Birth: 1956/04/10          Patient Gender: F Patient Age:   67 years Exam Location:  [facility] Procedure:      VAS [name] LOWER EXTREMITY VENOUS (DVT) Referring Phys: MATTHEW TRIFAN --------------------------------------------------------------------------------  Indications: Edema.  Risk Factors: None identified. Limitations: Poor ultrasound/tissue interface. Comparison Study: No prior studies. Performing Technologist: Chanda Busing RVT  Examination Guidelines: A complete evaluation includes B-mode imaging, spectral Doppler, color Doppler, and power Doppler as needed of all accessible portions of  each vessel. Bilateral testing is considered an integral part of a complete examination. Limited examinations for reoccurring indications may be performed as noted. The reflux portion of the exam is performed with the patient in reverse Trendelenburg.  +---------+---------------+---------+-----------+----------+--------------+ RIGHT    CompressibilityPhasicitySpontaneityPropertiesThrombus Aging +---------+---------------+---------+-----------+----------+--------------+ CFV      Full           Yes      Yes                                 +---------+---------------+---------+-----------+----------+--------------+ SFJ      Full                                                        +---------+---------------+---------+-----------+----------+--------------+ FV Prox  Full                                                        +---------+---------------+---------+-----------+----------+--------------+ FV Mid   Full                                                        +---------+---------------+---------+-----------+----------+--------------+ FV DistalFull                                                        +---------+---------------+---------+-----------+----------+--------------+  PFV      Full                                                        +---------+---------------+---------+-----------+----------+--------------+ POP      Full           Yes      Yes                                 +---------+---------------+---------+-----------+----------+--------------+ PTV      Full                                                        +---------+---------------+---------+-----------+----------+--------------+ PERO     Full                                                        +---------+---------------+---------+-----------+----------+--------------+   +---------+---------------+---------+-----------+----------+--------------+ LEFT      CompressibilityPhasicitySpontaneityPropertiesThrombus Aging +---------+---------------+---------+-----------+----------+--------------+ CFV      Full           Yes      Yes                                 +---------+---------------+---------+-----------+----------+--------------+ SFJ      Full                                                        +---------+---------------+---------+-----------+----------+--------------+ FV Prox  Full                                                        +---------+---------------+---------+-----------+----------+--------------+ FV Mid   Full                                                        +---------+---------------+---------+-----------+----------+--------------+ FV Distal               Yes      Yes                                 +---------+---------------+---------+-----------+----------+--------------+ PFV      Full                                                        +---------+---------------+---------+-----------+----------+--------------+  POP      Full           Yes      Yes                                 +---------+---------------+---------+-----------+----------+--------------+ PTV      Full                                                        +---------+---------------+---------+-----------+----------+--------------+ PERO     Full                                                        +---------+---------------+---------+-----------+----------+--------------+    Summary: RIGHT: - There is no evidence of deep vein thrombosis in the lower extremity.  - No cystic structure found in the popliteal fossa.  LEFT: - There is no evidence of deep vein thrombosis in the lower extremity. However, portions of this examination were limited- see technologist comments above.  - No cystic structure found in the popliteal fossa.  *See table(s) above for measurements and observations.    Preliminary    CT  HEAD WO CONTRAST  Result Date: 11/24/2022 CLINICAL DATA:  Syncope/presyncope EXAM: CT HEAD WITHOUT CONTRAST TECHNIQUE: Contiguous axial images were obtained from the base of the skull through the vertex without intravenous contrast. RADIATION DOSE REDUCTION: This exam was performed according to the departmental dose-optimization program which includes automated exposure control, adjustment of the mA and/or kV according to patient size and/or use of iterative reconstruction technique. COMPARISON:  08/16/2019 FINDINGS: Brain: No acute intracranial findings are seen. Ventricles are not dilated. There are no signs of bleeding. There is no focal mass effect. Cortical sulci are prominent. Vascular: Coarse calcifications are seen arterial branches. Skull: No acute findings are seen. Sinuses/Orbits: There is mucosal thickening in the ethmoid sinus, especially in the posterior ethmoid sinus on the right side. Other: There is increased amount of fluid density in sella suggesting possible partial empty sella. IMPRESSION: No acute intracranial findings are seen in noncontrast CT brain. Electronically Signed   By: Ernie Avena M.D.   On: 11/24/2022 13:00   DG Knee 2 Views Right  Result Date: 11/24/2022 CLINICAL DATA:  Fall.  Bilateral anterior knee abrasions. EXAM: RIGHT KNEE - 1-2 VIEW COMPARISON:  None Available. FINDINGS: There is diffuse decreased bone mineralization. Moderate to severe medial compartment joint space narrowing and mild-to-moderate peripheral osteophytosis. Mild peripheral lateral compartment degenerative osteophytosis without joint space narrowing. Moderate patellofemoral joint space narrowing and peripheral osteophytosis. A 5 mm well corticated chronic ossicle is seen inferior to the patella, overlying Hoffa's fat pad on lateral view. Small joint effusion. No acute fracture or dislocation. IMPRESSION: 1. Moderate-to-severe medial and moderate patellofemoral compartment osteoarthritis. 2. Small  joint effusion. Electronically Signed   By: Neita Garnet M.D.   On: 11/24/2022 12:37   DG Knee 2 Views Left  Result Date: 11/24/2022 CLINICAL DATA:  Fall.  Bilateral knee anterior abrasions. EXAM: LEFT KNEE - 1-2 VIEW COMPARISON:  None Available. FINDINGS: There is diffuse decreased bone mineralization. Severe medial compartment joint space narrowing  with bone-on-bone contact, subchondral sclerosis, and moderate peripheral osteophytosis. Mild peripheral glenoid degenerative osteophytosis without joint space narrowing. Severe patellofemoral joint space narrowing and peripheral osteophytosis. Well-corticated chronic 15 mm loose body overlying the distal anterior femoral metaphyseal cortex and the suprapatellar joint space. Small joint effusion. Mild-to-moderate chronic enthesopathic change at the quadriceps insertion on the patella. No acute fracture or dislocation. IMPRESSION: 1. Severe medial and patellofemoral compartment osteoarthritis. 2. Small joint effusion. 3. Chronic 15 mm loose body within the suprapatellar joint space. Electronically Signed   By: Neita Garnet M.D.   On: 11/24/2022 12:36   DG Chest Port 1 View  Result Date: 11/24/2022 CLINICAL DATA:  Fall.  Shortness of breath. EXAM: PORTABLE CHEST 1 VIEW COMPARISON:  Chest radiographs 08/16/2019 and 09/04/2015 FINDINGS: Cardiac silhouette is moderately to markedly enlarged, stable to increased from 08/16/2019 most recent prior. Mediastinal contours are within normal limits. Calcification is again seen within the aortic arch. Left chest wall cardiac AICD with leads again overlying the right atrium and right ventricle. Mild-to-moderate bilateral interstitial thickening is mildly improved from 08/16/2019 and similar to 09/04/2015. No definite pleural effusion. No pneumothorax. Old healed bilateral rib fractures are grossly similar to prior. IMPRESSION: 1. Mild-to-moderate bilateral interstitial thickening, mildly improved from 08/16/2019 and similar  to 09/04/2015. Findings may represent interstitial scarring. Improved, mild interstitial pulmonary edema is also possible. 2. Moderate to marked cardiomegaly, stable to increased from 08/16/2019. Electronically Signed   By: Neita Garnet M.D.   On: 11/24/2022 12:34    Pending Labs Unresulted Labs (From admission, onward)     Start     Ordered   11/25/22 0800  Blood gas, venous  Once-Timed,   TIMED        11/25/22 0448   11/24/22 2127  Resp panel by RT-PCR (RSV, Flu A&B, Covid) Anterior Nasal Swab  ONCE - URGENT,   URGENT        11/24/22 2127            Vitals/Pain Today's Vitals   11/25/22 0500 11/25/22 0600 11/25/22 0754 11/25/22 0807  BP: (!) 161/76 (!) 144/67  (!) 142/100  Pulse: 81 80  82  Resp: 18 18  18   Temp:   98.2 F (36.8 C)   TempSrc:   Axillary   SpO2: 99% 98%  100%  Weight:      Height:        Isolation Precautions No active isolations  Medications Medications  acetaminophen (TYLENOL) tablet 500 mg (has no administration in time range)    Or  acetaminophen (TYLENOL) suppository 650 mg (has no administration in time range)  bisacodyl (DULCOLAX) EC tablet 5 mg (has no administration in time range)  aspirin EC tablet 81 mg (has no administration in time range)  amLODipine (NORVASC) tablet 5 mg (has no administration in time range)  atorvastatin (LIPITOR) tablet 20 mg (has no administration in time range)  carvedilol (COREG) tablet 12.5 mg (has no administration in time range)  losartan (COZAAR) tablet 100 mg (has no administration in time range)  ALPRAZolam (XANAX) tablet 0.5 mg (0.5 mg Oral Given 11/25/22 0319)  venlafaxine XR (EFFEXOR-XR) 24 hr capsule 75 mg (has no administration in time range)  venlafaxine XR (EFFEXOR-XR) 24 hr capsule 300 mg (300 mg Oral Given 11/24/22 2236)  famotidine (PEPCID) tablet 20 mg (has no administration in time range)  Oxcarbazepine (TRILEPTAL) tablet 600 mg (has no administration in time range)  nystatin cream (MYCOSTATIN) 1  Application (1 Application Topical Given 11/25/22 0136)  furosemide (LASIX) injection 20 mg (has no administration in time range)  furosemide (LASIX) injection 40 mg (40 mg Intravenous Given 11/24/22 1944)    Mobility walks with person assist     Focused Assessments Neuro Assessment Handoff:  Swallow screen pass? Yes          Neuro Assessment:   Neuro Checks:      Has TPA been given? No If patient is a Neuro Trauma and patient is going to OR before floor call report to 4N Charge nurse: [phone] or [phone]  , Pulmonary Assessment Handoff:  Lung sounds: Bilateral Breath Sounds: Diminished, Clear O2 Device: Nasal Cannula O2 Flow Rate (L/min): 4 L/min    R Recommendations: See Admitting Provider Note  Report given to:   Additional Notes:   Pt off BIPAP now O2 sats 98% 4L [place], admitted for hypoxia going to [place] lung scan today.

## 2022-11-25 NOTE — ED Notes (Signed)
Patient's O2 increased to 6 lpm per provider request. Also placed patient onto ETCO2 monitoring, which is reading in the 70s.

## 2022-11-25 NOTE — Hospital Course (Addendum)
Marie Ramos was admitted to the hospital with the working diagnosis of heart failure exacerbation.   67 yo female with the past medical history of cardiac arrest, anoxic brain injury due to cardiac arrest, CKD, heart failure, bipolar and obesity who presented with a mechanical fall. Patient had frequent falls at home about 6 time over lat 6 months. Last fall 3 days prior to admission, since then she has been not able to ambulate. As outpatient her furosemide dose was increased due to volume overload. Patient continue to be very weak and deconditioned. Because of persistent symptoms she was brought to the ED.  On her initial physical examination in the ED her 02 saturation was in the 80's, and increased to 92% on 2 L/min per [place], blood pressure 150/76, HR 70, RR 15 and 02 saturation 94%, lungs with no wheezing or rales, heart with S1 and S2 present and rhythmic, abdomen with no distention and no lower extremity edema.   Na 138, K 4,9 Cl 101, bicarbonate 28, glucose 72, bun 46 cr 1,70 Mg 2.9  High sensitive troponin 47 and 13  Wbc 9,9 hgb 10.3 plt 451  D dimer 1,99   Urine analysis SG 1,014, protein 100, negative leukocytes and negative hgb.   Head CT negative for acute changes.  Chest radiograph with cardiomegaly, bilateral hilar vascular congestion with no effusions, pacemaker defibrillator in place with one right atrial and one right ventricular lead.   EKG 63 bpm, normal axis, normal intervals, sinus rhythm with no significant ST segment or T wave changes.   Patient has been placed on IV furosemide for diuresis.

## 2022-11-25 NOTE — Assessment & Plan Note (Addendum)
History of CVA with cognitive impairment. Continue blood pressure control, aspirin and statin therapy.

## 2022-11-25 NOTE — Plan of Care (Signed)
  Problem: Clinical Measurements: Goal: Respiratory complications will improve Outcome: Progressing   Problem: Activity: Goal: Risk for activity intolerance will decrease Outcome: Progressing   Problem: Safety: Goal: Ability to remain free from injury will improve Outcome: Progressing   

## 2022-11-25 NOTE — Assessment & Plan Note (Signed)
Calculated BMI is 47.6

## 2022-11-25 NOTE — ED Notes (Signed)
VBG Results 0135 11/25/22 pH-7.311 PCO2- 72.4 PO2- 44 BE,B- 8 HCO3- 36.6 TCO2-39 sO2- 73 Na-143 K-4.4  iCa-1.23 Hct- 35 Hgb- 11.9

## 2022-11-25 NOTE — Progress Notes (Signed)
Progress Note   Patient: [name] ZOX:[id number] DOB: May 31, 1956 DOA: 11/24/2022     1 DOS: the patient was seen and examined on 11/25/2022   Brief hospital course: Mrs. Brief was admitted to the hospital with the working diagnosis of heart failure exacerbation.   67 yo female with the past medical history of cardiac arrest, anoxic brain injury due to cardiac arrest, CKD, heart failure, bipolar and obesity who presented with a mechanical fall. Patient had frequent falls at home about 6 time over lat 6 months. Last fall 3 days prior to admission, since then she has been not able to ambulate. As outpatient her furosemide dose was increased due to volume overload. Patient continue to be very weak and deconditioned. Because of persistent symptoms she was brought to the ED.  On her initial physical examination in the ED her 02 saturation was in the 80's, and increased to 92% on 2 L/min per [place], blood pressure 150/76, HR 70, RR 15 and 02 saturation 94%, lungs with no wheezing or rales, heart with S1 and S2 present and rhythmic, abdomen with no distention and no lower extremity edema.   Na 138, K 4,9 Cl 101, bicarbonate 28, glucose 72, bun 46 cr 1,70 Mg 2.9  High sensitive troponin 47 and 13  Wbc 9,9 hgb 10.3 plt 451  D dimer 1,99   Urine analysis SG 1,014, protein 100, negative leukocytes and negative hgb.   Head CT negative for acute changes.  Chest radiograph with cardiomegaly, bilateral hilar vascular congestion with no effusions, pacemaker defibrillator in place with one right atrial and one right ventricular lead.   EKG 63 bpm, normal axis, normal intervals, sinus rhythm with no significant ST segment or T wave changes.   Patient has been placed on IV furosemide for diuresis.    Assessment and Plan: * Acute on chronic diastolic CHF (congestive heart failure) (HCC) Echocardiogram with preserved LV systolic function with EF 55%, severe hypertrophy of the septal segment. RV systolic  function is preserved. No significant valvular disease.   Sp ICD due to cardiac arrest.   Urine output not documented Systolic blood pressure 136 to 146 mmHg.  Plan to continue diuresis with IV furosemide 40 mg bid.  Continue with losartan and carvedilol.  Limited pharmacologic options due to low GFR.    Anoxic brain damage (HCC) History of CVA with cognitive impairment. Continue blood pressure control, aspirin and statin therapy.    Bipolar affective disorder in remission (HCC) Continue with venlafaxine, and alprazolam.   Class 3 obesity (HCC) Calculated BMI is 47.6         Subjective: patient with improvement in dyspnea but not back to baseline.   Physical Exam: Vitals:   11/25/22 0754 11/25/22 0807 11/25/22 0923 11/25/22 1110  BP:  (!) 142/100 (!) 151/97 138/68  Pulse:  82 83 80  Resp:  18 20 (!) 22  Temp: 98.2 F (36.8 C)  98 F (36.7 C) 98.3 F (36.8 C)  TempSrc: Axillary  Oral   SpO2:  100% 99% 95%  Weight:   118.2 kg   Height:   5\' 2"  (1.575 m)    Neurology awake and alert ENT with mild pallor Cardiovascular with S1 and S2 present and regular with no gallops, rubs or murmurs Mild JVD Trace lower extremity edema Respiratory with rales at bases bilaterally, with no wheezing or rhonchi Abdomen with no distention  Data Reviewed:    Family Communication: I spoke with patient's daughter at the bedside,  we talked in detail about patient's condition, plan of care and prognosis and all questions were addressed.   Disposition: Status is: Inpatient Remains inpatient appropriate because: IV diuresis.   Planned Discharge Destination: Home     Author: Coralie Keens, MD 11/25/2022 2:23 PM  For on call review [email or web address].

## 2022-11-25 NOTE — Progress Notes (Signed)
ANTICOAGULATION CONSULT NOTE - Initial Consult  Pharmacy Consult for Lovenox Indication:  R/o PE  Allergies  Allergen Reactions   Sulfa Antibiotics Itching    Face neck red   Albuterol Itching   Other Hives   Sulfamethoxazole-Trimethoprim Hives   Vicodin Hp [Hydrocodone-Acetaminophen] Hives   Naproxen Itching   Vicodin [Hydrocodone-Acetaminophen] Itching    Patient Measurements: Height: 5\' 2"  (157.5 cm) Weight: 118.2 kg (260 lb 9.3 oz) IBW/kg (Calculated) : 50.1 Heparin Dosing Weight:   Vital Signs: Temp: 98 F (36.7 C) (05/21 0923) Temp Source: Oral (05/21 0923) BP: 151/97 (05/21 0923) Pulse Rate: 83 (05/21 0923)  Labs: Recent Labs    11/24/22 1207 11/24/22 1219 11/24/22 1731 11/24/22 1959 11/25/22 0126 11/25/22 0135 11/25/22 0505  HGB 10.3* 11.2*  11.2*  --   --   --  11.9* 12.2  HCT 35.0* 33.0*  33.0*  --   --   --  35.0* 36.0  PLT 451*  --   --   --   --   --   --   CREATININE 1.71* 1.70*  --   --  1.84*  --   --   TROPONINIHS 47*  16  --  13 16  --   --   --     Estimated Creatinine Clearance: 36.2 mL/min (A) (by C-G formula based on SCr of 1.84 mg/dL (H)).   Medical History: Past Medical History:  Diagnosis Date   Abnormality of gait    Acute bilat watershed infarction [facility])    Acute lower UTI    Acute metabolic encephalopathy 08/16/2019   Acute respiratory failure (HCC)    Acute systolic congestive heart failure (HCC)    AKI (acute kidney injury) (HCC)    Anoxic brain damage (HCC)    Bipolar 1 disorder (HCC)    Bipolar affective disorder in remission (HCC)    Cardiac arrest (HCC) 08/24/2015   Cerebral infarction due to embolism of cerebral artery (HCC)    Cerebrovascular accident (CVA) due to embolism of cerebral artery (HCC) 12/05/2015   Chronically dry eyes    Closed fracture of left distal radius 12/01/2018   DVT (deep venous thrombosis) (HCC) 12/05/2015   Dyspnea    r/t seasonal allergies   Hypertrophic obstructive cardiomyopathy (HCC)  08/29/2015   Leukocytosis    Macular degeneration    Pneumonia due to COVID-19 virus 08/16/2019   Pulmonary vascular congestion 08/16/2019   QT prolongation 08/29/2015   Stroke (HCC)    Systolic dysfunction with acute on chronic heart failure (HCC)    TBI (traumatic brain injury) (HCC) 2011   Inpatient rehab [facility]   Thrombocytosis    Ventricular fibrillation (HCC) 09/03/15   MDT ICD Dr. Johney Frame    Medications:  Medications Prior to Admission  Medication Sig Dispense Refill Last Dose   Acetylcysteine (NAC) 600 MG CAPS Take 1 capsule (600 mg total) by mouth daily. 30 capsule 11 11/24/2022   ALPRAZolam (XANAX) 0.25 MG tablet Take one-half to one tablet by  mouth 3 times daily as needed for anxiety (Patient taking differently: Take 0.5 mg by mouth at bedtime as needed for anxiety or sleep.) 90 tablet 0 11/23/2022   amLODipine (NORVASC) 5 MG tablet Take 1 tablet (5 mg total) by mouth daily with breakfast. 30 tablet 0 11/24/2022   Ascorbic Acid (VITAMIN C PO) Take 1 capsule by mouth daily.   11/24/2022   aspirin EC 81 MG tablet Take 1 tablet (81 mg total) by mouth daily. Swallow whole.  90 tablet 3 11/24/2022   carvedilol (COREG) 12.5 MG tablet TAKE ONE TABLET BY MOUTH TWICE DAILY WITH BREAKFAST AND DINNER 180 tablet 2 11/24/2022 at 08:00am   cholecalciferol (VITAMIN D3) 25 MCG (1000 UNIT) tablet Take 2,000 Units by mouth daily.   11/24/2022   famotidine (PEPCID) 20 MG tablet Take 20 mg by mouth at bedtime.   11/24/2022   fexofenadine (ALLEGRA) 180 MG tablet Take 180 mg by mouth daily.   11/24/2022   fluticasone (FLONASE) 50 MCG/ACT nasal spray Place 1 spray into both nostrils daily. Use as directed   11/24/2022   furosemide (LASIX) 40 MG tablet Take 1 tablet (40 mg total) by mouth daily. (Patient taking differently: Take 40 mg by mouth 2 (two) times daily. Increased 09/19/22 - pt will call when she needs a new rx) 90 tablet 3 11/24/2022   Ginkgo Biloba 40 MG TABS Take 1 tablet (40 mg total) by mouth 2 (two) times  daily. 180 tablet 11 11/24/2022   losartan (COZAAR) 100 MG tablet Take 1 tablet (100 mg total) by mouth daily. 90 tablet 3 11/24/2022   Multiple Vitamin (MULTIVITAMIN WITH MINERALS) TABS tablet Take 1 tablet by mouth daily.   11/24/2022   Multiple Vitamins-Minerals (OCUVITE PO) Take 1 capsule by mouth daily.   11/24/2022   Oxcarbazepine (TRILEPTAL) 300 MG tablet Take two tablets by mouth every morning, and take one tablet in the evening (Patient taking differently: Take 600 mg by mouth daily. Take two tablets by mouth every morning, and take one-half tablet in the evening) 105 tablet 0 11/24/2022   venlafaxine XR (EFFEXOR XR) 75 MG 24 hr capsule Take 1 capsule (75 mg total) by mouth daily with breakfast. Take with Effexor XR 300 mg 90 capsule 1 11/24/2022   venlafaxine XR (EFFEXOR-XR) 150 MG 24 hr capsule Take 2 capsules (300 mg total) by mouth at bedtime. 180 capsule 1 11/23/2022   atorvastatin (LIPITOR) 20 MG tablet Take 1 tablet (20 mg total) by mouth daily at 6 PM. 30 tablet 0 unknown   Risankizumab-rzaa (SKYRIZI, 150 MG DOSE, [place]) Inject into the skin every 3 (three) months. (Patient not taking: Reported on 08/21/2022)   Not Taking   triamcinolone ointment (KENALOG) 0.5 % Apply 1 application topically as needed.  0 unknown   Scheduled:   amLODipine  5 mg Oral Q breakfast   aspirin EC  81 mg Oral Daily   atorvastatin  20 mg Oral q1800   carvedilol  12.5 mg Oral BID WC   enoxaparin (LOVENOX) injection  120 mg Subcutaneous BID   famotidine  20 mg Oral QHS   furosemide  20 mg Intravenous BID   losartan  100 mg Oral Daily   nystatin cream  1 Application Topical BID   Oxcarbazepine  600 mg Oral Daily   venlafaxine XR  300 mg Oral QHS   venlafaxine XR  75 mg Oral Q breakfast   Infusions:   Assessment: Pt is being r/o for PE with V/Q scan. Lovenox has been ordered empirically for anticoagulation. If ongoing anticoagulation is needed, NOACs will not be a good option due to the oxycabazepine that she  is on for bipolar.  Scr 1.84 - CrCl>30 Hgb 12.2, plt wnl  Goal of Therapy:  Anti-Xa level 0.6-1 units/ml 4hrs after LMWH dose given Monitor platelets by anticoagulation protocol: Yes   Plan:  Lovenox 120mg  SQ BID Monitor CBC F/u V/Q scan  Ulyses Southward, PharmD, BCIDP, AAHIVP, CPP Infectious Disease Pharmacist 11/25/2022 9:54 AM

## 2022-11-25 NOTE — Evaluation (Signed)
Physical Therapy Evaluation Patient Details Name: Marie Ramos MRN: [id number] DOB: 30-Oct-1955 Today's Date: 11/25/2022  History of Present Illness  Pt is 67 yo female admitted on 11/24/22 with acute on chronic CHF, weakness, and difficulty with ambulation. Pt with hx including but not limited to cardiac arrest, anoxic brain injury due to cardiac arrest, CKD, heart failure, bipolar and obesity, CVA  Clinical Impression  Pt admitted with above diagnosis. At baseline, pt was ambulatory with a cane. 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  In the last couple months she has begun having difficulty ambulating the long hallway to her apartment and with frequent falls.  Pt lives with spouse who works part time.  Today, pt required min A for sit to stand and only able to ambulate 15'x2 with min A.  With ambulation pt with unstable knees, slow pattern, and fatigued very easily.  With history of recent falls, being below baseline, and alone at home at times do recommend further therapy prior to returning home.  Pt's family report she has had inpatient rehab after anoxic brain injury and would prefer that setting for intense therapy.  They also asked about aides at home to assist a few hours while spouse at work.  Pt currently with functional limitations due to the deficits listed below (see PT Problem List). Pt will benefit from acute skilled PT to increase their independence and safety with mobility to allow discharge.    Pt on 3 L O2 with sats 95% rest and 87% ambulation.         Recommendations for follow up therapy are one component of a multi-disciplinary discharge planning process, led by the attending physician.  Recommendations may be updated based on patient status, additional functional criteria and insurance authorization.  Follow Up Recommendations       Assistance Recommended at Discharge Frequent or constant Supervision/Assistance   Patient can return home with the following  A little help with walking and/or transfers;A little help with bathing/dressing/bathroom;Assistance with cooking/housework;Help with stairs or ramp for entrance    Equipment Recommendations None recommended by PT  Recommendations for Other Services  Rehab consult    Functional Status Assessment Patient has had a recent decline in their functional status and demonstrates the ability to make significant improvements in function in a reasonable and predictable amount of time.     Precautions / Restrictions Precautions Precautions: Fall      Mobility  Bed Mobility               General bed mobility comments: in chair    Transfers Overall transfer level: Needs assistance Equipment used: Rolling walker (2 wheels) Transfers: Sit to/from Stand Sit to Stand: Min assist           General transfer comment: Cues for hand placement with min A to rise; required repeated cues; performed x 3    Ambulation/Gait Ambulation/Gait assistance: Min assist Gait Distance (Feet): 15 Feet (15'x2) Assistive device: Rolling walker (2 wheels) Gait Pattern/deviations: Step-to pattern, Decreased stride length Gait velocity: decreased     General Gait Details: Knees unstable but not completely buckling (worsening with fatigued) required seated rest break 5 mins between bouts. Cues to stay close to RW and small steps  Stairs            Wheelchair Mobility    Modified Rankin (Stroke Patients Only)       Balance Overall balance assessment: Needs assistance,  History of Falls Sitting-balance support: No upper extremity supported Sitting balance-Leahy Scale: Good     Standing balance support: Bilateral upper extremity supported Standing balance-Leahy Scale: Poor Standing balance comment: Requiring RW                             Pertinent Vitals/Pain Pain Assessment Pain Assessment: 0-10 Pain Score: 1  Pain Location:  generalized soreness Pain Descriptors / Indicators: Sore Pain Intervention(s): Limited activity within patient's tolerance, Monitored during session    Home Living Family/patient expects to be discharged to:: Private residence Living Arrangements: Spouse/significant other Available Help at Discharge: Family;Available PRN/intermittently (spouse works part time) Type of Home: Apartment Home Access: Elevator       Home Layout: One level (decent walk to elevator to enter) Home Equipment: Grab bars - tub/shower;Rolling Walker (2 wheels);Cane - single point;Wheelchair - manual      Prior Function               Mobility Comments: Fearful of falls; Will only ambulate short community distances ; Was using a cane - just got a cane; Has had 6-8 falls past 2 months (reports some falls prior just not as frequent) ADLs Comments: Pt independent with ADLs; Spouse assist with IADLs; pt did still occasionally substitute teach     Hand Dominance        Extremity/Trunk Assessment   Upper Extremity Assessment Upper Extremity Assessment: Overall WFL for tasks assessed    Lower Extremity Assessment Lower Extremity Assessment: LLE deficits/detail;RLE deficits/detail RLE Deficits / Details: ROM WFL; reports knee painful; MMT: ankle 5/5, knee 4/5, hip 4/5 LLE Deficits / Details: ROM WFL; MMT: ankld 5/5, knee 4/5, hip 4/5    Cervical / Trunk Assessment Cervical / Trunk Assessment: Kyphotic;Other exceptions Cervical / Trunk Exceptions: obesity; pt with tremors (reports from meds)  Communication   Communication: No difficulties  Cognition Arousal/Alertness: Awake/alert Behavior During Therapy: WFL for tasks assessed/performed Overall Cognitive Status: History of cognitive impairments - at baseline                                          General Comments General comments (skin integrity, edema, etc.): Educated pt and family on PT role and recommendation for further therapy  at a facility prior to d/c due to fall risk, below baseline, alone at times.  They are in agreement. They report have been to [place] and prefer that setting.    Exercises     Assessment/Plan    PT Assessment Patient needs continued PT services  PT Problem List Decreased strength;Cardiopulmonary status limiting activity;Decreased range of motion;Decreased activity tolerance;Decreased knowledge of use of DME;Decreased balance;Decreased mobility       PT Treatment Interventions DME instruction;Therapeutic exercise;Gait training;Balance training;Modalities;Functional mobility training;Therapeutic activities;Patient/family education    PT Goals (Current goals can be found in the Care Plan section)  Acute Rehab PT Goals Patient Stated Goal: wants to be able to walk, lose weight, and be more mobile PT Goal Formulation: With patient/family Time For Goal Achievement: 12/09/22 Potential to Achieve Goals: Good    Frequency Min 1X/week     Co-evaluation               AM-PAC PT "6 Clicks" Mobility  Outcome Measure Help needed turning from your back to your side while in a  flat bed without using bedrails?: A Lot Help needed moving from lying on your back to sitting on the side of a flat bed without using bedrails?: A Lot Help needed moving to and from a bed to a chair (including a wheelchair)?: A Little Help needed standing up from a chair using your arms (e.g., wheelchair or bedside chair)?: A Little Help needed to walk in hospital room?: Total (limited distance and speed) Help needed climbing 3-5 steps with a railing? : Total 6 Click Score: 12    End of Session Equipment Utilized During Treatment: Gait belt Activity Tolerance: Patient tolerated treatment well Patient left: with call bell/phone within reach;in chair;with family/visitor present (family present, knows to call out, was in chair at arrival) Nurse Communication: Mobility status PT Visit  Diagnosis: Other abnormalities of gait and mobility (R26.89);Muscle weakness (generalized) (M62.81)    Time: 4098-1191 PT Time Calculation (min) (ACUTE ONLY): 33 min   Charges:   PT Evaluation $PT Eval Low Complexity: 1 Low PT Treatments $Gait Training: 8-22 mins        Anise Salvo, PT Acute Rehab [facility] [phone]   Rayetta Humphrey 11/25/2022, 5:27 PM

## 2022-11-26 DIAGNOSIS — G931 Anoxic brain damage, not elsewhere classified: Secondary | ICD-10-CM

## 2022-11-26 DIAGNOSIS — I5033 Acute on chronic diastolic (congestive) heart failure: Secondary | ICD-10-CM | POA: Diagnosis not present

## 2022-11-26 DIAGNOSIS — W19XXXD Unspecified fall, subsequent encounter: Secondary | ICD-10-CM

## 2022-11-26 DIAGNOSIS — R5381 Other malaise: Secondary | ICD-10-CM | POA: Diagnosis not present

## 2022-11-26 DIAGNOSIS — F3170 Bipolar disorder, currently in remission, most recent episode unspecified: Secondary | ICD-10-CM

## 2022-11-26 LAB — MAGNESIUM: Magnesium: 2.4 mg/dL (ref 1.7–2.4)

## 2022-11-26 LAB — BASIC METABOLIC PANEL
Anion gap: 6 (ref 5–15)
BUN: 46 mg/dL — ABNORMAL HIGH (ref 8–23)
CO2: 34 mmol/L — ABNORMAL HIGH (ref 22–32)
Calcium: 8.9 mg/dL (ref 8.9–10.3)
Chloride: 97 mmol/L — ABNORMAL LOW (ref 98–111)
Creatinine, Ser: 1.67 mg/dL — ABNORMAL HIGH (ref 0.44–1.00)
GFR, Estimated: 33 mL/min — ABNORMAL LOW (ref >60)
Glucose, Bld: 99 mg/dL (ref 70–99)
Potassium: 4.1 mmol/L (ref 3.5–5.1)
Sodium: 137 mmol/L (ref 135–145)

## 2022-11-26 MED ORDER — VENLAFAXINE HCL ER 75 MG PO CP24
150.0000 mg | ORAL_CAPSULE | Freq: Every day | ORAL | Status: DC
  Administered 2022-11-26 – 2022-12-01 (×6): 150 mg via ORAL
  Filled 2022-11-26 (×6): qty 2

## 2022-11-26 MED ORDER — OXCARBAZEPINE 300 MG PO TABS
300.0000 mg | ORAL_TABLET | Freq: Every day | ORAL | Status: DC
  Administered 2022-11-27 – 2022-12-02 (×6): 300 mg via ORAL
  Filled 2022-11-26 (×6): qty 1

## 2022-11-26 MED ORDER — EMPAGLIFLOZIN 10 MG PO TABS
10.0000 mg | ORAL_TABLET | Freq: Every day | ORAL | Status: DC
  Administered 2022-11-26 – 2022-12-02 (×7): 10 mg via ORAL
  Filled 2022-11-26 (×7): qty 1

## 2022-11-26 MED ORDER — FUROSEMIDE 10 MG/ML IJ SOLN
40.0000 mg | Freq: Two times a day (BID) | INTRAMUSCULAR | Status: DC
  Administered 2022-11-26 – 2022-11-27 (×3): 40 mg via INTRAVENOUS
  Filled 2022-11-26 (×3): qty 4

## 2022-11-26 NOTE — Consult Note (Signed)
Physical Medicine and Rehabilitation Consult Reason for Consult:rehab Referring Physician: Dr. Joya Martyr      HPI: Marie Ramos is a 67 y.o. female with past medical history of anoxic brain injury, prior CVA, bipolar disorder, morbid obesity, borderline diabetes, history of cardiac arrest status post ICD, CHF who had multiple recent falls.  Her last fall was about 3 days prior to her admission and since that time she was not able to get up and ambulate on her own.  She feels like her weakness is related to her Trileptal medication that was causing her to have worse balance.  This was recently decreased by psychiatry.  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  She was found to have deconditioning and volume overload.  She was started on IV Lasix for acute on chronic CHF.  She had a VQ scan that was low probability for pulmonary embolism.  She reports some knee tenderness due to her falls bilaterally. She lives with her husband in a condo with elevator access.  Husband works about 30 hours a week and she would not have anyone else to assist her during this time.  After discharge.     Review of Systems  Constitutional:  Negative for chills and fever.  HENT:  Negative for congestion and hearing loss.   Eyes:  Negative for double vision.  Respiratory:  Positive for shortness of breath.   Cardiovascular:  Negative for chest pain.  Gastrointestinal:  Negative for abdominal pain, nausea and vomiting.  Genitourinary: Negative.   Musculoskeletal:  Positive for falls and joint pain.  Neurological:  Positive for sensory change and weakness. Negative for dizziness and speech change.  Psychiatric/Behavioral:  Positive for depression.         Past Medical History:  Diagnosis Date   Abnormality of gait     Acute bilat watershed infarction [facility])     Acute lower UTI     Acute metabolic encephalopathy 08/16/2019   Acute respiratory failure (HCC)      Acute systolic congestive heart failure (HCC)     AKI (acute kidney injury) (HCC)     Anoxic brain damage (HCC)     Bipolar 1 disorder (HCC)     Bipolar affective disorder in remission (HCC)     Cardiac arrest (HCC) 08/24/2015   Cerebral infarction due to embolism of cerebral artery (HCC)     Cerebrovascular accident (CVA) due to embolism of cerebral artery (HCC) 12/05/2015   Chronically dry eyes     Closed fracture of left distal radius 12/01/2018   DVT (deep venous thrombosis) (HCC) 12/05/2015   Dyspnea      r/t seasonal allergies   Hypertrophic obstructive cardiomyopathy (HCC) 08/29/2015   Leukocytosis     Macular degeneration     Pneumonia due to COVID-19 virus 08/16/2019   Pulmonary vascular congestion 08/16/2019   QT prolongation 08/29/2015   Stroke (HCC)     Systolic dysfunction with acute on chronic heart failure (HCC)     TBI (traumatic brain injury) (HCC) 2011    Inpatient rehab [facility]   Thrombocytosis     Ventricular fibrillation (HCC) 09/03/15    MDT ICD Dr. Johney Frame         Past Surgical History:  Procedure Laterality Date   ABDOMINAL HYSTERECTOMY       CARDIAC CATHETERIZATION N/A 08/24/2015    Procedure: Left Heart Cath and Coronary Angiography;  Surgeon: Casimiro Needle  Excell Seltzer, MD;  Location: [facility] INVASIVE CV LAB;  Service: Cardiovascular;  Laterality: N/A;   EP IMPLANTABLE DEVICE N/A 09/03/2015    MDT dual chamber ICD   OPEN REDUCTION INTERNAL FIXATION (ORIF) DISTAL RADIAL FRACTURE Left 12/01/2018    Procedure: OPEN REDUCTION INTERNAL FIXATION (ORIF) LEFT DISTAL RADIAL FRACTURE;  Surgeon: Bjorn Pippin, MD;  Location: [facility] ORS;  Service: Orthopedics;  Laterality: Left;   SPLENECTOMY, TOTAL   2011   TONSILLECTOMY             Family History  Problem Relation Age of Onset   Cancer Mother     Depression Mother     CAD Mother     COPD Father     Depression Father     Diabetes Maternal Grandfather     Depression Maternal Grandfather     Arthritis/Rheumatoid Paternal Grandmother       Social History:  reports that she has never smoked. She has never used smokeless tobacco. She reports that she does not drink alcohol and does not use drugs. Allergies:       Allergies  Allergen Reactions   Sulfa Antibiotics Itching      Face neck red   Albuterol Itching   Other Hives   Sulfamethoxazole-Trimethoprim Hives   Vicodin Hp [Hydrocodone-Acetaminophen] Hives   Naproxen Itching   Vicodin [Hydrocodone-Acetaminophen] Itching          Medications Prior to Admission  Medication Sig Dispense Refill   Acetylcysteine (NAC) 600 MG CAPS Take 1 capsule (600 mg total) by mouth daily. 30 capsule 11   ALPRAZolam (XANAX) 0.25 MG tablet Take one-half to one tablet by  mouth 3 times daily as needed for anxiety (Patient taking differently: Take 0.5 mg by mouth at bedtime as needed for anxiety or sleep.) 90 tablet 0   amLODipine (NORVASC) 5 MG tablet Take 1 tablet (5 mg total) by mouth daily with breakfast. 30 tablet 0   Ascorbic Acid (VITAMIN C PO) Take 1 capsule by mouth daily.       aspirin EC 81 MG tablet Take 1 tablet (81 mg total) by mouth daily. Swallow whole. 90 tablet 3   carvedilol (COREG) 12.5 MG tablet TAKE ONE TABLET BY MOUTH TWICE DAILY WITH BREAKFAST AND DINNER 180 tablet 2   cholecalciferol (VITAMIN D3) 25 MCG (1000 UNIT) tablet Take 2,000 Units by mouth daily.       famotidine (PEPCID) 20 MG tablet Take 20 mg by mouth at bedtime.       fexofenadine (ALLEGRA) 180 MG tablet Take 180 mg by mouth daily.       fluticasone (FLONASE) 50 MCG/ACT nasal spray Place 1 spray into both nostrils daily. Use as directed       furosemide (LASIX) 40 MG tablet Take 1 tablet (40 mg total) by mouth daily. (Patient taking differently: Take 40 mg by mouth 2 (two) times daily. Increased 09/19/22 - pt will call when she needs a new rx) 90 tablet 3   Ginkgo Biloba 40 MG TABS Take 1 tablet (40 mg total) by mouth 2 (two) times daily. 180 tablet 11   losartan (COZAAR) 100 MG tablet Take 1 tablet (100 mg  total) by mouth daily. 90 tablet 3   Multiple Vitamin (MULTIVITAMIN WITH MINERALS) TABS tablet Take 1 tablet by mouth daily.       Multiple Vitamins-Minerals (OCUVITE PO) Take 1 capsule by mouth daily.       Oxcarbazepine (TRILEPTAL) 300 MG tablet Take two tablets by mouth  every morning, and take one tablet in the evening (Patient taking differently: Take 600 mg by mouth daily. Take two tablets by mouth every morning, and take one-half tablet in the evening) 105 tablet 0   venlafaxine XR (EFFEXOR XR) 75 MG 24 hr capsule Take 1 capsule (75 mg total) by mouth daily with breakfast. Take with Effexor XR 300 mg 90 capsule 1   venlafaxine XR (EFFEXOR-XR) 150 MG 24 hr capsule Take 2 capsules (300 mg total) by mouth at bedtime. 180 capsule 1   atorvastatin (LIPITOR) 20 MG tablet Take 1 tablet (20 mg total) by mouth daily at 6 PM. 30 tablet 0   Risankizumab-rzaa (SKYRIZI, 150 MG DOSE, ) Inject into the skin every 3 (three) months. (Patient not taking: Reported on 08/21/2022)       triamcinolone ointment (KENALOG) 0.5 % Apply 1 application topically as needed.   0      Home: Home Living Family/patient expects to be discharged to:: Private residence Living Arrangements: Spouse/significant other Available Help at Discharge: Family, Available PRN/intermittently (spouse works part time) Type of Home: Apartment Home Access: Elevator Home Layout: One level (decent walk to elevator to enter) Bathroom Shower/Tub: Engineer, manufacturing systems: Handicapped height Home Equipment: Grab bars - tub/shower, Agricultural consultant (2 wheels), [employer] - single point, Wheelchair - manual  Functional History: Prior Function Mobility Comments: Fearful of falls; Will only ambulate short community distances ; Was using a cane - just got a cane; Has had 6-8 falls past 2 months (reports some falls prior just not as frequent) ADLs Comments: Pt independent with ADLs; Spouse assist with IADLs; pt did still occasionally substitute  teach Functional Status:  Mobility: Bed Mobility General bed mobility comments: in chair Transfers Overall transfer level: Needs assistance Equipment used: Rolling walker (2 wheels) Transfers: Sit to/from Stand Sit to Stand: Min assist General transfer comment: Cues for hand placement with min A to rise; required repeated cues; performed x 3 Ambulation/Gait Ambulation/Gait assistance: Min assist Gait Distance (Feet): 15 Feet (15'x2) Assistive device: Rolling walker (2 wheels) Gait Pattern/deviations: Step-to pattern, Decreased stride length General Gait Details: Knees unstable but not completely buckling (worsening with fatigued) required seated rest break 5 mins between bouts. Cues to stay close to RW and small steps Gait velocity: decreased   ADL:   Cognition: Cognition Overall Cognitive Status: History of cognitive impairments - at baseline Orientation Level: Oriented X4 Cognition Arousal/Alertness: Awake/alert Behavior During Therapy: WFL for tasks assessed/performed Overall Cognitive Status: History of cognitive impairments - at baseline   Blood pressure (!) 142/73, pulse 70, temperature 98.5 F (36.9 C), temperature source Oral, resp. rate 17, height 5\' 2"  (1.575 m), weight 117.9 kg, SpO2 98 %. Physical Exam     General: No apparent distress HEENT: Head is normocephalic, atraumatic, PERRLA, EOMI, sclera anicteric, oral mucosa pink and moist, wearing glasses Neck: Supple Heart: Reg rate and rhythm. No murmurs rubs or gallops Chest: CTA bilaterally, + [place] Abdomen: Soft, non-tender, non-distended, bowel sounds positive. Extremities: No clubbing, cyanosis, or edema. Pulses are 2+ Psych: Pt's affect is appropriate. Pt is cooperative Skin: Clean and intact without signs of breakdown Neuro:  Alert and oriented x 3, follows commands, CN 2-12 intact, tongue deviates slightly to right, Responses a little delayed at times, able to name and repeat, recall 2/3 items at 2 minutes   Hoffman's positive on Left No ankle clonus FNT intact B/l Strength 5/5 in b/l UE Strength 4+/5 proximal, 5/5 distal B/l LE Sensation intact to LT in  all 4   Musculoskeletal:  RUE IV- looks ok Scabs B/L knees Tenderness around b/l Knees, no swelling or erythema    Lab Results Last 24 Hours       Results for orders placed or performed during the hospital encounter of 11/24/22 (from the past 24 hour(s))  Basic metabolic panel     Status: Abnormal    Collection Time: 11/26/22  1:16 AM  Result Value Ref Range    Sodium 137 135 - 145 mmol/L    Potassium 4.1 3.5 - 5.1 mmol/L    Chloride 97 (L) 98 - 111 mmol/L    CO2 34 (H) 22 - 32 mmol/L    Glucose, Bld 99 70 - 99 mg/dL    BUN 46 (H) 8 - 23 mg/dL    Creatinine, Ser 1.61 (H) 0.44 - 1.00 mg/dL    Calcium 8.9 8.9 - 09.6 mg/dL    GFR, Estimated 33 (L) >60 mL/min    Anion gap 6 5 - 15  Magnesium     Status: None    Collection Time: 11/26/22  1:16 AM  Result Value Ref Range    Magnesium 2.4 1.7 - 2.4 mg/dL       Imaging Results (Last 48 hours)  VAS [name] LOWER EXTREMITY VENOUS (DVT) (ONLY [facility])   Result Date: 11/25/2022  Lower Venous DVT Study Patient Name:  LILLYROSE SLATTON  Date of Exam:   11/24/2022 Medical Rec #: [id number]          Accession #:    [id number] Date of Birth: Oct 22, 1955          Patient Gender: F Patient Age:   75 years [facility]am Location[facility] Procedure:      VAS [name] LOWER EXTREMITY VENOUS (DVT) Referring Phys: MATTHEW TRIFAN --------------------------------------------------------------------------------  Indications: Edema.  Risk Factors: None identified. Limitations: Poor ultrasound/tissue interface. Comparison Study: No prior studies. Performing Technologist: Chanda Busing RVT  Examination Guidelines: A complete evaluation includes B-mode imaging, spectral Doppler, color Doppler, and power Doppler as needed of all accessible portions of each vessel. Bilateral testing is considered an integral part of a  complete examination. Limited examinations for reoccurring indications may be performed as noted. The reflux portion of the exam is performed with the patient in reverse Trendelenburg.  +---------+---------------+---------+-----------+----------+--------------+ RIGHT    CompressibilityPhasicitySpontaneityPropertiesThrombus Aging +---------+---------------+---------+-----------+----------+--------------+ CFV      Full           Yes      Yes                                 +---------+---------------+---------+-----------+----------+--------------+ SFJ      Full                                                        +---------+---------------+---------+-----------+----------+--------------+ FV Prox  Full                                                        +---------+---------------+---------+-----------+----------+--------------+ FV Mid   Full                                                        +---------+---------------+---------+-----------+----------+--------------+  FV DistalFull                                                        +---------+---------------+---------+-----------+----------+--------------+ PFV      Full                                                        +---------+---------------+---------+-----------+----------+--------------+ POP      Full           Yes      Yes                                 +---------+---------------+---------+-----------+----------+--------------+ PTV      Full                                                        +---------+---------------+---------+-----------+----------+--------------+ PERO     Full                                                        +---------+---------------+---------+-----------+----------+--------------+   +---------+---------------+---------+-----------+----------+--------------+ LEFT     CompressibilityPhasicitySpontaneityPropertiesThrombus Aging  +---------+---------------+---------+-----------+----------+--------------+ CFV      Full           Yes      Yes                                 +---------+---------------+---------+-----------+----------+--------------+ SFJ      Full                                                        +---------+---------------+---------+-----------+----------+--------------+ FV Prox  Full                                                        +---------+---------------+---------+-----------+----------+--------------+ FV Mid   Full                                                        +---------+---------------+---------+-----------+----------+--------------+ FV Distal               Yes      Yes                                 +---------+---------------+---------+-----------+----------+--------------+  PFV      Full                                                        +---------+---------------+---------+-----------+----------+--------------+ POP      Full           Yes      Yes                                 +---------+---------------+---------+-----------+----------+--------------+ PTV      Full                                                        +---------+---------------+---------+-----------+----------+--------------+ PERO     Full                                                        +---------+---------------+---------+-----------+----------+--------------+     Summary: RIGHT: - There is no evidence of deep vein thrombosis in the lower extremity.  - No cystic structure found in the popliteal fossa.  LEFT: - There is no evidence of deep vein thrombosis in the lower extremity. However, portions of this examination were limited- see technologist comments above.  - No cystic structure found in the popliteal fossa.  *See table(s) above for measurements and observations. Electronically signed by Coral Else MD on 11/25/2022 at 7:17:17 PM.    Final     NM  Pulmonary Perfusion   Result Date: 11/25/2022 CLINICAL DATA:  Shortness of breath EXAM: NUCLEAR MEDICINE PERFUSION LUNG SCAN TECHNIQUE: Perfusion images were obtained in multiple projections after intravenous injection of radiopharmaceutical. Ventilation scans intentionally deferred if perfusion scan and chest x-ray adequate for interpretation during COVID 19 epidemic. RADIOPHARMACEUTICALS:  4.6 mCi Tc-28m MAA IV COMPARISON:  X-ray 11/24/2022 FINDINGS: Focal defect along the left hemithorax consistent with patient's battery pack for defibrillator. Mediastinal shadows enlarged consistent with patient's enlarged heart. Otherwise only a few small subsegmental peripheral defects identified. No larger defects seen on perfusion. IMPRESSION: Very low probability perfusion lung scan Electronically Signed   By: Karen Kays M.D.   On: 11/25/2022 12:43    CT HEAD WO CONTRAST   Result Date: 11/24/2022 CLINICAL DATA:  Syncope/presyncope EXAM: CT HEAD WITHOUT CONTRAST TECHNIQUE: Contiguous axial images were obtained from the base of the skull through the vertex without intravenous contrast. RADIATION DOSE REDUCTION: This exam was performed according to the departmental dose-optimization program which includes automated exposure control, adjustment of the mA and/or kV according to patient size and/or use of iterative reconstruction technique. COMPARISON:  08/16/2019 FINDINGS: Brain: No acute intracranial findings are seen. Ventricles are not dilated. There are no signs of bleeding. There is no focal mass effect. Cortical sulci are prominent. Vascular: Coarse calcifications are seen arterial branches. Skull: No acute findings are seen. Sinuses/Orbits: There is mucosal thickening in the ethmoid sinus, especially in the posterior ethmoid sinus on the right side. Other: There is increased amount of fluid  density in sella suggesting possible partial empty sella. IMPRESSION: No acute intracranial findings are seen in noncontrast  CT brain. Electronically Signed   By: Ernie Avena M.D.   On: 11/24/2022 13:00    DG Knee 2 Views Right   Result Date: 11/24/2022 CLINICAL DATA:  Fall.  Bilateral anterior knee abrasions. EXAM: RIGHT KNEE - 1-2 VIEW COMPARISON:  None Available. FINDINGS: There is diffuse decreased bone mineralization. Moderate to severe medial compartment joint space narrowing and mild-to-moderate peripheral osteophytosis. Mild peripheral lateral compartment degenerative osteophytosis without joint space narrowing. Moderate patellofemoral joint space narrowing and peripheral osteophytosis. A 5 mm well corticated chronic ossicle is seen inferior to the patella, overlying Hoffa's fat pad on lateral view. Small joint effusion. No acute fracture or dislocation. IMPRESSION: 1. Moderate-to-severe medial and moderate patellofemoral compartment osteoarthritis. 2. Small joint effusion. Electronically Signed   By: Neita Garnet M.D.   On: 11/24/2022 12:37    DG Knee 2 Views Left   Result Date: 11/24/2022 CLINICAL DATA:  Fall.  Bilateral knee anterior abrasions. EXAM: LEFT KNEE - 1-2 VIEW COMPARISON:  None Available. FINDINGS: There is diffuse decreased bone mineralization. Severe medial compartment joint space narrowing with bone-on-bone contact, subchondral sclerosis, and moderate peripheral osteophytosis. Mild peripheral glenoid degenerative osteophytosis without joint space narrowing. Severe patellofemoral joint space narrowing and peripheral osteophytosis. Well-corticated chronic 15 mm loose body overlying the distal anterior femoral metaphyseal cortex and the suprapatellar joint space. Small joint effusion. Mild-to-moderate chronic enthesopathic change at the quadriceps insertion on the patella. No acute fracture or dislocation. IMPRESSION: 1. Severe medial and patellofemoral compartment osteoarthritis. 2. Small joint effusion. 3. Chronic 15 mm loose body within the suprapatellar joint space. Electronically Signed   By:  Neita Garnet M.D.   On: 11/24/2022 12:36    DG Chest Port 1 View   Result Date: 11/24/2022 CLINICAL DATA:  Fall.  Shortness of breath. EXAM: PORTABLE CHEST 1 VIEW COMPARISON:  Chest radiographs 08/16/2019 and 09/04/2015 FINDINGS: Cardiac silhouette is moderately to markedly enlarged, stable to increased from 08/16/2019 most recent prior. Mediastinal contours are within normal limits. Calcification is again seen within the aortic arch. Left chest wall cardiac AICD with leads again overlying the right atrium and right ventricle. Mild-to-moderate bilateral interstitial thickening is mildly improved from 08/16/2019 and similar to 09/04/2015. No definite pleural effusion. No pneumothorax. Old healed bilateral rib fractures are grossly similar to prior. IMPRESSION: 1. Mild-to-moderate bilateral interstitial thickening, mildly improved from 08/16/2019 and similar to 09/04/2015. Findings may represent interstitial scarring. Improved, mild interstitial pulmonary edema is also possible. 2. Moderate to marked cardiomegaly, stable to increased from 08/16/2019. Electronically Signed   By: Neita Garnet M.D.   On: 11/24/2022 12:34       Assessment/Plan: Diagnosis: Debility due to acute on chronic CHF with history of cognitive impairment due to Anoxic brain injury and CVA Does the need for close, 24 hr/day medical supervision in concert with the patient's rehab needs make it unreasonable for this patient to be served in a less intensive setting? Yes Co-Morbidities requiring supervision/potential complications:  -hx of anoxic brain injury and CVA, bipolar disorder, morbid obesity, multiple falls Due to bladder management, bowel management, safety, skin/wound care, disease management, medication administration, pain management, and patient education, does the patient require 24 hr/day rehab nursing? Yes Does the patient require coordinated care of a physician, rehab nurse, therapy disciplines of PT/OT to address  physical and functional deficits in the context of the above medical diagnosis(es)? Yes Addressing deficits in the  following areas: balance, endurance, locomotion, strength, transferring, bowel/bladder control, bathing, dressing, feeding, grooming, toileting, cognition, speech, language, swallowing, and psychosocial support Can the patient actively participate in an intensive therapy program of at least 3 hrs of therapy per day at least 5 days per week? Yes The potential for patient to make measurable gains while on inpatient rehab is excellent Anticipated functional outcomes upon discharge from inpatient rehab are modified independent and supervision  with PT, modified independent and supervision with OT, n/a with SLP. Estimated rehab length of stay to reach the above functional goals is: 7-10 Anticipated discharge destination: Home Overall Rehab/Functional Prognosis: good   POST ACUTE RECOMMENDATIONS: This patient's condition is appropriate for continued rehabilitative care in the following setting: SNF Patient has agreed to participate in recommended program. Yes Note that insurance prior authorization may be required for reimbursement for recommended care.   Comment: Patient would be a good candidate for SNF versus CIR.  Currently she does not have 24/7 assistance at discharge and would likely be a better candidate for SNF unless the situation changes.     I have personally performed a face to face diagnostic evaluation of this patient. Additionally, I have examined the patient's medical record including any pertinent labs and radiographic images. If the physician assistant has documented in this note, I have reviewed and edited or otherwise concur with the physician assistant's documentation.   Thanks,   Fanny Dance, MD 11/26/2022

## 2022-11-26 NOTE — Progress Notes (Deleted)
Physical Medicine and Rehabilitation Consult Reason for Consult:rehab Referring Physician: Dr. Joya Martyr    HPI: Marie Ramos is a 67 y.o. female with past medical history of anoxic brain injury, prior CVA, bipolar disorder, morbid obesity, borderline diabetes, history of cardiac arrest status post ICD, CHF who had multiple recent falls.  Her last fall was about 3 days prior to her admission and since that time she was not able to get up and ambulate on her own.  She feels like her weakness is related to her Trileptal medication that was causing her to have worse balance.  This was recently decreased by psychiatry.  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  She was found to have deconditioning and volume overload.  She was started on IV Lasix for acute on chronic CHF.  She had a VQ scan that was low probability for pulmonary embolism.  She reports some knee tenderness due to her falls bilaterally. She lives with her husband in a condo with elevator access.  Husband works about 30 hours a week and she would not have anyone else to assist her during this time.  After discharge.   Review of Systems  Constitutional:  Negative for chills and fever.  HENT:  Negative for congestion and hearing loss.   Eyes:  Negative for double vision.  Respiratory:  Positive for shortness of breath.   Cardiovascular:  Negative for chest pain.  Gastrointestinal:  Negative for abdominal pain, nausea and vomiting.  Genitourinary: Negative.   Musculoskeletal:  Positive for falls and joint pain.  Neurological:  Positive for sensory change and weakness. Negative for dizziness and speech change.  Psychiatric/Behavioral:  Positive for depression.    Past Medical History:  Diagnosis Date   Abnormality of gait    Acute bilat watershed infarction [facility])    Acute lower UTI    Acute metabolic encephalopathy 08/16/2019   Acute respiratory failure (HCC)    Acute systolic  congestive heart failure (HCC)    AKI (acute kidney injury) (HCC)    Anoxic brain damage (HCC)    Bipolar 1 disorder (HCC)    Bipolar affective disorder in remission (HCC)    Cardiac arrest (HCC) 08/24/2015   Cerebral infarction due to embolism of cerebral artery (HCC)    Cerebrovascular accident (CVA) due to embolism of cerebral artery (HCC) 12/05/2015   Chronically dry eyes    Closed fracture of left distal radius 12/01/2018   DVT (deep venous thrombosis) (HCC) 12/05/2015   Dyspnea    r/t seasonal allergies   Hypertrophic obstructive cardiomyopathy (HCC) 08/29/2015   Leukocytosis    Macular degeneration    Pneumonia due to COVID-19 virus 08/16/2019   Pulmonary vascular congestion 08/16/2019   QT prolongation 08/29/2015   Stroke (HCC)    Systolic dysfunction with acute on chronic heart failure (HCC)    TBI (traumatic brain injury) (HCC) 2011   Inpatient rehab [facility]   Thrombocytosis    Ventricular fibrillation (HCC) 09/03/15   MDT ICD Dr. Johney Frame   Past Surgical History:  Procedure Laterality Date   ABDOMINAL HYSTERECTOMY     CARDIAC CATHETERIZATION N/A 08/24/2015   Procedure: Left Heart Cath and Coronary Angiography;  Surgeon: Tonny Bollman, MD;  Location[facility] INVASIVE CV LAB;  Service: Cardiovascular;  Laterality: N/A;   EP IMPLANTABLE DEVICE N/A 09/03/2015   MDT dual chamber ICD   OPEN REDUCTION INTERNAL FIXATION (ORIF) DISTAL RADIAL FRACTURE Left 12/01/2018  Procedure: OPEN REDUCTION INTERNAL FIXATION (ORIF) LEFT DISTAL RADIAL FRACTURE;  Surgeon: Bjorn Pippin, MD;  Location: [facility] ORS;  Service: Orthopedics;  Laterality: Left;   SPLENECTOMY, TOTAL  2011   TONSILLECTOMY     Family History  Problem Relation Age of Onset   Cancer Mother    Depression Mother    CAD Mother    COPD Father    Depression Father    Diabetes Maternal Grandfather    Depression Maternal Grandfather    Arthritis/Rheumatoid Paternal Grandmother    Social History:  reports that she has never smoked. She has  never used smokeless tobacco. She reports that she does not drink alcohol and does not use drugs. Allergies:  Allergies  Allergen Reactions   Sulfa Antibiotics Itching    Face neck red   Albuterol Itching   Other Hives   Sulfamethoxazole-Trimethoprim Hives   Vicodin Hp [Hydrocodone-Acetaminophen] Hives   Naproxen Itching   Vicodin [Hydrocodone-Acetaminophen] Itching   Medications Prior to Admission  Medication Sig Dispense Refill   Acetylcysteine (NAC) 600 MG CAPS Take 1 capsule (600 mg total) by mouth daily. 30 capsule 11   ALPRAZolam (XANAX) 0.25 MG tablet Take one-half to one tablet by  mouth 3 times daily as needed for anxiety (Patient taking differently: Take 0.5 mg by mouth at bedtime as needed for anxiety or sleep.) 90 tablet 0   amLODipine (NORVASC) 5 MG tablet Take 1 tablet (5 mg total) by mouth daily with breakfast. 30 tablet 0   Ascorbic Acid (VITAMIN C PO) Take 1 capsule by mouth daily.     aspirin EC 81 MG tablet Take 1 tablet (81 mg total) by mouth daily. Swallow whole. 90 tablet 3   carvedilol (COREG) 12.5 MG tablet TAKE ONE TABLET BY MOUTH TWICE DAILY WITH BREAKFAST AND DINNER 180 tablet 2   cholecalciferol (VITAMIN D3) 25 MCG (1000 UNIT) tablet Take 2,000 Units by mouth daily.     famotidine (PEPCID) 20 MG tablet Take 20 mg by mouth at bedtime.     fexofenadine (ALLEGRA) 180 MG tablet Take 180 mg by mouth daily.     fluticasone (FLONASE) 50 MCG/ACT nasal spray Place 1 spray into both nostrils daily. Use as directed     furosemide (LASIX) 40 MG tablet Take 1 tablet (40 mg total) by mouth daily. (Patient taking differently: Take 40 mg by mouth 2 (two) times daily. Increased 09/19/22 - pt will call when she needs a new rx) 90 tablet 3   Ginkgo Biloba 40 MG TABS Take 1 tablet (40 mg total) by mouth 2 (two) times daily. 180 tablet 11   losartan (COZAAR) 100 MG tablet Take 1 tablet (100 mg total) by mouth daily. 90 tablet 3   Multiple Vitamin (MULTIVITAMIN WITH MINERALS) TABS  tablet Take 1 tablet by mouth daily.     Multiple Vitamins-Minerals (OCUVITE PO) Take 1 capsule by mouth daily.     Oxcarbazepine (TRILEPTAL) 300 MG tablet Take two tablets by mouth every morning, and take one tablet in the evening (Patient taking differently: Take 600 mg by mouth daily. Take two tablets by mouth every morning, and take one-half tablet in the evening) 105 tablet 0   venlafaxine XR (EFFEXOR XR) 75 MG 24 hr capsule Take 1 capsule (75 mg total) by mouth daily with breakfast. Take with Effexor XR 300 mg 90 capsule 1   venlafaxine XR (EFFEXOR-XR) 150 MG 24 hr capsule Take 2 capsules (300 mg total) by mouth at bedtime. 180 capsule 1  atorvastatin (LIPITOR) 20 MG tablet Take 1 tablet (20 mg total) by mouth daily at 6 PM. 30 tablet 0   Risankizumab-rzaa (SKYRIZI, 150 MG DOSE, [place]) Inject into the skin every 3 (three) months. (Patient not taking: Reported on 08/21/2022)     triamcinolone ointment (KENALOG) 0.5 % Apply 1 application topically as needed.  0    Home: Home Living Family/patient expects to be discharged to:: Private residence Living Arrangements: Spouse/significant other Available Help at Discharge: Family, Available PRN/intermittently (spouse works part time) Type of Home: Apartment Home Access: Elevator Home Layout: One level (decent walk to elevator to enter) Bathroom Shower/Tub: Engineer, manufacturing systems: Handicapped height Home Equipment: Grab bars - tub/shower, Agricultural consultant (2 wheels), [employer] - single point, Wheelchair - manual  Functional History: Prior Function Mobility Comments: Fearful of falls; Will only ambulate short community distances ; Was using a cane - just got a cane; Has had 6-8 falls past 2 months (reports some falls prior just not as frequent) ADLs Comments: Pt independent with ADLs; Spouse assist with IADLs; pt did still occasionally substitute teach Functional Status:  Mobility: Bed Mobility General bed mobility comments: in  chair Transfers Overall transfer level: Needs assistance Equipment used: Rolling walker (2 wheels) Transfers: Sit to/from Stand Sit to Stand: Min assist General transfer comment: Cues for hand placement with min A to rise; required repeated cues; performed x 3 Ambulation/Gait Ambulation/Gait assistance: Min assist Gait Distance (Feet): 15 Feet (15'x2) Assistive device: Rolling walker (2 wheels) Gait Pattern/deviations: Step-to pattern, Decreased stride length General Gait Details: Knees unstable but not completely buckling (worsening with fatigued) required seated rest break 5 mins between bouts. Cues to stay close to RW and small steps Gait velocity: decreased    ADL:    Cognition: Cognition Overall Cognitive Status: History of cognitive impairments - at baseline Orientation Level: Oriented X4 Cognition Arousal/Alertness: Awake/alert Behavior During Therapy: WFL for tasks assessed/performed Overall Cognitive Status: History of cognitive impairments - at baseline  Blood pressure (!) 142/73, pulse 70, temperature 98.5 F (36.9 C), temperature source Oral, resp. rate 17, height 5\' 2"  (1.575 m), weight 117.9 kg, SpO2 98 %. Physical Exam   General: No apparent distress HEENT: Head is normocephalic, atraumatic, PERRLA, EOMI, sclera anicteric, oral mucosa pink and moist, wearing glasses Neck: Supple Heart: Reg rate and rhythm. No murmurs rubs or gallops Chest: CTA bilaterally, + [place] Abdomen: Soft, non-tender, non-distended, bowel sounds positive. Extremities: No clubbing, cyanosis, or edema. Pulses are 2+ Psych: Pt's affect is appropriate. Pt is cooperative Skin: Clean and intact without signs of breakdown Neuro:  Alert and oriented x 3, follows commands, CN 2-12 intact, tongue deviates slightly to right, Responses a little delayed at times, able to name and repeat, recall 2/3 items at 2 minutes  Hoffman's positive on Left No ankle clonus FNT intact B/l Strength 5/5 in b/l  UE Strength 4+/5 proximal, 5/5 distal B/l LE Sensation intact to LT in all 4  Musculoskeletal:  RUE IV- looks ok Scabs B/L knees Tenderness around b/l Knees, no swelling or erythema   Results for orders placed or performed during the hospital encounter of 11/24/22 (from the past 24 hour(s))  Basic metabolic panel     Status: Abnormal   Collection Time: 11/26/22  1:16 AM  Result Value Ref Range   Sodium 137 135 - 145 mmol/L   Potassium 4.1 3.5 - 5.1 mmol/L   Chloride 97 (L) 98 - 111 mmol/L   CO2 34 (H) 22 - 32 mmol/L  Glucose, Bld 99 70 - 99 mg/dL   BUN 46 (H) 8 - 23 mg/dL   Creatinine, Ser 2.95 (H) 0.44 - 1.00 mg/dL   Calcium 8.9 8.9 - 62.1 mg/dL   GFR, Estimated 33 (L) >60 mL/min   Anion gap 6 5 - 15  Magnesium     Status: None   Collection Time: 11/26/22  1:16 AM  Result Value Ref Range   Magnesium 2.4 1.7 - 2.4 mg/dL   VAS [name] LOWER EXTREMITY VENOUS (DVT) (ONLY [facility] & [facility])  Result Date: 11/25/2022  Lower Venous DVT Study Patient Name:  ROSE-MARIE KRONENWETTER  Date of Exam:   11/24/2022 Medical Rec #: [id number]          Accession #:    [id number] Date of Birth: 02/04/1956          Patient Gender: F Patient Age:   55 years Exam Location:  [facility] Procedure:      VAS [name] LOWER EXTREMITY VENOUS (DVT) Referring Phys: MATTHEW TRIFAN --------------------------------------------------------------------------------  Indications: Edema.  Risk Factors: None identified. Limitations: Poor ultrasound/tissue interface. Comparison Study: No prior studies. Performing Technologist: Chanda Busing RVT  Examination Guidelines: A complete evaluation includes B-mode imaging, spectral Doppler, color Doppler, and power Doppler as needed of all accessible portions of each vessel. Bilateral testing is considered an integral part of a complete examination. Limited examinations for reoccurring indications may be performed as noted. The reflux portion of the exam is performed with the patient in reverse  Trendelenburg.  +---------+---------------+---------+-----------+----------+--------------+ RIGHT    CompressibilityPhasicitySpontaneityPropertiesThrombus Aging +---------+---------------+---------+-----------+----------+--------------+ CFV      Full           Yes      Yes                                 +---------+---------------+---------+-----------+----------+--------------+ SFJ      Full                                                        +---------+---------------+---------+-----------+----------+--------------+ FV Prox  Full                                                        +---------+---------------+---------+-----------+----------+--------------+ FV Mid   Full                                                        +---------+---------------+---------+-----------+----------+--------------+ FV DistalFull                                                        +---------+---------------+---------+-----------+----------+--------------+ PFV      Full                                                        +---------+---------------+---------+-----------+----------+--------------+  POP      Full           Yes      Yes                                 +---------+---------------+---------+-----------+----------+--------------+ PTV      Full                                                        +---------+---------------+---------+-----------+----------+--------------+ PERO     Full                                                        +---------+---------------+---------+-----------+----------+--------------+   +---------+---------------+---------+-----------+----------+--------------+ LEFT     CompressibilityPhasicitySpontaneityPropertiesThrombus Aging +---------+---------------+---------+-----------+----------+--------------+ CFV      Full           Yes      Yes                                  +---------+---------------+---------+-----------+----------+--------------+ SFJ      Full                                                        +---------+---------------+---------+-----------+----------+--------------+ FV Prox  Full                                                        +---------+---------------+---------+-----------+----------+--------------+ FV Mid   Full                                                        +---------+---------------+---------+-----------+----------+--------------+ FV Distal               Yes      Yes                                 +---------+---------------+---------+-----------+----------+--------------+ PFV      Full                                                        +---------+---------------+---------+-----------+----------+--------------+ POP      Full           Yes      Yes                                 +---------+---------------+---------+-----------+----------+--------------+  PTV      Full                                                        +---------+---------------+---------+-----------+----------+--------------+ PERO     Full                                                        +---------+---------------+---------+-----------+----------+--------------+     Summary: RIGHT: - There is no evidence of deep vein thrombosis in the lower extremity.  - No cystic structure found in the popliteal fossa.  LEFT: - There is no evidence of deep vein thrombosis in the lower extremity. However, portions of this examination were limited- see technologist comments above.  - No cystic structure found in the popliteal fossa.  *See table(s) above for measurements and observations. Electronically signed by Coral Else MD on 11/25/2022 at 7:17:17 PM.    Final    NM Pulmonary Perfusion  Result Date: 11/25/2022 CLINICAL DATA:  Shortness of breath EXAM: NUCLEAR MEDICINE PERFUSION LUNG SCAN TECHNIQUE: Perfusion images  were obtained in multiple projections after intravenous injection of radiopharmaceutical. Ventilation scans intentionally deferred if perfusion scan and chest x-ray adequate for interpretation during COVID 19 epidemic. RADIOPHARMACEUTICALS:  4.6 mCi Tc-68m MAA IV COMPARISON:  X-ray 11/24/2022 FINDINGS: Focal defect along the left hemithorax consistent with patient's battery pack for defibrillator. Mediastinal shadows enlarged consistent with patient's enlarged heart. Otherwise only a few small subsegmental peripheral defects identified. No larger defects seen on perfusion. IMPRESSION: Very low probability perfusion lung scan Electronically Signed   By: Karen Kays M.D.   On: 11/25/2022 12:43   CT HEAD WO CONTRAST  Result Date: 11/24/2022 CLINICAL DATA:  Syncope/presyncope EXAM: CT HEAD WITHOUT CONTRAST TECHNIQUE: Contiguous axial images were obtained from the base of the skull through the vertex without intravenous contrast. RADIATION DOSE REDUCTION: This exam was performed according to the departmental dose-optimization program which includes automated exposure control, adjustment of the mA and/or kV according to patient size and/or use of iterative reconstruction technique. COMPARISON:  08/16/2019 FINDINGS: Brain: No acute intracranial findings are seen. Ventricles are not dilated. There are no signs of bleeding. There is no focal mass effect. Cortical sulci are prominent. Vascular: Coarse calcifications are seen arterial branches. Skull: No acute findings are seen. Sinuses/Orbits: There is mucosal thickening in the ethmoid sinus, especially in the posterior ethmoid sinus on the right side. Other: There is increased amount of fluid density in sella suggesting possible partial empty sella. IMPRESSION: No acute intracranial findings are seen in noncontrast CT brain. Electronically Signed   By: Ernie Avena M.D.   On: 11/24/2022 13:00   DG Knee 2 Views Right  Result Date: 11/24/2022 CLINICAL DATA:   Fall.  Bilateral anterior knee abrasions. EXAM: RIGHT KNEE - 1-2 VIEW COMPARISON:  None Available. FINDINGS: There is diffuse decreased bone mineralization. Moderate to severe medial compartment joint space narrowing and mild-to-moderate peripheral osteophytosis. Mild peripheral lateral compartment degenerative osteophytosis without joint space narrowing. Moderate patellofemoral joint space narrowing and peripheral osteophytosis. A 5 mm well corticated chronic ossicle is seen inferior to the patella, overlying Hoffa's fat pad on lateral view. Small joint effusion.  No acute fracture or dislocation. IMPRESSION: 1. Moderate-to-severe medial and moderate patellofemoral compartment osteoarthritis. 2. Small joint effusion. Electronically Signed   By: Neita Garnet M.D.   On: 11/24/2022 12:37   DG Knee 2 Views Left  Result Date: 11/24/2022 CLINICAL DATA:  Fall.  Bilateral knee anterior abrasions. EXAM: LEFT KNEE - 1-2 VIEW COMPARISON:  None Available. FINDINGS: There is diffuse decreased bone mineralization. Severe medial compartment joint space narrowing with bone-on-bone contact, subchondral sclerosis, and moderate peripheral osteophytosis. Mild peripheral glenoid degenerative osteophytosis without joint space narrowing. Severe patellofemoral joint space narrowing and peripheral osteophytosis. Well-corticated chronic 15 mm loose body overlying the distal anterior femoral metaphyseal cortex and the suprapatellar joint space. Small joint effusion. Mild-to-moderate chronic enthesopathic change at the quadriceps insertion on the patella. No acute fracture or dislocation. IMPRESSION: 1. Severe medial and patellofemoral compartment osteoarthritis. 2. Small joint effusion. 3. Chronic 15 mm loose body within the suprapatellar joint space. Electronically Signed   By: Neita Garnet M.D.   On: 11/24/2022 12:36   DG Chest Port 1 View  Result Date: 11/24/2022 CLINICAL DATA:  Fall.  Shortness of breath. EXAM: PORTABLE CHEST 1  VIEW COMPARISON:  Chest radiographs 08/16/2019 and 09/04/2015 FINDINGS: Cardiac silhouette is moderately to markedly enlarged, stable to increased from 08/16/2019 most recent prior. Mediastinal contours are within normal limits. Calcification is again seen within the aortic arch. Left chest wall cardiac AICD with leads again overlying the right atrium and right ventricle. Mild-to-moderate bilateral interstitial thickening is mildly improved from 08/16/2019 and similar to 09/04/2015. No definite pleural effusion. No pneumothorax. Old healed bilateral rib fractures are grossly similar to prior. IMPRESSION: 1. Mild-to-moderate bilateral interstitial thickening, mildly improved from 08/16/2019 and similar to 09/04/2015. Findings may represent interstitial scarring. Improved, mild interstitial pulmonary edema is also possible. 2. Moderate to marked cardiomegaly, stable to increased from 08/16/2019. Electronically Signed   By: Neita Garnet M.D.   On: 11/24/2022 12:34    Assessment/Plan: Diagnosis: Debility due to acute on chronic CHF with history of cognitive impairment due to Anoxic brain injury and CVA Does the need for close, 24 hr/day medical supervision in concert with the patient's rehab needs make it unreasonable for this patient to be served in a less intensive setting? Yes Co-Morbidities requiring supervision/potential complications:  -hx of anoxic brain injury and CVA, bipolar disorder, morbid obesity, multiple falls Due to bladder management, bowel management, safety, skin/wound care, disease management, medication administration, pain management, and patient education, does the patient require 24 hr/day rehab nursing? Yes Does the patient require coordinated care of a physician, rehab nurse, therapy disciplines of PT/OT to address physical and functional deficits in the context of the above medical diagnosis(es)? Yes Addressing deficits in the following areas: balance, endurance, locomotion,  strength, transferring, bowel/bladder control, bathing, dressing, feeding, grooming, toileting, cognition, speech, language, swallowing, and psychosocial support Can the patient actively participate in an intensive therapy program of at least 3 hrs of therapy per day at least 5 days per week? Yes The potential for patient to make measurable gains while on inpatient rehab is excellent Anticipated functional outcomes upon discharge from inpatient rehab are modified independent and supervision  with PT, modified independent and supervision with OT, n/a with SLP. Estimated rehab length of stay to reach the above functional goals is: 7-10 Anticipated discharge destination: Home Overall Rehab/Functional Prognosis: good  POST ACUTE RECOMMENDATIONS: This patient's condition is appropriate for continued rehabilitative care in the following setting: SNF Patient has agreed to participate in recommended program.  Yes Note that insurance prior authorization may be required for reimbursement for recommended care.  Comment: Patient would be a good candidate for SNF versus CIR.  Currently she does not have 24/7 assistance at discharge and would likely be a better candidate for SNF unless the situation changes.   I have personally performed a face to face diagnostic evaluation of this patient. Additionally, I have examined the patient's medical record including any pertinent labs and radiographic images. If the physician assistant has documented in this note, I have reviewed and edited or otherwise concur with the physician assistant's documentation.  Thanks,  Fanny Dance, MD 11/26/2022

## 2022-11-26 NOTE — Progress Notes (Signed)
Initial Nutrition Assessment  DOCUMENTATION CODES:   Morbid obesity  INTERVENTION:  Liberalize to a 2g sodium diet "Heart Failure Nutrition Therapy" and "General, Healthful Nutrition Therapy" handout added to AVS  Referral placed for ongoing outpatient nutrition education  NUTRITION DIAGNOSIS:   Increased nutrient needs related to acute illness as evidenced by estimated needs  GOAL:   Patient will meet greater than or equal to 90% of their needs  MONITOR:   PO intake, Supplement acceptance, Labs, Weight trends, I & O's  REASON FOR ASSESSMENT:   Consult Assessment of nutrition requirement/status  ASSESSMENT:   Pt admitted from home with c/o multiple falls, admitted with heart failure exacerbation. 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  CXR findings of cardiomegaly pulmonary vascular congestions. Continues with diuresis. Debility/falls likely related to medications, adjusting during admission.   Plans for SNF placement following admission.   Pt sitting up in the chair at time of visit. She is in good spirits and lunch had been delivered at that time.   She reports eating well with no significant changes to her appetite and states "I can eat" however she did not provide much detail. She states that she usually likes to cook but d/t her tremors she has had difficulty. She recalls eating pizza and chinese food but reports that she needs to be careful of the sodium. She had many questions about nutrition and ways to eat healthier for heart health and preventing diabetes. All questions answered. Informed pt that she will be provided with nutrition education handouts in her AVS and a referral will be placed for ongoing nutrition education as an outpatient.  Meal completions: 5/21: 100% lunch, 100% dinner  Reviewed weight history. Pt's dry weight is unknown. Noted pt's weight to have  declined from 124.3 kg on 11/29/21 and is now maintained between 115-117 kg within the last 3 months.   Medications: pepcid, lasix 60mg  BID  Labs: BUN 46, Cr 1.67, GFR 33  NUTRITION - FOCUSED PHYSICAL EXAM:  Flowsheet Row Most Recent Value  Orbital Region No depletion  Upper Arm Region No depletion  Thoracic and Lumbar Region No depletion  Buccal Region No depletion  Temple Region No depletion  Clavicle Bone Region No depletion  Clavicle and Acromion Bone Region No depletion  Scapular Bone Region No depletion  Dorsal Hand No depletion  Patellar Region No depletion  Anterior Thigh Region No depletion  Posterior Calf Region No depletion  Edema (RD Assessment) Moderate  [BLE]  Hair Reviewed  Eyes Reviewed  Mouth Reviewed  Skin Reviewed  Nails Reviewed       Diet Order:   Diet Order             Diet heart healthy/carb modified Room service appropriate? Yes; Fluid consistency: Thin  Diet effective now                   EDUCATION NEEDS:   Education needs have been addressed  Skin:  Skin Assessment: Reviewed RN Assessment  Last BM:  5/19  Height:   Ht Readings from Last 1 Encounters:  11/25/22 5\' 2"  (1.575 m)    Weight:   Wt Readings from Last 1 Encounters:  11/26/22 117.9 kg    Ideal Body Weight:  50 kg  BMI:  Body mass index is 47.54 kg/m.  Estimated Nutritional Needs:   Kcal:  1400-1600  Protein:  70-85g  Fluid:  1.4-1.6L  Drusilla Kanner, RDN, LDN Clinical Nutrition

## 2022-11-26 NOTE — Plan of Care (Signed)
  Problem: Education: Goal: Knowledge of General Education information will improve Description: Including pain rating scale, medication(s)/side effects and non-pharmacologic comfort measures Outcome: Progressing   Problem: Clinical Measurements: Goal: Respiratory complications will improve Outcome: Progressing   Problem: Activity: Goal: Risk for activity intolerance will decrease Outcome: Progressing   

## 2022-11-26 NOTE — Plan of Care (Signed)

## 2022-11-26 NOTE — Evaluation (Signed)
Occupational Therapy Evaluation Patient Details Name: Marie Ramos MRN: [id number] DOB: 1955/11/19 Today's Date: 11/26/2022   History of Present Illness Pt is 67 yo female admitted on 11/24/22 with acute on chronic CHF, weakness, and difficulty with ambulation. Pt with hx including but not limited to cardiac arrest, anoxic brain injury due to cardiac arrest, CKD, heart failure, bipolar and obesity, CVA   Clinical Impression   PTA, pt lived with husband who assisted with IADL. Pt and husband report multiple recent falls as well as decreased memory as compared to baseline. Husband further elaborates decr knowledge regarding management of health status and diet. Upon eval, pt performing UB Adl with set-up and LB ADL with up to mod A.  Pt with generalized weakness, decr memory, balance, safety awareness. Will follow acutely and highly recommending continued OT services >3hours/day to optimize safety and independence in ADL.      Recommendations for follow up therapy are one component of a multi-disciplinary discharge planning process, led by the attending physician.  Recommendations may be updated based on patient status, additional functional criteria and insurance authorization.   Assistance Recommended at Discharge Intermittent Supervision/Assistance  Patient can return home with the following A little help with walking and/or transfers;A little help with bathing/dressing/bathroom;Help with stairs or ramp for entrance;Assist for transportation;Assistance with feeding;Direct supervision/assist for medications management;Direct supervision/assist for financial management;Assistance with cooking/housework    Functional Status Assessment  Patient has had a recent decline in their functional status and demonstrates the ability to make significant improvements in function in a reasonable and predictable amount of time.  Equipment Recommendations  Other (comment) (defer)    Recommendations for  Other Services Rehab consult     Precautions / Restrictions Precautions Precautions: Fall      Mobility Bed Mobility               General bed mobility comments: in chair    Transfers Overall transfer level: Needs assistance Equipment used: Rolling walker (2 wheels) Transfers: Sit to/from Stand Sit to Stand: Min assist           General transfer comment: Cues for hand placement with min A to rise; required repeated cues; performed x 3      Balance Overall balance assessment: Needs assistance, History of Falls Sitting-balance support: No upper extremity supported Sitting balance-Leahy Scale: Good Sitting balance - Comments: Overall good sitting balance statically   Standing balance support: Bilateral upper extremity supported Standing balance-Leahy Scale: Poor Standing balance comment: reliant on RW and up to min external support                           ADL either performed or assessed with clinical judgement   ADL Overall ADL's : Needs assistance/impaired Eating/Feeding: Sitting;Modified independent   Grooming: Minimal assistance;Standing   Upper Body Bathing: Set up;Sitting   Lower Body Bathing: Moderate assistance;Sit to/from stand   Upper Body Dressing : Set up;Sitting   Lower Body Dressing: Moderate assistance;Sit to/from stand Lower Body Dressing Details (indicate cue type and reason): Unable to reach feet this session; mod A to manage underpants Toilet Transfer: Minimal assistance;Rolling walker (2 wheels);Stand-pivot;Ambulation;BSC/3in1   Toileting- Architect and Hygiene: Moderate assistance;Sit to/from stand Toileting - Clothing Manipulation Details (indicate cue type and reason): Able to intiate but unable to reach all portions of perineal area at time of eval     Functional mobility during ADLs: Minimal assistance;Rolling walker (2 wheels)  Vision Baseline Vision/History: 1 Wears glasses Ability to See in  Adequate Light: 0 Adequate Patient Visual Report: No change from baseline Vision Assessment?: No apparent visual deficits Additional Comments: Pt denying new visual changes. Recognizing OT smiling on entry at doorway     Perception     Praxis      Pertinent Vitals/Pain Pain Assessment Pain Assessment: Faces Faces Pain Scale: Hurts a little bit Pain Location: generalized soreness; RLE Pain Descriptors / Indicators: Sore Pain Intervention(s): Limited activity within patient's tolerance, Monitored during session     Hand Dominance Right   Extremity/Trunk Assessment Upper Extremity Assessment Upper Extremity Assessment: Generalized weakness (BUE tremor with fatigue)   Lower Extremity Assessment Lower Extremity Assessment: Defer to PT evaluation   Cervical / Trunk Assessment Cervical / Trunk Assessment: Kyphotic;Other exceptions Cervical / Trunk Exceptions: obesity; pt with tremors (reports from meds)   Communication Communication Communication: No difficulties   Cognition Arousal/Alertness: Awake/alert Behavior During Therapy: WFL for tasks assessed/performed Overall Cognitive Status: Impaired/Different from baseline Area of Impairment: Memory, Following commands, Safety/judgement                     Memory: Decreased short-term memory Following Commands: Follows one step commands consistently, Follows one step commands with increased time, Follows multi-step commands inconsistently Safety/Judgement: Decreased awareness of safety     General Comments: Pt with poor delayed memory recall; husband reports recently she has been having to have assist recalling code to get into her phone which is her birthday, although she knows what her birthday is. Able to follow one step commands, but is challenged by multistep commands. Cues for safety and technique during transfers     General Comments  Pt husband reports pt is in need of lifestyle changes; could likely benefit from  CHF education    Exercises     Shoulder Instructions      Home Living Family/patient expects to be discharged to:: Private residence Living Arrangements: Spouse/significant other Available Help at Discharge: Family;Available PRN/intermittently (spouse works part time per chart) Type of Home: Apartment Home Access: Elevator     Home Layout: One level (decent walk to elevator to enter)     Bathroom Shower/Tub: Chief Strategy Officer: Handicapped height     Home Equipment: Grab bars - tub/shower;Rolling Environmental consultant (2 wheels);Cane - single point;Wheelchair - manual          Prior Functioning/Environment Prior Level of Function : Needs assist             Mobility Comments: Fearful of falls; Will only ambulate short community distances ; Was using a cane - just got a cane; Has had 6-8 falls past 2 months (reports some falls prior just not as frequent) ADLs Comments: Pt independent with ADLs and cooking; Spouse assist with IADLs of laundry, grocery shopping, setting up pillbox for medication management; pt did still occasionally substitute teach        OT Problem List: Decreased strength;Decreased activity tolerance;Impaired balance (sitting and/or standing);Decreased cognition;Decreased safety awareness;Decreased knowledge of use of DME or AE      OT Treatment/Interventions: Self-care/ADL training;Therapeutic exercise;DME and/or AE instruction;Balance training;Patient/family education;Therapeutic activities;Cognitive remediation/compensation    OT Goals(Current goals can be found in the care plan section) Acute Rehab OT Goals Patient Stated Goal: decr falls OT Goal Formulation: With patient Time For Goal Achievement: 12/10/22 Potential to Achieve Goals: Good  OT Frequency: Min 2X/week    Co-evaluation  AM-PAC OT "6 Clicks" Daily Activity     Outcome Measure Help from another person eating meals?: None Help from another person taking care of  personal grooming?: A Little Help from another person toileting, which includes using toliet, bedpan, or urinal?: A Little Help from another person bathing (including washing, rinsing, drying)?: A Little Help from another person to put on and taking off regular upper body clothing?: A Little Help from another person to put on and taking off regular lower body clothing?: A Little 6 Click Score: 19   End of Session Equipment Utilized During Treatment: Gait belt;Rolling walker (2 wheels) Nurse Communication: Mobility status (no chair alarm on arrival)  Activity Tolerance: Patient tolerated treatment well Patient left: in chair;with call bell/phone within reach  OT Visit Diagnosis: Unsteadiness on feet (R26.81);Muscle weakness (generalized) (M62.81);Other abnormalities of gait and mobility (R26.89);Repeated falls (R29.6);History of falling (Z91.81);Other symptoms and signs involving cognitive function                Time: 1046-1110 OT Time Calculation (min): 24 min Charges:  OT General Charges $OT Visit: 1 Visit OT Evaluation $OT Eval Moderate Complexity: 1 Mod OT Treatments $Self Care/Home Management : 8-22 mins  Tyler Deis, OTR/L [facility] Acute Rehabilitation Office: [phone]   Myrla Halsted 11/26/2022, 11:40 AM

## 2022-11-26 NOTE — Progress Notes (Signed)
Inpatient Rehab Admissions Coordinator:    I met with Pt. And spoke with her husband over the phone to discuss potential CIR admit. She is interested but her husband states he cannot provide 24/7 assist at home, as he works and cannot provide physical assist at d/c. Discussed SNF instead and they are amenable. I will tet TOC know and CIR will sign off at this time.   Megan Salon, MS, CCC-SLP Rehab Admissions Coordinator  [phone] (celll) [phone] (office)

## 2022-11-26 NOTE — Progress Notes (Signed)
Patient unable to tolerate CPAP at this time. Restless and moving around in bed, CPAP leaking and coming disconnected.

## 2022-11-26 NOTE — Discharge Instructions (Addendum)
Heart Failure Nutrition Therapy  This nutrition therapy will help you feel better and support your heart.  This plan focuses on: Limiting sodium in your diet. Salt (sodium) makes your body hold water. When your body holds too much water, you can feel shortness of breath and swelling. You can prevent these symptoms by eating less salt. Limiting fluid in your diet. For some patients, drinking too much fluid can make heart failure worse. It can cause symptoms such as shortness of breath and swelling. Limiting fluids can help relieve some of your symptoms. Managing your weight. Your registered dietitian nutritionist (RDN) can help you choose a healthy weight for your body type. You can achieve these goals by: Reading food labels to keep track of how much sodium is in the foods you eat. Limiting foods that are high in sodium. Checking your weight to make sure you're not retaining too much fluid.  Reading the Food Label: How Much Sodium Is Too Much? The nutrition plan for heart failure usually limits the sodium you get from food and drinks to 2,000 milligrams per day. Salt is the main source of sodium. Read the nutrition label to find out how much sodium is in 1 serving of a food. Select foods with 140 milligrams of sodium or less per serving. Foods with more than 300 milligrams of sodium per serving may not fit into a reduced-sodium meal plan. Check serving sizes. If you eat more than 1 serving, you will get more sodium than the amount listed.  Cutting Back on Sodium Avoid processed foods. Eat more fresh foods. Fresh and frozen fruits and vegetables without added juices or sauces are naturally low in sodium. Fresh meats are lower in sodium than processed meats, such as bacon, sausage, and hot dogs. Read the nutrition label or ask your butcher to help you find a fresh meat that is low in sodium. Eat less salt, at the table and when cooking. Just 1 teaspoon of table salt has 2,300 milligrams of  sodium. Leave the salt out of recipes for pasta, casseroles, and soups. Ask your RDN how to cook your favorite recipes without sodium. Be a Engineer, building services. Look for food packages that say "salt-free" or "sodium-free." These items contain less than 5 milligrams of sodium per serving. "Very-low-sodium" products contain less than 35 milligrams of sodium per serving. "Low-sodium" products contain less than 140 milligrams of sodium per serving. "Unsalted" or "no added salt" products may still be high in sodium. Check the nutrition label. Add flavors to your food without adding sodium. Try lemon juice, lime juice, fruit juice, or vinegar. Dry or fresh herbs add flavor. Try basil, bay leaf, dill, rosemary, parsley, sage, dry mustard, nutmeg, thyme, and paprika. Pepper, red pepper flakes, and cayenne pepper can add spice to your meals without adding sodium. Hot sauce contains sodium, but if you use just a drop or two, it will not add up to much. Buy a sodium-free seasoning blend or make your own at home. Use caution when you eat outside your home. Restaurant foods can be very high in sodium. Ask for nutrition information. Many restaurants provide nutrition facts on their menus or websites. Let your server know that you want your food to be cooked without salt. Ask for your salad dressing and sauces to come "on the side."  Fluid Restriction Your doctor may ask you to follow a fluid restriction in addition to taking diuretics (water pills). Ask your doctor how much fluid you can have. Foods that are  liquid at room temperature are considered a fluid, such as popsicles, soup, ice cream, and Jell-O. Here are some common conversions that will help you measure your fluid intake every day: 1,000 milliliters = 1 liter or 4 cups 1 fluid ounce = 30 milliliters  1 cup = 240 milliliters 2,000 milliliters = 2 liters or 8 cups  1,500 milliliters = 1 liters or 6 cups     Weight Monitoring Weigh yourself each day.  Sudden weight gain is a sign that fluid is building up in your body. Follow these guidelines: Weigh yourself every morning. If you gain 3 or more pounds in 1-2 days or 5 or more pounds within 1 week, call your doctor. Your doctor may adjust your medicine to get rid of the extra fluid. Talk with your doctor or RDN about what a healthy weight is for you. Talk with your doctor to find out what type of physical activity is best for you.  Foods Recommended Food Group Recommended Foods  Grains Bread with less than 80 milligrams sodium per slice (yeast breads usually have less sodium than those made with baking soda) Homemade bread made with reduced-sodium baking soda Many cold cereals, especially shredded wheat and puffed rice Oats, grits, or cream of wheat Dry pastas, noodles, quinoa, and rice  Vegetables Fresh and frozen vegetables without added sauces, salt, or sodium Homemade soups (salt free or low sodium) Low-sodium or sodium-free canned vegetables and soups  Fruits Fresh and canned fruits Dried fruits, such as raisins, cranberries, and prunes  Dairy (Milk and Milk Products) Milk or milk powder Rice milk and soy milk Yogurt, including Greek yogurt Small amounts of natural, block cheese or reduced-sodium cheese (Swiss, ricotta, and fresh mozzarella are lower in sodium than others) Regular or soft cream cheese and low-sodium cottage cheese  Protein Foods (Meat, Poultry, Fish, Armed forces logistics/support/administrative officer) [employer] and fish [place] bacon (except if packaged in a sodium solution) Canned or packed tuna (no more than 4 ounces at 1 serving) Dried beans and peas; edamame (fresh soybeans) Eggs or egg beaters (if  less than 200 mg per serving) Unsalted nuts or peanut butter  Desserts and Snacks Fresh fruit or applesauce Angel food cake Granola bars Unsalted pretzels, popcorn, or nuts Pudding or gelatin with whipped cream topping Homemade rice-crispy treats Vanilla wafers Frozen fruit bars  Fats Tub or liquid  margarine Unsaturated fat oils (canola, olive, corn, sunflower, safflower, peanut)  Condiments Fresh or dried herbs; low-sodium ketchup; vinegar; lemon or lime juice; pepper; salt-free seasoning mixes and marinades (salt-free seasoning blend); simple salad dressings (vinegar and oil); salt-free sauces   Foods Not Recommended Food Group Foods Not Recommended  Grains Breads or crackers topped with salt Cereals (hot/cold) with more than 300 milligrams sodium per serving Biscuits, cornbread, and other "quick" breads prepared with baking soda Prepackaged bread crumbs Self-rising flours  Vegetables Canned vegetables (unless they are salt free or low sodium) Frozen vegetables with seasoning and sauces Sauerkraut and pickled vegetables Canned or dried soups (unless they are salt free or low  sodium) [place] fries and onion rings  Fruits Dried fruits preserved with sodium-containing additives  Dairy (Milk and Milk Products) Buttermilk Processed cheeses  Cottage cheese (unless a low-sodium variety) Feta cheese; shredded cheese (has more sodium than block cheese); "singles" slices and string cheese  Protein Foods (Meat, Poultry, Fish, Beans) Cured meats: bacon, ham, sausage, pepperoni, and hot dogs Canned meats: chili, Vienna sausage, sardines, and ham Smoked fish and meats Frozen meals that have more  than 600 milligrams sodium  Fats Salted butter or margarine  Condiments Salt, sea salt, kosher salt, onion salt, and garlic salt Seasoning mixes containing salt (Lemon Pepper or Bouillon cubes) Catsup or ketchup, BBQ sauce, Worcestershire and soy sauce Salsa, pickles, olives, relish Salad dressings: ranch, blue cheese, [place], and [place]   Alcohol Check with your doctor.   Heart Failure Sample 1-Day Menu View Nutrient Info Breakfast 1 cup regular oatmeal made with water or milk 1 cup reduced-fat (2%) milk 1 medium banana 1 slice whole wheat bread 1 tablespoon salt-free peanut butter  Morning  Snack 1/2 cup dried cranberries  Lunch 3 ounces grilled chicken breast 1 cup salad greens Olive oil and vinegar dressing (for greens) 5 unsalted or low-sodium crackers Fruit plate with 1/4 cup strawberries 1/2 sliced orange (for fruit plate) 1 peach half (for fruit plate)  Afternoon Snack 1 ounce low-sodium [place] 1 piece whole wheat bread  Evening Meal 3 ounces herb-baked fish 1 baked potato 2 teaspoons soft margarine (trans fat-free) (for potato) Sliced tomatoes 1/2 cup steamed spinach drizzled with lemon juice 3-inch square of angel food cake Fresh strawberries (2) (for cake)  Evening Snack 2 tablespoons salt-free peanut butter 5 low-sodium crackers  Daily Sum Nutrient Unit Value  Macronutrients  Energy kcal 1890  Energy kJ 7906  Protein g 95  Total lipid (fat) g 56  Carbohydrate, by difference g 270  Fiber, total dietary g 31  Sugars, total g 99  Minerals  Calcium, Ca mg 949  Iron, Fe mg 27  Sodium, Na mg 1538  Vitamins  Vitamin C, total ascorbic acid mg 118  Vitamin A, IU IU 18639  Vitamin D IU 232  Lipids  Fatty acids, total saturated g 13  Fatty acids, total monounsaturated g 22  Fatty acids, total polyunsaturated g 16  Cholesterol mg 126     Heart Failure Vegan Sample 1-Day Menu View Nutrient Info Breakfast 1 cup oatmeal  cup walnuts 1 banana 1 cup soymilk fortified with calcium, vitamin B12, and vitamin D  Lunch 1 large whole wheat pita Salad made with: 1 cup chickpeas 1 cup lettuce  cup cherry tomatoes 1 cup strawberries 1 tablespoon olive oil 1 tablespoon balsamic vinegar  Evening Meal  cup tofu 2 teaspoons olive oil Pinch garlic powder 1 baked potato 1 tablespoon margarine, soft, tub  cup cooked spinach with: Squeeze of lemon 1 cup soymilk fortified with calcium, vitamin B12, and vitamin D  Evening Snack 1 tablespoon peanut butter, without salt  ounce pretzels, without salt  Daily Sum Nutrient Unit Value  Macronutrients  Energy  kcal 1848  Energy kJ 7735  Protein g 74  Total lipid (fat) g 83  Carbohydrate, by difference g 223  Fiber, total dietary g 39  Sugars, total g 44  Minerals  Calcium, Ca mg 1325  Iron, Fe mg 20  Sodium, Na mg 1098  Vitamins  Vitamin C, total ascorbic acid mg 140  Vitamin A, IU IU 15807  Vitamin D IU 238  Lipids  Fatty acids, total saturated g 13  Fatty acids, total monounsaturated g 33  Fatty acids, total polyunsaturated g 32  Cholesterol mg 0     Heart Failure Vegetarian (Lacto-Ovo) Sample 1-Day Menu View Nutrient Info Breakfast 1 cup oatmeal  cup walnuts 1 banana 1 cup fat-free milk  Lunch 1 large whole wheat pita Salad made with:  cup chickpeas 1 ounce mozzarella cheese 1 cup lettuce  cup cherry tomatoes 1 cup strawberries 1 tablespoon olive  oil 1 tablespoon balsamic vinegar  Evening Meal  cup tofu 2 teaspoons olive oil Pinch garlic powder 1 baked potato 1 tablespoon margarine, soft, tub  cup cooked spinach with: Squeeze of lemon 1 cup fat-free milk  Evening Snack 1 tablespoon peanut butter, without salt 1 apple  Daily Sum Nutrient Unit Value  Macronutrients  Energy kcal 1847  Energy kJ 7734  Protein g 76  Total lipid (fat) g 79  Carbohydrate, by difference g 231  Fiber, total dietary g 35  Sugars, total g 83  Minerals  Calcium, Ca mg 1488  Iron, Fe mg 16  Sodium, Na mg 1100  Vitamins  Vitamin C, total ascorbic acid mg 149  Vitamin A, IU IU 16116  Vitamin D IU 234  Lipids  Fatty acids, total saturated g 15  Fatty acids, total monounsaturated g 32  Fatty acids, total polyunsaturated g 26  Cholesterol mg 28    Copyright 2020  [employer]. All rights reserved       General, Healthful Nutrition Therapy  This handout provides you with the information you'll need to follow a general, healthful diet, which can be tailored to your personal preferences. There are several benefits to following a general, healthful  diet: It could mean less calories, less salt, less added sugars, and less saturated fat than many other diets. This outcome will depend on the foods you choose. Eating more whole grains, beans, lentils, fruits, vegetables, nuts, and seeds may improve how much fiber, vitamins, and minerals you eat.  It can lower your risk for health conditions like diabetes, heart disease, hypertension, stroke, and cancer. Your registered dietitian nutritionist (RDN) may recommend portion sizes based on your individual needs and personal and cultural preferences.  Tips Every day, eat a variety of fruits and vegetables in a variety of colors. Be sure to include lots of dark green, red, blue-purple, and orange vegetables. Choose whole grains for at least half of your grain selections. Eat more beans, peas, and lentils. Try meatless alternatives. Get protein in your diet from eggs, fish, poultry, beans, peas, lentils, and nuts/nut butters. Low-fat or fat-free dairy products are also good sources of protein. Keep your salt intake to a minimum (less than 2300 milligrams per day). Limit use of salt, soy sauce, or fish sauce when cooking. Eat freshly prepared meals at home. Processed, prepackaged, and restaurant foods contain more salt. Choose fresh fruits and vegetables for snacks. Choose products with lower sodium content when grocery shopping. Limit your daily sugar intake. Sugar may be used in sauces, marinades, dressings, and condiments - even those that do not taste sweet.  Sugar can be found in honey, syrups, jelly, fruit juice, and fruit juice concentrate. Limit sugar-sweetened beverages like sodas and fruit juice, sugary snacks, and candy. It's best to choose products without added sugar, but if you do eat them, read labels carefully so you know how much sugar is in each portion. It is better to eat unsaturated fats than saturated fats. Use fats and oils in moderation, up to 5 servings per day. Unsaturated  fat is found in fish, avocado, nuts, and oils like sunflower, canola, avocado and olive oils. Saturated fat is found in fatty meat, butter, ice cream, palm and coconut oil, cream, cheese, and lard. Many processed foods, fried foods, fast food items, convenience foods like frozen pizza and snack foods, and sweets including pies, cookies, and other pastries are high in fat. Check nutrition labels and choose these foods less  often. Use vegetable oil instead of lard or butter for cooking. Boil, steam, or bake your food instead of deep frying in oil. Remove the fatty part of meats before cooking.  Foods to Choose or to Limit Choose a healthful balance of foods from each food group at your meals. Your RDN may make individualized portion size recommendations based on your needs. Food Group Foods to Choose Foods to Limit  Grains Whole wheat, barley, rye, buckwheat, corn, teff, quinoa, millet, amaranth, brown and wild rice, sorghum, and oats; focus on intact cooked whole grains Grain products, such as bread, rolls, prepared breakfast cereals, crackers, and pasta made from whole grains that are low in added sugars, saturated fat, and sodium Sweetened, low-fiber breakfast cereals Packaged (high sugar, refined ingredients) baked goods Snack crackers and chips made of refined ingredients, cheese crackers, butter crackers Breads made with refined ingredients and saturated fats, such as biscuits, frozen waffles, sweet breads, doughnuts, pastries, packaged baking mixes, pancakes, cakes, and cookies  Protein Foods Meat, including lean, trimmed cuts of beef, pork, or lamb a few times per week or less Poultry, including skinless chicken or [place] Seafood, including fish, shrimp, lobster, clams, and scallops at least twice per week. Focus on fatty fish, such as salmon, herring, and sardines, as a rich source of omega-3 fatty acids Eggs Nuts and seeds, such as peanuts, almonds, pistachios, and sunflower seeds  (unsalted varieties) Nut and seed butters, such as peanut butter, almond butter, and sunflower seed butter (reduced-sodium varieties)  Soy foods such as tofu, tempeh, or soy nuts Plant protein-based meat alternatives, such as veggie burgers and sausages (reduced-sodium varieties) Unsalted beans, lentils, or peas at least a few times per week in place of other protein sources Marbled or fatty red meats (beef, pork, lamb), such as ribs Processed red meats, such as bacon, sausage, and ham Poultry (chicken and [place]) with skin Fried meats, poultry, or fish [employer], shark, and [place] (may contain high levels of mercury) Deli meats, such as pastrami, bologna, or salami Fried eggs Salted beans, peas, lentils, nuts, seeds, or nut/seed butters Meat alternatives with high levels of sodium or saturated fat  Dairy and Dairy Alternatives Low-fat or fat-free milk, yogurt (low in added sugars), cottage cheese, and cheeses Fortified soymilk or soy yogurt Whole milk, cream, cheeses made from whole milk, sour cream Yogurt or ice cream made from whole milk or with added sugar Cream cheese made from whole milk  Vegetables A variety of vegetables, including dark-green, red, blue-purple, and orange vegetables Low-sodium vegetable juices Canned or frozen vegetables with salt, fresh vegetables prepared with salt Fried vegetables Vegetables in cream sauce or cheese sauce Tomato or pasta sauce with high levels of salt or sugar  Fruit A variety of whole fruits, canned fruit packed in water, or dried fruit 100% fruit juice (limited to  cup per day) Fruits packed in syrup or made with added sugar  Fats and Oils Unsaturated vegetable oils, including olive, peanut, and canola oils Vegetable oil-based margarines and spreads Salad dressing and mayonnaise made from unsaturated vegetable oils Solid shortening or partially hydrogenated oils Solid margarine made with hydrogenated or partially hydrogenated  oils Butter  Beverages Coffee and tea (unsweetened) Water Sweetened drinks, including sweetened coffee or tea drinks, soda, energy drinks, and sports drinks  Other Prepared foods, including soups, casseroles, salads, baked goods, and snacks made from recommended ingredients, with low levels of added saturated fat, added sugars, or salt Sugary and/or fatty desserts, candy, and other sweets; salt  and seasonings that contain salt Fried foods   General, Healthful Diet Sample 1-Day Menu View Nutrient Info Breakfast 1 cup oatmeal  cup blueberries 1 ounce almonds 1 cup 1% milk or fortified soymilk 1 cup unsweetened coffee  Lunch 2 slices whole wheat bread 3 ounces [place] slices 2 lettuce leaves 2 slices tomato 1 ounce reduced-fat, reduced sodium cheese  cup carrot sticks  cup hummus 1 banana 1 cup 1% milk or fortified soymilk 1 cup unsweetened tea  Evening Meal 4 ounces salmon, baked  cup cooked brown rice 1 cup green beans, cooked 1 cup mixed greens salad 1 teaspoon olive oil mixed with vinegar of choice 1 whole wheat dinner roll 1 teaspoon margarine, soft, tub (for roll) 1 cup water  Evening Snack 1 cup low-fat yogurt  cup sliced peaches

## 2022-11-27 DIAGNOSIS — I5033 Acute on chronic diastolic (congestive) heart failure: Secondary | ICD-10-CM | POA: Diagnosis not present

## 2022-11-27 LAB — CBC
HCT: 34.4 % — ABNORMAL LOW (ref 36.0–46.0)
Hemoglobin: 10.6 g/dL — ABNORMAL LOW (ref 12.0–15.0)
MCH: 31.5 pg (ref 26.0–34.0)
MCHC: 30.8 g/dL (ref 30.0–36.0)
MCV: 102.4 fL — ABNORMAL HIGH (ref 80.0–100.0)
Platelets: 439 10*3/uL — ABNORMAL HIGH (ref 150–400)
RBC: 3.36 MIL/uL — ABNORMAL LOW (ref 3.87–5.11)
RDW: 14.8 % (ref 11.5–15.5)
WBC: 9.7 10*3/uL (ref 4.0–10.5)
nRBC: 0.3 % — ABNORMAL HIGH (ref 0.0–0.2)

## 2022-11-27 LAB — BASIC METABOLIC PANEL
Anion gap: 9 (ref 5–15)
BUN: 46 mg/dL — ABNORMAL HIGH (ref 8–23)
CO2: 35 mmol/L — ABNORMAL HIGH (ref 22–32)
Calcium: 9 mg/dL (ref 8.9–10.3)
Chloride: 92 mmol/L — ABNORMAL LOW (ref 98–111)
Creatinine, Ser: 1.72 mg/dL — ABNORMAL HIGH (ref 0.44–1.00)
GFR, Estimated: 32 mL/min — ABNORMAL LOW (ref >60)
Glucose, Bld: 90 mg/dL (ref 70–99)
Potassium: 4.1 mmol/L (ref 3.5–5.1)
Sodium: 136 mmol/L (ref 135–145)

## 2022-11-27 NOTE — Progress Notes (Signed)
PROGRESS NOTE    Marie Ramos  MVH:[id number] DOB: 10-02-1955 DOA: 11/24/2022 PCP: Daisy Floro, MD  67/F with history of cardiac arrest, anoxic brain injury, CKD, diastolic CHF, bipolar disorder and obesity presented to the ED after mechanical fall.  History of frequent falls over the last 6 months, last fall 3 days PTA, limited mobility since then, prior to admission furosemide dose was increased due to volume overload however continues to be weak and deconditioned. -Vital signs stable, sodium 138, BUN 46, creatinine 1.7, mag 2.9, troponin 47, hemoglobin 10.3, D-dimer 1.9 -Head CT negative for acute changes.  -Chest x-ray noted cardiomegaly pulmonary vascular congestion  -Admitted, started on IV Lasix    Subjective: -Feels better overall, breathing improving, more alert as well  Assessment and Plan:  Acute on chronic diastolic CHF Acute hypoxic respiratory failure -Echo with EF 55%, severe hypertrophy of the septal segment.  No outflow obstruction, RV systolic function is preserved.  -Sp ICD due to cardiac arrest.  -Diuresing on IV Lasix urine output inaccurate, at least 2.2 L negative  -Continue IV Lasix 1 more day, carvedilol and Jardiance  -Wean O2 as tolerated -PT OT following, declined by CIR, SNF recommended  Acute kidney injury superimposed on chronic kidney disease (HCC) CKD stage 3b.  -hold ARB, continue lasix  Debility/freq falls -PT/OT> CIR eval -High-dose ox carbamazepine likely contributing to frequent falls, tremors and unsteady gait, will decrease dose  Anoxic brain damage (HCC) History of CVA with cognitive impairment. Continue aspirin and statin therapy.    Bipolar affective disorder in remission (HCC) Continue venlafaxine, oxcarbazepine, dose decreased  Class 3 obesity (HCC) Calculated BMI is 47.6    DVT prophylaxis: lovenox Code Status: Full code Family Communication: Spouse at bedside Disposition Plan: SNF tomorrow if  stable  Consultants:    Procedures:   Antimicrobials:    Objective: Vitals:   11/26/22 1538 11/26/22 1947 11/27/22 0047 11/27/22 0724  BP: (!) 105/54 119/73 124/69 (!) 156/88  Pulse: 70 69 66 71  Resp: 16 16 16 18   Temp: 98.6 F (37 C) 99.1 F (37.3 C) 99 F (37.2 C) 97.9 F (36.6 C)  TempSrc: Oral Oral Axillary Oral  SpO2: 92% 91% 94% 91%  Weight:      Height:        Intake/Output Summary (Last 24 hours) at 11/27/2022 1203 Last data filed at 11/27/2022 0900 Gross per 24 hour  Intake 720 ml  Output 2250 ml  Net -1530 ml   Filed Weights   11/24/22 2010 11/25/22 0923 11/26/22 0408  Weight: 112.5 kg 118.2 kg 117.9 kg    Examination:  Gen: Awake, Alert, Oriented X 3,  HEENT: no JVD Lungs: Good air movement bilaterally, CTAB CVS: S1S2/RRR Abd: soft, Non tender, non distended, BS present Extremities: Trace edema Skin: no new rashes on exposed skin    Data Reviewed:   CBC: Recent Labs  Lab 11/24/22 1207 11/24/22 1219 11/25/22 0135 11/25/22 0505 11/27/22 0102  WBC 9.9  --   --   --  9.7  NEUTROABS 7.8*  --   --   --   --   HGB 10.3* 11.2*  11.2* 11.9* 12.2 10.6*  HCT 35.0* 33.0*  33.0* 35.0* 36.0 34.4*  MCV 106.7*  --   --   --  102.4*  PLT 451*  --   --   --  439*   Basic Metabolic Panel: Recent Labs  Lab 11/24/22 1207 11/24/22 1219 11/25/22 0126 11/25/22 0135 11/25/22 0505 11/26/22  0116 11/27/22 0102  NA 138 138  139 144 143 142 137 136  K 4.9 4.9  4.9 4.3 4.4 4.6 4.1 4.1  CL 101 104 101  --   --  97* 92*  CO2 28  --  33*  --   --  34* 35*  GLUCOSE 72 74 117*  --   --  99 90  BUN 46* 49* 45*  --   --  46* 46*  CREATININE 1.71* 1.70* 1.84*  --   --  1.67* 1.72*  CALCIUM 8.9  --  9.2  --   --  8.9 9.0  MG 2.9*  --   --   --   --  2.4  --    GFR: Estimated Creatinine Clearance: 38.7 mL/min (A) (by C-G formula based on SCr of 1.72 mg/dL (H)). Liver Function Tests: Recent Labs  Lab 11/24/22 1207 11/25/22 0126  AST 23 50*  ALT 24  49*  ALKPHOS 95 107  BILITOT 0.5 0.3  PROT 6.4* 6.9  ALBUMIN 2.8* 2.9*   No results for input(s): "LIPASE", "AMYLASE" in the last 168 hours. No results for input(s): "AMMONIA" in the last 168 hours. Coagulation Profile: No results for input(s): "INR", "PROTIME" in the last 168 hours. Cardiac Enzymes: No results for input(s): "CKTOTAL", "CKMB", "CKMBINDEX", "TROPONINI" in the last 168 hours. BNP (last 3 results) No results for input(s): "PROBNP" in the last 8760 hours. HbA1C: Recent Labs    11/25/22 0126  HGBA1C 6.2*   CBG: No results for input(s): "GLUCAP" in the last 168 hours. Lipid Profile: No results for input(s): "CHOL", "HDL", "LDLCALC", "TRIG", "CHOLHDL", "LDLDIRECT" in the last 72 hours. Thyroid Function Tests: No results for input(s): "TSH", "T4TOTAL", "FREET4", "T3FREE", "THYROIDAB" in the last 72 hours. Anemia Panel: Recent Labs    11/25/22 0126  VITAMINB12 685  FOLATE 27.3   Urine analysis:    Component Value Date/Time   COLORURINE YELLOW 11/24/2022 1945   APPEARANCEUR HAZY (A) 11/24/2022 1945   LABSPEC 1.014 11/24/2022 1945   PHURINE 5.0 11/24/2022 1945   GLUCOSEU NEGATIVE 11/24/2022 1945   HGBUR NEGATIVE 11/24/2022 1945   BILIRUBINUR NEGATIVE 11/24/2022 1945   KETONESUR NEGATIVE 11/24/2022 1945   PROTEINUR 100 (A) 11/24/2022 1945   NITRITE NEGATIVE 11/24/2022 1945   LEUKOCYTESUR NEGATIVE 11/24/2022 1945   Sepsis Labs: @LABRCNTIP (procalcitonin:4,lacticidven:4)  )No results found for this or any previous visit (from the past 240 hour(s)).   Radiology Studies: NM Pulmonary Perfusion  Result Date: 11/25/2022 CLINICAL DATA:  Shortness of breath EXAM: NUCLEAR MEDICINE PERFUSION LUNG SCAN TECHNIQUE: Perfusion images were obtained in multiple projections after intravenous injection of radiopharmaceutical. Ventilation scans intentionally deferred if perfusion scan and chest x-ray adequate for interpretation during COVID 19 epidemic. RADIOPHARMACEUTICALS:   4.6 mCi Tc-52m MAA IV COMPARISON:  X-ray 11/24/2022 FINDINGS: Focal defect along the left hemithorax consistent with patient's battery pack for defibrillator. Mediastinal shadows enlarged consistent with patient's enlarged heart. Otherwise only a few small subsegmental peripheral defects identified. No larger defects seen on perfusion. IMPRESSION: Very low probability perfusion lung scan Electronically Signed   By: Karen Kays M.D.   On: 11/25/2022 12:43     Scheduled Meds:  aspirin EC  81 mg Oral Daily   atorvastatin  20 mg Oral q1800   carvedilol  12.5 mg Oral BID WC   empagliflozin  10 mg Oral Daily   famotidine  20 mg Oral QHS   furosemide  40 mg Intravenous BID   nystatin cream  1 Application Topical BID   Oxcarbazepine  300 mg Oral Daily   venlafaxine XR  150 mg Oral QHS   venlafaxine XR  75 mg Oral Q breakfast   Continuous Infusions:   LOS: 3 days    Time spent:    Zannie Cove, MD [facility]   11/27/2022, 12:03 PM

## 2022-11-27 NOTE — [place] FL2 (Signed)
[place] MEDICAID FL2 LEVEL OF CARE FORM     IDENTIFICATION  Patient Name: Marie Ramos Birthdate: 21-Aug-1955 Sex: female Admission Date (Current Location): 11/24/2022  [facility] and [place] Number:  Producer, television/film/video and Address:  [facility] [place], [address] [place], [place] [address]      Provider Number: [id number]  Attending Physician Name and Address:  Zannie Cove, MD  Relative Name and Phone Number:       Current Level of Care: Hospital Recommended Level of Care: Skilled Nursing Facility Prior Approval Number:    Date Approved/Denied:   PASRR Number:    Discharge Plan: SNF    Current Diagnoses: Patient Active Problem List   Diagnosis Date Noted   Acute on chronic diastolic CHF (congestive heart failure) (HCC) 11/24/2022   Class 3 obesity (HCC) 11/29/2021   Pneumonia due to COVID-19 virus 08/16/2019   Pulmonary vascular congestion 08/16/2019   Acute metabolic encephalopathy 08/16/2019   Closed fracture of left distal radius 12/01/2018   Cerebrovascular accident (CVA) due to embolism of cerebral artery (HCC) 12/05/2015   HLD (hyperlipidemia) 12/05/2015   AICD (automatic cardioverter/defibrillator) present 12/05/2015   DVT (deep venous thrombosis) (HCC) 12/05/2015   E. coli UTI 09/05/2015   Anoxic brain injury (HCC) 09/05/2015   Abnormality of gait    Acute bilat watershed infarction [facility])    Notalgia    Acute systolic congestive heart failure (HCC)    Benign essential HTN    Acute lower UTI    Acute kidney injury superimposed on chronic kidney disease (HCC)    History of traumatic brain injury    Bipolar affective disorder in remission (HCC)    Leukocytosis    Thrombocytosis    Cerebral infarction due to embolism of cerebral artery (HCC)    Anoxic brain damage (HCC)    Cough    Essential hypertension    Systolic dysfunction with acute on chronic heart failure (HCC)    Hypertrophic obstructive cardiomyopathy (HCC)  08/29/2015   QT prolongation 08/29/2015   Hypokalemia 08/29/2015   Acute respiratory failure with hypoxia (HCC)    Cardiac arrest (HCC) 08/24/2015    Orientation RESPIRATION BLADDER Height & Weight     Self, Time, Situation, Place  O2 (2 liters) Incontinent, External catheter Weight: 259 lb 14.8 oz (117.9 kg) Height:  5\' 2"  (157.5 cm)  BEHAVIORAL SYMPTOMS/MOOD NEUROLOGICAL BOWEL NUTRITION STATUS      Continent Diet (See dc summary)  AMBULATORY STATUS COMMUNICATION OF NEEDS Skin   Limited Assist Verbally Normal                       Personal Care Assistance Level of Assistance  Bathing, Feeding, Dressing Bathing Assistance: Limited assistance Feeding assistance: Independent Dressing Assistance: Limited assistance     Functional Limitations Info  Sight, Hearing, Speech Sight Info: Adequate Hearing Info: Adequate Speech Info: Adequate    SPECIAL CARE FACTORS FREQUENCY  PT (By licensed PT), OT (By licensed OT)     PT Frequency: 5xweek OT Frequency: 5xweek            Contractures Contractures Info: Not present    Additional Factors Info  Code Status, Allergies Code Status Info: Full Allergies Info: Sulfa Antibiotics  Albuterol  Other  Sulfamethoxazole-trimethoprim  Vicodin Hp (Hydrocodone-acetaminophen)  Naproxen  Vicodin (Hydrocodone-acetaminophen)           Current Medications (11/27/2022):  This is the current hospital active medication list Current Facility-Administered Medications  Medication Dose Route Frequency Provider Last Rate Last Admin   acetaminophen (TYLENOL) tablet 500 mg  500 mg Oral Q6H PRN Buena Irish, MD   500 mg at 11/26/22 2321   Or   acetaminophen (TYLENOL) suppository 650 mg  650 mg Rectal Q6H PRN Buena Irish, MD       ALPRAZolam Prudy Feeler) tablet 0.5 mg  0.5 mg Oral QHS PRN Buena Irish, MD   0.5 mg at 11/26/22 2321   aspirin EC tablet 81 mg  81 mg Oral Daily Buena Irish, MD   81 mg at 11/27/22 0805    atorvastatin (LIPITOR) tablet 20 mg  20 mg Oral q1800 Buena Irish, MD   20 mg at 11/26/22 1751   bisacodyl (DULCOLAX) EC tablet 5 mg  5 mg Oral Daily PRN Buena Irish, MD       carvedilol (COREG) tablet 12.5 mg  12.5 mg Oral BID WC Buena Irish, MD   12.5 mg at 11/27/22 0805   empagliflozin (JARDIANCE) tablet 10 mg  10 mg Oral Daily Zannie Cove, MD   10 mg at 11/27/22 0805   famotidine (PEPCID) tablet 20 mg  20 mg Oral QHS Buena Irish, MD   20 mg at 11/26/22 2121   furosemide (LASIX) injection 40 mg  40 mg Intravenous BID Zannie Cove, MD   40 mg at 11/27/22 0805   nystatin cream (MYCOSTATIN) 1 Application  1 Application Topical BID Buena Irish, MD   1 Application at 11/27/22 1610   Oxcarbazepine (TRILEPTAL) tablet 300 mg  300 mg Oral Daily Zannie Cove, MD   300 mg at 11/27/22 0806   venlafaxine XR (EFFEXOR-XR) 24 hr capsule 150 mg  150 mg Oral QHS Zannie Cove, MD   150 mg at 11/26/22 2121   venlafaxine XR (EFFEXOR-XR) 24 hr capsule 75 mg  75 mg Oral Q breakfast Buena Irish, MD   75 mg at 11/27/22 9604     Discharge Medications: Please see discharge summary for a list of discharge medications.  Relevant Imaging Results:  Relevant Lab Results:   Additional Information SSN:[id number]  Oletta Lamas, MSW, LCSWA, LCASA Transitions of Care  Clinical Social Worker I

## 2022-11-27 NOTE — Progress Notes (Signed)
Physical Therapy Treatment Patient Details Name: Marie Ramos MRN: [id number] DOB: [birth year] Today's Date: 11/27/2022   History of Present Illness Pt is 67 yo female admitted on 11/24/22 with acute on chronic CHF, weakness, and difficulty with ambulation. Pt with hx including but not limited to cardiac arrest, anoxic brain injury due to cardiac arrest, CKD, heart failure, bipolar and obesity, CVA    PT Comments    Pt was seen for mobility on side of bed in chair, to review all her HEP and work to strengthen hips and knees.  Pt is able to follow directions but cannot remember all details after being instructed.  Will need written information for any carry over to home and rehab.  Pt is in attendance of son who is able to observe and ask questions as needed.  Follow along for all goals of PT as are outlined on POC.   Recommendations for follow up therapy are one component of a multi-disciplinary discharge planning process, led by the attending physician.  Recommendations may be updated based on patient status, additional functional criteria and insurance authorization.  Follow Up Recommendations       Assistance Recommended at Discharge Frequent or constant Supervision/Assistance  Patient can return home with the following A little help with walking and/or transfers;A little help with bathing/dressing/bathroom;Assistance with cooking/housework;Help with stairs or ramp for entrance   Equipment Recommendations  None recommended by PT    Recommendations for Other Services Rehab consult     Precautions / Restrictions Precautions Precautions: Fall Restrictions Weight Bearing Restrictions: No     Mobility  Bed Mobility Overal bed mobility: Needs Assistance             General bed mobility comments: in chair when PT arrives    Transfers Overall transfer level: Needs assistance Equipment used: Rolling walker (2 wheels) Transfers: Sit to/from Stand Sit to Stand: Min assist,  Min guard           General transfer comment: reminders to use hands on handrails    Ambulation/Gait               General Gait Details: deferred   Stairs             Wheelchair Mobility    Modified Rankin (Stroke Patients Only)       Balance Overall balance assessment: Needs assistance, History of Falls Sitting-balance support: Feet supported Sitting balance-Leahy Scale: Good                                      Cognition Arousal/Alertness: Awake/alert Behavior During Therapy: WFL for tasks assessed/performed Overall Cognitive Status: Impaired/Different from baseline Area of Impairment: Problem solving, Following commands, Safety/judgement                     Memory: Decreased short-term memory Following Commands: Follows one step commands with increased time, Follows one step commands inconsistently Safety/Judgement: Decreased awareness of deficits, Decreased awareness of safety   Problem Solving: Slow processing, Requires verbal cues, Requires tactile cues General Comments: delayed responses to verbal instructions that require hands on to finish the task        Exercises General Exercises - Lower Extremity Ankle Circles/Pumps: AROM, 10 reps Quad Sets: AROM, 10 reps Gluteal Sets: AROM, 10 reps Long Arc Quad: Strengthening, 10 reps Heel Slides: Strengthening, 10 reps Hip ABduction/ADduction: AROM, 10 reps Hip Flexion/Marching: AROM, 10  reps    General Comments General comments (skin integrity, edema, etc.): Pt is slow to respond to cues for esp repositioning on chair and getting comfortable with adjustment of chair.  Follow through is slow and forgets the details      Pertinent Vitals/Pain Pain Assessment Pain Assessment: Faces Faces Pain Scale: Hurts a little bit Pain Location: generalized soreness; RLE Pain Descriptors / Indicators: Sore Pain Intervention(s): Limited activity within patient's tolerance, Monitored  during session, Repositioned    Home Living                          Prior Function            PT Goals (current goals can now be found in the care plan section) Acute Rehab PT Goals Patient Stated Goal: to get walking and home    Frequency    Min 1X/week      PT Plan Current plan remains appropriate    Co-evaluation              AM-PAC PT "6 Clicks" Mobility   Outcome Measure  Help needed turning from your back to your side while in a flat bed without using bedrails?: A Lot Help needed moving from lying on your back to sitting on the side of a flat bed without using bedrails?: A Lot Help needed moving to and from a bed to a chair (including a wheelchair)?: A Little Help needed standing up from a chair using your arms (e.g., wheelchair or bedside chair)?: A Little Help needed to walk in hospital room?: A Little Help needed climbing 3-5 steps with a railing? : Total 6 Click Score: 14    End of Session   Activity Tolerance: Patient tolerated treatment well Patient left: with call bell/phone within reach;in chair;with family/visitor present Nurse Communication: Mobility status PT Visit Diagnosis: Other abnormalities of gait and mobility (R26.89);Muscle weakness (generalized) (M62.81)     Time: 1610-9604 PT Time Calculation (min) (ACUTE ONLY): 25 min  Charges:  $Therapeutic Exercise: 8-22 mins $Therapeutic Activity: 8-22 mins      Ivar Drape 11/27/2022, 5:30 PM  Samul Dada, PT PhD Acute Rehab Dept. Number: [facility] [id number] and [facility] [phone]

## 2022-11-27 NOTE — Plan of Care (Signed)

## 2022-11-28 ENCOUNTER — Telehealth: Payer: Self-pay | Admitting: Physician Assistant

## 2022-11-28 ENCOUNTER — Encounter (HOSPITAL_COMMUNITY): Payer: Self-pay | Admitting: Internal Medicine

## 2022-11-28 ENCOUNTER — Telehealth: Payer: Self-pay | Admitting: Internal Medicine

## 2022-11-28 ENCOUNTER — Other Ambulatory Visit (HOSPITAL_COMMUNITY): Payer: Self-pay

## 2022-11-28 DIAGNOSIS — I5033 Acute on chronic diastolic (congestive) heart failure: Secondary | ICD-10-CM | POA: Diagnosis not present

## 2022-11-28 LAB — BASIC METABOLIC PANEL
Anion gap: 10 (ref 5–15)
BUN: 49 mg/dL — ABNORMAL HIGH (ref 8–23)
CO2: 36 mmol/L — ABNORMAL HIGH (ref 22–32)
Calcium: 9.2 mg/dL (ref 8.9–10.3)
Chloride: 91 mmol/L — ABNORMAL LOW (ref 98–111)
Creatinine, Ser: 1.72 mg/dL — ABNORMAL HIGH (ref 0.44–1.00)
GFR, Estimated: 32 mL/min — ABNORMAL LOW (ref >60)
Glucose, Bld: 97 mg/dL (ref 70–99)
Potassium: 4.1 mmol/L (ref 3.5–5.1)
Sodium: 137 mmol/L (ref 135–145)

## 2022-11-28 MED ORDER — FUROSEMIDE 40 MG PO TABS
40.0000 mg | ORAL_TABLET | Freq: Every day | ORAL | Status: DC
  Administered 2022-11-28 – 2022-11-29 (×2): 40 mg via ORAL
  Filled 2022-11-28 (×2): qty 1

## 2022-11-28 MED ORDER — ENOXAPARIN SODIUM 60 MG/0.6ML IJ SOSY
60.0000 mg | PREFILLED_SYRINGE | Freq: Every day | INTRAMUSCULAR | Status: DC
  Administered 2022-11-28 – 2022-12-02 (×5): 60 mg via SUBCUTANEOUS
  Filled 2022-11-28 (×5): qty 0.6

## 2022-11-28 NOTE — Social Work (Signed)
Name: Marie Ramos DOB: 05/21/1956  Please be advised that the above-named patient will require a short-term nursing home stay -- anticipated 30 days or less for rehabilitation and strengthening. The plan is for return home.

## 2022-11-28 NOTE — Telephone Encounter (Signed)
FYI: Patient is currently in the hospital due to multiple falls. Will be going to inpatient rehab ? Tuesday in hopes to regain some mobility. Patient and daughter were on the phone, told that while she was inpatient that their providers took lead and we were hands off until she was discharged home. Patient did say that her tremors were better on the reduced medication, but she didn't remember the name of the medication.

## 2022-11-28 NOTE — Progress Notes (Signed)
Physical Therapy Treatment Patient Details Name: Marie Ramos MRN: [id number] DOB: 1955-10-01 Today's Date: 11/28/2022   History of Present Illness Pt is 67 yo female admitted on 11/24/22 with acute on chronic CHF, weakness, and difficulty with ambulation. Pt with hx including but not limited to cardiac arrest, anoxic brain injury due to cardiac arrest, CKD, heart failure, bipolar and obesity, CVA    PT Comments    Pt was seen for mobility on RW with O2 line to reach to her bathroom, and now is taking longer walks with nursing.  Pt was expecting to get a BSC next to her recliner and talked with her about using the nursing staff to get to BR given her sats and pulse were well controlled with gait.  Will follow along with her and updating dc expectations to <3 hours a day inpatient and will focus on balance and strength to aid gait quality.   Recommendations for follow up therapy are one component of a multi-disciplinary discharge planning process, led by the attending physician.  Recommendations may be updated based on patient status, additional functional criteria and insurance authorization.  Follow Up Recommendations  Can patient physically be transported by private vehicle: No    Assistance Recommended at Discharge Frequent or constant Supervision/Assistance  Patient can return home with the following A little help with walking and/or transfers;A little help with bathing/dressing/bathroom;Assistance with cooking/housework;Assist for transportation;Help with stairs or ramp for entrance   Equipment Recommendations  None recommended by PT    Recommendations for Other Services       Precautions / Restrictions Precautions Precautions: Fall Restrictions Weight Bearing Restrictions: No     Mobility  Bed Mobility Overal bed mobility: Needs Assistance             General bed mobility comments: up in chair    Transfers Overall transfer level: Needs assistance Equipment  used: Rolling walker (2 wheels) Transfers: Sit to/from Stand Sit to Stand: Min guard           General transfer comment: min guard in BR with rail and min assist from recliner    Ambulation/Gait Ambulation/Gait assistance: Min guard Gait Distance (Feet): 45 Feet Assistive device: Rolling walker (2 wheels) Gait Pattern/deviations: Step-through pattern, Decreased stride length, Wide base of support Gait velocity: decreased Gait velocity interpretation: <1.31 ft/sec, indicative of household ambulator Pre-gait activities: check of vitals     Stairs             Wheelchair Mobility    Modified Rankin (Stroke Patients Only)       Balance Overall balance assessment: Needs assistance, History of Falls Sitting-balance support: Feet supported Sitting balance-Leahy Scale: Good     Standing balance support: Bilateral upper extremity supported, During functional activity Standing balance-Leahy Scale: Poor Standing balance comment: getting closer to fair balance in standing                            Cognition Arousal/Alertness: Awake/alert Behavior During Therapy: WFL for tasks assessed/performed Overall Cognitive Status: Impaired/Different from baseline Area of Impairment: Safety/judgement, Awareness                       Following Commands: Follows one step commands with increased time Safety/Judgement: Decreased awareness of deficits Awareness: Intellectual Problem Solving: Slow processing General Comments: requires repetitive instructions and does not always know what her needs for assist will be  Exercises General Exercises - Lower Extremity Ankle Circles/Pumps: AROM, 10 reps Quad Sets: AROM, 10 reps Gluteal Sets: AROM, 10 reps Hip ABduction/ADduction: AROM, 10 reps    General Comments General comments (skin integrity, edema, etc.): Pt is fair balance with RW and less than fair without walker but does not always know how much  help she needs, repetitive instructions needed      Pertinent Vitals/Pain Pain Assessment Pain Assessment: No/denies pain    Home Living                          Prior Function            PT Goals (current goals can now be found in the care plan section) Acute Rehab PT Goals Patient Stated Goal: to get walking and home Progress towards PT goals: Progressing toward goals    Frequency    Min 1X/week      PT Plan Discharge plan needs to be updated    Co-evaluation              AM-PAC PT "6 Clicks" Mobility   Outcome Measure  Help needed turning from your back to your side while in a flat bed without using bedrails?: A Little Help needed moving from lying on your back to sitting on the side of a flat bed without using bedrails?: A Lot Help needed moving to and from a bed to a chair (including a wheelchair)?: A Little Help needed standing up from a chair using your arms (e.g., wheelchair or bedside chair)?: A Little Help needed to walk in hospital room?: A Little Help needed climbing 3-5 steps with a railing? : Total 6 Click Score: 15    End of Session Equipment Utilized During Treatment: Gait belt;Oxygen Activity Tolerance: Patient tolerated treatment well Patient left: with call bell/phone within reach;in chair;with family/visitor present Nurse Communication: Mobility status PT Visit Diagnosis: Other abnormalities of gait and mobility (R26.89);Muscle weakness (generalized) (M62.81)     Time: 1340-1406 PT Time Calculation (min) (ACUTE ONLY): 26 min  Charges:  $Gait Training: 8-22 mins $Therapeutic Exercise: 8-22 mins         Ivar Drape 11/28/2022, 2:56 PM  Samul Dada, PT PhD Acute Rehab Dept. Number: [facility] [id number] and [facility] [phone]

## 2022-11-28 NOTE — Telephone Encounter (Signed)
Patient's husband [name] lvm at 11:09 regarding an update for [place]. States she has been in the hospital since late Monday morning. Pls rtc as he would like to give a full summary of what's going on with her. Ph:[phone]

## 2022-11-28 NOTE — Progress Notes (Signed)
   Heart Failure Stewardship Pharmacist Progress Note   PCP: Daisy Floro, MD PCP-Cardiologist: Orbie Pyo, MD    HPI:  67 yo F with PMH of CHF, HTN, CVA, TBI, anoxic brain injury, HLD and DVT.   She presented to the ED on 5/20 with bilateral knee pain and decreased mobility after several falls. Reports that she has not been able to get up from the couch and her family was bringing her a bedpan. CXR with mild to moderate bilateral interstitial thickening representing scarring vs pulmonary edema. Last ECHO from 08/2022 showed LVEF 55%, severe LVH, G1DD, and RV normal. Screened for CIR and now recommended for SNF at discharge.  Current HF Medications: Beta Blocker: carvedilol 12.5 mg BID SGLT2i: Jardiance 10 mg daily  Prior to admission HF Medications: Diuretic: furosemide 40 mg BID Beta blocker: carvedilol 12.5 mg BID ACE/ARB/ARNI: losartan 100 mg daily  Pertinent Lab Values: Serum creatinine 1.72, BUN 49, Potassium 4.1, Sodium 137, BNP 120.2, Magnesium 2.4, A1c 6.2   Vital Signs: Weight: 263 lbs (admission weight: 260 lbs) Blood pressure: 140-160/70s  Heart rate: 70s  I/O: -1.7L yesterday; net -4.4L  Medication Assistance / Insurance Benefits Check: Does the patient have prescription insurance?  Yes Type of insurance plan: [employer]  Outpatient Pharmacy:  Prior to admission outpatient pharmacy: [facility] and mail order Is the patient willing to use [facility] TOC pharmacy at discharge? Yes Is the patient willing to transition their outpatient pharmacy to utilize a [facility] outpatient pharmacy?   No    Assessment: 1. Acute on chronic diastolic CHF (LVEF 55%). NYHA class II symptoms. - SCr stable ~1.7. Baseline appears to be ~ 1.5 - Stopping IV lasix. Strict I/Os and daily weights. Keep K>4 and Mg>2.  - Continue carvedilol 12.5 mg BID - Consider restarting losartan tomorrow if creatinine stable - Consider adding MRA prior to discharge vs at follow up - Continue  Jardiance 10 mg daily   Plan: 1) Medication changes recommended at this time: - Restart losartan tomorrow  2) Patient assistance: - None pending; SNF at discharge - Jardiance copay $45  3)  Education  - Patient has been educated on current HF medications and potential additions to HF medication regimen - Patient verbalizes understanding that over the next few months, these medication doses may change and more medications may be added to optimize HF regimen - Patient has been educated on basic disease state pathophysiology and goals of therapy   Sharen Hones, PharmD, BCPS Heart Failure Stewardship Pharmacist Phone [phone]

## 2022-11-28 NOTE — TOC Progression Note (Signed)
Transition of Care [facility]) - Progression Note    Patient Details  Name: Marie Ramos MRN: [id number] Date of Birth: [birth year]  Transition of Care [facility]) CM/SW Contact  Elliot Cousin, RN Phone Number: [phone] 11/28/2022, 1:01 PM  Clinical Narrative:   HF TOC CM referred to outpatient HF Paramedicine for assistance in community. Updated Unit CM.     Expected Discharge Plan: Skilled Nursing Facility Barriers to Discharge: Continued Medical Work up  Expected Discharge Plan and Services       Living arrangements for the past 2 months: Single Family Home                                       Social Determinants of Health (SDOH) Interventions SDOH Screenings   Food Insecurity: No Food Insecurity (11/28/2022)  Housing: Low Risk  (11/28/2022)  Transportation Needs: No Transportation Needs (11/28/2022)  Alcohol Screen: Low Risk  (11/28/2022)  Depression (PHQ2-9): Low Risk  (08/05/2018)  Financial Resource Strain: Low Risk  (11/28/2022)  Physical Activity: Inactive (08/05/2018)  Social Connections: Socially Integrated (08/05/2018)  Stress: Stress Concern Present (08/05/2018)  Tobacco Use: Low Risk  (11/28/2022)    Readmission Risk Interventions     No data to display

## 2022-11-28 NOTE — Care Management Important Message (Signed)
Important Message  Patient Details  Name: Marie Ramos MRN: [id number] Date of Birth: [birth year]   Medicare Important Message Given:  Yes     Dorena Bodo 11/28/2022, 3:27 PM

## 2022-11-28 NOTE — TOC Initial Note (Signed)
Transition of Care [facility]) - Initial/Assessment Note    Patient Details  Name: Marie Ramos MRN: [id number] Date of Birth: 02-Jan-1956  Transition of Care [facility]) CM/SW Contact:    Leander Rams, LCSW Phone Number: 11/27/2022, 7:53 AM  Clinical Narrative:                 CSW met with pt at bedside alongside pt son and spouse. Pt/family was informed that pt is no longer a candidate for CIR. Pt is agreeable to go to STR at SNF. Family would like to remain in [place] area. PASRR pending.   CSW completed fl2 and faxed out. TOC will continue to follow.   Expected Discharge Plan: Skilled Nursing Facility Barriers to Discharge: Continued Medical Work up   Patient Goals and CMS Choice            Expected Discharge Plan and Services       Living arrangements for the past 2 months: Single Family Home                                      Prior Living Arrangements/Services Living arrangements for the past 2 months: Single Family Home Lives with:: Spouse Patient language and need for interpreter reviewed:: Yes Do you feel safe going back to the place where you live?: Yes      Need for Family Participation in Patient Care: Yes (Comment)   Current home services: DME Criminal Activity/Legal Involvement Pertinent to Current Situation/Hospitalization: No - Comment as needed  Activities of Daily Living      Permission Sought/Granted                  Emotional Assessment Appearance:: Developmentally appropriate Attitude/Demeanor/Rapport: Engaged Affect (typically observed): Accepting Orientation: : Oriented to Self, Oriented to Place, Oriented to  Time, Oriented to Situation Alcohol / Substance Use: Not Applicable Psych Involvement: No (comment)  Admission diagnosis:  Acute exacerbation of CHF (congestive heart failure) (HCC) [I50.9] Acute respiratory failure with hypoxia (HCC) [J96.01] Generalized weakness [R53.1] Fall, initial encounter [W19.XXXA] Patient  Active Problem List   Diagnosis Date Noted   Acute on chronic diastolic CHF (congestive heart failure) (HCC) 11/24/2022   Class 3 obesity (HCC) 11/29/2021   Pneumonia due to COVID-19 virus 08/16/2019   Pulmonary vascular congestion 08/16/2019   Acute metabolic encephalopathy 08/16/2019   Closed fracture of left distal radius 12/01/2018   Cerebrovascular accident (CVA) due to embolism of cerebral artery (HCC) 12/05/2015   HLD (hyperlipidemia) 12/05/2015   AICD (automatic cardioverter/defibrillator) present 12/05/2015   DVT (deep venous thrombosis) (HCC) 12/05/2015   E. coli UTI 09/05/2015   Anoxic brain injury (HCC) 09/05/2015   Abnormality of gait    Acute bilat watershed infarction [facility])    Notalgia    Acute systolic congestive heart failure (HCC)    Benign essential HTN    Acute lower UTI    Acute kidney injury superimposed on chronic kidney disease (HCC)    History of traumatic brain injury    Bipolar affective disorder in remission (HCC)    Leukocytosis    Thrombocytosis    Cerebral infarction due to embolism of cerebral artery (HCC)    Anoxic brain damage (HCC)    Cough    Essential hypertension    Systolic dysfunction with acute on chronic heart failure (HCC)    Hypertrophic obstructive cardiomyopathy (HCC) 08/29/2015   QT prolongation 08/29/2015  Hypokalemia 08/29/2015   Acute respiratory failure with hypoxia [facility])    Cardiac arrest (HCC) 08/24/2015   PCP:  Daisy Floro, MD Pharmacy:   [facility] - [place], [place] - [address] [place] [address] Phone: [phone] Fax: [phone]  [facility] [address], [place] - [address] [place] [address] Phone: [phone] Fax: [phone]     Social Determinants of Health (SDOH) Social History: SDOH Screenings   Food Insecurity: No Food Insecurity (08/05/2018)  Transportation Needs: No Transportation Needs  (08/05/2018)  Depression (PHQ2-9): Low Risk  (08/05/2018)  Financial Resource Strain: Low Risk  (08/05/2018)  Physical Activity: Inactive (08/05/2018)  Social Connections: Socially Integrated (08/05/2018)  Stress: Stress Concern Present (08/05/2018)  Tobacco Use: Low Risk  (11/24/2022)   SDOH Interventions:     Readmission Risk Interventions     No data to display        Oletta Lamas, MSW, LCSWA, LCASA Transitions of Care  Clinical Social Worker I

## 2022-11-28 NOTE — Progress Notes (Signed)
Heart Failure Nurse Navigator Progress Note  PCP: Daisy Floro, MD PCP-Cardiologist: Lynnette Caffey Admission Diagnosis: Fall, Generalized weakness, Acute respiratory failure with hypoxia.  Admitted from: Home via EMS  Presentation:   Marie Ramos presented with falls, 6-10 over last 6 weeks, on EMS arrival O2 sats were in the 70's, BMI 48.15, CXR with mild to moderate bilateral interstitial thickening showing scarring vs pulmonary edema. Plan for patient to go to SNF Rehab at discharge.   Patient and son/daughter were educated on the sign and symptoms of heart failure, daily weights, when to call her doctor or go to the ED, Diet/ fluid restrictions, ( reported that husband does all the cooking, a lot of eating up frozen meals. Taking all medications as prescribed, ( reported from family concern with how husband is giving medication, concern that he is not giving correct doses depending on what he thinks she should get or his conspiracy theories ( example Entresto cause Alzheimers") They are unsure if she gets everything she needs ( A CSW consult for possible Para medicine program ) Patient is slow to respond has some cognitive deficits due to anoxic injury from a cardiac arrest. Family verbalized their understanding if education a HF TOC appointment was scheduled for after SNF rehab discharge on 12/23/2022 @ 10 am.   ECHO/ LVEF: 55%  Clinical Course:  Past Medical History:  Diagnosis Date   Abnormality of gait    Acute bilat watershed infarction [facility])    Acute lower UTI    Acute metabolic encephalopathy 08/16/2019   Acute respiratory failure (HCC)    Acute systolic congestive heart failure (HCC)    AKI (acute kidney injury) (HCC)    Anoxic brain damage (HCC)    Bipolar 1 disorder (HCC)    Bipolar affective disorder in remission (HCC)    Cardiac arrest (HCC) 08/24/2015   Cerebral infarction due to embolism of cerebral artery (HCC)    Cerebrovascular accident (CVA) due to embolism of  cerebral artery (HCC) 12/05/2015   Chronically dry eyes    Closed fracture of left distal radius 12/01/2018   DVT (deep venous thrombosis) (HCC) 12/05/2015   Dyspnea    r/t seasonal allergies   Hypertrophic obstructive cardiomyopathy (HCC) 08/29/2015   Leukocytosis    Macular degeneration    Pneumonia due to COVID-19 virus 08/16/2019   Pulmonary vascular congestion 08/16/2019   QT prolongation 08/29/2015   Stroke (HCC)    Systolic dysfunction with acute on chronic heart failure (HCC)    TBI (traumatic brain injury) (HCC) 2011   Inpatient rehab [facility]   Thrombocytosis    Ventricular fibrillation (HCC) 09/03/15   MDT ICD Dr. Johney Frame     Social History   Socioeconomic History   Marital status: Married    Spouse name: Riley Lam   Number of children: 2   Years of education: Not on file   Highest education level: Master's degree (e.g., MA, MS, MEng, MEd, MSW, MBA)  Occupational History   Occupation: substitute Magazine features editor: [employer]  Tobacco Use   Smoking status: Never   Smokeless tobacco: Never  Vaping Use   Vaping Use: Never used  Substance and Sexual Activity   Alcohol use: No   Drug use: No   Sexual activity: Yes    Birth control/protection: Surgical  Other Topics Concern   Not on file  Social History Narrative   Married to [place] for 39 years 1st and only marriage.   2 kids, Christiane Ha is 14,  and [name] is 32.      Has MBA in human resources but always wanted to teach.  Taught some business courses in past, but likes sub teaching.  High school or either pre-K.        From [place]. Moved here in 1997 b/c of husband's job at [employer].  Pt's son lives in [place]. Dtr lives in [place].  Has 1 granddaughter, 2 yo.      Grew up in home w/ both parents and sister.  Never abused.       Never legal issues except declared bankruptcy.      Caffeine 1 cup coffee. Or 1 coke a day.      Is Jewish.      Is on Disability for Mental health and heart issues, for 2  years.          Social Determinants of Health   Financial Resource Strain: Low Risk  (11/28/2022)   Overall Financial Resource Strain (CARDIA)    Difficulty of Paying Living Expenses: Not very hard  Food Insecurity: No Food Insecurity (11/28/2022)   Hunger Vital Sign    Worried About Running Out of Food in the Last Year: Never true    Ran Out of Food in the Last Year: Never true  Transportation Needs: No Transportation Needs (11/28/2022)   PRAPARE - Administrator, Civil Service (Medical): No    Lack of Transportation (Non-Medical): No  Physical Activity: Inactive (08/05/2018)   Exercise Vital Sign    Days of Exercise per Week: 0 days    Minutes of Exercise per Session: 0 min  Stress: Stress Concern Present (08/05/2018)   [employer] of Occupational Health - Occupational Stress Questionnaire    Feeling of Stress : To some extent  Social Connections: Socially Integrated (08/05/2018)   Social Connection and Isolation Panel [NHANES]    Frequency of Communication with Friends and Family: More than three times a week    Frequency of Social Gatherings with Friends and Family: Once a week    Attends Religious Services: More than 4 times per year    Active Member of [employer] or Organizations: Yes    Attends Engineer, structural: More than 4 times per year    Marital Status: Married   Water engineer and Provision:  Detailed education and instructions provided on heart failure disease management including the following:  Signs and symptoms of Heart Failure When to call the physician Importance of daily weights Low sodium diet Fluid restriction Medication management Anticipated future follow-up appointments  Patient education given on each of the above topics.  Patient acknowledges understanding via teach back method and acceptance of all instructions.  Education Materials:  "Living Better With Heart Failure" Booklet, HF zone tool, & Daily Weight Tracker  Tool.  Patient has scale at home: yes Patient has pill box at home: yes    High Risk Criteria for Readmission and/or Poor Patient Outcomes: Heart failure hospital admissions (last 6 months): 1  No Show rate: 7% Difficult social situation: lives with husband, going to SNF rehab for 10-14 days.  Demonstrates medication adherence: ?, husband does her medications, FAMILY REPORTED concerns THAT HE DOESN'T ALWAYS GIVE HER THE CORRECT DOSES .  Primary Language: English Literacy level: some cognitive deficits from anoxic brain injury from CPR.   Barriers of Care:   Medication compliance ( per family, husband doesn't always give her medication as prescribed)  Daily weights Continued HF education Diet/ fluid restrictions  Considerations/Referrals:   Referral made to Heart Failure Pharmacist Stewardship: Yes Referral made to Heart Failure CSW/NCM TOC: Yes, para medicine for correct medication compliance Referral made to Heart & Vascular TOC clinic: Yes, per Jomarie Longs 3-4 weeks after discharge ( SNF rehab)   Items for Follow-up on DC/TOC: Medication compliance Diet/ fluids/ daily weights Continued HF education   Rhae Hammock, BSN, RN Heart Failure Print production planner Chat Only

## 2022-11-28 NOTE — Telephone Encounter (Signed)
Patient's daughter wanted to inform Dr. Lynnette Caffey that the patient is admitted in the hospital and they are recommending skilled nursing when she is discharged.

## 2022-11-28 NOTE — Progress Notes (Signed)
PROGRESS NOTE    Marie Ramos  ZOX:[id number] DOB: 1955/10/22 DOA: 11/24/2022 PCP: Daisy Floro, MD  67/F with history of cardiac arrest, anoxic brain injury, CKD, diastolic CHF, bipolar disorder and obesity presented to the ED after mechanical fall.  History of frequent falls over the last 6 months, last fall 3 days PTA, limited mobility since then, prior to admission furosemide dose was increased due to volume overload however continues to be weak and deconditioned. -Vital signs stable, sodium 138, BUN 46, creatinine 1.7, mag 2.9, troponin 47, hemoglobin 10.3, D-dimer 1.9 -Head CT negative for acute changes.  -Chest x-ray noted cardiomegaly pulmonary vascular congestion  -Admitted, started on IV Lasix    Subjective: -Feels better overall, breathing improving, more alert as well  Assessment and Plan:  Acute on chronic diastolic CHF Acute hypoxic respiratory failure -Echo with EF 55%, severe hypertrophy of the septal segment.  No outflow obstruction, RV systolic function is preserved.  -Sp ICD due to cardiac arrest.  -Diuresing on IV Lasix urine output inaccurate, at least 4 L negative, changed to oral Lasix today  -Continue Jardiance and carvedilol  -Creatinine higher than baseline, will avoid ARB/Aldactone  -Attempt to wean off O2 -PT OT following, declined by CIR, discharge planning, SNF recommended  Acute kidney injury superimposed on chronic kidney disease (HCC) CKD stage 3b.  -Baseline creatinine around 1.2, now stable in the 1.7 range -ARB discontinued, continue meds as above, oral Lasix  Debility/freq falls Tremors -PT/OT> CIR eval -High-dose ox carbamazepine likely contributing to frequent falls, tremors and unsteady gait, improved on lower dose  Anoxic brain damage (HCC) History of CVA with cognitive impairment. Continue aspirin and statin therapy.    Bipolar affective disorder in remission (HCC) Continue venlafaxine, oxcarbazepine, dose  decreased  Class 3 obesity (HCC) Calculated BMI is 47.6    DVT prophylaxis: lovenox Code Status: Full code Family Communication: Spouse at bedside Disposition Plan: SNF when bed available  Consultants:    Procedures:   Antimicrobials:    Objective: Vitals:   11/28/22 0536 11/28/22 0730 11/28/22 0806 11/28/22 1008  BP: (!) 141/77 (!) 167/81    Pulse: 76 77    Resp: 20 18    Temp: 98.6 F (37 C) 98.6 F (37 C)    TempSrc: Oral Oral    SpO2:  96% 91% (!) 86%  Weight: 119.4 kg     Height:        Intake/Output Summary (Last 24 hours) at 11/28/2022 1153 Last data filed at 11/28/2022 0804 Gross per 24 hour  Intake 1480 ml  Output 2850 ml  Net -1370 ml   Filed Weights   11/25/22 0923 11/26/22 0408 11/28/22 0536  Weight: 118.2 kg 117.9 kg 119.4 kg    Examination:  Pleasant elderly obese female sitting up in bed, AAOx3 HEENT: No JVD CVS: S1-S2, regular rhythm Lungs: Decreased breath sounds to bases otherwise clear Abdomen: Soft, nontender, bowel sounds present Extremities: No edema  Skin: no new rashes on exposed skin    Data Reviewed:   CBC: Recent Labs  Lab 11/24/22 1207 11/24/22 1219 11/25/22 0135 11/25/22 0505 11/27/22 0102  WBC 9.9  --   --   --  9.7  NEUTROABS 7.8*  --   --   --   --   HGB 10.3* 11.2*  11.2* 11.9* 12.2 10.6*  HCT 35.0* 33.0*  33.0* 35.0* 36.0 34.4*  MCV 106.7*  --   --   --  102.4*  PLT 451*  --   --   --  439*   Basic Metabolic Panel: Recent Labs  Lab 11/24/22 1207 11/24/22 1219 11/25/22 0126 11/25/22 0135 11/25/22 0505 11/26/22 0116 11/27/22 0102 11/28/22 0051  NA 138 138  139 144 143 142 137 136 137  K 4.9 4.9  4.9 4.3 4.4 4.6 4.1 4.1 4.1  CL 101 104 101  --   --  97* 92* 91*  CO2 28  --  33*  --   --  34* 35* 36*  GLUCOSE 72 74 117*  --   --  99 90 97  BUN 46* 49* 45*  --   --  46* 46* 49*  CREATININE 1.71* 1.70* 1.84*  --   --  1.67* 1.72* 1.72*  CALCIUM 8.9  --  9.2  --   --  8.9 9.0 9.2  MG 2.9*  --    --   --   --  2.4  --   --    GFR: Estimated Creatinine Clearance: 39 mL/min (A) (by C-G formula based on SCr of 1.72 mg/dL (H)). Liver Function Tests: Recent Labs  Lab 11/24/22 1207 11/25/22 0126  AST 23 50*  ALT 24 49*  ALKPHOS 95 107  BILITOT 0.5 0.3  PROT 6.4* 6.9  ALBUMIN 2.8* 2.9*   No results for input(s): "LIPASE", "AMYLASE" in the last 168 hours. No results for input(s): "AMMONIA" in the last 168 hours. Coagulation Profile: No results for input(s): "INR", "PROTIME" in the last 168 hours. Cardiac Enzymes: No results for input(s): "CKTOTAL", "CKMB", "CKMBINDEX", "TROPONINI" in the last 168 hours. BNP (last 3 results) No results for input(s): "PROBNP" in the last 8760 hours. HbA1C: No results for input(s): "HGBA1C" in the last 72 hours.  CBG: No results for input(s): "GLUCAP" in the last 168 hours. Lipid Profile: No results for input(s): "CHOL", "HDL", "LDLCALC", "TRIG", "CHOLHDL", "LDLDIRECT" in the last 72 hours. Thyroid Function Tests: No results for input(s): "TSH", "T4TOTAL", "FREET4", "T3FREE", "THYROIDAB" in the last 72 hours. Anemia Panel: No results for input(s): "VITAMINB12", "FOLATE", "FERRITIN", "TIBC", "IRON", "RETICCTPCT" in the last 72 hours.  Urine analysis:    Component Value Date/Time   COLORURINE YELLOW 11/24/2022 1945   APPEARANCEUR HAZY (A) 11/24/2022 1945   LABSPEC 1.014 11/24/2022 1945   PHURINE 5.0 11/24/2022 1945   GLUCOSEU NEGATIVE 11/24/2022 1945   HGBUR NEGATIVE 11/24/2022 1945   BILIRUBINUR NEGATIVE 11/24/2022 1945   KETONESUR NEGATIVE 11/24/2022 1945   PROTEINUR 100 (A) 11/24/2022 1945   NITRITE NEGATIVE 11/24/2022 1945   LEUKOCYTESUR NEGATIVE 11/24/2022 1945   Sepsis Labs: @LABRCNTIP (procalcitonin:4,lacticidven:4)  )No results found for this or any previous visit (from the past 240 hour(s)).   Radiology Studies: No results found.   Scheduled Meds:  aspirin EC  81 mg Oral Daily   atorvastatin  20 mg Oral q1800    carvedilol  12.5 mg Oral BID WC   empagliflozin  10 mg Oral Daily   enoxaparin (LOVENOX) injection  60 mg Subcutaneous Daily   famotidine  20 mg Oral QHS   nystatin cream  1 Application Topical BID   Oxcarbazepine  300 mg Oral Daily   venlafaxine XR  150 mg Oral QHS   venlafaxine XR  75 mg Oral Q breakfast   Continuous Infusions:   LOS: 4 days    Time spent:    Zannie Cove, MD [facility]   11/28/2022, 11:53 AM

## 2022-11-28 NOTE — Telephone Encounter (Signed)
Please let her or husband know I reviewed her ER and admission note. I'm glad she'll be going to inpt rehab after she leaves the hosp, it'll be good for her to build up her strength. I'm glad her tremor is better too. Have her make an appt w/ me about 2 weeks after she gets back home. Call sooner if needed.

## 2022-11-28 NOTE — TOC Progression Note (Signed)
Transition of Care [facility]) - Progression Note    Patient Details  Name: Marie Ramos MRN: [id number] Date of Birth: 26-Dec-1955  [facility]) CM/SW Contact  Leander Rams, LCSW Phone Number: 11/28/2022, 11:16 AM  Clinical Narrative:    CSW has uploaded additional documentation for PASRR. PASRR is pending.   TOC will continue to follow.    Expected Discharge Plan: Skilled Nursing Facility Barriers to Discharge: Continued Medical Work up  Expected Discharge Plan and Services       Living arrangements for the past 2 months: Single Family Home                                       Social Determinants of Health (SDOH) Interventions SDOH Screenings   Food Insecurity: No Food Insecurity (08/05/2018)  Transportation Needs: No Transportation Needs (08/05/2018)  Depression (PHQ2-9): Low Risk  (08/05/2018)  Financial Resource Strain: Low Risk  (08/05/2018)  Physical Activity: Inactive (08/05/2018)  Social Connections: Socially Integrated (08/05/2018)  Stress: Stress Concern Present (08/05/2018)  Tobacco Use: Low Risk  (11/24/2022)    Readmission Risk Interventions     No data to display         Oletta Lamas, MSW, LCSWA, LCASA Transitions of Care  Clinical Social Worker I

## 2022-11-29 DIAGNOSIS — I5033 Acute on chronic diastolic (congestive) heart failure: Secondary | ICD-10-CM | POA: Diagnosis not present

## 2022-11-29 LAB — BASIC METABOLIC PANEL
Anion gap: 10 (ref 5–15)
BUN: 50 mg/dL — ABNORMAL HIGH (ref 8–23)
CO2: 36 mmol/L — ABNORMAL HIGH (ref 22–32)
Calcium: 9.3 mg/dL (ref 8.9–10.3)
Chloride: 91 mmol/L — ABNORMAL LOW (ref 98–111)
Creatinine, Ser: 1.56 mg/dL — ABNORMAL HIGH (ref 0.44–1.00)
GFR, Estimated: 36 mL/min — ABNORMAL LOW (ref >60)
Glucose, Bld: 95 mg/dL (ref 70–99)
Potassium: 4 mmol/L (ref 3.5–5.1)
Sodium: 137 mmol/L (ref 135–145)

## 2022-11-29 MED ORDER — FUROSEMIDE 40 MG PO TABS
40.0000 mg | ORAL_TABLET | Freq: Two times a day (BID) | ORAL | Status: DC
  Administered 2022-11-29: 40 mg via ORAL
  Filled 2022-11-29: qty 1

## 2022-11-29 MED ORDER — POTASSIUM CHLORIDE CRYS ER 20 MEQ PO TBCR
20.0000 meq | EXTENDED_RELEASE_TABLET | Freq: Once | ORAL | Status: AC
  Administered 2022-11-29: 20 meq via ORAL
  Filled 2022-11-29: qty 1

## 2022-11-29 MED ORDER — AMLODIPINE BESYLATE 5 MG PO TABS
5.0000 mg | ORAL_TABLET | Freq: Every day | ORAL | Status: DC
  Administered 2022-11-29 – 2022-12-02 (×4): 5 mg via ORAL
  Filled 2022-11-29 (×4): qty 1

## 2022-11-29 NOTE — Progress Notes (Signed)
PROGRESS NOTE    Marie Ramos  ZOX:[id number] DOB: 10-31-1955 DOA: 11/24/2022 PCP: Daisy Floro, MD  67/F with history of cardiac arrest, anoxic brain injury, CKD, diastolic CHF, bipolar disorder and obesity presented to the ED after mechanical fall.  History of frequent falls over the last 6 months, last fall 3 days PTA, limited mobility since then, prior to admission furosemide dose was increased due to volume overload however continues to be weak and deconditioned. -Vital signs stable, sodium 138, BUN 46, creatinine 1.7, mag 2.9, troponin 47, hemoglobin 10.3, D-dimer 1.9 -Head CT negative for acute changes.  -Chest x-ray noted cardiomegaly pulmonary vascular congestion  -Admitted, started on IV Lasix    Subjective: -Feels better overall, still on oxygen  Assessment and Plan:  Acute on chronic diastolic CHF Acute hypoxic respiratory failure -Echo with EF 55%, severe hypertrophy of the septal segment.  No outflow obstruction, RV systolic function is preserved.  -Sp ICD due to cardiac arrest.  -Diuresing on IV Lasix urine output inaccurate, at least 4.7 L negative, change Lasix to 40 mg twice daily p.o. -Continue Jardiance and carvedilol, add low-dose amlodipine for high BP -Creatinine higher than baseline, will avoid ARB/Aldactone  -Attempt to wean off O2 -PT OT following, declined by CIR, discharge planning, SNF recommended, discharge planning, TOC following  Acute kidney injury superimposed on chronic kidney disease (HCC) CKD stage 3b.  -Baseline creatinine around 1.2, now stable in the 1.7 range -ARB discontinued, continue meds as above, oral Lasix  Debility/freq falls Tremors -PT/OT> CIR eval -High-dose ox carbamazepine likely contributing to frequent falls, tremors and unsteady gait, improved on lower dose  Anoxic brain damage (HCC) History of CVA with cognitive impairment. Continue aspirin and statin therapy.    Bipolar affective disorder in remission  (HCC) Continue venlafaxine, oxcarbazepine, dose decreased  Class 3 obesity (HCC) Calculated BMI is 47.6    DVT prophylaxis: lovenox Code Status: Full code Family Communication: Spouse at bedside yesterday Disposition Plan: SNF when bed available  Consultants:    Procedures:   Antimicrobials:    Objective: Vitals:   11/28/22 2340 11/29/22 0410 11/29/22 0425 11/29/22 0450  BP:  (!) 152/77  (!) 163/81  Pulse:  73  73  Resp:  18  18  Temp:  98.5 F (36.9 C)  97.8 F (36.6 C)  TempSrc:  Oral  Oral  SpO2: 95%  96% 95%  Weight:    117.3 kg  Height:        Intake/Output Summary (Last 24 hours) at 11/29/2022 0933 Last data filed at 11/29/2022 8119 Gross per 24 hour  Intake 120 ml  Output 500 ml  Net -380 ml   Filed Weights   11/26/22 0408 11/28/22 0536 11/29/22 0450  Weight: 117.9 kg 119.4 kg 117.3 kg    Examination:  Pleasant elderly female sitting up in the recliner, AAOx3, no distress HEENT: Neck obese unable to assess JVD CVS: S1-S2, regular rhythm Lungs: Decreased breath sounds to bases Abdomen: Soft, nontender, bowel sounds present Remedies: No edema Skin: no new rashes on exposed skin    Data Reviewed:   CBC: Recent Labs  Lab 11/24/22 1207 11/24/22 1219 11/25/22 0135 11/25/22 0505 11/27/22 0102  WBC 9.9  --   --   --  9.7  NEUTROABS 7.8*  --   --   --   --   HGB 10.3* 11.2*  11.2* 11.9* 12.2 10.6*  HCT 35.0* 33.0*  33.0* 35.0* 36.0 34.4*  MCV 106.7*  --   --   --  102.4*  PLT 451*  --   --   --  439*   Basic Metabolic Panel: Recent Labs  Lab 11/24/22 1207 11/24/22 1219 11/25/22 0126 11/25/22 0135 11/25/22 0505 11/26/22 0116 11/27/22 0102 11/28/22 0051 11/29/22 0040  NA 138   < > 144   < > 142 137 136 137 137  K 4.9   < > 4.3   < > 4.6 4.1 4.1 4.1 4.0  CL 101   < > 101  --   --  97* 92* 91* 91*  CO2 28  --  33*  --   --  34* 35* 36* 36*  GLUCOSE 72   < > 117*  --   --  99 90 97 95  BUN 46*   < > 45*  --   --  46* 46* 49* 50*   CREATININE 1.71*   < > 1.84*  --   --  1.67* 1.72* 1.72* 1.56*  CALCIUM 8.9  --  9.2  --   --  8.9 9.0 9.2 9.3  MG 2.9*  --   --   --   --  2.4  --   --   --    < > = values in this interval not displayed.   GFR: Estimated Creatinine Clearance: 42.5 mL/min (A) (by C-G formula based on SCr of 1.56 mg/dL (H)). Liver Function Tests: Recent Labs  Lab 11/24/22 1207 11/25/22 0126  AST 23 50*  ALT 24 49*  ALKPHOS 95 107  BILITOT 0.5 0.3  PROT 6.4* 6.9  ALBUMIN 2.8* 2.9*   No results for input(s): "LIPASE", "AMYLASE" in the last 168 hours. No results for input(s): "AMMONIA" in the last 168 hours. Coagulation Profile: No results for input(s): "INR", "PROTIME" in the last 168 hours. Cardiac Enzymes: No results for input(s): "CKTOTAL", "CKMB", "CKMBINDEX", "TROPONINI" in the last 168 hours. BNP (last 3 results) No results for input(s): "PROBNP" in the last 8760 hours. HbA1C: No results for input(s): "HGBA1C" in the last 72 hours.  CBG: No results for input(s): "GLUCAP" in the last 168 hours. Lipid Profile: No results for input(s): "CHOL", "HDL", "LDLCALC", "TRIG", "CHOLHDL", "LDLDIRECT" in the last 72 hours. Thyroid Function Tests: No results for input(s): "TSH", "T4TOTAL", "FREET4", "T3FREE", "THYROIDAB" in the last 72 hours. Anemia Panel: No results for input(s): "VITAMINB12", "FOLATE", "FERRITIN", "TIBC", "IRON", "RETICCTPCT" in the last 72 hours.  Urine analysis:    Component Value Date/Time   COLORURINE YELLOW 11/24/2022 1945   APPEARANCEUR HAZY (A) 11/24/2022 1945   LABSPEC 1.014 11/24/2022 1945   PHURINE 5.0 11/24/2022 1945   GLUCOSEU NEGATIVE 11/24/2022 1945   HGBUR NEGATIVE 11/24/2022 1945   BILIRUBINUR NEGATIVE 11/24/2022 1945   KETONESUR NEGATIVE 11/24/2022 1945   PROTEINUR 100 (A) 11/24/2022 1945   NITRITE NEGATIVE 11/24/2022 1945   LEUKOCYTESUR NEGATIVE 11/24/2022 1945   Sepsis Labs: @LABRCNTIP (procalcitonin:4,lacticidven:4)  )No results found for this or  any previous visit (from the past 240 hour(s)).   Radiology Studies: No results found.   Scheduled Meds:  aspirin EC  81 mg Oral Daily   atorvastatin  20 mg Oral q1800   carvedilol  12.5 mg Oral BID WC   empagliflozin  10 mg Oral Daily   enoxaparin (LOVENOX) injection  60 mg Subcutaneous Daily   famotidine  20 mg Oral QHS   furosemide  40 mg Oral BID   nystatin cream  1 Application Topical BID   Oxcarbazepine  300 mg Oral Daily   potassium chloride  20 mEq Oral Once   venlafaxine XR  150 mg Oral QHS   venlafaxine XR  75 mg Oral Q breakfast   Continuous Infusions:   LOS: 5 days    Time spent:    Zannie Cove, MD [facility]   11/29/2022, 9:33 AM

## 2022-11-29 NOTE — Progress Notes (Signed)
Mobility Specialist Progress Note    11/29/22 1449  Mobility  Activity Ambulated with assistance to bathroom;Ambulated with assistance in hallway  Level of Assistance Minimal assist, patient does 75% or more  Assistive Device Front wheel walker  Distance Ambulated (ft) 50 ft (10+40)  Activity Response Tolerated well  Mobility Referral Yes  $Mobility charge 1 Mobility  Mobility Specialist Start Time (ACUTE ONLY) 1432  Mobility Specialist Stop Time (ACUTE ONLY) 1448  Mobility Specialist Time Calculation (min) (ACUTE ONLY) 16 min   Pt received in chair and agreeable. No complaints on walk. Returned to chair with call bell in reach.   [place] Mobility Specialist  Please Neurosurgeon or Rehab Office at [phone]

## 2022-11-29 NOTE — Plan of Care (Signed)

## 2022-11-30 ENCOUNTER — Other Ambulatory Visit: Payer: Self-pay | Admitting: Physician Assistant

## 2022-11-30 DIAGNOSIS — I5033 Acute on chronic diastolic (congestive) heart failure: Secondary | ICD-10-CM | POA: Diagnosis not present

## 2022-11-30 LAB — BASIC METABOLIC PANEL
Anion gap: 10 (ref 5–15)
BUN: 53 mg/dL — ABNORMAL HIGH (ref 8–23)
CO2: 32 mmol/L (ref 22–32)
Calcium: 9.4 mg/dL (ref 8.9–10.3)
Chloride: 95 mmol/L — ABNORMAL LOW (ref 98–111)
Creatinine, Ser: 1.79 mg/dL — ABNORMAL HIGH (ref 0.44–1.00)
GFR, Estimated: 31 mL/min — ABNORMAL LOW (ref >60)
Glucose, Bld: 103 mg/dL — ABNORMAL HIGH (ref 70–99)
Potassium: 4.3 mmol/L (ref 3.5–5.1)
Sodium: 137 mmol/L (ref 135–145)

## 2022-11-30 MED ORDER — POLYETHYLENE GLYCOL 3350 17 G PO PACK
17.0000 g | PACK | Freq: Every day | ORAL | Status: DC
  Administered 2022-11-30 – 2022-12-01 (×2): 17 g via ORAL
  Filled 2022-11-30 (×3): qty 1

## 2022-11-30 MED ORDER — FUROSEMIDE 40 MG PO TABS
40.0000 mg | ORAL_TABLET | Freq: Every day | ORAL | Status: DC
  Administered 2022-11-30 – 2022-12-02 (×3): 40 mg via ORAL
  Filled 2022-11-30 (×3): qty 1

## 2022-11-30 MED ORDER — SENNOSIDES-DOCUSATE SODIUM 8.6-50 MG PO TABS
1.0000 | ORAL_TABLET | Freq: Two times a day (BID) | ORAL | Status: DC
  Administered 2022-11-30 – 2022-12-02 (×4): 1 via ORAL
  Filled 2022-11-30 (×5): qty 1

## 2022-11-30 MED ORDER — SPIRONOLACTONE 25 MG PO TABS: 25.0000 mg | ORAL_TABLET | Freq: Every day | ORAL | Status: DC

## 2022-11-30 NOTE — Progress Notes (Signed)
Mobility Specialist Progress Note    11/30/22 0958  Mobility  Activity Ambulated with assistance in hallway  Level of Assistance Minimal assist, patient does 75% or more  Assistive Device Front wheel walker  Distance Ambulated (ft) 70 ft  Activity Response Tolerated well  Mobility Referral Yes  $Mobility charge 1 Mobility  Mobility Specialist Start Time (ACUTE ONLY) [id number]  Mobility Specialist Stop Time (ACUTE ONLY) 0957  Mobility Specialist Time Calculation (min) (ACUTE ONLY) 15 min   Pre-Mobility: 84 HR, 83% SpO2 During Mobility: 92 HR, 91% SpO2 Post-Mobility: 92% SpO2  Pt received in chair and agreeable. C/o R foot soreness and pain. Encouraged pursed lip breathing. Required 2LO2 to maintain SpO2 89-92%. Returned to sitting at sink for oral hygiene and wash-up. NT aware.   Marie Ramos Mobility Specialist  Please Neurosurgeon or Rehab Office at [phone]

## 2022-11-30 NOTE — Progress Notes (Signed)
PROGRESS NOTE    Marie Ramos  ZOX:[id number] DOB: 04-Sep-1955 DOA: 11/24/2022 PCP: Daisy Floro, MD  67/F with history of cardiac arrest, anoxic brain injury, CKD, diastolic CHF, bipolar disorder and obesity presented to the ED after mechanical fall.  History of frequent falls over the last 6 months, last fall 3 days PTA, limited mobility since then, prior to admission furosemide dose was increased due to volume overload however continues to be weak and deconditioned. -Vital signs stable, sodium 138, BUN 46, creatinine 1.7, mag 2.9, troponin 47, hemoglobin 10.3, D-dimer 1.9 -Head CT negative for acute changes.  -Chest x-ray noted cardiomegaly pulmonary vascular congestion  -Admitted, started on IV Lasix    Subjective: -Feels better overall, still on oxygen  Assessment and Plan:  Acute on chronic diastolic CHF Acute hypoxic respiratory failure -Echo with EF 55%, severe hypertrophy of the septal segment.  No outflow obstruction, RV systolic function is preserved.  -Sp ICD due to cardiac arrest.  -Diuresing on IV Lasix urine output inaccurate, at least 6 L negative, now changed to oral Lasix -Continue Jardiance and carvedilol, amlodipine -Creatinine higher than baseline, will avoid ARB/Aldactone  -Attempt to wean O2 -PT OT following, declined by CIR, discharge planning, SNF recommended, discharge planning, TOC following  Acute kidney injury superimposed on chronic kidney disease (HCC) CKD stage 3b.  -Baseline creatinine around 1.2, now stable in the 1.7 range -ARB discontinued, continue meds as above, oral Lasix  Debility/freq falls Tremors -PT/OT> CIR eval -High-dose ox carbamazepine likely contributing to frequent falls, tremors and unsteady gait, improved on lower dose  Anoxic brain damage (HCC) History of CVA with cognitive impairment. Continue aspirin and statin therapy.    Bipolar affective disorder in remission (HCC) Continue venlafaxine, oxcarbazepine, dose  decreased  Class 3 obesity (HCC) Calculated BMI is 47.6    DVT prophylaxis: lovenox Code Status: Full code Family Communication: Spouse at bedside yesterday Disposition Plan: SNF when bed available  Consultants:    Procedures:   Antimicrobials:    Objective: Vitals:   11/29/22 1653 11/29/22 1942 11/29/22 2351 11/30/22 0459  BP: 137/72 139/80 115/75 (!) 146/72  Pulse: 72 72 70 71  Resp: 18 17 17 18   Temp: 97.8 F (36.6 C) 98 F (36.7 C) 98.2 F (36.8 C) (!) 97.5 F (36.4 C)  TempSrc: Oral Oral Oral Oral  SpO2: 90% 92% 93% 93%  Weight:    114.5 kg  Height:        Intake/Output Summary (Last 24 hours) at 11/30/2022 1033 Last data filed at 11/30/2022 0500 Gross per 24 hour  Intake 565 ml  Output 1900 ml  Net -1335 ml   Filed Weights   11/28/22 0536 11/29/22 0450 11/30/22 0459  Weight: 119.4 kg 117.3 kg 114.5 kg    Examination:  Obese pleasant female sitting up in the recliner, AAOx3, no distress HEENT: Neck obese unable to assess JVD CVS: S1-S2, regular rhythm Lungs: Decreased breath sounds to bases Abdomen: Soft, nontender, bowel sounds present Extremities: No edema  Skin: no new rashes on exposed skin    Data Reviewed:   CBC: Recent Labs  Lab 11/24/22 1207 11/24/22 1219 11/25/22 0135 11/25/22 0505 11/27/22 0102  WBC 9.9  --   --   --  9.7  NEUTROABS 7.8*  --   --   --   --   HGB 10.3* 11.2*  11.2* 11.9* 12.2 10.6*  HCT 35.0* 33.0*  33.0* 35.0* 36.0 34.4*  MCV 106.7*  --   --   --  102.4*  PLT 451*  --   --   --  439*   Basic Metabolic Panel: Recent Labs  Lab 11/24/22 1207 11/24/22 1219 11/26/22 0116 11/27/22 0102 11/28/22 0051 11/29/22 0040 11/30/22 0053  NA 138   < > 137 136 137 137 137  K 4.9   < > 4.1 4.1 4.1 4.0 4.3  CL 101   < > 97* 92* 91* 91* 95*  CO2 28   < > 34* 35* 36* 36* 32  GLUCOSE 72   < > 99 90 97 95 103*  BUN 46*   < > 46* 46* 49* 50* 53*  CREATININE 1.71*   < > 1.67* 1.72* 1.72* 1.56* 1.79*  CALCIUM 8.9   < >  8.9 9.0 9.2 9.3 9.4  MG 2.9*  --  2.4  --   --   --   --    < > = values in this interval not displayed.   GFR: Estimated Creatinine Clearance: 36.5 mL/min (A) (by C-G formula based on SCr of 1.79 mg/dL (H)). Liver Function Tests: Recent Labs  Lab 11/24/22 1207 11/25/22 0126  AST 23 50*  ALT 24 49*  ALKPHOS 95 107  BILITOT 0.5 0.3  PROT 6.4* 6.9  ALBUMIN 2.8* 2.9*   No results for input(s): "LIPASE", "AMYLASE" in the last 168 hours. No results for input(s): "AMMONIA" in the last 168 hours. Coagulation Profile: No results for input(s): "INR", "PROTIME" in the last 168 hours. Cardiac Enzymes: No results for input(s): "CKTOTAL", "CKMB", "CKMBINDEX", "TROPONINI" in the last 168 hours. BNP (last 3 results) No results for input(s): "PROBNP" in the last 8760 hours. HbA1C: No results for input(s): "HGBA1C" in the last 72 hours.  CBG: No results for input(s): "GLUCAP" in the last 168 hours. Lipid Profile: No results for input(s): "CHOL", "HDL", "LDLCALC", "TRIG", "CHOLHDL", "LDLDIRECT" in the last 72 hours. Thyroid Function Tests: No results for input(s): "TSH", "T4TOTAL", "FREET4", "T3FREE", "THYROIDAB" in the last 72 hours. Anemia Panel: No results for input(s): "VITAMINB12", "FOLATE", "FERRITIN", "TIBC", "IRON", "RETICCTPCT" in the last 72 hours.  Urine analysis:    Component Value Date/Time   COLORURINE YELLOW 11/24/2022 1945   APPEARANCEUR HAZY (A) 11/24/2022 1945   LABSPEC 1.014 11/24/2022 1945   PHURINE 5.0 11/24/2022 1945   GLUCOSEU NEGATIVE 11/24/2022 1945   HGBUR NEGATIVE 11/24/2022 1945   BILIRUBINUR NEGATIVE 11/24/2022 1945   KETONESUR NEGATIVE 11/24/2022 1945   PROTEINUR 100 (A) 11/24/2022 1945   NITRITE NEGATIVE 11/24/2022 1945   LEUKOCYTESUR NEGATIVE 11/24/2022 1945   Sepsis Labs: @LABRCNTIP (procalcitonin:4,lacticidven:4)  )No results found for this or any previous visit (from the past 240 hour(s)).   Radiology Studies: No results found.   Scheduled  Meds:  amLODipine  5 mg Oral Daily   aspirin EC  81 mg Oral Daily   atorvastatin  20 mg Oral q1800   carvedilol  12.5 mg Oral BID WC   empagliflozin  10 mg Oral Daily   enoxaparin (LOVENOX) injection  60 mg Subcutaneous Daily   famotidine  20 mg Oral QHS   furosemide  40 mg Oral Daily   nystatin cream  1 Application Topical BID   Oxcarbazepine  300 mg Oral Daily   polyethylene glycol  17 g Oral Daily   senna-docusate  1 tablet Oral BID   venlafaxine XR  150 mg Oral QHS   venlafaxine XR  75 mg Oral Q breakfast   Continuous Infusions:   LOS: 6 days    Time spent:     Zannie Cove, MD [facility]   11/30/2022, 10:33 AM

## 2022-11-30 NOTE — Progress Notes (Signed)
Nurse requested Mobility Specialist to perform oxygen saturation test with pt which includes removing pt from oxygen both at rest and while ambulating.  Below are the results from that testing.     Patient Saturations on Room Air at Rest = spO2 83%  Performed pursed lip breathing for 1 minute with sp02 at 84%.  Patient Saturations on 2 Liters of oxygen while Ambulating = sp02 91%  At end of testing pt left in room on 2 Liters of oxygen.  Reported results to nurse.

## 2022-12-01 DIAGNOSIS — I5033 Acute on chronic diastolic (congestive) heart failure: Secondary | ICD-10-CM | POA: Diagnosis not present

## 2022-12-01 LAB — BASIC METABOLIC PANEL
Anion gap: 14 (ref 5–15)
BUN: 59 mg/dL — ABNORMAL HIGH (ref 8–23)
CO2: 33 mmol/L — ABNORMAL HIGH (ref 22–32)
Calcium: 9.5 mg/dL (ref 8.9–10.3)
Chloride: 93 mmol/L — ABNORMAL LOW (ref 98–111)
Creatinine, Ser: 1.88 mg/dL — ABNORMAL HIGH (ref 0.44–1.00)
GFR, Estimated: 29 mL/min — ABNORMAL LOW (ref >60)
Glucose, Bld: 106 mg/dL — ABNORMAL HIGH (ref 70–99)
Potassium: 4.2 mmol/L (ref 3.5–5.1)
Sodium: 140 mmol/L (ref 135–145)

## 2022-12-01 MED ORDER — OXCARBAZEPINE 300 MG PO TABS: 300.0000 mg | ORAL_TABLET | Freq: Every day | ORAL | 0 refills | Status: DC

## 2022-12-01 MED ORDER — FUROSEMIDE 40 MG PO TABS: 40.0000 mg | ORAL_TABLET | Freq: Every day | ORAL | 3 refills | Status: DC

## 2022-12-01 MED ORDER — EMPAGLIFLOZIN 10 MG PO TABS: 10.0000 mg | ORAL_TABLET | Freq: Every day | ORAL | Status: DC

## 2022-12-01 MED ORDER — POLYETHYLENE GLYCOL 3350 17 G PO PACK: 17.0000 g | PACK | Freq: Every day | ORAL | 0 refills | Status: AC

## 2022-12-01 MED ORDER — VENLAFAXINE HCL ER 150 MG PO CP24: 150.0000 mg | ORAL_CAPSULE | Freq: Every day | ORAL | 1 refills | Status: DC

## 2022-12-01 MED ORDER — BISACODYL 10 MG RE SUPP
10.0000 mg | Freq: Once | RECTAL | Status: AC
  Administered 2022-12-01: 10 mg via RECTAL
  Filled 2022-12-01: qty 1

## 2022-12-01 NOTE — Discharge Summary (Signed)
Physician Discharge Summary  Marie Ramos JXB:[id number] DOB: [birth year] DOA: 11/24/2022  PCP: Daisy Floro, MD  Admit date: 11/24/2022 Discharge date: 12/02/2022  Time spent: 35 minutes  Recommendations for Outpatient Follow-up:  PCP in 1 week CHF TOC clinic follow-up made for 6/18 Needs sleep study as outpatient   Discharge Diagnoses:  Principal Problem:   Acute on chronic diastolic CHF (congestive heart failure) (HCC) Active Problems:   Acute kidney injury superimposed on chronic kidney disease (HCC)   Anoxic brain damage (HCC)   Bipolar affective disorder in remission (HCC)   Class 3 obesity (HCC)   Discharge Condition: Improved  Diet recommendation: Low-sodium  Filed Weights   11/30/22 0459 12/01/22 0408 12/02/22 0614  Weight: 114.5 kg 114.5 kg 116.5 kg    History of present illness:  67/F with history of cardiac arrest, anoxic brain injury, CKD, diastolic CHF, bipolar disorder and obesity presented to the ED after mechanical fall.  History of frequent falls over the last 6 months, last fall 3 days PTA, limited mobility since then, prior to admission furosemide dose was increased due to volume overload however continues to be weak and deconditioned. -Vital signs stable, sodium 138, BUN 46, creatinine 1.7, mag 2.9, troponin 47, hemoglobin 10.3, D-dimer 1.9 -Head CT negative for acute changes.  -Chest x-ray noted cardiomegaly pulmonary vascular congestion   Hospital Course:   Acute on chronic diastolic CHF Acute hypoxic respiratory failure -Echo with EF 55%, severe hypertrophy of the septal segment.  No outflow obstruction, RV systolic function is preserved.  -Sp ICD due to cardiac arrest.  -Diuresing on IV Lasix urine output inaccurate, at least 7.5 L negative, started on Jardiance, transition to Lasix 40 Mg daily at discharge, continue carvedilol and low-dose amlodipine -Recommend additional Lasix as needed for weight gain -Creatinine higher than baseline,  will avoid ARB/Aldactone  -Did desat with activity, set up with home O2, SNF recommended for short-term rehab, needs sleep study after discharge -CHF TOC clinic follow-up made   Acute kidney injury superimposed on chronic kidney disease (HCC) CKD stage 3b.  -Baseline creatinine around 1.2, now stable in the 1.7-1.8 range -ARB discontinued, continue meds as above, oral Lasix   Debility/freq falls Tremors -PT/OT> CIR eval -High-dose ox carbamazepine likely contributing to frequent falls, tremors and unsteady gait, improved on lower dose   Anoxic brain damage (HCC) History of CVA with cognitive impairment. Continue aspirin and statin therapy.     Bipolar affective disorder in remission (HCC) Continue venlafaxine, oxcarbazepine, dose decreased   Class 3 obesity (HCC) Calculated BMI is 47.6   Discharge Exam: Vitals:   12/02/22 0614 12/02/22 0722  BP: (!) 153/80 (!) 142/79  Pulse: 79 75  Resp: 17 16  Temp: 97.7 F (36.5 C) 97.9 F (36.6 C)  SpO2: 93% 92%   Pleasant obese female sitting up in the recliner, AAOx3, no distress HEENT: Neck obese unable to assess JVD CVS: S1-S2, regular rhythm Lungs: Decreased breath sounds to bases Abdomen: Soft, nontender, bowel sounds present Extremities: No edema Skin: no new rashes on exposed skin   Discharge Instructions   Discharge Instructions     Amb Referral to Nutrition and Diabetic Education   Complete by: As directed       Allergies as of 12/02/2022       Reactions   Sulfa Antibiotics Itching   Face neck red   Albuterol Itching   Other Hives   Sulfamethoxazole-trimethoprim Hives   Vicodin Hp [hydrocodone-acetaminophen] Hives   Naproxen Itching  Vicodin [hydrocodone-acetaminophen] Itching        Medication List     STOP taking these medications    losartan 100 MG tablet Commonly known as: COZAAR   SKYRIZI (150 MG DOSE) [place]       TAKE these medications    ALPRAZolam 0.25 MG tablet Commonly known as:  XANAX Take one-half to one tablet by  mouth 3 times daily as needed for anxiety What changed: See the new instructions.   amLODipine 5 MG tablet Commonly known as: NORVASC Take 1 tablet (5 mg total) by mouth daily with breakfast.   aspirin EC 81 MG tablet Take 1 tablet (81 mg total) by mouth daily. Swallow whole.   atorvastatin 20 MG tablet Commonly known as: LIPITOR Take 1 tablet (20 mg total) by mouth daily at 6 PM.   carvedilol 12.5 MG tablet Commonly known as: COREG TAKE ONE TABLET BY MOUTH TWICE DAILY WITH BREAKFAST AND DINNER   cholecalciferol 25 MCG (1000 UNIT) tablet Commonly known as: VITAMIN D3 Take 2,000 Units by mouth daily.   empagliflozin 10 MG Tabs tablet Commonly known as: JARDIANCE Take 1 tablet (10 mg total) by mouth daily.   famotidine 20 MG tablet Commonly known as: PEPCID Take 20 mg by mouth at bedtime.   fexofenadine 180 MG tablet Commonly known as: ALLEGRA Take 180 mg by mouth daily.   fluticasone 50 MCG/ACT nasal spray Commonly known as: FLONASE Place 1 spray into both nostrils daily. Use as directed   furosemide 40 MG tablet Commonly known as: LASIX Take 1 tablet (40 mg total) by mouth daily. What changed:  when to take this additional instructions   Ginkgo Biloba 40 MG Tabs Take 1 tablet (40 mg total) by mouth 2 (two) times daily.   multivitamin with minerals Tabs tablet Take 1 tablet by mouth daily.   NAC 600 MG Caps Generic drug: Acetylcysteine Take 1 capsule (600 mg total) by mouth daily.   OCUVITE PO Take 1 capsule by mouth daily.   Oxcarbazepine 300 MG tablet Commonly known as: TRILEPTAL Take 1 tablet (300 mg total) by mouth daily. Take two tablets by mouth every morning, and take one tablet in the evening What changed:  how much to take how to take this when to take this   polyethylene glycol 17 g packet Commonly known as: MIRALAX / GLYCOLAX Take 17 g by mouth daily.   triamcinolone ointment 0.5 % Commonly known  as: KENALOG Apply 1 application topically as needed.   venlafaxine XR 75 MG 24 hr capsule Commonly known as: Effexor XR Take 1 capsule (75 mg total) by mouth daily with breakfast. Take with Effexor XR 300 mg What changed: Another medication with the same name was changed. Make sure you understand how and when to take each.   venlafaxine XR 150 MG 24 hr capsule Commonly known as: EFFEXOR-XR Take 1 capsule (150 mg total) by mouth at bedtime. What changed: how much to take   VITAMIN C PO Take 1 capsule by mouth daily.       Allergies  Allergen Reactions   Sulfa Antibiotics Itching    Face neck red   Albuterol Itching   Other Hives   Sulfamethoxazole-Trimethoprim Hives   Vicodin Hp [Hydrocodone-Acetaminophen] Hives   Naproxen Itching   Vicodin [Hydrocodone-Acetaminophen] Itching    Contact information for follow-up providers     [facility]. Go in 24 day(s).   Specialty: Cardiology Why: Hospital follow up 12/23/2022 PLEASE  bring a current medication list to appointment FREE valet parking , Entrance C, off [employer] information: [address] [id number] [address] [place] [address] [phone]             Contact information for after-discharge care     Destination     [facility] Preferred SNF .   Service: Skilled Nursing Contact information: [address] [place] [address] [phone]                      The results of significant diagnostics from this hospitalization (including imaging, microbiology, ancillary and laboratory) are listed below for reference.    Significant Diagnostic Studies: VAS [name] LOWER EXTREMITY VENOUS (DVT) (ONLY [facility] & [facility])  Result Date: 11/25/2022  Lower Venous DVT Study Patient Name:  Marie Ramos  Date of Exam:   11/24/2022 Medical Rec #: [id number]          Accession #:    [id number] Date of Birth: 1955/10/31           Patient Gender: F Patient Age:   19 years Exam Location:  [facility] Procedure:      VAS [name] LOWER EXTREMITY VENOUS (DVT) Referring Phys: MATTHEW TRIFAN --------------------------------------------------------------------------------  Indications: Edema.  Risk Factors: None identified. Limitations: Poor ultrasound/tissue interface. Comparison Study: No prior studies. Performing Technologist: Chanda Busing RVT  Examination Guidelines: A complete evaluation includes B-mode imaging, spectral Doppler, color Doppler, and power Doppler as needed of all accessible portions of each vessel. Bilateral testing is considered an integral part of a complete examination. Limited examinations for reoccurring indications may be performed as noted. The reflux portion of the exam is performed with the patient in reverse Trendelenburg.  +---------+---------------+---------+-----------+----------+--------------+ RIGHT    CompressibilityPhasicitySpontaneityPropertiesThrombus Aging +---------+---------------+---------+-----------+----------+--------------+ CFV      Full           Yes      Yes                                 +---------+---------------+---------+-----------+----------+--------------+ SFJ      Full                                                        +---------+---------------+---------+-----------+----------+--------------+ FV Prox  Full                                                        +---------+---------------+---------+-----------+----------+--------------+ FV Mid   Full                                                        +---------+---------------+---------+-----------+----------+--------------+ FV DistalFull                                                        +---------+---------------+---------+-----------+----------+--------------+  PFV      Full                                                         +---------+---------------+---------+-----------+----------+--------------+ POP      Full           Yes      Yes                                 +---------+---------------+---------+-----------+----------+--------------+ PTV      Full                                                        +---------+---------------+---------+-----------+----------+--------------+ PERO     Full                                                        +---------+---------------+---------+-----------+----------+--------------+   +---------+---------------+---------+-----------+----------+--------------+ LEFT     CompressibilityPhasicitySpontaneityPropertiesThrombus Aging +---------+---------------+---------+-----------+----------+--------------+ CFV      Full           Yes      Yes                                 +---------+---------------+---------+-----------+----------+--------------+ SFJ      Full                                                        +---------+---------------+---------+-----------+----------+--------------+ FV Prox  Full                                                        +---------+---------------+---------+-----------+----------+--------------+ FV Mid   Full                                                        +---------+---------------+---------+-----------+----------+--------------+ FV Distal               Yes      Yes                                 +---------+---------------+---------+-----------+----------+--------------+ PFV      Full                                                        +---------+---------------+---------+-----------+----------+--------------+  POP      Full           Yes      Yes                                 +---------+---------------+---------+-----------+----------+--------------+ PTV      Full                                                         +---------+---------------+---------+-----------+----------+--------------+ PERO     Full                                                        +---------+---------------+---------+-----------+----------+--------------+     Summary: RIGHT: - There is no evidence of deep vein thrombosis in the lower extremity.  - No cystic structure found in the popliteal fossa.  LEFT: - There is no evidence of deep vein thrombosis in the lower extremity. However, portions of this examination were limited- see technologist comments above.  - No cystic structure found in the popliteal fossa.  *See table(s) above for measurements and observations. Electronically signed by Coral Else MD on 11/25/2022 at 7:17:17 PM.    Final    NM Pulmonary Perfusion  Result Date: 11/25/2022 CLINICAL DATA:  Shortness of breath EXAM: NUCLEAR MEDICINE PERFUSION LUNG SCAN TECHNIQUE: Perfusion images were obtained in multiple projections after intravenous injection of radiopharmaceutical. Ventilation scans intentionally deferred if perfusion scan and chest x-ray adequate for interpretation during COVID 19 epidemic. RADIOPHARMACEUTICALS:  4.6 mCi Tc-78m MAA IV COMPARISON:  X-ray 11/24/2022 FINDINGS: Focal defect along the left hemithorax consistent with patient's battery pack for defibrillator. Mediastinal shadows enlarged consistent with patient's enlarged heart. Otherwise only a few small subsegmental peripheral defects identified. No larger defects seen on perfusion. IMPRESSION: Very low probability perfusion lung scan Electronically Signed   By: Karen Kays M.D.   On: 11/25/2022 12:43   CT HEAD WO CONTRAST  Result Date: 11/24/2022 CLINICAL DATA:  Syncope/presyncope EXAM: CT HEAD WITHOUT CONTRAST TECHNIQUE: Contiguous axial images were obtained from the base of the skull through the vertex without intravenous contrast. RADIATION DOSE REDUCTION: This exam was performed according to the departmental dose-optimization program which includes  automated exposure control, adjustment of the mA and/or kV according to patient size and/or use of iterative reconstruction technique. COMPARISON:  08/16/2019 FINDINGS: Brain: No acute intracranial findings are seen. Ventricles are not dilated. There are no signs of bleeding. There is no focal mass effect. Cortical sulci are prominent. Vascular: Coarse calcifications are seen arterial branches. Skull: No acute findings are seen. Sinuses/Orbits: There is mucosal thickening in the ethmoid sinus, especially in the posterior ethmoid sinus on the right side. Other: There is increased amount of fluid density in sella suggesting possible partial empty sella. IMPRESSION: No acute intracranial findings are seen in noncontrast CT brain. Electronically Signed   By: Ernie Avena M.D.   On: 11/24/2022 13:00   DG Knee 2 Views Right  Result Date: 11/24/2022 CLINICAL DATA:  Fall.  Bilateral anterior knee abrasions. EXAM: RIGHT KNEE - 1-2 VIEW COMPARISON:  None Available. FINDINGS: There is  diffuse decreased bone mineralization. Moderate to severe medial compartment joint space narrowing and mild-to-moderate peripheral osteophytosis. Mild peripheral lateral compartment degenerative osteophytosis without joint space narrowing. Moderate patellofemoral joint space narrowing and peripheral osteophytosis. A 5 mm well corticated chronic ossicle is seen inferior to the patella, overlying Hoffa's fat pad on lateral view. Small joint effusion. No acute fracture or dislocation. IMPRESSION: 1. Moderate-to-severe medial and moderate patellofemoral compartment osteoarthritis. 2. Small joint effusion. Electronically Signed   By: Neita Garnet M.D.   On: 11/24/2022 12:37   DG Knee 2 Views Left  Result Date: 11/24/2022 CLINICAL DATA:  Fall.  Bilateral knee anterior abrasions. EXAM: LEFT KNEE - 1-2 VIEW COMPARISON:  None Available. FINDINGS: There is diffuse decreased bone mineralization. Severe medial compartment joint space  narrowing with bone-on-bone contact, subchondral sclerosis, and moderate peripheral osteophytosis. Mild peripheral glenoid degenerative osteophytosis without joint space narrowing. Severe patellofemoral joint space narrowing and peripheral osteophytosis. Well-corticated chronic 15 mm loose body overlying the distal anterior femoral metaphyseal cortex and the suprapatellar joint space. Small joint effusion. Mild-to-moderate chronic enthesopathic change at the quadriceps insertion on the patella. No acute fracture or dislocation. IMPRESSION: 1. Severe medial and patellofemoral compartment osteoarthritis. 2. Small joint effusion. 3. Chronic 15 mm loose body within the suprapatellar joint space. Electronically Signed   By: Neita Garnet M.D.   On: 11/24/2022 12:36   DG Chest Port 1 View  Result Date: 11/24/2022 CLINICAL DATA:  Fall.  Shortness of breath. EXAM: PORTABLE CHEST 1 VIEW COMPARISON:  Chest radiographs 08/16/2019 and 09/04/2015 FINDINGS: Cardiac silhouette is moderately to markedly enlarged, stable to increased from 08/16/2019 most recent prior. Mediastinal contours are within normal limits. Calcification is again seen within the aortic arch. Left chest wall cardiac AICD with leads again overlying the right atrium and right ventricle. Mild-to-moderate bilateral interstitial thickening is mildly improved from 08/16/2019 and similar to 09/04/2015. No definite pleural effusion. No pneumothorax. Old healed bilateral rib fractures are grossly similar to prior. IMPRESSION: 1. Mild-to-moderate bilateral interstitial thickening, mildly improved from 08/16/2019 and similar to 09/04/2015. Findings may represent interstitial scarring. Improved, mild interstitial pulmonary edema is also possible. 2. Moderate to marked cardiomegaly, stable to increased from 08/16/2019. Electronically Signed   By: Neita Garnet M.D.   On: 11/24/2022 12:34   CUP PACEART REMOTE DEVICE CHECK  Result Date: 11/13/2022 Scheduled remote  reviewed. Normal device function.  Next remote 91 days. LA, CVRS   Microbiology: No results found for this or any previous visit (from the past 240 hour(s)).   Labs: Basic Metabolic Panel: Recent Labs  Lab 11/26/22 0116 11/27/22 0102 11/28/22 0051 11/29/22 0040 11/30/22 0053 12/01/22 0102 12/02/22 0036  NA 137   < > 137 137 137 140 138  K 4.1   < > 4.1 4.0 4.3 4.2 4.6  CL 97*   < > 91* 91* 95* 93* 95*  CO2 34*   < > 36* 36* 32 33* 33*  GLUCOSE 99   < > 97 95 103* 106* 99  BUN 46*   < > 49* 50* 53* 59* 55*  CREATININE 1.67*   < > 1.72* 1.56* 1.79* 1.88* 1.70*  CALCIUM 8.9   < > 9.2 9.3 9.4 9.5 9.5  MG 2.4  --   --   --   --   --   --    < > = values in this interval not displayed.   Liver Function Tests: No results for input(s): "AST", "ALT", "ALKPHOS", "BILITOT", "PROT", "ALBUMIN" in the  last 168 hours.  No results for input(s): "LIPASE", "AMYLASE" in the last 168 hours. No results for input(s): "AMMONIA" in the last 168 hours. CBC: Recent Labs  Lab 11/27/22 0102  WBC 9.7  HGB 10.6*  HCT 34.4*  MCV 102.4*  PLT 439*   Cardiac Enzymes: No results for input(s): "CKTOTAL", "CKMB", "CKMBINDEX", "TROPONINI" in the last 168 hours. BNP: BNP (last 3 results) Recent Labs    11/24/22 1959  BNP 120.2*    ProBNP (last 3 results) No results for input(s): "PROBNP" in the last 8760 hours.  CBG: No results for input(s): "GLUCAP" in the last 168 hours.     Signed:  Zannie Cove MD.  [facility] 12/02/2022, 9:23 AM

## 2022-12-01 NOTE — TOC Progression Note (Signed)
Transition of Care [facility]) - Progression Note    Patient Details  Name: Marie Ramos MRN: [id number] Date of Birth: Sep 02, 1955  Transition of [facility] CM/SW Contact  Delilah Shan, LCSWA Phone Number: 12/01/2022, 5:11 PM  Clinical Narrative:     CSW spoke with patient who confirmed SNF bed at [facility]. CSW will start insurance authorization for patient. CSW will continue to follow and assist with patients dc planning needs.  Expected Discharge Plan: Skilled Nursing Facility Barriers to Discharge: Continued Medical Work up  Expected Discharge Plan and Services       Living arrangements for the past 2 months: Single Family Home                                       Social Determinants of Health (SDOH) Interventions SDOH Screenings   Food Insecurity: No Food Insecurity (11/28/2022)  Housing: Low Risk  (11/28/2022)  Transportation Needs: No Transportation Needs (11/28/2022)  Alcohol Screen: Low Risk  (11/28/2022)  Depression (PHQ2-9): Low Risk  (08/05/2018)  Financial Resource Strain: Low Risk  (11/28/2022)  Physical Activity: Inactive (08/05/2018)  Social Connections: Socially Integrated (08/05/2018)  Stress: Stress Concern Present (08/05/2018)  Tobacco Use: Low Risk  (11/28/2022)    Readmission Risk Interventions     No data to display

## 2022-12-01 NOTE — Progress Notes (Signed)
Occupational Therapy Treatment Patient Details Name: Marie Ramos MRN: [id number] DOB: [birth year] Today's Date: 12/01/2022   History of present illness Pt is 67 yo female admitted on 11/24/22 with acute on chronic CHF, weakness, and difficulty with ambulation. Pt with hx including but not limited to cardiac arrest, anoxic brain injury due to cardiac arrest, CKD, heart failure, bipolar and obesity, CVA   OT comments  Pt making progress with functional goals. Pt seated in chair upon arrival requesting to use restroom. Pt stood form chair to RW min guard A and walked to bathroom using RW and transferred to toilet min guard A. Pt required min A to manage clothing , mod A for anterior hygiene. Pt stood at sink to wash and dry hands min guard A. Educated pt on use of toileting aid for home use with hanout provided. Pt's O2 SATs remained 90-92% on 2L O2 throughout activity. OT will continue to follow acutely to maximize level of function and safety.    Recommendations for follow up therapy are one component of a multi-disciplinary discharge planning process, led by the attending physician.  Recommendations may be updated based on patient status, additional functional criteria and insurance authorization.    Assistance Recommended at Discharge    Patient can return home with the following  A little help with walking and/or transfers;A little help with bathing/dressing/bathroom;Help with stairs or ramp for entrance;Assist for transportation;Assistance with feeding;Direct supervision/assist for medications management;Direct supervision/assist for financial management;Assistance with cooking/housework   Equipment Recommendations  Other (comment) (toileting aid, TBD at next venur of care)    Recommendations for Other Services      Precautions / Restrictions Precautions Precautions: Fall Restrictions Weight Bearing Restrictions: No       Mobility Bed Mobility               General bed  mobility comments: pt in chair upon arrival    Transfers Overall transfer level: Needs assistance Equipment used: Rolling walker (2 wheels) Transfers: Sit to/from Stand, Bed to chair/wheelchair/BSC Sit to Stand: Min guard     Step pivot transfers: Min guard           Balance Overall balance assessment: Needs assistance, History of Falls Sitting-balance support: Feet supported Sitting balance-Leahy Scale: Good     Standing balance support: Bilateral upper extremity supported, During functional activity Standing balance-Leahy Scale: Poor                             ADL either performed or assessed with clinical judgement   ADL Overall ADL's : Needs assistance/impaired     Grooming: Wash/dry hands;Wash/dry face;Min guard;Standing               Lower Body Dressing: Moderate assistance;Sit to/from stand   Toilet Transfer: Min guard;Ambulation;Rolling walker (2 wheels);Cueing for safety;Regular Toilet;Grab bars   Toileting- Clothing Manipulation and Hygiene: Moderate assistance;Sit to/from stand Toileting - Clothing Manipulation Details (indicate cue type and reason): clothing mgt min A, anterior hygiene mod A     Functional mobility during ADLs: Min guard;Rolling walker (2 wheels);Cueing for safety General ADL Comments: educated pt on use of toileting aid for home use with hanout provided    Extremity/Trunk Assessment Upper Extremity Assessment Upper Extremity Assessment: Generalized weakness            Vision Baseline Vision/History: 1 Wears glasses Ability to See in Adequate Light: 0 Adequate Patient Visual Report: No change from baseline  Perception     Praxis      Cognition Arousal/Alertness: Awake/alert Behavior During Therapy: WFL for tasks assessed/performed Overall Cognitive Status: Impaired/Different from baseline Area of Impairment: Safety/judgement, Awareness                         Safety/Judgement: Decreased  awareness of deficits     General Comments: requires repetitive instructions for safety during ADL mobility using RW and standing ADL tasks        Exercises      Shoulder Instructions       General Comments      Pertinent Vitals/ Pain          Home Living                                          Prior Functioning/Environment              Frequency  Min 2X/week        Progress Toward Goals  OT Goals(current goals can now be found in the care plan section)  Progress towards OT goals: Progressing toward goals     Plan Discharge plan needs to be updated;Frequency remains appropriate    Co-evaluation                 AM-PAC OT "6 Clicks" Daily Activity     Outcome Measure   Help from another person eating meals?: None Help from another person taking care of personal grooming?: A Little Help from another person toileting, which includes using toliet, bedpan, or urinal?: A Lot Help from another person bathing (including washing, rinsing, drying)?: A Little Help from another person to put on and taking off regular upper body clothing?: A Little Help from another person to put on and taking off regular lower body clothing?: A Little 6 Click Score: 18    End of Session Equipment Utilized During Treatment: Gait belt;Rolling walker (2 wheels)  OT Visit Diagnosis: Unsteadiness on feet (R26.81);Muscle weakness (generalized) (M62.81);Other abnormalities of gait and mobility (R26.89);Repeated falls (R29.6);History of falling (Z91.81);Other symptoms and signs involving cognitive function   Activity Tolerance Patient tolerated treatment well   Patient Left in chair;with call bell/phone within reach   Nurse Communication          Time: 1610-9604 OT Time Calculation (min): 24 min  Charges: OT General Charges $OT Visit: 1 Visit OT Treatments $Self Care/Home Management : 8-22 mins $Therapeutic Activity: 8-22 mins    Galen Manila 12/01/2022, 12:38 PM

## 2022-12-01 NOTE — Progress Notes (Signed)
Physical Therapy Treatment Patient Details Name: Marie Ramos MRN: [id number] DOB: 03-19-1956 Today's Date: 12/01/2022   History of Present Illness Pt is 67 yo female admitted on 11/24/22 with acute on chronic CHF, weakness, and difficulty with ambulation. Pt with hx including but not limited to cardiac arrest, anoxic brain injury due to cardiac arrest, CKD, heart failure, bipolar and obesity, CVA    PT Comments    Pt making steady progress. Amb distance limited to in room due to pt just received suppository and needing to go to bathroom. Continue to feel patient will benefit from continued inpatient follow up therapy, <3 hours/day    Recommendations for follow up therapy are one component of a multi-disciplinary discharge planning process, led by the attending physician.  Recommendations may be updated based on patient status, additional functional criteria and insurance authorization.  Follow Up Recommendations  Can patient physically be transported by private vehicle: Yes    Assistance Recommended at Discharge Frequent or constant Supervision/Assistance  Patient can return home with the following A little help with walking and/or transfers;A little help with bathing/dressing/bathroom;Assistance with cooking/housework;Assist for transportation;Help with stairs or ramp for entrance   Equipment Recommendations  None recommended by PT    Recommendations for Other Services       Precautions / Restrictions Precautions Precautions: Fall Restrictions Weight Bearing Restrictions: No     Mobility  Bed Mobility Overal bed mobility: Needs Assistance Bed Mobility: Supine to Sit     Supine to sit: Min assist     General bed mobility comments: Assist to pull up on hand to elevate trunk into sitting    Transfers Overall transfer level: Needs assistance Equipment used: Rolling walker (2 wheels) Transfers: Sit to/from Stand Sit to Stand: Min guard           General  transfer comment: Assist for safety    Ambulation/Gait Ambulation/Gait assistance: Min guard Gait Distance (Feet): 20 Feet (20' x 1, 15' x 1) Assistive device: Rolling walker (2 wheels) Gait Pattern/deviations: Step-through pattern, Decreased stride length, Wide base of support Gait velocity: decr Gait velocity interpretation: <1.31 ft/sec, indicative of household ambulator   General Gait Details: Assist for Futures trader    Modified Rankin (Stroke Patients Only)       Balance Overall balance assessment: Needs assistance, History of Falls Sitting-balance support: No upper extremity supported, Feet supported Sitting balance-Leahy Scale: Good     Standing balance support: Bilateral upper extremity supported, During functional activity Standing balance-Leahy Scale: Poor Standing balance comment: walker and min guard for static standing                            Cognition Arousal/Alertness: Awake/alert Behavior During Therapy: WFL for tasks assessed/performed Overall Cognitive Status: Impaired/Different from baseline Area of Impairment: Safety/judgement, Awareness                         Safety/Judgement: Decreased awareness of deficits Awareness: Intellectual            Exercises      General Comments        Pertinent Vitals/Pain Pain Assessment Pain Assessment: No/denies pain    Home Living                          Prior Function  PT Goals (current goals can now be found in the care plan section) Acute Rehab PT Goals Patient Stated Goal: to get walking and home Progress towards PT goals: Progressing toward goals    Frequency    Min 1X/week      PT Plan Current plan remains appropriate    Co-evaluation              AM-PAC PT "6 Clicks" Mobility   Outcome Measure  Help needed turning from your back to your side while in a flat bed without using  bedrails?: A Little Help needed moving from lying on your back to sitting on the side of a flat bed without using bedrails?: A Little Help needed moving to and from a bed to a chair (including a wheelchair)?: A Little Help needed standing up from a chair using your arms (e.g., wheelchair or bedside chair)?: A Little Help needed to walk in hospital room?: A Little Help needed climbing 3-5 steps with a railing? : Total 6 Click Score: 16    End of Session Equipment Utilized During Treatment: Oxygen Activity Tolerance: Patient tolerated treatment well Patient left: in chair;with call bell/phone within reach;with chair alarm set;with family/visitor present Nurse Communication: Mobility status PT Visit Diagnosis: Other abnormalities of gait and mobility (R26.89);Muscle weakness (generalized) (M62.81)     Time: 1610-9604 PT Time Calculation (min) (ACUTE ONLY): 20 min  Charges:  $Gait Training: 8-22 mins                     [facility] PT Acute Rehabilitation Services Office [phone]    Angelina Ok [facility] 12/01/2022, 4:04 PM

## 2022-12-02 DIAGNOSIS — F330 Major depressive disorder, recurrent, mild: Secondary | ICD-10-CM | POA: Diagnosis not present

## 2022-12-02 DIAGNOSIS — R531 Weakness: Secondary | ICD-10-CM | POA: Diagnosis not present

## 2022-12-02 DIAGNOSIS — E785 Hyperlipidemia, unspecified: Secondary | ICD-10-CM | POA: Diagnosis not present

## 2022-12-02 DIAGNOSIS — Z9981 Dependence on supplemental oxygen: Secondary | ICD-10-CM | POA: Diagnosis not present

## 2022-12-02 DIAGNOSIS — J9600 Acute respiratory failure, unspecified whether with hypoxia or hypercapnia: Secondary | ICD-10-CM | POA: Diagnosis not present

## 2022-12-02 DIAGNOSIS — Z87820 Personal history of traumatic brain injury: Secondary | ICD-10-CM | POA: Diagnosis not present

## 2022-12-02 DIAGNOSIS — I6930 Unspecified sequelae of cerebral infarction: Secondary | ICD-10-CM | POA: Diagnosis not present

## 2022-12-02 DIAGNOSIS — K1120 Sialoadenitis, unspecified: Secondary | ICD-10-CM | POA: Diagnosis not present

## 2022-12-02 DIAGNOSIS — I5033 Acute on chronic diastolic (congestive) heart failure: Secondary | ICD-10-CM | POA: Diagnosis not present

## 2022-12-02 DIAGNOSIS — L853 Xerosis cutis: Secondary | ICD-10-CM | POA: Diagnosis not present

## 2022-12-02 DIAGNOSIS — R634 Abnormal weight loss: Secondary | ICD-10-CM | POA: Diagnosis not present

## 2022-12-02 DIAGNOSIS — Z79899 Other long term (current) drug therapy: Secondary | ICD-10-CM | POA: Diagnosis not present

## 2022-12-02 DIAGNOSIS — R4189 Other symptoms and signs involving cognitive functions and awareness: Secondary | ICD-10-CM | POA: Diagnosis not present

## 2022-12-02 DIAGNOSIS — Z7984 Long term (current) use of oral hypoglycemic drugs: Secondary | ICD-10-CM | POA: Diagnosis not present

## 2022-12-02 DIAGNOSIS — N1832 Chronic kidney disease, stage 3b: Secondary | ICD-10-CM | POA: Diagnosis not present

## 2022-12-02 DIAGNOSIS — I421 Obstructive hypertrophic cardiomyopathy: Secondary | ICD-10-CM | POA: Diagnosis not present

## 2022-12-02 DIAGNOSIS — E559 Vitamin D deficiency, unspecified: Secondary | ICD-10-CM | POA: Diagnosis not present

## 2022-12-02 DIAGNOSIS — M6281 Muscle weakness (generalized): Secondary | ICD-10-CM | POA: Diagnosis not present

## 2022-12-02 DIAGNOSIS — Z8679 Personal history of other diseases of the circulatory system: Secondary | ICD-10-CM | POA: Diagnosis not present

## 2022-12-02 DIAGNOSIS — I5032 Chronic diastolic (congestive) heart failure: Secondary | ICD-10-CM | POA: Diagnosis not present

## 2022-12-02 DIAGNOSIS — Z9181 History of falling: Secondary | ICD-10-CM | POA: Diagnosis not present

## 2022-12-02 DIAGNOSIS — Z1383 Encounter for screening for respiratory disorder NEC: Secondary | ICD-10-CM | POA: Diagnosis not present

## 2022-12-02 DIAGNOSIS — Z87440 Personal history of urinary (tract) infections: Secondary | ICD-10-CM | POA: Diagnosis not present

## 2022-12-02 DIAGNOSIS — J9601 Acute respiratory failure with hypoxia: Secondary | ICD-10-CM | POA: Diagnosis not present

## 2022-12-02 DIAGNOSIS — F319 Bipolar disorder, unspecified: Secondary | ICD-10-CM | POA: Diagnosis not present

## 2022-12-02 DIAGNOSIS — Z8616 Personal history of COVID-19: Secondary | ICD-10-CM | POA: Diagnosis not present

## 2022-12-02 DIAGNOSIS — N1831 Chronic kidney disease, stage 3a: Secondary | ICD-10-CM | POA: Diagnosis not present

## 2022-12-02 DIAGNOSIS — I509 Heart failure, unspecified: Secondary | ICD-10-CM | POA: Diagnosis not present

## 2022-12-02 DIAGNOSIS — R0609 Other forms of dyspnea: Secondary | ICD-10-CM | POA: Diagnosis not present

## 2022-12-02 DIAGNOSIS — F5105 Insomnia due to other mental disorder: Secondary | ICD-10-CM | POA: Diagnosis not present

## 2022-12-02 DIAGNOSIS — I130 Hypertensive heart and chronic kidney disease with heart failure and stage 1 through stage 4 chronic kidney disease, or unspecified chronic kidney disease: Secondary | ICD-10-CM | POA: Diagnosis not present

## 2022-12-02 DIAGNOSIS — I10 Essential (primary) hypertension: Secondary | ICD-10-CM | POA: Diagnosis not present

## 2022-12-02 DIAGNOSIS — R413 Other amnesia: Secondary | ICD-10-CM | POA: Diagnosis not present

## 2022-12-02 DIAGNOSIS — G4700 Insomnia, unspecified: Secondary | ICD-10-CM | POA: Diagnosis not present

## 2022-12-02 DIAGNOSIS — R41841 Cognitive communication deficit: Secondary | ICD-10-CM | POA: Diagnosis not present

## 2022-12-02 DIAGNOSIS — Z7401 Bed confinement status: Secondary | ICD-10-CM | POA: Diagnosis not present

## 2022-12-02 DIAGNOSIS — N390 Urinary tract infection, site not specified: Secondary | ICD-10-CM | POA: Diagnosis not present

## 2022-12-02 DIAGNOSIS — R0602 Shortness of breath: Secondary | ICD-10-CM | POA: Diagnosis not present

## 2022-12-02 DIAGNOSIS — F411 Generalized anxiety disorder: Secondary | ICD-10-CM | POA: Diagnosis not present

## 2022-12-02 DIAGNOSIS — Z8673 Personal history of transient ischemic attack (TIA), and cerebral infarction without residual deficits: Secondary | ICD-10-CM | POA: Diagnosis not present

## 2022-12-02 DIAGNOSIS — F419 Anxiety disorder, unspecified: Secondary | ICD-10-CM | POA: Diagnosis not present

## 2022-12-02 DIAGNOSIS — R601 Generalized edema: Secondary | ICD-10-CM | POA: Diagnosis not present

## 2022-12-02 DIAGNOSIS — K5900 Constipation, unspecified: Secondary | ICD-10-CM | POA: Diagnosis not present

## 2022-12-02 DIAGNOSIS — R410 Disorientation, unspecified: Secondary | ICD-10-CM | POA: Diagnosis not present

## 2022-12-02 DIAGNOSIS — I69811 Memory deficit following other cerebrovascular disease: Secondary | ICD-10-CM | POA: Diagnosis not present

## 2022-12-02 DIAGNOSIS — G931 Anoxic brain damage, not elsewhere classified: Secondary | ICD-10-CM | POA: Diagnosis not present

## 2022-12-02 DIAGNOSIS — L089 Local infection of the skin and subcutaneous tissue, unspecified: Secondary | ICD-10-CM | POA: Diagnosis not present

## 2022-12-02 DIAGNOSIS — J302 Other seasonal allergic rhinitis: Secondary | ICD-10-CM | POA: Diagnosis not present

## 2022-12-02 DIAGNOSIS — R600 Localized edema: Secondary | ICD-10-CM | POA: Diagnosis not present

## 2022-12-02 DIAGNOSIS — K219 Gastro-esophageal reflux disease without esophagitis: Secondary | ICD-10-CM | POA: Diagnosis not present

## 2022-12-02 LAB — BASIC METABOLIC PANEL WITH GFR
Anion gap: 10 (ref 5–15)
BUN: 55 mg/dL — ABNORMAL HIGH (ref 8–23)
CO2: 33 mmol/L — ABNORMAL HIGH (ref 22–32)
Calcium: 9.5 mg/dL (ref 8.9–10.3)
Chloride: 95 mmol/L — ABNORMAL LOW (ref 98–111)
Creatinine, Ser: 1.7 mg/dL — ABNORMAL HIGH (ref 0.44–1.00)
GFR, Estimated: 33 mL/min — ABNORMAL LOW
Glucose, Bld: 99 mg/dL (ref 70–99)
Potassium: 4.6 mmol/L (ref 3.5–5.1)
Sodium: 138 mmol/L (ref 135–145)

## 2022-12-02 NOTE — Care Management Important Message (Signed)
Important Message  Patient Details  Name: Marie Ramos MRN: [id number] Date of Birth: March 04, 1956   Medicare Important Message Given:  Yes     Renie Ora 12/02/2022, 11:59 AM

## 2022-12-02 NOTE — Progress Notes (Signed)
   Heart Failure Stewardship Pharmacist Progress Note   PCP: Daisy Floro, MD PCP-Cardiologist: Orbie Pyo, MD    HPI:  67 yo F with PMH of CHF, HTN, CVA, TBI, anoxic brain injury, HLD and DVT.   She presented to the ED on 5/20 with bilateral knee pain and decreased mobility after several falls. Reports that she has not been able to get up from the couch and her family was bringing her a bedpan. CXR with mild to moderate bilateral interstitial thickening representing scarring vs pulmonary edema. Last ECHO from 08/2022 showed LVEF 55%, severe LVH, G1DD, and RV normal. Screened for CIR and now recommended for SNF at discharge.  Discharge HF Medications: Diuretic: furosemide 40 mg daily Beta Blocker: carvedilol 12.5 mg BID SGLT2i: Jardiance 10 mg daily  Prior to admission HF Medications: Diuretic: furosemide 40 mg BID Beta blocker: carvedilol 12.5 mg BID ACE/ARB/ARNI: losartan 100 mg daily  Pertinent Lab Values: Serum creatinine 1.70, BUN 55, Potassium 4.6, Sodium 138, BNP 120.2, Magnesium 2.4, A1c 6.2   Vital Signs: Weight: 256 lbs (admission weight: 260 lbs) Blood pressure: 140/70s  Heart rate: 70-80s  I/O: -1L yesterday; net -8L  Medication Assistance / Insurance Benefits Check: Does the patient have prescription insurance?  Yes Type of insurance plan: [employer]  Outpatient Pharmacy:  Prior to admission outpatient pharmacy: [facility] and mail order Is the patient willing to use [facility] TOC pharmacy at discharge? Yes Is the patient willing to transition their outpatient pharmacy to utilize a [facility] outpatient pharmacy?   No    Assessment: 1. Acute on chronic diastolic CHF (LVEF 55%). NYHA class II symptoms. - SCr improved 1.88>1.70. Baseline appears to be ~ 1.5 - Continue furosemide 40 mg daily. Strict I/Os and daily weights. Keep K>4 and Mg>2.  - Continue carvedilol 12.5 mg BID - Consider restarting losartan and add MRA at follow up when creatinine  stable - Continue Jardiance 10 mg daily   Plan: 1) Medication changes recommended at this time: - Continue current regimen  2) Patient assistance: - None pending; SNF at discharge - Jardiance copay $45  3)  Education  - Patient has been educated on current HF medications and potential additions to HF medication regimen - Patient verbalizes understanding that over the next few months, these medication doses may change and more medications may be added to optimize HF regimen - Patient has been educated on basic disease state pathophysiology and goals of therapy   Sharen Hones, PharmD, BCPS Heart Failure Stewardship Pharmacist Phone [phone]

## 2022-12-02 NOTE — TOC Transition Note (Signed)
Transition of Care [facility]) - CM/SW Discharge Note   Patient Details  Name: Marie Ramos MRN: [id number] Date of Birth: 1956/01/10  Transition of [facility]) CM/SW Contact:  Leander Rams, LCSW Phone Number: 12/02/2022, 10:27 AM   Clinical Narrative:    Patient will DC to: [facility] SNF Anticipated DC date: 12/02/2022 Family notified: Efraim Kaufmann Transport by: Sharin Mons   Per MD patient ready for DC to [facility]. RN, patient, patient's family, and facility notified of DC. Discharge Summary and FL2 sent to facility. RN to call report prior to discharge [phone]. DC packet on chart. Ambulance transport requested for patient.   CSW will sign off for now as social work intervention is no longer needed. Please consult [name] again if new needs arise.    Final next level of care: Skilled Nursing Facility Barriers to Discharge: No Barriers Identified   Patient Goals and CMS Choice      Discharge Placement                Patient chooses bed at: [facility] Patient to be transferred to facility by: [facility] Name of family member notified: [name] Patient and family notified of of transfer: 12/02/22  Discharge Plan and Services Additional resources added to the After Visit Summary for                                       Social Determinants of Health (SDOH) Interventions SDOH Screenings   Food Insecurity: No Food Insecurity (11/28/2022)  Housing: Low Risk  (11/28/2022)  Transportation Needs: No Transportation Needs (11/28/2022)  Alcohol Screen: Low Risk  (11/28/2022)  Depression (PHQ2-9): Low Risk  (08/05/2018)  Financial Resource Strain: Low Risk  (11/28/2022)  Physical Activity: Inactive (08/05/2018)  Social Connections: Socially Integrated (08/05/2018)  Stress: Stress Concern Present (08/05/2018)  Tobacco Use: Low Risk  (11/28/2022)     Readmission Risk Interventions     No data to display          Oletta Lamas, MSW, LCSWA,  LCASA Transitions of Care  Clinical Social Worker I

## 2022-12-02 NOTE — TOC Progression Note (Signed)
Transition of Care [facility]) - Progression Note    Patient Details  Name: Marie Ramos MRN: [id number] Date of Birth: 12/12/1955  Transition [facility] Care [facility]) CM/SW Contact  Leander Rams, LCSW Phone Number: 12/02/2022, 9:20 AM  Clinical Narrative:    [name] is [phone] E and insurance auth for [facility] SNF has been received.   TOC will continue to follow.    Expected Discharge Plan: Skilled Nursing Facility Barriers to Discharge: Continued Medical Work up  Expected Discharge Plan and Services       Living arrangements for the past 2 months: Single Family Home                                       Social Determinants of Health (SDOH) Interventions SDOH Screenings   Food Insecurity: No Food Insecurity (11/28/2022)  Housing: Low Risk  (11/28/2022)  Transportation Needs: No Transportation Needs (11/28/2022)  Alcohol Screen: Low Risk  (11/28/2022)  Depression (PHQ2-9): Low Risk  (08/05/2018)  Financial Resource Strain: Low Risk  (11/28/2022)  Physical Activity: Inactive (08/05/2018)  Social Connections: Socially Integrated (08/05/2018)  Stress: Stress Concern Present (08/05/2018)  Tobacco Use: Low Risk  (11/28/2022)    Readmission Risk Interventions     No data to display        Oletta Lamas, MSW, LCSWA, LCASA Transitions of Care  Clinical Social Worker I

## 2022-12-03 DIAGNOSIS — F419 Anxiety disorder, unspecified: Secondary | ICD-10-CM | POA: Diagnosis not present

## 2022-12-03 DIAGNOSIS — Z8673 Personal history of transient ischemic attack (TIA), and cerebral infarction without residual deficits: Secondary | ICD-10-CM | POA: Diagnosis not present

## 2022-12-03 DIAGNOSIS — E559 Vitamin D deficiency, unspecified: Secondary | ICD-10-CM | POA: Diagnosis not present

## 2022-12-03 DIAGNOSIS — K5900 Constipation, unspecified: Secondary | ICD-10-CM | POA: Diagnosis not present

## 2022-12-03 DIAGNOSIS — K219 Gastro-esophageal reflux disease without esophagitis: Secondary | ICD-10-CM | POA: Diagnosis not present

## 2022-12-03 DIAGNOSIS — L853 Xerosis cutis: Secondary | ICD-10-CM | POA: Diagnosis not present

## 2022-12-03 DIAGNOSIS — R531 Weakness: Secondary | ICD-10-CM | POA: Diagnosis not present

## 2022-12-03 DIAGNOSIS — I509 Heart failure, unspecified: Secondary | ICD-10-CM | POA: Diagnosis not present

## 2022-12-03 DIAGNOSIS — Z9181 History of falling: Secondary | ICD-10-CM | POA: Diagnosis not present

## 2022-12-03 DIAGNOSIS — G931 Anoxic brain damage, not elsewhere classified: Secondary | ICD-10-CM | POA: Diagnosis not present

## 2022-12-03 DIAGNOSIS — J302 Other seasonal allergic rhinitis: Secondary | ICD-10-CM | POA: Diagnosis not present

## 2022-12-04 DIAGNOSIS — R531 Weakness: Secondary | ICD-10-CM | POA: Diagnosis not present

## 2022-12-04 DIAGNOSIS — F330 Major depressive disorder, recurrent, mild: Secondary | ICD-10-CM | POA: Diagnosis not present

## 2022-12-04 DIAGNOSIS — G931 Anoxic brain damage, not elsewhere classified: Secondary | ICD-10-CM | POA: Diagnosis not present

## 2022-12-04 DIAGNOSIS — Z9181 History of falling: Secondary | ICD-10-CM | POA: Diagnosis not present

## 2022-12-05 DIAGNOSIS — K219 Gastro-esophageal reflux disease without esophagitis: Secondary | ICD-10-CM | POA: Diagnosis not present

## 2022-12-05 DIAGNOSIS — E559 Vitamin D deficiency, unspecified: Secondary | ICD-10-CM | POA: Diagnosis not present

## 2022-12-05 DIAGNOSIS — K5900 Constipation, unspecified: Secondary | ICD-10-CM | POA: Diagnosis not present

## 2022-12-05 DIAGNOSIS — Z9181 History of falling: Secondary | ICD-10-CM | POA: Diagnosis not present

## 2022-12-05 DIAGNOSIS — I509 Heart failure, unspecified: Secondary | ICD-10-CM | POA: Diagnosis not present

## 2022-12-05 DIAGNOSIS — J302 Other seasonal allergic rhinitis: Secondary | ICD-10-CM | POA: Diagnosis not present

## 2022-12-05 DIAGNOSIS — R531 Weakness: Secondary | ICD-10-CM | POA: Diagnosis not present

## 2022-12-05 DIAGNOSIS — F419 Anxiety disorder, unspecified: Secondary | ICD-10-CM | POA: Diagnosis not present

## 2022-12-05 DIAGNOSIS — Z8673 Personal history of transient ischemic attack (TIA), and cerebral infarction without residual deficits: Secondary | ICD-10-CM | POA: Diagnosis not present

## 2022-12-06 DIAGNOSIS — R410 Disorientation, unspecified: Secondary | ICD-10-CM | POA: Diagnosis not present

## 2022-12-06 DIAGNOSIS — Z87440 Personal history of urinary (tract) infections: Secondary | ICD-10-CM | POA: Diagnosis not present

## 2022-12-06 DIAGNOSIS — N1832 Chronic kidney disease, stage 3b: Secondary | ICD-10-CM | POA: Diagnosis not present

## 2022-12-06 DIAGNOSIS — R531 Weakness: Secondary | ICD-10-CM | POA: Diagnosis not present

## 2022-12-09 DIAGNOSIS — G4700 Insomnia, unspecified: Secondary | ICD-10-CM | POA: Diagnosis not present

## 2022-12-09 DIAGNOSIS — R600 Localized edema: Secondary | ICD-10-CM | POA: Diagnosis not present

## 2022-12-09 DIAGNOSIS — F419 Anxiety disorder, unspecified: Secondary | ICD-10-CM | POA: Diagnosis not present

## 2022-12-09 DIAGNOSIS — F319 Bipolar disorder, unspecified: Secondary | ICD-10-CM | POA: Diagnosis not present

## 2022-12-09 DIAGNOSIS — Z9981 Dependence on supplemental oxygen: Secondary | ICD-10-CM | POA: Diagnosis not present

## 2022-12-10 ENCOUNTER — Telehealth: Payer: Self-pay | Admitting: Internal Medicine

## 2022-12-10 DIAGNOSIS — I6930 Unspecified sequelae of cerebral infarction: Secondary | ICD-10-CM | POA: Diagnosis not present

## 2022-12-10 DIAGNOSIS — F419 Anxiety disorder, unspecified: Secondary | ICD-10-CM | POA: Diagnosis not present

## 2022-12-10 DIAGNOSIS — G4700 Insomnia, unspecified: Secondary | ICD-10-CM | POA: Diagnosis not present

## 2022-12-10 NOTE — Telephone Encounter (Signed)
Has follow up scheduled w [facility] and Dr. Lynnette Caffey this month.

## 2022-12-10 NOTE — Telephone Encounter (Signed)
New Message:    Patient's daughter wanted to be sure that Dr Lynnette Caffey know that the patient was in the hospital from 11-24-22 until 12-02-22. After that she was admitted to [facility].

## 2022-12-12 DIAGNOSIS — R531 Weakness: Secondary | ICD-10-CM | POA: Diagnosis not present

## 2022-12-12 DIAGNOSIS — F419 Anxiety disorder, unspecified: Secondary | ICD-10-CM | POA: Diagnosis not present

## 2022-12-12 DIAGNOSIS — R4189 Other symptoms and signs involving cognitive functions and awareness: Secondary | ICD-10-CM | POA: Diagnosis not present

## 2022-12-12 DIAGNOSIS — I6930 Unspecified sequelae of cerebral infarction: Secondary | ICD-10-CM | POA: Diagnosis not present

## 2022-12-13 DIAGNOSIS — R531 Weakness: Secondary | ICD-10-CM | POA: Diagnosis not present

## 2022-12-13 DIAGNOSIS — Z9981 Dependence on supplemental oxygen: Secondary | ICD-10-CM | POA: Diagnosis not present

## 2022-12-13 DIAGNOSIS — F419 Anxiety disorder, unspecified: Secondary | ICD-10-CM | POA: Diagnosis not present

## 2022-12-13 DIAGNOSIS — Z8673 Personal history of transient ischemic attack (TIA), and cerebral infarction without residual deficits: Secondary | ICD-10-CM | POA: Diagnosis not present

## 2022-12-13 DIAGNOSIS — R4189 Other symptoms and signs involving cognitive functions and awareness: Secondary | ICD-10-CM | POA: Diagnosis not present

## 2022-12-14 DIAGNOSIS — F419 Anxiety disorder, unspecified: Secondary | ICD-10-CM | POA: Diagnosis not present

## 2022-12-14 DIAGNOSIS — Z9981 Dependence on supplemental oxygen: Secondary | ICD-10-CM | POA: Diagnosis not present

## 2022-12-16 DIAGNOSIS — L089 Local infection of the skin and subcutaneous tissue, unspecified: Secondary | ICD-10-CM | POA: Diagnosis not present

## 2022-12-16 DIAGNOSIS — J9601 Acute respiratory failure with hypoxia: Secondary | ICD-10-CM | POA: Diagnosis not present

## 2022-12-16 DIAGNOSIS — R531 Weakness: Secondary | ICD-10-CM | POA: Diagnosis not present

## 2022-12-16 DIAGNOSIS — R600 Localized edema: Secondary | ICD-10-CM | POA: Diagnosis not present

## 2022-12-18 DIAGNOSIS — F419 Anxiety disorder, unspecified: Secondary | ICD-10-CM | POA: Diagnosis not present

## 2022-12-18 DIAGNOSIS — L089 Local infection of the skin and subcutaneous tissue, unspecified: Secondary | ICD-10-CM | POA: Diagnosis not present

## 2022-12-18 DIAGNOSIS — R531 Weakness: Secondary | ICD-10-CM | POA: Diagnosis not present

## 2022-12-18 DIAGNOSIS — Z1383 Encounter for screening for respiratory disorder NEC: Secondary | ICD-10-CM | POA: Diagnosis not present

## 2022-12-18 DIAGNOSIS — F330 Major depressive disorder, recurrent, mild: Secondary | ICD-10-CM | POA: Diagnosis not present

## 2022-12-20 DIAGNOSIS — Z8673 Personal history of transient ischemic attack (TIA), and cerebral infarction without residual deficits: Secondary | ICD-10-CM | POA: Diagnosis not present

## 2022-12-20 DIAGNOSIS — R531 Weakness: Secondary | ICD-10-CM | POA: Diagnosis not present

## 2022-12-20 DIAGNOSIS — K1120 Sialoadenitis, unspecified: Secondary | ICD-10-CM | POA: Diagnosis not present

## 2022-12-22 DIAGNOSIS — I509 Heart failure, unspecified: Secondary | ICD-10-CM | POA: Diagnosis not present

## 2022-12-22 DIAGNOSIS — R634 Abnormal weight loss: Secondary | ICD-10-CM | POA: Diagnosis not present

## 2022-12-22 DIAGNOSIS — R531 Weakness: Secondary | ICD-10-CM | POA: Diagnosis not present

## 2022-12-22 DIAGNOSIS — K1120 Sialoadenitis, unspecified: Secondary | ICD-10-CM | POA: Diagnosis not present

## 2022-12-23 ENCOUNTER — Encounter (HOSPITAL_COMMUNITY): Payer: Self-pay

## 2022-12-23 ENCOUNTER — Ambulatory Visit (HOSPITAL_COMMUNITY)
Admit: 2022-12-23 | Discharge: 2022-12-23 | Disposition: A | Discharge status: Home / Self Care | Payer: Medicare HMO | Source: Ambulatory Visit | Attending: Adult Health | Admitting: Adult Health

## 2022-12-23 VITALS — BP 137/76 | HR 78 | Wt 251.8 lb

## 2022-12-23 DIAGNOSIS — Z8616 Personal history of COVID-19: Secondary | ICD-10-CM | POA: Diagnosis not present

## 2022-12-23 DIAGNOSIS — F319 Bipolar disorder, unspecified: Secondary | ICD-10-CM | POA: Diagnosis not present

## 2022-12-23 DIAGNOSIS — K1120 Sialoadenitis, unspecified: Secondary | ICD-10-CM | POA: Diagnosis not present

## 2022-12-23 DIAGNOSIS — Z8679 Personal history of other diseases of the circulatory system: Secondary | ICD-10-CM | POA: Insufficient documentation

## 2022-12-23 DIAGNOSIS — Z8673 Personal history of transient ischemic attack (TIA), and cerebral infarction without residual deficits: Secondary | ICD-10-CM | POA: Diagnosis not present

## 2022-12-23 DIAGNOSIS — E785 Hyperlipidemia, unspecified: Secondary | ICD-10-CM | POA: Diagnosis not present

## 2022-12-23 DIAGNOSIS — Z79899 Other long term (current) drug therapy: Secondary | ICD-10-CM | POA: Diagnosis not present

## 2022-12-23 DIAGNOSIS — N1831 Chronic kidney disease, stage 3a: Secondary | ICD-10-CM | POA: Diagnosis not present

## 2022-12-23 DIAGNOSIS — I10 Essential (primary) hypertension: Secondary | ICD-10-CM | POA: Diagnosis not present

## 2022-12-23 DIAGNOSIS — R600 Localized edema: Secondary | ICD-10-CM | POA: Diagnosis not present

## 2022-12-23 DIAGNOSIS — R0602 Shortness of breath: Secondary | ICD-10-CM | POA: Insufficient documentation

## 2022-12-23 DIAGNOSIS — I5032 Chronic diastolic (congestive) heart failure: Secondary | ICD-10-CM | POA: Diagnosis not present

## 2022-12-23 DIAGNOSIS — N1832 Chronic kidney disease, stage 3b: Secondary | ICD-10-CM | POA: Diagnosis not present

## 2022-12-23 DIAGNOSIS — I509 Heart failure, unspecified: Secondary | ICD-10-CM | POA: Diagnosis not present

## 2022-12-23 DIAGNOSIS — I130 Hypertensive heart and chronic kidney disease with heart failure and stage 1 through stage 4 chronic kidney disease, or unspecified chronic kidney disease: Secondary | ICD-10-CM | POA: Diagnosis not present

## 2022-12-23 DIAGNOSIS — Z7984 Long term (current) use of oral hypoglycemic drugs: Secondary | ICD-10-CM | POA: Insufficient documentation

## 2022-12-23 NOTE — Progress Notes (Signed)
HEART & VASCULAR TRANSITION OF CARE CONSULT NOTE     Referring Physician:Dr Jomarie Longs Primary Care: Dr Tenny Craw  Primary Cardiologist: Dr Lynnette Caffey  HPI: Referred to clinic by Dr Jomarie Longs for heart failure consultation.   Marie Ramos is a 67 year old with HFpEF, CKD Stage IIIa,bipolar disorder,  cardiac arrest,  QT syndrome, Medtronic ICD, HTN, CVA, and HLD.   Admitted 11/24/22 A/C HFpEF and falls. Diuresed with IV lasix and transitioned to lasix 40 mg twice a day. Bipolar meds also adjusted in an effort to help with mobility.   Remains at SNF. Physical movement getting better. Having memory issues. She was referred to Neurology.   She presents today with her daughter and husband. Continues to have trouble with her memory. She was referred to Neurology. Having some shortness of breath with exertion. Denies PND/Orthopnea. Appetite ok. She has been on a regualr diet at SNF. Getting bacon/sausage at SNF. No fever or chills. Currently at [facility].  All medications are provided at [facility].   Medtronic Optivol- No VT/VF. Activity 0.9 hours per day. Impedance trending down  Cardiac Testing  Echo 08/2022  1. Left ventricular ejection fraction, by estimation, is 55%. The left  ventricle has normal function. Left ventricular endocardial border not  optimally defined to evaluate regional wall motion despite use of Definity  contrast. There is severe left  ventricular hypertrophy of the septal segment (15 mm), and otherwise wall  thickness is not well visualized. No left ventricular outflow tract  obstruction demonstrated. Left ventricular diastolic parameters are  consistent with Grade I diastolic  dysfunction (impaired relaxation).   2. Right ventricular systolic function is normal. The right ventricular  size is normal. Tricuspid regurgitation signal is inadequate for assessing  PA pressure.   3. The mitral valve is degenerative. Trivial mitral valve regurgitation.  No evidence of mitral  stenosis.   4. The aortic valve was not well visualized. There is mild calcification  of the aortic valve. Aortic valve regurgitation is trivial. Aortic valve  sclerosis/calcification is present, without any evidence of aortic  stenosis.   5. The inferior vena cava is normal in size with greater than 50%  respiratory variability, suggesting right atrial pressure of 3 mmHg.    Cath 2017- no obstructive CAD.   Cardiac MRI 2017 with no late gadolinium enhancement with ejection fraction of 45% and moderate concentric hypertrophy   Review of Systems: [y] = yes, [ ]  = no   General: Weight gain [ ] ; Weight loss [ ] ; Anorexia [ ] ; Fatigue [ ] ; Fever [ ] ; Chills [ ] ; Weakness [ ]   Cardiac: Chest pain/pressure [ ] ; Resting SOB [ ] ; Exertional SOB [Y ]; Orthopnea [ ] ; Pedal Edema [ Y]; Palpitations [ ] ; Syncope [ ] ; Presyncope [ ] ; Paroxysmal nocturnal dyspnea[ ]   Pulmonary: Cough [ ] ; Wheezing[ ] ; Hemoptysis[ ] ; Sputum [ ] ; Snoring [ ]   GI: Vomiting[ ] ; Dysphagia[ ] ; Melena[ ] ; Hematochezia [ ] ; Heartburn[ ] ; Abdominal pain [ ] ; Constipation [ ] ; Diarrhea [ ] ; BRBPR [ ]   GU: Hematuria[ ] ; Dysuria [ ] ; Nocturia[ ]   Vascular: Pain in legs with walking [ ] ; Pain in feet with lying flat [ ] ; Non-healing sores [ ] ; Stroke [ Y]; TIA [ ] ; Slurred speech [ ] ;  Neuro: Headaches[ ] ; Vertigo[ ] ; Seizures[ ] ; Paresthesias[ ] ;Blurred vision [ ] ; Diplopia [ ] ; Vision changes [ ]   Ortho/Skin: Arthritis [ ] ; Joint pain [Y ]; Muscle pain [ ] ; Joint swelling [ ] ;  Back Pain [ Y]; Rash [ ]   Psych: Depression[Y ]; Anxiety[ ]   Heme: Bleeding problems [ ] ; Clotting disorders [ ] ; Anemia [ ]   Endocrine: Diabetes [ ] ; Thyroid dysfunction[ ]    Past Medical History:  Diagnosis Date   Abnormality of gait    Acute bilat watershed infarction [facility])    Acute lower UTI    Acute metabolic encephalopathy 08/16/2019   Acute respiratory failure (HCC)    Acute systolic congestive heart failure (HCC)    AKI (acute kidney injury)  (HCC)    Anoxic brain damage (HCC)    Bipolar 1 disorder (HCC)    Bipolar affective disorder in remission (HCC)    Cardiac arrest (HCC) 08/24/2015   Cerebral infarction due to embolism of cerebral artery (HCC)    Cerebrovascular accident (CVA) due to embolism of cerebral artery (HCC) 12/05/2015   Chronically dry eyes    Closed fracture of left distal radius 12/01/2018   DVT (deep venous thrombosis) (HCC) 12/05/2015   Dyspnea    r/t seasonal allergies   Hypertrophic obstructive cardiomyopathy (HCC) 08/29/2015   Leukocytosis    Macular degeneration    Pneumonia due to COVID-19 virus 08/16/2019   Pulmonary vascular congestion 08/16/2019   QT prolongation 08/29/2015   Stroke (HCC)    Systolic dysfunction with acute on chronic heart failure (HCC)    TBI (traumatic brain injury) (HCC) 2011   Inpatient rehab [facility]   Thrombocytosis    Ventricular fibrillation (HCC) 09/03/15   MDT ICD Dr. Johney Frame    Current Outpatient Medications  Medication Sig Dispense Refill   Acetylcysteine (NAC) 600 MG CAPS Take 1 capsule (600 mg total) by mouth daily. 30 capsule 11   amLODipine (NORVASC) 5 MG tablet Take 1 tablet (5 mg total) by mouth daily with breakfast. 30 tablet 0   amoxicillin (AMOXIL) 500 MG capsule Take 500 mg by mouth 3 (three) times daily.     Ascorbic Acid (VITAMIN C PO) Take 1 capsule by mouth daily.     aspirin EC 81 MG tablet Take 1 tablet (81 mg total) by mouth daily. Swallow whole. 90 tablet 3   atorvastatin (LIPITOR) 20 MG tablet Take 1 tablet (20 mg total) by mouth daily at 6 PM. 30 tablet 0   busPIRone (BUSPAR) 5 MG tablet Take 5 mg by mouth 2 (two) times daily.     carvedilol (COREG) 12.5 MG tablet TAKE ONE TABLET BY MOUTH TWICE DAILY WITH BREAKFAST AND DINNER 180 tablet 2   cholecalciferol (VITAMIN D3) 25 MCG (1000 UNIT) tablet Take 2,000 Units by mouth daily.     empagliflozin (JARDIANCE) 10 MG TABS tablet Take 1 tablet (10 mg total) by mouth daily. 30 tablet    famotidine (PEPCID) 20 MG  tablet Take 20 mg by mouth at bedtime.     fexofenadine (ALLEGRA) 180 MG tablet Take 180 mg by mouth daily.     fluticasone (FLONASE) 50 MCG/ACT nasal spray Place 1 spray into both nostrils daily. Use as directed     furosemide (LASIX) 40 MG tablet Take 1 tablet (40 mg total) by mouth daily. 90 tablet 3   Ginkgo Biloba 40 MG TABS Take 1 tablet (40 mg total) by mouth 2 (two) times daily. 180 tablet 11   Multiple Vitamin (MULTIVITAMIN WITH MINERALS) TABS tablet Take 1 tablet by mouth daily.     Multiple Vitamins-Minerals (OCUVITE PO) Take 1 capsule by mouth daily.     Oxcarbazepine (TRILEPTAL) 300 MG tablet Take 1 tablet (300 mg  total) by mouth daily. Take two tablets by mouth every morning, and take one tablet in the evening 105 tablet 0   polyethylene glycol (MIRALAX / GLYCOLAX) 17 g packet Take 17 g by mouth daily. 14 each 0   triamcinolone ointment (KENALOG) 0.5 % Apply 1 application topically as needed.  0   venlafaxine XR (EFFEXOR XR) 75 MG 24 hr capsule Take 1 capsule (75 mg total) by mouth daily with breakfast. Take with Effexor XR 300 mg 90 capsule 1   venlafaxine XR (EFFEXOR-XR) 150 MG 24 hr capsule Take 1 capsule (150 mg total) by mouth at bedtime. 180 capsule 1   No current facility-administered medications for this encounter.    Allergies  Allergen Reactions   Sulfa Antibiotics Itching    Face neck red   Albuterol Itching   Other Hives   Sulfamethoxazole-Trimethoprim Hives   Vicodin Hp [Hydrocodone-Acetaminophen] Hives   Naproxen Itching   Vicodin [Hydrocodone-Acetaminophen] Itching      Social History   Socioeconomic History   Marital status: Married    Spouse name: Riley Lam   Number of children: 2   Years of education: Not on file   Highest education level: Master's degree (e.g., MA, MS, MEng, MEd, MSW, MBA)  Occupational History   Occupation: substitute Magazine features editor: [employer]  Tobacco Use   Smoking status: Never   Smokeless tobacco: Never   Vaping Use   Vaping Use: Never used  Substance and Sexual Activity   Alcohol use: No   Drug use: No   Sexual activity: Yes    Birth control/protection: Surgical  Other Topics Concern   Not on file  Social History Narrative   Married to Brock Hall for 39 years 1st and only marriage.   2 kids, Christiane Ha is 62, and [name] is 32.      Has MBA in human resources but always wanted to teach.  Taught some business courses in past, but likes sub teaching.  High school or either pre-K.        From [place]. Moved here in 1997 b/c of husband's job at [employer].  Pt's son lives in [place]. Dtr lives in [place].  Has 1 granddaughter, 2 yo.      Grew up in home w/ both parents and sister.  Never abused.       Never legal issues except declared bankruptcy.      Caffeine 1 cup coffee. Or 1 coke a day.      Is Jewish.      Is on Disability for Mental health and heart issues, for 2 years.          Social Determinants of Health   Financial Resource Strain: Low Risk  (11/28/2022)   Overall Financial Resource Strain (CARDIA)    Difficulty of Paying Living Expenses: Not very hard  Food Insecurity: No Food Insecurity (11/28/2022)   Hunger Vital Sign    Worried About Running Out of Food in the Last Year: Never true    Ran Out of Food in the Last Year: Never true  Transportation Needs: No Transportation Needs (11/28/2022)   PRAPARE - Administrator, Civil Service (Medical): No    Lack of Transportation (Non-Medical): No  Physical Activity: Inactive (08/05/2018)   Exercise Vital Sign    Days of Exercise per Week: 0 days    Minutes of Exercise per Session: 0 min  Stress: Stress Concern Present (08/05/2018)   [employer] of Occupational Health -  Occupational Stress Questionnaire    Feeling of Stress : To some extent  Social Connections: Socially Integrated (08/05/2018)   Social Connection and Isolation Panel [NHANES]    Frequency of Communication with Friends and Family: More than  three times a week    Frequency of Social Gatherings with Friends and Family: Once a week    Attends Religious Services: More than 4 times per year    Active Member of [employer] or Organizations: Yes    Attends Engineer, structural: More than 4 times per year    Marital Status: Married  Catering manager Violence: Not At Risk (08/05/2018)   Humiliation, Afraid, Rape, and Kick questionnaire    Fear of Current or Ex-Partner: No    Emotionally Abused: No    Physically Abused: No    Sexually Abused: No      Family History  Problem Relation Age of Onset   Cancer Mother    Depression Mother    CAD Mother    COPD Father    Depression Father    Diabetes Maternal Grandfather    Depression Maternal Grandfather    Arthritis/Rheumatoid Paternal Grandmother     Vitals:   12/23/22 1000  BP: 137/76  Pulse: 78  SpO2: 90%  Weight: 114.2 kg (251 lb 12.8 oz)    PHYSICAL EXAM: General:  Arrived in a wheel chair.  No respiratory difficulty HEENT: normal Neck: supple. JVP 9-10.   Carotids 2+ bilat; no bruits. No lymphadenopathy or thryomegaly appreciated. Cor: PMI nondisplaced. Regular rate & rhythm. No rubs, gallops or murmurs. Lungs: clear Abdomen: soft, nontender, nondistended. No hepatosplenomegaly. No bruits or masses. Good bowel sounds. Extremities: no cyanosis, clubbing, rash, R and LLE 1+ edema Neuro: alert & oriented x 3, cranial nerves grossly intact. moves all 4 extremities w/o difficulty. Affect pleasant.  ECG:SR 1st degree HB    ASSESSMENT & PLAN: 1. Chronic HFpEF  Echo EF 55% RV normal Grade I DD NYHA III. Volume status trending up. Suspect in the setting of high sodium diet. Discussed low salt food choices and limiting fluid intake to < 2 liters per day.  GDMT  Diuretic- Increase lasix to 60 mg po twice a day x 2 days then back to 40 mg po daily  BB- Continue coreg 12.5 mg twice a day.  Ace/ARB/ARNI- hold off with elevated creatinine.  MRA- Hold off with elevated  creatinine .  SGLT2i- Continue jardiance 10 mg daily   2. CKD Stage IIIb Creatinine baseline now seems around 1.7-1.8  She had lab work at [facility].   3. HTN  Stable. Continue current regimen.    Referred to HFSW (PCP, Medications, Transportation, ETOH Abuse, Drug Abuse, Insurance, Financial ):  No Refer to Pharmacy: No Refer to Home Health: No Refer to Advanced Heart Failure Clinic: No Refer to General Cardiology: She is established with Dr Lynnette Caffey  Follow up as needed.   Man Effertz NP-C  2:14 PM

## 2022-12-23 NOTE — Patient Instructions (Addendum)
EKG done today.  No Labs done today.   Take an extra 20mg  (1 tablet) of Lasix for the next 2 days.   No other medication changes were made. Please continue all current medications as prescribed.  Please wear your compression hose daily, place them on as soon as you get up in the morning and remove before you go to bed at night.  Thank you for allowing [name] to provide your heart failure care after your recent hospitalization. Please follow-up with General Cardiology.

## 2022-12-25 ENCOUNTER — Telehealth: Payer: Self-pay | Admitting: Physician Assistant

## 2022-12-25 DIAGNOSIS — I6930 Unspecified sequelae of cerebral infarction: Secondary | ICD-10-CM | POA: Diagnosis not present

## 2022-12-25 DIAGNOSIS — F411 Generalized anxiety disorder: Secondary | ICD-10-CM | POA: Diagnosis not present

## 2022-12-25 DIAGNOSIS — Z87820 Personal history of traumatic brain injury: Secondary | ICD-10-CM | POA: Diagnosis not present

## 2022-12-25 DIAGNOSIS — F419 Anxiety disorder, unspecified: Secondary | ICD-10-CM | POA: Diagnosis not present

## 2022-12-25 DIAGNOSIS — R4189 Other symptoms and signs involving cognitive functions and awareness: Secondary | ICD-10-CM | POA: Diagnosis not present

## 2022-12-25 DIAGNOSIS — G4700 Insomnia, unspecified: Secondary | ICD-10-CM | POA: Diagnosis not present

## 2022-12-25 DIAGNOSIS — F319 Bipolar disorder, unspecified: Secondary | ICD-10-CM | POA: Diagnosis not present

## 2022-12-25 NOTE — Telephone Encounter (Signed)
[name]-spouse left message requesting referral to [facility] Neurology. Has apt available soon, hoping to get. If they receive referral ASAP. [place] # [phone]. Pt in rehab, her emotional well being not good. [employer] @ [phone]

## 2022-12-25 NOTE — Telephone Encounter (Signed)
For the record, I haven't seen her since 09/23/2022, mental health was stable at that time. We had a scheduled visit on 11/24/2022 which ended up being telehealth b/c of her fall and they were going to the ER for that immediately after our visit. She was inpt for 8 days, then went to rehab, so I don't know what happened in the interim. It's not appropriate for me to make a formal decision on whether a referral is needed since I haven't see her.  She already has an appt tomorrow morning with Durenda Age, Neuro PA-C. If Maralyn Sago needs more info, she can let me know.

## 2022-12-25 NOTE — Telephone Encounter (Signed)
Husband is asking for a referral to [facility] Neurology. I asked him if it was for her tremor and he said the rehab facility was concerned about dementia because she is not able to communicate well, is incoherent.  He said the has not been able to get the facility to do a referral.  Patient is to be discharged from rehab tomorrow.   If you feel this appropriate I will let Jola Babinski know.

## 2022-12-26 ENCOUNTER — Telehealth: Payer: Self-pay | Admitting: Physician Assistant

## 2022-12-26 ENCOUNTER — Encounter: Payer: Self-pay | Admitting: Physician Assistant

## 2022-12-26 ENCOUNTER — Ambulatory Visit: Payer: Medicare HMO

## 2022-12-26 ENCOUNTER — Ambulatory Visit: Payer: Medicare HMO | Admitting: Physician Assistant

## 2022-12-26 VITALS — BP 130/80 | HR 71 | Resp 18 | Ht 62.0 in | Wt 246.0 lb

## 2022-12-26 DIAGNOSIS — R413 Other amnesia: Secondary | ICD-10-CM

## 2022-12-26 DIAGNOSIS — Z8673 Personal history of transient ischemic attack (TIA), and cerebral infarction without residual deficits: Secondary | ICD-10-CM | POA: Diagnosis not present

## 2022-12-26 DIAGNOSIS — R531 Weakness: Secondary | ICD-10-CM | POA: Diagnosis not present

## 2022-12-26 DIAGNOSIS — F319 Bipolar disorder, unspecified: Secondary | ICD-10-CM | POA: Diagnosis not present

## 2022-12-26 DIAGNOSIS — R4189 Other symptoms and signs involving cognitive functions and awareness: Secondary | ICD-10-CM | POA: Diagnosis not present

## 2022-12-26 MED ORDER — MEMANTINE HCL 5 MG PO TABS: 5.0000 mg | ORAL_TABLET | Freq: Every day | ORAL | 11 refills | Status: DC

## 2022-12-26 NOTE — Telephone Encounter (Signed)
Dtr reported patient was seen by neuro today. They need to confer with cardiology to see if she can have MRI with her defibrillator. She said they are leaning towards vascular dementia and she was started on Namenda.  She was concerned about the 900 mg of Trileptal, she was taking 300 mg - 2 QAM and 1 QPM.  She said it was decreased at rehab facility. She does not have an appt with you currently, will ask admin to call and schedule.

## 2022-12-26 NOTE — Progress Notes (Signed)
Assessment/Plan:   Marie Ramos is a very pleasant 67 y.o. year old RH female with a history of hypertension, hyperlipidemia, stage IIIa CKD, prior history of cardiac arrest with anoxic brain injury 2017 status post ICD, prior history of CVA 2017, history of DVT 2018, history of bipolar disorder followed by psychiatry, recent mechanical fall May 2024, recent acute on chronic diastolic CHF with acute hypoxic respiratory failure May 2024, anemia, seen today for evaluation of memory loss. MoCA today is 18/30. Most recent CT of the head 11/24/2022 personally reviewed was without acute intracranial findings. Etiology of the memory impairment is likely multifactorial, other workup is in progress.   Memory impairment, likely multifactorial  MRI brain without contrast to assess for underlying structural abnormality and assess vascular load if her ICD is MRI compatible.  Daughter to call. Neurocognitive testing to further evaluate cognitive concerns and determine other underlying cause of memory changes  Continue to control mood as per psychiatry.  She is on venlafaxine XR, oxcarbazepine  Start Memantine 5 mg at night. Side effects discussed (patient is on multiple psychiatric medications and significant cardiac history which could potentially have cardiac complications with the medicine).  Recommending 24/7 monitoring for safety, patient is at risk for falls. Recommend good control of cardiovascular risk factors [place] up in 3  months  Subjective:   The patient is accompanied by her husband, daughter and her son on the phone who supplement the history.   How long did patient have memory difficulties?  "For the last 6-7 years, worse over the last 2 months ".  Daughter reports that the patient has difficulty recalling recent events and to a certain extent long-term "not knowing that she has a history of CKD and CHF when she was recently hospitalized".  "When she turned 60 we made a big party  and she does not remember anything about it    Repeats oneself?  Endorsed.    Disoriented when walking into a room?  Patient denies   Leaving objects in unusual places? denies   Wandering behavior?  Patient was discharged today from [facility] but in the past, she was paranoid, packing her close, because she felt that they were taking pictures of her.  Any personality changes?  Patient denies  More paranoid than before Any history of depression?:  Yes "she is manic depressive".  Follows with psychiatry Hallucinations ?  Patient denies   Seizures?   Patient denies    Any sleep changes?  Denies vivid dreams, REM behavior or sleepwalking   Sleep apnea?  Possibly, for a sleep study soon. She did not like the CPAP in the hospital.  Any hygiene concerns? She needs assistance.  Independent of bathing and dressing? Needs assistance.  Does the patient needs help with medications? Husband  is in charge   Who is in charge of the finances? Husband is in charge     Any changes in appetite? Denies     Patient have trouble swallowing? denies   Does the patient cook? Yes, with supervision.     Any kitchen accidents such as leaving the stove on? denies   Any headaches?   denies   Chronic  pain ?  She has arthritis knees and hands . Had CTS in the past  Recent falls or head injuries?  She has a history of debility and frequent falls, unsteady gait  requiring PT and OT and during the last hospitalization in May 2024 for a mechanical fall she had  CIR.  The falls were believed to be secondary to high-dose ox carbamazepine, as they improved with lower dose. Uses a walker for stability . She is going to have Home PT.  Vision changes? denies   Unilateral weakness, numbness or tingling? denies   Any tremors?   denies   Any anosmia?  denies   Any incontinence of urine?  "She cannot make it because of mobility, not due to incontinence " Any bowel dysfunction? denies      Patient lives on rehab, but she is being  discharged today.  Unfortunately, the patient needs placement at ALF but there are some insurance issues that he and her dad.-Is to have a discussion with social work at his [facility] to try to go ahead with the placement as she needs 24/7.  History of heavy alcohol intake? denies   History of heavy tobacco use? denies   Family history of dementia? Mother and MGF had AD  Does patient drive?No longer drives. 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      Past Medical History:  Diagnosis Date   Abnormality of gait    Acute bilat watershed infarction [facility])    Acute lower UTI    Acute metabolic encephalopathy 08/16/2019   Acute respiratory failure (HCC)    Acute systolic congestive heart failure (HCC)    AKI (acute kidney injury) (HCC)    Anoxic brain damage (HCC)    Bipolar 1 disorder (HCC)    Bipolar affective disorder in remission (HCC)    Cardiac arrest (HCC) 08/24/2015   Cerebral infarction due to embolism of cerebral artery (HCC)    Cerebrovascular accident (CVA) due to embolism of cerebral artery (HCC) 12/05/2015   Chronically dry eyes    Closed fracture of left distal radius 12/01/2018   DVT (deep venous thrombosis) (HCC) 12/05/2015   Dyspnea    r/t seasonal allergies   Hypertrophic obstructive cardiomyopathy (HCC) 08/29/2015   Leukocytosis    Macular degeneration    Pneumonia due to COVID-19 virus 08/16/2019   Pulmonary vascular congestion 08/16/2019   QT prolongation 08/29/2015   Stroke (HCC)    Systolic dysfunction with acute on chronic heart failure (HCC)    TBI (traumatic brain injury) (HCC) 2011   Inpatient rehab [facility]   Thrombocytosis    Ventricular fibrillation (HCC) 09/03/15   MDT ICD Dr. Johney Frame     Past Surgical History:  Procedure Laterality Date   ABDOMINAL HYSTERECTOMY     CARDIAC CATHETERIZATION N/A 08/24/2015   Procedure: Left Heart Cath and Coronary Angiography;  Surgeon: Tonny Bollman, MD;  Location: [facility] INVASIVE CV LAB;   Service: Cardiovascular;  Laterality: N/A;   EP IMPLANTABLE DEVICE N/A 09/03/2015   MDT dual chamber ICD   OPEN REDUCTION INTERNAL FIXATION (ORIF) DISTAL RADIAL FRACTURE Left 12/01/2018   Procedure: OPEN REDUCTION INTERNAL FIXATION (ORIF) LEFT DISTAL RADIAL FRACTURE;  Surgeon: Bjorn Pippin, MD;  Location: [facility] ORS;  Service: Orthopedics;  Laterality: Left;   SPLENECTOMY, TOTAL  2011   TONSILLECTOMY       Allergies  Allergen Reactions   Sulfa Antibiotics Itching    Face neck red   Albuterol Itching   Other Hives   Sulfamethoxazole-Trimethoprim Hives   Vicodin Hp [Hydrocodone-Acetaminophen] Hives   Naproxen Itching   Vicodin [Hydrocodone-Acetaminophen] Itching    Current Outpatient Medications  Medication Instructions   amLODipine (NORVASC) 5 mg, Oral, Daily with breakfast   amoxicillin (AMOXIL) 500 mg,  Oral, 3 times daily   Ascorbic Acid (VITAMIN C PO) 1 capsule, Oral, Daily   aspirin EC 81 mg, Oral, Daily, Swallow whole.   atorvastatin (LIPITOR) 20 mg, Oral, Daily-1800   busPIRone (BUSPAR) 5 mg, Oral, 2 times daily   carvedilol (COREG) 12.5 MG tablet TAKE ONE TABLET BY MOUTH TWICE DAILY WITH BREAKFAST AND DINNER   cholecalciferol (VITAMIN D3) 2,000 Units, Oral, Daily   empagliflozin (JARDIANCE) 10 mg, Oral, Daily   famotidine (PEPCID) 20 mg, Oral, Daily at bedtime   fexofenadine (ALLEGRA) 180 mg, Oral, Daily   fluticasone (FLONASE) 50 MCG/ACT nasal spray 1 spray, Each Nare, Daily, Use as directed   furosemide (LASIX) 40 mg, Oral, Daily   Ginkgo Biloba 40 mg, Oral, 2 times daily   memantine (NAMENDA) 5 mg, Oral, Daily at bedtime   Multiple Vitamin (MULTIVITAMIN WITH MINERALS) TABS tablet 1 tablet, Oral, Daily   Multiple Vitamins-Minerals (OCUVITE PO) 1 capsule, Oral, Daily   NAC 600 mg, Oral, Daily   Oxcarbazepine (TRILEPTAL) 300 mg, Oral, Daily, Take two tablets by mouth every morning, and take one tablet in the evening   polyethylene glycol (MIRALAX / GLYCOLAX) 17 g, Oral,  Daily   triamcinolone ointment (KENALOG) 0.5 % 1 application , Topical, As needed   venlafaxine XR (EFFEXOR XR) 75 mg, Oral, Daily with breakfast, Take with Effexor XR 300 mg   venlafaxine XR (EFFEXOR-XR) 150 mg, Oral, Daily at bedtime     VITALS:   Vitals:   12/26/22 0740 12/26/22 0912  BP: (!) 168/87 130/80  Pulse: 71   Resp: 18   SpO2: 95%   Weight: 246 lb (111.6 kg)   Height: 5\' 2"  (1.575 m)       08/05/2018    2:42 PM 07/24/2017    1:58 PM 09/26/2015    9:23 AM  Depression screen PHQ 2/9  Decreased Interest  0 0  Down, Depressed, Hopeless  0 0  PHQ - 2 Score  0 0  Altered sleeping   3  Tired, decreased energy   1  Change in appetite   0  Feeling bad or failure about yourself    0  Trouble concentrating   2  Moving slowly or fidgety/restless   0  Suicidal thoughts   0  PHQ-9 Score   6  Difficult doing work/chores   Not difficult at all     Information is confidential and restricted. Go to Review Flowsheets to unlock data.    PHYSICAL EXAM   HEENT:  Normocephalic, atraumatic. The superficial temporal arteries are without ropiness or tenderness. Cardiovascular: Regular rate and rhythm. Lungs: Clear to auscultation bilaterally. Neck: There are no carotid bruits noted bilaterally.  NEUROLOGICAL:    12/26/2022   10:00 AM  Montreal Cognitive Assessment   Visuospatial/ Executive (0/5) 2  Naming (0/3) 3  Attention: Read list of digits (0/2) 2  Attention: Read list of letters (0/1) 1  Attention: Serial 7 subtraction starting at 100 (0/3) 0  Language: Repeat phrase (0/2) 1  Language : Fluency (0/1) 0  Abstraction (0/2) 1  Delayed Recall (0/5) 2  Orientation (0/6) 6  Total 18  Adjusted Score (based on education) 18        No data to display           Orientation:  Alert and oriented to person, place and time. No aphasia or dysarthria. Fund of knowledge is appropriate. Recent and remote memory impaired.  Attention and concentration are reduced.  Able to  name  objects and repeat phrases. Delayed recall 2/6 Cranial nerves: There is good facial symmetry. Extraocular muscles are intact and visual fields are full to confrontational testing. Speech is fluent and clear. No tongue deviation. Hearing is intact to conversational tone. Tone: Tone is good throughout. Sensation: Sensation is intact to light touch. Vibration is intact at the bilateral big toe.  Coordination: The patient has no difficulty with RAM's or FNF bilaterally. Normal finger to nose  Motor: Strength is 5/5 in the bilateral upper and lower extremities. There is no pronator drift. There are no fasciculations noted. DTR's: Deep tendon reflexes are 2/4 bilaterally. Gait and Station: The patient is wheelchair-bound due to fall risk.  Gait not tested.   Thank you for allowing [name] the opportunity to participate in the care of this nice patient. Please do not hesitate to contact us for any questions or concerns.   Total time spent on today's visit was 60 minutes dedicated to this patient today, preparing to see patient, examining the patient, ordering tests and/or medications and counseling the patient, documenting clinical information in the EHR or other health record, independently interpreting results and communicating results to the patient/family, discussing treatment and goals, answering patient's questions and coordinating care.  Cc:  Daisy Floro, MD  Marlowe Kays 12/26/2022 10:28 AM

## 2022-12-26 NOTE — Telephone Encounter (Signed)
Pt's daughter, Efraim Kaufmann, LVM @ 3:00p. I do not see her on daughter on the DPR for [facility].  Her husband called yesterday and he is on the [facility].  Daughter said pt got out of rehab and they are concerned about the dosage of Trileptal that she is supposed to take.  She said it was reduced when she was in rehab and now it says something that's about 900mg  worth instead of the 300mg  they thought she is supposed to have.  Pls call her back at [phone]

## 2022-12-26 NOTE — Telephone Encounter (Signed)
noted 

## 2022-12-26 NOTE — Patient Instructions (Addendum)
It was a pleasure to see you today at our office.   Recommendations:  Neurocognitive evaluation at our office   MRI of the brain, the radiology office will call you to arrange you appointment    Start memantine Take 1 tablet (5 mg at night) Follow in 3 months   Counseling regarding caregiver distress, including caregiver depression, anxiety and issues regarding community resources, adult day care programs, adult living facilities, or memory care questions:  please contact your  Primary Doctor's Social Worker   Whom to call: Memory  decline, memory medications: Call our office [phone]   For psychiatric meds, mood meds: Please have your primary care physician manage these medications.  If you have any severe symptoms of a stroke, or other severe issues such as confusion,severe chills or fever, etc call 911 or go to the ER as you may need to be evaluated further    RECOMMENDATIONS FOR ALL PATIENTS WITH MEMORY PROBLEMS: 1. Continue to exercise (Recommend 30 minutes of walking everyday, or 3 hours every week) 2. Increase social interactions - continue going to [place] and enjoy social gatherings with friends and family 3. Eat healthy, avoid fried foods and eat more fruits and vegetables 4. Maintain adequate blood pressure, blood sugar, and blood cholesterol level. Reducing the risk of stroke and cardiovascular disease also helps promoting better memory. 5. Avoid stressful situations. Live a simple life and avoid aggravations. Organize your time and prepare for the next day in anticipation. 6. Sleep well, avoid any interruptions of sleep and avoid any distractions in the bedroom that may interfere with adequate sleep quality 7. Avoid sugar, avoid sweets as there is a strong link between excessive sugar intake, diabetes, and cognitive impairment We discussed the Mediterranean diet, which has been shown to help patients reduce the risk of progressive memory disorders and reduces cardiovascular  risk. This includes eating fish, eat fruits and green leafy vegetables, nuts like almonds and hazelnuts, walnuts, and also use olive oil. Avoid fast foods and fried foods as much as possible. Avoid sweets and sugar as sugar use has been linked to worsening of memory function.  There is always a concern of gradual progression of memory problems. If this is the case, then we may need to adjust level of care according to patient needs. Support, both to the patient and caregiver, should then be put into place.      You have been referred for a neuropsychological evaluation (i.e., evaluation of memory and thinking abilities). Please bring someone with you to this appointment if possible, as it is helpful for the doctor to hear from both you and another adult who knows you well. Please bring eyeglasses and hearing aids if you wear them.    The evaluation will take approximately 3 hours and has two parts:   The first part is a clinical interview with the neuropsychologist (Dr. Milbert Coulter or Dr. Roseanne Reno). During the interview, the neuropsychologist will speak with you and the individual you brought to the appointment.    The second part of the evaluation is testing with the doctor's technician Annabelle Harman or Selena Batten). During the testing, the technician will ask you to remember different types of material, solve problems, and answer some questionnaires. Your family member will not be present for this portion of the evaluation.   Please note: We must reserve several hours of the neuropsychologist's time and the psychometrician's time for your evaluation appointment. As such, there is a No-Show fee of $100. If you are unable to  attend any of your appointments, please contact our office as soon as possible to reschedule.    FALL PRECAUTIONS: Be cautious when walking. Scan the area for obstacles that may increase the risk of trips and falls. When getting up in the mornings, sit up at the edge of the bed for a few minutes  before getting out of bed. Consider elevating the bed at the head end to avoid drop of blood pressure when getting up. Walk always in a well-lit room (use night lights in the walls). Avoid area rugs or power cords from appliances in the middle of the walkways. Use a walker or a cane if necessary and consider physical therapy for balance exercise. Get your eyesight checked regularly.  FINANCIAL OVERSIGHT: Supervision, especially oversight when making financial decisions or transactions is also recommended.  HOME SAFETY: Consider the safety of the kitchen when operating appliances like stoves, microwave oven, and blender. Consider having supervision and share cooking responsibilities until no longer able to participate in those. Accidents with firearms and other hazards in the house should be identified and addressed as well.   ABILITY TO BE LEFT ALONE: If patient is unable to contact 911 operator, consider using LifeLine, or when the need is there, arrange for someone to stay with patients. Smoking is a fire hazard, consider supervision or cessation. Risk of wandering should be assessed by caregiver and if detected at any point, supervision and safe proof recommendations should be instituted.  MEDICATION SUPERVISION: Inability to self-administer medication needs to be constantly addressed. Implement a mechanism to ensure safe administration of the medications.       Mediterranean Diet A Mediterranean diet refers to food and lifestyle choices that are based on the traditions of countries located on the [place]. This way of eating has been shown to help prevent certain conditions and improve outcomes for people who have chronic diseases, like kidney disease and heart disease. What are tips for following this plan? Lifestyle  Cook and eat meals together with your family, when possible. Drink enough fluid to keep your urine clear or pale yellow. Be physically active every day. This  includes: Aerobic exercise like running or swimming. Leisure activities like gardening, walking, or housework. Get 7-8 hours of sleep each night. If recommended by your health care provider, drink red wine in moderation. This means 1 glass a day for nonpregnant women and 2 glasses a day for men. A glass of wine equals 5 oz (150 mL). Reading food labels  Check the serving size of packaged foods. For foods such as rice and pasta, the serving size refers to the amount of cooked product, not dry. Check the total fat in packaged foods. Avoid foods that have saturated fat or trans fats. Check the ingredients list for added sugars, such as corn syrup. Shopping  At the grocery store, buy most of your food from the areas near the walls of the store. This includes: Fresh fruits and vegetables (produce). Grains, beans, nuts, and seeds. Some of these may be available in unpackaged forms or large amounts (in bulk). Fresh seafood. Poultry and eggs. Low-fat dairy products. Buy whole ingredients instead of prepackaged foods. Buy fresh fruits and vegetables in-season from local farmers markets. Buy frozen fruits and vegetables in resealable bags. If you do not have access to quality fresh seafood, buy precooked frozen shrimp or canned fish, such as tuna, salmon, or sardines. Buy small amounts of raw or cooked vegetables, salads, or olives from the deli or salad  bar at your store. Stock your pantry so you always have certain foods on hand, such as olive oil, canned tuna, canned tomatoes, rice, pasta, and beans. Cooking  Cook foods with extra-virgin olive oil instead of using butter or other vegetable oils. Have meat as a side dish, and have vegetables or grains as your main dish. This means having meat in small portions or adding small amounts of meat to foods like pasta or stew. Use beans or vegetables instead of meat in common dishes like chili or lasagna. Experiment with different cooking methods. Try  roasting or broiling vegetables instead of steaming or sauteing them. Add frozen vegetables to soups, stews, pasta, or rice. Add nuts or seeds for added healthy fat at each meal. You can add these to yogurt, salads, or vegetable dishes. Marinate fish or vegetables using olive oil, lemon juice, garlic, and fresh herbs. Meal planning  Plan to eat 1 vegetarian meal one day each week. Try to work up to 2 vegetarian meals, if possible. Eat seafood 2 or more times a week. Have healthy snacks readily available, such as: Vegetable sticks with hummus. Greek yogurt. Fruit and nut trail mix. Eat balanced meals throughout the week. This includes: Fruit: 2-3 servings a day Vegetables: 4-5 servings a day Low-fat dairy: 2 servings a day Fish, poultry, or lean meat: 1 serving a day Beans and legumes: 2 or more servings a week Nuts and seeds: 1-2 servings a day Whole grains: 6-8 servings a day Extra-virgin olive oil: 3-4 servings a day Limit red meat and sweets to only a few servings a month What are my food choices? Mediterranean diet Recommended Grains: Whole-grain pasta. Brown rice. Bulgar wheat. Polenta. Couscous. Whole-wheat bread. Orpah Cobb. Vegetables: Artichokes. Beets. Broccoli. Cabbage. Carrots. Eggplant. Green beans. Chard. Kale. Spinach. Onions. Leeks. Peas. Squash. Tomatoes. Peppers. Radishes. Fruits: Apples. Apricots. Avocado. Berries. Bananas. Cherries. Dates. Figs. Grapes. Lemons. Melon. Oranges. Peaches. Plums. Pomegranate. Meats and other protein foods: Beans. Almonds. Sunflower seeds. Pine nuts. Peanuts. Cod. Salmon. Scallops. Shrimp. Tuna. Tilapia. Clams. Oysters. Eggs. Dairy: Low-fat milk. Cheese. Greek yogurt. Beverages: Water. Red wine. Herbal tea. Fats and oils: Extra virgin olive oil. Avocado oil. Grape seed oil. Sweets and desserts: [place] yogurt with honey. Baked apples. Poached pears. Trail mix. Seasoning and other foods: Basil. Cilantro. Coriander. Cumin. Mint.  Parsley. Sage. Rosemary. Tarragon. Garlic. Oregano. Thyme. Pepper. Balsalmic vinegar. Tahini. Hummus. Tomato sauce. Olives. Mushrooms. Limit these Grains: Prepackaged pasta or rice dishes. Prepackaged cereal with added sugar. Vegetables: Deep fried potatoes (french fries). Fruits: Fruit canned in syrup. Meats and other protein foods: Beef. Pork. Lamb. Poultry with skin. Hot dogs. Tomasa Blase. Dairy: Ice cream. Sour cream. Whole milk. Beverages: Juice. Sugar-sweetened soft drinks. Beer. Liquor and spirits. Fats and oils: Butter. Canola oil. Vegetable oil. Beef fat (tallow). Lard. Sweets and desserts: Cookies. Cakes. Pies. Candy. Seasoning and other foods: Mayonnaise. Premade sauces and marinades. The items listed may not be a complete list. Talk with your dietitian about what dietary choices are right for you. Summary The Mediterranean diet includes both food and lifestyle choices. Eat a variety of fresh fruits and vegetables, beans, nuts, seeds, and whole grains. Limit the amount of red meat and sweets that you eat. Talk with your health care provider about whether it is safe for you to drink red wine in moderation. This means 1 glass a day for nonpregnant women and 2 glasses a day for men. A glass of wine equals 5 oz (150 mL). This information is not  intended to replace advice given to you by your health care provider. Make sure you discuss any questions you have with your health care provider. Document Released: 02/14/2016 Document Revised: 03/18/2016 Document Reviewed: 02/14/2016 Elsevier Interactive Patient Education  2017 [employer].      MRI at [place] [facility]

## 2022-12-26 NOTE — Telephone Encounter (Signed)
Pts daughter is calling in stating that she spoke with the Cardiologist and they stated that they need something faxed over to them asking for a surgical clearance since the pt has a internal defibrillator.  Pts cardiologist is Lupita Raider, PhD, MD at [phone] (F).

## 2022-12-29 NOTE — Progress Notes (Signed)
Cardiology Office Note:    Date:  12/31/2022   ID:  Marie Ramos, Marie Ramos 02/02/1956, MRN [id number]  PCP:  Daisy Floro, MD   [facility] Providers Cardiologist:  Alverda Skeans, MD Referring MD: Daisy Floro, MD   Chief Complaint/Reason for Referral: Shortness of breath  ASSESSMENT:    1. Chronic diastolic congestive heart failure (HCC)   2. Stage 3a chronic kidney disease (HCC)   3. Cardiac arrest (HCC)   4. Essential hypertension   5. Cerebrovascular accident (CVA), unspecified mechanism (HCC)   6. BMI 45.0-49.9, adult (HCC)   7. Hyperlipidemia LDL goal <70   8. Aortic atherosclerosis (HCC)     PLAN:    In order of problems listed above: 1.  Chronic diastolic heart failure: Continue Coreg 12.5 mg twice daily and Jardiance 10 mg daily.  Continue Lasix 40 mg daily.  Will start losartan 12.5 mg daily and check a BMP in 1 week. 2.  Stage III chronic kidney disease: Continue Jardiance for renal protection.  Start losartan as above. 3.  Cardiac arrest: Status post ICD and followed by the EP division; recent interrogation demonstrated no atrial fibrillation. 4.  Hypertension: BP is well controlled today. 5.  History of stroke: Continue aspirin 81 mg. 6.  Elevated BMI: Continue diet and exercise modification. 7.  Hyperlipidemia: Will check lipid panel, LFTs, LP(a) next week.  Given her history of stroke her LDL goal is less than 55.             Dispo:  Return in about 3 months (around 04/02/2023).      Medication Adjustments/Labs and Tests Ordered: Current medicines are reviewed at length with the patient today.  Concerns regarding medicines are outlined above.  The following changes have been made:     Labs/tests ordered: Orders Placed This Encounter  Procedures   Comprehensive metabolic panel   Lipid panel   Lipoprotein A (LPA)    Medication Changes: Meds ordered this encounter  Medications   losartan (COZAAR) 25 MG tablet    Sig: Take 0.5  tablets (12.5 mg total) by mouth at bedtime.    Dispense:  45 tablet    Refill:  3     Current medicines are reviewed at length with the patient today.  The patient does not have concerns regarding medicines.   History of Present Illness:    FOCUSED PROBLEM LIST:   1.  VF arrest status post ICD with noted long QT; cardiac catheterization 2017 with no obstructive coronary artery disease 2.  Chronic kidney disease with GFR of 49 and a serum creatinine of 1.2 3.  Prior stroke and per EP note subclinical atrial fibrillation so remains on Eliquis; review of monitoring with EP division May 2023 showed very low atrial fibrillation burden so Eliquis discontinued. 4.  Hypertension 5.  BMI 49 6.  Status post OptiVol monitoring device 7.  Hyperlipidemia 8.  Aortic atherosclerosis on chest x-ray May 2024 9.  Cognitive impairment; followed by neurology   May 2023: The patient is a 67 y.o. female with the indicated medical history here to establish general cardiology care.  The patient is followed by the EP division given her subclinical atrial fibrillation and ICD placement for ventricular fibrillatory arrest thought due to long QT syndrome.  The patient contacted the office recently due to shortness of breath.   The patient has been short of breath for many years.  I reviewed her weights over the last several years and they have  been stable.  Shortness of breath is not a new issue.  She noticed a little bit more peripheral edema particularly in the evenings.  This is sometimes associated with difficulty putting her shoes on.  She tells me she is short of breath with walking.  She is not short of breath at rest.  She is unsure if she is short of breath when she lies down but she only uses 1 pillow to sleep.  She denies any exertional angina, presyncope, syncope, palpitations, signs or symptoms of stroke, or severe bleeding while on Eliquis.  She has required no emergency room visits or hospitalizations.   Plan: Start Jardiance 10 mg daily and Lasix 20 mg twice daily; stop Eliquis due to very low atrial fibrillation burden and start aspirin 81 mg.  Refer to pharmacy given elevated BMI.   Today: In the interim the patient had her ICD interrogated which showed no atrial fibrillation.  Echocardiogram demonstrated preserved LV function with ejection fraction 55% with no significant valvular abnormalities.  She was seen in this division in February and was doing fairly well.  The patient missed her appointment with me in May.  She was admitted at that time with heart failure and falls.  She was diuresed with Lasix and transition to Lasix 40 mg twice daily.  She was seen in heart failure clinic for follow-up.  Dietary indiscretion at SNF was noted.  Her Lasix was increased to 60 mg for 2 days then back to 40 mg daily.  ARB, spironolactone or Entresto was deferred given elevated creatinine.  [place] Pepper was continued.  She recently returned home from the facility.  She is here with her daughter.  Her daughter is quite cognizant of indiscretion in regards to her diet and his modifying her diet.  Her daughter used to be a Investment banker, operational.  The patient has developed cognitive impairment and saw neurology recently.  She was started on Namenda.  She denies any shortness of breath, and her edema is improved after pulsed dosing of Lasix recently.  Her weight is down to around 246 pounds.  They are weighing her every day.  She denies any exertional angina or exertional dyspnea.  She has a pretty low functional capacity and does not do much in a day.  She has not had any recurrent falls.       Previous Medical History: Past Medical History:  Diagnosis Date   Abnormality of gait    Acute bilat watershed infarction [facility])    Acute lower UTI    Acute metabolic encephalopathy 08/16/2019   Acute respiratory failure (HCC)    Acute systolic congestive heart failure (HCC)    AKI (acute kidney injury) (HCC)    Anoxic brain damage (HCC)     Bipolar 1 disorder (HCC)    Bipolar affective disorder in remission (HCC)    Cardiac arrest (HCC) 08/24/2015   Cerebral infarction due to embolism of cerebral artery (HCC)    Cerebrovascular accident (CVA) due to embolism of cerebral artery (HCC) 12/05/2015   Chronically dry eyes    Closed fracture of left distal radius 12/01/2018   DVT (deep venous thrombosis) (HCC) 12/05/2015   Dyspnea    r/t seasonal allergies   Hypertrophic obstructive cardiomyopathy (HCC) 08/29/2015   Leukocytosis    Macular degeneration    Pneumonia due to COVID-19 virus 08/16/2019   Pulmonary vascular congestion 08/16/2019   QT prolongation 08/29/2015   Stroke (HCC)    Systolic dysfunction with acute on chronic heart failure (HCC)  TBI (traumatic brain injury) [facility]) 2011   Inpatient rehab [facility]   Thrombocytosis    Ventricular fibrillation (HCC) 09/03/15   MDT ICD Dr. Johney Frame     Current Medications: Current Meds  Medication Sig   Acetylcysteine (NAC) 600 MG CAPS Take 1 capsule (600 mg total) by mouth daily.   amLODipine (NORVASC) 5 MG tablet Take 1 tablet (5 mg total) by mouth daily with breakfast.   amoxicillin (AMOXIL) 500 MG capsule Take 500 mg by mouth 3 (three) times daily.   Ascorbic Acid (VITAMIN C PO) Take 1 capsule by mouth daily.   aspirin EC 81 MG tablet Take 1 tablet (81 mg total) by mouth daily. Swallow whole.   atorvastatin (LIPITOR) 20 MG tablet Take 1 tablet (20 mg total) by mouth daily at 6 PM.   busPIRone (BUSPAR) 5 MG tablet Take 5 mg by mouth 2 (two) times daily.   carvedilol (COREG) 12.5 MG tablet TAKE ONE TABLET BY MOUTH TWICE DAILY WITH BREAKFAST AND DINNER   cholecalciferol (VITAMIN D3) 25 MCG (1000 UNIT) tablet Take 2,000 Units by mouth daily.   empagliflozin (JARDIANCE) 10 MG TABS tablet Take 1 tablet (10 mg total) by mouth daily.   famotidine (PEPCID) 20 MG tablet Take 20 mg by mouth at bedtime.   fexofenadine (ALLEGRA) 180 MG tablet Take 180 mg by mouth daily.   fluticasone (FLONASE)  50 MCG/ACT nasal spray Place 1 spray into both nostrils daily. Use as directed   furosemide (LASIX) 40 MG tablet Take 1 tablet (40 mg total) by mouth daily.   Ginkgo Biloba 40 MG TABS Take 1 tablet (40 mg total) by mouth 2 (two) times daily.   losartan (COZAAR) 25 MG tablet Take 0.5 tablets (12.5 mg total) by mouth at bedtime.   memantine (NAMENDA) 5 MG tablet Take 1 tablet (5 mg total) by mouth at bedtime.   Multiple Vitamin (MULTIVITAMIN WITH MINERALS) TABS tablet Take 1 tablet by mouth daily.   Multiple Vitamins-Minerals (OCUVITE PO) Take 1 capsule by mouth daily.   Oxcarbazepine (TRILEPTAL) 300 MG tablet Take 1 tablet (300 mg total) by mouth daily. Take two tablets by mouth every morning, and take one tablet in the evening   polyethylene glycol (MIRALAX / GLYCOLAX) 17 g packet Take 17 g by mouth daily.   triamcinolone ointment (KENALOG) 0.5 % Apply 1 application topically as needed.   venlafaxine XR (EFFEXOR XR) 75 MG 24 hr capsule Take 1 capsule (75 mg total) by mouth daily with breakfast. Take with Effexor XR 300 mg   venlafaxine XR (EFFEXOR-XR) 150 MG 24 hr capsule Take 1 capsule (150 mg total) by mouth at bedtime.     Allergies:    Sulfa antibiotics, Albuterol, Other, Sulfamethoxazole-trimethoprim, Vicodin hp [hydrocodone-acetaminophen], Naproxen, and Vicodin [hydrocodone-acetaminophen]   Social History:   Social History   Tobacco Use   Smoking status: Never   Smokeless tobacco: Never  Vaping Use   Vaping Use: Never used  Substance Use Topics   Alcohol use: No   Drug use: No     Family Hx: Family History  Problem Relation Age of Onset   Cancer Mother    Depression Mother    CAD Mother    COPD Father    Depression Father    Diabetes Maternal Grandfather    Depression Maternal Grandfather    Arthritis/Rheumatoid Paternal Grandmother      Review of Systems:   Please see the history of present illness.    All other systems reviewed and  are negative.      EKGs/Labs/Other Test Reviewed:    EKG:    EKG Interpretation  Date/Time:    Ventricular Rate:    PR Interval:    QRS Duration:   QT Interval:    QTC Calculation:   R Axis:     Text Interpretation:           Prior CV studies:  Cardiac Studies & Procedures   CARDIAC CATHETERIZATION  CARDIAC CATHETERIZATION 08/24/2015  Narrative Widely patent coronary arteries Normal LVEDP  Recommendation: 2D Echo ordered. Suspect primary arrhythmia. Recommend EP consult pending neurologic recovery. Pt is on multiple psychotropic/antidepressant drugs for bipolar disorder which has been well-controlled for several years with no recent medication changes. Possible that medication-induced prolonged QT caused VF as the etiology of her arrest.  Findings Coronary Findings Diagnostic  Dominance: Right  Left Main . Vessel is angiographically normal. Large, normal vessel  Left Anterior Descending . Vessel is angiographically normal. No angiographic disease  Left Circumflex . Vessel is angiographically normal. No angiographic disease  Right Coronary Artery Large caliber, angiographically normal vessel  Intervention  No interventions have been documented.     ECHOCARDIOGRAM  ECHOCARDIOGRAM COMPLETE 09/04/2022  Narrative ECHOCARDIOGRAM REPORT    Patient Name:   KAYSIA CRANOR Date of Exam: 09/04/2022 Medical Rec #:  [id number]         Height:       62.0 in Accession #:    [id number]        Weight:       255.0 lb Date of Birth:  07-31-1955         BSA:          2.120 m Patient Age:    12 years          BP:           138/70 mmHg Patient Gender: F                 HR:           63 bpm. Exam Location:  [address]  Procedure: 2D Echo, Cardiac Doppler, Color Doppler and Intracardiac Opacification Agent  Indications:    R06.00 Dyspnea  History:        Patient has prior history of Echocardiogram examinations, most recent 08/17/2019. CHF, Defibrillator, Stroke,  Arrythmias:Cardiac Arrest and Ventricular Fibrillation, Signs/Symptoms:Dyspnea; Risk Factors:Family History of Coronary Artery Disease. V-Fib Arrest (2017), Obesity, Prolonged QT.  Sonographer:    Farrel Conners RDCS Referring Phys: Sharlene Dory   Sonographer Comments: Technically difficult study due to poor echo windows. IMPRESSIONS   1. Left ventricular ejection fraction, by estimation, is 55%. The left ventricle has normal function. Left ventricular endocardial border not optimally defined to evaluate regional wall motion despite use of Definity contrast. There is severe left ventricular hypertrophy of the septal segment (15 mm), and otherwise wall thickness is not well visualized. No left ventricular outflow tract obstruction demonstrated. Left ventricular diastolic parameters are consistent with Grade I diastolic dysfunction (impaired relaxation). 2. Right ventricular systolic function is normal. The right ventricular size is normal. Tricuspid regurgitation signal is inadequate for assessing PA pressure. 3. The mitral valve is degenerative. Trivial mitral valve regurgitation. No evidence of mitral stenosis. 4. The aortic valve was not well visualized. There is mild calcification of the aortic valve. Aortic valve regurgitation is trivial. Aortic valve sclerosis/calcification is present, without any evidence of aortic stenosis. 5. The inferior vena cava is normal in size with greater than  50% respiratory variability, suggesting right atrial pressure of 3 mmHg.  FINDINGS Left Ventricle: Left ventricular ejection fraction, by estimation, is 55%. The left ventricle has normal function. Left ventricular endocardial border not optimally defined to evaluate regional wall motion. Definity contrast agent was given IV to delineate the left ventricular endocardial borders. The left ventricular internal cavity size was normal in size. There is severe left ventricular hypertrophy of the septal  segment. Left ventricular diastolic parameters are consistent with Grade I diastolic dysfunction (impaired relaxation).  Right Ventricle: The right ventricular size is normal. No increase in right ventricular wall thickness. Right ventricular systolic function is normal. Tricuspid regurgitation signal is inadequate for assessing PA pressure.  Left Atrium: Left atrial size was normal in size.  Right Atrium: Right atrial size was normal in size.  Pericardium: There is no evidence of pericardial effusion.  Mitral Valve: The mitral valve is degenerative in appearance. Mild to moderate mitral annular calcification. Trivial mitral valve regurgitation. No evidence of mitral valve stenosis.  Tricuspid Valve: The tricuspid valve is normal in structure. Tricuspid valve regurgitation is trivial. No evidence of tricuspid stenosis.  Aortic Valve: The aortic valve was not well visualized. There is mild calcification of the aortic valve. Aortic valve regurgitation is trivial. Aortic valve sclerosis/calcification is present, without any evidence of aortic stenosis.  Pulmonic Valve: The pulmonic valve was not well visualized. Pulmonic valve regurgitation is not visualized. No evidence of pulmonic stenosis.  Aorta: The aortic root is normal in size and structure and the ascending aorta was not well visualized. Ascending aorta measurements are within normal limits for age when indexed to body surface area.  Venous: The inferior vena cava is normal in size with greater than 50% respiratory variability, suggesting right atrial pressure of 3 mmHg.  IAS/Shunts: The atrial septum is grossly normal.  Additional Comments: A device lead is visualized in the right atrium and right ventricle.   LEFT VENTRICLE PLAX 2D LVIDd:         3.00 cm   Diastology LVIDs:         2.25 cm   LV e' medial:    5.82 cm/s LV PW:         1.40 cm   LV E/e' medial:  15.7 LV IVS:        1.40 cm   LV e' lateral:   5.73 cm/s LVOT diam:      2.60 cm   LV E/e' lateral: 15.9 LV SV:         131 LV SV Index:   62 LVOT Area:     5.31 cm   RIGHT VENTRICLE RV Basal diam:  3.70 cm RV S prime:     14.00 cm/s TAPSE (M-mode): 2.3 cm  LEFT ATRIUM             Index        RIGHT ATRIUM           Index LA diam:        3.60 cm 1.70 cm/m   RA Pressure: 3.00 mmHg LA Vol (A2C):   60.2 ml 28.40 ml/m  RA Area:     15.10 cm LA Vol (A4C):   43.2 ml 20.38 ml/m  RA Volume:   38.80 ml  18.31 ml/m LA Biplane Vol: 51.2 ml 24.16 ml/m AORTIC VALVE LVOT Vmax:   109.50 cm/s LVOT Vmean:  71.950 cm/s LVOT VTI:    0.248 m  AORTA Ao Root diam: 2.80 cm Ao Asc diam:  3.70 cm  MITRAL VALVE                TRICUSPID VALVE MV Area (PHT): cm          Estimated RAP:  3.00 mmHg MV Decel Time: 279 msec MV E velocity: 91.25 cm/s   SHUNTS MV A velocity: 116.00 cm/s  Systemic VTI:  0.25 m MV E/A ratio:  0.79         Systemic Diam: 2.60 cm  Weston Brass MD Electronically signed by Weston Brass MD Signature Date/Time: 09/04/2022/2:33:12 PM    Final      CARDIAC MRI  MR CARDIAC MORPHOLOGY W WO CONTRAST 08/31/2015  Narrative CLINICAL DATA:  67 year old female post cardiac arrest with no evidence of CAD on a cardiac catheterization.  EXAM: CARDIAC MRI  TECHNIQUE: The patient was scanned on a 1.5 Tesla GE magnet. A dedicated cardiac coil was used. Functional imaging was done using Fiesta sequences. 2,3, and 4 chamber views were done to assess for RWMA's. Modified Simpson's rule using a short axis stack was used to calculate an ejection fraction on a dedicated work Research officer, trade [employer]. The patient received 30 cc of Multihance. After 10 minutes inversion recovery sequences were used to assess for infiltration and scar tissue.  CONTRAST:  30 cc  of Multihance  FINDINGS: 1. Moderate left ventricular dilatation with moderate concentric hypertrophy and mildly decreased systolic function (LVEF = 45%) with diffuse  hypokinesis.  There is no late gadolinium enhancement in the left ventricular myocardium.  LVEDD:  63 mm  LVESD:  LVEDV:  141 ml  LVESV:  78 ml  SV:  63 ml  CO:  4.1 L/minute  Myocardial mass:  163 g  2. Normal right ventricular size, thickness and systolic function (RVEF = 58%).  3.  Mildly dilated left atrium.  4.  Mild mitral and tricuspid regurgitation.  5. Normal size of the aortic root, all portions of the thoracic aorta and of the pulmonary artery.  IMPRESSION: 1. Moderate left ventricular dilatation with moderate concentric hypertrophy and mildly decreased systolic function (LVEF = 45%) with diffuse hypokinesis.  2. Normal right ventricular size, thickness and systolic function (RVEF = 58%).  3. Mildly dilated left atrium.  4.  Mild mitral and tricuspid regurgitation.  5. No late gadolinium enhancement in the left ventricular myocardium.  Conclusively, these findings are suggestive of dilated hypertensive cardiomyopathy rather than hypertrophic cardiomyopathy.  Tobias Alexander   Electronically Signed By: Tobias Alexander On: 08/31/2015 18:32         Other studies Reviewed: Review of the additional studies/records demonstrates: Aortic atherosclerosis seen on chest x-ray May 2024  Recent Labs: 07/30/2022: TSH 2.420 11/24/2022: B Natriuretic Peptide 120.2 11/25/2022: ALT 49 11/26/2022: Magnesium 2.4 11/27/2022: Hemoglobin 10.6; Platelets 439 12/02/2022: BUN 55; Creatinine, Ser 1.70; Potassium 4.6; Sodium 138   Recent Lipid Panel Lab Results  Component Value Date/Time   CHOL 225 (H) 09/03/2015 02:48 AM   TRIG 128 08/16/2019 04:54 PM   HDL 43 09/03/2015 02:48 AM   LDLCALC 115 (H) 09/03/2015 02:48 AM    Risk Assessment/Calculations:                Physical Exam:    VS:  BP 128/84   Pulse 60   Ht 5\' 2"  (1.575 m)   Wt 245 lb (111.1 kg)   SpO2 90%   BMI 44.81 kg/m    Wt Readings from Last 3 Encounters:  12/31/22 245 lb (111.1 kg)  12/26/22 246 lb (111.6 kg)  12/23/22 251 lb 12.8 oz (114.2 kg)    GENERAL:  No apparent distress, AOx3 HEENT:  No carotid bruits, +2 carotid impulses, no scleral icterus CAR: RRR no murmurs, gallops, rubs, or thrills RES:  Clear to auscultation bilaterally ABD:  Soft, nontender, nondistended, positive bowel sounds x 4, obese VASC:  +2 radial pulses, +2 carotid pulses, palpable pedal pulses NEURO:  CN 2-12 grossly intact; motor and sensory grossly intact PSYCH:  No active depression or anxiety EXT:  No edema, ecchymosis, or cyanosis  Signed, Orbie Pyo, MD  12/31/2022 10:49 AM    [facility] [address] [place], [place], [place]  [address] Phone: [phone]; Fax: [phone]   Note:  This document was prepared using Dragon voice recognition software and may include unintentional dictation errors.

## 2022-12-30 DIAGNOSIS — G931 Anoxic brain damage, not elsewhere classified: Secondary | ICD-10-CM | POA: Diagnosis not present

## 2022-12-30 DIAGNOSIS — I5022 Chronic systolic (congestive) heart failure: Secondary | ICD-10-CM | POA: Diagnosis not present

## 2022-12-30 DIAGNOSIS — I69318 Other symptoms and signs involving cognitive functions following cerebral infarction: Secondary | ICD-10-CM | POA: Diagnosis not present

## 2022-12-30 DIAGNOSIS — N179 Acute kidney failure, unspecified: Secondary | ICD-10-CM | POA: Diagnosis not present

## 2022-12-30 DIAGNOSIS — N1832 Chronic kidney disease, stage 3b: Secondary | ICD-10-CM | POA: Diagnosis not present

## 2022-12-30 DIAGNOSIS — I4901 Ventricular fibrillation: Secondary | ICD-10-CM | POA: Diagnosis not present

## 2022-12-30 DIAGNOSIS — I130 Hypertensive heart and chronic kidney disease with heart failure and stage 1 through stage 4 chronic kidney disease, or unspecified chronic kidney disease: Secondary | ICD-10-CM | POA: Diagnosis not present

## 2022-12-30 DIAGNOSIS — I421 Obstructive hypertrophic cardiomyopathy: Secondary | ICD-10-CM | POA: Diagnosis not present

## 2022-12-30 DIAGNOSIS — I5033 Acute on chronic diastolic (congestive) heart failure: Secondary | ICD-10-CM | POA: Diagnosis not present

## 2022-12-30 NOTE — Telephone Encounter (Signed)
Apt 7/12

## 2022-12-31 ENCOUNTER — Ambulatory Visit: Payer: Medicare HMO | Attending: Internal Medicine | Admitting: Internal Medicine

## 2022-12-31 ENCOUNTER — Encounter: Payer: Self-pay | Admitting: Internal Medicine

## 2022-12-31 VITALS — BP 128/84 | HR 60 | Ht 62.0 in | Wt 245.0 lb

## 2022-12-31 DIAGNOSIS — I469 Cardiac arrest, cause unspecified: Secondary | ICD-10-CM

## 2022-12-31 DIAGNOSIS — F03A18 Unspecified dementia, mild, with other behavioral disturbance: Secondary | ICD-10-CM | POA: Diagnosis not present

## 2022-12-31 DIAGNOSIS — I5032 Chronic diastolic (congestive) heart failure: Secondary | ICD-10-CM | POA: Diagnosis not present

## 2022-12-31 DIAGNOSIS — Z6841 Body Mass Index (BMI) 40.0 and over, adult: Secondary | ICD-10-CM | POA: Diagnosis not present

## 2022-12-31 DIAGNOSIS — N1831 Chronic kidney disease, stage 3a: Secondary | ICD-10-CM | POA: Diagnosis not present

## 2022-12-31 DIAGNOSIS — E785 Hyperlipidemia, unspecified: Secondary | ICD-10-CM

## 2022-12-31 DIAGNOSIS — Z09 Encounter for follow-up examination after completed treatment for conditions other than malignant neoplasm: Secondary | ICD-10-CM | POA: Diagnosis not present

## 2022-12-31 DIAGNOSIS — I10 Essential (primary) hypertension: Secondary | ICD-10-CM

## 2022-12-31 DIAGNOSIS — R5381 Other malaise: Secondary | ICD-10-CM | POA: Diagnosis not present

## 2022-12-31 DIAGNOSIS — I700 Atherosclerosis of aorta: Secondary | ICD-10-CM | POA: Diagnosis not present

## 2022-12-31 DIAGNOSIS — Z7409 Other reduced mobility: Secondary | ICD-10-CM | POA: Diagnosis not present

## 2022-12-31 DIAGNOSIS — G479 Sleep disorder, unspecified: Secondary | ICD-10-CM | POA: Diagnosis not present

## 2022-12-31 DIAGNOSIS — I639 Cerebral infarction, unspecified: Secondary | ICD-10-CM | POA: Diagnosis not present

## 2022-12-31 DIAGNOSIS — Z789 Other specified health status: Secondary | ICD-10-CM | POA: Diagnosis not present

## 2022-12-31 MED ORDER — LOSARTAN POTASSIUM 25 MG PO TABS: 12.5000 mg | ORAL_TABLET | Freq: Every day | ORAL | 3 refills | Status: DC

## 2022-12-31 NOTE — Patient Instructions (Signed)
Medication Instructions:  Your physician has recommended you make the following change in your medication:  1.) start losartan 25 mg - take HALF tablet at bedtime daily  *If you need a refill on your cardiac medications before your next appointment, please call your pharmacy*   Lab Work: Please return in one week for blood work (cmet, lipids, Lp(a)  If you have labs (blood work) drawn today and your tests are completely normal, you will receive your results only by: MyChart Message (if you have MyChart) OR A paper copy in the mail If you have any lab test that is abnormal or we need to change your treatment, we will call you to review the results.   Testing/Procedures: No changes   Follow-Up: At [facility], you and your health needs are our priority.  As part of our continuing mission to provide you with exceptional heart care, we have created designated Provider Care Teams.  These Care Teams include your primary Cardiologist (physician) and Advanced Practice Providers (APPs -  Physician Assistants and Nurse Practitioners) who all work together to provide you with the care you need, when you need it.   Your next appointment:   3 month(s)  Provider:   Jari Favre, PA-C or other care team Advanced Practice Provider

## 2023-01-01 DIAGNOSIS — I421 Obstructive hypertrophic cardiomyopathy: Secondary | ICD-10-CM | POA: Diagnosis not present

## 2023-01-01 DIAGNOSIS — I5033 Acute on chronic diastolic (congestive) heart failure: Secondary | ICD-10-CM | POA: Diagnosis not present

## 2023-01-01 DIAGNOSIS — I130 Hypertensive heart and chronic kidney disease with heart failure and stage 1 through stage 4 chronic kidney disease, or unspecified chronic kidney disease: Secondary | ICD-10-CM | POA: Diagnosis not present

## 2023-01-01 DIAGNOSIS — N1832 Chronic kidney disease, stage 3b: Secondary | ICD-10-CM | POA: Diagnosis not present

## 2023-01-01 DIAGNOSIS — I69318 Other symptoms and signs involving cognitive functions following cerebral infarction: Secondary | ICD-10-CM | POA: Diagnosis not present

## 2023-01-01 DIAGNOSIS — I4901 Ventricular fibrillation: Secondary | ICD-10-CM | POA: Diagnosis not present

## 2023-01-01 DIAGNOSIS — N179 Acute kidney failure, unspecified: Secondary | ICD-10-CM | POA: Diagnosis not present

## 2023-01-01 DIAGNOSIS — I5022 Chronic systolic (congestive) heart failure: Secondary | ICD-10-CM | POA: Diagnosis not present

## 2023-01-01 DIAGNOSIS — G931 Anoxic brain damage, not elsewhere classified: Secondary | ICD-10-CM | POA: Diagnosis not present

## 2023-01-07 ENCOUNTER — Ambulatory Visit: Payer: Medicare HMO | Attending: Internal Medicine

## 2023-01-07 DIAGNOSIS — Z6841 Body Mass Index (BMI) 40.0 and over, adult: Secondary | ICD-10-CM | POA: Diagnosis not present

## 2023-01-07 DIAGNOSIS — I5032 Chronic diastolic (congestive) heart failure: Secondary | ICD-10-CM

## 2023-01-07 DIAGNOSIS — E785 Hyperlipidemia, unspecified: Secondary | ICD-10-CM | POA: Diagnosis not present

## 2023-01-07 DIAGNOSIS — I700 Atherosclerosis of aorta: Secondary | ICD-10-CM | POA: Diagnosis not present

## 2023-01-07 LAB — COMPREHENSIVE METABOLIC PANEL
AST: 27 IU/L (ref 0–40)
Bilirubin Total: <0.2 mg/dL (ref 0.0–1.2)
CO2: 26 mmol/L (ref 20–29)
Chloride: 100 mmol/L (ref 96–106)
Creatinine, Ser: 1.83 mg/dL — ABNORMAL HIGH (ref 0.57–1.00)
Globulin, Total: 3.1 g/dL (ref 1.5–4.5)
Potassium: 5.2 mmol/L (ref 3.5–5.2)

## 2023-01-07 LAB — LIPID PANEL
Chol/HDL Ratio: 3.1 ratio (ref 0.0–4.4)
LDL Chol Calc (NIH): 121 mg/dL — ABNORMAL HIGH (ref 0–99)

## 2023-01-07 LAB — LIPOPROTEIN A (LPA)

## 2023-01-09 DIAGNOSIS — I69318 Other symptoms and signs involving cognitive functions following cerebral infarction: Secondary | ICD-10-CM | POA: Diagnosis not present

## 2023-01-09 DIAGNOSIS — I130 Hypertensive heart and chronic kidney disease with heart failure and stage 1 through stage 4 chronic kidney disease, or unspecified chronic kidney disease: Secondary | ICD-10-CM | POA: Diagnosis not present

## 2023-01-09 DIAGNOSIS — I4901 Ventricular fibrillation: Secondary | ICD-10-CM | POA: Diagnosis not present

## 2023-01-09 DIAGNOSIS — I5022 Chronic systolic (congestive) heart failure: Secondary | ICD-10-CM | POA: Diagnosis not present

## 2023-01-09 DIAGNOSIS — G931 Anoxic brain damage, not elsewhere classified: Secondary | ICD-10-CM | POA: Diagnosis not present

## 2023-01-09 DIAGNOSIS — I5033 Acute on chronic diastolic (congestive) heart failure: Secondary | ICD-10-CM | POA: Diagnosis not present

## 2023-01-09 DIAGNOSIS — N179 Acute kidney failure, unspecified: Secondary | ICD-10-CM | POA: Diagnosis not present

## 2023-01-09 DIAGNOSIS — I421 Obstructive hypertrophic cardiomyopathy: Secondary | ICD-10-CM | POA: Diagnosis not present

## 2023-01-09 DIAGNOSIS — N1832 Chronic kidney disease, stage 3b: Secondary | ICD-10-CM | POA: Diagnosis not present

## 2023-01-09 NOTE — Progress Notes (Signed)
No ICM remote transmission received for 12/29/2022 and next ICM transmission scheduled for 01/19/2023.   

## 2023-01-10 LAB — LIPID PANEL
Cholesterol, Total: 212 mg/dL — ABNORMAL HIGH (ref 100–199)
HDL: 68 mg/dL (ref >39)
Triglycerides: 132 mg/dL (ref 0–149)
VLDL Cholesterol Cal: 23 mg/dL (ref 5–40)

## 2023-01-10 LAB — COMPREHENSIVE METABOLIC PANEL
ALT: 18 IU/L (ref 0–32)
Albumin: 3.8 g/dL — ABNORMAL LOW (ref 3.9–4.9)
Alkaline Phosphatase: 152 IU/L — ABNORMAL HIGH (ref 44–121)
BUN/Creatinine Ratio: 28 (ref 12–28)
BUN: 52 mg/dL — ABNORMAL HIGH (ref 8–27)
Calcium: 10.2 mg/dL (ref 8.7–10.3)
Glucose: 89 mg/dL (ref 70–99)
Sodium: 141 mmol/L (ref 134–144)
Total Protein: 6.9 g/dL (ref 6.0–8.5)
eGFR: 30 mL/min/{1.73_m2} — ABNORMAL LOW (ref >59)

## 2023-01-13 DIAGNOSIS — I5033 Acute on chronic diastolic (congestive) heart failure: Secondary | ICD-10-CM | POA: Diagnosis not present

## 2023-01-13 DIAGNOSIS — I421 Obstructive hypertrophic cardiomyopathy: Secondary | ICD-10-CM | POA: Diagnosis not present

## 2023-01-13 DIAGNOSIS — I130 Hypertensive heart and chronic kidney disease with heart failure and stage 1 through stage 4 chronic kidney disease, or unspecified chronic kidney disease: Secondary | ICD-10-CM | POA: Diagnosis not present

## 2023-01-13 DIAGNOSIS — I69318 Other symptoms and signs involving cognitive functions following cerebral infarction: Secondary | ICD-10-CM | POA: Diagnosis not present

## 2023-01-13 DIAGNOSIS — I5022 Chronic systolic (congestive) heart failure: Secondary | ICD-10-CM | POA: Diagnosis not present

## 2023-01-13 DIAGNOSIS — N179 Acute kidney failure, unspecified: Secondary | ICD-10-CM | POA: Diagnosis not present

## 2023-01-13 DIAGNOSIS — I4901 Ventricular fibrillation: Secondary | ICD-10-CM | POA: Diagnosis not present

## 2023-01-13 DIAGNOSIS — G931 Anoxic brain damage, not elsewhere classified: Secondary | ICD-10-CM | POA: Diagnosis not present

## 2023-01-13 DIAGNOSIS — N1832 Chronic kidney disease, stage 3b: Secondary | ICD-10-CM | POA: Diagnosis not present

## 2023-01-14 DIAGNOSIS — I130 Hypertensive heart and chronic kidney disease with heart failure and stage 1 through stage 4 chronic kidney disease, or unspecified chronic kidney disease: Secondary | ICD-10-CM | POA: Diagnosis not present

## 2023-01-14 DIAGNOSIS — I69318 Other symptoms and signs involving cognitive functions following cerebral infarction: Secondary | ICD-10-CM | POA: Diagnosis not present

## 2023-01-14 DIAGNOSIS — R300 Dysuria: Secondary | ICD-10-CM | POA: Diagnosis not present

## 2023-01-14 DIAGNOSIS — I4901 Ventricular fibrillation: Secondary | ICD-10-CM | POA: Diagnosis not present

## 2023-01-14 DIAGNOSIS — I421 Obstructive hypertrophic cardiomyopathy: Secondary | ICD-10-CM | POA: Diagnosis not present

## 2023-01-14 DIAGNOSIS — G931 Anoxic brain damage, not elsewhere classified: Secondary | ICD-10-CM | POA: Diagnosis not present

## 2023-01-14 DIAGNOSIS — N179 Acute kidney failure, unspecified: Secondary | ICD-10-CM | POA: Diagnosis not present

## 2023-01-14 DIAGNOSIS — I5022 Chronic systolic (congestive) heart failure: Secondary | ICD-10-CM | POA: Diagnosis not present

## 2023-01-14 DIAGNOSIS — I5033 Acute on chronic diastolic (congestive) heart failure: Secondary | ICD-10-CM | POA: Diagnosis not present

## 2023-01-14 DIAGNOSIS — N1832 Chronic kidney disease, stage 3b: Secondary | ICD-10-CM | POA: Diagnosis not present

## 2023-01-14 DIAGNOSIS — B3731 Acute candidiasis of vulva and vagina: Secondary | ICD-10-CM | POA: Diagnosis not present

## 2023-01-15 ENCOUNTER — Other Ambulatory Visit: Payer: Self-pay | Admitting: Physician Assistant

## 2023-01-15 ENCOUNTER — Other Ambulatory Visit: Payer: Self-pay

## 2023-01-15 DIAGNOSIS — I69318 Other symptoms and signs involving cognitive functions following cerebral infarction: Secondary | ICD-10-CM | POA: Diagnosis not present

## 2023-01-15 DIAGNOSIS — Z79899 Other long term (current) drug therapy: Secondary | ICD-10-CM

## 2023-01-15 DIAGNOSIS — E785 Hyperlipidemia, unspecified: Secondary | ICD-10-CM

## 2023-01-15 DIAGNOSIS — I5022 Chronic systolic (congestive) heart failure: Secondary | ICD-10-CM | POA: Diagnosis not present

## 2023-01-15 DIAGNOSIS — G931 Anoxic brain damage, not elsewhere classified: Secondary | ICD-10-CM | POA: Diagnosis not present

## 2023-01-15 DIAGNOSIS — I130 Hypertensive heart and chronic kidney disease with heart failure and stage 1 through stage 4 chronic kidney disease, or unspecified chronic kidney disease: Secondary | ICD-10-CM | POA: Diagnosis not present

## 2023-01-15 DIAGNOSIS — I4901 Ventricular fibrillation: Secondary | ICD-10-CM | POA: Diagnosis not present

## 2023-01-15 DIAGNOSIS — N179 Acute kidney failure, unspecified: Secondary | ICD-10-CM | POA: Diagnosis not present

## 2023-01-15 DIAGNOSIS — I421 Obstructive hypertrophic cardiomyopathy: Secondary | ICD-10-CM | POA: Diagnosis not present

## 2023-01-15 DIAGNOSIS — N1832 Chronic kidney disease, stage 3b: Secondary | ICD-10-CM | POA: Diagnosis not present

## 2023-01-15 DIAGNOSIS — R300 Dysuria: Secondary | ICD-10-CM | POA: Diagnosis not present

## 2023-01-15 DIAGNOSIS — I5033 Acute on chronic diastolic (congestive) heart failure: Secondary | ICD-10-CM | POA: Diagnosis not present

## 2023-01-15 MED ORDER — ATORVASTATIN CALCIUM 40 MG PO TABS: 40.0000 mg | ORAL_TABLET | Freq: Every day | ORAL | 3 refills | Status: DC

## 2023-01-15 NOTE — Telephone Encounter (Signed)
Has appt. tomorrow

## 2023-01-16 ENCOUNTER — Encounter: Payer: Self-pay | Admitting: Physician Assistant

## 2023-01-16 ENCOUNTER — Ambulatory Visit (INDEPENDENT_AMBULATORY_CARE_PROVIDER_SITE_OTHER): Payer: Medicare HMO | Admitting: Physician Assistant

## 2023-01-16 ENCOUNTER — Other Ambulatory Visit: Payer: Self-pay

## 2023-01-16 ENCOUNTER — Telehealth: Payer: Self-pay | Admitting: Physician Assistant

## 2023-01-16 DIAGNOSIS — F411 Generalized anxiety disorder: Secondary | ICD-10-CM

## 2023-01-16 DIAGNOSIS — R69 Illness, unspecified: Secondary | ICD-10-CM | POA: Diagnosis not present

## 2023-01-16 DIAGNOSIS — F5105 Insomnia due to other mental disorder: Secondary | ICD-10-CM

## 2023-01-16 DIAGNOSIS — F99 Mental disorder, not otherwise specified: Secondary | ICD-10-CM | POA: Diagnosis not present

## 2023-01-16 DIAGNOSIS — F319 Bipolar disorder, unspecified: Secondary | ICD-10-CM | POA: Diagnosis not present

## 2023-01-16 MED ORDER — BUSPIRONE HCL 5 MG PO TABS: 5.0000 mg | ORAL_TABLET | Freq: Two times a day (BID) | ORAL | 0 refills | Status: DC

## 2023-01-16 MED ORDER — BUSPIRONE HCL 5 MG PO TABS: 5.0000 mg | ORAL_TABLET | Freq: Two times a day (BID) | ORAL | 1 refills | Status: DC

## 2023-01-16 MED ORDER — OXCARBAZEPINE 300 MG PO TABS: 300.0000 mg | ORAL_TABLET | Freq: Two times a day (BID) | ORAL | 5 refills | Status: DC

## 2023-01-16 MED ORDER — MEMANTINE HCL 5 MG PO TABS: 5.0000 mg | ORAL_TABLET | Freq: Every day | ORAL | 1 refills | Status: DC

## 2023-01-16 NOTE — Progress Notes (Signed)
[facility]  Patient ID: Marie Ramos,  MRN: [id number]  PCP: Daisy Floro, MD  Date of Evaluation: 01/16/2023 Time spent: 42 minutes  Chief Complaint:  Chief Complaint   Follow-up    HISTORY/CURRENT STATUS: For hospital f/u.  Was in hosp for 2 weeks, then rehab for 3 weeks, now home for several weeks. Getting home health.   Buspar, decreased  Hosp d/c summary says cont xanax, trileptal 600 mg am and 300 pm, effexor xr 225 mg (75 per rehab) start buspar in rehab, xanax d/c somewhere along the way    Individual Medical History/ Review of Systems: Changes? :Yes   was hospitalized 11/24/2022.  See records on chart.  Past medications for mental health diagnoses include: Lithium caused diarrhea and increased thirst, Trileptal, Effexor XR, Seroquel worked well, Abilify unknown what happened, Adderall, Risperdal helped mania but caused bilateral lower extremity edema and dry mouth.  Allergies: Sulfa antibiotics, Albuterol, Other, Sulfamethoxazole-trimethoprim, Vicodin hp [hydrocodone-acetaminophen], Naproxen, and Vicodin [hydrocodone-acetaminophen]  Current Medications:  Current Outpatient Medications:    Acetylcysteine (NAC) 600 MG CAPS, Take 1 capsule (600 mg total) by mouth daily., Disp: 30 capsule, Rfl: 11   amLODipine (NORVASC) 5 MG tablet, Take 1 tablet (5 mg total) by mouth daily with breakfast., Disp: 30 tablet, Rfl: 0   amoxicillin (AMOXIL) 500 MG capsule, Take 500 mg by mouth 3 (three) times daily., Disp: , Rfl:    Ascorbic Acid (VITAMIN C PO), Take 1 capsule by mouth daily., Disp: , Rfl:    aspirin EC 81 MG tablet, Take 1 tablet (81 mg total) by mouth daily. Swallow whole., Disp: 90 tablet, Rfl: 3   atorvastatin (LIPITOR) 40 MG tablet, Take 1 tablet (40 mg total) by mouth daily., Disp: 90 tablet, Rfl: 3   busPIRone (BUSPAR) 5 MG tablet, Take 1 tablet (5 mg total) by mouth 2 (two) times daily., Disp: 60 tablet, Rfl: 0   carvedilol (COREG) 12.5 MG  tablet, TAKE ONE TABLET BY MOUTH TWICE DAILY WITH BREAKFAST AND DINNER, Disp: 180 tablet, Rfl: 2   cholecalciferol (VITAMIN D3) 25 MCG (1000 UNIT) tablet, Take 2,000 Units by mouth daily., Disp: , Rfl:    empagliflozin (JARDIANCE) 10 MG TABS tablet, Take 1 tablet (10 mg total) by mouth daily., Disp: 30 tablet, Rfl:    famotidine (PEPCID) 20 MG tablet, Take 20 mg by mouth at bedtime., Disp: , Rfl:    fexofenadine (ALLEGRA) 180 MG tablet, Take 180 mg by mouth daily., Disp: , Rfl:    fluticasone (FLONASE) 50 MCG/ACT nasal spray, Place 1 spray into both nostrils daily. Use as directed, Disp: , Rfl:    furosemide (LASIX) 40 MG tablet, Take 1 tablet (40 mg total) by mouth daily., Disp: 90 tablet, Rfl: 3   Ginkgo Biloba 40 MG TABS, Take 1 tablet (40 mg total) by mouth 2 (two) times daily., Disp: 180 tablet, Rfl: 11   losartan (COZAAR) 25 MG tablet, Take 0.5 tablets (12.5 mg total) by mouth at bedtime., Disp: 45 tablet, Rfl: 3   memantine (NAMENDA) 5 MG tablet, Take 1 tablet (5 mg total) by mouth at bedtime., Disp: 30 tablet, Rfl: 11   Multiple Vitamin (MULTIVITAMIN WITH MINERALS) TABS tablet, Take 1 tablet by mouth daily., Disp: , Rfl:    Multiple Vitamins-Minerals (OCUVITE PO), Take 1 capsule by mouth daily., Disp: , Rfl:    polyethylene glycol (MIRALAX / GLYCOLAX) 17 g packet, Take 17 g by mouth daily., Disp: 14 each, Rfl: 0   triamcinolone ointment (  KENALOG) 0.5 %, Apply 1 application topically as needed., Disp: , Rfl: 0   venlafaxine XR (EFFEXOR XR) 75 MG 24 hr capsule, Take 1 capsule (75 mg total) by mouth daily with breakfast. Take with Effexor XR 300 mg, Disp: 90 capsule, Rfl: 1   busPIRone (BUSPAR) 5 MG tablet, Take 1 tablet (5 mg total) by mouth 2 (two) times daily., Disp: 180 tablet, Rfl: 1   Oxcarbazepine (TRILEPTAL) 300 MG tablet, Take 1 tablet (300 mg total) by mouth 2 (two) times daily., Disp: 60 tablet, Rfl: 5 Medication Side Effects: none  Family Medical/ Social History: Changes?   no  MENTAL HEALTH EXAM:  There were no vitals taken for this visit.There is no height or weight on file to calculate BMI.  General Appearance:  Unable to assess  Eye Contact:   Unable to assess  Speech:  Clear and Coherent and Normal Rate  Volume:  Normal  Mood:  Euthymic  Affect:   Unable to assess        Thought Process:  Goal Directed and Descriptions of Associations: Circumstantial  Orientation:  Full (Time, Place, and Person)  Thought Content: Logical   Suicidal Thoughts:  No  Homicidal Thoughts:  No  Memory:  Immediate;   Fair Recent;   Fair Remote;   Good  Judgement:  Good  Insight:  Good  Psychomotor Activity:   Unable to assess  Concentration:  Concentration: Good and Attention Span: Good  Recall:  Good  Fund of Knowledge: Good  Language: Good  Assets:  Desire for Improvement Financial Resources/Insurance Housing Social Support Transportation  ADL's:  Intact  Cognition: WNL  Prognosis:  Good   DIAGNOSES:  No diagnosis found.  Receiving Psychotherapy: No   RECOMMENDATIONS:  PDMP reviewed. Last Xanax filled 10/24/2022. I provided 42 minutes of face to face time during this encounter, including time spent before and after the visit in records review, medical decision making, counseling pertinent to today's visit, and charting.        Continue Xanax 0.25 mg, 1 p.o. nightly as needed sleep or anxiety.                  oxcarbazepine 300 mg to 2 p.o. every morning and 1 p.o. nightly. Continue  Effexor XR 75 mg, 1 p.o. daily. Continue NAC, multivitamin, B complex, fish oil, vitamin D 2000 IUs daily, ginkgo biloba. Return in 4           Avoid drugs that are known to cause prolonged QT interval  Melony Overly, PA-C

## 2023-01-16 NOTE — Patient Instructions (Addendum)
Decrease oxcarbazepine to 300 mg, twice a day. Continue Effexor XR 75 mg daily.  Continue Buspar 5 mg, 1 twice a day.

## 2023-01-16 NOTE — Telephone Encounter (Signed)
Sent meds to pharmacy to [facility]

## 2023-01-16 NOTE — Telephone Encounter (Signed)
Pt needs a refill on the mematine  called into the [facility]

## 2023-01-19 ENCOUNTER — Ambulatory Visit: Payer: Medicare HMO | Attending: Cardiology

## 2023-01-19 DIAGNOSIS — I5032 Chronic diastolic (congestive) heart failure: Secondary | ICD-10-CM | POA: Diagnosis not present

## 2023-01-19 DIAGNOSIS — G931 Anoxic brain damage, not elsewhere classified: Secondary | ICD-10-CM | POA: Diagnosis not present

## 2023-01-19 DIAGNOSIS — I4901 Ventricular fibrillation: Secondary | ICD-10-CM | POA: Diagnosis not present

## 2023-01-19 DIAGNOSIS — I5033 Acute on chronic diastolic (congestive) heart failure: Secondary | ICD-10-CM | POA: Diagnosis not present

## 2023-01-19 DIAGNOSIS — N179 Acute kidney failure, unspecified: Secondary | ICD-10-CM | POA: Diagnosis not present

## 2023-01-19 DIAGNOSIS — I421 Obstructive hypertrophic cardiomyopathy: Secondary | ICD-10-CM | POA: Diagnosis not present

## 2023-01-19 DIAGNOSIS — I69318 Other symptoms and signs involving cognitive functions following cerebral infarction: Secondary | ICD-10-CM | POA: Diagnosis not present

## 2023-01-19 DIAGNOSIS — Z95810 Presence of automatic (implantable) cardiac defibrillator: Secondary | ICD-10-CM

## 2023-01-19 DIAGNOSIS — I130 Hypertensive heart and chronic kidney disease with heart failure and stage 1 through stage 4 chronic kidney disease, or unspecified chronic kidney disease: Secondary | ICD-10-CM | POA: Diagnosis not present

## 2023-01-19 DIAGNOSIS — N1832 Chronic kidney disease, stage 3b: Secondary | ICD-10-CM | POA: Diagnosis not present

## 2023-01-19 DIAGNOSIS — I5022 Chronic systolic (congestive) heart failure: Secondary | ICD-10-CM | POA: Diagnosis not present

## 2023-01-20 ENCOUNTER — Other Ambulatory Visit: Payer: Self-pay

## 2023-01-20 ENCOUNTER — Telehealth: Payer: Self-pay | Admitting: Physician Assistant

## 2023-01-20 ENCOUNTER — Telehealth: Payer: Self-pay

## 2023-01-20 DIAGNOSIS — I5022 Chronic systolic (congestive) heart failure: Secondary | ICD-10-CM | POA: Diagnosis not present

## 2023-01-20 DIAGNOSIS — I5033 Acute on chronic diastolic (congestive) heart failure: Secondary | ICD-10-CM | POA: Diagnosis not present

## 2023-01-20 DIAGNOSIS — G931 Anoxic brain damage, not elsewhere classified: Secondary | ICD-10-CM | POA: Diagnosis not present

## 2023-01-20 DIAGNOSIS — N179 Acute kidney failure, unspecified: Secondary | ICD-10-CM | POA: Diagnosis not present

## 2023-01-20 DIAGNOSIS — N1832 Chronic kidney disease, stage 3b: Secondary | ICD-10-CM | POA: Diagnosis not present

## 2023-01-20 DIAGNOSIS — I69318 Other symptoms and signs involving cognitive functions following cerebral infarction: Secondary | ICD-10-CM | POA: Diagnosis not present

## 2023-01-20 DIAGNOSIS — I421 Obstructive hypertrophic cardiomyopathy: Secondary | ICD-10-CM | POA: Diagnosis not present

## 2023-01-20 DIAGNOSIS — I4901 Ventricular fibrillation: Secondary | ICD-10-CM | POA: Diagnosis not present

## 2023-01-20 DIAGNOSIS — I130 Hypertensive heart and chronic kidney disease with heart failure and stage 1 through stage 4 chronic kidney disease, or unspecified chronic kidney disease: Secondary | ICD-10-CM | POA: Diagnosis not present

## 2023-01-20 MED ORDER — EMPAGLIFLOZIN 10 MG PO TABS: 10.0000 mg | ORAL_TABLET | Freq: Every day | ORAL | 11 refills | Status: DC

## 2023-01-20 NOTE — Progress Notes (Signed)
EPIC Encounter for ICM Monitoring  Patient Name: Marie Ramos is a 67 y.o. female Date: 01/20/2023 Primary Care Physican: Daisy Floro, MD Primary Cardiologist: Lynnette Caffey Electrophysiologist: Elberta Fortis 09/02/2022 Weight: 248 lbs  09/08/2022 Weight: 248 lbs 09/29/2022 Weight: 248 lbs 10/21/2022 Weight: 248 lbs 12/31/2022 Office Weight: 245 lbs   Time in AT/AF  0.0 hr/day (0.0%)          Attempted call to patient and unable to reach.  Transmission reviewed.                  Diet:  Tries to limit salt intake.         Optivol thoracic impedance suggesting possible fluid accumulation starting 7/2 an returning to baseline.     Prescribed:  Furosemide 40 mg Take 1 tablet (40 mg total) by mouth daily.      Jardiance 10 mg take 1 tablet by mouth daily.   Labs: 01/07/2023 Creatinine 1.83, BUN 52, Potassium 5.2, Sodium 141, GFR 30 A complete set of results can be found in Results Review.   Recommendations:  Unable to reach.     Follow-up plan: ICM clinic phone appointment on 02/02/2023 to recheck fluid levels.  91 day device clinic remote transmission 02/12/2023.      EP/Cardiology Office Visits:  04/03/2023 with Jari Favre, PA.   Recall 08/02/2023 with Francis Dowse, PA.     Copy of ICM check sent to Dr. Elberta Fortis.  Will send to Dr Lynnette Caffey for review if patient is reached.   3 month ICM trend: 01/19/2023.    12-14 Month ICM trend:     [name], RN 01/20/2023 2:01 PM

## 2023-01-20 NOTE — Telephone Encounter (Signed)
 Has RF available.

## 2023-01-20 NOTE — Telephone Encounter (Signed)
 Remote ICM transmission received.  Attempted call to patient regarding ICM remote transmission and no answer or voice mail option.  

## 2023-01-20 NOTE — Telephone Encounter (Signed)
Pt's medication was sent to pt's pharmacy as requested. Confirmation received.  °

## 2023-01-20 NOTE — Telephone Encounter (Signed)
Next visit is 02/19/23. Requesting refill on Buspirone 5 mg called to:  [facility] [address] [place] [address], [place] - [address]   Phone: [phone]  Fax: [phone]

## 2023-01-21 DIAGNOSIS — I69318 Other symptoms and signs involving cognitive functions following cerebral infarction: Secondary | ICD-10-CM | POA: Diagnosis not present

## 2023-01-21 DIAGNOSIS — I5033 Acute on chronic diastolic (congestive) heart failure: Secondary | ICD-10-CM | POA: Diagnosis not present

## 2023-01-21 DIAGNOSIS — I5022 Chronic systolic (congestive) heart failure: Secondary | ICD-10-CM | POA: Diagnosis not present

## 2023-01-21 DIAGNOSIS — I421 Obstructive hypertrophic cardiomyopathy: Secondary | ICD-10-CM | POA: Diagnosis not present

## 2023-01-21 DIAGNOSIS — I130 Hypertensive heart and chronic kidney disease with heart failure and stage 1 through stage 4 chronic kidney disease, or unspecified chronic kidney disease: Secondary | ICD-10-CM | POA: Diagnosis not present

## 2023-01-21 DIAGNOSIS — N179 Acute kidney failure, unspecified: Secondary | ICD-10-CM | POA: Diagnosis not present

## 2023-01-21 DIAGNOSIS — G931 Anoxic brain damage, not elsewhere classified: Secondary | ICD-10-CM | POA: Diagnosis not present

## 2023-01-21 DIAGNOSIS — I4901 Ventricular fibrillation: Secondary | ICD-10-CM | POA: Diagnosis not present

## 2023-01-21 DIAGNOSIS — N1832 Chronic kidney disease, stage 3b: Secondary | ICD-10-CM | POA: Diagnosis not present

## 2023-01-28 ENCOUNTER — Telehealth: Payer: Self-pay | Admitting: Physician Assistant

## 2023-01-28 NOTE — Telephone Encounter (Signed)
LVM to [facility]. Which pharmacy?

## 2023-01-28 NOTE — Telephone Encounter (Signed)
Pt called asking for venlafaxine refill 75 mg. It looks like the script that [facility] sent in may printed instead of going escribe. Please resend

## 2023-01-29 DIAGNOSIS — N1832 Chronic kidney disease, stage 3b: Secondary | ICD-10-CM | POA: Diagnosis not present

## 2023-01-29 DIAGNOSIS — I421 Obstructive hypertrophic cardiomyopathy: Secondary | ICD-10-CM | POA: Diagnosis not present

## 2023-01-29 DIAGNOSIS — I130 Hypertensive heart and chronic kidney disease with heart failure and stage 1 through stage 4 chronic kidney disease, or unspecified chronic kidney disease: Secondary | ICD-10-CM | POA: Diagnosis not present

## 2023-01-29 DIAGNOSIS — I4901 Ventricular fibrillation: Secondary | ICD-10-CM | POA: Diagnosis not present

## 2023-01-29 DIAGNOSIS — I69318 Other symptoms and signs involving cognitive functions following cerebral infarction: Secondary | ICD-10-CM | POA: Diagnosis not present

## 2023-01-29 DIAGNOSIS — N179 Acute kidney failure, unspecified: Secondary | ICD-10-CM | POA: Diagnosis not present

## 2023-01-29 DIAGNOSIS — I5033 Acute on chronic diastolic (congestive) heart failure: Secondary | ICD-10-CM | POA: Diagnosis not present

## 2023-01-29 DIAGNOSIS — G931 Anoxic brain damage, not elsewhere classified: Secondary | ICD-10-CM | POA: Diagnosis not present

## 2023-01-29 DIAGNOSIS — I5022 Chronic systolic (congestive) heart failure: Secondary | ICD-10-CM | POA: Diagnosis not present

## 2023-01-29 MED ORDER — VENLAFAXINE HCL ER 75 MG PO CP24: 75.0000 mg | ORAL_CAPSULE | Freq: Every day | ORAL | 0 refills | Status: DC

## 2023-01-29 NOTE — Telephone Encounter (Signed)
Contacted husband and he said to send to [facility].

## 2023-01-29 NOTE — Telephone Encounter (Signed)
Left second VM. She uses [facility] and [facility]. Just need to know which pharmacy to send Rx to.

## 2023-01-29 NOTE — Telephone Encounter (Signed)
Rx sent 

## 2023-01-30 DIAGNOSIS — I130 Hypertensive heart and chronic kidney disease with heart failure and stage 1 through stage 4 chronic kidney disease, or unspecified chronic kidney disease: Secondary | ICD-10-CM | POA: Diagnosis not present

## 2023-01-30 DIAGNOSIS — I5022 Chronic systolic (congestive) heart failure: Secondary | ICD-10-CM | POA: Diagnosis not present

## 2023-01-30 DIAGNOSIS — I421 Obstructive hypertrophic cardiomyopathy: Secondary | ICD-10-CM | POA: Diagnosis not present

## 2023-01-30 DIAGNOSIS — I69318 Other symptoms and signs involving cognitive functions following cerebral infarction: Secondary | ICD-10-CM | POA: Diagnosis not present

## 2023-01-30 DIAGNOSIS — G931 Anoxic brain damage, not elsewhere classified: Secondary | ICD-10-CM | POA: Diagnosis not present

## 2023-01-30 DIAGNOSIS — N179 Acute kidney failure, unspecified: Secondary | ICD-10-CM | POA: Diagnosis not present

## 2023-01-30 DIAGNOSIS — N1832 Chronic kidney disease, stage 3b: Secondary | ICD-10-CM | POA: Diagnosis not present

## 2023-01-30 DIAGNOSIS — I4901 Ventricular fibrillation: Secondary | ICD-10-CM | POA: Diagnosis not present

## 2023-01-30 DIAGNOSIS — I5033 Acute on chronic diastolic (congestive) heart failure: Secondary | ICD-10-CM | POA: Diagnosis not present

## 2023-02-02 ENCOUNTER — Ambulatory Visit: Payer: Medicare HMO | Attending: Cardiology

## 2023-02-02 ENCOUNTER — Telehealth: Payer: Self-pay

## 2023-02-02 DIAGNOSIS — I5033 Acute on chronic diastolic (congestive) heart failure: Secondary | ICD-10-CM | POA: Diagnosis not present

## 2023-02-02 DIAGNOSIS — N1832 Chronic kidney disease, stage 3b: Secondary | ICD-10-CM | POA: Diagnosis not present

## 2023-02-02 DIAGNOSIS — I4901 Ventricular fibrillation: Secondary | ICD-10-CM | POA: Diagnosis not present

## 2023-02-02 DIAGNOSIS — I421 Obstructive hypertrophic cardiomyopathy: Secondary | ICD-10-CM | POA: Diagnosis not present

## 2023-02-02 DIAGNOSIS — I5022 Chronic systolic (congestive) heart failure: Secondary | ICD-10-CM | POA: Diagnosis not present

## 2023-02-02 DIAGNOSIS — I130 Hypertensive heart and chronic kidney disease with heart failure and stage 1 through stage 4 chronic kidney disease, or unspecified chronic kidney disease: Secondary | ICD-10-CM | POA: Diagnosis not present

## 2023-02-02 DIAGNOSIS — I69318 Other symptoms and signs involving cognitive functions following cerebral infarction: Secondary | ICD-10-CM | POA: Diagnosis not present

## 2023-02-02 DIAGNOSIS — N179 Acute kidney failure, unspecified: Secondary | ICD-10-CM | POA: Diagnosis not present

## 2023-02-02 DIAGNOSIS — Z95810 Presence of automatic (implantable) cardiac defibrillator: Secondary | ICD-10-CM

## 2023-02-02 DIAGNOSIS — I5032 Chronic diastolic (congestive) heart failure: Secondary | ICD-10-CM

## 2023-02-02 DIAGNOSIS — G931 Anoxic brain damage, not elsewhere classified: Secondary | ICD-10-CM | POA: Diagnosis not present

## 2023-02-02 NOTE — Progress Notes (Signed)
EPIC Encounter for ICM Monitoring  Patient Name: Marie Ramos is a 67 y.o. female Date: 02/02/2023 Primary Care Physican: Daisy Floro, MD Primary Cardiologist: Lynnette Caffey Electrophysiologist: Elberta Fortis 09/02/2022 Weight: 248 lbs  09/08/2022 Weight: 248 lbs 09/29/2022 Weight: 248 lbs 10/21/2022 Weight: 248 lbs 12/31/2022 Office Weight: 245 lbs   Time in AT/AF  0.0 hr/day (0.0%)          Attempted call to patient and unable to reach.   Transmission reviewed.               Diet:  Tries to limit salt intake.         Optivol thoracic impedance suggesting possible fluid accumulation starting 7/2.     Prescribed:  Furosemide 40 mg Take 1 tablet (40 mg total) by mouth daily.      Jardiance 10 mg take 1 tablet by mouth daily.   Labs: 01/07/2023 Creatinine 1.83, BUN 52, Potassium 5.2, Sodium 141, GFR 30 A complete set of results can be found in Results Review.   Recommendations:  Unable to reach.     Follow-up plan: ICM clinic phone appointment on 02/09/2023 to recheck fluid levels.  91 day device clinic remote transmission 02/12/2023.      EP/Cardiology Office Visits:  04/03/2023 with Jari Favre, PA.   Recall 08/02/2023 with Francis Dowse, PA.     Copy of ICM check sent to Dr. Elberta Fortis.  Will send to Dr Lynnette Caffey for review if patient is reached.    3 month ICM trend: 02/02/2023.    12-14 Month ICM trend:     [name], RN 02/02/2023 10:37 AM

## 2023-02-02 NOTE — Telephone Encounter (Signed)
Remote ICM transmission received.  Attempted call to patient regarding ICM remote transmission and no answer or voice mail option.  

## 2023-02-03 DIAGNOSIS — G4719 Other hypersomnia: Secondary | ICD-10-CM | POA: Diagnosis not present

## 2023-02-03 DIAGNOSIS — F03A18 Unspecified dementia, mild, with other behavioral disturbance: Secondary | ICD-10-CM | POA: Diagnosis not present

## 2023-02-03 DIAGNOSIS — I10 Essential (primary) hypertension: Secondary | ICD-10-CM | POA: Diagnosis not present

## 2023-02-03 DIAGNOSIS — I5032 Chronic diastolic (congestive) heart failure: Secondary | ICD-10-CM | POA: Diagnosis not present

## 2023-02-03 DIAGNOSIS — Z7409 Other reduced mobility: Secondary | ICD-10-CM | POA: Diagnosis not present

## 2023-02-03 DIAGNOSIS — I679 Cerebrovascular disease, unspecified: Secondary | ICD-10-CM | POA: Diagnosis not present

## 2023-02-03 DIAGNOSIS — F319 Bipolar disorder, unspecified: Secondary | ICD-10-CM | POA: Diagnosis not present

## 2023-02-04 DIAGNOSIS — N179 Acute kidney failure, unspecified: Secondary | ICD-10-CM | POA: Diagnosis not present

## 2023-02-04 DIAGNOSIS — N1832 Chronic kidney disease, stage 3b: Secondary | ICD-10-CM | POA: Diagnosis not present

## 2023-02-04 DIAGNOSIS — G931 Anoxic brain damage, not elsewhere classified: Secondary | ICD-10-CM | POA: Diagnosis not present

## 2023-02-04 DIAGNOSIS — I130 Hypertensive heart and chronic kidney disease with heart failure and stage 1 through stage 4 chronic kidney disease, or unspecified chronic kidney disease: Secondary | ICD-10-CM | POA: Diagnosis not present

## 2023-02-04 DIAGNOSIS — I5022 Chronic systolic (congestive) heart failure: Secondary | ICD-10-CM | POA: Diagnosis not present

## 2023-02-04 DIAGNOSIS — I5033 Acute on chronic diastolic (congestive) heart failure: Secondary | ICD-10-CM | POA: Diagnosis not present

## 2023-02-04 DIAGNOSIS — I421 Obstructive hypertrophic cardiomyopathy: Secondary | ICD-10-CM | POA: Diagnosis not present

## 2023-02-04 DIAGNOSIS — I4901 Ventricular fibrillation: Secondary | ICD-10-CM | POA: Diagnosis not present

## 2023-02-04 DIAGNOSIS — I69318 Other symptoms and signs involving cognitive functions following cerebral infarction: Secondary | ICD-10-CM | POA: Diagnosis not present

## 2023-02-09 ENCOUNTER — Ambulatory Visit: Payer: Medicare HMO | Attending: Cardiology

## 2023-02-09 ENCOUNTER — Telehealth: Payer: Self-pay

## 2023-02-09 DIAGNOSIS — I5032 Chronic diastolic (congestive) heart failure: Secondary | ICD-10-CM

## 2023-02-09 DIAGNOSIS — I69318 Other symptoms and signs involving cognitive functions following cerebral infarction: Secondary | ICD-10-CM | POA: Diagnosis not present

## 2023-02-09 DIAGNOSIS — N1832 Chronic kidney disease, stage 3b: Secondary | ICD-10-CM | POA: Diagnosis not present

## 2023-02-09 DIAGNOSIS — I5033 Acute on chronic diastolic (congestive) heart failure: Secondary | ICD-10-CM | POA: Diagnosis not present

## 2023-02-09 DIAGNOSIS — I421 Obstructive hypertrophic cardiomyopathy: Secondary | ICD-10-CM | POA: Diagnosis not present

## 2023-02-09 DIAGNOSIS — Z95810 Presence of automatic (implantable) cardiac defibrillator: Secondary | ICD-10-CM

## 2023-02-09 DIAGNOSIS — I5022 Chronic systolic (congestive) heart failure: Secondary | ICD-10-CM | POA: Diagnosis not present

## 2023-02-09 DIAGNOSIS — I4901 Ventricular fibrillation: Secondary | ICD-10-CM | POA: Diagnosis not present

## 2023-02-09 DIAGNOSIS — N179 Acute kidney failure, unspecified: Secondary | ICD-10-CM | POA: Diagnosis not present

## 2023-02-09 DIAGNOSIS — G931 Anoxic brain damage, not elsewhere classified: Secondary | ICD-10-CM | POA: Diagnosis not present

## 2023-02-09 DIAGNOSIS — I130 Hypertensive heart and chronic kidney disease with heart failure and stage 1 through stage 4 chronic kidney disease, or unspecified chronic kidney disease: Secondary | ICD-10-CM | POA: Diagnosis not present

## 2023-02-09 NOTE — Telephone Encounter (Signed)
Remote ICM transmission received.  Attempted call to patient regarding ICM remote transmission and left message per DPR with ICM phone number to return call.

## 2023-02-09 NOTE — Progress Notes (Signed)
EPIC Encounter for ICM Monitoring  Patient Name: Marie Ramos is a 67 y.o. female Date: 02/09/2023 Primary Care Physican: Daisy Floro, MD Primary Cardiologist: Lynnette Caffey Electrophysiologist: Elberta Fortis 09/02/2022 Weight: 248 lbs  09/08/2022 Weight: 248 lbs 09/29/2022 Weight: 248 lbs 10/21/2022 Weight: 248 lbs 12/31/2022 Office Weight: 245 lbs   Time in AT/AF  0.0 hr/day (0.0%)          Attempted call to patient and unable to reach.   Transmission reviewed.               Diet:  Tries to limit salt intake.         Optivol thoracic impedance suggesting possible fluid accumulation starting 7/2.     Prescribed:  Furosemide 40 mg Take 1 tablet (40 mg total) by mouth daily.      Jardiance 10 mg take 1 tablet by mouth daily.   Labs: 01/07/2023 Creatinine 1.83, BUN 52, Potassium 5.2, Sodium 141, GFR 30 A complete set of results can be found in Results Review.   Recommendations:  Unable to reach.     Follow-up plan: ICM clinic phone appointment on 02/23/2023.  91 day device clinic remote transmission 02/12/2023.      EP/Cardiology Office Visits:  04/03/2023 with Jari Favre, PA.   Recall 08/02/2023 with Francis Dowse, PA.     Copy of ICM check sent to Dr. Elberta Fortis.  Will send to Dr Lynnette Caffey for review if patient is reached.   3 month ICM trend: 02/09/2023.    12-14 Month ICM trend:     [name], RN 02/09/2023 3:51 PM

## 2023-02-12 ENCOUNTER — Ambulatory Visit (INDEPENDENT_AMBULATORY_CARE_PROVIDER_SITE_OTHER): Payer: Medicare HMO

## 2023-02-12 DIAGNOSIS — I5032 Chronic diastolic (congestive) heart failure: Secondary | ICD-10-CM

## 2023-02-12 DIAGNOSIS — I469 Cardiac arrest, cause unspecified: Secondary | ICD-10-CM

## 2023-02-16 DIAGNOSIS — I5022 Chronic systolic (congestive) heart failure: Secondary | ICD-10-CM | POA: Diagnosis not present

## 2023-02-16 DIAGNOSIS — G931 Anoxic brain damage, not elsewhere classified: Secondary | ICD-10-CM | POA: Diagnosis not present

## 2023-02-16 DIAGNOSIS — N179 Acute kidney failure, unspecified: Secondary | ICD-10-CM | POA: Diagnosis not present

## 2023-02-16 DIAGNOSIS — I4901 Ventricular fibrillation: Secondary | ICD-10-CM | POA: Diagnosis not present

## 2023-02-16 DIAGNOSIS — I5033 Acute on chronic diastolic (congestive) heart failure: Secondary | ICD-10-CM | POA: Diagnosis not present

## 2023-02-16 DIAGNOSIS — N1832 Chronic kidney disease, stage 3b: Secondary | ICD-10-CM | POA: Diagnosis not present

## 2023-02-16 DIAGNOSIS — I69318 Other symptoms and signs involving cognitive functions following cerebral infarction: Secondary | ICD-10-CM | POA: Diagnosis not present

## 2023-02-16 DIAGNOSIS — I421 Obstructive hypertrophic cardiomyopathy: Secondary | ICD-10-CM | POA: Diagnosis not present

## 2023-02-16 DIAGNOSIS — I130 Hypertensive heart and chronic kidney disease with heart failure and stage 1 through stage 4 chronic kidney disease, or unspecified chronic kidney disease: Secondary | ICD-10-CM | POA: Diagnosis not present

## 2023-02-19 ENCOUNTER — Encounter: Payer: Self-pay | Admitting: Physician Assistant

## 2023-02-19 ENCOUNTER — Ambulatory Visit: Payer: Medicare HMO | Admitting: Physician Assistant

## 2023-02-19 DIAGNOSIS — F411 Generalized anxiety disorder: Secondary | ICD-10-CM | POA: Diagnosis not present

## 2023-02-19 DIAGNOSIS — F99 Mental disorder, not otherwise specified: Secondary | ICD-10-CM

## 2023-02-19 DIAGNOSIS — F319 Bipolar disorder, unspecified: Secondary | ICD-10-CM | POA: Diagnosis not present

## 2023-02-19 DIAGNOSIS — F5105 Insomnia due to other mental disorder: Secondary | ICD-10-CM

## 2023-02-19 DIAGNOSIS — Z79899 Other long term (current) drug therapy: Secondary | ICD-10-CM

## 2023-02-19 DIAGNOSIS — R9431 Abnormal electrocardiogram [ECG] [EKG]: Secondary | ICD-10-CM | POA: Diagnosis not present

## 2023-02-19 DIAGNOSIS — R69 Illness, unspecified: Secondary | ICD-10-CM

## 2023-02-19 MED ORDER — OXCARBAZEPINE 300 MG PO TABS: ORAL_TABLET | ORAL | Status: DC

## 2023-02-19 NOTE — Progress Notes (Signed)
[facility]  Patient ID: Marie Ramos,  MRN: [id number]  PCP: Daisy Floro, MD  Date of Evaluation: 02/19/2023 Time spent:25 minutes  Chief Complaint:  Chief Complaint   Anxiety; Depression; Follow-up    HISTORY/CURRENT STATUS: For medication follow-up.  Husband with her.   Drenda Freeze states she is doing pretty well.  Her husband agrees.  She is still getting PT Home health and is getting stronger.  She is able to walk around the house more with her walker or cane.  The hospitalization in rehab from earlier this summer has really taken a toll on her.  "I am gradually getting better though."  Denies any symptoms of depression.  She does not get out of the house or do anything except watch TV, but it is due to her physical limitations right now.  She has always enjoyed baking but has not felt like it yet.  Energy and motivation are fair.  Again it is due to the physical limitations.  Appetite is normal.  She has lost a few pounds since the last visit, she has been trying to lose weight.  Personal hygiene is normal.  No complaints of extreme anxiety.  She does feel overwhelmed at times though.  She is sleeping well.  No suicidal or homicidal thoughts.  A month ago we decreased her Trileptal, to get her on an as the lowest therapeutic dose possible.  She has tolerated it well.  Has had no manic symptoms. Patient denies increased energy with decreased need for sleep, increased talkativeness, racing thoughts, impulsivity or risky behaviors, increased spending, increased libido, grandiosity, increased irritability or anger, paranoia, or hallucinations.  Review of Systems  Constitutional:  Positive for malaise/fatigue.  HENT: Negative.    Eyes: Negative.   Respiratory: Negative.    Cardiovascular: Negative.   Gastrointestinal: Negative.   Genitourinary: Negative.   Musculoskeletal:        Chronic arthritic pain in multiple joints. No change  Skin: Negative.   Neurological:  Negative.   Endo/Heme/Allergies: Negative.   Psychiatric/Behavioral:         See HPI   Individual Medical History/ Review of Systems: Changes? :No     Past medications for mental health diagnoses include: Lithium caused diarrhea and increased thirst, Trileptal, Effexor XR, Seroquel worked well, Abilify unknown what happened, Adderall, Risperdal helped mania but caused bilateral lower extremity edema and dry mouth.  Allergies: Sulfa antibiotics, Albuterol, Other, Sulfamethoxazole-trimethoprim, Vicodin hp [hydrocodone-acetaminophen], Naproxen, and Vicodin [hydrocodone-acetaminophen]  Current Medications:  Current Outpatient Medications:    Acetylcysteine (NAC) 600 MG CAPS, Take 1 capsule (600 mg total) by mouth daily., Disp: 30 capsule, Rfl: 11   amLODipine (NORVASC) 5 MG tablet, Take 1 tablet (5 mg total) by mouth daily with breakfast., Disp: 30 tablet, Rfl: 0   Ascorbic Acid (VITAMIN C PO), Take 1 capsule by mouth daily., Disp: , Rfl:    aspirin EC 81 MG tablet, Take 1 tablet (81 mg total) by mouth daily. Swallow whole., Disp: 90 tablet, Rfl: 3   atorvastatin (LIPITOR) 40 MG tablet, Take 1 tablet (40 mg total) by mouth daily., Disp: 90 tablet, Rfl: 3   busPIRone (BUSPAR) 5 MG tablet, Take 1 tablet (5 mg total) by mouth 2 (two) times daily., Disp: 180 tablet, Rfl: 1   carvedilol (COREG) 12.5 MG tablet, TAKE ONE TABLET BY MOUTH TWICE DAILY WITH BREAKFAST AND DINNER, Disp: 180 tablet, Rfl: 2   cholecalciferol (VITAMIN D3) 25 MCG (1000 UNIT) tablet, Take 2,000 Units by mouth  daily., Disp: , Rfl:    empagliflozin (JARDIANCE) 10 MG TABS tablet, Take 1 tablet (10 mg total) by mouth daily., Disp: 30 tablet, Rfl: 11   famotidine (PEPCID) 20 MG tablet, Take 20 mg by mouth at bedtime., Disp: , Rfl:    fexofenadine (ALLEGRA) 180 MG tablet, Take 180 mg by mouth daily., Disp: , Rfl:    fluticasone (FLONASE) 50 MCG/ACT nasal spray, Place 1 spray into both nostrils daily. Use as directed, Disp: , Rfl:     furosemide (LASIX) 40 MG tablet, Take 1 tablet (40 mg total) by mouth daily., Disp: 90 tablet, Rfl: 3   Ginkgo Biloba 40 MG TABS, Take 1 tablet (40 mg total) by mouth 2 (two) times daily., Disp: 180 tablet, Rfl: 11   losartan (COZAAR) 25 MG tablet, Take 0.5 tablets (12.5 mg total) by mouth at bedtime., Disp: 45 tablet, Rfl: 3   memantine (NAMENDA) 5 MG tablet, Take 1 tablet (5 mg total) by mouth at bedtime., Disp: 90 tablet, Rfl: 1   Multiple Vitamin (MULTIVITAMIN WITH MINERALS) TABS tablet, Take 1 tablet by mouth daily., Disp: , Rfl:    Multiple Vitamins-Minerals (OCUVITE PO), Take 1 capsule by mouth daily., Disp: , Rfl:    polyethylene glycol (MIRALAX / GLYCOLAX) 17 g packet, Take 17 g by mouth daily., Disp: 14 each, Rfl: 0   triamcinolone ointment (KENALOG) 0.5 %, Apply 1 application topically as needed., Disp: , Rfl: 0   venlafaxine XR (EFFEXOR XR) 75 MG 24 hr capsule, Take 1 capsule (75 mg total) by mouth daily with breakfast. (Patient taking differently: Take 75 mg by mouth 2 (two) times daily.), Disp: 90 capsule, Rfl: 0   Oxcarbazepine (TRILEPTAL) 300 MG tablet, 1/2 po q am, 1 po at bedtime., Disp: , Rfl:  Medication Side Effects: none  Family Medical/ Social History: Changes?  no  MENTAL HEALTH EXAM:  There were no vitals taken for this visit.There is no height or weight on file to calculate BMI.  General Appearance: Casual, Well Groomed, and Obese  Eye Contact:  Good  Speech:  Clear and Coherent and Normal Rate  Volume:  Normal  Mood:  Euthymic  Affect:  Congruent       Thought Process:  Goal Directed and Descriptions of Associations: Circumstantial  Orientation:  Full (Time, Place, and Person)  Thought Content: Logical   Suicidal Thoughts:  No  Homicidal Thoughts:  No  Memory:  Immediate;   Fair Recent;   Fair Remote;   Good  Judgement:  Good  Insight:  Good  Psychomotor Activity:   walks slowly with a walker  Concentration:  Concentration: Good and Attention Span: Good   Recall:  Good  Fund of Knowledge: Good  Language: Good  Assets:  Desire for Improvement Financial Resources/Insurance Housing Resilience Social Support Transportation  ADL's:  Intact  Cognition: WNL  Prognosis:  Good   DIAGNOSES:    ICD-10-CM   1. Bipolar I disorder (HCC)  F31.9     2. Generalized anxiety disorder  F41.1     3. Insomnia due to other mental disorder  F51.05    F99     4. QT prolongation  R94.31     5. Polypharmacy  Z79.899     6. Chronic illness  R69      Receiving Psychotherapy: No   RECOMMENDATIONS:  PDMP reviewed. Last Xanax filled 10/24/2022. I provided 25 minutes of face to face time during this encounter, including time spent before and after the visit in  records review, medical decision making, counseling pertinent to today's visit, and charting.   We discussed the Trileptal.  She is doing very well as far as her mental health goes so I recommend decreasing a little further.  We can always go back up if either of them start to notice increased talkativeness, hyperactivity, decreased ability to sleep or any other manic symptoms.  Our goal is to get her at the lowest therapeutic dose possible, since she is on multiple medications and has multiple serious health problems.  She and her husband understand and agree with this plan.  Continue Buspar 5 mg, 1 po bid.  Continue Namenda 5 mg at bedtime. Decrease oxcarbazepine 300 mg to 1/2 pill p.o. every morning and 1 p.o. nightly. Continue  Effexor XR 75 mg, 1 po bid.  Continue NAC, multivitamin, B complex, fish oil, vitamin D 2000 IUs daily, ginkgo biloba. Return in 6 wks.       Avoid drugs that are known to cause prolonged QT interval  Melony Overly, PA-C

## 2023-02-23 ENCOUNTER — Ambulatory Visit: Payer: Medicare HMO | Attending: Cardiology

## 2023-02-23 DIAGNOSIS — Z95810 Presence of automatic (implantable) cardiac defibrillator: Secondary | ICD-10-CM

## 2023-02-23 DIAGNOSIS — I5032 Chronic diastolic (congestive) heart failure: Secondary | ICD-10-CM | POA: Diagnosis not present

## 2023-02-25 ENCOUNTER — Telehealth: Payer: Self-pay

## 2023-02-25 DIAGNOSIS — H2513 Age-related nuclear cataract, bilateral: Secondary | ICD-10-CM | POA: Diagnosis not present

## 2023-02-25 DIAGNOSIS — H353132 Nonexudative age-related macular degeneration, bilateral, intermediate dry stage: Secondary | ICD-10-CM | POA: Diagnosis not present

## 2023-02-25 DIAGNOSIS — H16223 Keratoconjunctivitis sicca, not specified as Sjogren's, bilateral: Secondary | ICD-10-CM | POA: Diagnosis not present

## 2023-02-25 DIAGNOSIS — H43393 Other vitreous opacities, bilateral: Secondary | ICD-10-CM | POA: Diagnosis not present

## 2023-02-25 NOTE — Progress Notes (Signed)
  Received: Today Camnitz, Andree Coss, MD  Shameek Nyquist, Josephine Igo, RN Can take lasix 40 mg BID for 4 days

## 2023-02-25 NOTE — Telephone Encounter (Signed)
Remote ICM transmission received.  Attempted call to [facility] regarding ICM remote transmission and left message with ICM phone number to return call.

## 2023-02-25 NOTE — Progress Notes (Signed)
Spoke with patient and heart failure questions reviewed.  Transmission results reviewed.  Pt asymptomatic for fluid accumulation.  Reports feeling well at this time and voices no complaints.   Weight is stable at 235 lbs.  She thinks she is limiting fluid intake to 64 oz day.  Encouraged her to review foods that she may be eating that are high in salt.    Advised Dr Elberta Fortis recommended she take Furosemide 40 mg twice a day x 4 days.  After 4th day, return to Furosemide 1 tablet daily.  She repeated instructions back correctly.  Will recheck fluid levels on 8/26 instead of 9/3.

## 2023-02-25 NOTE — Progress Notes (Signed)
EPIC Encounter for ICM Monitoring  Patient Name: Marie Ramos is a 67 y.o. female Date: 02/25/2023 Primary Care Physican: Daisy Floro, MD Primary Cardiologist: Lynnette Caffey Electrophysiologist: Elberta Fortis 09/02/2022 Weight: 248 lbs  09/08/2022 Weight: 248 lbs 09/29/2022 Weight: 248 lbs 10/21/2022 Weight: 248 lbs 12/31/2022 Office Weight: 245 lbs   Time in AT/AF  0.0 hr/day (0.0%)          Attempted call to patient and unable to reach.  Left message to return call.   Transmission reviewed.               Diet:  Tries to limit salt intake.         Optivol thoracic impedance suggesting possible fluid accumulation starting 7/2.     Prescribed:  Furosemide 40 mg Take 1 tablet (40 mg total) by mouth daily.      Jardiance 10 mg take 1 tablet by mouth daily.   Labs: 01/07/2023 Creatinine 1.83, BUN 52, Potassium 5.2, Sodium 141, GFR 30 A complete set of results can be found in Results Review.   Recommendations:  Unable to reach.     Follow-up plan: ICM clinic phone appointment on 03/10/2023 to recheck fluid levels.  91 day device clinic remote transmission 05/14/2023.      EP/Cardiology Office Visits:  04/03/2023 with Jari Favre, PA.   Recall 08/02/2023 with Francis Dowse, PA.     Copy of ICM check sent to Dr. Elberta Fortis.  Will send to Dr Lynnette Caffey for review if patient is reached.   3 month ICM trend: 02/23/2023.    12-14 Month ICM trend:     [name], RN 02/25/2023 9:07 AM

## 2023-02-26 DIAGNOSIS — I421 Obstructive hypertrophic cardiomyopathy: Secondary | ICD-10-CM | POA: Diagnosis not present

## 2023-02-26 DIAGNOSIS — I4901 Ventricular fibrillation: Secondary | ICD-10-CM | POA: Diagnosis not present

## 2023-02-26 DIAGNOSIS — I130 Hypertensive heart and chronic kidney disease with heart failure and stage 1 through stage 4 chronic kidney disease, or unspecified chronic kidney disease: Secondary | ICD-10-CM | POA: Diagnosis not present

## 2023-02-26 DIAGNOSIS — I5033 Acute on chronic diastolic (congestive) heart failure: Secondary | ICD-10-CM | POA: Diagnosis not present

## 2023-02-26 DIAGNOSIS — I69318 Other symptoms and signs involving cognitive functions following cerebral infarction: Secondary | ICD-10-CM | POA: Diagnosis not present

## 2023-02-26 DIAGNOSIS — N1832 Chronic kidney disease, stage 3b: Secondary | ICD-10-CM | POA: Diagnosis not present

## 2023-02-26 DIAGNOSIS — N179 Acute kidney failure, unspecified: Secondary | ICD-10-CM | POA: Diagnosis not present

## 2023-02-26 DIAGNOSIS — G931 Anoxic brain damage, not elsewhere classified: Secondary | ICD-10-CM | POA: Diagnosis not present

## 2023-02-26 DIAGNOSIS — I5022 Chronic systolic (congestive) heart failure: Secondary | ICD-10-CM | POA: Diagnosis not present

## 2023-02-28 DIAGNOSIS — N179 Acute kidney failure, unspecified: Secondary | ICD-10-CM | POA: Diagnosis not present

## 2023-02-28 DIAGNOSIS — I5033 Acute on chronic diastolic (congestive) heart failure: Secondary | ICD-10-CM | POA: Diagnosis not present

## 2023-02-28 DIAGNOSIS — I4901 Ventricular fibrillation: Secondary | ICD-10-CM | POA: Diagnosis not present

## 2023-02-28 DIAGNOSIS — I421 Obstructive hypertrophic cardiomyopathy: Secondary | ICD-10-CM | POA: Diagnosis not present

## 2023-02-28 DIAGNOSIS — I69318 Other symptoms and signs involving cognitive functions following cerebral infarction: Secondary | ICD-10-CM | POA: Diagnosis not present

## 2023-02-28 DIAGNOSIS — G931 Anoxic brain damage, not elsewhere classified: Secondary | ICD-10-CM | POA: Diagnosis not present

## 2023-02-28 DIAGNOSIS — N1832 Chronic kidney disease, stage 3b: Secondary | ICD-10-CM | POA: Diagnosis not present

## 2023-02-28 DIAGNOSIS — I130 Hypertensive heart and chronic kidney disease with heart failure and stage 1 through stage 4 chronic kidney disease, or unspecified chronic kidney disease: Secondary | ICD-10-CM | POA: Diagnosis not present

## 2023-02-28 DIAGNOSIS — I5022 Chronic systolic (congestive) heart failure: Secondary | ICD-10-CM | POA: Diagnosis not present

## 2023-03-02 ENCOUNTER — Ambulatory Visit: Payer: Medicare HMO | Attending: Cardiology

## 2023-03-02 DIAGNOSIS — Z95810 Presence of automatic (implantable) cardiac defibrillator: Secondary | ICD-10-CM

## 2023-03-02 DIAGNOSIS — I5032 Chronic diastolic (congestive) heart failure: Secondary | ICD-10-CM

## 2023-03-02 NOTE — Progress Notes (Unsigned)
EPIC Encounter for ICM Monitoring  Patient Name: Marie Ramos is a 67 y.o. female Date: 03/02/2023 Primary Care Physican: Daisy Floro, MD Primary Cardiologist: Lynnette Caffey Electrophysiologist: Elberta Fortis 03/02/2023 Weight: 234.5 lbs   Time in AT/AF  0.0 hr/day (0.0%)          Spoke with patient and heart failure questions reviewed.  Transmission results reviewed.  Pt asymptomatic for fluid accumulation.                Diet:  Tries to limit salt intake.         Optivol thoracic impedance suggesting fluid levels have improved after taking Furosemide 40 mg bid x 4 days as recommended by Dr Elberta Fortis but still suggesting fluid accumulation remains.     Prescribed:  Furosemide 40 mg Take 1 tablet (40 mg total) by mouth daily.      Jardiance 10 mg take 1 tablet by mouth daily.   Labs: 01/07/2023 Creatinine 1.83, BUN 52, Potassium 5.2, Sodium 141, GFR 30 A complete set of results can be found in Results Review.   Recommendations:  Confirmed she took Furosemide 40 mg bid x 4 days.    Follow-up plan: ICM clinic phone appointment on 03/10/2023 to recheck fluid levels.  91 day device clinic remote transmission 05/14/2023.      EP/Cardiology Office Visits:  04/03/2023 with Jari Favre, PA.   Recall 08/02/2023 with Francis Dowse, PA.     Copy of ICM check sent to Dr. Elberta Fortis.     3 month ICM trend: 03/02/2023.    12-14 Month ICM trend:     [name], RN 03/02/2023 4:02 PM

## 2023-03-04 DIAGNOSIS — G4733 Obstructive sleep apnea (adult) (pediatric): Secondary | ICD-10-CM | POA: Diagnosis not present

## 2023-03-05 DIAGNOSIS — I130 Hypertensive heart and chronic kidney disease with heart failure and stage 1 through stage 4 chronic kidney disease, or unspecified chronic kidney disease: Secondary | ICD-10-CM | POA: Diagnosis not present

## 2023-03-05 DIAGNOSIS — N179 Acute kidney failure, unspecified: Secondary | ICD-10-CM | POA: Diagnosis not present

## 2023-03-05 DIAGNOSIS — I421 Obstructive hypertrophic cardiomyopathy: Secondary | ICD-10-CM | POA: Diagnosis not present

## 2023-03-05 DIAGNOSIS — G931 Anoxic brain damage, not elsewhere classified: Secondary | ICD-10-CM | POA: Diagnosis not present

## 2023-03-05 DIAGNOSIS — I5033 Acute on chronic diastolic (congestive) heart failure: Secondary | ICD-10-CM | POA: Diagnosis not present

## 2023-03-05 DIAGNOSIS — I5022 Chronic systolic (congestive) heart failure: Secondary | ICD-10-CM | POA: Diagnosis not present

## 2023-03-05 DIAGNOSIS — I4901 Ventricular fibrillation: Secondary | ICD-10-CM | POA: Diagnosis not present

## 2023-03-05 DIAGNOSIS — N1832 Chronic kidney disease, stage 3b: Secondary | ICD-10-CM | POA: Diagnosis not present

## 2023-03-05 DIAGNOSIS — I69318 Other symptoms and signs involving cognitive functions following cerebral infarction: Secondary | ICD-10-CM | POA: Diagnosis not present

## 2023-03-11 DIAGNOSIS — R7303 Prediabetes: Secondary | ICD-10-CM | POA: Diagnosis not present

## 2023-03-11 DIAGNOSIS — E7800 Pure hypercholesterolemia, unspecified: Secondary | ICD-10-CM | POA: Diagnosis not present

## 2023-03-11 DIAGNOSIS — I10 Essential (primary) hypertension: Secondary | ICD-10-CM | POA: Diagnosis not present

## 2023-03-11 DIAGNOSIS — D75838 Other thrombocytosis: Secondary | ICD-10-CM | POA: Diagnosis not present

## 2023-03-11 DIAGNOSIS — Z6841 Body Mass Index (BMI) 40.0 and over, adult: Secondary | ICD-10-CM | POA: Diagnosis not present

## 2023-03-11 DIAGNOSIS — R7989 Other specified abnormal findings of blood chemistry: Secondary | ICD-10-CM | POA: Diagnosis not present

## 2023-03-11 DIAGNOSIS — Z0000 Encounter for general adult medical examination without abnormal findings: Secondary | ICD-10-CM | POA: Diagnosis not present

## 2023-03-11 DIAGNOSIS — N1831 Chronic kidney disease, stage 3a: Secondary | ICD-10-CM | POA: Diagnosis not present

## 2023-03-11 DIAGNOSIS — L409 Psoriasis, unspecified: Secondary | ICD-10-CM | POA: Diagnosis not present

## 2023-03-11 DIAGNOSIS — G4733 Obstructive sleep apnea (adult) (pediatric): Secondary | ICD-10-CM | POA: Diagnosis not present

## 2023-03-12 DIAGNOSIS — I10 Essential (primary) hypertension: Secondary | ICD-10-CM | POA: Diagnosis not present

## 2023-03-12 NOTE — Progress Notes (Signed)
No ICM remote transmission received for 03/10/2023 and next ICM transmission scheduled for 03/30/2023.

## 2023-03-13 ENCOUNTER — Ambulatory Visit (HOSPITAL_COMMUNITY)
Admission: RE | Admit: 2023-03-13 | Discharge: 2023-03-13 | Disposition: A | Discharge status: Home / Self Care | Payer: Medicare HMO | Source: Ambulatory Visit | Attending: Physician Assistant | Admitting: Physician Assistant

## 2023-03-13 DIAGNOSIS — R413 Other amnesia: Secondary | ICD-10-CM | POA: Diagnosis not present

## 2023-03-13 DIAGNOSIS — I6782 Cerebral ischemia: Secondary | ICD-10-CM | POA: Diagnosis not present

## 2023-03-15 ENCOUNTER — Other Ambulatory Visit: Payer: Self-pay | Admitting: Physician Assistant

## 2023-03-15 NOTE — Telephone Encounter (Signed)
Continue  Effexor XR 75 mg, 1 po bid.   Verify dose

## 2023-03-16 ENCOUNTER — Ambulatory Visit: Payer: Medicare HMO | Attending: Internal Medicine

## 2023-03-16 DIAGNOSIS — Z79899 Other long term (current) drug therapy: Secondary | ICD-10-CM | POA: Diagnosis not present

## 2023-03-16 DIAGNOSIS — E785 Hyperlipidemia, unspecified: Secondary | ICD-10-CM

## 2023-03-17 ENCOUNTER — Telehealth: Payer: Self-pay | Admitting: Internal Medicine

## 2023-03-17 DIAGNOSIS — E785 Hyperlipidemia, unspecified: Secondary | ICD-10-CM

## 2023-03-17 DIAGNOSIS — Z79899 Other long term (current) drug therapy: Secondary | ICD-10-CM

## 2023-03-17 LAB — LIPID PANEL
Chol/HDL Ratio: 3.3 ratio (ref 0.0–4.4)
Cholesterol, Total: 186 mg/dL (ref 100–199)
HDL: 56 mg/dL (ref >39)
LDL Chol Calc (NIH): 100 mg/dL — ABNORMAL HIGH (ref 0–99)
Triglycerides: 173 mg/dL — ABNORMAL HIGH (ref 0–149)
VLDL Cholesterol Cal: 30 mg/dL (ref 5–40)

## 2023-03-17 LAB — HEPATIC FUNCTION PANEL
ALT: 15 IU/L (ref 0–32)
AST: 13 IU/L (ref 0–40)
Albumin: 3.9 g/dL (ref 3.9–4.9)
Alkaline Phosphatase: 177 IU/L — ABNORMAL HIGH (ref 44–121)
Bilirubin Total: 0.2 mg/dL (ref 0.0–1.2)
Bilirubin, Direct: <0.1 mg/dL (ref 0.00–0.40)
Total Protein: 6.8 g/dL (ref 6.0–8.5)

## 2023-03-17 MED ORDER — ATORVASTATIN CALCIUM 40 MG PO TABS: 80.0000 mg | ORAL_TABLET | Freq: Every day | ORAL | Status: DC

## 2023-03-17 NOTE — Telephone Encounter (Signed)
Discussed lab results with patient's daughter Efraim Kaufmann (OK per DPR).  Per Dr. Lynnette Caffey: Increase atorvastatin to 80 mg and check a lipid panel and LFTs in 2 months.   Medication list updated, no refills needed at this time. Lipid panel and LFTs ordered, lab appt scheduled for 05/18/23. [name] verbalized understanding and expressed appreciation for call.

## 2023-03-30 ENCOUNTER — Ambulatory Visit: Payer: Medicare HMO | Attending: Cardiology

## 2023-03-30 ENCOUNTER — Encounter: Payer: Self-pay | Admitting: Physician Assistant

## 2023-03-30 ENCOUNTER — Ambulatory Visit (INDEPENDENT_AMBULATORY_CARE_PROVIDER_SITE_OTHER): Payer: Medicare HMO | Admitting: Physician Assistant

## 2023-03-30 DIAGNOSIS — Z95810 Presence of automatic (implantable) cardiac defibrillator: Secondary | ICD-10-CM

## 2023-03-30 DIAGNOSIS — I5032 Chronic diastolic (congestive) heart failure: Secondary | ICD-10-CM

## 2023-03-30 DIAGNOSIS — R413 Other amnesia: Secondary | ICD-10-CM

## 2023-03-30 NOTE — Progress Notes (Unsigned)
EPIC Encounter for ICM Monitoring  Patient Name: Marie Ramos is a 67 y.o. female Date: 03/30/2023 Primary Care Physican: Daisy Floro, MD Primary Cardiologist: Lynnette Caffey Electrophysiologist: Elberta Fortis 03/02/2023 Weight: 234.5 lbs 03/31/2023 Weight: 236.2 lbs   Time in AT/AF  0.0 hr/day (0.0%)          Spoke with daughter, [name] and heart failure questions reviewed.  Transmission results reviewed.  Pt asymptomatic for fluid accumulation.  Pt will have sleep study done and currently using O2 at night.  She had a recent brain scan and has multifactorial cognitive impairment.               Diet:  Daughter helps with diet when needed and encourages low salt.         Optivol thoracic impedance suggesting possible fluid accumulation starting 7/2.  Fluid index greater than normal threshold starting 7/17.   Prescribed:  Furosemide 40 mg Take 1 tablet (40 mg total) by mouth daily.      Jardiance 10 mg take 1 tablet by mouth daily.   Labs: 01/07/2023 Creatinine 1.83, BUN 52, Potassium 5.2, Sodium 141, GFR 30 A complete set of results can be found in Results Review.   Recommendations:  Discussed monitoring salt intake and encourage low salt diet.  Advised to call if patient develops fluid symptoms such as SOB, weight gain and lower leg swelling.  Copy sent to Jari Favre, [place] for review at 9/27 OV.     Follow-up plan: ICM clinic phone appointment on 04/20/2023 to recheck fluid levels.  91 day device clinic remote transmission 05/14/2023.      EP/Cardiology Office Visits:  04/03/2023 with Jari Favre, PA.   Recall 08/02/2023 with Francis Dowse, PA.     Copy of ICM check sent to Dr. Elberta Fortis.      3 month ICM trend: 03/30/2023.    12-14 Month ICM trend:     [name], RN 03/30/2023 4:01 PM

## 2023-03-30 NOTE — Progress Notes (Signed)
Assessment/Plan:   Memory impairment, likely multifactorial.   Marie Ramos is a very pleasant 67 y.o. RH female with a history of stage IIIa CKD, prior history of cardiac arrest with anoxic brain injury 2017 status post ICD, prior history of CVA 2017, history of DVT 2018, history of bipolar disorder followed by psychiatry, recent mechanical fall May 2024, recent acute on chronic diastolic CHF with acute hypoxic respiratory failure May 2024, anemia, seen today in follow up for memory concerns. Patient is currently on memantine 5 mg nightly, tolerating well.  Most recent MRI of the brain performed on 03/13/2023, personally reviewed is remarkable for moderate chronic microvascular changes without acute findings.  There is no significant volume loss or atrophy. She reports improvement of her memory issues after adjusting some of her psych meds and being on O2 prn.. She is also able to walk with a walker. Anemia is well monitored. Etiology is likely multifactorial.     Follow up in 6  months Continue Memantine 5mg  nightly. Side effects were discussed . Neuropsych testing for clarity of diagnosis is scheduled for 10/2023 Recommend good control of her cardiovascular risk factors Continue to control mood as per [facility] Continue iron supplements for anemia. Recommend using CPAP for OSA and O2 prn. She has a sleep study scheduled for Oct 2024    Subjective:    This patient is accompanied in the office by husband and her daughter who supplements the history.  Previous records as well as any outside records available were reviewed prior to todays visit. Patient was last seen on 11/2022 with [facility] 18/30     Any changes in memory since last visit? "Improved". LTM is better.  Patient has some difficulty remembering recent conversations and people names "but not like before". "I think Namenda helps"-husband says. repeats oneself?  Endorsed Disoriented when walking into a room?  Patient denies    Leaving  objects in unusual places?  May misplace things but not in unusual places   Wandering behavior?  denies   Any personality changes since last visit?  denies   Any worsening depression?:  Denies.  Mood is better controlled by Psych. Hallucinations or paranoia?  She has a history of bipolar disorder, manic depressive, with paranoia, followed by [facility]. However, this is well controlled at this time.  Seizures? denies    Any sleep changes?  Sleeping better.  Denies vivid dreams, REM behavior or sleepwalking   Sleep apnea?   Endorsed, she is now using her O2 and is doing better. She is set for Sleep study in October.  Any hygiene concerns? Denies.  Independent of bathing and dressing?  Endorsed  Does the patient needs help with medications? Husband  is in charge   Who is in charge of the finances? Husband  is in charge     Any changes in appetite?  Denies. She has lost some weight on purpose.      Patient have trouble swallowing? Denies.   Does the patient cook? No Any headaches?   denies   Chronic pain? She has chronic hand arthritis.  Ambulates with difficulty? She is at risk for falls but hs had PT with good results. She only uses wheelchair seldom, today using the walker.   Recent falls or head injuries? denies     Unilateral weakness, numbness or tingling? denies   Any tremors?  Denies   Any anosmia?  Denies   Any incontinence of urine?  Denies.   Any bowel dysfunction?  Denies      Patient lives with husband   Does the patient drive? Family does not recommend driving for now.   Initial visit 11/2022     How long did patient have memory difficulties?  "For the last 6-7 years, worse over the last 2 months ".  Daughter reports that the patient has difficulty recalling recent events and to a certain extent long-term "not knowing that she has a history of CKD and CHF when she was recently hospitalized".  "When she turned 60 we made a big party and she does not remember anything about it     Repeats oneself?  Endorsed.    Disoriented when walking into a room?  Patient denies   Leaving objects in unusual places? denies   Wandering behavior?  Patient was discharged today from [facility] but in the past, she was paranoid, packing her clothes, because she felt that they were taking pictures of her.  Any personality changes?  Patient denies  More paranoid than before Any history of depression?:  Yes "she is manic depressive".  Follows with psychiatry Hallucinations ?  Patient denies   Seizures?   Patient denies    Any sleep changes?  Denies vivid dreams, REM behavior or sleepwalking   Sleep apnea?  Possibly, for a sleep study soon. She did not like the CPAP in the hospital.  Any hygiene concerns? She needs assistance.  Independent of bathing and dressing? Needs assistance.  Does the patient needs help with medications? Husband  is in charge   Who is in charge of the finances? Husband is in charge     Any changes in appetite? Denies     Patient have trouble swallowing? denies   Does the patient cook? Yes, with supervision.     Any kitchen accidents such as leaving the stove on? denies   Any headaches?   denies   Chronic  pain ?  She has arthritis knees and hands . Had CTS in the past  Recent falls or head injuries?  She has a history of debility and frequent falls, unsteady gait  requiring PT and OT and during the last hospitalization in May 2024 for a mechanical fall she had CIR.  The falls were believed to be secondary to high-dose ox carbamazepine, as they improved with lower dose. Uses a walker for stability . She is going to have Home PT.  Vision changes? denies   Unilateral weakness, numbness or tingling? denies   Any tremors?   denies   Any anosmia?  denies   Any incontinence of urine?  "She cannot make it because of mobility, not due to incontinence " Any bowel dysfunction? denies      Patient lives on rehab, but she is being discharged today.  Unfortunately, the patient  needs placement at ALF but there are some insurance issues that he and her dad.-Is to have a discussion with social work at his [facility] to try to go ahead with the placement as she needs 24/7.  History of heavy alcohol intake? denies   History of heavy tobacco use? denies   Family history of dementia? Mother and MGF had AD  Does patient drive?No longer drives. 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   amLODipine (NORVASC) 5 MG tablet Take 1 tablet (5 mg total) by mouth daily with breakfast.   Ascorbic Acid (VITAMIN C PO) Take 1 capsule by mouth daily.   aspirin EC 81 MG tablet Take 1 tablet (81 mg total) by mouth daily. Swallow whole.   atorvastatin (LIPITOR) 40 MG tablet Take 2 tablets (80 mg total) by mouth daily.   busPIRone (BUSPAR) 5 MG tablet Take 1 tablet (5 mg total) by mouth 2 (two) times daily.   carvedilol (COREG) 12.5 MG tablet TAKE ONE TABLET BY MOUTH TWICE DAILY WITH BREAKFAST AND DINNER   cholecalciferol (VITAMIN D3) 25 MCG (1000 UNIT) tablet Take 2,000 Units by mouth daily.   empagliflozin (JARDIANCE) 10 MG TABS tablet Take 1 tablet (10 mg total) by mouth daily.   famotidine (PEPCID) 20 MG tablet Take 20 mg by mouth at bedtime.   fexofenadine (ALLEGRA) 180 MG tablet Take 180 mg by mouth daily.   fluticasone (FLONASE) 50 MCG/ACT nasal spray Place 1 spray into both nostrils daily. Use as directed   furosemide (LASIX) 40 MG tablet Take 1 tablet (40 mg total) by mouth daily.   Ginkgo Biloba 40 MG TABS Take 1 tablet (40 mg total) by mouth 2 (two) times daily.   losartan (COZAAR) 25 MG tablet Take 0.5 tablets (12.5 mg total) by mouth at bedtime.   memantine (NAMENDA) 5 MG tablet Take 1 tablet (5 mg total) by mouth at bedtime.   Multiple  Vitamin (MULTIVITAMIN WITH MINERALS) TABS tablet Take 1 tablet by mouth daily.   Multiple Vitamins-Minerals (OCUVITE PO) Take 1 capsule by mouth daily.   Oxcarbazepine (TRILEPTAL) 300 MG tablet 1/2 po q am, 1 po at bedtime.   polyethylene glycol (MIRALAX / GLYCOLAX) 17 g packet Take 17 g by mouth daily.   triamcinolone ointment (KENALOG) 0.5 % Apply 1 application topically as needed.   venlafaxine XR (EFFEXOR-XR) 75 MG 24 hr capsule Take 1 capsule (75 mg total) by mouth 2 (two) times daily.   No facility-administered encounter medications on file as of 03/30/2023.        No data to display            12/26/2022   10:00 AM  Montreal Cognitive Assessment   Visuospatial/ Executive (0/5) 2  Naming (0/3) 3  Attention: Read list of digits (0/2) 2  Attention: Read list of letters (0/1) 1  Attention: Serial 7 subtraction starting at 100 (0/3) 0  Language: Repeat phrase (0/2) 1  Language : Fluency (0/1) 0  Abstraction (0/2) 1  Delayed Recall (0/5) 2  Orientation (0/6) 6  Total 18  Adjusted Score (based on education) 18    Objective:     PHYSICAL EXAMINATION:    VITALS:  There were no vitals filed for this visit.  GEN:  The patient appears stated age and is in NAD. HEENT:  Normocephalic, atraumatic.   Neurological examination:  General: NAD, well-groomed, appears stated age. Orientation: The patient is alert. Oriented to person, place and date Cranial nerves: There is good facial symmetry.The speech is fluent and clear. No aphasia or dysarthria. Fund of knowledge is appropriate. Recent memory impaired, remote is improved.and remote memory are impaired. Attention and concentration are reduced.  Able to name objects and repeat phrases.  Hearing is intact to conversational tone.   Sensation: Sensation is intact to light touch throughout Motor: Strength is at least antigravity x4. DTR's 2/4 in UE/LE     Movement examination: Tone: There  is normal tone in the UE/LE Abnormal  movements:  no tremor.  No myoclonus.  No asterixis.   Coordination:  There is no decremation with RAM's. Normal finger to nose  Gait and Station: gait is normal, stride is normal, gait is broader based and cautious.   Thank you for allowing [name] the opportunity to participate in the care of this nice patient. Please do not hesitate to contact us for any questions or concerns.   Total time spent on today's visit was 30  minutes dedicated to this patient today, preparing to see patient, examining the patient, ordering tests and/or medications and counseling the patient, documenting clinical information in the EHR or other health record, independently interpreting results and communicating results to the patient/family, discussing treatment and goals, answering patient's questions and coordinating care.  Cc:  Daisy Floro, MD  Marlowe Kays 03/30/2023 10:34 AM

## 2023-03-30 NOTE — Patient Instructions (Signed)
It was a pleasure to see you today at our office.   Recommendations:  Neurocognitive evaluation at our office    Follow up after neurocognitive testing  Continue memantine Take 1 tablet (5 mg at night)    Counseling regarding caregiver distress, including caregiver depression, anxiety and issues regarding community resources, adult day care programs, adult living facilities, or memory care questions:  please contact your  Primary Doctor's Social Worker   Whom to call: Memory  decline, memory medications: Call our office [phone]   For psychiatric meds, mood meds: Please have your primary care physician manage these medications.  If you have any severe symptoms of a stroke, or other severe issues such as confusion,severe chills or fever, etc call 911 or go to the ER as you may need to be evaluated further    RECOMMENDATIONS FOR ALL PATIENTS WITH MEMORY PROBLEMS: 1. Continue to exercise (Recommend 30 minutes of walking everyday, or 3 hours every week) 2. Increase social interactions - continue going to [place] and enjoy social gatherings with friends and family 3. Eat healthy, avoid fried foods and eat more fruits and vegetables 4. Maintain adequate blood pressure, blood sugar, and blood cholesterol level. Reducing the risk of stroke and cardiovascular disease also helps promoting better memory. 5. Avoid stressful situations. Live a simple life and avoid aggravations. Organize your time and prepare for the next day in anticipation. 6. Sleep well, avoid any interruptions of sleep and avoid any distractions in the bedroom that may interfere with adequate sleep quality 7. Avoid sugar, avoid sweets as there is a strong link between excessive sugar intake, diabetes, and cognitive impairment We discussed the Mediterranean diet, which has been shown to help patients reduce the risk of progressive memory disorders and reduces cardiovascular risk. This includes eating fish, eat fruits and green  leafy vegetables, nuts like almonds and hazelnuts, walnuts, and also use olive oil. Avoid fast foods and fried foods as much as possible. Avoid sweets and sugar as sugar use has been linked to worsening of memory function.  There is always a concern of gradual progression of memory problems. If this is the case, then we may need to adjust level of care according to patient needs. Support, both to the patient and caregiver, should then be put into place.      You have been referred for a neuropsychological evaluation (i.e., evaluation of memory and thinking abilities). Please bring someone with you to this appointment if possible, as it is helpful for the doctor to hear from both you and another adult who knows you well. Please bring eyeglasses and hearing aids if you wear them.    The evaluation will take approximately 3 hours and has two parts:   The first part is a clinical interview with the neuropsychologist (Dr. Milbert Coulter or Dr. Roseanne Reno). During the interview, the neuropsychologist will speak with you and the individual you brought to the appointment.    The second part of the evaluation is testing with the doctor's technician Annabelle Harman or Selena Batten). During the testing, the technician will ask you to remember different types of material, solve problems, and answer some questionnaires. Your family member will not be present for this portion of the evaluation.   Please note: We must reserve several hours of the neuropsychologist's time and the psychometrician's time for your evaluation appointment. As such, there is a No-Show fee of $100. If you are unable to attend any of your appointments, please contact our office as soon as possible  to reschedule.    FALL PRECAUTIONS: Be cautious when walking. Scan the area for obstacles that may increase the risk of trips and falls. When getting up in the mornings, sit up at the edge of the bed for a few minutes before getting out of bed. Consider elevating the bed at  the head end to avoid drop of blood pressure when getting up. Walk always in a well-lit room (use night lights in the walls). Avoid area rugs or power cords from appliances in the middle of the walkways. Use a walker or a cane if necessary and consider physical therapy for balance exercise. Get your eyesight checked regularly.  FINANCIAL OVERSIGHT: Supervision, especially oversight when making financial decisions or transactions is also recommended.  HOME SAFETY: Consider the safety of the kitchen when operating appliances like stoves, microwave oven, and blender. Consider having supervision and share cooking responsibilities until no longer able to participate in those. Accidents with firearms and other hazards in the house should be identified and addressed as well.   ABILITY TO BE LEFT ALONE: If patient is unable to contact 911 operator, consider using LifeLine, or when the need is there, arrange for someone to stay with patients. Smoking is a fire hazard, consider supervision or cessation. Risk of wandering should be assessed by caregiver and if detected at any point, supervision and safe proof recommendations should be instituted.  MEDICATION SUPERVISION: Inability to self-administer medication needs to be constantly addressed. Implement a mechanism to ensure safe administration of the medications.       Mediterranean Diet A Mediterranean diet refers to food and lifestyle choices that are based on the traditions of countries located on the [place]. This way of eating has been shown to help prevent certain conditions and improve outcomes for people who have chronic diseases, like kidney disease and heart disease. What are tips for following this plan? Lifestyle  Cook and eat meals together with your family, when possible. Drink enough fluid to keep your urine clear or pale yellow. Be physically active every day. This includes: Aerobic exercise like running or swimming. Leisure  activities like gardening, walking, or housework. Get 7-8 hours of sleep each night. If recommended by your health care provider, drink red wine in moderation. This means 1 glass a day for nonpregnant women and 2 glasses a day for men. A glass of wine equals 5 oz (150 mL). Reading food labels  Check the serving size of packaged foods. For foods such as rice and pasta, the serving size refers to the amount of cooked product, not dry. Check the total fat in packaged foods. Avoid foods that have saturated fat or trans fats. Check the ingredients list for added sugars, such as corn syrup. Shopping  At the grocery store, buy most of your food from the areas near the walls of the store. This includes: Fresh fruits and vegetables (produce). Grains, beans, nuts, and seeds. Some of these may be available in unpackaged forms or large amounts (in bulk). Fresh seafood. Poultry and eggs. Low-fat dairy products. Buy whole ingredients instead of prepackaged foods. Buy fresh fruits and vegetables in-season from local farmers markets. Buy frozen fruits and vegetables in resealable bags. If you do not have access to quality fresh seafood, buy precooked frozen shrimp or canned fish, such as tuna, salmon, or sardines. Buy small amounts of raw or cooked vegetables, salads, or olives from the deli or salad bar at your store. Stock your pantry so you always have certain foods  on hand, such as olive oil, canned tuna, canned tomatoes, rice, pasta, and beans. Cooking  Cook foods with extra-virgin olive oil instead of using butter or other vegetable oils. Have meat as a side dish, and have vegetables or grains as your main dish. This means having meat in small portions or adding small amounts of meat to foods like pasta or stew. Use beans or vegetables instead of meat in common dishes like chili or lasagna. Experiment with different cooking methods. Try roasting or broiling vegetables instead of steaming or sauteing  them. Add frozen vegetables to soups, stews, pasta, or rice. Add nuts or seeds for added healthy fat at each meal. You can add these to yogurt, salads, or vegetable dishes. Marinate fish or vegetables using olive oil, lemon juice, garlic, and fresh herbs. Meal planning  Plan to eat 1 vegetarian meal one day each week. Try to work up to 2 vegetarian meals, if possible. Eat seafood 2 or more times a week. Have healthy snacks readily available, such as: Vegetable sticks with hummus. Greek yogurt. Fruit and nut trail mix. Eat balanced meals throughout the week. This includes: Fruit: 2-3 servings a day Vegetables: 4-5 servings a day Low-fat dairy: 2 servings a day Fish, poultry, or lean meat: 1 serving a day Beans and legumes: 2 or more servings a week Nuts and seeds: 1-2 servings a day Whole grains: 6-8 servings a day Extra-virgin olive oil: 3-4 servings a day Limit red meat and sweets to only a few servings a month What are my food choices? Mediterranean diet Recommended Grains: Whole-grain pasta. Brown rice. Bulgar wheat. Polenta. Couscous. Whole-wheat bread. Orpah Cobb. Vegetables: Artichokes. Beets. Broccoli. Cabbage. Carrots. Eggplant. Green beans. Chard. Kale. Spinach. Onions. Leeks. Peas. Squash. Tomatoes. Peppers. Radishes. Fruits: Apples. Apricots. Avocado. Berries. Bananas. Cherries. Dates. Figs. Grapes. Lemons. Melon. Oranges. Peaches. Plums. Pomegranate. Meats and other protein foods: Beans. Almonds. Sunflower seeds. Pine nuts. Peanuts. Cod. Salmon. Scallops. Shrimp. Tuna. Tilapia. Clams. Oysters. Eggs. Dairy: Low-fat milk. Cheese. Greek yogurt. Beverages: Water. Red wine. Herbal tea. Fats and oils: Extra virgin olive oil. Avocado oil. Grape seed oil. Sweets and desserts: [place] yogurt with honey. Baked apples. Poached pears. Trail mix. Seasoning and other foods: Basil. Cilantro. Coriander. Cumin. Mint. Parsley. Sage. Rosemary. Tarragon. Garlic. Oregano. Thyme. Pepper.  Balsalmic vinegar. Tahini. Hummus. Tomato sauce. Olives. Mushrooms. Limit these Grains: Prepackaged pasta or rice dishes. Prepackaged cereal with added sugar. Vegetables: Deep fried potatoes (french fries). Fruits: Fruit canned in syrup. Meats and other protein foods: Beef. Pork. Lamb. Poultry with skin. Hot dogs. Tomasa Blase. Dairy: Ice cream. Sour cream. Whole milk. Beverages: Juice. Sugar-sweetened soft drinks. Beer. Liquor and spirits. Fats and oils: Butter. Canola oil. Vegetable oil. Beef fat (tallow). Lard. Sweets and desserts: Cookies. Cakes. Pies. Candy. Seasoning and other foods: Mayonnaise. Premade sauces and marinades. The items listed may not be a complete list. Talk with your dietitian about what dietary choices are right for you. Summary The Mediterranean diet includes both food and lifestyle choices. Eat a variety of fresh fruits and vegetables, beans, nuts, seeds, and whole grains. Limit the amount of red meat and sweets that you eat. Talk with your health care provider about whether it is safe for you to drink red wine in moderation. This means 1 glass a day for nonpregnant women and 2 glasses a day for men. A glass of wine equals 5 oz (150 mL). This information is not intended to replace advice given to you by your health care provider. Make  sure you discuss any questions you have with your health care provider. Document Released: 02/14/2016 Document Revised: 03/18/2016 Document Reviewed: 02/14/2016 Elsevier Interactive Patient Education  2017 [employer].      MRI at [place] [facility]

## 2023-03-31 NOTE — Progress Notes (Signed)
Remote ICD transmission.   

## 2023-04-02 ENCOUNTER — Ambulatory Visit: Payer: Medicare HMO | Admitting: Physician Assistant

## 2023-04-02 ENCOUNTER — Encounter: Payer: Self-pay | Admitting: Physician Assistant

## 2023-04-02 DIAGNOSIS — Z79899 Other long term (current) drug therapy: Secondary | ICD-10-CM | POA: Diagnosis not present

## 2023-04-02 DIAGNOSIS — F5105 Insomnia due to other mental disorder: Secondary | ICD-10-CM | POA: Diagnosis not present

## 2023-04-02 DIAGNOSIS — F99 Mental disorder, not otherwise specified: Secondary | ICD-10-CM | POA: Diagnosis not present

## 2023-04-02 DIAGNOSIS — F411 Generalized anxiety disorder: Secondary | ICD-10-CM | POA: Diagnosis not present

## 2023-04-02 DIAGNOSIS — R9431 Abnormal electrocardiogram [ECG] [EKG]: Secondary | ICD-10-CM | POA: Diagnosis not present

## 2023-04-02 DIAGNOSIS — F319 Bipolar disorder, unspecified: Secondary | ICD-10-CM | POA: Diagnosis not present

## 2023-04-02 DIAGNOSIS — R69 Illness, unspecified: Secondary | ICD-10-CM | POA: Diagnosis not present

## 2023-04-02 MED ORDER — VENLAFAXINE HCL ER 75 MG PO CP24: 75.0000 mg | ORAL_CAPSULE | Freq: Two times a day (BID) | ORAL | 11 refills | Status: DC

## 2023-04-02 NOTE — Progress Notes (Signed)
[facility]  Patient ID: Marie Ramos,  MRN: [id number]  PCP: Daisy Floro, MD  Date of Evaluation: 04/02/2023 Time spent:30 minutes  Chief Complaint:  Chief Complaint   Follow-up    HISTORY/CURRENT STATUS: For medication follow-up.  Husband, Marie Ramos, is with her.   Marie Ramos states she is doing pretty good.  She has even started cooking some.  Her husband does most of the it though.  She is getting stronger after her hospitalization earlier this year.  She is using her walker most all the time, especially when she is out.  She can walk without it while in her own home but has it close by if she needs to grab hold of it.  Marie Ramos says he would like to see her doing things more like she used to.  Like they will talk about going out to eat but then she just wants him to go and bring something back to her.  She does not like to go out at all.  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  She states she is not depressed.  She just feels more comfortable at home.  She has also been diagnosed with anemia so she is tired a lot.  Will be starting iron in the next few days.  Appetite is normal.  She has lost a couple of pounds since our last visit, she is trying hard to do that.  ADLs and personal hygiene are normal.  Not crying easily.  No feelings of hopelessness or worthlessness.  Sleeps pretty good.  Anxiety is not much of a problem.  The BuSpar does help.  Denies suicidal or homicidal thoughts.  Patient denies increased energy with decreased need for sleep, increased talkativeness, racing thoughts, impulsivity or risky behaviors, increased spending, increased libido, grandiosity, increased irritability or anger, paranoia, or hallucinations.  Review of Systems  Constitutional:  Positive for malaise/fatigue.  HENT: Negative.    Eyes: Negative.   Respiratory: Negative.    Cardiovascular: Negative.   Gastrointestinal: Negative.   Genitourinary: Negative.    Musculoskeletal: Negative.   Skin: Negative.   Neurological: Negative.   Endo/Heme/Allergies: Negative.   Psychiatric/Behavioral:         See HPI   Individual Medical History/ Review of Systems: Changes? :Yes  saw Durenda Age, PA-C, she's referred her to a neuropsychologist for memory.   Past medications for mental health diagnoses include: Xanax, lithium caused diarrhea and increased thirst, Trileptal, Effexor XR, Seroquel worked well, Abilify unknown what happened, Adderall, Risperdal helped mania but caused bilateral lower extremity edema and dry mouth.  Allergies: Sulfa antibiotics, Albuterol, Other, Sulfamethoxazole-trimethoprim, Vicodin hp [hydrocodone-acetaminophen], Naproxen, and Vicodin [hydrocodone-acetaminophen]  Current Medications:  Current Outpatient Medications:    Acetylcysteine (NAC) 600 MG CAPS, Take 1 capsule (600 mg total) by mouth daily., Disp: 30 capsule, Rfl: 11   amLODipine (NORVASC) 5 MG tablet, Take 1 tablet (5 mg total) by mouth daily with breakfast., Disp: 30 tablet, Rfl: 0   Ascorbic Acid (VITAMIN C PO), Take 1 capsule by mouth daily., Disp: , Rfl:    aspirin EC 81 MG tablet, Take 1 tablet (81 mg total) by mouth daily. Swallow whole., Disp: 90 tablet, Rfl: 3   atorvastatin (LIPITOR) 40 MG tablet, Take 2 tablets (80 mg total) by mouth daily., Disp: , Rfl:    busPIRone (BUSPAR) 5 MG tablet, Take 1 tablet (5 mg total) by mouth 2 (two) times daily., Disp: 180 tablet, Rfl: 1  carvedilol (COREG) 12.5 MG tablet, TAKE ONE TABLET BY MOUTH TWICE DAILY WITH BREAKFAST AND DINNER, Disp: 180 tablet, Rfl: 2   cholecalciferol (VITAMIN D3) 25 MCG (1000 UNIT) tablet, Take 2,000 Units by mouth daily., Disp: , Rfl:    empagliflozin (JARDIANCE) 10 MG TABS tablet, Take 1 tablet (10 mg total) by mouth daily., Disp: 30 tablet, Rfl: 11   famotidine (PEPCID) 20 MG tablet, Take 20 mg by mouth at bedtime., Disp: , Rfl:    ferrous sulfate 325 (65 FE) MG EC tablet, Take 325 mg by mouth  3 (three) times daily with meals., Disp: , Rfl:    fexofenadine (ALLEGRA) 180 MG tablet, Take 180 mg by mouth daily., Disp: , Rfl:    fluticasone (FLONASE) 50 MCG/ACT nasal spray, Place 1 spray into both nostrils daily. Use as directed, Disp: , Rfl:    furosemide (LASIX) 40 MG tablet, Take 1 tablet (40 mg total) by mouth daily., Disp: 90 tablet, Rfl: 3   Ginkgo Biloba 40 MG TABS, Take 1 tablet (40 mg total) by mouth 2 (two) times daily., Disp: 180 tablet, Rfl: 11   losartan (COZAAR) 25 MG tablet, Take 0.5 tablets (12.5 mg total) by mouth at bedtime., Disp: 45 tablet, Rfl: 3   memantine (NAMENDA) 5 MG tablet, Take 1 tablet (5 mg total) by mouth at bedtime., Disp: 90 tablet, Rfl: 1   Multiple Vitamin (MULTIVITAMIN WITH MINERALS) TABS tablet, Take 1 tablet by mouth daily., Disp: , Rfl:    Multiple Vitamins-Minerals (OCUVITE PO), Take 1 capsule by mouth daily., Disp: , Rfl:    Oxcarbazepine (TRILEPTAL) 300 MG tablet, 1/2 po q am, 1 po at bedtime., Disp: , Rfl:    polyethylene glycol (MIRALAX / GLYCOLAX) 17 g packet, Take 17 g by mouth daily., Disp: 14 each, Rfl: 0   triamcinolone ointment (KENALOG) 0.5 %, Apply 1 application topically as needed., Disp: , Rfl: 0   venlafaxine XR (EFFEXOR-XR) 75 MG 24 hr capsule, Take 1 capsule (75 mg total) by mouth 2 (two) times daily., Disp: 60 capsule, Rfl: 11 Medication Side Effects: none  Family Medical/ Social History: Changes?  no  MENTAL HEALTH EXAM:  There were no vitals taken for this visit.There is no height or weight on file to calculate BMI.  General Appearance: Casual, Well Groomed, and Obese  Eye Contact:  Good  Speech:  Clear and Coherent and Normal Rate  Volume:  Normal  Mood:  Euthymic  Affect:  Congruent       Thought Process:  Goal Directed and Descriptions of Associations: Circumstantial  Orientation:  Full (Time, Place, and Person)  Thought Content: Logical   Suicidal Thoughts:  No  Homicidal Thoughts:  No  Memory:  Immediate;    Fair Recent;   Fair Remote;   Good  Judgement:  Good  Insight:  Good  Psychomotor Activity:   walks slowly with a walker  Concentration:  Concentration: Good and Attention Span: Good  Recall:  Good  Fund of Knowledge: Good  Language: Good  Assets:  Desire for Improvement Financial Resources/Insurance Housing Resilience Social Support Transportation  ADL's:  Intact  Cognition: WNL  Prognosis:  Good   Notes from neurology since the last visit were reviewed. Awaiting neuropsychological testing.  DIAGNOSES:    ICD-10-CM   1. Bipolar I disorder (HCC)  F31.9     2. Generalized anxiety disorder  F41.1     3. Insomnia due to other mental disorder  F51.05    F99  4. Chronic illness  R69     5. Polypharmacy  Z79.899     6. QT prolongation  R94.31      Receiving Psychotherapy: No   RECOMMENDATIONS:  PDMP reviewed. Last Xanax filled 10/24/2022. I provided 30 minutes of face to face time during this encounter, including time spent before and after the visit in records review, medical decision making, counseling pertinent to today's visit, and charting.   She is doing well overall, especially what she has been through with her physical issues this past 6 months or so.  No medication changes are necessary.  We have been decreasing the Trileptal, thinking that may be a part of the reason she is falling so much.  She is not having any falls at all now and she feels well mentally so the 3 of [name] agree to leave the Trileptal at the same.  Continue Buspar 5 mg, 1 po bid.  Continue Namenda 5 mg at bedtime, per neurology. Continue oxcarbazepine 300 mg to 1/2 pill p.o. every morning and 1 p.o. nightly. Continue  Effexor XR 75 mg, 1 po bid.  Continue vitamins as per med list.   Return in 2 to 3 months.     Avoid drugs that are known to cause prolonged QT interval  Melony Overly, PA-C

## 2023-04-02 NOTE — Progress Notes (Deleted)
Office Visit    Patient Name: Marie Ramos Date of Encounter: 04/02/2023  PCP:  Daisy Floro, MD   [facility]  Cardiologist:  Orbie Pyo, MD  Advanced Practice Provider:  No care team member to display Electrophysiologist:  None    HPI    Marie Ramos is a 67 y.o. female with a past medical history of chronic diastolic congestive heart failure, stage IIIa chronic kidney disease, cardiac arrest, bipolar disorder, essential hypertension, CVA, and BMI 45-49 presents today for follow-up appointment.  Sometimes when she walks to the car she is SOB. She has a big bruise on her left upper arm from a fall. She has considered using a cane before for balance and I encouraged this if she is leaving the house.  No issues with her medication. Encouraged a low sodium diet. She does suffer from bipolar disorder but it has been controlled on medications.   Last seen by Dr. Lynnette Caffey 12/31/2022 and losartan was started 12.5 mg daily.   Today, she***  Past Medical History    Past Medical History:  Diagnosis Date   Abnormality of gait    Acute bilat watershed infarction [facility])    Acute lower UTI    Acute metabolic encephalopathy 08/16/2019   Acute respiratory failure (HCC)    Acute systolic congestive heart failure (HCC)    AKI (acute kidney injury) (HCC)    Anoxic brain damage (HCC)    Bipolar 1 disorder (HCC)    Bipolar affective disorder in remission (HCC)    Cardiac arrest (HCC) 08/24/2015   Cerebral infarction due to embolism of cerebral artery (HCC)    Cerebrovascular accident (CVA) due to embolism of cerebral artery (HCC) 12/05/2015   Chronically dry eyes    Closed fracture of left distal radius 12/01/2018   DVT (deep venous thrombosis) (HCC) 12/05/2015   Dyspnea    r/t seasonal allergies   Hypertrophic obstructive cardiomyopathy (HCC) 08/29/2015   Leukocytosis    Macular degeneration    Pneumonia due to COVID-19 virus 08/16/2019   Pulmonary  vascular congestion 08/16/2019   QT prolongation 08/29/2015   Stroke (HCC)    Systolic dysfunction with acute on chronic heart failure (HCC)    TBI (traumatic brain injury) (HCC) 2011   Inpatient rehab [facility]   Thrombocytosis    Ventricular fibrillation (HCC) 09/03/15   MDT ICD Dr. Johney Frame   Past Surgical History:  Procedure Laterality Date   ABDOMINAL HYSTERECTOMY     CARDIAC CATHETERIZATION N/A 08/24/2015   Procedure: Left Heart Cath and Coronary Angiography;  Surgeon: Tonny Bollman, MD;  Location: [facility] INVASIVE CV LAB;  Service: Cardiovascular;  Laterality: N/A;   EP IMPLANTABLE DEVICE N/A 09/03/2015   MDT dual chamber ICD   OPEN REDUCTION INTERNAL FIXATION (ORIF) DISTAL RADIAL FRACTURE Left 12/01/2018   Procedure: OPEN REDUCTION INTERNAL FIXATION (ORIF) LEFT DISTAL RADIAL FRACTURE;  Surgeon: Bjorn Pippin, MD;  Location: [facility]S;  Service: Orthopedics;  Laterality: Left;   SPLENECTOMY, TOTAL  2011   TONSILLECTOMY      Allergies  Allergies  Allergen Reactions   Sulfa Antibiotics Itching    Face neck red   Albuterol Itching   Other Hives   Sulfamethoxazole-Trimethoprim Hives   Vicodin Hp [Hydrocodone-Acetaminophen] Hives   Naproxen Itching   Vicodin [Hydrocodone-Acetaminophen] Itching     EKGs/Labs/Other Studies Reviewed:   The following studies were reviewed today:  Prior Studies:   2022 carotid ultrasound with mild disease bilaterally  2022 TTE ejection fraction of 65 to 70% with left ventricular hypertrophy of basal septum and no significant valvular abnormalities   2017 coronary angiography with no obstructive disease   2017 cardiac MRI with no late gadolinium enhancement with ejection fraction of 45% and moderate concentric hypertrophy  EKG:  EKG ordered today and went to Dr. Elberta Fortis  Recent Labs: 07/30/2022: TSH 2.420 11/24/2022: B Natriuretic Peptide 120.2 11/26/2022: Magnesium 2.4 11/27/2022: Hemoglobin 10.6; Platelets 439 01/07/2023: BUN 52; Creatinine, Ser 1.83;  Potassium 5.2; Sodium 141 03/16/2023: ALT 15  Recent Lipid Panel    Component Value Date/Time   CHOL 186 03/16/2023 0717   TRIG 173 (H) 03/16/2023 0717   HDL 56 03/16/2023 0717   CHOLHDL 3.3 03/16/2023 0717   CHOLHDL 5.2 09/03/2015 0248   VLDL 67 (H) 09/03/2015 0248   LDLCALC 100 (H) 03/16/2023 0717    Home Medications   No outpatient medications have been marked as taking for the 04/03/23 encounter (Appointment) with Sharlene Dory, PA-C.     Review of Systems      All other systems reviewed and are otherwise negative except as noted above.  Physical Exam    VS:  There were no vitals taken for this visit. , BMI There is no height or weight on file to calculate BMI.  Wt Readings from Last 3 Encounters:  12/31/22 245 lb (111.1 kg)  12/26/22 246 lb (111.6 kg)  12/23/22 251 lb 12.8 oz (114.2 kg)     GEN: Well nourished, well developed, in no acute distress. HEENT: normal. Neck: Supple, no JVD, carotid bruits, or masses. Cardiac: RRR, no murmurs, rubs, or gallops. No clubbing, cyanosis, edema.  Radials/PT 2+ and equal bilaterally.  Respiratory:  Respirations regular and unlabored, clear to auscultation bilaterally. GI: Soft, nontender, nondistended. MS: No deformity or atrophy. Skin: Warm and dry, no rash. Neuro:  Strength and sensation are intact. Psych: Normal affect.  Assessment & Plan    Chronic diastolic congestive heart failure -no swelling in her legs and does have some SOB with ambulation -will update an echo -she has not been driving yet  Cardiac arrest s/p ICD -She has an appointment with Dr. Elberta Fortis later today -No atrial fibrillation on interrogation  Essential hypertension -Continue current medications including amlodipine 5 mg daily, aspirin 81 mg daily, Lipitor 20 mg daily, Coreg 12.5 mg twice daily, Lasix 40 mg daily, Cozaar 100 mg daily   BMI 45-49 -encouraged ambulation 30 minutes a day x 5 days a week  No BP recorded.  {Refresh Note OR Click  here to enter BP  :1}***      Disposition: Follow up 3 months with Orbie Pyo, MD or APP.  Signed, Sharlene Dory, PA-C 04/02/2023, 9:46 PM [facility]

## 2023-04-03 ENCOUNTER — Ambulatory Visit: Payer: Medicare HMO | Admitting: Physician Assistant

## 2023-04-03 DIAGNOSIS — I10 Essential (primary) hypertension: Secondary | ICD-10-CM

## 2023-04-03 DIAGNOSIS — Z95810 Presence of automatic (implantable) cardiac defibrillator: Secondary | ICD-10-CM

## 2023-04-03 DIAGNOSIS — I5032 Chronic diastolic (congestive) heart failure: Secondary | ICD-10-CM

## 2023-04-03 DIAGNOSIS — I469 Cardiac arrest, cause unspecified: Secondary | ICD-10-CM

## 2023-04-05 NOTE — Progress Notes (Unsigned)
Office Visit    Patient Name: Marie Ramos Date of Encounter: 04/06/2023  PCP:  Daisy Floro, MD   [facility]  Cardiologist:  Orbie Pyo, MD  Advanced Practice Provider:  No care team member to display Electrophysiologist:  None    HPI    Marie Ramos is a 67 y.o. female with a past medical history of chronic diastolic congestive heart failure, stage IIIa chronic kidney disease, cardiac arrest, bipolar disorder, essential hypertension, CVA, and BMI 45-49 presents today for follow-up appointment.  Sometimes when she walks to the car she is SOB. She has a big bruise on her left upper arm from a fall. She has considered using a cane before for balance and I encouraged this if she is leaving the house.  No issues with her medication. Encouraged a low sodium diet. She does suffer from bipolar disorder but it has been controlled on medications.   Last seen by Dr. Lynnette Caffey 12/31/2022 and losartan was started 12.5 mg daily.   Today, she tells me that she has been tolerating her losartan well.  Much easier now that they got a pill cutter.  Blood pressure well-controlled today.  She has not noticed any lower extremity swelling.  No new shortness of breath.  No chest pain.  We plan to check her kidneys and electrolytes today.  Otherwise, doing well from a CV standpoint.  Most recent ICD check reviewed with the patient.  Does not appear volume overloaded today.  She continues a low-sodium diet.  Reports no shortness of breath nor dyspnea on exertion. Reports no chest pain, pressure, or tightness. No edema, orthopnea, PND. Reports no palpitations.   Past Medical History    Past Medical History:  Diagnosis Date   Abnormality of gait    Acute bilat watershed infarction [facility])    Acute lower UTI    Acute metabolic encephalopathy 08/16/2019   Acute respiratory failure (HCC)    Acute systolic congestive heart failure (HCC)    AKI (acute kidney injury)  (HCC)    Anoxic brain damage (HCC)    Bipolar 1 disorder (HCC)    Bipolar affective disorder in remission (HCC)    Cardiac arrest (HCC) 08/24/2015   Cerebral infarction due to embolism of cerebral artery (HCC)    Cerebrovascular accident (CVA) due to embolism of cerebral artery (HCC) 12/05/2015   Chronically dry eyes    Closed fracture of left distal radius 12/01/2018   DVT (deep venous thrombosis) (HCC) 12/05/2015   Dyspnea    r/t seasonal allergies   Hypertrophic obstructive cardiomyopathy (HCC) 08/29/2015   Leukocytosis    Macular degeneration    Pneumonia due to COVID-19 virus 08/16/2019   Pulmonary vascular congestion 08/16/2019   QT prolongation 08/29/2015   Stroke (HCC)    Systolic dysfunction with acute on chronic heart failure (HCC)    TBI (traumatic brain injury) (HCC) 2011   Inpatient rehab [facility]   Thrombocytosis    Ventricular fibrillation (HCC) 09/03/15   MDT ICD Dr. Johney Frame   Past Surgical History:  Procedure Laterality Date   ABDOMINAL HYSTERECTOMY     CARDIAC CATHETERIZATION N/A 08/24/2015   Procedure: Left Heart Cath and Coronary Angiography;  Surgeon: Tonny Bollman, MD;  Location: [facility] INVASIVE CV LAB;  Service: Cardiovascular;  Laterality: N/A;   EP IMPLANTABLE DEVICE N/A 09/03/2015   MDT dual chamber ICD   OPEN REDUCTION INTERNAL FIXATION (ORIF) DISTAL RADIAL FRACTURE Left 12/01/2018   Procedure: OPEN REDUCTION INTERNAL  FIXATION (ORIF) LEFT DISTAL RADIAL FRACTURE;  Surgeon: Bjorn Pippin, MD;  Location: [facility];  Service: Orthopedics;  Laterality: Left;   SPLENECTOMY, TOTAL  2011   TONSILLECTOMY      Allergies  Allergies  Allergen Reactions   Sulfa Antibiotics Itching    Face neck red   Albuterol Itching   Other Hives   Sulfamethoxazole-Trimethoprim Hives   Vicodin Hp [Hydrocodone-Acetaminophen] Hives   Naproxen Itching   Vicodin [Hydrocodone-Acetaminophen] Itching     EKGs/Labs/Other Studies Reviewed:   The following studies were reviewed today:  Prior  Studies:   2022 carotid ultrasound with mild disease bilaterally   2022 TTE ejection fraction of 65 to 70% with left ventricular hypertrophy of basal septum and no significant valvular abnormalities   2017 coronary angiography with no obstructive disease   2017 cardiac MRI with no late gadolinium enhancement with ejection fraction of 45% and moderate concentric hypertrophy  EKG:  EKG ordered today and went to Dr. Elberta Fortis  Recent Labs: 07/30/2022: TSH 2.420 11/24/2022: B Natriuretic Peptide 120.2 11/26/2022: Magnesium 2.4 11/27/2022: Hemoglobin 10.6; Platelets 439 01/07/2023: BUN 52; Creatinine, Ser 1.83; Potassium 5.2; Sodium 141 03/16/2023: ALT 15  Recent Lipid Panel    Component Value Date/Time   CHOL 186 03/16/2023 0717   TRIG 173 (H) 03/16/2023 0717   HDL 56 03/16/2023 0717   CHOLHDL 3.3 03/16/2023 0717   CHOLHDL 5.2 09/03/2015 0248   VLDL 67 (H) 09/03/2015 0248   LDLCALC 100 (H) 03/16/2023 0717    Home Medications   Current Meds  Medication Sig   Acetylcysteine (NAC) 600 MG CAPS Take 1 capsule (600 mg total) by mouth daily.   amLODipine (NORVASC) 5 MG tablet Take 1 tablet (5 mg total) by mouth daily with breakfast.   Ascorbic Acid (VITAMIN C PO) Take 1 capsule by mouth daily.   aspirin EC 81 MG tablet Take 1 tablet (81 mg total) by mouth daily. Swallow whole.   atorvastatin (LIPITOR) 40 MG tablet Take 2 tablets (80 mg total) by mouth daily.   busPIRone (BUSPAR) 5 MG tablet Take 1 tablet (5 mg total) by mouth 2 (two) times daily.   carvedilol (COREG) 12.5 MG tablet TAKE ONE TABLET BY MOUTH TWICE DAILY WITH BREAKFAST AND DINNER   cholecalciferol (VITAMIN D3) 25 MCG (1000 UNIT) tablet Take 2,000 Units by mouth daily.   empagliflozin (JARDIANCE) 10 MG TABS tablet Take 1 tablet (10 mg total) by mouth daily.   famotidine (PEPCID) 20 MG tablet Take 20 mg by mouth at bedtime.   ferrous sulfate 325 (65 FE) MG EC tablet Take 325 mg by mouth daily with breakfast.   fexofenadine  (ALLEGRA) 180 MG tablet Take 180 mg by mouth daily.   fluticasone (FLONASE) 50 MCG/ACT nasal spray Place 1 spray into both nostrils daily. Use as directed   furosemide (LASIX) 40 MG tablet Take 1 tablet (40 mg total) by mouth daily.   Ginkgo Biloba 40 MG TABS Take 1 tablet (40 mg total) by mouth 2 (two) times daily.   losartan (COZAAR) 25 MG tablet Take 0.5 tablets (12.5 mg total) by mouth at bedtime.   memantine (NAMENDA) 5 MG tablet Take 1 tablet (5 mg total) by mouth at bedtime.   Multiple Vitamin (MULTIVITAMIN WITH MINERALS) TABS tablet Take 1 tablet by mouth daily.   Multiple Vitamins-Minerals (OCUVITE PO) Take 1 capsule by mouth daily.   Oxcarbazepine (TRILEPTAL) 300 MG tablet 1/2 po q am, 1 po at bedtime.   polyethylene glycol (MIRALAX /  GLYCOLAX) 17 g packet Take 17 g by mouth daily.   triamcinolone ointment (KENALOG) 0.5 % Apply 1 application topically as needed.   venlafaxine XR (EFFEXOR-XR) 75 MG 24 hr capsule Take 1 capsule (75 mg total) by mouth 2 (two) times daily.     Review of Systems      All other systems reviewed and are otherwise negative except as noted above.  Physical Exam    VS:  BP 126/78   Pulse 65   Ht 5\' 2"  (1.575 m)   Wt 234 lb (106.1 kg)   SpO2 95%   BMI 42.80 kg/m  , BMI Body mass index is 42.8 kg/m.  Wt Readings from Last 3 Encounters:  04/06/23 234 lb (106.1 kg)  12/31/22 245 lb (111.1 kg)  12/26/22 246 lb (111.6 kg)     GEN: Well nourished, well developed, in no acute distress. HEENT: normal. Neck: Supple, no JVD, carotid bruits, or masses. Cardiac: RRR, no murmurs, rubs, or gallops. No clubbing, cyanosis, edema.  Radials/PT 2+ and equal bilaterally.  Respiratory:  Respirations regular and unlabored, clear to auscultation bilaterally. GI: Soft, nontender, nondistended. MS: No deformity or atrophy. Skin: Warm and dry, no rash. Neuro:  Strength and sensation are intact. Psych: Normal affect.  Assessment & Plan    Chronic diastolic  congestive heart failure -no swelling in her legs and does have some SOB with ambulation but this is nothing new -she has been losing some weight, weighs daily and she knows if she gains a few lbs overnight or 5 lbs in a week she needs to let us know -no swelling, euvolemic on exam today   Cardiac arrest s/p ICD -follows with Dr. Elberta Fortis -No atrial fibrillation on interrogation  Essential hypertension -Continue current medications including amlodipine 5 mg daily, aspirin 81 mg daily, Lipitor 80 mg daily, Coreg 12.5 mg twice daily, Lasix 40 mg daily, Jardiance 10 mg daily Cozaar 12.5 mg daily   BMI 45-49 -encouraged ambulation 30 minutes a day x 5 days a week  5. Sleep Apnea?  -sleep study planned for middle of the month         Disposition: Follow up next available  with Orbie Pyo, MD or APP.  Signed, Sharlene Dory, PA-C 04/06/2023, 10:17 AM  [facility]

## 2023-04-06 ENCOUNTER — Encounter: Payer: Self-pay | Admitting: Physician Assistant

## 2023-04-06 ENCOUNTER — Ambulatory Visit: Payer: Medicare HMO | Attending: Physician Assistant | Admitting: Physician Assistant

## 2023-04-06 VITALS — BP 126/78 | HR 65 | Ht 62.0 in | Wt 234.0 lb

## 2023-04-06 DIAGNOSIS — I10 Essential (primary) hypertension: Secondary | ICD-10-CM | POA: Diagnosis not present

## 2023-04-06 DIAGNOSIS — Z95810 Presence of automatic (implantable) cardiac defibrillator: Secondary | ICD-10-CM | POA: Diagnosis not present

## 2023-04-06 DIAGNOSIS — I5032 Chronic diastolic (congestive) heart failure: Secondary | ICD-10-CM | POA: Diagnosis not present

## 2023-04-06 DIAGNOSIS — Z6841 Body Mass Index (BMI) 40.0 and over, adult: Secondary | ICD-10-CM | POA: Diagnosis not present

## 2023-04-06 LAB — BASIC METABOLIC PANEL
BUN/Creatinine Ratio: 21 (ref 12–28)
BUN: 41 mg/dL — ABNORMAL HIGH (ref 8–27)
CO2: 27 mmol/L (ref 20–29)
Calcium: 10.3 mg/dL (ref 8.7–10.3)
Chloride: 102 mmol/L (ref 96–106)
Creatinine, Ser: 1.96 mg/dL — ABNORMAL HIGH (ref 0.57–1.00)
Glucose: 88 mg/dL (ref 70–99)
Potassium: 4.8 mmol/L (ref 3.5–5.2)
Sodium: 146 mmol/L — ABNORMAL HIGH (ref 134–144)
eGFR: 28 mL/min/{1.73_m2} — ABNORMAL LOW (ref >59)

## 2023-04-06 LAB — MAGNESIUM: Magnesium: 2.4 mg/dL — ABNORMAL HIGH (ref 1.6–2.3)

## 2023-04-06 NOTE — Patient Instructions (Signed)
Medication Instructions:  Your physician recommends that you continue on your current medications as directed. Please refer to the Current Medication list given to you today.  *If you need a refill on your cardiac medications before your next appointment, please call your pharmacy*  Lab Work: BMET, MAG-TODAY If you have labs (blood work) drawn today and your tests are completely normal, you will receive your results only by: MyChart Message (if you have MyChart) OR A paper copy in the mail If you have any lab test that is abnormal or we need to change your treatment, we will call you to review the results.  Follow-Up: At [facility], you and your health needs are our priority.  As part of our continuing mission to provide you with exceptional heart care, we have created designated Provider Care Teams.  These Care Teams include your primary Cardiologist (physician) and Advanced Practice Providers (APPs -  Physician Assistants and Nurse Practitioners) who all work together to provide you with the care you need, when you need it.  Your next appointment:   Next available  Provider:   Orbie Pyo, MD   Low-Sodium Eating Plan Salt (sodium) helps you keep a healthy balance of fluids in your body. Too much sodium can raise your blood pressure. It can also cause fluid and waste to be held in your body. Your health care provider or dietitian may recommend a low-sodium eating plan if you have high blood pressure (hypertension), kidney disease, liver disease, or heart failure. Eating less sodium can help lower your blood pressure and reduce swelling. It can also protect your heart, liver, and kidneys. What are tips for following this plan? Reading food labels  Check food labels for the amount of sodium per serving. If you eat more than one serving, you must multiply the listed amount by the number of servings. Choose foods with less than 140 milligrams (mg) of sodium per serving. Avoid  foods with 300 mg of sodium or more per serving. Always check how much sodium is in a product, even if the label says "unsalted" or "no salt added." Shopping  Buy products labeled as "low-sodium" or "no salt added." Buy fresh foods. Avoid canned foods and pre-made or frozen meals. Avoid canned, cured, or processed meats. Buy breads that have less than 80 mg of sodium per slice. Cooking  Eat more home-cooked food. Try to eat less restaurant, buffet, and fast food. Try not to add salt when you cook. Use salt-free seasonings or herbs instead of table salt or sea salt. Check with your provider or pharmacist before using salt substitutes. Cook with plant-based oils, such as canola, sunflower, or olive oil. Meal planning When eating at a restaurant, ask if your food can be made with less salt or no salt. Avoid dishes labeled as brined, pickled, cured, or smoked. Avoid dishes made with soy sauce, miso, or teriyaki sauce. Avoid foods that have monosodium glutamate (MSG) in them. MSG may be added to some restaurant food, sauces, soups, bouillon, and canned foods. Make meals that can be grilled, baked, poached, roasted, or steamed. These are often made with less sodium. General information Try to limit your sodium intake to 1,500-2,300 mg each day, or the amount told by your provider. What foods should I eat? Fruits Fresh, frozen, or canned fruit. Fruit juice. Vegetables Fresh or frozen vegetables. "No salt added" canned vegetables. "No salt added" tomato sauce and paste. Low-sodium or reduced-sodium tomato and vegetable juice. Grains Low-sodium cereals, such  as oats, puffed wheat and rice, and shredded wheat. Low-sodium crackers. Unsalted rice. Unsalted pasta. Low-sodium bread. Whole grain breads and whole grain pasta. Meats and other proteins Fresh or frozen meat, poultry, seafood, and fish. These should have no added salt. Low-sodium canned tuna and salmon. Unsalted nuts. Dried peas, beans, and  lentils without added salt. Unsalted canned beans. Eggs. Unsalted nut butters. Dairy Milk. Soy milk. Cheese that is naturally low in sodium, such as ricotta cheese, fresh mozzarella, or Swiss cheese. Low-sodium or reduced-sodium cheese. Cream cheese. Yogurt. Seasonings and condiments Fresh and dried herbs and spices. Salt-free seasonings. Low-sodium mustard and ketchup. Sodium-free salad dressing. Sodium-free light mayonnaise. Fresh or refrigerated horseradish. Lemon juice. Vinegar. Other foods Homemade, reduced-sodium, or low-sodium soups. Unsalted popcorn and pretzels. Low-salt or salt-free chips. The items listed above may not be all the foods and drinks you can have. Talk to a dietitian to learn more. What foods should I avoid? Vegetables Sauerkraut, pickled vegetables, and relishes. Olives. [place] fries. Onion rings. Regular canned vegetables, except low-sodium or reduced-sodium items. Regular canned tomato sauce and paste. Regular tomato and vegetable juice. Frozen vegetables in sauces. Grains Instant hot cereals. Bread stuffing, pancake, and biscuit mixes. Croutons. Seasoned rice or pasta mixes. Noodle soup cups. Boxed or frozen macaroni and cheese. Regular salted crackers. Self-rising flour. Meats and other proteins Meat or fish that is salted, canned, smoked, spiced, or pickled. Precooked or cured meat, such as sausages or meat loaves. Tomasa Blase. Ham. Pepperoni. Hot dogs. Corned beef. Chipped beef. Salt pork. Jerky. Pickled herring, anchovies, and sardines. Regular canned tuna. Salted nuts. Dairy Processed cheese and cheese spreads. Hard cheeses. Cheese curds. Blue cheese. Feta cheese. String cheese. Regular cottage cheese. Buttermilk. Canned milk. Fats and oils Salted butter. Regular margarine. Ghee. Bacon fat. Seasonings and condiments Onion salt, garlic salt, seasoned salt, table salt, and sea salt. Canned and packaged gravies. Worcestershire sauce. Tartar sauce. Barbecue sauce. Teriyaki  sauce. Soy sauce, including reduced-sodium soy sauce. Steak sauce. Fish sauce. Oyster sauce. Cocktail sauce. Horseradish that you find on the shelf. Regular ketchup and mustard. Meat flavorings and tenderizers. Bouillon cubes. Hot sauce. Pre-made or packaged marinades. Pre-made or packaged taco seasonings. Relishes. Regular salad dressings. Salsa. Other foods Salted popcorn and pretzels. Corn chips and puffs. Potato and tortilla chips. Canned or dried soups. Pizza. Frozen entrees and pot pies. The items listed above may not be all the foods and drinks you should avoid. Talk to a dietitian to learn more. This information is not intended to replace advice given to you by your health care provider. Make sure you discuss any questions you have with your health care provider. Document Revised: 07/10/2022 Document Reviewed: 07/10/2022 Elsevier Patient Education  2024 [employer] Heart-Healthy Eating Plan Many factors influence your heart health, including eating and exercise habits. Heart health is also called coronary health. Coronary risk increases with abnormal blood fat (lipid) levels. A heart-healthy eating plan includes limiting unhealthy fats, increasing healthy fats, limiting salt (sodium) intake, and making other diet and lifestyle changes. What is my plan? Your health care provider may recommend that: You limit your fat intake to _________% or less of your total calories each day. You limit your saturated fat intake to _________% or less of your total calories each day. You limit the amount of cholesterol in your diet to less than _________ mg per day. You limit the amount of sodium in your diet to less than _________ mg per day. What are tips for following this plan?  Cooking Cook foods using methods other than frying. Baking, boiling, grilling, and broiling are all good options. Other ways to reduce fat include: Removing the skin from poultry. Removing all visible fats from  meats. Steaming vegetables in water or broth. Meal planning  At meals, imagine dividing your plate into fourths: Fill one-half of your plate with vegetables and green salads. Fill one-fourth of your plate with whole grains. Fill one-fourth of your plate with lean protein foods. Eat 2-4 cups of vegetables per day. One cup of vegetables equals 1 cup (91 g) broccoli or cauliflower florets, 2 medium carrots, 1 large bell pepper, 1 large sweet potato, 1 large tomato, 1 medium white potato, 2 cups (150 g) raw leafy greens. Eat 1-2 cups of fruit per day. One cup of fruit equals 1 small apple, 1 large banana, 1 cup (237 g) mixed fruit, 1 large orange,  cup (82 g) dried fruit, 1 cup (240 mL) 100% fruit juice. Eat more foods that contain soluble fiber. Examples include apples, broccoli, carrots, beans, peas, and barley. Aim to get 25-30 g of fiber per day. Increase your consumption of legumes, nuts, and seeds to 4-5 servings per week. One serving of dried beans or legumes equals  cup (90 g) cooked, 1 serving of nuts is  oz (12 almonds, 24 pistachios, or 7 walnut halves), and 1 serving of seeds equals  oz (8 g). Fats Choose healthy fats more often. Choose monounsaturated and polyunsaturated fats, such as olive and canola oils, avocado oil, flaxseeds, walnuts, almonds, and seeds. Eat more omega-3 fats. Choose salmon, mackerel, sardines, tuna, flaxseed oil, and ground flaxseeds. Aim to eat fish at least 2 times each week. Check food labels carefully to identify foods with trans fats or high amounts of saturated fat. Limit saturated fats. These are found in animal products, such as meats, butter, and cream. Plant sources of saturated fats include palm oil, palm kernel oil, and coconut oil. Avoid foods with partially hydrogenated oils in them. These contain trans fats. Examples are stick margarine, some tub margarines, cookies, crackers, and other baked goods. Avoid fried foods. General information Eat  more home-cooked food and less restaurant, buffet, and fast food. Limit or avoid alcohol. Limit foods that are high in added sugar and simple starches such as foods made using white refined flour (white breads, pastries, sweets). Lose weight if you are overweight. Losing just 5-10% of your body weight can help your overall health and prevent diseases such as diabetes and heart disease. Monitor your sodium intake, especially if you have high blood pressure. Talk with your health care provider about your sodium intake. Try to incorporate more vegetarian meals weekly. What foods should I eat? Fruits All fresh, canned (in natural juice), or frozen fruits. Vegetables Fresh or frozen vegetables (raw, steamed, roasted, or grilled). Green salads. Grains Most grains. Choose whole wheat and whole grains most of the time. Rice and pasta, including brown rice and pastas made with whole wheat. Meats and other proteins Lean, well-trimmed beef, veal, pork, and lamb. Chicken and [place] without skin. All fish and shellfish. Wild duck, rabbit, pheasant, and venison. Egg whites or low-cholesterol egg substitutes. Dried beans, peas, lentils, and tofu. Seeds and most nuts. Dairy Low-fat or nonfat cheeses, including ricotta and mozzarella. Skim or 1% milk (liquid, powdered, or evaporated). Buttermilk made with low-fat milk. Nonfat or low-fat yogurt. Fats and oils Non-hydrogenated (trans-free) margarines. Vegetable oils, including soybean, sesame, sunflower, olive, avocado, peanut, safflower, corn, canola, and cottonseed. Salad dressings or  mayonnaise made with a vegetable oil. Beverages Water (mineral or sparkling). Coffee and tea. Unsweetened ice tea. Diet beverages. Sweets and desserts Sherbet, gelatin, and fruit ice. Small amounts of dark chocolate. Limit all sweets and desserts. Seasonings and condiments All seasonings and condiments. The items listed above may not be a complete list of foods and beverages  you can eat. Contact a dietitian for more options. What foods should I avoid? Fruits Canned fruit in heavy syrup. Fruit in cream or butter sauce. Fried fruit. Limit coconut. Vegetables Vegetables cooked in cheese, cream, or butter sauce. Fried vegetables. Grains Breads made with saturated or trans fats, oils, or whole milk. Croissants. Sweet rolls. Donuts. High-fat crackers, such as cheese crackers and chips. Meats and other proteins Fatty meats, such as hot dogs, ribs, sausage, bacon, rib-eye roast or steak. High-fat deli meats, such as salami and bologna. Caviar. Domestic duck and goose. Organ meats, such as liver. Dairy Cream, sour cream, cream cheese, and creamed cottage cheese. Whole-milk cheeses. Whole or 2% milk (liquid, evaporated, or condensed). Whole buttermilk. Cream sauce or high-fat cheese sauce. Whole-milk yogurt. Fats and oils Meat fat, or shortening. Cocoa butter, hydrogenated oils, palm oil, coconut oil, palm kernel oil. Solid fats and shortenings, including bacon fat, salt pork, lard, and butter. Nondairy cream substitutes. Salad dressings with cheese or sour cream. Beverages Regular sodas and any drinks with added sugar. Sweets and desserts Frosting. Pudding. Cookies. Cakes. Pies. Milk chocolate or white chocolate. Buttered syrups. Full-fat ice cream or ice cream drinks. The items listed above may not be a complete list of foods and beverages to avoid. Contact a dietitian for more information. Summary Heart-healthy meal planning includes limiting unhealthy fats, increasing healthy fats, limiting salt (sodium) intake and making other diet and lifestyle changes. Lose weight if you are overweight. Losing just 5-10% of your body weight can help your overall health and prevent diseases such as diabetes and heart disease. Focus on eating a balance of foods, including fruits and vegetables, low-fat or nonfat dairy, lean protein, nuts and legumes, whole grains, and heart-healthy oils  and fats. This information is not intended to replace advice given to you by your health care provider. Make sure you discuss any questions you have with your health care provider. Document Revised: 07/29/2021 Document Reviewed: 07/29/2021 Elsevier Patient Education  2024 [employer].

## 2023-04-08 ENCOUNTER — Telehealth: Payer: Self-pay

## 2023-04-08 ENCOUNTER — Other Ambulatory Visit: Payer: Self-pay | Admitting: *Deleted

## 2023-04-08 DIAGNOSIS — Z79899 Other long term (current) drug therapy: Secondary | ICD-10-CM

## 2023-04-08 NOTE — Telephone Encounter (Signed)
-----   Message from Sharlene Dory sent at 04/08/2023  8:14 AM EDT ----- [name],   Your labs suggest that you are dehydrated. Please stop any supplements that have magnesium and potassium include any drinks with added electrolytes. Please try to get 64 ounces of water a day.   I would like to recheck your kidneys Friday. (BMP)  Thanks! Sharlene Dory, PA-C

## 2023-04-10 ENCOUNTER — Ambulatory Visit: Payer: Medicare HMO

## 2023-04-16 DIAGNOSIS — Z1331 Encounter for screening for depression: Secondary | ICD-10-CM | POA: Diagnosis not present

## 2023-04-16 DIAGNOSIS — Z9181 History of falling: Secondary | ICD-10-CM | POA: Diagnosis not present

## 2023-04-16 DIAGNOSIS — Z0000 Encounter for general adult medical examination without abnormal findings: Secondary | ICD-10-CM | POA: Diagnosis not present

## 2023-04-17 ENCOUNTER — Ambulatory Visit: Payer: Medicare HMO

## 2023-04-20 ENCOUNTER — Ambulatory Visit: Payer: Medicare HMO | Attending: Internal Medicine

## 2023-04-20 ENCOUNTER — Ambulatory Visit (INDEPENDENT_AMBULATORY_CARE_PROVIDER_SITE_OTHER): Payer: Medicare HMO

## 2023-04-20 DIAGNOSIS — E785 Hyperlipidemia, unspecified: Secondary | ICD-10-CM | POA: Diagnosis not present

## 2023-04-20 DIAGNOSIS — Z95810 Presence of automatic (implantable) cardiac defibrillator: Secondary | ICD-10-CM

## 2023-04-20 DIAGNOSIS — Z79899 Other long term (current) drug therapy: Secondary | ICD-10-CM | POA: Diagnosis not present

## 2023-04-20 DIAGNOSIS — I5032 Chronic diastolic (congestive) heart failure: Secondary | ICD-10-CM

## 2023-04-20 DIAGNOSIS — G4733 Obstructive sleep apnea (adult) (pediatric): Secondary | ICD-10-CM | POA: Diagnosis not present

## 2023-04-21 ENCOUNTER — Telehealth: Payer: Self-pay

## 2023-04-21 ENCOUNTER — Telehealth: Payer: Self-pay | Admitting: *Deleted

## 2023-04-21 DIAGNOSIS — E785 Hyperlipidemia, unspecified: Secondary | ICD-10-CM

## 2023-04-21 LAB — LIPID PANEL
Chol/HDL Ratio: 3 {ratio} (ref 0.0–4.4)
Cholesterol, Total: 175 mg/dL (ref 100–199)
HDL: 58 mg/dL (ref >39)
LDL Chol Calc (NIH): 94 mg/dL (ref 0–99)
Triglycerides: 129 mg/dL (ref 0–149)
VLDL Cholesterol Cal: 23 mg/dL (ref 5–40)

## 2023-04-21 LAB — BASIC METABOLIC PANEL
BUN/Creatinine Ratio: 23 (ref 12–28)
BUN: 39 mg/dL — ABNORMAL HIGH (ref 8–27)
CO2: 26 mmol/L (ref 20–29)
Calcium: 10 mg/dL (ref 8.7–10.3)
Chloride: 104 mmol/L (ref 96–106)
Creatinine, Ser: 1.67 mg/dL — ABNORMAL HIGH (ref 0.57–1.00)
Glucose: 85 mg/dL (ref 70–99)
Potassium: 4.8 mmol/L (ref 3.5–5.2)
Sodium: 142 mmol/L (ref 134–144)
eGFR: 33 mL/min/{1.73_m2} — ABNORMAL LOW (ref >59)

## 2023-04-21 LAB — HEPATIC FUNCTION PANEL
ALT: 17 [IU]/L (ref 0–32)
AST: 17 [IU]/L (ref 0–40)
Albumin: 3.9 g/dL (ref 3.9–4.9)
Alkaline Phosphatase: 162 [IU]/L — ABNORMAL HIGH (ref 44–121)
Bilirubin Total: 0.3 mg/dL (ref 0.0–1.2)
Bilirubin, Direct: <0.1 mg/dL (ref 0.00–0.40)
Total Protein: 6.5 g/dL (ref 6.0–8.5)

## 2023-04-21 MED ORDER — EZETIMIBE 10 MG PO TABS: 10.0000 mg | ORAL_TABLET | Freq: Every day | ORAL | 3 refills | Status: DC

## 2023-04-21 NOTE — Telephone Encounter (Signed)
Remote ICM transmission received.  Attempted call to daughter [name] per DPR regarding ICM remote transmission and left detailed message per DPR with ICM phone number to return call.  Left ICM phone number and advised to return call for any fluid symptoms or questions.

## 2023-04-21 NOTE — Progress Notes (Signed)
EPIC Encounter for ICM Monitoring  Patient Name: Marie Ramos is a 67 y.o. female Date: 04/21/2023 Primary Care Physican: Daisy Floro, MD Primary Cardiologist: Lynnette Caffey Electrophysiologist: Elberta Fortis 03/02/2023 Weight: 234.5 lbs 03/31/2023 Weight: 236.2 lbs   Time in AT/AF  0.0 hr/day (0.0%)          Attempted call to daughter [name] per DPR and unable to reach.  Left detailed message per DPR regarding transmission. Transmission reviewed.               Diet:  Daughter helps with diet when needed and encourages low salt.         Optivol thoracic impedance suggesting ongoing possible fluid accumulation starting 7/2.  Impedance suggesting worsening fluid accumulation 10/8.   Pt was not volume overloaded by 9/30 OV exam.   Fluid index greater than normal threshold starting 7/17.   Prescribed:  Furosemide 40 mg Take 1 tablet (40 mg total) by mouth daily.      Jardiance 10 mg take 1 tablet by mouth daily.   Labs: 04/20/2023 Creatinine 1.67, BUN 39, Potassium 4.8, Sodium 142, GFR 33 04/06/2023 Creatinine 1.96, BUN 41, Potassium 4.8, Sodium 146, GFR 28 01/07/2023 Creatinine 1.83, BUN 52, Potassium 5.2, Sodium 141, GFR 30 A complete set of results can be found in Results Review.   Recommendations:  Left voice mail with ICM number and encouraged monitor pt for fluid symptoms and call if condition changes.        Follow-up plan: ICM clinic phone appointment on 05/04/2023.  91 day device clinic remote transmission 05/14/2023.      EP/Cardiology Office Visits:  09/02/2023 with Dr Lynnette Caffey.   Recall 08/02/2023 with Francis Dowse, PA.     Copy of ICM check sent to Dr. Elberta Fortis and Jari Favre, PA for review if patient or daughter is reached.     3 month ICM trend: 04/20/2023.    12-14 Month ICM trend:     [name], RN 04/21/2023 9:21 AM

## 2023-04-21 NOTE — Telephone Encounter (Signed)
Reviewed w patient's daughter recommendation to add Zetia 10 mg daily and repeat labs in about 8 weeks.  She voices understanding and will communicate this w patient.  Prescription sent to [facility] per request.   In regard to ICM call:  patient's daughter said she saw her this past Saturday and weight was low 230s/high 220s.  She said Sleep study doctor is recommending CPAP.   No extra fluid or swelling noted, no shortness of breath.  Advised I will pass this on to [facility] nurse and Dr. Gershon Crane nurse.

## 2023-04-21 NOTE — Telephone Encounter (Signed)
-----   Message from Orbie Pyo sent at 04/21/2023  6:54 AM EDT ----- Add zetia 10mg , lipids/lfts in 2 mo, goal LDL < 55 given elevated Lp(a)

## 2023-05-04 ENCOUNTER — Ambulatory Visit: Payer: Medicare HMO | Attending: Cardiology

## 2023-05-04 DIAGNOSIS — Z95810 Presence of automatic (implantable) cardiac defibrillator: Secondary | ICD-10-CM | POA: Diagnosis not present

## 2023-05-04 DIAGNOSIS — I5032 Chronic diastolic (congestive) heart failure: Secondary | ICD-10-CM

## 2023-05-05 NOTE — Progress Notes (Signed)
EPIC Encounter for ICM Monitoring  Patient Name: Marie Ramos is a 67 y.o. female Date: 05/05/2023 Primary Care Physican: Marie Floro, MD Primary Cardiologist: Marie Ramos Electrophysiologist: Marie Ramos 03/02/2023 Weight: 234.5 lbs 03/31/2023 Weight: 236.2 lbs 04/06/2023 Weight: 234 lbs   Time in AT/AF  0.0 hr/day (0.0%)          Spoke with daughter Marie Ramos per DPR.  She reports patient is feeling very well and no fluid symptoms.   Pt's weight remains stable at 234 lbs and has lost about 30 lbs with the last 6 months.   Highest weight was 268 lbs              Diet:  Daughter helps with diet when needed and encourages low salt.         Optivol thoracic impedance suggesting ongoing chronic possible fluid accumulation starting 7/2.  Impedance suggesting worsening fluid accumulation 10/8.          Pt was not volume overloaded by 9/30 OV exam.   Fluid index greater than normal threshold starting 7/17.   Prescribed:  Furosemide 40 mg Take 1 tablet (40 mg total) by mouth daily.      Jardiance 10 mg take 1 tablet by mouth daily.   Labs: 04/20/2023 Creatinine 1.67, BUN 39, Potassium 4.8, Sodium 142, GFR 33 04/06/2023 Creatinine 1.96, BUN 41, Potassium 4.8, Sodium 146, GFR 28 01/07/2023 Creatinine 1.83, BUN 52, Potassium 5.2, Sodium 141, GFR 30 A complete set of results can be found in Results Review.   Recommendations:  Advised to monitor for fluid symptoms and to call if she has changes in condition.     Follow-up plan: ICM clinic phone appointment on 06/08/2023.  91 day device clinic remote transmission 05/14/2023.      EP/Cardiology Office Visits:  09/02/2023 with Dr Marie Ramos.   Recall 08/02/2023 with Marie Dowse, PA.     Copy of ICM check sent to Dr. Elberta Ramos and Dr Marie Ramos as Lorain Childes.  3 month ICM trend: 05/04/2023.    12-14 Month ICM trend:     Marie Soda, RN 05/05/2023 2:02 PM

## 2023-05-14 ENCOUNTER — Ambulatory Visit (INDEPENDENT_AMBULATORY_CARE_PROVIDER_SITE_OTHER): Payer: Medicare HMO

## 2023-05-14 DIAGNOSIS — I428 Other cardiomyopathies: Secondary | ICD-10-CM

## 2023-05-14 LAB — CUP PACEART REMOTE DEVICE CHECK
Battery Remaining Longevity: 25 mo
Battery Voltage: 2.93 V
Brady Statistic AP VP Percent: 0 %
Brady Statistic AP VS Percent: 7.08 %
Brady Statistic AS VP Percent: 0.06 %
Brady Statistic AS VS Percent: 92.85 %
Brady Statistic RA Percent Paced: 7.09 %
Brady Statistic RV Percent Paced: 0.07 %
Date Time Interrogation Session: 20241107022822
HighPow Impedance: 80 Ohm
Implantable Lead Connection Status: 753985
Implantable Lead Connection Status: 753985
Implantable Lead Implant Date: 20170227
Implantable Lead Implant Date: 20170227
Implantable Lead Location: 753859
Implantable Lead Location: 753860
Implantable Lead Model: 5076
Implantable Pulse Generator Implant Date: 20170227
Lead Channel Impedance Value: 361 Ohm
Lead Channel Impedance Value: 418 Ohm
Lead Channel Impedance Value: 456 Ohm
Lead Channel Pacing Threshold Amplitude: 0.625 V
Lead Channel Pacing Threshold Amplitude: 0.75 V
Lead Channel Pacing Threshold Pulse Width: 0.4 ms
Lead Channel Pacing Threshold Pulse Width: 0.4 ms
Lead Channel Sensing Intrinsic Amplitude: 21.5 mV
Lead Channel Sensing Intrinsic Amplitude: 21.5 mV
Lead Channel Sensing Intrinsic Amplitude: 3.375 mV
Lead Channel Sensing Intrinsic Amplitude: 3.375 mV
Lead Channel Setting Pacing Amplitude: 2 V
Lead Channel Setting Pacing Amplitude: 2.5 V
Lead Channel Setting Pacing Pulse Width: 0.4 ms
Lead Channel Setting Sensing Sensitivity: 0.3 mV
Zone Setting Status: 755011
Zone Setting Status: 755011

## 2023-05-18 ENCOUNTER — Other Ambulatory Visit: Payer: Medicare HMO

## 2023-05-19 ENCOUNTER — Other Ambulatory Visit: Payer: Self-pay

## 2023-05-19 ENCOUNTER — Telehealth: Payer: Self-pay | Admitting: Physician Assistant

## 2023-05-19 MED ORDER — BUSPIRONE HCL 5 MG PO TABS: 5.0000 mg | ORAL_TABLET | Freq: Two times a day (BID) | ORAL | 0 refills | Status: DC

## 2023-05-19 NOTE — Telephone Encounter (Signed)
Sent!

## 2023-05-19 NOTE — Telephone Encounter (Signed)
Pt's husband, Gala Romney, called at 9:29a.  He is on the [facility].  He is requesting refill of Buspirone to   [facility] # [id number] [place] - [address] [place] [address] Phone: [phone]  Fax: [phone]   Next appt 12/3

## 2023-05-26 NOTE — Progress Notes (Signed)
Remote ICD transmission.   

## 2023-06-08 ENCOUNTER — Ambulatory Visit: Payer: Medicare HMO | Attending: Cardiology

## 2023-06-08 DIAGNOSIS — I5032 Chronic diastolic (congestive) heart failure: Secondary | ICD-10-CM | POA: Diagnosis not present

## 2023-06-08 DIAGNOSIS — Z95810 Presence of automatic (implantable) cardiac defibrillator: Secondary | ICD-10-CM

## 2023-06-09 ENCOUNTER — Ambulatory Visit (INDEPENDENT_AMBULATORY_CARE_PROVIDER_SITE_OTHER): Payer: Self-pay | Admitting: Physician Assistant

## 2023-06-09 DIAGNOSIS — Z91199 Patient's noncompliance with other medical treatment and regimen due to unspecified reason: Secondary | ICD-10-CM

## 2023-06-09 NOTE — Progress Notes (Signed)
No show

## 2023-06-09 NOTE — Progress Notes (Signed)
EPIC Encounter for ICM Monitoring  Patient Name: Marie Ramos is a 67 y.o. female Date: 06/09/2023 Primary Care Physican: Marie Floro, MD Primary Cardiologist: Marie Ramos Electrophysiologist: Marie Ramos 03/02/2023 Weight: 234.5 lbs 03/31/2023 Weight: 236.2 lbs 04/06/2023 Weight: 234 lbs 06/09/2023 Weight: 220 lbs   Time in AT/AF  0.0 hr/day (0.0%)          Spoke with daughter Marie Ramos per DPR.  She reports patient is doing very well and has no fluid symptoms.  She continues to diet and has lost about 40 lbs.   Highest weight was 268 lbs              Diet:  Daughter helps with diet when needed and encourages low salt.         Optivol thoracic impedance suggesting chronic possible fluid accumulation starting 7/2.  Impedance suggesting worsening fluid accumulation 10/8.          Pt was not volume overloaded by 9/30 OV exam.   Fluid index greater than normal threshold starting 7/17.   Prescribed:  Furosemide 40 mg Take 1 tablet (40 mg total) by mouth daily.      Jardiance 10 mg take 1 tablet by mouth daily.   Labs: 04/20/2023 Creatinine 1.67, BUN 39, Potassium 4.8, Sodium 142, GFR 33 04/06/2023 Creatinine 1.96, BUN 41, Potassium 4.8, Sodium 146, GFR 28 01/07/2023 Creatinine 1.83, BUN 52, Potassium 5.2, Sodium 141, GFR 30 A complete set of results can be found in Results Review.   Recommendations:  Chronic decreased thoracic impedance does not correlate to any pt symptoms.  Will send message at next EP visit to discuss if Optivol needs recalibrating since patient has significant weight loss.  Advised to monitor for fluid symptoms and to call if patient has changes in condition.     Follow-up plan: ICM clinic phone appointment on 07/20/2023.  91 day device clinic remote transmission 08/13/2023.      EP/Cardiology Office Visits:  09/02/2023 with Dr Marie Ramos.   Recall 08/02/2023 with Marie Dowse, PA.     Copy of ICM check sent to Dr. Elberta Ramos.  3 month ICM trend:  06/08/2023.    12-14 Month ICM trend:     [name], RN 06/09/2023 1:22 PM

## 2023-07-15 ENCOUNTER — Encounter: Payer: Self-pay | Admitting: Physician Assistant

## 2023-07-15 ENCOUNTER — Ambulatory Visit: Payer: Medicare HMO | Admitting: Physician Assistant

## 2023-07-15 DIAGNOSIS — F319 Bipolar disorder, unspecified: Secondary | ICD-10-CM

## 2023-07-15 DIAGNOSIS — F5105 Insomnia due to other mental disorder: Secondary | ICD-10-CM

## 2023-07-15 DIAGNOSIS — F99 Mental disorder, not otherwise specified: Secondary | ICD-10-CM | POA: Diagnosis not present

## 2023-07-15 DIAGNOSIS — F411 Generalized anxiety disorder: Secondary | ICD-10-CM

## 2023-07-15 MED ORDER — BUSPIRONE HCL 5 MG PO TABS: 5.0000 mg | ORAL_TABLET | Freq: Two times a day (BID) | ORAL | 3 refills | Status: DC

## 2023-07-15 MED ORDER — OXCARBAZEPINE 300 MG PO TABS: ORAL_TABLET | ORAL | 11 refills | Status: DC

## 2023-07-15 NOTE — Progress Notes (Signed)
 [facility]  Patient ID: Marie Ramos,  MRN: [id number]  PCP: Okey Carlin Redbird, MD  Date of Evaluation: 07/15/2023 Time spent:25 minutes  Chief Complaint:  Chief Complaint   Anxiety; Depression; Follow-up    HISTORY/CURRENT STATUS: For medication follow-up.  Husband, Georgette, is with her.   Georgette says she's been more melancholy, not wanting to do things like go out, doesn't want to cook which she used to enjoy. She agrees, she doesn't much care of things get done or not. She watches tv most of the time. Denies being depressed though. 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  No extreme sadness, tearfulness, or feelings of hopelessness.  Sleeps well most of the time. ADLs and personal hygiene are normal.   Denies any changes in concentration, making decisions, or remembering things.  Appetite has not changed.  Weight is stable.  Denies suicidal or homicidal thoughts.  Patient denies increased energy with decreased need for sleep, increased talkativeness, racing thoughts, impulsivity or risky behaviors, increased spending, increased libido, grandiosity, increased irritability or anger, paranoia, or hallucinations.  Denies dizziness, syncope, seizures, numbness, tingling, tremor, tics, unsteady gait, slurred speech, confusion. Denies muscle or joint pain, stiffness, or dystonia. Denies unexplained weight loss, frequent infections, or sores that heal slowly.  No polyphagia, polydipsia, or polyuria. Denies visual changes or paresthesias.   Individual Medical History/ Review of Systems: Changes? :No      Past medications for mental health diagnoses include: Xanax , lithium caused diarrhea and increased thirst, Trileptal , Effexor  XR, Seroquel  worked well, Abilify unknown what happened, Adderall, Risperdal  helped mania but caused bilateral lower extremity edema and dry mouth.  Allergies: Sulfa antibiotics, Albuterol , Other, Sulfamethoxazole-trimethoprim, Vicodin hp  [hydrocodone-acetaminophen ], Naproxen, and Vicodin [hydrocodone-acetaminophen ]  Current Medications:  Current Outpatient Medications:    Acetylcysteine (NAC) 600 MG CAPS, Take 1 capsule (600 mg total) by mouth daily., Disp: 30 capsule, Rfl: 11   amLODipine  (NORVASC ) 5 MG tablet, Take 1 tablet (5 mg total) by mouth daily with breakfast., Disp: 30 tablet, Rfl: 0   Ascorbic Acid  (VITAMIN C PO), Take 1 capsule by mouth daily., Disp: , Rfl:    aspirin  EC 81 MG tablet, Take 1 tablet (81 mg total) by mouth daily. Swallow whole., Disp: 90 tablet, Rfl: 3   atorvastatin  (LIPITOR) 40 MG tablet, Take 2 tablets (80 mg total) by mouth daily., Disp: , Rfl:    carvedilol  (COREG ) 12.5 MG tablet, TAKE ONE TABLET BY MOUTH TWICE DAILY WITH BREAKFAST AND DINNER, Disp: 180 tablet, Rfl: 2   cholecalciferol (VITAMIN D3) 25 MCG (1000 UNIT) tablet, Take 2,000 Units by mouth daily., Disp: , Rfl:    empagliflozin  (JARDIANCE ) 10 MG TABS tablet, Take 1 tablet (10 mg total) by mouth daily., Disp: 30 tablet, Rfl: 11   ezetimibe  (ZETIA ) 10 MG tablet, Take 1 tablet (10 mg total) by mouth daily., Disp: 90 tablet, Rfl: 3   famotidine  (PEPCID ) 20 MG tablet, Take 20 mg by mouth at bedtime., Disp: , Rfl:    ferrous sulfate 325 (65 FE) MG EC tablet, Take 325 mg by mouth daily with breakfast., Disp: , Rfl:    fexofenadine (ALLEGRA) 180 MG tablet, Take 180 mg by mouth daily., Disp: , Rfl:    fluticasone  (FLONASE ) 50 MCG/ACT nasal spray, Place 1 spray into both nostrils daily. Use as directed, Disp: , Rfl:    furosemide  (LASIX ) 40 MG tablet, Take 1 tablet (40 mg total) by mouth daily., Disp: 90 tablet, Rfl: 3   Ginkgo Biloba 40  MG TABS, Take 1 tablet (40 mg total) by mouth 2 (two) times daily., Disp: 180 tablet, Rfl: 11   losartan  (COZAAR ) 25 MG tablet, Take 0.5 tablets (12.5 mg total) by mouth at bedtime., Disp: 45 tablet, Rfl: 3   memantine  (NAMENDA ) 5 MG tablet, Take 1 tablet (5 mg total) by mouth at bedtime., Disp: 90 tablet, Rfl: 1    Multiple Vitamin (MULTIVITAMIN WITH MINERALS) TABS tablet, Take 1 tablet by mouth daily., Disp: , Rfl:    Multiple Vitamins-Minerals (OCUVITE PO), Take 1 capsule by mouth daily., Disp: , Rfl:    polyethylene glycol (MIRALAX  / GLYCOLAX ) 17 g packet, Take 17 g by mouth daily., Disp: 14 each, Rfl: 0   triamcinolone  ointment (KENALOG ) 0.5 %, Apply 1 application topically as needed., Disp: , Rfl: 0   venlafaxine  XR (EFFEXOR -XR) 75 MG 24 hr capsule, Take 1 capsule (75 mg total) by mouth 2 (two) times daily., Disp: 60 capsule, Rfl: 11   busPIRone  (BUSPAR ) 5 MG tablet, Take 1 tablet (5 mg total) by mouth 2 (two) times daily., Disp: 180 tablet, Rfl: 3   Oxcarbazepine  (TRILEPTAL ) 300 MG tablet, 1/2 po q am, 1 po at bedtime., Disp: 45 tablet, Rfl: 11 Medication Side Effects: none  Family Medical/ Social History: Changes?  no  MENTAL HEALTH EXAM:  There were no vitals taken for this visit.There is no height or weight on file to calculate BMI.  General Appearance: Casual, Well Groomed, and Obese  Eye Contact:  Good  Speech:  Clear and Coherent and Normal Rate  Volume:  Normal  Mood:  Euthymic  Affect:  Congruent       Thought Process:  Goal Directed and Descriptions of Associations: Circumstantial  Orientation:  Full (Time, Place, and Person)  Thought Content: Logical   Suicidal Thoughts:  No  Homicidal Thoughts:  No  Memory:  Immediate;   Fair Recent;   Fair Remote;   Good  Judgement:  Good  Insight:  Good  Psychomotor Activity:   using a walker  Concentration:  Concentration: Good and Attention Span: Good  Recall:  Good  Fund of Knowledge: Good  Language: Good  Assets:  Communication Skills Desire for Improvement Financial Resources/Insurance Housing Resilience Social Support Transportation  ADL's:  Intact  Cognition: WNL  Prognosis:  Good   DIAGNOSES:    ICD-10-CM   1. Bipolar I disorder (HCC)  F31.9     2. Generalized anxiety disorder  F41.1     3. Insomnia due to other  mental disorder  F51.05    F99       Receiving Psychotherapy: No   RECOMMENDATIONS:  PDMP reviewed. Last Xanax  filled 10/24/2022. I provided 20 minutes of face to face time during this encounter, including time spent before and after the visit in records review, medical decision making, counseling pertinent to today's visit, and charting.   We discussed the melancholy. Pt nor [name] want to make any changes but wanted me to know what's going on. They let me know if things change and we'll change meds as appropriate.   Continue Buspar  5 mg, 1 po bid.  Continue Namenda  5 mg at bedtime, per neurology. Continue oxcarbazepine  300 mg to 1/2 pill p.o. every morning and 1 p.o. nightly. Continue  Effexor  XR 75 mg, 1 po bid.  Continue vitamins as per med list.   Return in 3 months.     Avoid drugs that are known to cause prolonged QT interval  Verneita Cooks, PA-C

## 2023-07-19 ENCOUNTER — Encounter: Payer: Self-pay | Admitting: Physician Assistant

## 2023-07-20 ENCOUNTER — Ambulatory Visit: Payer: Medicare HMO | Attending: Cardiology

## 2023-07-20 DIAGNOSIS — Z95810 Presence of automatic (implantable) cardiac defibrillator: Secondary | ICD-10-CM | POA: Diagnosis not present

## 2023-07-20 DIAGNOSIS — I5032 Chronic diastolic (congestive) heart failure: Secondary | ICD-10-CM | POA: Diagnosis not present

## 2023-07-21 ENCOUNTER — Telehealth: Payer: Self-pay

## 2023-07-21 NOTE — Telephone Encounter (Signed)
 Remote ICM transmission received.  Attempted call to daughter per DPR and no answer.

## 2023-07-21 NOTE — Progress Notes (Signed)
 EPIC Encounter for ICM Monitoring  Patient Name: Marie Ramos is a 68 y.o. female Date: 07/21/2023 Primary Care Physican: Marie Carlin Redbird, MD Primary Cardiologist: Marie Ramos Electrophysiologist: Marie Ramos Highest weight: 268 lbs 03/02/2023 Weight: 234.5 lbs 03/31/2023 Weight: 236.2 lbs 04/06/2023 Weight: 234 lbs 06/09/2023 Weight: 220 lbs   Time in AT/AF  0.0 hr/day (0.0%)          Attempted ICM Call to daughter Marie Ramos per DPR and unable to reach.              Diet:  Daughter helps with diet when needed and encourages low salt.         Optivol thoracic impedance chronically decreased since 01/06/23  suggesting possible fluid accumulation but patient does not typically experience fluid symptoms.   Impedance showing slight improvement 07/13/2023.       Pt was not volume overloaded by 9/30 OV exam.   Fluid index greater than normal threshold starting 7/17.   Prescribed:  Furosemide  40 mg Take 1 tablet (40 mg total) by mouth daily.      Jardiance  10 mg take 1 tablet by mouth daily.   Labs: 04/20/2023 Creatinine 1.67, BUN 39, Potassium 4.8, Sodium 142, GFR 33 04/06/2023 Creatinine 1.96, BUN 41, Potassium 4.8, Sodium 146, GFR 28 01/07/2023 Creatinine 1.83, BUN 52, Potassium 5.2, Sodium 141, GFR 30 A complete set of results can be found in Results Review.   Recommendations:  Unable to reach.     Follow-up plan: ICM clinic phone appointment on 08/24/2023.  91 day device clinic remote transmission 08/13/2023.      EP/Cardiology Office Visits:  09/02/2023 with Dr Marie Ramos.   Recall 08/02/2023 with Marie Arthur, PA.     Copy of ICM check sent to Dr. [name].  3 month ICM trend: 07/20/2023.    12-14 Month ICM trend:     Marie GORMAN Garner, RN 07/21/2023 1:21 PM

## 2023-07-28 NOTE — Progress Notes (Unsigned)
  Electrophysiology Office Note:   ID:  Ramos, Marie 12-14-1955, MRN [id number]  Primary Cardiologist: Orbie Pyo, MD Electrophysiologist: Regan Lemming, MD  {Click to update primary MD,subspecialty MD or APP then REFRESH:1}    History of Present Illness:   Marie Ramos is a 68 y.o. female with h/o Bipolar 1, VF arrest s/p PPM, DVT, HCM, and CVA seen today for routine electrophysiology followup.   Since last being seen in our clinic the patient reports doing ***.  she denies chest pain, palpitations, dyspnea, PND, orthopnea, nausea, vomiting, dizziness, syncope, edema, weight gain, or early satiety.   Review of systems complete and found to be negative unless listed in HPI.   EP Information / Studies Reviewed:    EKG is ordered today. Personal review as below.       ICD Interrogation-  reviewed in detail today,  See PACEART report.  Device History: Medtronic Dual Chamber ICD implanted 2017 for VF arrest and QT prolongation  Echo 08/2022 LVEF 55%, severe LVH, grade 1 DD, trivial MR  Physical Exam:   VS:  There were no vitals taken for this visit.   Wt Readings from Last 3 Encounters:  04/06/23 234 lb (106.1 kg)  12/31/22 245 lb (111.1 kg)  12/26/22 246 lb (111.6 kg)     GEN: No acute distress *** NECK: No JVD; No carotid bruits CARDIAC: {EPRHYTHM:28826}, no murmurs, rubs, gallops RESPIRATORY:  Clear to auscultation without rales, wheezing or rhonchi  ABDOMEN: Soft, non-tender, non-distended EXTREMITIES:  {EDEMA LEVEL:28147::"No"} edema; No deformity   ASSESSMENT AND PLAN:    Ventricular arrhythmia  s/p Medtronic dual chamber ICD  euvolemic today Stable on an appropriate medical regimen Normal ICD function See Pace Art report No changes today  Disposition:   Follow up with {EPPROVIDERS:28135} {EPFOLLOW UP:28173}   Signed, Graciella Freer, PA-C

## 2023-07-29 ENCOUNTER — Ambulatory Visit: Payer: Medicare HMO | Attending: Student | Admitting: Student

## 2023-07-29 ENCOUNTER — Encounter: Payer: Self-pay | Admitting: Student

## 2023-07-29 VITALS — BP 108/66 | HR 56 | Ht 62.0 in | Wt 215.6 lb

## 2023-07-29 DIAGNOSIS — I469 Cardiac arrest, cause unspecified: Secondary | ICD-10-CM | POA: Diagnosis not present

## 2023-07-29 DIAGNOSIS — I428 Other cardiomyopathies: Secondary | ICD-10-CM

## 2023-07-29 DIAGNOSIS — Z95810 Presence of automatic (implantable) cardiac defibrillator: Secondary | ICD-10-CM

## 2023-07-29 LAB — CUP PACEART INCLINIC DEVICE CHECK
Battery Remaining Longevity: 24 mo
Battery Voltage: 2.93 V
Brady Statistic AP VP Percent: 0.01 %
Brady Statistic AP VS Percent: 6.49 %
Brady Statistic AS VP Percent: 0.08 %
Brady Statistic AS VS Percent: 93.42 %
Brady Statistic RA Percent Paced: 6.5 %
Brady Statistic RV Percent Paced: 0.09 %
Date Time Interrogation Session: 20250122104956
HighPow Impedance: 77 Ohm
Implantable Lead Connection Status: 753985
Implantable Lead Connection Status: 753985
Implantable Lead Implant Date: 20170227
Implantable Lead Implant Date: 20170227
Implantable Lead Location: 753859
Implantable Lead Location: 753860
Implantable Lead Model: 5076
Implantable Pulse Generator Implant Date: 20170227
Lead Channel Impedance Value: 399 Ohm
Lead Channel Impedance Value: 418 Ohm
Lead Channel Impedance Value: 456 Ohm
Lead Channel Pacing Threshold Amplitude: 0.625 V
Lead Channel Pacing Threshold Amplitude: 0.875 V
Lead Channel Pacing Threshold Pulse Width: 0.4 ms
Lead Channel Pacing Threshold Pulse Width: 0.4 ms
Lead Channel Sensing Intrinsic Amplitude: 19 mV
Lead Channel Sensing Intrinsic Amplitude: 2.625 mV
Lead Channel Sensing Intrinsic Amplitude: 20.25 mV
Lead Channel Sensing Intrinsic Amplitude: 4.25 mV
Lead Channel Setting Pacing Amplitude: 2 V
Lead Channel Setting Pacing Amplitude: 2.5 V
Lead Channel Setting Pacing Pulse Width: 0.4 ms
Lead Channel Setting Sensing Sensitivity: 0.3 mV
Zone Setting Status: 755011
Zone Setting Status: 755011

## 2023-07-29 NOTE — Patient Instructions (Signed)
Medication Instructions:  Your physician recommends that you continue on your current medications as directed. Please refer to the Current Medication list given to you today.  *If you need a refill on your cardiac medications before your next appointment, please call your pharmacy*  Lab Work: None ordered If you have labs (blood work) drawn today and your tests are completely normal, you will receive your results only by: MyChart Message (if you have MyChart) OR A paper copy in the mail If you have any lab test that is abnormal or we need to change your treatment, we will call you to review the results.  Follow-Up: At [facility], you and your health needs are our priority.  As part of our continuing mission to provide you with exceptional heart care, we have created designated Provider Care Teams.  These Care Teams include your primary Cardiologist (physician) and Advanced Practice Providers (APPs -  Physician Assistants and Nurse Practitioners) who all work together to provide you with the care you need, when you need it.  Your next appointment:   1 year(s)  Provider:   Loman Brooklyn, MD or [facility]" [place], [place]

## 2023-08-13 ENCOUNTER — Ambulatory Visit (INDEPENDENT_AMBULATORY_CARE_PROVIDER_SITE_OTHER): Payer: Medicare HMO

## 2023-08-13 DIAGNOSIS — I428 Other cardiomyopathies: Secondary | ICD-10-CM

## 2023-08-13 LAB — CUP PACEART REMOTE DEVICE CHECK
Battery Remaining Longevity: 25 mo
Battery Voltage: 2.93 V
Brady Statistic AP VP Percent: 0.01 %
Brady Statistic AP VS Percent: 7.93 %
Brady Statistic AS VP Percent: 0.09 %
Brady Statistic AS VS Percent: 91.97 %
Brady Statistic RA Percent Paced: 7.94 %
Brady Statistic RV Percent Paced: 0.1 %
Date Time Interrogation Session: 20250206033623
HighPow Impedance: 76 Ohm
Implantable Lead Connection Status: 753985
Implantable Lead Connection Status: 753985
Implantable Lead Implant Date: 20170227
Implantable Lead Implant Date: 20170227
Implantable Lead Location: 753859
Implantable Lead Location: 753860
Implantable Lead Model: 5076
Implantable Pulse Generator Implant Date: 20170227
Lead Channel Impedance Value: 361 Ohm
Lead Channel Impedance Value: 399 Ohm
Lead Channel Impedance Value: 475 Ohm
Lead Channel Pacing Threshold Amplitude: 0.625 V
Lead Channel Pacing Threshold Amplitude: 0.875 V
Lead Channel Pacing Threshold Pulse Width: 0.4 ms
Lead Channel Pacing Threshold Pulse Width: 0.4 ms
Lead Channel Sensing Intrinsic Amplitude: 2.5 mV
Lead Channel Sensing Intrinsic Amplitude: 2.5 mV
Lead Channel Sensing Intrinsic Amplitude: 21.125 mV
Lead Channel Sensing Intrinsic Amplitude: 21.125 mV
Lead Channel Setting Pacing Amplitude: 2 V
Lead Channel Setting Pacing Amplitude: 2.5 V
Lead Channel Setting Pacing Pulse Width: 0.4 ms
Lead Channel Setting Sensing Sensitivity: 0.3 mV
Zone Setting Status: 755011
Zone Setting Status: 755011

## 2023-08-15 ENCOUNTER — Other Ambulatory Visit: Payer: Self-pay | Admitting: Physician Assistant

## 2023-08-24 ENCOUNTER — Ambulatory Visit: Payer: Medicare HMO | Attending: Cardiology

## 2023-08-24 DIAGNOSIS — Z95810 Presence of automatic (implantable) cardiac defibrillator: Secondary | ICD-10-CM

## 2023-08-24 DIAGNOSIS — I5032 Chronic diastolic (congestive) heart failure: Secondary | ICD-10-CM

## 2023-08-26 NOTE — Progress Notes (Signed)
 EPIC Encounter for ICM Monitoring  Patient Name: Marie Ramos is a 68 y.o. female Date: 08/26/2023 Primary Care Physican: Daisy Floro, MD Primary Cardiologist: Lynnette Caffey Electrophysiologist: Elberta Fortis Highest weight: 268 lbs 03/02/2023 Weight: 234.5 lbs 03/31/2023 Weight: 236.2 lbs 04/06/2023 Weight: 234 lbs 06/09/2023 Weight: 220 lbs 08/26/2023 Weight: 210 lbs   Time in AT/AF  0.0 hr/day (0.0%)          Spoke with daughter [name] per DPR and heart failure questions reviewed.  Pt is doing well with no complaints.  Asymptomatic for fluid accumulation.  She continues to lose weight by cutting back on foods.              Diet:  Daughter helps with diet when needed and encourages low salt.         Optivol thoracic impedance suggesting possible fluid accumulation from 1/27-2/11.   Prescribed:  Furosemide 40 mg Take 1 tablet (40 mg total) by mouth daily.      Jardiance 10 mg take 1 tablet by mouth daily.   Labs: 04/20/2023 Creatinine 1.67, BUN 39, Potassium 4.8, Sodium 142, GFR 33 04/06/2023 Creatinine 1.96, BUN 41, Potassium 4.8, Sodium 146, GFR 28 01/07/2023 Creatinine 1.83, BUN 52, Potassium 5.2, Sodium 141, GFR 30 A complete set of results can be found in Results Review.   Recommendations:  No changes and encouraged to call if experiencing any fluid symptoms.     Follow-up plan: ICM clinic phone appointment on 09/28/2023.  91 day device clinic remote transmission 11/12/2023.      EP/Cardiology Office Visits:  09/02/2023 with Dr Lynnette Caffey.   Recall 07/23/2024 with Dr Elberta Fortis.     Copy of ICM check sent to Dr. Elberta Fortis.  3 month ICM trend: 08/24/2023.    12-14 Month ICM trend:     [name], RN 08/26/2023 8:44 AM

## 2023-08-28 NOTE — Progress Notes (Signed)
 Cardiology Office Note:   Date:  09/02/2023  ID:  Marie Ramos, Marie Ramos 1956-04-07, MRN [id number] PCP:  Daisy Floro, MD  [facility] Providers Cardiologist:  Alverda Skeans, MD Referring MD: Daisy Floro, MD  Chief Complaint/Reason for Referral: Follow-up for diastolic heart failure ASSESSMENT:    1. Chronic diastolic congestive heart failure (HCC)   2. CKD (chronic kidney disease) stage 4, GFR 15-29 ml/min (HCC)   3. Cardiac arrest (HCC)   4. Essential hypertension   5. Cerebrovascular accident (CVA), unspecified mechanism (HCC)   6. Hyperlipidemia LDL goal <55   7. Aortic atherosclerosis (HCC)   8. BMI 45.0-49.9, adult (HCC)     PLAN:   In order of problems listed above: Chronic diastolic heart failure: Continue Jardiance 10 mg daily, Lasix 40 mg daily, losartan 25 mg daily.  Reluctant to start spironolactone given degree of renal dysfunction. CKD stage IV: Continue Jardiance 10 mg daily and losartan 25 mg daily for renal protection. Cardiac arrest: Status post ICD; followed by EP. Hypertension: Blood pressure is well-controlled today. History of stroke: Check lipid panel and LFTs today, goal LDL is less than 55.  If not at goal will refer to pharmacy for recommendations. Hyperlipidemia: Check lipid panel and LFTs today.  Continue atorvastatin 80 mg and Zetia 10 mg. Aortic atherosclerosis: Continue aspirin 81 mg daily, Lipitor 40 mg daily, Zetia 10 mg daily, and aggressive blood pressure control. Elevated BMI: Continue diet and exercise modification           Dispo:  Return in about 6 months (around 03/01/2024).      Medication Adjustments/Labs and Tests Ordered: Current medicines are reviewed at length with the patient today.  Concerns regarding medicines are outlined above.  The following changes have been made:  no change   Labs/tests ordered: Orders Placed This Encounter  Procedures   Lipid panel   Hepatic function panel    Medication  Changes: No orders of the defined types were placed in this encounter.   Current medicines are reviewed at length with the patient today.  The patient does not have concerns regarding medicines.  I spent 33 minutes reviewing all clinical data during and prior to this visit including all relevant imaging studies, laboratories, clinical information from other health systems and prior notes from both Cardiology and other specialties, interviewing the patient, conducting a complete physical examination, and coordinating care in order to formulate a comprehensive and personalized evaluation and treatment plan.   History of Present Illness:      FOCUSED PROBLEM LIST:   VF arrest >> long QT Cardiac cath 2017 no CAD Status post ICD Diastolic dysfunction Severe LVH, G1 DD, EF 55%, no significant valve problems TTE 2024 CKD stage IV History of stroke Subclinical AF >> Eliquis Per EP May 2023 very low burden AF >> DC Eliquis Hypertension Hyperlipidemia LP(a) 135 Aortic atherosclerosis Chest x-ray 2024 Cognitive impairment Followed by neurology BMI 49 First-degree AV block  May 2023: The patient is a 68 y.o. female with the indicated medical history here to establish general cardiology care.  The patient is followed by the EP division given her subclinical atrial fibrillation and ICD placement for ventricular fibrillatory arrest thought due to long QT syndrome.  The patient contacted the office recently due to shortness of breath.   The patient has been short of breath for many years.  I reviewed her weights over the last several years and they have been stable.  Shortness of breath is not  a new issue.  She noticed a little bit more peripheral edema particularly in the evenings.  This is sometimes associated with difficulty putting her shoes on.  She tells me she is short of breath with walking.  She is not short of breath at rest.  She is unsure if she is short of breath when she lies down but  she only uses 1 pillow to sleep.  She denies any exertional angina, presyncope, syncope, palpitations, signs or symptoms of stroke, or severe bleeding while on Eliquis.  She has required no emergency room visits or hospitalizations.  Plan: Start Jardiance 10 mg daily and Lasix 20 mg twice daily; stop Eliquis due to very low atrial fibrillation burden and start aspirin 81 mg.  Refer to pharmacy given elevated BMI.   6/24: In the interim the patient had her ICD interrogated which showed no atrial fibrillation.  Echocardiogram demonstrated preserved LV function with ejection fraction 55% with no significant valvular abnormalities.  She was seen in this division in February and was doing fairly well.  The patient missed her appointment with me in May.  She was admitted at that time with heart failure and falls.  She was diuresed with Lasix and transition to Lasix 40 mg twice daily.  She was seen in heart failure clinic for follow-up.  Dietary indiscretion at SNF was noted.  Her Lasix was increased to 60 mg for 2 days then back to 40 mg daily.  ARB, spironolactone or Entresto was deferred given elevated creatinine.  [place] Pepper was continued.  She recently returned home from the facility.  She is here with her daughter.  Her daughter is quite cognizant of indiscretion in regards to her diet and his modifying her diet.  Her daughter used to be a Investment banker, operational.  The patient has developed cognitive impairment and saw neurology recently.  She was started on Namenda.  She denies any shortness of breath, and her edema is improved after pulsed dosing of Lasix recently.  Her weight is down to around 246 pounds.  They are weighing her every day.  She denies any exertional angina or exertional dyspnea.  She has a pretty low functional capacity and does not do much in a day.  She has not had any recurrent falls.  Plan: Start losartan 12.5 mg, check BMP in 1 week, check lipid panel LFTs and LP(a).  2/25: Patient agrees to use of AI  platform for visit today.  In the interim the patient's lipid panel was not at goal.  Her atorvastatin was increased to 40 mg and then 80 mg.  Her LDL in October was still above goal especially given her history of stroke and Zetia was added to her regimen.  She was seen by general cardiology and September 2024 and was doing well.  No changes made to medical regimen.  She was seen by EP in January and was doing well.  Blood pressure is well-controlled, and she is no longer on blood thinners following a decision by the electrophysiology department. No chest pain, lightheadedness, or episodes of syncope. She has not required any emergency room visits or hospitalizations since last summer.  She is currently taking atorvastatin at a dose of 80 mg per day, divided into four 20 mg pills, to achieve a target LDL of less than 50 mg/dL due to her history of stroke. Her LDL was 94 mg/dL when last checked in October.  She has experienced weight loss from 245 pounds last year to approximately 210-211 pounds  currently, attributed to her cooking and maintaining a bland diet. Despite the weight loss, she continues to use a walker due to knee issues.  She monitors her oxygen levels, which are around 95% during the day. No significant leg swelling. No fevers, chills, nausea, or vomiting. She reports feeling 'more sleepier in the morning,' which she attributes to taking nine pills in the morning.          Current Medications: Current Meds  Medication Sig   Acetylcysteine (NAC) 600 MG CAPS Take 1 capsule (600 mg total) by mouth daily.   amLODipine (NORVASC) 5 MG tablet Take 1 tablet (5 mg total) by mouth daily with breakfast.   Ascorbic Acid (VITAMIN C PO) Take 1 capsule by mouth daily.   aspirin EC 81 MG tablet Take 1 tablet (81 mg total) by mouth daily. Swallow whole.   atorvastatin (LIPITOR) 20 MG tablet Take 80 mg by mouth daily.   busPIRone (BUSPAR) 5 MG tablet Take 1 tablet (5 mg total) by mouth 2 (two)  times daily.   carvedilol (COREG) 25 MG tablet Take 25 mg by mouth 2 (two) times daily.   cholecalciferol (VITAMIN D3) 25 MCG (1000 UNIT) tablet Take 2,000 Units by mouth daily.   empagliflozin (JARDIANCE) 10 MG TABS tablet Take 1 tablet (10 mg total) by mouth daily.   ezetimibe (ZETIA) 10 MG tablet Take 1 tablet (10 mg total) by mouth daily.   famotidine (PEPCID) 20 MG tablet Take 20 mg by mouth at bedtime.   ferrous sulfate 325 (65 FE) MG EC tablet Take 325 mg by mouth daily with breakfast.   fexofenadine (ALLEGRA) 180 MG tablet Take 180 mg by mouth daily.   fluticasone (FLONASE) 50 MCG/ACT nasal spray Place 1 spray into both nostrils daily. Use as directed   furosemide (LASIX) 40 MG tablet Take 1 tablet (40 mg total) by mouth daily.   losartan (COZAAR) 25 MG tablet Take 0.5 tablets (12.5 mg total) by mouth at bedtime.   memantine (NAMENDA) 5 MG tablet Take 1 tablet (5 mg total) by mouth at bedtime.   Multiple Vitamins-Minerals (OCUVITE PO) Take 1 capsule by mouth daily.   Oxcarbazepine (TRILEPTAL) 300 MG tablet 1/2 po q am, 1 po at bedtime.   polyethylene glycol (MIRALAX / GLYCOLAX) 17 g packet Take 17 g by mouth daily. (Patient taking differently: Take 17 g by mouth as needed.)   triamcinolone ointment (KENALOG) 0.5 % Apply 1 application topically as needed.   venlafaxine XR (EFFEXOR-XR) 75 MG 24 hr capsule Take 1 capsule (75 mg total) by mouth 2 (two) times daily.     Review of Systems:   Please see the history of present illness.    All other systems reviewed and are negative.     EKGs/Labs/Other Test Reviewed:   EKG: EKG from January 2025 demonstrates sinus bradycardia with first-degree AV block  EKG Interpretation Date/Time:    Ventricular Rate:    PR Interval:    QRS Duration:    QT Interval:    QTC Calculation:   R Axis:      Text Interpretation:           Risk Assessment/Calculations:          Physical Exam:   VS:  BP 122/72   Pulse 62   Ht 5\' 2"  (1.575 m)    Wt 210 lb 6.4 oz (95.4 kg)   SpO2 97%   BMI 38.48 kg/m        Wt Readings  from Last 3 Encounters:  09/02/23 210 lb 6.4 oz (95.4 kg)  07/29/23 215 lb 9.6 oz (97.8 kg)  04/06/23 234 lb (106.1 kg)      GENERAL:  No apparent distress, AOx3 HEENT:  No carotid bruits, +2 carotid impulses, no scleral icterus CAR: RRR no murmurs, gallops, rubs, or thrills RES:  Clear to auscultation bilaterally ABD:  Soft, nontender, nondistended, positive bowel sounds x 4 VASC:  +2 radial pulses, +2 carotid pulses NEURO:  CN 2-12 grossly intact; motor and sensory grossly intact PSYCH:  No active depression or anxiety EXT:  No edema, ecchymosis, or cyanosis  Signed, Orbie Pyo, MD  09/02/2023 8:55 AM    [facility] [address] [place], [place], [place]  [address] Phone: [phone]; Fax: [phone]   Note:  This document was prepared using Dragon voice recognition software and may include unintentional dictation errors.

## 2023-09-02 ENCOUNTER — Ambulatory Visit: Payer: Medicare HMO | Attending: Internal Medicine | Admitting: Internal Medicine

## 2023-09-02 ENCOUNTER — Encounter: Payer: Self-pay | Admitting: Internal Medicine

## 2023-09-02 VITALS — BP 122/72 | HR 62 | Ht 62.0 in | Wt 210.4 lb

## 2023-09-02 DIAGNOSIS — N184 Chronic kidney disease, stage 4 (severe): Secondary | ICD-10-CM | POA: Diagnosis not present

## 2023-09-02 DIAGNOSIS — I10 Essential (primary) hypertension: Secondary | ICD-10-CM | POA: Diagnosis not present

## 2023-09-02 DIAGNOSIS — I469 Cardiac arrest, cause unspecified: Secondary | ICD-10-CM | POA: Diagnosis not present

## 2023-09-02 DIAGNOSIS — I5032 Chronic diastolic (congestive) heart failure: Secondary | ICD-10-CM | POA: Diagnosis not present

## 2023-09-02 DIAGNOSIS — E785 Hyperlipidemia, unspecified: Secondary | ICD-10-CM | POA: Diagnosis not present

## 2023-09-02 DIAGNOSIS — Z6841 Body Mass Index (BMI) 40.0 and over, adult: Secondary | ICD-10-CM | POA: Diagnosis not present

## 2023-09-02 DIAGNOSIS — I700 Atherosclerosis of aorta: Secondary | ICD-10-CM

## 2023-09-02 DIAGNOSIS — I639 Cerebral infarction, unspecified: Secondary | ICD-10-CM | POA: Diagnosis not present

## 2023-09-02 LAB — LIPID PANEL
Chol/HDL Ratio: 2.6 ratio (ref 0.0–4.4)
Cholesterol, Total: 144 mg/dL (ref 100–199)
HDL: 56 mg/dL (ref >39)
LDL Chol Calc (NIH): 66 mg/dL (ref 0–99)
Triglycerides: 123 mg/dL (ref 0–149)
VLDL Cholesterol Cal: 22 mg/dL (ref 5–40)

## 2023-09-02 LAB — HEPATIC FUNCTION PANEL
ALT: 19 IU/L (ref 0–32)
AST: 21 IU/L (ref 0–40)
Albumin: 4.1 g/dL (ref 3.9–4.9)
Alkaline Phosphatase: 206 IU/L — ABNORMAL HIGH (ref 44–121)
Bilirubin Total: 0.3 mg/dL (ref 0.0–1.2)
Bilirubin, Direct: 0.09 mg/dL (ref 0.00–0.40)
Total Protein: 7.1 g/dL (ref 6.0–8.5)

## 2023-09-02 NOTE — Patient Instructions (Signed)
 Medication Instructions:  Your physician recommends that you continue on your current medications as directed. Please refer to the Current Medication list given to you today.  *If you need a refill on your cardiac medications before your next appointment, please call your pharmacy*  Lab Work: TODAY: lipid panel, LFTs If you have labs (blood work) drawn today and your tests are completely normal, you will receive your results only by: MyChart Message (if you have MyChart) OR A paper copy in the mail If you have any lab test that is abnormal or we need to change your treatment, we will call you to review the results.  Follow-Up: At [facility], you and your health needs are our priority.  As part of our continuing mission to provide you with exceptional heart care, we have created designated Provider Care Teams.  These Care Teams include your primary Cardiologist (physician) and Advanced Practice Providers (APPs -  Physician Assistants and Nurse Practitioners) who all work together to provide you with the care you need, when you need it.  Your next appointment:   6 month(s)  The format for your next appointment:   In Person  Provider:   Jari Favre, PA-C   Other Instructions   1st Floor: - Lobby - Registration  - Pharmacy  - Lab - Cafe  2nd Floor: - PV Lab - Diagnostic Testing (echo, CT, nuclear med)  3rd Floor: - Vacant  4th Floor: - TCTS (cardiothoracic surgery) - AFib Clinic - Structural Heart Clinic - Vascular Surgery  - Vascular Ultrasound  5th Floor: - [facility] (general and EP) - Clinical Pharmacy for coumadin, hypertension, lipid, weight-loss medications, and med management appointments    Valet parking services will be available as well.

## 2023-09-03 ENCOUNTER — Encounter: Payer: Self-pay | Admitting: Internal Medicine

## 2023-09-17 NOTE — Progress Notes (Signed)
 Remote ICD transmission.

## 2023-09-24 DIAGNOSIS — I10 Essential (primary) hypertension: Secondary | ICD-10-CM | POA: Diagnosis not present

## 2023-09-24 DIAGNOSIS — R899 Unspecified abnormal finding in specimens from other organs, systems and tissues: Secondary | ICD-10-CM | POA: Diagnosis not present

## 2023-09-24 DIAGNOSIS — R7303 Prediabetes: Secondary | ICD-10-CM | POA: Diagnosis not present

## 2023-09-24 DIAGNOSIS — E7800 Pure hypercholesterolemia, unspecified: Secondary | ICD-10-CM | POA: Diagnosis not present

## 2023-09-24 DIAGNOSIS — R634 Abnormal weight loss: Secondary | ICD-10-CM | POA: Diagnosis not present

## 2023-09-28 ENCOUNTER — Ambulatory Visit: Payer: Medicare HMO | Attending: Cardiology

## 2023-09-28 DIAGNOSIS — Z95810 Presence of automatic (implantable) cardiac defibrillator: Secondary | ICD-10-CM

## 2023-09-28 DIAGNOSIS — I5032 Chronic diastolic (congestive) heart failure: Secondary | ICD-10-CM | POA: Diagnosis not present

## 2023-10-02 NOTE — Progress Notes (Signed)
 EPIC Encounter for ICM Monitoring  Patient Name: Marie Ramos is a 68 y.o. female Date: 10/02/2023 Primary Care Physican: Daisy Floro, MD Primary Cardiologist: Lynnette Caffey Electrophysiologist: Camnitz Highest weight: 268 lbs 03/02/2023 Weight: 234.5 lbs 03/31/2023 Weight: 236.2 lbs 04/06/2023 Weight: 234 lbs 06/09/2023 Weight: 220 lbs 08/26/2023 Weight: 210 lbs 09/02/2023 Office Weight: 210 lbs 10/02/2023 Weight: 208 lbs   Time in AT/AF  0.0 hr/day (0.0%)          Spoke with daughter Marie Ramos per DPR.  Patient is doing well but is being referred to a nephrologist due to recent change in labs.  She was in [place] for a funeral which correlates with decreased impedance.                Diet:  Daughter helps with diet when needed and encourages low salt.         Optivol thoracic impedance suggesting possible fluid accumulation from 3/8-3/18.   Prescribed:  Furosemide 40 mg Take 1 tablet (40 mg total) by mouth daily.      Jardiance 10 mg take 1 tablet by mouth daily.   Labs: 04/20/2023 Creatinine 1.67, BUN 39, Potassium 4.8, Sodium 142, GFR 33 04/06/2023 Creatinine 1.96, BUN 41, Potassium 4.8, Sodium 146, GFR 28 01/07/2023 Creatinine 1.83, BUN 52, Potassium 5.2, Sodium 141, GFR 30 A complete set of results can be found in Results Review.   Recommendations:  No changes and encouraged to call if pt is experiencing any fluid symptoms.     Follow-up plan: ICM clinic phone appointment on 11/02/2023.  91 day device clinic remote transmission 11/12/2023.      EP/Cardiology Office Visits:  Recall 02/29/2024 with Jari Favre, PA.   Recall 07/23/2024 with Dr Elberta Fortis.     Copy of ICM check sent to Dr. Elberta Fortis.  3 month ICM trend: 09/28/2023.    12-14 Month ICM trend:     [name], RN 10/02/2023 8:43 AM

## 2023-10-08 ENCOUNTER — Encounter: Payer: Self-pay | Admitting: Psychology

## 2023-10-08 DIAGNOSIS — H3530 Unspecified macular degeneration: Secondary | ICD-10-CM | POA: Insufficient documentation

## 2023-10-08 DIAGNOSIS — G4733 Obstructive sleep apnea (adult) (pediatric): Secondary | ICD-10-CM | POA: Insufficient documentation

## 2023-10-09 ENCOUNTER — Ambulatory Visit: Payer: Medicare HMO | Admitting: Psychology

## 2023-10-09 ENCOUNTER — Ambulatory Visit: Payer: Self-pay | Admitting: Psychology

## 2023-10-09 ENCOUNTER — Encounter: Payer: Self-pay | Admitting: Psychology

## 2023-10-09 DIAGNOSIS — R4189 Other symptoms and signs involving cognitive functions and awareness: Secondary | ICD-10-CM | POA: Diagnosis not present

## 2023-10-09 DIAGNOSIS — I6340 Cerebral infarction due to embolism of unspecified cerebral artery: Secondary | ICD-10-CM | POA: Diagnosis not present

## 2023-10-09 NOTE — Progress Notes (Signed)
 NEUROPSYCHOLOGICAL EVALUATION [facility] Department of Neurology  Date of Evaluation: October 09, 2023  Reason for Referral:   Marie Ramos is a 68 y.o. right-handed Caucasian female referred by Marlowe Kays, PA-C, to characterize her current cognitive functioning and assist with diagnostic clarity and treatment planning in the context of subjective cognitive decline.   Assessment and Plan:   Clinical Impression(s): Scores across stand-alone performance validity measures were consistently below expectation and relatively far from appropriate clinical cut-offs. I am unaware of any known secondary gain which could be achieved with poor performance and personally believe that Marie Ramos was appropriately engaged throughout the testing process. However, given validity task performances, current testing should be interpreted with caution as obtained scores may underestimate true abilities to an unknown degree.  If taken at face value, Marie Ramos's pattern of performance is suggestive of fairly diffuse cognitive impairment. Severe impairment was exhibited surrounding processing speed, executive functioning, verbal fluency, and visuospatial abilities. Further weakness/variability was exhibited across all aspects of learning and memory. Performances were appropriate relative to age-matched peers across attention/concentration, receptive language, and confrontation naming. Functionally, her husband took over medication and financial management after her hospitalization in May 2024. While he has not returned these to her at the present time, he did express optimism that he could at some point in the future. She does not drive and there were concerns surrounding her getting lost prior to her hospitalization. Per medical records, her daughter has expressed concern for cognitive decline for the past 6-7 years, potentially starting around the time of her cardiac arrest and  stroke. Overall, while I do have some concern for an underlying dementia presentation, below expectation performances across validity tasks makes it challenging to understand the true extent of cognitive impairment and render a formal diagnosis at the present time.   Current testing patterns could reflect a primary vascular etiology given her prior embolic stroke and potential watershed ischemia, as well as numerous chronic medical ailments which negatively impact the heart and brain. This as the primary etiology would appear most likely in my opinion. Despite memory weaknesses, she does not exhibit a classic Alzheimer's disease profile at the present time. Despite poor performances across recognition trials, retention rates were appropriate, as was performance across confrontation naming. She does not display behavioral characteristics of Lewy body disease, another more rare parkinsonian presentation, or frontotemporal lobar degeneration. Continued medical monitoring will be important moving forward.   Recommendations: A repeat neuropsychological evaluation in 12-18 months could be considered to assess the trajectory of future cognitive decline should it occur. This will also aid in future efforts towards improved diagnostic clarity.  Marie Ramos has already been prescribed a medication aimed to address memory loss and concerns surrounding cognitive decline (i.e., memantine/Namenda). She is encouraged to continue taking this medication as prescribed. It is important to highlight that this medication has been shown to slow functional decline in some individuals. There is no current treatment which can stop or reverse cognitive decline when caused by a neurodegenerative illness.   Untreated sleep apnea will increase her risk for additional heart attack, additional stroke, and dementia. She is strongly encouraged to utilize a CPAP machine nightly or meet with her sleep specialists and adhere to other  recommendations.   Performance across neurocognitive testing is not a strong predictor of an individual's safety operating a motor vehicle. With that being said, I do agree with their decision to have her fully abstain from driving behaviors  pending the completion of a formal driving evaluation. Should her family wish to pursue a formalized driving evaluation, they could reach out to the following agencies: [employer] in [place]: [phone] [facility]: [phone] [facility]: [phone] [facility]: [phone] or [phone]  Should there be progression of current deficits over time, Marie Ramos is unlikely to regain any independent living skills lost. Therefore, it is recommended that she remain as involved as possible in all aspects of household chores, finances, and medication management, with supervision to ensure adequate performance. She will likely benefit from the establishment and maintenance of a routine in order to maximize her functional abilities over time.  It will be important for Marie Ramos to have another person with her when in situations where she may need to process information, weigh the pros and cons of different options, and make decisions, in order to ensure that she fully understands and recalls all information to be considered.  If not already done, Marie Ramos and her family may want to discuss her wishes regarding durable power of attorney and medical decision making, so that she can have input into these choices. If they require legal assistance with this, long-term care resource access, or other aspects of estate planning, they could reach out to [employer] at [phone] for a free consultation. Additionally, they may wish to discuss future plans for caretaking and seek out community options for in home/residential care should they become necessary.  Marie Ramos is encouraged to attend to lifestyle  factors for brain health (e.g., regular physical exercise, good nutrition habits and consideration of the MIND-DASH diet, regular participation in cognitively-stimulating activities, and general stress management techniques), which are likely to have benefits for both emotional adjustment and cognition. In fact, in addition to promoting good general health, regular exercise incorporating aerobic activities (e.g., brisk walking, jogging, cycling, etc.) has been demonstrated to be a very effective treatment for depression and stress, with similar efficacy rates to both antidepressant medication and psychotherapy. Optimal control of vascular risk factors (including safe cardiovascular exercise and adherence to dietary recommendations) is encouraged. Continued participation in activities which provide mental stimulation and social interaction is also recommended.   Important information should be provided to Marie Ramos in written format in all instances. This information should be placed in a highly frequented and easily visible location within her home to promote recall. External strategies such as written notes in a consistently used memory journal, visual and nonverbal auditory cues such as a calendar on the refrigerator or appointments with alarm, such as on a cell phone, can also help maximize recall.  When learning new information, she would benefit from information being broken up into small, manageable pieces. She may find it helpful to articulate the material in her own words and in a context to promote encoding at the onset of a new task. This material may need to be repeated multiple times to promote encoding. She may also understand and retain new information better if it is presented to her in a meaningful or well-organized manner at the outset, such as grouping items into meaningful categories or presenting information in an outlined, bulleted, or story format.  To address problems with processing  speed, she may wish to consider:   -Ensuring that she is alerted when essential material or instructions are being presented   -Adjusting the speed at which new information is presented   -Allowing for more time in comprehending, processing, and responding in conversation   -  Repeating and paraphrasing instructions or conversations aloud  To address problems with fluctuating attention and/or executive dysfunction, she may wish to consider:   -Avoiding external distractions when needing to concentrate   -Limiting exposure to fast paced environments with multiple sensory demands   -Writing down complicated information and using checklists   -Attempting and completing one task at a time (i.e., no multi-tasking)   -Verbalizing aloud each step of a task to maintain focus   -Taking frequent breaks during the completion of steps/tasks to avoid fatigue   -Reducing the amount of information considered at one time   -Scheduling more difficult activities for a time of day where she is usually most alert  Review of Records:   Marie Ramos was seen by [facility] Marvel Plan, M.D.) on 12/05/2015 for outpatient follow-up following a recent stroke. Briefly, she was admitted on 08/24/2015 for post VFib arrest with resultant cardioembolic infarcts seen on MRI which showed bilateral scattered supra and infratentorial foci of acute infarcts. Stroke etiology was likely due to combination of hypoperfusion s/p cardiac arrest and later cardiac cath procedure. Arrhythmia such as atrial fibrillation could not be ruled out however. MRA showed mild atherosclerosis. CUS and TCD bubble study was negative. EF was 40-45% but LE venous doppler was found to have LLE DVT. LDL was 115 and A1C was 6.2. She was put on Eliquis and Lipitor. She also had an ICD placed by cardiology. She recovered well and was discharged in good condition.   Marie Ramos was seen in the ED on 11/24/2022 following a mechanical fall. There  had been a history of frequent falling behaviors in the months preceding her hospitalization. Prior to her admission, her furosemide dose was increased due to volume overload; however she continued to experience weakness and deconditioning. Medical records suggest congestive heart failure and acute hypoxic respiratory failure. Her echo suggested ejection fraction at 55% with severe hypertrophy of the septal segment. A chest x-ray suggested cardiomegaly pulmonary vascular congestion. Head CT was negative. Her doctors theorized that a high dose of carbamazepine was contributing to prior frequent falls and unsteady gait. These variables were improved with a lowered dose. She was ultimately discharged from the hospital on 12/02/2022 and spent additional weeks at a rehabilitation center.  She was seen by [facility] Marlowe Kays, PA-C) on 12/26/2022 for an evaluation of memory loss. At that time, her daughter reported concerns for memory deterioration for the past 6-7 years (worse during the prior two months). Her daughter noted that they threw Marie Ramos a large party for her 60th birthday and she seemed to have no recollection of this event for example. Functionally, her husband had taken over medication and financial management and Marie Ramos was not driving. Performance on a brief cognitive screening instrument (MOCA) was 18/30. Ultimately, Ms. Natal was referred for a comprehensive neuropsychological evaluation to characterize her cognitive abilities and to assist with diagnostic clarity and treatment planning.   Neuroimaging: Brain MRI on 09/01/2015 revealed scattered supra and infratentorial foci of acute ischemia measuring up to 11 mm, which may represent embolic phenomena, possible component of watershed ischemia. Mild background microvascular disease was also noted. Head CT on 08/16/2019 in the context of altered mental status was negative. Head CT on 11/24/2022 in the context of a fall was negative.  Brain MRI on 03/30/2023 suggested mild progression of microvascular ischemic disease but was otherwise stable.   Past Medical History:  Diagnosis Date   Abnormality of gait    Acute lower  UTI    Acute metabolic encephalopathy 08/16/2019   Acute on chronic diastolic CHF (congestive heart failure) 11/24/2022   Acute respiratory failure with hypoxia    AICD (automatic cardioverter/defibrillator) present 12/05/2015   AKI (acute kidney injury)    Benign essential hypertension    Bipolar I disorder in remission    Cardiac arrest 08/24/2015   concern for hypoxic encephalopathic presentation   Cerebrovascular accident (CVA) due to embolism of cerebral artery 08/2015   Scattered supra and infratentorial foci of acute ischemia measuring up to 11 mm, which may represent embolic phenomena, possible component of watershed ischemia   Chronic kidney disease (CKD)    Chronically dry eyes    Class 3 obesity 11/29/2021   Closed fracture of left distal radius 12/01/2018   Concussion 11/2022   DVT (deep venous thrombosis) 12/05/2015   Dyspnea    r/t seasonal allergies   E. coli UTI 09/05/2015   HLD (hyperlipidemia) 12/05/2015   Hypertrophic obstructive cardiomyopathy 08/29/2015   Hypokalemia 08/29/2015   Leukocytosis    Macular degeneration    Notalgia    Obstructive sleep apnea    Pneumonia due to COVID-19 virus 08/16/2019   Pulmonary vascular congestion 08/16/2019   QT prolongation 08/29/2015   Systolic dysfunction with acute on chronic heart failure    Thrombocytosis    Ventricular fibrillation 09/03/2015   MDT ICD Dr. Johney Frame    Past Surgical History:  Procedure Laterality Date   ABDOMINAL HYSTERECTOMY     CARDIAC CATHETERIZATION N/A 08/24/2015   Procedure: Left Heart Cath and Coronary Angiography;  Surgeon: Tonny Bollman, MD;  Location: [facility] INVASIVE CV LAB;  Service: Cardiovascular;  Laterality: N/A;   EP IMPLANTABLE DEVICE N/A 09/03/2015   MDT dual chamber ICD   OPEN REDUCTION INTERNAL  FIXATION (ORIF) DISTAL RADIAL FRACTURE Left 12/01/2018   Procedure: OPEN REDUCTION INTERNAL FIXATION (ORIF) LEFT DISTAL RADIAL FRACTURE;  Surgeon: Bjorn Pippin, MD;  Location: [facility] ORS;  Service: Orthopedics;  Laterality: Left;   SPLENECTOMY, TOTAL  2011   TONSILLECTOMY      Current Outpatient Medications:    Acetylcysteine (NAC) 600 MG CAPS, Take 1 capsule (600 mg total) by mouth daily., Disp: 30 capsule, Rfl: 11   amLODipine (NORVASC) 5 MG tablet, Take 1 tablet (5 mg total) by mouth daily with breakfast., Disp: 30 tablet, Rfl: 0   Ascorbic Acid (VITAMIN C PO), Take 1 capsule by mouth daily., Disp: , Rfl:    aspirin EC 81 MG tablet, Take 1 tablet (81 mg total) by mouth daily. Swallow whole., Disp: 90 tablet, Rfl: 3   atorvastatin (LIPITOR) 20 MG tablet, Take 80 mg by mouth daily., Disp: , Rfl:    busPIRone (BUSPAR) 5 MG tablet, Take 1 tablet (5 mg total) by mouth 2 (two) times daily., Disp: 180 tablet, Rfl: 3   carvedilol (COREG) 25 MG tablet, Take 25 mg by mouth 2 (two) times daily., Disp: , Rfl:    cholecalciferol (VITAMIN D3) 25 MCG (1000 UNIT) tablet, Take 2,000 Units by mouth daily., Disp: , Rfl:    empagliflozin (JARDIANCE) 10 MG TABS tablet, Take 1 tablet (10 mg total) by mouth daily., Disp: 30 tablet, Rfl: 11   ezetimibe (ZETIA) 10 MG tablet, Take 1 tablet (10 mg total) by mouth daily., Disp: 90 tablet, Rfl: 3   famotidine (PEPCID) 20 MG tablet, Take 20 mg by mouth at bedtime., Disp: , Rfl:    ferrous sulfate 325 (65 FE) MG EC tablet, Take 325 mg by mouth daily with  breakfast., Disp: , Rfl:    fexofenadine (ALLEGRA) 180 MG tablet, Take 180 mg by mouth daily., Disp: , Rfl:    fluticasone (FLONASE) 50 MCG/ACT nasal spray, Place 1 spray into both nostrils daily. Use as directed, Disp: , Rfl:    furosemide (LASIX) 40 MG tablet, Take 1 tablet (40 mg total) by mouth daily., Disp: 90 tablet, Rfl: 3   losartan (COZAAR) 25 MG tablet, Take 0.5 tablets (12.5 mg total) by mouth at bedtime., Disp: 45  tablet, Rfl: 3   memantine (NAMENDA) 5 MG tablet, Take 1 tablet (5 mg total) by mouth at bedtime., Disp: 90 tablet, Rfl: 1   Multiple Vitamin (MULTIVITAMIN WITH MINERALS) TABS tablet, Take 1 tablet by mouth daily. (Patient not taking: Reported on 09/02/2023), Disp: , Rfl:    Multiple Vitamins-Minerals (OCUVITE PO), Take 1 capsule by mouth daily., Disp: , Rfl:    Oxcarbazepine (TRILEPTAL) 300 MG tablet, 1/2 po q am, 1 po at bedtime., Disp: 45 tablet, Rfl: 11   polyethylene glycol (MIRALAX / GLYCOLAX) 17 g packet, Take 17 g by mouth daily. (Patient taking differently: Take 17 g by mouth as needed.), Disp: 14 each, Rfl: 0   triamcinolone ointment (KENALOG) 0.5 %, Apply 1 application topically as needed., Disp: , Rfl: 0   venlafaxine XR (EFFEXOR-XR) 75 MG 24 hr capsule, Take 1 capsule (75 mg total) by mouth 2 (two) times daily., Disp: 60 capsule, Rfl: 11  Clinical Interview:   The following information was obtained during a clinical interview with Marie Ramos and her husband prior to cognitive testing.  Cognitive Symptoms: Decreased short-term memory: Endorsed. She was fairly vague when describing memory concerns, noting that she was "always bad with names." She appeared unsure when asked directly about trouble recalling details of recent conversation or events. She denied trouble misplacing or losing things in her environment. Her husband was largely in agreement. Medical records suggest that her daughter has expressed concern for memory decline during the prior 6-7 years, potentially coinciding with her prior cardiac arrest event and 2017 stroke.  Decreased long-term memory: Denied. Decreased attention/concentration: Endorsed. She answered "yes and no" when asked about attentional difficulties, highlighting that these seem to be day or situation dependant.  Reduced processing speed: Denied. Difficulties with executive functions: Denied. Her husband noted that she experiences greater indecision and  trouble with decision making. He also noted personality changes in that she is "not engaging" and that her "bubbly personality has not been there." Difficulties with emotion regulation: Denied. Difficulties with receptive language: Denied. Difficulties with word finding: Endorsed "occasionally." Decreased visuoperceptual ability: Denied.  Trajectory of deficits: As stated above, some concern surrounding memory decline has been noted for the past 6-7 years. There was also expressed concern surrounding more recent cognitive deterioration. Per her husband, Marie Ramos has exhibited gradual improvement following her May 2024 hospitalization. He attributed much of this to more appropriate medication management and the reduction or elimination of prior medications.   Difficulties completing ADLs: Her husband has fully taken over medication management, financial management, and bill paying responsibilities. This has been present at least since her May 2024 hospitalization. He alluded to his management of finances for a longer duration of time due to Marie Ramos's history of bipolar disorder and financial mismanagement concerns during past manic episodes. Marie Ramos no longer drives. Prior to her hospitalization, medical records suggest instances where she got lost driving to known locations.   Additional Medical History: History of traumatic brain injury/concussion: Her husband alluded to  a fall and potential head injury sustained in 2010. He suggested some orthopedic injury surrounding her spleen but was unsure regarding head injury severity indicators. I was unable to locate any medical records surrounding this event. There was also concern surrounding her May 2024 hospitalization. However, I was not able to find documentation to formally suggest a concussion diagnosis. No more recent head injury concerns were noted.  History of stroke: See above.  History of seizure activity: Denied. History of known  exposure to toxins: Denied. Symptoms of chronic pain: Endorsed. She reported arthritic pain impacting her left knee.  Experience of frequent headaches/migraines: Denied. Frequent instances of dizziness/vertigo: Denied outside of when she may stand quickly.   Sensory changes: She wears glasses with benefit. Other sensory changes/difficulties (e.g., hearing, taste, smell) were denied.  Balance/coordination difficulties: Endorsed. Prior to her May 2024 hospitalization, there were concern for frequent falling behaviors. After her ED admission, medications were adjusted and, per her husband, falling behaviors have been greatly reduced. ED records do suggest concern that medication side effects may have been a primary culprit in falling behaviors. She ambulates with a walker currently and maneuvers this device well.  Other motor difficulties: Denied.  Sleep History: Estimated hours obtained each night: 7 hours.  Difficulties falling asleep: Denied. Difficulties staying asleep: Endorsed. Feels rested and refreshed upon awakening: Denied. Per her husband, some of this was attributed to medication side effects as fatigue can kick in in the morning following her taking her morning doses.   History of snoring: Endorsed. History of waking up gasping for air: Endorsed. Witnessed breath cessation while asleep: Endorsed. A prior sleep study suggested moderate obstructive sleep apnea per her husband. She does not use CPAP treatment and this condition appears untreated.   History of vivid dreaming: Denied. Excessive movement while asleep: Denied. Instances of acting out her dreams: Denied.  Psychiatric/Behavioral Health History: Depression: She has a history of bipolar I disorder with classic depressive episodes. This condition has been reportedly managed well over the past several years and especially since her may 2024 hospitalization. She described her current mood as "okay." Current or remote suicidal  ideation, intent, or plan was denied.  Anxiety: Denied. Mania: Endorsed. Trauma History: Denied. Visual/auditory hallucinations: Denied. Delusional thoughts: Denied.  Tobacco: Denied. Alcohol: She denied current alcohol consumption as well as a history of problematic alcohol abuse or dependence.  Recreational drugs: Denied.  Family History: Problem Relation Age of Onset   Cancer Mother    Depression Mother    CAD Mother    Alzheimer's disease Mother    COPD Father    Depression Father    Diabetes Maternal Grandfather    Depression Maternal Grandfather    Alzheimer's disease Maternal Grandfather    Arthritis/Rheumatoid Paternal Grandmother    This information was confirmed by Marie Ramos.  Academic/Vocational History: Highest level of educational attainment: 18 years. She earned her MBA and described herself as a good (A/B) student in academic settings. Math was noted as a relative weakness in earlier academic settings.  History of developmental delay: Denied. History of grade repetition: Denied. Enrollment in special education courses: Denied. History of LD/ADHD: Denied.  Employment: Retired. 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   Evaluation Results:   Behavioral Observations: Marie Ramos was accompanied by her husband, arrived to her appointment on time, and was appropriately dressed and groomed. She appeared alert. She ambulated  with the assistance of a walker and maneuvered this device well. Gross motor functioning appeared intact upon informal observation and no abnormal movements (e.g., tremors) were noted. Her affect was generally relaxed and positive. Spontaneous speech was fluent and word finding difficulties were not observed during the clinical interview. Thought processes were tangential at times. There were several instances during interview where her  response was unrelated to the initial question asked of her, causing her husband to provide a more directly relevant response. Thought processes were normal in content. Insight into her cognitive difficulties appeared limited and I do question if she has full appreciation for the extent of ongoing cognitive dysfunction.   During testing, she progressed through tasks in an extremely slow manner. She commonly required multiple prompts to provide a response and exhibited significant indecision. Sustained attention was appropriate. Task engagement was adequate and she persisted when challenged. Overall, Ms. Ades was cooperative with the clinical interview and subsequent testing procedures.   Adequacy of Effort: The validity of neuropsychological testing is limited by the extent to which the individual being tested may be assumed to have exerted adequate effort during testing. Marie Ramos expressed her intention to perform to the best of her abilities and exhibited adequate task engagement and persistence. Scores across stand-alone performance validity measures were below expectation and relatively far from appropriate clinical cut-offs. I am unaware of any known secondary gain which could be achieved with poor performance and personally believe that Marie Ramos was engaged throughout the testing process. However, given validity task performances, current testing should be interpreted with caution as obtained scores may underestimate true abilities to an unknown degree.  Test Results: Ms. Byington was mildly disoriented at the time of the current evaluation. She incorrectly stated her age ("81") and was one day off when stating the current date.  Intellectual abilities based upon educational and vocational attainment were estimated to be in the average to above average range. Premorbid abilities were estimated to be within the average range based upon a single-word reading test. Her full scale IQ calculation  was in the well below average range.    Processing speed was largely exceptionally low to well below average. She did perform in the average range across a simple counting task. Basic attention was average. More complex attention (e.g., working memory) was below average. Executive functioning was variable but overall below expectation, ranging from the exceptionally low to below average normative ranges.  Assessed receptive language abilities were below average. Likewise, Ms. Thatch was tangential during interview and, at times, provided answers to questions which were unrelated to what was initially asked of her. She did not appear to exhibit prominent difficulties comprehending task instructions during testing. Assessed expressive language was variable. Verbal fluency (i.e., both phonemic and semantic fluency) was exceptionally low while confrontation naming was average.     Assessed visuospatial/visuoconstructional abilities were variable but overall below expectation, ranging from the exceptionally low to below average normative ranges. Across her drawing of a clock, she included the numbers 12 through 6 in the right hemisphere but then placed numbers 5 through 1 in clockwise fashion within the left hemisphere. She also exhibited confusion and was unable to draw the clock hands. Points were lost on her copy of a complex figure due to numerous but generally mild visual distortions.    Learning (i.e., encoding) of novel verbal and visual information was variable, ranging from the well below average to average normative ranges. Spontaneous delayed recall (i.e., retrieval) of previously learned  information was also variable, ranging from the well below average to average normative ranges. Retention rates were 100% across a list learning task, 91% across a story learning task, 63% across a daily living task, and 100% across a shape learning task. Performance across recognition tasks was exceptionally low to  well below average, suggesting very limited evidence for information consolidation.   Results of emotional screening instruments suggested that recent symptoms of generalized anxiety were in the moderate range, while symptoms of depression were within normal limits. A screening instrument assessing recent sleep quality suggested the presence of minimal sleep dysfunction.  Tables of Scores:   Note: This summary of test scores accompanies the interpretive report and should not be considered in isolation without reference to the appropriate sections in the text. Descriptors are based on appropriate normative data and may be adjusted based on clinical judgment. Terms such as "Within Normal Limits" and "Outside Normal Limits" are used when a more specific description of the test score cannot be determined.       Percentile - Normative Descriptor > 98 - Exceptionally High 91-97 - Well Above Average 75-90 - Above Average 25-74 - Average 9-24 - Below Average 2-8 - Well Below Average < 2 - Exceptionally Low       Validity:   DESCRIPTOR       ACS WC: --- --- Outside Normal Limits  DCT: --- --- Outside Normal Limits       Orientation:      Raw Score Percentile   NAB Orientation, Form 1 26/29 --- ---       Cognitive Screening:      Raw Score Percentile   SLUMS: 17/30 --- ---       Intellectual Functioning:      Standard Score Percentile   Test of Premorbid Functioning: 95 37 Average       Wechsler Adult Intelligence Scale (WAIS-5):  Standard Score/ Scaled Score Percentile   Full Scale IQ  75 5 Well Below Average  General Abilities Index 77 6 Well Below Average    Similarities  4 2 Well Below Average    Vocabulary 8 25 Average    Block Design  7 16 Below Average    Matrix Reasoning 6 9 Below Average    Figure Weights 7 16 Below Average    Digit Sequencing 6 9 Below Average    Coding 4 2 Well Below Average       Memory:     NAB Memory Module, Form 1: Standard Score/ T Score  Percentile   Total Memory Index 72 3 Well Below Average  List Learning       Total Trials 1-3 15/36 (29) 2 Well Below Average    List B 3/12 (38) 12 Below Average    Short Delay Free Recall 4/12 (29) 2 Well Below Average    Long Delay Free Recall 4/12 (30) 2 Well Below Average    Retention Percentage 100 (51) 54 Average    Recognition Discriminability 0 (28) 2 Well Below Average  Shape Learning       Total Trials 1-3 10/27 (30) 2 Well Below Average    Delayed Recall 4/9 (36) 8 Well Below Average    Retention Percentage 100 (51) 54 Average    Recognition Discriminability 0 (<19) <1 Exceptionally Low  Story Learning       Immediate Recall 68/80 (52) 58 Average    Delayed Recall 31/40 (43) 25 Average    Retention Percentage 91 (51)  54 Average  Daily Living Memory       Immediate Recall 43/51 (47) 38 Average    Delayed Recall 10/17 (29) 2 Well Below Average    Retention Percentage 63 (33) 5 Well Below Average    Recognition Hits 7/10 (36) 8 Well Below Average       Attention/Executive Function:     Trail Making Test (TMT): Raw Score (T Score) Percentile     Part A 56 secs.,  0 errors (31) 3 Well Below Average    Part B 355 secs.,  1 error (<19) <1 Exceptionally Low         Scaled Score Percentile   WAIS-5 Coding: 4 2 Well Below Average  WAIS-5 Naming Speed Quantity: 8 25 Average        Scaled Score Percentile   WAIS-5 Digits Forwards: 10 50 Average  WAIS-5 Digit Sequencing: 6 9 Below Average        Scaled Score Percentile   WAIS-5 Similarities: 4 2 Well Below Average  WAIS-5 Matrix Reasoning: 6 9 Below Average  WAIS-5 Figure Weights: 7 16 Below Average       D-KEFS Verbal Fluency Test: Raw Score (Scaled Score) Percentile     Letter Total Correct 13 (3) 1 Exceptionally Low    Category Total Correct 14 (2) <1 Exceptionally Low    Category Switching Total Correct 9 (6) 9 Below Average    Category Switching Accuracy 8 (7) 16 Below Average      Total Set Loss Errors 0 (13)  84 Above Average      Total Repetition Errors 0 (13) 84 Above Average       Language:     Verbal Fluency Test: Raw Score (T Score) Percentile     Phonemic Fluency (FAS) 13 (19) <1 Exceptionally Low    Animal Fluency 7 (<19) <1 Exceptionally Low        NAB Language Module, Form 1: T Score Percentile     Auditory Comprehension 37 9 Below Average    Naming 31/31 (56) 73 Average       Visuospatial/Visuoconstruction:      Raw Score Percentile   Clock Drawing: 3/10 --- Impaired       NAB Spatial Module, Form 1: T Score Percentile     Figure Drawing Copy 34 5 Well Below Average        Scaled Score Percentile   WAIS-5 Block Design: 7 16 Below Average       Mood and Personality:      Raw Score Percentile   Beck Depression Inventory - II: 11 --- Within Normal Limits  PROMIS Anxiety Questionnaire: 21 --- Moderate       Additional Questionnaires:      Raw Score Percentile   PROMIS Sleep Disturbance Questionnaire: 17 --- None to Slight   Informed Consent and Coding/Compliance:   The current evaluation represents a clinical evaluation for the purposes previously outlined by the referral source and is in no way reflective of a forensic evaluation.   Ms. Giraldo was provided with a verbal description of the nature and purpose of the present neuropsychological evaluation. Also reviewed were the foreseeable risks and/or discomforts and benefits of the procedure, limits of confidentiality, and mandatory reporting requirements of this provider. The patient was given the opportunity to ask questions and receive answers about the evaluation. Oral consent to participate was provided by the patient.   This evaluation was conducted by Newman Nickels, Ph.D., ABPP-CN, board certified clinical  neuropsychologist. Ms. Flinchbaugh completed a clinical interview with Dr. Milbert Coulter, billed as one unit [phone], and 195 minutes of cognitive testing and scoring, billed as one unit [phone] and six additional units [id number].  Psychometrist Wallace Keller, B.S. assisted Dr. Milbert Coulter with test administration and scoring procedures. As a separate and discrete service, one unit [id number] and two units [phone] were billed for Dr. Tammy Sours time spent in interpretation and report writing.

## 2023-10-09 NOTE — Progress Notes (Signed)
   Psychometrician Note   Cognitive testing was administered to Marie Ramos by Marie Ramos, B.S. (psychometrist) under the supervision of Marie Ramos, Ph.D., ABPP, licensed psychologist on 10/09/2023. Marie Ramos did not appear overtly distressed by the testing session per behavioral observation or responses across self-report questionnaires. Rest breaks were offered.    The battery of tests administered was selected by Marie Ramos, Ph.D., ABPP with consideration to Marie Ramos's current level of functioning, the nature of her symptoms, emotional and behavioral responses during interview, level of literacy, observed level of motivation/effort, and the nature of the referral question. This battery was communicated to the psychometrist. Communication between Marie Ramos, Ph.D., ABPP and the psychometrist was ongoing throughout the evaluation and Marie Ramos, Ph.D., ABPP was immediately accessible at all times. Marie Ramos, Ph.D., ABPP provided supervision to the psychometrist on the date of this service to the extent necessary to assure the quality of all services provided.    Marie Ramos will return within approximately 1-2 weeks for an interactive feedback session with Marie Ramos at which time her test performances, clinical impressions, and treatment recommendations will be reviewed in detail. Marie Ramos understands she can contact our office should she require our assistance before this time.  A total of 195 minutes of billable time were spent face-to-face with Marie Ramos by the psychometrist. This includes both test administration and scoring time. Billing for these services is reflected in the clinical report generated by Marie Ramos, Ph.D., ABPP  This note reflects time spent with the psychometrician and does not include test scores or any clinical interpretations made by Marie Ramos. The full report will follow in a separate note.

## 2023-10-16 ENCOUNTER — Ambulatory Visit: Payer: Medicare HMO | Admitting: Psychology

## 2023-10-16 DIAGNOSIS — R4189 Other symptoms and signs involving cognitive functions and awareness: Secondary | ICD-10-CM | POA: Diagnosis not present

## 2023-10-16 DIAGNOSIS — I6340 Cerebral infarction due to embolism of unspecified cerebral artery: Secondary | ICD-10-CM | POA: Diagnosis not present

## 2023-10-16 NOTE — Progress Notes (Signed)
   Neuropsychology Feedback Session Marie Ramos. [facility] Department of Neurology  Reason for Referral:   Marie Ramos is a 68 y.o. right-handed Caucasian female referred by Marlowe Kays, PA-C, to characterize her current cognitive functioning and assist with diagnostic clarity and treatment planning in the context of subjective cognitive decline.   Feedback:   [name] completed a comprehensive neuropsychological evaluation on 10/09/2023. Please refer to that encounter for the full report and recommendations. Briefly, given validity task performances, current testing should be interpreted with caution as obtained scores may underestimate true abilities to an unknown degree. If taken at face value, [name]'s pattern of performance is suggestive of fairly diffuse cognitive impairment. Severe impairment was exhibited surrounding processing speed, executive functioning, verbal fluency, and visuospatial abilities. Further weakness/variability was exhibited across all aspects of learning and memory. Performances were appropriate relative to age-matched peers across attention/concentration, receptive language, and confrontation naming. Functionally, her husband took over medication and financial management after her hospitalization in May 2024. While he has not returned these to her at the present time, he did express optimism that he could at some point in the future. She does not drive and there were concerns surrounding her getting lost prior to her hospitalization. Per medical records, her daughter has expressed concern for cognitive decline for the past 6-7 years, potentially starting around the time of her cardiac arrest and stroke. Current testing patterns could reflect a primary vascular etiology given her prior embolic stroke and potential watershed ischemia, as well as numerous chronic medical ailments which negatively impact the heart and brain. This as the primary  etiology would appear most likely in my opinion. Despite memory weaknesses, she does not exhibit a classic Alzheimer's disease profile at the present time.  [name] was accompanied by her husband during the current feedback session. Content of the current session focused on the results of her neuropsychological evaluation. [name] was given the opportunity to ask questions and her questions were answered. She was encouraged to reach out should additional questions arise. A copy of her report was provided at the conclusion of the visit.      One unit [phone] was billed for Dr. Tammy Sours time spent preparing for, conducting, and documenting the current feedback session with [name].

## 2023-10-19 ENCOUNTER — Ambulatory Visit: Payer: Medicare HMO | Admitting: Physician Assistant

## 2023-10-19 ENCOUNTER — Encounter: Payer: Self-pay | Admitting: Physician Assistant

## 2023-10-19 DIAGNOSIS — F319 Bipolar disorder, unspecified: Secondary | ICD-10-CM | POA: Diagnosis not present

## 2023-10-19 DIAGNOSIS — F411 Generalized anxiety disorder: Secondary | ICD-10-CM | POA: Diagnosis not present

## 2023-10-19 DIAGNOSIS — G4733 Obstructive sleep apnea (adult) (pediatric): Secondary | ICD-10-CM

## 2023-10-19 NOTE — Progress Notes (Signed)
 [facility]  Patient ID: GISSELE NARDUCCI,  MRN: [id number]  PCP: Jimmey Mould, MD  Date of Evaluation: 10/19/2023 Time spent:30 minutes  Chief Complaint:  Chief Complaint   Follow-up    HISTORY/CURRENT STATUS: For medication follow-up.  Husband, Georgette Kins, and granddaughter Alisa App are with her.  Overall Alois Arnt states she is doing well.  [name] agrees.  She has felt more like cooking and doing chores around the house at times.  Georgette Kins states she is doing it on her own, he does not have to ask her or remind her to do things.  This is an improvement over the past couple of years.  Her main issue right now is not wanting to call people or be engaged with people.  That is not like her however she has been that way for months now.  It is not worse.  She does not feel depressed.  Energy and motivation are stable for her.  Personal hygiene is normal.  Appetite is normal and weight is stable.  No panic attacks or anxiety reported.  No suicidal or homicidal thoughts.  Patient denies increased energy with decreased need for sleep, increased talkativeness, racing thoughts, impulsivity or risky behaviors, increased spending, increased libido, grandiosity, increased irritability or anger, paranoia, or hallucinations.  Walks with a walker.  Denies dizziness, syncope, seizures, numbness, tingling, tremor, tics, slurred speech, confusion. Denies muscle or joint pain, stiffness, or dystonia. Denies unexplained weight loss, frequent infections, or sores that heal slowly.  No polyphagia, polydipsia, or polyuria. Denies visual changes or paresthesias.   Individual Medical History/ Review of Systems: Changes? :Yes    neuropsychological testing done since last visit.  See notes on chart.  Saw cardiology 09/02/2023.  Will be seeing nephrology sometime soon.  Has a neurology follow-up next month.  Has sleep apnea.  Was not able to tolerate CPAP.  Past medications for mental health diagnoses include: Xanax,  lithium caused diarrhea and increased thirst, Trileptal, Effexor XR, Seroquel worked well, Abilify unknown what happened, Adderall, Risperdal helped mania but caused bilateral lower extremity edema and dry mouth.  Allergies: Sulfa antibiotics, Albuterol, Other, Sulfamethoxazole-trimethoprim, Vicodin hp [hydrocodone-acetaminophen], Naproxen, and Vicodin [hydrocodone-acetaminophen]  Current Medications:  Current Outpatient Medications:    Acetylcysteine (NAC) 600 MG CAPS, Take 1 capsule (600 mg total) by mouth daily., Disp: 30 capsule, Rfl: 11   amLODipine (NORVASC) 5 MG tablet, Take 1 tablet (5 mg total) by mouth daily with breakfast., Disp: 30 tablet, Rfl: 0   Ascorbic Acid (VITAMIN C PO), Take 1 capsule by mouth daily., Disp: , Rfl:    aspirin EC 81 MG tablet, Take 1 tablet (81 mg total) by mouth daily. Swallow whole., Disp: 90 tablet, Rfl: 3   atorvastatin (LIPITOR) 20 MG tablet, Take 80 mg by mouth daily., Disp: , Rfl:    busPIRone (BUSPAR) 5 MG tablet, Take 1 tablet (5 mg total) by mouth 2 (two) times daily., Disp: 180 tablet, Rfl: 3   carvedilol (COREG) 25 MG tablet, Take 25 mg by mouth 2 (two) times daily., Disp: , Rfl:    cholecalciferol (VITAMIN D3) 25 MCG (1000 UNIT) tablet, Take 2,000 Units by mouth daily., Disp: , Rfl:    empagliflozin (JARDIANCE) 10 MG TABS tablet, Take 1 tablet (10 mg total) by mouth daily., Disp: 30 tablet, Rfl: 11   ezetimibe (ZETIA) 10 MG tablet, Take 1 tablet (10 mg total) by mouth daily., Disp: 90 tablet, Rfl: 3   famotidine (PEPCID) 20 MG tablet, Take 20 mg by mouth  at bedtime., Disp: , Rfl:    ferrous sulfate 325 (65 FE) MG EC tablet, Take 325 mg by mouth daily with breakfast., Disp: , Rfl:    fexofenadine (ALLEGRA) 180 MG tablet, Take 180 mg by mouth daily., Disp: , Rfl:    fluticasone (FLONASE) 50 MCG/ACT nasal spray, Place 1 spray into both nostrils daily. Use as directed, Disp: , Rfl:    furosemide (LASIX) 40 MG tablet, Take 1 tablet (40 mg total) by mouth  daily., Disp: 90 tablet, Rfl: 3   losartan (COZAAR) 25 MG tablet, Take 0.5 tablets (12.5 mg total) by mouth at bedtime., Disp: 45 tablet, Rfl: 3   memantine (NAMENDA) 5 MG tablet, Take 1 tablet (5 mg total) by mouth at bedtime., Disp: 90 tablet, Rfl: 1   Multiple Vitamins-Minerals (OCUVITE PO), Take 1 capsule by mouth daily., Disp: , Rfl:    Oxcarbazepine (TRILEPTAL) 300 MG tablet, 1/2 po q am, 1 po at bedtime., Disp: 45 tablet, Rfl: 11   polyethylene glycol (MIRALAX / GLYCOLAX) 17 g packet, Take 17 g by mouth daily. (Patient taking differently: Take 17 g by mouth as needed.), Disp: 14 each, Rfl: 0   triamcinolone ointment (KENALOG) 0.5 %, Apply 1 application topically as needed., Disp: , Rfl: 0   venlafaxine XR (EFFEXOR-XR) 75 MG 24 hr capsule, Take 1 capsule (75 mg total) by mouth 2 (two) times daily., Disp: 60 capsule, Rfl: 11   Multiple Vitamin (MULTIVITAMIN WITH MINERALS) TABS tablet, Take 1 tablet by mouth daily. (Patient not taking: Reported on 09/02/2023), Disp: , Rfl:  Medication Side Effects: none  Family Medical/ Social History: Changes?  Her BIL died last month  MENTAL HEALTH EXAM:  There were no vitals taken for this visit.There is no height or weight on file to calculate BMI.  General Appearance: Casual, Well Groomed, and Obese  Eye Contact:  Good  Speech:  Clear and Coherent and Normal Rate  Volume:  Normal  Mood:  Euthymic  Affect:  Congruent       Thought Process:  Goal Directed and Descriptions of Associations: Circumstantial  Orientation:  Full (Time, Place, and Person)  Thought Content: Logical   Suicidal Thoughts:  No  Homicidal Thoughts:  No  Memory:  Immediate;   Fair Recent;   Fair Remote;   Good  Judgement:  Good  Insight:  Good  Psychomotor Activity:   using a walker  Concentration:  Concentration: Good and Attention Span: Good  Recall:  Good  Fund of Knowledge: Good  Language: Good  Assets:  Desire for Improvement Financial  Resources/Insurance Housing Resilience Social Support Transportation  ADL's:  Intact  Cognition: WNL  Prognosis:  Good   Had neuropsychological testing for 10/09/2023  DIAGNOSES:  No diagnosis found.  Receiving Psychotherapy: No   RECOMMENDATIONS:  PDMP reviewed. Last Xanax filled 10/24/2022. I provided 30 minutes of face to face time during this encounter, including time spent before and after the visit in records review, medical decision making, counseling pertinent to today's visit, and charting.   From a mental health medication standpoint she is stable so no changes will be made.  Encouraged her to use CPAP.  Gala Romney states she will not.  She is considering an oral appliance to help with sleep apnea.  Continue Buspar 5 mg, 1 po bid.  Continue Namenda 5 mg at bedtime, per neurology. Continue oxcarbazepine 300 mg to 1/2 pill p.o. every morning and 1 p.o. nightly. Continue  Effexor XR 75 mg, 1 po bid.  Continue vitamins as per med list.   Return in 3 months.  Avoid drugs that are known to cause prolonged QT interval  Marvia Slocumb, PA-C

## 2023-10-30 DIAGNOSIS — H02423 Myogenic ptosis of bilateral eyelids: Secondary | ICD-10-CM | POA: Diagnosis not present

## 2023-10-30 DIAGNOSIS — H16223 Keratoconjunctivitis sicca, not specified as Sjogren's, bilateral: Secondary | ICD-10-CM | POA: Diagnosis not present

## 2023-10-30 DIAGNOSIS — H353132 Nonexudative age-related macular degeneration, bilateral, intermediate dry stage: Secondary | ICD-10-CM | POA: Diagnosis not present

## 2023-10-30 DIAGNOSIS — H2513 Age-related nuclear cataract, bilateral: Secondary | ICD-10-CM | POA: Diagnosis not present

## 2023-11-02 ENCOUNTER — Ambulatory Visit: Attending: Cardiology

## 2023-11-02 DIAGNOSIS — I5032 Chronic diastolic (congestive) heart failure: Secondary | ICD-10-CM | POA: Diagnosis not present

## 2023-11-02 DIAGNOSIS — Z95810 Presence of automatic (implantable) cardiac defibrillator: Secondary | ICD-10-CM

## 2023-11-05 DIAGNOSIS — R899 Unspecified abnormal finding in specimens from other organs, systems and tissues: Secondary | ICD-10-CM | POA: Diagnosis not present

## 2023-11-05 DIAGNOSIS — R160 Hepatomegaly, not elsewhere classified: Secondary | ICD-10-CM | POA: Diagnosis not present

## 2023-11-05 DIAGNOSIS — K760 Fatty (change of) liver, not elsewhere classified: Secondary | ICD-10-CM | POA: Diagnosis not present

## 2023-11-06 NOTE — Progress Notes (Signed)
 EPIC Encounter for ICM Monitoring  Patient Name: Marie Ramos is a 68 y.o. female Date: 11/06/2023 Primary Care Physican: Jimmey Mould, MD Primary Cardiologist: Lorie Rook Electrophysiologist: Camnitz Highest weight: 268 lbs 03/02/2023 Weight: 234.5 lbs 03/31/2023 Weight: 236.2 lbs 04/06/2023 Weight: 234 lbs 06/09/2023 Weight: 220 lbs 08/26/2023 Weight: 210 lbs 09/02/2023 Office Weight: 210 lbs 10/02/2023 Weight: 208 lbs   Time in AT/AF  0.0 hr/day (0.0%)          Transmission results reviewed.               Diet:  Daughter helps with diet when needed and encourages low salt.         Optivol thoracic impedance suggesting normal fluid levels within the last month.   Prescribed:  Furosemide  40 mg Take 1 tablet (40 mg total) by mouth daily.      Jardiance  10 mg take 1 tablet by mouth daily.   Labs: 04/20/2023 Creatinine 1.67, BUN 39, Potassium 4.8, Sodium 142, GFR 33 04/06/2023 Creatinine 1.96, BUN 41, Potassium 4.8, Sodium 146, GFR 28 01/07/2023 Creatinine 1.83, BUN 52, Potassium 5.2, Sodium 141, GFR 30 A complete set of results can be found in Results Review.   Recommendations:  No changes.     Follow-up plan: ICM clinic phone appointment on 12/07/2023.  91 day device clinic remote transmission 11/12/2023.      EP/Cardiology Office Visits:  Recall 02/29/2024 with Lovette Rud, PA.   Recall 07/23/2024 with Dr Lawana Pray.     Copy of ICM check sent to Dr. Lawana Pray.  3 month ICM trend: 4/287/2025.    12-14 Month ICM trend:     Marie Jain, RN 11/06/2023 2:42 PM

## 2023-11-11 ENCOUNTER — Telehealth: Payer: Self-pay

## 2023-11-11 ENCOUNTER — Ambulatory Visit: Payer: Medicare HMO | Admitting: Physician Assistant

## 2023-11-11 ENCOUNTER — Encounter: Payer: Self-pay | Admitting: Physician Assistant

## 2023-11-11 VITALS — BP 87/55 | HR 63 | Resp 20 | Ht 62.0 in

## 2023-11-11 DIAGNOSIS — R4189 Other symptoms and signs involving cognitive functions and awareness: Secondary | ICD-10-CM | POA: Diagnosis not present

## 2023-11-11 MED ORDER — MEMANTINE HCL 5 MG PO TABS: ORAL_TABLET | ORAL | 3 refills | Status: AC

## 2023-11-11 NOTE — Progress Notes (Signed)
 Assessment/Plan:    Mild Cognitive impairment, likely of vascular etiology  Marie Ramos is a very pleasant 68 y.o. RH female with a history off stage IIIa CKD, prior history of cardiac arrest with anoxic brain injury 2017 status post ICD, prior history of CVA 2017, history of DVT 2018, history of bipolar disorder followed by psychiatry, recent mechanical fall May 2024, recent acute on chronic diastolic CHF with acute hypoxic respiratory failure May 2024, anemia  and a diagnosis of MCI likely due to vascular etiology, presenting today in follow-up for evaluation of memory loss. Patient is on memantine  5 mg at night, tolerating well. Memory is stable. Mood is good.      Recommendations:   Follow up in 6  months. Repeat neuropsych evaluation in 12 to 18 months for diagnostic clarity and disease trajectory Continue memantine  5 mg nightly, side effects discussed  Recommend the use CPAP machine for OSA, and oxygen as needed Recommend good control of cardiovascular risk factors, patient informed of low BP today  Continue to control mood as per [facility] Continue iron supplements anemia    Subjective:   This patient is accompanied in the office by her husband  who supplements the history. Previous records as well as any outside records available were reviewed prior to todays visit.   Patient was last seen on 03/30/2023, last MoCA was 18/30 on May 2024     Any changes in memory since last visit? "It may better"-she says . Patient has some difficulty remembering recent conversations and names of people. LTM is good repeats oneself?  Endorsed Disoriented when walking into a room?  Patient denies    Misplacing objects?  Patient denies   Wandering behavior?   Denies.   Any personality changes since last visit?   Denies.   Any worsening depression?: denies.   Hallucinations or paranoia?  Denies.   Seizures?   Denies.    Any sleep changes? Sleeps well. Denies vivid dreams, REM behavior or  sleepwalking   Sleep apnea? Endorsed, she does not want to use the CPAP Any hygiene concerns?   denies   Independent of bathing and dressing?  Endorsed  Does the patient needs help with medications? Husband is in charge   Who is in charge of the finances? Husband is in charge     Any changes in appetite? She is more conscious about the foods that she eats   Patient have trouble swallowing?  denies   Does the patient cook?  Yes. Any kitchen accidents such as leaving the stove on?   Denies.   Any headaches?    Denies.   Vision changes? Denies. Chronic pain?  Denies.   Ambulates with difficulty?  Uses a walker for stability Recent falls or head injuries?    Denies.      Unilateral weakness, numbness or tingling?   Denies.   Any tremors?  Denies.   Any anosmia?    Denies.   Any incontinence of urine? Urge incontinence.  Any bowel dysfunction? Constipation due to iron Patient lives with husband     Does the patient drive? No longer drives   Neuropsych evaluation 10/09/2023  Briefly, given validity task performances, current testing should be interpreted with caution as obtained scores may underestimate true abilities to an unknown degree. If taken at face value, Ms. Manzi's pattern of performance is suggestive of fairly diffuse cognitive impairment. Severe impairment was exhibited surrounding processing speed, executive functioning, verbal fluency, and visuospatial abilities. Further weakness/variability was  exhibited across all aspects of learning and memory. Performances were appropriate relative to age-matched peers across attention/concentration, receptive language, and confrontation naming. Functionally, her husband took over medication and financial management after her hospitalization in May 2024. While he has not returned these to her at the present time, he did express optimism that he could at some point in the future. She does not drive and there were concerns surrounding her getting  lost prior to her hospitalization. Per medical records, her daughter has expressed concern for cognitive decline for the past 6-7 years, potentially starting around the time of her cardiac arrest and stroke. Current testing patterns could reflect a primary vascular etiology given her prior embolic stroke and potential watershed ischemia, as well as numerous chronic medical ailments which negatively impact the heart and brain. This as the primary etiology would appear most likely in my opinion. Despite memory weaknesses, she does not exhibit a classic Alzheimer's disease profile at the present time.     Initial visit 11/2022     How long did patient have memory difficulties?  "For the last 6-7 years, worse over the last 2 months ".  Daughter reports that the patient has difficulty recalling recent events and to a certain extent long-term "not knowing that she has a history of CKD and CHF when she was recently hospitalized".  "When she turned 60 we made a big party and she does not remember anything about it    Repeats oneself?  Endorsed.    Disoriented when walking into a room?  Patient denies   Leaving objects in unusual places? denies   Wandering behavior?  Patient was discharged today from [facility] but in the past, she was paranoid, packing her clothes, because she felt that they were taking pictures of her.  Any personality changes?  Patient denies  More paranoid than before Any history of depression?:  Yes "she is manic depressive".  Follows with psychiatry Hallucinations ?  Patient denies   Seizures?   Patient denies    Any sleep changes?  Denies vivid dreams, REM behavior or sleepwalking   Sleep apnea?  Possibly, for a sleep study soon. She did not like the CPAP in the hospital.  Any hygiene concerns? She needs assistance.  Independent of bathing and dressing? Needs assistance.  Does the patient needs help with medications? Husband  is in charge   Who is in charge of the finances?  Husband is in charge     Any changes in appetite? Denies     Patient have trouble swallowing? denies   Does the patient cook? Yes, with supervision.     Any kitchen accidents such as leaving the stove on? denies   Any headaches?   denies   Chronic  pain ?  She has arthritis knees and hands . Had CTS in the past  Recent falls or head injuries?  She has a history of debility and frequent falls, unsteady gait  requiring PT and OT and during the last hospitalization in May 2024 for a mechanical fall she had CIR.  The falls were believed to be secondary to high-dose ox carbamazepine, as they improved with lower dose. Uses a walker for stability . She is going to have Home PT.  Vision changes? denies   Unilateral weakness, numbness or tingling? denies   Any tremors?   denies   Any anosmia?  denies   Any incontinence of urine?  "She cannot make it because of mobility, not due to incontinence "  Any bowel dysfunction? denies      Patient lives on rehab, but she is being discharged today.  Unfortunately, the patient needs placement at ALF but there are some insurance issues that he and her dad.-Is to have a discussion with social work at his [facility] to try to go ahead with the placement as she needs 24/7.  History of heavy alcohol  intake? denies   History of heavy tobacco use? denies   Family history of dementia? Mother and MGF had AD  Does patient drive?No longer drives. 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          Past Medical History:  Diagnosis Date   Abnormality of gait    Acute lower UTI    Acute metabolic encephalopathy 08/16/2019   Acute on chronic diastolic CHF (congestive heart failure) 11/24/2022   Acute respiratory failure with hypoxia    AICD (automatic cardioverter/defibrillator) present 12/05/2015   AKI (acute kidney injury)    Benign essential hypertension    Bipolar I disorder in remission    Cardiac arrest 08/24/2015    concern for hypoxic encephalopathic presentation   Cerebrovascular accident (CVA) due to embolism of cerebral artery 08/2015   Scattered supra and infratentorial foci of acute ischemia measuring up to 11 mm, which may represent embolic phenomena, possible component of watershed ischemia   Chronic kidney disease (CKD)    Chronically dry eyes    Class 3 obesity 11/29/2021   Closed fracture of left distal radius 12/01/2018   Concussion 11/2022   DVT (deep venous thrombosis) 12/05/2015   Dyspnea    r/t seasonal allergies   E. coli UTI 09/05/2015   HLD (hyperlipidemia) 12/05/2015   Hypertrophic obstructive cardiomyopathy 08/29/2015   Hypokalemia 08/29/2015   Leukocytosis    Macular degeneration    Notalgia    Obstructive sleep apnea    Pneumonia due to COVID-19 virus 08/16/2019   Pulmonary vascular congestion 08/16/2019   QT prolongation 08/29/2015   Systolic dysfunction with acute on chronic heart failure    Thrombocytosis    Ventricular fibrillation 09/03/2015   MDT ICD Dr. Nunzio Belch     Past Surgical History:  Procedure Laterality Date   ABDOMINAL HYSTERECTOMY     CARDIAC CATHETERIZATION N/A 08/24/2015   Procedure: Left Heart Cath and Coronary Angiography;  Surgeon: Arnoldo Lapping, MD;  Location: [facility] INVASIVE CV LAB;  Service: Cardiovascular;  Laterality: N/A;   EP IMPLANTABLE DEVICE N/A 09/03/2015   MDT dual chamber ICD   OPEN REDUCTION INTERNAL FIXATION (ORIF) DISTAL RADIAL FRACTURE Left 12/01/2018   Procedure: OPEN REDUCTION INTERNAL FIXATION (ORIF) LEFT DISTAL RADIAL FRACTURE;  Surgeon: Micheline Ahr, MD;  Location: [facility] ORS;  Service: Orthopedics;  Laterality: Left;   SPLENECTOMY, TOTAL  2011   TONSILLECTOMY       PREVIOUS MEDICATIONS:   CURRENT MEDICATIONS:  Outpatient Encounter Medications as of 11/11/2023  Medication Sig   Acetylcysteine (NAC) 600 MG CAPS Take 1 capsule (600 mg total) by mouth daily.   amLODipine  (NORVASC ) 5 MG tablet Take 1 tablet (5 mg total) by mouth  daily with breakfast.   Ascorbic Acid  (VITAMIN C PO) Take 1 capsule by mouth daily.   aspirin  EC 81 MG tablet Take 1 tablet (81 mg total) by mouth daily. Swallow whole.   atorvastatin  (LIPITOR) 20 MG tablet Take 80 mg by mouth daily.   busPIRone  (BUSPAR ) 5 MG tablet Take 1 tablet (5 mg total) by mouth  2 (two) times daily.   carvedilol  (COREG ) 25 MG tablet Take 25 mg by mouth 2 (two) times daily.   cholecalciferol (VITAMIN D3) 25 MCG (1000 UNIT) tablet Take 2,000 Units by mouth daily.   empagliflozin  (JARDIANCE ) 10 MG TABS tablet Take 1 tablet (10 mg total) by mouth daily.   ezetimibe  (ZETIA ) 10 MG tablet Take 1 tablet (10 mg total) by mouth daily.   famotidine  (PEPCID ) 20 MG tablet Take 20 mg by mouth at bedtime.   ferrous sulfate 325 (65 FE) MG EC tablet Take 325 mg by mouth daily with breakfast.   fexofenadine (ALLEGRA) 180 MG tablet Take 180 mg by mouth daily.   fluticasone  (FLONASE ) 50 MCG/ACT nasal spray Place 1 spray into both nostrils daily. Use as directed   furosemide  (LASIX ) 40 MG tablet Take 1 tablet (40 mg total) by mouth daily.   losartan  (COZAAR ) 25 MG tablet Take 0.5 tablets (12.5 mg total) by mouth at bedtime.   Multiple Vitamin (MULTIVITAMIN WITH MINERALS) TABS tablet Take 1 tablet by mouth daily.   Multiple Vitamins-Minerals (OCUVITE PO) Take 1 capsule by mouth daily.   Oxcarbazepine  (TRILEPTAL ) 300 MG tablet 1/2 po q am, 1 po at bedtime.   polyethylene glycol (MIRALAX  / GLYCOLAX ) 17 g packet Take 17 g by mouth daily. (Patient taking differently: Take 17 g by mouth as needed.)   triamcinolone  ointment (KENALOG ) 0.5 % Apply 1 application topically as needed.   venlafaxine  XR (EFFEXOR -XR) 75 MG 24 hr capsule Take 1 capsule (75 mg total) by mouth 2 (two) times daily.   [DISCONTINUED] memantine  (NAMENDA ) 5 MG tablet Take 1 tablet (5 mg total) by mouth at bedtime.   [START ON 12/15/2023] memantine  (NAMENDA ) 5 MG tablet Take one tablet twice a day   No facility-administered  encounter medications on file as of 11/11/2023.     Objective:     PHYSICAL EXAMINATION:    VITALS:   Vitals:   11/11/23 0848  Height: 5\' 2"  (1.575 m)    GEN:  The patient appears stated age and is in NAD. HEENT:  Normocephalic, atraumatic.   Neurological examination:  General: NAD, well-groomed, appears stated age. Orientation: The patient is alert. Oriented to person, place and date Cranial nerves: There is good facial symmetry.The speech is fluent and clear. No aphasia or dysarthria. Fund of knowledge is appropriate. Recent memory impaired and remote memory is normal.  Attention and concentration are normal.  Able to name objects and repeat phrases.  Hearing is intact to conversational tone   Sensation: Sensation is intact to light touch throughout Motor: Strength is at least antigravity x4. DTR's 2/4 in UE/LE      12/26/2022   10:00 AM  Montreal Cognitive Assessment   Visuospatial/ Executive (0/5) 2  Naming (0/3) 3  Attention: Read list of digits (0/2) 2  Attention: Read list of letters (0/1) 1  Attention: Serial 7 subtraction starting at 100 (0/3) 0  Language: Repeat phrase (0/2) 1  Language : Fluency (0/1) 0  Abstraction (0/2) 1  Delayed Recall (0/5) 2  Orientation (0/6) 6  Total 18  Adjusted Score (based on education) 18        No data to display             Movement examination: Tone: There is normal tone in the UE/LE Abnormal movements:  no tremor.  No myoclonus.  No asterixis.   Coordination:  There is no decremation with RAM's. Normal finger to nose  Gait and Station:  The patient has no difficulty arising out of a deep-seated chair without the use of the hands. Uses a walker for stability. The patient's stride length is good.  Gait is cautious and narrow.   Thank you for allowing us  the opportunity to participate in the care of this nice patient. Please do not hesitate to contact us  for any questions or concerns.   Total time spent on today's visit  was 30 minutes dedicated to this patient today, preparing to see patient, examining the patient, ordering tests and/or medications and counseling the patient, documenting clinical information in the EHR or other health record, independently interpreting results and communicating results to the patient/family, discussing treatment and goals, answering patient's questions and coordinating care.  Cc:  Jimmey Mould, MD  Tex Filbert 11/11/2023 9:27 AM

## 2023-11-11 NOTE — Telephone Encounter (Signed)
 She's on such a low dose I never ordered a level. It's a good idea to check it with this change.  Which lab will she use, I'll order it.  Please mail the order to her.

## 2023-11-11 NOTE — Patient Instructions (Signed)
 It was a pleasure to see you today at our office.   Recommendations:  Recommend using the CPAP  Follow up in 6 months Continue memantine  5 mg twice a day    Counseling regarding caregiver distress, including caregiver depression, anxiety and issues regarding community resources, adult day care programs, adult living facilities, or memory care questions:  please contact your  Primary Doctor's Social Worker   Whom to call: Memory  decline, memory medications: Call our office [phone]   For psychiatric meds, mood meds: Please have your primary care physician manage these medications.  If you have any severe symptoms of a stroke, or other severe issues such as confusion,severe chills or fever, etc call 911 or go to the ER as you may need to be evaluated further    RECOMMENDATIONS FOR ALL PATIENTS WITH MEMORY PROBLEMS: 1. Continue to exercise (Recommend 30 minutes of walking everyday, or 3 hours every week) 2. Increase social interactions - continue going to [place] and enjoy social gatherings with friends and family 3. Eat healthy, avoid fried foods and eat more fruits and vegetables 4. Maintain adequate blood pressure, blood sugar, and blood cholesterol level. Reducing the risk of stroke and cardiovascular disease also helps promoting better memory. 5. Avoid stressful situations. Live a simple life and avoid aggravations. Organize your time and prepare for the next day in anticipation. 6. Sleep well, avoid any interruptions of sleep and avoid any distractions in the bedroom that may interfere with adequate sleep quality 7. Avoid sugar, avoid sweets as there is a strong link between excessive sugar intake, diabetes, and cognitive impairment We discussed the Mediterranean diet, which has been shown to help patients reduce the risk of progressive memory disorders and reduces cardiovascular risk. This includes eating fish, eat fruits and green leafy vegetables, nuts like almonds and hazelnuts,  walnuts, and also use olive oil. Avoid fast foods and fried foods as much as possible. Avoid sweets and sugar as sugar use has been linked to worsening of memory function.  There is always a concern of gradual progression of memory problems. If this is the case, then we may need to adjust level of care according to patient needs. Support, both to the patient and caregiver, should then be put into place.      You have been referred for a neuropsychological evaluation (i.e., evaluation of memory and thinking abilities). Please bring someone with you to this appointment if possible, as it is helpful for the doctor to hear from both you and another adult who knows you well. Please bring eyeglasses and hearing aids if you wear them.    The evaluation will take approximately 3 hours and has two parts:   The first part is a clinical interview with the neuropsychologist (Dr. Kitty Perkins or Dr. Annette Barters). During the interview, the neuropsychologist will speak with you and the individual you brought to the appointment.    The second part of the evaluation is testing with the doctor's technician Bernabe Brew or Burdette Carolin). During the testing, the technician will ask you to remember different types of material, solve problems, and answer some questionnaires. Your family member will not be present for this portion of the evaluation.   Please note: We must reserve several hours of the neuropsychologist's time and the psychometrician's time for your evaluation appointment. As such, there is a No-Show fee of $100. If you are unable to attend any of your appointments, please contact our office as soon as possible to reschedule.    FALL  PRECAUTIONS: Be cautious when walking. Scan the area for obstacles that may increase the risk of trips and falls. When getting up in the mornings, sit up at the edge of the bed for a few minutes before getting out of bed. Consider elevating the bed at the head end to avoid drop of blood pressure when  getting up. Walk always in a well-lit room (use night lights in the walls). Avoid area rugs or power cords from appliances in the middle of the walkways. Use a walker or a cane if necessary and consider physical therapy for balance exercise. Get your eyesight checked regularly.  FINANCIAL OVERSIGHT: Supervision, especially oversight when making financial decisions or transactions is also recommended.  HOME SAFETY: Consider the safety of the kitchen when operating appliances like stoves, microwave oven, and blender. Consider having supervision and share cooking responsibilities until no longer able to participate in those. Accidents with firearms and other hazards in the house should be identified and addressed as well.   ABILITY TO BE LEFT ALONE: If patient is unable to contact 911 operator, consider using LifeLine, or when the need is there, arrange for someone to stay with patients. Smoking is a fire hazard, consider supervision or cessation. Risk of wandering should be assessed by caregiver and if detected at any point, supervision and safe proof recommendations should be instituted.  MEDICATION SUPERVISION: Inability to self-administer medication needs to be constantly addressed. Implement a mechanism to ensure safe administration of the medications.       Mediterranean Diet A Mediterranean diet refers to food and lifestyle choices that are based on the traditions of countries located on the [employer]. This way of eating has been shown to help prevent certain conditions and improve outcomes for people who have chronic diseases, like kidney disease and heart disease. What are tips for following this plan? Lifestyle  Cook and eat meals together with your family, when possible. Drink enough fluid to keep your urine clear or pale yellow. Be physically active every day. This includes: Aerobic exercise like running or swimming. Leisure activities like gardening, walking, or  housework. Get 7-8 hours of sleep each night. If recommended by your health care provider, drink red wine in moderation. This means 1 glass a day for nonpregnant women and 2 glasses a day for men. A glass of wine equals 5 oz (150 mL). Reading food labels  Check the serving size of packaged foods. For foods such as rice and pasta, the serving size refers to the amount of cooked product, not dry. Check the total fat in packaged foods. Avoid foods that have saturated fat or trans fats. Check the ingredients list for added sugars, such as corn syrup. Shopping  At the grocery store, buy most of your food from the areas near the walls of the store. This includes: Fresh fruits and vegetables (produce). Grains, beans, nuts, and seeds. Some of these may be available in unpackaged forms or large amounts (in bulk). Fresh seafood. Poultry and eggs. Low-fat dairy products. Buy whole ingredients instead of prepackaged foods. Buy fresh fruits and vegetables in-season from local farmers markets. Buy frozen fruits and vegetables in resealable bags. If you do not have access to quality fresh seafood, buy precooked frozen shrimp or canned fish, such as tuna, salmon, or sardines. Buy small amounts of raw or cooked vegetables, salads, or olives from the deli or salad bar at your store. Stock your pantry so you always have certain foods on hand, such as olive oil,  canned tuna, canned tomatoes, rice, pasta, and beans. Cooking  Cook foods with extra-virgin olive oil instead of using butter or other vegetable oils. Have meat as a side dish, and have vegetables or grains as your main dish. This means having meat in small portions or adding small amounts of meat to foods like pasta or stew. Use beans or vegetables instead of meat in common dishes like chili or lasagna. Experiment with different cooking methods. Try roasting or broiling vegetables instead of steaming or sauteing them. Add frozen vegetables to soups,  stews, pasta, or rice. Add nuts or seeds for added healthy fat at each meal. You can add these to yogurt, salads, or vegetable dishes. Marinate fish or vegetables using olive oil, lemon juice, garlic, and fresh herbs. Meal planning  Plan to eat 1 vegetarian meal one day each week. Try to work up to 2 vegetarian meals, if possible. Eat seafood 2 or more times a week. Have healthy snacks readily available, such as: Vegetable sticks with hummus. Greek yogurt. Fruit and nut trail mix. Eat balanced meals throughout the week. This includes: Fruit: 2-3 servings a day Vegetables: 4-5 servings a day Low-fat dairy: 2 servings a day Fish, poultry, or lean meat: 1 serving a day Beans and legumes: 2 or more servings a week Nuts and seeds: 1-2 servings a day Whole grains: 6-8 servings a day Extra-virgin olive oil: 3-4 servings a day Limit red meat and sweets to only a few servings a month What are my food choices? Mediterranean diet Recommended Grains: Whole-grain pasta. Brown rice. Bulgar wheat. Polenta. Couscous. Whole-wheat bread. Dwyane Glad. Vegetables: Artichokes. Beets. Broccoli. Cabbage. Carrots. Eggplant. Green beans. Chard. Kale. Spinach. Onions. Leeks. Peas. Squash. Tomatoes. Peppers. Radishes. Fruits: Apples. Apricots. Avocado. Berries. Bananas. Cherries. Dates. Figs. Grapes. Lemons. Melon. Oranges. Peaches. Plums. Pomegranate. Meats and other protein foods: Beans. Almonds. Sunflower seeds. Pine nuts. Peanuts. Cod. Salmon. Scallops. Shrimp. Tuna. Tilapia. Clams. Oysters. Eggs. Dairy: Low-fat milk. Cheese. Greek yogurt. Beverages: Water. Red wine. Herbal tea. Fats and oils: Extra virgin olive oil. Avocado oil. Grape seed oil. Sweets and desserts: [place] yogurt with honey. Baked apples. Poached pears. Trail mix. Seasoning and other foods: Basil. Cilantro. Coriander. Cumin. Mint. Parsley. Sage. Rosemary. Tarragon. Garlic. Oregano. Thyme. Pepper. Balsalmic vinegar. Tahini. Hummus. Tomato  sauce. Olives. Mushrooms. Limit these Grains: Prepackaged pasta or rice dishes. Prepackaged cereal with added sugar. Vegetables: Deep fried potatoes (french fries). Fruits: Fruit canned in syrup. Meats and other protein foods: Beef. Pork. Lamb. Poultry with skin. Hot dogs. Helene Loader. Dairy: Ice cream. Sour cream. Whole milk. Beverages: Juice. Sugar-sweetened soft drinks. Beer. Liquor and spirits. Fats and oils: Butter. Canola oil. Vegetable oil. Beef fat (tallow). Lard. Sweets and desserts: Cookies. Cakes. Pies. Candy. Seasoning and other foods: Mayonnaise. Premade sauces and marinades. The items listed may not be a complete list. Talk with your dietitian about what dietary choices are right for you. Summary The Mediterranean diet includes both food and lifestyle choices. Eat a variety of fresh fruits and vegetables, beans, nuts, seeds, and whole grains. Limit the amount of red meat and sweets that you eat. Talk with your health care provider about whether it is safe for you to drink red wine in moderation. This means 1 glass a day for nonpregnant women and 2 glasses a day for men. A glass of wine equals 5 oz (150 mL). This information is not intended to replace advice given to you by your health care provider. Make sure you discuss any questions you  have with your health care provider. Document Released: 02/14/2016 Document Revised: 03/18/2016 Document Reviewed: 02/14/2016 Elsevier Interactive Patient Education  2017 [employer].      MRI at [facility]

## 2023-11-11 NOTE — Telephone Encounter (Addendum)
 [facility] called to report the manufacturer for oxcarbazepine   has changed to [employer]. Reporting that changing manufacturer can change blood level. Recommended checking level.   On glancing thru lab results available in Epic I don't see that a level has been done previously.

## 2023-11-12 ENCOUNTER — Ambulatory Visit (INDEPENDENT_AMBULATORY_CARE_PROVIDER_SITE_OTHER): Payer: Medicare HMO

## 2023-11-12 DIAGNOSIS — I469 Cardiac arrest, cause unspecified: Secondary | ICD-10-CM

## 2023-11-13 LAB — CUP PACEART REMOTE DEVICE CHECK
Battery Remaining Longevity: 22 mo
Battery Voltage: 2.92 V
Brady Statistic AP VP Percent: 0.01 %
Brady Statistic AP VS Percent: 4.83 %
Brady Statistic AS VP Percent: 0.07 %
Brady Statistic AS VS Percent: 95.09 %
Brady Statistic RA Percent Paced: 4.84 %
Brady Statistic RV Percent Paced: 0.08 %
Date Time Interrogation Session: 20250508063426
HighPow Impedance: 81 Ohm
Implantable Lead Connection Status: 753985
Implantable Lead Connection Status: 753985
Implantable Lead Implant Date: 20170227
Implantable Lead Implant Date: 20170227
Implantable Lead Location: 753859
Implantable Lead Location: 753860
Implantable Lead Model: 5076
Implantable Pulse Generator Implant Date: 20170227
Lead Channel Impedance Value: 342 Ohm
Lead Channel Impedance Value: 418 Ohm
Lead Channel Impedance Value: 418 Ohm
Lead Channel Pacing Threshold Amplitude: 0.625 V
Lead Channel Pacing Threshold Amplitude: 1 V
Lead Channel Pacing Threshold Pulse Width: 0.4 ms
Lead Channel Pacing Threshold Pulse Width: 0.4 ms
Lead Channel Sensing Intrinsic Amplitude: 18.875 mV
Lead Channel Sensing Intrinsic Amplitude: 18.875 mV
Lead Channel Sensing Intrinsic Amplitude: 2.5 mV
Lead Channel Sensing Intrinsic Amplitude: 2.5 mV
Lead Channel Setting Pacing Amplitude: 2 V
Lead Channel Setting Pacing Amplitude: 2.5 V
Lead Channel Setting Pacing Pulse Width: 0.4 ms
Lead Channel Setting Sensing Sensitivity: 0.3 mV
Zone Setting Status: 755011
Zone Setting Status: 755011

## 2023-11-19 ENCOUNTER — Other Ambulatory Visit: Payer: Self-pay | Admitting: Physician Assistant

## 2023-11-19 DIAGNOSIS — F319 Bipolar disorder, unspecified: Secondary | ICD-10-CM

## 2023-11-19 NOTE — Telephone Encounter (Signed)
 Pt wants to get labs done at PCP. Order sent to her.

## 2023-11-26 DIAGNOSIS — N183 Chronic kidney disease, stage 3 unspecified: Secondary | ICD-10-CM | POA: Diagnosis not present

## 2023-11-26 DIAGNOSIS — I509 Heart failure, unspecified: Secondary | ICD-10-CM | POA: Diagnosis not present

## 2023-11-26 DIAGNOSIS — I130 Hypertensive heart and chronic kidney disease with heart failure and stage 1 through stage 4 chronic kidney disease, or unspecified chronic kidney disease: Secondary | ICD-10-CM | POA: Diagnosis not present

## 2023-11-26 DIAGNOSIS — E669 Obesity, unspecified: Secondary | ICD-10-CM | POA: Diagnosis not present

## 2023-11-26 DIAGNOSIS — N179 Acute kidney failure, unspecified: Secondary | ICD-10-CM | POA: Diagnosis not present

## 2023-11-27 ENCOUNTER — Encounter: Payer: Self-pay | Admitting: Internal Medicine

## 2023-11-27 ENCOUNTER — Other Ambulatory Visit: Payer: Self-pay | Admitting: Internal Medicine

## 2023-11-27 DIAGNOSIS — I129 Hypertensive chronic kidney disease with stage 1 through stage 4 chronic kidney disease, or unspecified chronic kidney disease: Secondary | ICD-10-CM

## 2023-12-01 ENCOUNTER — Ambulatory Visit
Admission: RE | Admit: 2023-12-01 | Discharge: 2023-12-01 | Disposition: A | Discharge status: Home / Self Care | Source: Ambulatory Visit | Attending: Internal Medicine | Admitting: Internal Medicine

## 2023-12-01 DIAGNOSIS — I129 Hypertensive chronic kidney disease with stage 1 through stage 4 chronic kidney disease, or unspecified chronic kidney disease: Secondary | ICD-10-CM

## 2023-12-01 DIAGNOSIS — N200 Calculus of kidney: Secondary | ICD-10-CM | POA: Diagnosis not present

## 2023-12-01 DIAGNOSIS — N183 Chronic kidney disease, stage 3 unspecified: Secondary | ICD-10-CM | POA: Diagnosis not present

## 2023-12-07 ENCOUNTER — Ambulatory Visit: Attending: Cardiology

## 2023-12-07 DIAGNOSIS — I5032 Chronic diastolic (congestive) heart failure: Secondary | ICD-10-CM

## 2023-12-07 DIAGNOSIS — Z95810 Presence of automatic (implantable) cardiac defibrillator: Secondary | ICD-10-CM

## 2023-12-09 NOTE — Progress Notes (Signed)
 EPIC Encounter for ICM Monitoring  Patient Name: Marie Ramos is a 68 y.o. female Date: 12/09/2023 Primary Care Physican: Jimmey Mould, MD Primary Cardiologist: Lorie Rook Electrophysiologist: Lawana Pray Highest weight: 268 lbs 06/09/2023 Weight: 220 lbs 08/26/2023 Weight: 210 lbs 09/02/2023 Office Weight: 210 lbs 10/02/2023 Weight: 208 lbs 12/09/2023 Weight: 208-210 lbs   Time in AT/AF  0.0 hr/day (0.0%)          Spoke with daughter Ezzard Holms and heart failure questions reviewed.  Transmission results reviewed.  She reports pt asymptomatic for fluid accumulation and feeling well at this time.  She is getting out of the house and walking with walker.              Diet:  Daughter helps with diet when needed and encourages low salt.         Optivol thoracic impedance suggesting normal fluid levels within the last month.   Prescribed:  Furosemide  40 mg Take 1 tablet (40 mg total) by mouth daily.      Jardiance  10 mg take 1 tablet by mouth daily.   Labs: 04/20/2023 Creatinine 1.67, BUN 39, Potassium 4.8, Sodium 142, GFR 33 04/06/2023 Creatinine 1.96, BUN 41, Potassium 4.8, Sodium 146, GFR 28 01/07/2023 Creatinine 1.83, BUN 52, Potassium 5.2, Sodium 141, GFR 30 A complete set of results can be found in Results Review.   Recommendations:  No changes and encouraged to call if experiencing any fluid symptoms.   Follow-up plan: ICM clinic phone appointment on 02/01/2024.  91 day device clinic remote transmission 02/11/2024.      EP/Cardiology Office Visits:  Recall 02/29/2024 with Lovette Rud, PA.   Recall 07/23/2024 with Dr Lawana Pray.     Copy of ICM check sent to Dr. Lawana Pray.  3 month ICM trend: 12/07/2023.    12-14 Month ICM trend:     Almyra Jain, RN 12/09/2023 3:56 PM

## 2023-12-18 NOTE — Addendum Note (Signed)
 Addended by: Lott Rouleau A on: 12/18/2023 12:38 PM   Modules accepted: Orders

## 2023-12-18 NOTE — Progress Notes (Signed)
 Remote ICD transmission.

## 2024-01-11 ENCOUNTER — Other Ambulatory Visit: Payer: Self-pay | Admitting: Internal Medicine

## 2024-01-13 ENCOUNTER — Telehealth: Payer: Self-pay | Admitting: Internal Medicine

## 2024-01-13 MED ORDER — EZETIMIBE 10 MG PO TABS: 10.0000 mg | ORAL_TABLET | Freq: Every day | ORAL | 2 refills | Status: AC

## 2024-01-13 MED ORDER — EMPAGLIFLOZIN 10 MG PO TABS: 10.0000 mg | ORAL_TABLET | Freq: Every day | ORAL | 2 refills | Status: DC

## 2024-01-13 NOTE — Telephone Encounter (Signed)
*  STAT* If patient is at the pharmacy, call can be transferred to refill team.   1. Which medications need to be refilled? (please list name of each medication and dose if known)    empagliflozin  (JARDIANCE ) 10 MG TABS tablet    furosemide  (LASIX ) 40 MG tablet     4. Which pharmacy/location (including street and city if local pharmacy) is medication to be sent to? [id number] - [place], [place] - [address]     5. Do they need a 30 day or 90 day supply? 90

## 2024-01-13 NOTE — Telephone Encounter (Signed)
 Pt's medication was sent to pt's pharmacy as requested. Confirmation received.

## 2024-01-20 ENCOUNTER — Ambulatory Visit (INDEPENDENT_AMBULATORY_CARE_PROVIDER_SITE_OTHER): Admitting: Physician Assistant

## 2024-01-20 ENCOUNTER — Encounter: Payer: Self-pay | Admitting: Physician Assistant

## 2024-01-20 DIAGNOSIS — F411 Generalized anxiety disorder: Secondary | ICD-10-CM

## 2024-01-20 DIAGNOSIS — F319 Bipolar disorder, unspecified: Secondary | ICD-10-CM

## 2024-01-20 DIAGNOSIS — F99 Mental disorder, not otherwise specified: Secondary | ICD-10-CM

## 2024-01-20 DIAGNOSIS — F5105 Insomnia due to other mental disorder: Secondary | ICD-10-CM | POA: Diagnosis not present

## 2024-01-20 NOTE — Progress Notes (Signed)
 [facility] Check  Patient ID: Marie Ramos,  MRN: [id number]  PCP: Okey Carlin Redbird, MD  Date of Evaluation: 01/20/2024 Time spent:20 minutes  Chief Complaint:  Chief Complaint   Anxiety; Depression; Follow-up    HISTORY/CURRENT STATUS: For medication follow-up.  Husband, Georgette, is with her.  Doing well for the most part. Gets a little 'down' sometimes, anhedonia. 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  Doesn't feel like she's able.   No feelings of hopelessness.  Sleeps fairly well. Personal hygiene is normal.   ADLs are limited b/c physical and mental health reasons, but she does help [name] in the kitchen more. Denies any changes in concentration, making decisions, or remembering things.  She's lost some weight.  No SI/HI.  No reports of increased energy with decreased need for sleep, increased talkativeness, racing thoughts, impulsivity or risky behaviors, increased spending, grandiosity, increased irritability or anger, paranoia, or hallucinations.  Denies dizziness, syncope, seizures, numbness, tingling, tremor, tics, unsteady gait, slurred speech, confusion. Denies muscle or joint pain, stiffness, or dystonia.  Individual Medical History/ Review of Systems: Changes? :No      Past medications for mental health diagnoses include: Xanax , lithium caused diarrhea and increased thirst, Trileptal , Effexor  XR, Seroquel  worked well, Abilify unknown what happened, Adderall, Risperdal  helped mania but caused bilateral lower extremity edema and dry mouth.  Allergies: Sulfa antibiotics, Albuterol , Other, Sulfamethoxazole-trimethoprim, Vicodin hp [hydrocodone-acetaminophen ], Naproxen, and Vicodin [hydrocodone-acetaminophen ]  Current Medications:  Current Outpatient Medications:    Acetylcysteine (NAC) 600 MG CAPS, Take 1 capsule (600 mg total) by mouth daily., Disp: 30 capsule, Rfl: 11   amLODipine  (NORVASC ) 5 MG tablet, Take 1 tablet (5 mg total) by mouth daily with breakfast.,  Disp: 30 tablet, Rfl: 0   Ascorbic Acid  (VITAMIN C PO), Take 1 capsule by mouth daily., Disp: , Rfl:    aspirin  EC 81 MG tablet, Take 1 tablet (81 mg total) by mouth daily. Swallow whole., Disp: 90 tablet, Rfl: 3   atorvastatin  (LIPITOR) 20 MG tablet, Take 80 mg by mouth daily., Disp: , Rfl:    busPIRone  (BUSPAR ) 5 MG tablet, Take 1 tablet (5 mg total) by mouth 2 (two) times daily., Disp: 180 tablet, Rfl: 3   carvedilol  (COREG ) 25 MG tablet, Take 25 mg by mouth 2 (two) times daily., Disp: , Rfl:    cholecalciferol (VITAMIN D3) 25 MCG (1000 UNIT) tablet, Take 2,000 Units by mouth daily., Disp: , Rfl:    empagliflozin  (JARDIANCE ) 10 MG TABS tablet, Take 1 tablet (10 mg total) by mouth daily., Disp: 90 tablet, Rfl: 2   ezetimibe  (ZETIA ) 10 MG tablet, Take 1 tablet (10 mg total) by mouth daily., Disp: 90 tablet, Rfl: 2   famotidine  (PEPCID ) 20 MG tablet, Take 20 mg by mouth at bedtime., Disp: , Rfl:    ferrous sulfate 325 (65 FE) MG EC tablet, Take 325 mg by mouth daily with breakfast., Disp: , Rfl:    fexofenadine (ALLEGRA) 180 MG tablet, Take 180 mg by mouth daily., Disp: , Rfl:    fluticasone  (FLONASE ) 50 MCG/ACT nasal spray, Place 1 spray into both nostrils daily. Use as directed, Disp: , Rfl:    furosemide  (LASIX ) 40 MG tablet, Take 1 tablet (40 mg total) by mouth daily., Disp: 90 tablet, Rfl: 3   losartan  (COZAAR ) 25 MG tablet, Take 0.5 tablets (12.5 mg total) by mouth at bedtime., Disp: 45 tablet, Rfl: 3   memantine  (NAMENDA ) 5 MG tablet, Take one tablet twice a day, Disp: 180 tablet, Rfl:  3   Multiple Vitamin (MULTIVITAMIN WITH MINERALS) TABS tablet, Take 1 tablet by mouth daily., Disp: , Rfl:    Multiple Vitamins-Minerals (OCUVITE PO), Take 1 capsule by mouth daily., Disp: , Rfl:    Oxcarbazepine  (TRILEPTAL ) 300 MG tablet, 1/2 po q am, 1 po at bedtime., Disp: 45 tablet, Rfl: 11   polyethylene glycol (MIRALAX  / GLYCOLAX ) 17 g packet, Take 17 g by mouth daily., Disp: 14 each, Rfl: 0    triamcinolone  ointment (KENALOG ) 0.5 %, Apply 1 application topically as needed., Disp: , Rfl: 0   venlafaxine  XR (EFFEXOR -XR) 75 MG 24 hr capsule, Take 1 capsule (75 mg total) by mouth 2 (two) times daily., Disp: 60 capsule, Rfl: 11 Medication Side Effects: none  Family Medical/ Social History: Changes?  None  MENTAL HEALTH EXAM:  There were no vitals taken for this visit.There is no height or weight on file to calculate BMI.  General Appearance: Casual, Well Groomed, and Obese  Eye Contact:  Good  Speech:  Clear and Coherent and Normal Rate  Volume:  Normal  Mood:  Euthymic  Affect:  Congruent       Thought Process:  Goal Directed and Descriptions of Associations: Circumstantial  Orientation:  Full (Time, Place, and Person)  Thought Content: Logical   Suicidal Thoughts:  No  Homicidal Thoughts:  No  Memory:  Immediate;   Fair Recent;   Fair Remote;   Good  Judgement:  Good  Insight:  Good  Psychomotor Activity:  walks slowly using a rolader  Concentration:  Concentration: Good and Attention Span: Good  Recall:  Good  Fund of Knowledge: Good  Language: Good  Assets:  Desire for Improvement Financial Resources/Insurance Housing Resilience Social Support Transportation  ADL's:  Intact  Cognition: WNL  Prognosis:  Good   Had neuropsychological testing for 10/09/2023  DIAGNOSES:    ICD-10-CM   1. Bipolar I disorder (HCC)  F31.9     2. Generalized anxiety disorder  F41.1     3. Insomnia due to other mental disorder  F51.05    F99       Receiving Psychotherapy: No   RECOMMENDATIONS:  PDMP reviewed. Last Xanax  filled 10/24/2022. I provided approximately 20 minutes of face to face time during this encounter, including time spent before and after the visit in records review, medical decision making, counseling pertinent to today's visit, and charting.   She's very sensitive to med changes and even though she's somewhat depressed, we all prefer not to change anything at  this time. Of course if things change, they can call and we'll make adjustments.  Continue Buspar  5 mg, 1 po bid.  Continue Namenda  5 mg at bedtime, per neurology. Continue oxcarbazepine  300 mg to 1/2 pill p.o. every morning and 1 p.o. nightly. Continue  Effexor  XR 75 mg, 1 po bid.  Continue vitamins as per med list.   Return in 4 months.  Avoid drugs that are known to cause prolonged QT interval  Verneita Cooks, PA-C

## 2024-02-01 ENCOUNTER — Ambulatory Visit: Attending: Cardiology

## 2024-02-01 DIAGNOSIS — I5032 Chronic diastolic (congestive) heart failure: Secondary | ICD-10-CM | POA: Diagnosis not present

## 2024-02-01 DIAGNOSIS — Z95810 Presence of automatic (implantable) cardiac defibrillator: Secondary | ICD-10-CM

## 2024-02-03 MED ORDER — FUROSEMIDE 40 MG PO TABS: 40.0000 mg | ORAL_TABLET | Freq: Every day | ORAL | 3 refills | Status: AC

## 2024-02-03 NOTE — Progress Notes (Addendum)
 EPIC Encounter for ICM Monitoring  Patient Name: Marie Ramos is a 68 y.o. female Date: 02/03/2024 Primary Care Physican: Okey Carlin Redbird, MD Primary Cardiologist: Wendel Electrophysiologist: [name] Highest weight: 268 lbs 06/09/2023 Weight: 220 lbs 08/26/2023 Weight: 210 lbs 09/02/2023 Office Weight: 210 lbs 10/02/2023 Weight: 208 lbs 12/09/2023 Weight: 208-210 lbs   Time in AT/AF  0.0 hr/day (0.0%)          Spoke with daughter [name] and heart failure questions reviewed.  Transmission results reviewed.  She reports pt asymptomatic for fluid accumulation and feeling well at this time.   Daughter stated patient has been out of Furosemide  for at least a couple of weeks.  Advised the refill was approved by Dr Parry office 7/9 and note says Jardiance  and Furosemide  were both sent as refills to [facility] but Furosemide  script was not updated in EPIC and sent to [employer].                   Diet:  Daughter helps with diet when needed and encourages low salt.         Optivol thoracic impedance suggesting suggesting possible fluid accumulation starting 7/2 and trending back toward baseline.   Prescribed:  Furosemide  40 mg Take 1 tablet (40 mg total) by mouth daily.        Labs: 04/20/2023 Creatinine 1.67, BUN 39, Potassium 4.8, Sodium 142, GFR 33 04/06/2023 Creatinine 1.96, BUN 41, Potassium 4.8, Sodium 146, GFR 28 01/07/2023 Creatinine 1.83, BUN 52, Potassium 5.2, Sodium 141, GFR 30 A complete set of results can be found in Results Review.   Recommendations:  Furosemide  prescription updated and refill sent to [facility] as already approved on 7/9.   She will have patient resume Lasix  as soon as it is picked up from pharmacy.   Copy sent to Dr Wendel as RICK and review.    Follow-up plan: ICM clinic phone appointment on 02/11/2024 to recheck fluid levels.  91 day device clinic remote transmission 02/11/2024.      EP/Cardiology Office Visits:  Recall 02/29/2024 with [name], PA.    Recall 07/23/2024 with Dr [name].     Copy of ICM check sent to Dr. [name].  3 month ICM trend: 02/01/2024.    12-14 Month ICM trend:     [name], RN 02/03/2024 4:22 PM

## 2024-02-11 ENCOUNTER — Ambulatory Visit: Payer: Self-pay | Admitting: Cardiology

## 2024-02-11 ENCOUNTER — Ambulatory Visit: Payer: Medicare HMO

## 2024-02-11 DIAGNOSIS — I428 Other cardiomyopathies: Secondary | ICD-10-CM | POA: Diagnosis not present

## 2024-02-11 LAB — CUP PACEART REMOTE DEVICE CHECK
Battery Remaining Longevity: 20 mo
Battery Voltage: 2.92 V
Brady Statistic AP VP Percent: 0.01 %
Brady Statistic AP VS Percent: 4.48 %
Brady Statistic AS VP Percent: 0.08 %
Brady Statistic AS VS Percent: 95.43 %
Brady Statistic RA Percent Paced: 4.48 %
Brady Statistic RV Percent Paced: 0.09 %
Date Time Interrogation Session: 20250807022604
HighPow Impedance: 78 Ohm
Implantable Lead Connection Status: 753985
Implantable Lead Connection Status: 753985
Implantable Lead Implant Date: 20170227
Implantable Lead Implant Date: 20170227
Implantable Lead Location: 753859
Implantable Lead Location: 753860
Implantable Lead Model: 5076
Implantable Pulse Generator Implant Date: 20170227
Lead Channel Impedance Value: 361 Ohm
Lead Channel Impedance Value: 399 Ohm
Lead Channel Impedance Value: 456 Ohm
Lead Channel Pacing Threshold Amplitude: 0.625 V
Lead Channel Pacing Threshold Amplitude: 1.125 V
Lead Channel Pacing Threshold Pulse Width: 0.4 ms
Lead Channel Pacing Threshold Pulse Width: 0.4 ms
Lead Channel Sensing Intrinsic Amplitude: 19.5 mV
Lead Channel Sensing Intrinsic Amplitude: 19.5 mV
Lead Channel Sensing Intrinsic Amplitude: 2.625 mV
Lead Channel Sensing Intrinsic Amplitude: 2.625 mV
Lead Channel Setting Pacing Amplitude: 2 V
Lead Channel Setting Pacing Amplitude: 2.5 V
Lead Channel Setting Pacing Pulse Width: 0.4 ms
Lead Channel Setting Sensing Sensitivity: 0.3 mV
Zone Setting Status: 755011
Zone Setting Status: 755011

## 2024-02-12 ENCOUNTER — Ambulatory Visit: Attending: Cardiology

## 2024-02-12 DIAGNOSIS — I5032 Chronic diastolic (congestive) heart failure: Secondary | ICD-10-CM

## 2024-02-12 DIAGNOSIS — Z95810 Presence of automatic (implantable) cardiac defibrillator: Secondary | ICD-10-CM

## 2024-02-12 NOTE — Progress Notes (Signed)
 EPIC Encounter for ICM Monitoring  Patient Name: Marie Ramos is a 68 y.o. female Date: 02/12/2024 Primary Care Physican: Marie Carlin Redbird, MD Primary Cardiologist: Marie Ramos Electrophysiologist: Ramos Highest weight: 268 lbs 06/09/2023 Weight: 220 lbs 08/26/2023 Weight: 210 lbs 09/02/2023 Office Weight: 210 lbs 10/02/2023 Weight: 208 lbs 12/09/2023 Weight: 208-210 lbs   Time in AT/AF  0.0 hr/day (0.0%)          Spoke with daughter, Marie Ramos per DPR and heart failure questions reviewed.  Transmission results reviewed.  She reports patient is feeling well and taking Lasix  as prescribed since she received the new refill.              Diet:  Daughter helps with diet when needed and encourages low salt.         Optivol thoracic impedance suggesting fluid levels returned close to baseline after resuming Furosemide  as prescribed (was out of the prescription previously).   Prescribed:  Furosemide  40 mg Take 1 tablet (40 mg total) by mouth daily.        Labs: 04/20/2023 Creatinine 1.67, BUN 39, Potassium 4.8, Sodium 142, GFR 33 04/06/2023 Creatinine 1.96, BUN 41, Potassium 4.8, Sodium 146, GFR 28 01/07/2023 Creatinine 1.83, BUN 52, Potassium 5.2, Sodium 141, GFR 30 A complete set of results can be found in Results Review.   Recommendations:  No changes and encouraged to call if experiencing any fluid symptoms.   Follow-up plan: ICM clinic phone appointment on 03/03/2024.  91 day device clinic remote transmission 05/12/2024.      EP/Cardiology Office Visits:  Recall 02/29/2024 with Marie Fabry, PA.   Recall 07/23/2024 with Marie Ramos.     Copy of ICM check sent to Marie. Marie Ramos.  3 month ICM trend: 02/11/2024.    12-14 Month ICM trend:     Marie GORMAN Garner, RN 02/12/2024 7:14 AM

## 2024-02-15 ENCOUNTER — Telehealth: Payer: Self-pay | Admitting: Physician Assistant

## 2024-02-15 MED ORDER — BUSPIRONE HCL 5 MG PO TABS: 5.0000 mg | ORAL_TABLET | Freq: Two times a day (BID) | ORAL | 0 refills | Status: DC

## 2024-02-15 NOTE — Telephone Encounter (Signed)
 Pended to [facility], has RF at [facility].

## 2024-02-15 NOTE — Telephone Encounter (Signed)
 Pt called at 9:39a requesting refill of Buspirone  to  [facility] [address] [place] - [address] [place] [address] Phone: [phone]  Fax: [phone]    Next appt 11/17

## 2024-03-03 ENCOUNTER — Ambulatory Visit: Attending: Cardiology

## 2024-03-03 DIAGNOSIS — Z95810 Presence of automatic (implantable) cardiac defibrillator: Secondary | ICD-10-CM

## 2024-03-03 DIAGNOSIS — I5032 Chronic diastolic (congestive) heart failure: Secondary | ICD-10-CM

## 2024-03-03 NOTE — Progress Notes (Signed)
 EPIC Encounter for ICM Monitoring  Patient Name: Marie Ramos is a 68 y.o. female Date: 03/03/2024 Primary Care Physican: Okey Carlin Redbird, MD Primary Cardiologist: Wendel Electrophysiologist: Camnitz Highest weight: 268 lbs 06/09/2023 Weight: 220 lbs 08/26/2023 Weight: 210 lbs 09/02/2023 Office Weight: 210 lbs 10/02/2023 Weight: 208 lbs 12/09/2023 Weight: 208-210 lbs   Time in AT/AF  0.0 hr/day (0.0%)          Transmission results reviewed.               Diet:  Daughter helps with diet when needed and encourages low salt.         Optivol thoracic impedance suggesting normal fluid levels since 8/16 (Pt was out of Lasix  for 2-3 weeks during decreased impedance).   Prescribed:  Furosemide  40 mg Take 1 tablet (40 mg total) by mouth daily.        Labs: 04/20/2023 Creatinine 1.67, BUN 39, Potassium 4.8, Sodium 142, GFR 33 04/06/2023 Creatinine 1.96, BUN 41, Potassium 4.8, Sodium 146, GFR 28 01/07/2023 Creatinine 1.83, BUN 52, Potassium 5.2, Sodium 141, GFR 30 A complete set of results can be found in Results Review.   Recommendations:  No changes.   Follow-up plan: ICM clinic phone appointment on 04/04/2024.  91 day device clinic remote transmission 05/12/2024.      EP/Cardiology Office Visits:  Recall 02/29/2024 with [name], PA.   Recall 07/23/2024 with Dr [name].     Copy of ICM check sent to Dr. [name].  3 month ICM trend: 02/29/2024.    12-14 Month ICM trend:     [name], RN 03/03/2024 5:10 PM

## 2024-03-04 DIAGNOSIS — H2513 Age-related nuclear cataract, bilateral: Secondary | ICD-10-CM | POA: Diagnosis not present

## 2024-03-04 DIAGNOSIS — H02423 Myogenic ptosis of bilateral eyelids: Secondary | ICD-10-CM | POA: Diagnosis not present

## 2024-03-04 DIAGNOSIS — H16223 Keratoconjunctivitis sicca, not specified as Sjogren's, bilateral: Secondary | ICD-10-CM | POA: Diagnosis not present

## 2024-03-04 DIAGNOSIS — H353132 Nonexudative age-related macular degeneration, bilateral, intermediate dry stage: Secondary | ICD-10-CM | POA: Diagnosis not present

## 2024-03-10 DIAGNOSIS — E7800 Pure hypercholesterolemia, unspecified: Secondary | ICD-10-CM | POA: Diagnosis not present

## 2024-03-10 DIAGNOSIS — I10 Essential (primary) hypertension: Secondary | ICD-10-CM | POA: Diagnosis not present

## 2024-03-10 DIAGNOSIS — R7303 Prediabetes: Secondary | ICD-10-CM | POA: Diagnosis not present

## 2024-03-10 DIAGNOSIS — D473 Essential (hemorrhagic) thrombocythemia: Secondary | ICD-10-CM | POA: Diagnosis not present

## 2024-03-10 DIAGNOSIS — D75838 Other thrombocytosis: Secondary | ICD-10-CM | POA: Diagnosis not present

## 2024-03-11 DIAGNOSIS — F03A18 Unspecified dementia, mild, with other behavioral disturbance: Secondary | ICD-10-CM | POA: Diagnosis not present

## 2024-03-11 DIAGNOSIS — I10 Essential (primary) hypertension: Secondary | ICD-10-CM | POA: Diagnosis not present

## 2024-03-11 DIAGNOSIS — Z0000 Encounter for general adult medical examination without abnormal findings: Secondary | ICD-10-CM | POA: Diagnosis not present

## 2024-03-11 DIAGNOSIS — R21 Rash and other nonspecific skin eruption: Secondary | ICD-10-CM | POA: Diagnosis not present

## 2024-03-11 DIAGNOSIS — R7303 Prediabetes: Secondary | ICD-10-CM | POA: Diagnosis not present

## 2024-03-11 DIAGNOSIS — R5381 Other malaise: Secondary | ICD-10-CM | POA: Diagnosis not present

## 2024-03-11 DIAGNOSIS — E7800 Pure hypercholesterolemia, unspecified: Secondary | ICD-10-CM | POA: Diagnosis not present

## 2024-03-11 DIAGNOSIS — Z6837 Body mass index (BMI) 37.0-37.9, adult: Secondary | ICD-10-CM | POA: Diagnosis not present

## 2024-03-12 ENCOUNTER — Other Ambulatory Visit: Payer: Self-pay | Admitting: Physician Assistant

## 2024-03-28 DIAGNOSIS — R21 Rash and other nonspecific skin eruption: Secondary | ICD-10-CM | POA: Diagnosis not present

## 2024-03-28 DIAGNOSIS — S0093XA Contusion of unspecified part of head, initial encounter: Secondary | ICD-10-CM | POA: Diagnosis not present

## 2024-03-28 DIAGNOSIS — W19XXXA Unspecified fall, initial encounter: Secondary | ICD-10-CM | POA: Diagnosis not present

## 2024-03-28 DIAGNOSIS — Z6837 Body mass index (BMI) 37.0-37.9, adult: Secondary | ICD-10-CM | POA: Diagnosis not present

## 2024-04-02 NOTE — Progress Notes (Signed)
 Remote ICD Transmission

## 2024-04-04 ENCOUNTER — Encounter

## 2024-04-06 NOTE — Progress Notes (Signed)
 No ICM remote transmission received for 04/04/2024 and next ICM transmission scheduled for 04/25/2024.

## 2024-04-07 ENCOUNTER — Ambulatory Visit: Attending: Cardiology

## 2024-04-20 ENCOUNTER — Other Ambulatory Visit: Payer: Self-pay | Admitting: Physician Assistant

## 2024-04-25 ENCOUNTER — Encounter

## 2024-04-27 NOTE — Progress Notes (Signed)
 No ICM remote transmission received for 04/25/2024 and next ICM transmission scheduled for 05/13/2024.

## 2024-05-12 ENCOUNTER — Ambulatory Visit (INDEPENDENT_AMBULATORY_CARE_PROVIDER_SITE_OTHER): Payer: Medicare HMO

## 2024-05-12 DIAGNOSIS — I5032 Chronic diastolic (congestive) heart failure: Secondary | ICD-10-CM | POA: Diagnosis not present

## 2024-05-12 LAB — CUP PACEART REMOTE DEVICE CHECK
Battery Remaining Longevity: 17 mo
Battery Voltage: 2.91 V
Brady Statistic AP VP Percent: 0.02 %
Brady Statistic AP VS Percent: 8.81 %
Brady Statistic AS VP Percent: 0.09 %
Brady Statistic AS VS Percent: 91.08 %
Brady Statistic RA Percent Paced: 8.82 %
Brady Statistic RV Percent Paced: 0.11 %
Date Time Interrogation Session: 20251106043828
HighPow Impedance: 84 Ohm
Implantable Lead Connection Status: 753985
Implantable Lead Connection Status: 753985
Implantable Lead Implant Date: 20170227
Implantable Lead Implant Date: 20170227
Implantable Lead Location: 753859
Implantable Lead Location: 753860
Implantable Lead Model: 5076
Implantable Pulse Generator Implant Date: 20170227
Lead Channel Impedance Value: 361 Ohm
Lead Channel Impedance Value: 399 Ohm
Lead Channel Impedance Value: 456 Ohm
Lead Channel Pacing Threshold Amplitude: 0.625 V
Lead Channel Pacing Threshold Amplitude: 1 V
Lead Channel Pacing Threshold Pulse Width: 0.4 ms
Lead Channel Pacing Threshold Pulse Width: 0.4 ms
Lead Channel Sensing Intrinsic Amplitude: 20.125 mV
Lead Channel Sensing Intrinsic Amplitude: 20.125 mV
Lead Channel Sensing Intrinsic Amplitude: 4.125 mV
Lead Channel Sensing Intrinsic Amplitude: 4.125 mV
Lead Channel Setting Pacing Amplitude: 2 V
Lead Channel Setting Pacing Amplitude: 2.5 V
Lead Channel Setting Pacing Pulse Width: 0.4 ms
Lead Channel Setting Sensing Sensitivity: 0.3 mV
Zone Setting Status: 755011
Zone Setting Status: 755011

## 2024-05-13 ENCOUNTER — Ambulatory Visit: Attending: Cardiology

## 2024-05-13 DIAGNOSIS — Z95810 Presence of automatic (implantable) cardiac defibrillator: Secondary | ICD-10-CM

## 2024-05-13 DIAGNOSIS — I5032 Chronic diastolic (congestive) heart failure: Secondary | ICD-10-CM

## 2024-05-13 NOTE — Progress Notes (Signed)
 Remote ICD Transmission

## 2024-05-13 NOTE — Progress Notes (Signed)
 EPIC Encounter for ICM Monitoring  Patient Name: Marie Ramos is a 68 y.o. female Date: 05/13/2024 Primary Care Physican: Marie Carlin Redbird, MD Primary Cardiologist: Marie Ramos Electrophysiologist: Marie Ramos Highest weight: 268 lbs 06/09/2023 Weight: 220 lbs 08/26/2023 Weight: 210 lbs 09/02/2023 Office Weight: 210 lbs 10/02/2023 Weight: 208 lbs 12/09/2023 Weight: 208-210 lbs 03/28/2024 Weight: 206.6 lbs (per [facility] notes)   Time in AT/AF  0.0 hr/day (0.0%)          Spoke with daughter, Marie Ramos per DPR and heart failure questions reviewed.  Transmission results reviewed.  Pt is asymptomatic for fluid accumulation.  She follows up with neurologist for cognitive changes.               Diet:  Daughter helps with diet when needed and encourages low salt.         Optivol thoracic impedance suggesting normal fluid levels with the exception of possible fluid accumulation from 04/01/2024-04/12/2024 (correlates with vacation).   Prescribed:  Furosemide  40 mg Take 1 tablet (40 mg total) by mouth daily.        Labs: 03/11/2024 Creatinine 1.88, BUN 53, Potassium 5.2, Sodium 145, GFR 29 (per 03/28/2024 [facility] notes) A complete set of results can be found in Results Review.   Recommendations:  No changes and encouraged to call if pt is experiencing any fluid symptoms.   Follow-up plan: ICM clinic phone appointment on 06/19/2024.  91 day device clinic remote transmission 08/11/2024.      EP/Cardiology Office Visits:  Advised to call to make an appointment with Dr Marie Ramos office.  Recall 02/29/2024 with Marie Fabry, PA.   Recall 07/23/2024 with Dr Marie Ramos.     Copy of ICM check sent to Dr. [name].  Remote monitoring is medically necessary for Heart Failure Management.    Daily Thoracic Impedance ICM trend: 02/12/2024 through 05/13/2024.    12-14 Month Thoracic Impedance ICM trend:     Marie GORMAN Garner, RN 05/13/2024 10:38 AM

## 2024-05-17 ENCOUNTER — Ambulatory Visit: Payer: Self-pay | Admitting: Cardiology

## 2024-05-18 ENCOUNTER — Other Ambulatory Visit: Payer: Self-pay | Admitting: Physician Assistant

## 2024-05-19 NOTE — Progress Notes (Signed)
 Assessment/Plan:    Mild cognitive impairment likely of vascular etiology  Marie Ramos is a very pleasant 68 y.o. RH female with a history of CKD, prior history of cardiac arrest with anoxic brain injury 2017 status post ICD, prior history of CVA 2017, history of DVT 2018, OSA on CPAP and O2 as needed, history of bipolar disorder followed by psychiatry, anemia  and a diagnosis of MCI likely due to vascular etiology presenting today in follow-up for evaluation of memory loss.  Memory is***.  Patient is on memantine  5 mg nightly.***.     Recommendations:   Follow up in   months. Continue memantine ***milligrams, side effects discussed Repeat neuropsych evaluation in 6 to 12 months for diagnostic clarity and disease trajectory Recommend the use of CPAP for OSA and oxygen as needed Recommend good control of cardiovascular risk factors Continue to control mood as per [facility]     Subjective:   This patient is accompanied in the office by her husband***  who supplements the history. Previous records as well as any outside records available were reviewed prior to todays visit.   Patient was last seen on 11/11/2023***.    Any changes in memory since last visit? .  Her STM is***.  LTM is good. repeats oneself?  Endorsed Disoriented when walking into a room?  Patient denies ***  Misplacing objects?  Patient denies   Wandering behavior?   Denies. Any personality changes since last visit? Denies.   Any worsening depression?: denies.   Hallucinations or paranoia?  Denies.   Seizures?   Denies.    Any sleep changes? Sleeps well***. Does not sleep very well***.   Denies vivid dreams, REM behavior or sleepwalking   Sleep apnea?  Endorsed, nonadherent to the CPAP***  Any hygiene concerns?   Denies.   Independent of bathing and dressing?  Endorsed  Does the patient needs help with medications?  Husband is in charge *** Who is in charge of the finances?  Husband is in charge   *** Any changes  in appetite?  denies ***   Patient have trouble swallowing?  Denies.   Does the patient cook?  Any kitchen accidents such as leaving the stove on?   Denies.   Any headaches?    Denies.   Vision changes? Denies. Chronic pain?  Denies.   Ambulates with difficulty?  Denies.  She uses a walker for stability. ***  Recent falls or head injuries?    Denies.      Unilateral weakness, numbness or tingling?  Denies.   Any tremors?  Denies.   Any anosmia?    Denies.   Any incontinence of urine?  Urge incontinence  Any bowel dysfunction?  Due to iron she has episodes of constipation Patient lives with her husband.*** Does the patient drive?  No longer drives***     Neuropsych evaluation 10/09/2023  Briefly, given validity task performances, current testing should be interpreted with caution as obtained scores may underestimate true abilities to an unknown degree. If taken at face value, [name]'s pattern of performance is suggestive of fairly diffuse cognitive impairment. Severe impairment was exhibited surrounding processing speed, executive functioning, verbal fluency, and visuospatial abilities. Further weakness/variability was exhibited across all aspects of learning and memory. Performances were appropriate relative to age-matched peers across attention/concentration, receptive language, and confrontation naming. Functionally, her husband took over medication and financial management after her hospitalization in May 2024. While he has not returned these to her at the present time,  he did express optimism that he could at some point in the future. She does not drive and there were concerns surrounding her getting lost prior to her hospitalization. Per medical records, her daughter has expressed concern for cognitive decline for the past 6-7 years, potentially starting around the time of her cardiac arrest and stroke. Current testing patterns could reflect a primary vascular etiology given her prior  embolic stroke and potential watershed ischemia, as well as numerous chronic medical ailments which negatively impact the heart and brain. This as the primary etiology would appear most likely in my opinion. Despite memory weaknesses, she does not exhibit a classic Alzheimer's disease profile at the present time.       Initial visit 11/2022   How long did patient have memory difficulties?  For the last 6-7 years, worse over the last 2 months .  Daughter reports that the patient has difficulty recalling recent events and to a certain extent long-term not knowing that she has a history of CKD and CHF when she was recently hospitalized.  When she turned 60 we made a big party and she does not remember anything about it    Repeats oneself?  Endorsed.    Disoriented when walking into a room?  Patient denies   Leaving objects in unusual places? denies   Wandering behavior?  Patient was discharged today from [facility] but in the past, she was paranoid, packing her clothes, because she felt that they were taking pictures of her.  Any personality changes?  Patient denies  More paranoid than before Any history of depression?:  Yes she is manic depressive.  Follows with psychiatry Hallucinations ?  Patient denies   Seizures?   Patient denies    Any sleep changes?  Denies vivid dreams, REM behavior or sleepwalking   Sleep apnea?  Possibly, for a sleep study soon. She did not like the CPAP in the hospital.  Any hygiene concerns? She needs assistance.  Independent of bathing and dressing? Needs assistance.  Does the patient needs help with medications? Husband  is in charge   Who is in charge of the finances? Husband is in charge     Any changes in appetite? Denies     Patient have trouble swallowing? denies   Does the patient cook? Yes, with supervision.     Any kitchen accidents such as leaving the stove on? denies   Any headaches?   denies   Chronic  pain ?  She has arthritis knees and hands  . Had CTS in the past  Recent falls or head injuries?  She has a history of debility and frequent falls, unsteady gait  requiring PT and OT and during the last hospitalization in May 2024 for a mechanical fall she had CIR.  The falls were believed to be secondary to high-dose ox carbamazepine, as they improved with lower dose. Uses a walker for stability . She is going to have Home PT.  Vision changes? denies   Unilateral weakness, numbness or tingling? denies   Any tremors?   denies   Any anosmia?  denies   Any incontinence of urine?  She cannot make it because of mobility, not due to incontinence  Any bowel dysfunction? denies      Patient lives on rehab, but she is being discharged today.  Unfortunately, the patient needs placement at ALF but there are some insurance issues that he and her dad.-Is to have a discussion with social work at his [facility]  to try to go ahead with the placement as she needs 24/7.  History of heavy alcohol  intake? denies   History of heavy tobacco use? denies   Family history of dementia? Mother and MGF had AD  Does patient drive?No longer drives. 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          Past Medical History:  Diagnosis Date   Abnormality of gait    Acute lower UTI    Acute metabolic encephalopathy 08/16/2019   Acute on chronic diastolic CHF (congestive heart failure) 11/24/2022   Acute respiratory failure with hypoxia    AICD (automatic cardioverter/defibrillator) present 12/05/2015   AKI (acute kidney injury)    Benign essential hypertension    Bipolar I disorder in remission    Cardiac arrest 08/24/2015   concern for hypoxic encephalopathic presentation   Cerebrovascular accident (CVA) due to embolism of cerebral artery 08/2015   Scattered supra and infratentorial foci of acute ischemia measuring up to 11 mm, which may represent embolic phenomena, possible component of watershed ischemia   Chronic  kidney disease (CKD)    Chronically dry eyes    Class 3 obesity 11/29/2021   Closed fracture of left distal radius 12/01/2018   Concussion 11/2022   DVT (deep venous thrombosis) 12/05/2015   Dyspnea    r/t seasonal allergies   E. coli UTI 09/05/2015   HLD (hyperlipidemia) 12/05/2015   Hypertrophic obstructive cardiomyopathy 08/29/2015   Hypokalemia 08/29/2015   Leukocytosis    Macular degeneration    Notalgia    Obstructive sleep apnea    Pneumonia due to COVID-19 virus 08/16/2019   Pulmonary vascular congestion 08/16/2019   QT prolongation 08/29/2015   Systolic dysfunction with acute on chronic heart failure    Thrombocytosis    Ventricular fibrillation 09/03/2015   MDT ICD Dr. Kelsie     Past Surgical History:  Procedure Laterality Date   ABDOMINAL HYSTERECTOMY     CARDIAC CATHETERIZATION N/A 08/24/2015   Procedure: Left Heart Cath and Coronary Angiography;  Surgeon: Ozell Fell, MD;  Location: [facility] INVASIVE CV LAB;  Service: Cardiovascular;  Laterality: N/A;   EP IMPLANTABLE DEVICE N/A 09/03/2015   MDT dual chamber ICD   OPEN REDUCTION INTERNAL FIXATION (ORIF) DISTAL RADIAL FRACTURE Left 12/01/2018   Procedure: OPEN REDUCTION INTERNAL FIXATION (ORIF) LEFT DISTAL RADIAL FRACTURE;  Surgeon: Cristy Bonner DASEN, MD;  Location: [facility] ORS;  Service: Orthopedics;  Laterality: Left;   SPLENECTOMY, TOTAL  2011   TONSILLECTOMY       PREVIOUS MEDICATIONS:   CURRENT MEDICATIONS:  Outpatient Encounter Medications as of 05/20/2024  Medication Sig   Acetylcysteine (NAC) 600 MG CAPS Take 1 capsule (600 mg total) by mouth daily.   amLODipine  (NORVASC ) 5 MG tablet Take 1 tablet (5 mg total) by mouth daily with breakfast.   Ascorbic Acid  (VITAMIN C PO) Take 1 capsule by mouth daily.   aspirin  EC 81 MG tablet Take 1 tablet (81 mg total) by mouth daily. Swallow whole.   atorvastatin  (LIPITOR) 20 MG tablet Take 80 mg by mouth daily.   busPIRone  (BUSPAR ) 5 MG tablet Take 1 tablet (5 mg total) by  mouth 2 (two) times daily   carvedilol  (COREG ) 25 MG tablet Take 25 mg by mouth 2 (two) times daily.   cholecalciferol (VITAMIN D3) 25 MCG (1000 UNIT) tablet Take 2,000 Units by mouth daily.   empagliflozin  (JARDIANCE ) 10 MG TABS tablet Take 1 tablet (10  mg total) by mouth daily.   ezetimibe  (ZETIA ) 10 MG tablet Take 1 tablet (10 mg total) by mouth daily.   famotidine  (PEPCID ) 20 MG tablet Take 20 mg by mouth at bedtime.   ferrous sulfate 325 (65 FE) MG EC tablet Take 325 mg by mouth daily with breakfast.   fexofenadine (ALLEGRA) 180 MG tablet Take 180 mg by mouth daily.   fluticasone  (FLONASE ) 50 MCG/ACT nasal spray Place 1 spray into both nostrils daily. Use as directed   furosemide  (LASIX ) 40 MG tablet Take 1 tablet (40 mg total) by mouth daily.   losartan  (COZAAR ) 25 MG tablet Take 0.5 tablets (12.5 mg total) by mouth at bedtime.   memantine  (NAMENDA ) 5 MG tablet Take one tablet twice a day   Multiple Vitamin (MULTIVITAMIN WITH MINERALS) TABS tablet Take 1 tablet by mouth daily.   Multiple Vitamins-Minerals (OCUVITE PO) Take 1 capsule by mouth daily.   Oxcarbazepine  (TRILEPTAL ) 300 MG tablet 1/2 po q am, 1 po at bedtime.   polyethylene glycol (MIRALAX  / GLYCOLAX ) 17 g packet Take 17 g by mouth daily.   triamcinolone  ointment (KENALOG ) 0.5 % Apply 1 application topically as needed.   venlafaxine  XR (EFFEXOR -XR) 75 MG 24 hr capsule TAKE ONE CAPSULE BY MOUTH TWICE DAILY   No facility-administered encounter medications on file as of 05/20/2024.     Objective:     PHYSICAL EXAMINATION:    VITALS:  There were no vitals filed for this visit.  GEN:  The patient appears stated age and is in NAD. HEENT:  Normocephalic, atraumatic.   Neurological examination:  General: NAD, well-groomed, appears stated age. Orientation: The patient is alert. Oriented to person, place and not to date.*** Cranial nerves: There is good facial symmetry.The speech is fluent and clear. No aphasia or  dysarthria. Fund of knowledge is appropriate. Recent memory impaired and remote memory is normal.  Attention and concentration are normal.  Able to name objects and repeat phrases.  Hearing is intact to conversational tone ***.   Delayed recall *** Sensation: Sensation is intact to light touch throughout Motor: Strength is at least antigravity x4. DTR's 2/4 in UE/LE      12/26/2022   10:00 AM  Montreal Cognitive Assessment   Visuospatial/ Executive (0/5) 2  Naming (0/3) 3  Attention: Read list of digits (0/2) 2  Attention: Read list of letters (0/1) 1  Attention: Serial 7 subtraction starting at 100 (0/3) 0  Language: Repeat phrase (0/2) 1  Language : Fluency (0/1) 0  Abstraction (0/2) 1  Delayed Recall (0/5) 2  Orientation (0/6) 6  Total 18  Adjusted Score (based on education) 18        No data to display             Movement examination: Tone: There is normal tone in the UE/LE Abnormal movements:  no tremor.  No myoclonus.  No asterixis.   Coordination:  There is no decremation with RAM's. Normal finger to nose  Gait and Station: The patient has no difficulty arising out of a deep-seated chair without the use of the hands. The patient's stride length is good.  Gait is cautious and narrow.   Thank you for allowing us  the opportunity to participate in the care of this nice patient. Please do not hesitate to contact us  for any questions or concerns.   Total time spent on today's visit was *** minutes dedicated to this patient today, preparing to see patient, examining the patient, ordering tests  and/or medications and counseling the patient, documenting clinical information in the EHR or other health record, independently interpreting results and communicating results to the patient/family, discussing treatment and goals, answering patient's questions and coordinating care.  Cc:  Okey Carlin Redbird, MD  Camie Sevin 05/19/2024 5:33 AM

## 2024-05-20 ENCOUNTER — Encounter: Payer: Self-pay | Admitting: Physician Assistant

## 2024-05-20 ENCOUNTER — Ambulatory Visit: Admitting: Physician Assistant

## 2024-05-20 VITALS — BP 89/54 | HR 60 | Resp 20 | Wt 208.0 lb

## 2024-05-20 DIAGNOSIS — R413 Other amnesia: Secondary | ICD-10-CM

## 2024-05-20 DIAGNOSIS — R4189 Other symptoms and signs involving cognitive functions and awareness: Secondary | ICD-10-CM | POA: Diagnosis not present

## 2024-05-20 NOTE — Patient Instructions (Signed)
 It was a pleasure to see you today at our office.   Recommendations:  Recommend using the CPAP  Follow up in 6 months Continue memantine  5 mg twice a day   Repeat neurocognitive testing prior to my visit      RECOMMENDATIONS FOR ALL PATIENTS WITH MEMORY PROBLEMS: 1. Continue to exercise (Recommend 30 minutes of walking everyday, or 3 hours every week) 2. Increase social interactions - continue going to [place] and enjoy social gatherings with friends and family 3. Eat healthy, avoid fried foods and eat more fruits and vegetables 4. Maintain adequate blood pressure, blood sugar, and blood cholesterol level. Reducing the risk of stroke and cardiovascular disease also helps promoting better memory. 5. Avoid stressful situations. Live a simple life and avoid aggravations. Organize your time and prepare for the next day in anticipation. 6. Sleep well, avoid any interruptions of sleep and avoid any distractions in the bedroom that may interfere with adequate sleep quality 7. Avoid sugar, avoid sweets as there is a strong link between excessive sugar intake, diabetes, and cognitive impairment We discussed the Mediterranean diet, which has been shown to help patients reduce the risk of progressive memory disorders and reduces cardiovascular risk. This includes eating fish, eat fruits and green leafy vegetables, nuts like almonds and hazelnuts, walnuts, and also use olive oil. Avoid fast foods and fried foods as much as possible. Avoid sweets and sugar as sugar use has been linked to worsening of memory function.  There is always a concern of gradual progression of memory problems. If this is the case, then we may need to adjust level of care according to patient needs. Support, both to the patient and caregiver, should then be put into place.      You have been referred for a neuropsychological evaluation (i.e., evaluation of memory and thinking abilities). Please bring someone with you to this  appointment if possible, as it is helpful for the doctor to hear from both you and another adult who knows you well. Please bring eyeglasses and hearing aids if you wear them.    The evaluation will take approximately 3 hours and has two parts:   The first part is a clinical interview with the neuropsychologist (Dr. Richie or Dr. Jackquline). During the interview, the neuropsychologist will speak with you and the individual you brought to the appointment.    The second part of the evaluation is testing with the doctor's technician Neal or Luke). During the testing, the technician will ask you to remember different types of material, solve problems, and answer some questionnaires. Your family member will not be present for this portion of the evaluation.   Please note: We must reserve several hours of the neuropsychologist's time and the psychometrician's time for your evaluation appointment. As such, there is a No-Show fee of $100. If you are unable to attend any of your appointments, please contact our office as soon as possible to reschedule.    FALL PRECAUTIONS: Be cautious when walking. Scan the area for obstacles that may increase the risk of trips and falls. When getting up in the mornings, sit up at the edge of the bed for a few minutes before getting out of bed. Consider elevating the bed at the head end to avoid drop of blood pressure when getting up. Walk always in a well-lit room (use night lights in the walls). Avoid area rugs or power cords from appliances in the middle of the walkways. Use a walker or a cane  if necessary and consider physical therapy for balance exercise. Get your eyesight checked regularly.  FINANCIAL OVERSIGHT: Supervision, especially oversight when making financial decisions or transactions is also recommended.  HOME SAFETY: Consider the safety of the kitchen when operating appliances like stoves, microwave oven, and blender. Consider having supervision and share cooking  responsibilities until no longer able to participate in those. Accidents with firearms and other hazards in the house should be identified and addressed as well.   ABILITY TO BE LEFT ALONE: If patient is unable to contact 911 operator, consider using LifeLine, or when the need is there, arrange for someone to stay with patients. Smoking is a fire hazard, consider supervision or cessation. Risk of wandering should be assessed by caregiver and if detected at any point, supervision and safe proof recommendations should be instituted.  MEDICATION SUPERVISION: Inability to self-administer medication needs to be constantly addressed. Implement a mechanism to ensure safe administration of the medications.       Mediterranean Diet A Mediterranean diet refers to food and lifestyle choices that are based on the traditions of countries located on the [employer]. This way of eating has been shown to help prevent certain conditions and improve outcomes for people who have chronic diseases, like kidney disease and heart disease. What are tips for following this plan? Lifestyle  Cook and eat meals together with your family, when possible. Drink enough fluid to keep your urine clear or pale yellow. Be physically active every day. This includes: Aerobic exercise like running or swimming. Leisure activities like gardening, walking, or housework. Get 7-8 hours of sleep each night. If recommended by your health care provider, drink red wine in moderation. This means 1 glass a day for nonpregnant women and 2 glasses a day for men. A glass of wine equals 5 oz (150 mL). Reading food labels  Check the serving size of packaged foods. For foods such as rice and pasta, the serving size refers to the amount of cooked product, not dry. Check the total fat in packaged foods. Avoid foods that have saturated fat or trans fats. Check the ingredients list for added sugars, such as corn syrup. Shopping  At the  grocery store, buy most of your food from the areas near the walls of the store. This includes: Fresh fruits and vegetables (produce). Grains, beans, nuts, and seeds. Some of these may be available in unpackaged forms or large amounts (in bulk). Fresh seafood. Poultry and eggs. Low-fat dairy products. Buy whole ingredients instead of prepackaged foods. Buy fresh fruits and vegetables in-season from local farmers markets. Buy frozen fruits and vegetables in resealable bags. If you do not have access to quality fresh seafood, buy precooked frozen shrimp or canned fish, such as tuna, salmon, or sardines. Buy small amounts of raw or cooked vegetables, salads, or olives from the deli or salad bar at your store. Stock your pantry so you always have certain foods on hand, such as olive oil, canned tuna, canned tomatoes, rice, pasta, and beans. Cooking  Cook foods with extra-virgin olive oil instead of using butter or other vegetable oils. Have meat as a side dish, and have vegetables or grains as your main dish. This means having meat in small portions or adding small amounts of meat to foods like pasta or stew. Use beans or vegetables instead of meat in common dishes like chili or lasagna. Experiment with different cooking methods. Try roasting or broiling vegetables instead of steaming or sauteing them. Add frozen vegetables  to soups, stews, pasta, or rice. Add nuts or seeds for added healthy fat at each meal. You can add these to yogurt, salads, or vegetable dishes. Marinate fish or vegetables using olive oil, lemon juice, garlic, and fresh herbs. Meal planning  Plan to eat 1 vegetarian meal one day each week. Try to work up to 2 vegetarian meals, if possible. Eat seafood 2 or more times a week. Have healthy snacks readily available, such as: Vegetable sticks with hummus. Greek yogurt. Fruit and nut trail mix. Eat balanced meals throughout the week. This includes: Fruit: 2-3 servings a  day Vegetables: 4-5 servings a day Low-fat dairy: 2 servings a day Fish, poultry, or lean meat: 1 serving a day Beans and legumes: 2 or more servings a week Nuts and seeds: 1-2 servings a day Whole grains: 6-8 servings a day Extra-virgin olive oil: 3-4 servings a day Limit red meat and sweets to only a few servings a month What are my food choices? Mediterranean diet Recommended Grains: Whole-grain pasta. Brown rice. Bulgar wheat. Polenta. Couscous. Whole-wheat bread. [place]. Vegetables: Artichokes. Beets. Broccoli. Cabbage. Carrots. Eggplant. Green beans. Chard. Kale. Spinach. Onions. Leeks. Peas. Squash. Tomatoes. Peppers. Radishes. Fruits: Apples. Apricots. Avocado. Berries. Bananas. Cherries. Dates. Figs. Grapes. Lemons. Melon. Oranges. Peaches. Plums. Pomegranate. Meats and other protein foods: Beans. Almonds. Sunflower seeds. Pine nuts. Peanuts. Cod. Salmon. Scallops. Shrimp. Tuna. Tilapia. Clams. Oysters. Eggs. Dairy: Low-fat milk. Cheese. Greek yogurt. Beverages: Water. Red wine. Herbal tea. Fats and oils: Extra virgin olive oil. Avocado oil. Grape seed oil. Sweets and desserts: Greek yogurt with honey. Baked apples. Poached pears. Trail mix. Seasoning and other foods: Basil. Cilantro. Coriander. Cumin. Mint. Parsley. Sage. Rosemary. Tarragon. Garlic. Oregano. Thyme. Pepper. Balsalmic vinegar. Tahini. Hummus. Tomato sauce. Olives. Mushrooms. Limit these Grains: Prepackaged pasta or rice dishes. Prepackaged cereal with added sugar. Vegetables: Deep fried potatoes (french fries). Fruits: Fruit canned in syrup. Meats and other protein foods: Beef. Pork. Lamb. Poultry with skin. Hot dogs. [place]. Dairy: Ice cream. Sour cream. Whole milk. Beverages: Juice. Sugar-sweetened soft drinks. Beer. Liquor and spirits. Fats and oils: Butter. Canola oil. Vegetable oil. Beef fat (tallow). Lard. Sweets and desserts: Cookies. Cakes. Pies. Candy. Seasoning and other foods: Mayonnaise.  Premade sauces and marinades. The items listed may not be a complete list. Talk with your dietitian about what dietary choices are right for you. Summary The Mediterranean diet includes both food and lifestyle choices. Eat a variety of fresh fruits and vegetables, beans, nuts, seeds, and whole grains. Limit the amount of red meat and sweets that you eat. Talk with your health care provider about whether it is safe for you to drink red wine in moderation. This means 1 glass a day for nonpregnant women and 2 glasses a day for men. A glass of wine equals 5 oz (150 mL). This information is not intended to replace advice given to you by your health care provider. Make sure you discuss any questions you have with your health care provider. Document Released: 02/14/2016 Document Revised: 03/18/2016 Document Reviewed: 02/14/2016 Elsevier Interactive Patient Education  2017 [employer].      MRI at [facility]

## 2024-05-23 ENCOUNTER — Ambulatory Visit: Admitting: Physician Assistant

## 2024-05-23 ENCOUNTER — Encounter: Payer: Self-pay | Admitting: Physician Assistant

## 2024-05-23 DIAGNOSIS — G4733 Obstructive sleep apnea (adult) (pediatric): Secondary | ICD-10-CM

## 2024-05-23 DIAGNOSIS — F411 Generalized anxiety disorder: Secondary | ICD-10-CM | POA: Diagnosis not present

## 2024-05-23 DIAGNOSIS — F99 Mental disorder, not otherwise specified: Secondary | ICD-10-CM

## 2024-05-23 DIAGNOSIS — F5105 Insomnia due to other mental disorder: Secondary | ICD-10-CM

## 2024-05-23 DIAGNOSIS — F319 Bipolar disorder, unspecified: Secondary | ICD-10-CM

## 2024-05-23 MED ORDER — VENLAFAXINE HCL ER 75 MG PO CP24: 75.0000 mg | ORAL_CAPSULE | Freq: Two times a day (BID) | ORAL | 11 refills | Status: AC

## 2024-05-23 NOTE — Progress Notes (Signed)
 [facility]  Patient ID: Marie Ramos,  MRN: [id number]  PCP: Okey Carlin Redbird, MD  Date of Evaluation: 05/23/2024 Time spent:20 minutes  Chief Complaint:  Chief Complaint   Anxiety; Depression; Insomnia; Follow-up    HISTORY/CURRENT STATUS: For medication follow-up.  Husband, Georgette, is with her.  She still does not engage like she used to.  She will talk when she is spoken to but states she does not have much to say.  She has been cooking a little bit more, or at least assisting [name].  States energy and motivation are fair.    Sleeps well ok.  ADLs and personal hygiene are normal.  No change in memory. Appetite has not changed.  Weight is stable.  No complaints of anxiety or panic attacks.  No mania, delirium, AH/VH.  No SI/HI.  Individual Medical History/ Review of Systems: Changes? Marcille Rasmussen twice since LOV. Had black eyes, neg w/u, denies dizziness.  No loss of consciousness.  1 of those times she was bending over to get a glass of water and toppled over.  The other time was when she was walking over uneven pavers and her walker was unsteady and she fell. Saw neuro PA last week. Stable.   Past medications for mental health diagnoses include: Xanax , lithium caused diarrhea and increased thirst, Trileptal , Effexor  XR, Seroquel  worked well, Abilify unknown what happened, Adderall, Risperdal  helped mania but caused bilateral lower extremity edema and dry mouth.  Allergies: Sulfa antibiotics, Albuterol , Other, Sulfamethoxazole-trimethoprim, Vicodin hp [hydrocodone-acetaminophen ], Naproxen, and Vicodin [hydrocodone-acetaminophen ]  Current Medications:  Current Outpatient Medications:    amLODipine  (NORVASC ) 5 MG tablet, Take 1 tablet (5 mg total) by mouth daily with breakfast., Disp: 30 tablet, Rfl: 0   aspirin  EC 81 MG tablet, Take 1 tablet (81 mg total) by mouth daily. Swallow whole., Disp: 90 tablet, Rfl: 3   atorvastatin  (LIPITOR) 20 MG tablet, Take 80 mg by  mouth daily., Disp: , Rfl:    busPIRone  (BUSPAR ) 5 MG tablet, Take 1 tablet (5 mg total) by mouth 2 (two) times daily, Disp: 180 tablet, Rfl: 0   carvedilol  (COREG ) 25 MG tablet, Take 25 mg by mouth 2 (two) times daily., Disp: , Rfl:    cholecalciferol (VITAMIN D3) 25 MCG (1000 UNIT) tablet, Take 2,000 Units by mouth daily., Disp: , Rfl:    empagliflozin  (JARDIANCE ) 10 MG TABS tablet, Take 1 tablet (10 mg total) by mouth daily., Disp: 90 tablet, Rfl: 2   ezetimibe  (ZETIA ) 10 MG tablet, Take 1 tablet (10 mg total) by mouth daily., Disp: 90 tablet, Rfl: 2   famotidine  (PEPCID ) 20 MG tablet, Take 20 mg by mouth at bedtime., Disp: , Rfl:    ferrous sulfate 325 (65 FE) MG EC tablet, Take 325 mg by mouth daily with breakfast., Disp: , Rfl:    fexofenadine (ALLEGRA) 180 MG tablet, Take 180 mg by mouth daily., Disp: , Rfl:    fluticasone  (FLONASE ) 50 MCG/ACT nasal spray, Place 1 spray into both nostrils daily. Use as directed, Disp: , Rfl:    furosemide  (LASIX ) 40 MG tablet, Take 1 tablet (40 mg total) by mouth daily., Disp: 90 tablet, Rfl: 3   losartan  (COZAAR ) 25 MG tablet, Take 0.5 tablets (12.5 mg total) by mouth at bedtime., Disp: 45 tablet, Rfl: 3   memantine  (NAMENDA ) 5 MG tablet, Take one tablet twice a day, Disp: 180 tablet, Rfl: 3   Multiple Vitamin (MULTIVITAMIN WITH MINERALS) TABS tablet, Take 1 tablet by  mouth daily., Disp: , Rfl:    Multiple Vitamins-Minerals (OCUVITE PO), Take 1 capsule by mouth daily., Disp: , Rfl:    Oxcarbazepine  (TRILEPTAL ) 300 MG tablet, 1/2 po q am, 1 po at bedtime., Disp: 45 tablet, Rfl: 11   polyethylene glycol (MIRALAX  / GLYCOLAX ) 17 g packet, Take 17 g by mouth daily., Disp: 14 each, Rfl: 0   triamcinolone  ointment (KENALOG ) 0.5 %, Apply 1 application topically as needed., Disp: , Rfl: 0   Acetylcysteine (NAC) 600 MG CAPS, Take 1 capsule (600 mg total) by mouth daily. (Patient not taking: Reported on 05/23/2024), Disp: 30 capsule, Rfl: 11   Ascorbic Acid  (VITAMIN  C PO), Take 1 capsule by mouth daily. (Patient not taking: Reported on 05/23/2024), Disp: , Rfl:    venlafaxine  XR (EFFEXOR -XR) 75 MG 24 hr capsule, Take 1 capsule (75 mg total) by mouth 2 (two) times daily., Disp: 60 capsule, Rfl: 11 Medication Side Effects: none  Family Medical/ Social History: Changes?  None  MENTAL HEALTH EXAM:  There were no vitals taken for this visit.There is no height or weight on file to calculate BMI.  General Appearance: Casual, Well Groomed, and Obese  Eye Contact:  Good  Speech:  Clear and Coherent and Normal Rate  Volume:  Normal  Mood:  Euthymic  Affect:  Congruent       Thought Process:  Goal Directed and Descriptions of Associations: Circumstantial  Orientation:  Full (Time, Place, and Person)  Thought Content: Logical   Suicidal Thoughts:  No  Homicidal Thoughts:  No  Memory:  Immediate;   Fair Recent;   Fair Remote;   Good  Judgement:  Good  Insight:  Good  Psychomotor Activity:  Walks slowly with a walker  Concentration:  Concentration: Good and Attention Span: Good  Recall:  Good  Fund of Knowledge: Good  Language: Good  Assets:  Desire for Improvement Financial Resources/Insurance Housing Resilience Social Support Transportation  ADL's:  Intact  Cognition: WNL  Prognosis:  Good   Had neuropsychological testing for 10/09/2023  DIAGNOSES:    ICD-10-CM   1. Bipolar I disorder (HCC)  F31.9     2. Generalized anxiety disorder  F41.1     3. Insomnia due to other mental disorder  F51.05    F99     4. Obstructive sleep apnea  G47.33      Receiving Psychotherapy: No   RECOMMENDATIONS:  PDMP reviewed. Last Xanax  filled 10/24/2022. I provided approximately  20 minutes of face to face time during this encounter, including time spent before and after the visit in records review, medical decision making, counseling pertinent to today's visit, and charting.   We briefly discussed increasing the Effexor  again.  Neither of them want to  make any changes at this time but will let me know if they do in the future.  She was on a higher dose at some point and it made her feel strange so we decreased the dose sometime ago.  Encouraged her to do puzzles, call   Continue Buspar  5 mg, 1 po bid.  Continue Namenda  5 mg at bedtime, per neurology. Continue oxcarbazepine  300 mg to 1/2 pill p.o. every morning and 1 p.o. nightly. Continue  Effexor  XR 75 mg, 1 po bid.  Continue vitamins as per med list.   Return in 4 months.  Avoid drugs that are known to cause prolonged QT interval  Verneita Cooks, PA-C

## 2024-05-25 ENCOUNTER — Telehealth: Payer: Self-pay | Admitting: Physician Assistant

## 2024-05-25 NOTE — Telephone Encounter (Signed)
 Pt husband called and LM with AN. He needs a new RX adjusted to get 2 pills instead of one. Did not mention what pharmacy or medication. Needs to speak with a nurse

## 2024-05-25 NOTE — Telephone Encounter (Signed)
 Continue memantine  5 mg twice a day  please send in Rx for new directions thank you

## 2024-06-09 ENCOUNTER — Other Ambulatory Visit: Payer: Self-pay | Admitting: Physician Assistant

## 2024-06-20 ENCOUNTER — Ambulatory Visit

## 2024-06-20 DIAGNOSIS — Z95810 Presence of automatic (implantable) cardiac defibrillator: Secondary | ICD-10-CM | POA: Diagnosis not present

## 2024-06-20 DIAGNOSIS — I5032 Chronic diastolic (congestive) heart failure: Secondary | ICD-10-CM

## 2024-06-24 NOTE — Progress Notes (Signed)
 EPIC Encounter for ICM Monitoring  Patient Name: Marie Ramos is a 68 y.o. female Date: 06/24/2024 Primary Care Physican: Okey Carlin Redbird, MD Primary Cardiologist: Wendel Electrophysiologist: [name] Highest weight: 268 lbs 06/09/2023 Weight: 220 lbs 08/26/2023 Weight: 210 lbs 09/02/2023 Office Weight: 210 lbs 10/02/2023 Weight: 208 lbs 12/09/2023 Weight: 208-210 lbs 03/28/2024 Weight: 206.6 lbs (per [facility])   Time in AT/AF  0.0 hr/day (0.0%)          Spoke with daughter, [name] per DPR and heart failure questions reviewed.  Transmission results reviewed.  Pt is asymptomatic for fluid accumulation.  Reports feeling well at this time and voices no complaints.                Diet:  Daughter helps with diet when needed and encourages low salt.         Since 05/13/2024 ICM Remote Transmission: [facility] thoracic impedance suggesting normal fluid levels.   Prescribed:  Furosemide  40 mg Take 1 tablet (40 mg total) by mouth daily.        Labs: 03/11/2024 Creatinine 1.88, BUN 53, Potassium 5.2, Sodium 145, GFR 29 (per 03/28/2024 [facility] notes) A complete set of results can be found in Results Review.   Recommendations:  Left voice mail with ICM number and encouraged to call if pt is experiencing any fluid symptoms.   Follow-up plan: ICM clinic phone appointment on 07/25/2024.  91 day device clinic remote transmission 08/11/2024.      EP/Cardiology Office Visits:   07/11/2024 with [name], PA.   Recall 07/23/2024 with Dr [name].     Copy of ICM check sent to Dr. [name].  Remote monitoring is medically necessary for Heart Failure Management.    Daily Thoracic Impedance ICM trend: 03/21/2024 through 06/20/2024.    12-14 Month Thoracic Impedance ICM trend:     [name], RN 06/24/2024 8:56 AM

## 2024-07-10 NOTE — Progress Notes (Unsigned)
 " Cardiology Office Note   Date:  07/11/2024  ID:  Marie Ramos, Marie Ramos [birth year], MRN [id number] PCP: Okey Carlin Redbird, MD  [facility] Providers Cardiologist:  Lurena MARLA Red, MD Electrophysiologist:  Soyla Gladis Norton, MD   History of Present Illness Marie Ramos is a 69 y.o. female with past medical history of chronic diastolic congestive heart failure, CKD stage IV, cardiac arrest, hypertension, CVA, hyperlipidemia with LDL goal less than 55, aortic atherosclerosis, and elevated BMI here for follow-up appointment.  May 2023 the patient establish care with general cardiology.  Followed by EP for subclinical atrial fibrillation and ICD placement for ventricular fibrillation arrest thought to be due to long QT syndrome.  Patient contacted the office at that time due to shortness of breath.  She was having shortness of breath for many years.  Her weights have been stable.  Shortness of breath is not new.  Noticed a little bit more peripheral edema particular in the evenings.  Some associated with difficulty putting her shoes on.  She was not short of breath at rest.  Unsure if her shortness of breath happened when she laid down.  Denied any exertional angina, presyncope, syncope, palpitations, signs or symptoms of stroke, no severe bleeding while on Eliquis .  She has required no emergency room visits or hospitalizations.  She started Jardiance  10 mg daily as well as Lasix  20 mg twice daily.  She stopped Eliquis  due to very low atrial fibrillation burden and starting aspirin  81 mg daily.   In June 2024 she had her ICD interrogated which showed no atrial fibrillation.  Echo demonstrated preserved LV function ejection fraction of 55% with no significant valvular abnormalities.  She was seen in the division in February and was doing well.  She missed her appointment in May but was admitted at that time with heart failure and falls.  She was diuresed with Lasix  and transition to  Lasix  40 mg twice a day.  Seen by heart failure for follow-up.  Dietary discretion at SNF was noted.  Lasix  was increased to 60 mg for 2 days back then and return to 40 mg daily.  ARB, spironolactone  or Entresto was deferred given elevated creatinine.  Jardiance  was continued.  She had returned home from the facility.  Her daughter used to be a investment banker, operational.  Daughter is quite cognizant of indiscretion in regards to her diet and has modified her diet.  Patient developed cognitive impairment and was seen by neurology.  Started her on Namenda .  Denied SOB and edema had improved after pulsed dosing of Lasix  recently.  Weight was down to 246 pounds.  Weighing every day.  Denies any exertional angina or exertional dyspnea.  Has a pretty low functional capacity and does not do much on a daily basis.  No recurrent falls.  Plan was to start losartan  12.5 mg, check a BMP in 1 week and a lipid panel as well as LP(a).  She was then seen in follow-up 2/25 and her LDL in October was still above goal given her stroke history and Zetia  was added to her regimen.  No changes were made to medical regimen.  Seen by EP in January was doing well at that time.  Blood pressure well-controlled and no longer on blood thinners following decision by the EP department.  No chest pain, lightheadedness, or syncope.  Not required any emergency visits or hospitalizations since previous MI.  Taking atorvastatin  at a dose of 80 mg daily divided into 420  mg pills to achieve target LDL of less than 50 due to her history of stroke.  LDL is 94 when checked the previous October.  She experienced weight loss from to 45 pounds down to 210 pounds and attributed it to her cooking and maintaining a bland diet.  Despite the weight loss she continues a walker due to knee issues.  She monitor her oxygen level which is typically around 95% during the day.  No significant leg swelling.  No fevers, chills, nausea, or vomiting.  She reported feeling sleepier in the  morning which she attributes to taking 9 pills at that time.   Today, she presents with a hx of atrial fibrillation and a history of ventricular fibrillation arrest who presents for cardiovascular management and medication adjustment.  She has atrial fibrillation and prior ventricular fibrillation arrest managed with an ICD. She takes carvedilol  25 mg twice daily, amlodipine  2.5 mg, and losartan  12.5 mg. Her blood pressure has been consistently low, with readings of 87/55 mmHg in May and 89/54 mmHg in November. She has not noticed irregular heartbeats, skipped beats, or chest fluttering.  She has chronic kidney disease.  She takes Lasix  40 mg daily and Jardiance  10 mg for fluid and blood sugar control. She feels thirsty and monitors fluid intake. She has lost over 40 pounds and her shortness of breath has improved. She previously could not walk down the hallway without stopping but now walks the hallway without stopping.  Her cardiovascular medications include atorvastatin  (Lipitor) 80 mg at dinnertime, Zetia  10 mg, and aspirin  along with multiple morning medications, which she finds tiring.  She has had a stroke. Her caregiver notes she is not exercising enough. Her LDL was 79 mg/dL in September while on high-dose Lipitor and Zetia .  Reports no shortness of breath nor dyspnea on exertion. Reports no chest pain, pressure, or tightness. No edema, orthopnea, PND. Reports no palpitations.   Discussed the use of AI scribe software for clinical note transcription with the patient, who gave verbal consent to proceed.    ROS: pertinent ROS in HPI  Studies Reviewed EKG Interpretation Date/Time:  Monday July 11 2024 09:51:23 EST Ventricular Rate:  62 PR Interval:  224 QRS Duration:  92 QT Interval:  410 QTC Calculation: 416 R Axis:   24  Text Interpretation: Sinus rhythm with sinus arrhythmia with 1st degree A-V block When compared with ECG of 29-Jul-2023 10:15, T wave inversion more evident  in Lateral leads Confirmed by Lucien Blanc [phone]) on 07/11/2024 12:58:25 PM     Echo 09/04/22 IMPRESSIONS     1. Left ventricular ejection fraction, by estimation, is 55%. The left  ventricle has normal function. Left ventricular endocardial border not  optimally defined to evaluate regional wall motion despite use of Definity   contrast. There is severe left  ventricular hypertrophy of the septal segment (15 mm), and otherwise wall  thickness is not well visualized. No left ventricular outflow tract  obstruction demonstrated. Left ventricular diastolic parameters are  consistent with Grade I diastolic  dysfunction (impaired relaxation).   2. Right ventricular systolic function is normal. The right ventricular  size is normal. Tricuspid regurgitation signal is inadequate for assessing  PA pressure.   3. The mitral valve is degenerative. Trivial mitral valve regurgitation.  No evidence of mitral stenosis.   4. The aortic valve was not well visualized. There is mild calcification  of the aortic valve. Aortic valve regurgitation is trivial. Aortic valve  sclerosis/calcification is present, without any evidence  of aortic  stenosis.   5. The inferior vena cava is normal in size with greater than 50%  respiratory variability, suggesting right atrial pressure of 3 mmHg.   FINDINGS   Left Ventricle: Left ventricular ejection fraction, by estimation, is  55%. The left ventricle has normal function. Left ventricular endocardial  border not optimally defined to evaluate regional wall motion. Definity   contrast agent was given IV to  delineate the left ventricular endocardial borders. The left ventricular  internal cavity size was normal in size. There is severe left ventricular  hypertrophy of the septal segment. Left ventricular diastolic parameters  are consistent with Grade I  diastolic dysfunction (impaired relaxation).   Right Ventricle: The right ventricular size is normal. No increase  in  right ventricular wall thickness. Right ventricular systolic function is  normal. Tricuspid regurgitation signal is inadequate for assessing PA  pressure.   Left Atrium: Left atrial size was normal in size.   Right Atrium: Right atrial size was normal in size.   Pericardium: There is no evidence of pericardial effusion.   Mitral Valve: The mitral valve is degenerative in appearance. Mild to  moderate mitral annular calcification. Trivial mitral valve regurgitation.  No evidence of mitral valve stenosis.   Tricuspid Valve: The tricuspid valve is normal in structure. Tricuspid  valve regurgitation is trivial. No evidence of tricuspid stenosis.   Aortic Valve: The aortic valve was not well visualized. There is mild  calcification of the aortic valve. Aortic valve regurgitation is trivial.  Aortic valve sclerosis/calcification is present, without any evidence of  aortic stenosis.   Pulmonic Valve: The pulmonic valve was not well visualized. Pulmonic valve  regurgitation is not visualized. No evidence of pulmonic stenosis.   Aorta: The aortic root is normal in size and structure and the ascending  aorta was not well visualized. Ascending aorta measurements are within  normal limits for age when indexed to body surface area.   Venous: The inferior vena cava is normal in size with greater than 50%  respiratory variability, suggesting right atrial pressure of 3 mmHg.   IAS/Shunts: The atrial septum is grossly normal.   Additional Comments: A device lead is visualized in the right atrium and  right ventricle.       Physical Exam VS:  BP (!) 95/57   Pulse 62   Ht 5' 2 (1.575 m)   Wt 211 lb (95.7 kg)   SpO2 94%   BMI 38.59 kg/m        Wt Readings from Last 3 Encounters:  07/11/24 211 lb (95.7 kg)  05/20/24 208 lb (94.3 kg)  09/02/23 210 lb 6.4 oz (95.4 kg)    GEN: Well nourished, well developed in no acute distress NECK: No JVD; No carotid bruits CARDIAC: RRR, no  murmurs, rubs, gallops RESPIRATORY:  Clear to auscultation without rales, wheezing or rhonchi  ABDOMEN: Soft, non-tender, non-distended EXTREMITIES:  No edema; No deformity   ASSESSMENT AND PLAN  Chronic diastolic heart failure Managed with carvedilol , Lasix , and Jardiance . No recent arrhythmias. Improved dyspnea with weight loss. - Continue carvedilol , Lasix , and Jardiance . - Monitor blood pressure daily for two weeks, one hour after morning and evening medications. - Submit blood pressure log via MyChart or phone call after two weeks.  Atrial fibrillation Managed with carvedilol . No recent episodes. Carvedilol  preferred for rate and blood pressure control. - Continue carvedilol .  History of ventricular fibrillation cardiac arrest with ICD in situ ICD in place. Battery life sufficient until next  year. Regular remote checks by electrophysiologist. - Scheduled appointment with electrophysiologist Dr. Meta for ICD evaluation and battery status.  Nonischemic cardiomyopathy Previous ventricular hypertrophy likely due to uncontrolled hypertension. Weight loss and blood pressure control may improve function. - Continue current management and monitor cardiac function.  Chronic kidney disease stage 4 Managed with losartan , now discontinued due to hypotension. Carvedilol  preferred for renal protection. - Discontinued losartan . - Monitor kidney function and blood pressure.  Essential hypertension Blood pressure low due to medication. Amlodipine  and losartan  discontinued to prevent renal perfusion issues. Carvedilol  continued. - Discontinued amlodipine  and losartan . - Continue carvedilol . - Monitor blood pressure daily for two weeks.  Hyperlipidemia Managed with atorvastatin  and Zetia . LDL slightly above target. Lifestyle changes recommended. - Continue atorvastatin  and Zetia . - Encouraged dietary modifications and increased physical activity.  History of cerebrovascular  accident Statin therapy for secondary prevention. - Continue atorvastatin .      Dispo: next available with Dr. [name], 6 months with Dr. Wendel  Signed, Orren LOISE Fabry, PA-C   "

## 2024-07-11 ENCOUNTER — Ambulatory Visit: Attending: Cardiology | Admitting: Physician Assistant

## 2024-07-11 VITALS — BP 95/57 | HR 62 | Ht 62.0 in | Wt 211.0 lb

## 2024-07-11 DIAGNOSIS — I639 Cerebral infarction, unspecified: Secondary | ICD-10-CM | POA: Diagnosis not present

## 2024-07-11 DIAGNOSIS — I469 Cardiac arrest, cause unspecified: Secondary | ICD-10-CM

## 2024-07-11 DIAGNOSIS — E785 Hyperlipidemia, unspecified: Secondary | ICD-10-CM

## 2024-07-11 DIAGNOSIS — Z6841 Body Mass Index (BMI) 40.0 and over, adult: Secondary | ICD-10-CM

## 2024-07-11 DIAGNOSIS — Z79899 Other long term (current) drug therapy: Secondary | ICD-10-CM | POA: Diagnosis not present

## 2024-07-11 DIAGNOSIS — I10 Essential (primary) hypertension: Secondary | ICD-10-CM

## 2024-07-11 DIAGNOSIS — I700 Atherosclerosis of aorta: Secondary | ICD-10-CM | POA: Diagnosis not present

## 2024-07-11 DIAGNOSIS — N184 Chronic kidney disease, stage 4 (severe): Secondary | ICD-10-CM | POA: Diagnosis not present

## 2024-07-11 DIAGNOSIS — I428 Other cardiomyopathies: Secondary | ICD-10-CM

## 2024-07-11 DIAGNOSIS — I5032 Chronic diastolic (congestive) heart failure: Secondary | ICD-10-CM

## 2024-07-11 DIAGNOSIS — Z95810 Presence of automatic (implantable) cardiac defibrillator: Secondary | ICD-10-CM | POA: Diagnosis not present

## 2024-07-11 MED ORDER — EMPAGLIFLOZIN 10 MG PO TABS: 10.0000 mg | ORAL_TABLET | Freq: Every day | ORAL | 2 refills | Status: AC

## 2024-07-11 NOTE — Patient Instructions (Signed)
 Medication Instructions:  STOP Losartan  (cozaar ) STOP Norvasc  (amlodipine ) *If you need a refill on your cardiac medications before your next appointment, please call your pharmacy*  Lab Work: TODAY-BMET If you have labs (blood work) drawn today and your tests are completely normal, you will receive your results only by: MyChart Message (if you have MyChart) OR A paper copy in the mail If you have any lab test that is abnormal or we need to change your treatment, we will call you to review the results.  Testing/Procedures: NONE ORDERED  Follow-Up: At [facility], you and your health needs are our priority.  As part of our continuing mission to provide you with exceptional heart care, our providers are all part of one team.  This team includes your primary Cardiologist (physician) and Advanced Practice Providers or APPs (Physician Assistants and Nurse Practitioners) who all work together to provide you with the care you need, when you need it.  Your next appointment:   6 month(s)  Provider:   Arun K Thukkani, MD or [name], PA   NEEDS AN APPT WITH DR CAMNITZ FIRST AVAILABLE    We recommend signing up for the patient portal called MyChart.  Sign up information is provided on this After Visit Summary.  MyChart is used to connect with patients for Virtual Visits (Telemedicine).  Patients are able to view lab/test results, encounter notes, upcoming appointments, etc.  Non-urgent messages can be sent to your provider as well.   To learn more about what you can do with MyChart, go to [email or web address].   Other Instructions Please check your blood pressure twice a day for two weeks, then contact the office with your readings  Please contact the office with your readings either by phone, by dropping it off in person, or by sending it through MyChart.   Be sure to check your blood pressure one to two hours after taking your medications.  Avoid the following for 30  minutes before checking your blood pressure: No caffeine No alcohol  No eating No smoking  No exercise  Five minutes before checking your blood pressure: Use the restroom Sit up straight in a chair with your back supported and feet flat on the floor Remain quiet and do not talk

## 2024-07-12 LAB — BASIC METABOLIC PANEL WITH GFR
BUN/Creatinine Ratio: 24 (ref 12–28)
BUN: 47 mg/dL — ABNORMAL HIGH (ref 8–27)
CO2: 25 mmol/L (ref 20–29)
Calcium: 9.7 mg/dL (ref 8.7–10.3)
Chloride: 105 mmol/L (ref 96–106)
Creatinine, Ser: 1.95 mg/dL — ABNORMAL HIGH (ref 0.57–1.00)
Glucose: 73 mg/dL (ref 70–99)
Potassium: 5 mmol/L (ref 3.5–5.2)
Sodium: 145 mmol/L — ABNORMAL HIGH (ref 134–144)
eGFR: 28 mL/min/1.73 — ABNORMAL LOW

## 2024-07-13 ENCOUNTER — Telehealth: Payer: Self-pay | Admitting: Physician Assistant

## 2024-07-13 ENCOUNTER — Ambulatory Visit: Payer: Self-pay | Admitting: Physician Assistant

## 2024-07-13 ENCOUNTER — Ambulatory Visit: Admitting: Physician Assistant

## 2024-07-13 NOTE — Telephone Encounter (Signed)
 Spoke with patient regarding BP log for this morning and yesterday. [name], PA-C had stopped her blood pressure medications Monday (1/5) and requested BP log. Pt thought she had to update us  daily with her blood pressures. I explained to patient that it had only been 2 days and to call back next Monday with at least a week of blood pressures, but to check them twice daily. Pt having no symptoms of dizziness, lightheadedness etc. Asymptomatic at this time. Pt advised to call 911 or go to the Emergency Room if this changes. Will update [name], PA-C with readings for now. Pt verbalizes understanding of plan.

## 2024-07-13 NOTE — Telephone Encounter (Signed)
 Pt calling to report BP:   1/6  98/59 94 100/59 94  1/7  97/60 95

## 2024-07-25 ENCOUNTER — Ambulatory Visit: Attending: Cardiology

## 2024-07-25 DIAGNOSIS — Z95810 Presence of automatic (implantable) cardiac defibrillator: Secondary | ICD-10-CM

## 2024-07-25 DIAGNOSIS — I5032 Chronic diastolic (congestive) heart failure: Secondary | ICD-10-CM | POA: Diagnosis not present

## 2024-07-28 ENCOUNTER — Telehealth: Payer: Self-pay

## 2024-07-28 NOTE — Telephone Encounter (Signed)
 Remote ICM transmission received.  Attempted call to patient regarding ICM remote transmission and no answer.

## 2024-07-28 NOTE — Progress Notes (Signed)
 EPIC Encounter for ICM Monitoring  Patient Name: Marie Ramos is a 69 y.o. female Date: 07/28/2024 Primary Care Physican: Okey Carlin Redbird, MD Primary Cardiologist: Wendel Electrophysiologist: [name] Highest weight: 268 lbs 06/09/2023 Weight: 220 lbs 08/26/2023 Weight: 210 lbs 09/02/2023 Office Weight: 210 lbs 10/02/2023 Weight: 208 lbs 12/09/2023 Weight: 208-210 lbs 03/28/2024 Weight: 206.6 lbs (per [facility]) 07/11/2024 Office Weight: 211 lbs   Time in AT/AF  0.0 hr/day (0.0%)          SAttempted call to daughter [name] and unable to reach.   Transmission results reviewed.                 Diet:  Daughter helps with diet when needed and encourages low salt.         Since 06/20/2024 ICM Remote Transmission: Optivol thoracic impedance suggesting normal fluid levels.   Prescribed:  Furosemide  40 mg Take 1 tablet (40 mg total) by mouth daily.        Labs: 07/11/2024 Creatinine 1.95, BUN 47, Potassium 5.0, Sodium 145, GFR 28 A complete set of results can be found in Results Review.   Recommendations:  Unable to reach.     Follow-up plan: ICM clinic phone appointment on 08/29/2024.  91 day device clinic remote transmission 08/11/2024.      EP/Cardiology Office Visits:  10/10/2024 with Dr [name].     Copy of ICM check sent to Dr. [name].  Remote monitoring is medically necessary for Heart Failure Management.    Daily Thoracic Impedance ICM trend: 04/25/2024 through 07/25/2024.    12-14 Month Thoracic Impedance ICM trend:     [name], RN 07/28/2024 12:09 PM

## 2024-07-29 ENCOUNTER — Other Ambulatory Visit: Payer: Self-pay | Admitting: Physician Assistant

## 2024-08-04 NOTE — Progress Notes (Signed)
 31 day ICM Remote transmission canceled due to [facility] clinic is on hold until further notice.  91 day remote monitoring will continue per protocol.

## 2024-08-11 ENCOUNTER — Ambulatory Visit: Payer: Medicare HMO

## 2024-08-12 LAB — CUP PACEART REMOTE DEVICE CHECK
Battery Remaining Longevity: 14 mo
Battery Voltage: 2.9 V
Brady Statistic AP VP Percent: 0.01 %
Brady Statistic AP VS Percent: 8.45 %
Brady Statistic AS VP Percent: 0.07 %
Brady Statistic AS VS Percent: 91.46 %
Brady Statistic RA Percent Paced: 8.47 %
Brady Statistic RV Percent Paced: 0.08 %
Date Time Interrogation Session: 20260205031604
HighPow Impedance: 95 Ohm
Implantable Lead Connection Status: 753985
Implantable Lead Connection Status: 753985
Implantable Lead Implant Date: 20170227
Implantable Lead Implant Date: 20170227
Implantable Lead Location: 753859
Implantable Lead Location: 753860
Implantable Lead Model: 5076
Implantable Pulse Generator Implant Date: 20170227
Lead Channel Impedance Value: 399 Ohm
Lead Channel Impedance Value: 418 Ohm
Lead Channel Impedance Value: 418 Ohm
Lead Channel Pacing Threshold Amplitude: 0.5 V
Lead Channel Pacing Threshold Amplitude: 0.875 V
Lead Channel Pacing Threshold Pulse Width: 0.4 ms
Lead Channel Pacing Threshold Pulse Width: 0.4 ms
Lead Channel Sensing Intrinsic Amplitude: 2.25 mV
Lead Channel Sensing Intrinsic Amplitude: 2.25 mV
Lead Channel Sensing Intrinsic Amplitude: 23.5 mV
Lead Channel Sensing Intrinsic Amplitude: 23.5 mV
Lead Channel Setting Pacing Amplitude: 2 V
Lead Channel Setting Pacing Amplitude: 2.5 V
Lead Channel Setting Pacing Pulse Width: 0.4 ms
Lead Channel Setting Sensing Sensitivity: 0.3 mV
Zone Setting Status: 755011
Zone Setting Status: 755011

## 2024-08-29 ENCOUNTER — Ambulatory Visit

## 2024-09-20 ENCOUNTER — Ambulatory Visit: Admitting: Physician Assistant

## 2024-10-10 ENCOUNTER — Ambulatory Visit: Admitting: Cardiology

## 2024-11-17 ENCOUNTER — Institutional Professional Consult (permissible substitution): Admitting: Psychology

## 2024-11-17 ENCOUNTER — Ambulatory Visit: Payer: Self-pay

## 2024-11-24 ENCOUNTER — Encounter: Payer: Self-pay | Admitting: Psychology

## 2024-11-24 ENCOUNTER — Ambulatory Visit: Payer: Self-pay | Admitting: Physician Assistant
